# Patient Record
Sex: Male | Born: 1975 | Race: White | Hispanic: Yes | Marital: Single | Smoking: Current some day smoker
Health system: Southern US, Community
[De-identification: ages and names within clinical notes are randomized; demographics above are authoritative.]

## PROBLEM LIST (undated history)

## (undated) DIAGNOSIS — D509 Iron deficiency anemia, unspecified: Secondary | ICD-10-CM

## (undated) DIAGNOSIS — J45909 Unspecified asthma, uncomplicated: Secondary | ICD-10-CM

## (undated) DIAGNOSIS — G4730 Sleep apnea, unspecified: Secondary | ICD-10-CM

## (undated) DIAGNOSIS — K7030 Alcoholic cirrhosis of liver without ascites: Secondary | ICD-10-CM

## (undated) DIAGNOSIS — R740 Nonspecific elevation of levels of transaminase and lactic acid dehydrogenase [LDH]: Secondary | ICD-10-CM

## (undated) DIAGNOSIS — I861 Scrotal varices: Secondary | ICD-10-CM

## (undated) DIAGNOSIS — K700 Alcoholic fatty liver: Secondary | ICD-10-CM

## (undated) DIAGNOSIS — F1020 Alcohol dependence, uncomplicated: Secondary | ICD-10-CM

## (undated) HISTORY — DX: Alcoholic fatty liver: K70.0

## (undated) HISTORY — DX: Sleep apnea, unspecified: G47.30

## (undated) HISTORY — DX: Nonspecific elevation of levels of transaminase and lactic acid dehydrogenase (ldh): R74.0

## (undated) HISTORY — DX: Iron deficiency anemia, unspecified: D50.9

## (undated) HISTORY — DX: Morbid (severe) obesity due to excess calories: E66.01

## (undated) HISTORY — DX: Alcohol dependence, uncomplicated: F10.20

## (undated) HISTORY — DX: Alcoholic cirrhosis of liver without ascites: K70.30

## (undated) HISTORY — DX: Unspecified asthma, uncomplicated: J45.909

## (undated) HISTORY — DX: Scrotal varices: I86.1

---

## 2005-03-14 ENCOUNTER — Encounter: Admission: RE | Admit: 2005-03-14 | Discharge: 2005-03-14 | Payer: Self-pay | Admitting: Gastroenterology

## 2007-06-10 DIAGNOSIS — R7401 Elevation of levels of liver transaminase levels: Secondary | ICD-10-CM

## 2007-06-10 HISTORY — DX: Elevation of levels of liver transaminase levels: R74.01

## 2007-10-10 ENCOUNTER — Emergency Department (HOSPITAL_COMMUNITY): Admission: EM | Admit: 2007-10-10 | Discharge: 2007-10-10 | Payer: Self-pay | Admitting: Emergency Medicine

## 2007-10-12 ENCOUNTER — Encounter: Payer: Self-pay | Admitting: Internal Medicine

## 2007-10-12 ENCOUNTER — Ambulatory Visit: Payer: Self-pay | Admitting: Infectious Disease

## 2007-10-12 DIAGNOSIS — J309 Allergic rhinitis, unspecified: Secondary | ICD-10-CM

## 2007-10-12 LAB — CONVERTED CEMR LAB: Hgb A1c MFr Bld: 4.8 %

## 2007-10-21 LAB — CONVERTED CEMR LAB
ALT: 146 units/L — ABNORMAL HIGH (ref 0–53)
AST: 89 units/L — ABNORMAL HIGH (ref 0–37)
Albumin: 4.8 g/dL (ref 3.5–5.2)
Alkaline Phosphatase: 91 units/L (ref 39–117)
BUN: 13 mg/dL (ref 6–23)
Basophils Absolute: 0 10*3/uL (ref 0.0–0.1)
Basophils Relative: 0 % (ref 0–1)
Bilirubin Urine: NEGATIVE
CO2: 26 meq/L (ref 19–32)
Calcium: 10.1 mg/dL (ref 8.4–10.5)
Chloride: 103 meq/L (ref 96–112)
Cholesterol: 236 mg/dL — ABNORMAL HIGH (ref 0–200)
Creatinine, Ser: 1.03 mg/dL (ref 0.40–1.50)
Eosinophils Absolute: 0 10*3/uL (ref 0.0–0.7)
Eosinophils Relative: 0 % (ref 0–5)
Glucose, Bld: 125 mg/dL — ABNORMAL HIGH (ref 70–99)
HCT: 50.2 % (ref 39.0–52.0)
HDL: 58 mg/dL (ref >39)
Hemoglobin, Urine: NEGATIVE
Hemoglobin: 17.3 g/dL — ABNORMAL HIGH (ref 13.0–17.0)
Ketones, ur: NEGATIVE mg/dL
LDL Cholesterol: 141 mg/dL — ABNORMAL HIGH (ref 0–99)
Leukocytes, UA: NEGATIVE
Lymphocytes Relative: 13 % (ref 12–46)
Lymphs Abs: 1.4 10*3/uL (ref 0.7–4.0)
MCHC: 34.5 g/dL (ref 30.0–36.0)
MCV: 89.2 fL (ref 78.0–100.0)
Monocytes Absolute: 0.3 10*3/uL (ref 0.1–1.0)
Monocytes Relative: 3 % (ref 3–12)
Neutro Abs: 9.7 10*3/uL — ABNORMAL HIGH (ref 1.7–7.7)
Neutrophils Relative %: 85 % — ABNORMAL HIGH (ref 43–77)
Nitrite: NEGATIVE
Platelets: 331 10*3/uL (ref 150–400)
Potassium: 4.7 meq/L (ref 3.5–5.3)
Protein, ur: NEGATIVE mg/dL
RBC: 5.63 M/uL (ref 4.22–5.81)
RDW: 12.8 % (ref 11.5–15.5)
Sodium: 141 meq/L (ref 135–145)
Specific Gravity, Urine: 1.023 (ref 1.005–1.03)
TSH: 0.767 microintl units/mL (ref 0.350–5.50)
Total Bilirubin: 0.7 mg/dL (ref 0.3–1.2)
Total CHOL/HDL Ratio: 4.1
Total Protein: 7.9 g/dL (ref 6.0–8.3)
Triglycerides: 185 mg/dL — ABNORMAL HIGH (ref <150)
Urine Glucose: NEGATIVE mg/dL
Urobilinogen, UA: 1 (ref 0.0–1.0)
VLDL: 37 mg/dL (ref 0–40)
WBC: 11.5 10*3/uL — ABNORMAL HIGH (ref 4.0–10.5)
pH: 7.5 (ref 5.0–8.0)

## 2009-10-03 ENCOUNTER — Telehealth (INDEPENDENT_AMBULATORY_CARE_PROVIDER_SITE_OTHER): Payer: Self-pay | Admitting: *Deleted

## 2009-10-03 ENCOUNTER — Encounter (INDEPENDENT_AMBULATORY_CARE_PROVIDER_SITE_OTHER): Payer: Self-pay | Admitting: *Deleted

## 2010-01-07 DIAGNOSIS — I861 Scrotal varices: Secondary | ICD-10-CM

## 2010-01-07 HISTORY — DX: Scrotal varices: I86.1

## 2010-01-16 ENCOUNTER — Ambulatory Visit: Payer: Self-pay | Admitting: Internal Medicine

## 2010-01-16 DIAGNOSIS — K700 Alcoholic fatty liver: Secondary | ICD-10-CM

## 2010-01-16 DIAGNOSIS — I861 Scrotal varices: Secondary | ICD-10-CM | POA: Insufficient documentation

## 2010-01-16 DIAGNOSIS — K760 Fatty (change of) liver, not elsewhere classified: Secondary | ICD-10-CM | POA: Insufficient documentation

## 2010-01-16 HISTORY — DX: Alcoholic fatty liver: K70.0

## 2010-01-17 ENCOUNTER — Encounter: Payer: Self-pay | Admitting: Internal Medicine

## 2010-01-17 LAB — CONVERTED CEMR LAB
ALT: 136 units/L — ABNORMAL HIGH (ref 0–53)
AST: 80 units/L — ABNORMAL HIGH (ref 0–37)
Albumin: 4.7 g/dL (ref 3.5–5.2)
Alkaline Phosphatase: 87 units/L (ref 39–117)
BUN: 11 mg/dL (ref 6–23)
Basophils Absolute: 0 10*3/uL (ref 0.0–0.1)
Basophils Relative: 0 % (ref 0–1)
CO2: 25 meq/L (ref 19–32)
Calcium: 9.4 mg/dL (ref 8.4–10.5)
Chloride: 102 meq/L (ref 96–112)
Cholesterol: 209 mg/dL — ABNORMAL HIGH (ref 0–200)
Creatinine, Ser: 0.93 mg/dL (ref 0.40–1.50)
Eosinophils Absolute: 0.4 10*3/uL (ref 0.0–0.7)
Eosinophils Relative: 6 % — ABNORMAL HIGH (ref 0–5)
Glucose, Bld: 89 mg/dL (ref 70–99)
HCT: 46.6 % (ref 39.0–52.0)
HCV Ab: NEGATIVE
HDL: 45 mg/dL (ref >39)
Hemoglobin: 16.2 g/dL (ref 13.0–17.0)
Hep A IgM: NEGATIVE
Hep B C IgM: NEGATIVE
Hepatitis B Surface Ag: NEGATIVE
LDL Cholesterol: 135 mg/dL — ABNORMAL HIGH (ref 0–99)
Lymphocytes Relative: 36 % (ref 12–46)
Lymphs Abs: 2.8 10*3/uL (ref 0.7–4.0)
MCHC: 34.8 g/dL (ref 30.0–36.0)
MCV: 89.3 fL (ref >=78.0)
Monocytes Absolute: 0.7 10*3/uL (ref 0.1–1.0)
Monocytes Relative: 8 % (ref 3–12)
Neutro Abs: 3.9 10*3/uL (ref 1.7–7.7)
Neutrophils Relative %: 50 % (ref 43–77)
Platelets: 269 10*3/uL (ref 150–400)
Potassium: 4.4 meq/L (ref 3.5–5.3)
RBC: 5.22 M/uL (ref 4.22–5.81)
RDW: 12.4 % (ref 11.5–15.5)
Sodium: 140 meq/L (ref 135–145)
TSH: 2.136 microintl units/mL (ref 0.350–4.5)
Total Bilirubin: 0.9 mg/dL (ref 0.3–1.2)
Total CHOL/HDL Ratio: 4.6
Total Protein: 7.6 g/dL (ref 6.0–8.3)
Triglycerides: 145 mg/dL (ref <150)
VLDL: 29 mg/dL (ref 0–40)
WBC: 7.7 10*3/uL (ref 4.0–10.5)
aPTT: 31 s

## 2010-01-21 ENCOUNTER — Ambulatory Visit (HOSPITAL_COMMUNITY): Admission: RE | Admit: 2010-01-21 | Discharge: 2010-01-21 | Payer: Self-pay | Admitting: Internal Medicine

## 2010-01-29 ENCOUNTER — Encounter: Payer: Self-pay | Admitting: Internal Medicine

## 2010-07-09 NOTE — Letter (Signed)
Summary: [facility] RECALL LETTER  All     ,     Phone:   Fax:     10/03/2009   [facility] [address] [place], [place]  [address]   Dear  [name],   You are due to follow-up with a doctor at [facility].  We have been unable to contact you by phone.  If you would like to schedule a visit, please call [phone].  If you are receiving your health care somewhere else, please call us and we will take your name off our patient list.  Healthy regards,  Raynaldo Opitz, Director [facility]

## 2010-07-09 NOTE — Assessment & Plan Note (Signed)
Summary: EST-ROUTINE CHECKUP/CH   Vital Signs:  Patient profile:   35 year old male Height:      62 inches (157.48 cm) Weight:      244.8 pounds (111.27 kg) BMI:     44.94 Temp:     97.3 degrees F Pulse rate:   60 / minute BP sitting:   124 / 78  (left arm) Cuff size:   large  Vitals Entered By: Dorie Rank RN (January 16, 2010 4:26 PM) CC: check up Is Patient Diabetic? No Pain Assessment Patient in pain? no      Nutritional Status BMI of > 30 = obese  Does patient need assistance? Functional Status Self care Ambulation Normal   Primary Care :  Vassie Loll MD  CC:  check up.  History of Present Illness: 35 y/o male with pmh as described in the EMR; who comes to the clinic for followup and also to be evaluated for left testis swelling and inttermitent pain.  Patient is currently doing well and denies CP, abdominal pain, nausea, vomiting, or any other complaints.  He endorses some depressed mood and also increased alcohol consumption; he also stop following a low calorie diet and exercising and is currently with aprox 20 pounds weight gain.  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) 4)  Healthy Adult Male  (ICD-V70.0) 5)  Screening For Lipoid Disorders  (ICD-V77.91) 6)  Family History Diabetes 1st Degree Relative  (ICD-V18.0) 7)  Allergic Rhinitis  (ICD-477.9)  Current Problems (verified): 1)  Healthy Adult Male  (ICD-V70.0) 2)  Screening For Lipoid Disorders  (ICD-V77.91) 3)  Family History Diabetes 1st Degree Relative   (ICD-V18.0) 4)  Allergic Rhinitis  (ICD-477.9)  Medications Prior to Update: 1)  None  Current Medications (verified): 1)  None  Allergies (verified): No Known Drug Allergies  Past History:  Past Medical History: Last updated: 10/12/2007 Allergic rhinitis  Family History: Last updated: 10/12/2007 Family History Liver disease (his dad) Family History Diabetes 1st degree relative  Social History: 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 Single hx of ocassionally cigar smoking in the past (quit 2 years ago) Alcohol use-yes Regular exercise-yes  Risk Factors: Caffeine Use: 4-5 (01/16/2010) Exercise: yes (01/16/2010)  Risk Factors: Smoking Status: never (01/16/2010) Passive Smoke Exposure: yes (01/16/2010)  Review of Systems       The patient complains of weight gain and testicular masses.  The patient denies anorexia, fever, chest pain, syncope, dyspnea on exertion, peripheral edema, prolonged cough, hemoptysis, abdominal pain, melena, hematochezia, and severe indigestion/heartburn.    Physical Exam  General:  alert, well-hydrated, appropriate dress, cooperative to examination, good hygiene, and overweight-appearing.   Lungs:  Normal respiratory effort, chest expands symmetrically. Lungs are clear to auscultation, no crackles or wheezes. Heart:  Normal rate and regular rhythm. S1 and S2 normal without gallop, murmur, click, rub or other extra sounds. Abdomen:  Bowel sounds positive,abdomen soft, obese and non-tender,  without masses, organomegaly or hernias noted. Genitalia:  uncircumcised, no cutaneous lesions, no urethral discharge,  and L varicocele.   Extremities:  No clubbing, cyanosis, edema, or deformity noted with normal full range of motion of all joints.   Neurologic:  No cranial nerve deficits noted. Station and gait are normal. Plantar reflexes are down-going bilaterally. DTRs are symmetrical throughout. Sensory, motor and coordinative functions  appear intact.   Impression & Recommendations:  Problem # 1:  TRANSAMINASES, SERUM, ELEVATED (ICD-790.4) Patient with hx of elevated LFT's in  the past and fmh of NASH cirrhosis. Also secondary to alcohol consumption and most likely fatty liver due to obesity. Hepatitis panel was checked today and was negative; LDL was 135 (which is mildly elevated in someone w/o other risk factors other than obesity); patient HIV was also negative. Will recommend alcohol abstiennce over the next 3 months; exercise and low fat diet; at that time will repeat LFT's if still elevated will perform liver US and referred to GI.  Patient coagulation panel is WNL.  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 Due to risk of infertility and also potential risk for malignancy; will make referral to urology for further evaluation and to determine best tx.  Orders: Urology Referral (Urology)  Problem # 3:  SCREENING FOR LIPOID DISORDERS (ICD-V77.91) LDL was repeated this time to check for lipoid disorder. Patient LDL is milddly elevated, but w/o other cormobidities and with elevation on his LFT's; he is not in need neither is a candidate for statins. Will recommend low fat diet and weight loss.  Orders: T-Lipid Profile ([id number])  Problem # 4:  ALLERGIC RHINITIS (ICD-477.9) Patient's rhinitis is stable and well controlled. currently not taking any meds. He is instructed to use zyrtec or loratadine if symptoms return.  Problem # 5:  HEALTH MAINTENANCE EXAM (ICD-V70.0) Will check for HIV status, Thyroid function, Complete blood cell count and also acute hepatitis panel, looking for any viral infection, that could cause his liver abnormalities.  Orders: T-CBC w/Diff  ([id number]) T-HIV Antibody  (Reflex) ([id number]) T-TSH [phone]) T-Hepatitis Acute Panel ([id number])  Patient Instructions: 1)  Please schedule a follow-up appointment in 4 weeks; sooner if needed depending on labs results. 2)  You need to lose weight. Consider a lower calorie diet and regular exercise.  3)  It is not healthy for men to drink more than 2-3 drinks per week. 4)  Start taking a multivitamin daily.  5)  Will call you with appointment details. 6)  You will be called with any abnormalities in the tests scheduled or performed today.  If you don't hear from [place] within a week from when the test was performed, you can assume that your test was normal. Process Orders Check Orders Results:     [facility]: ABN not required for this insurance Tests Sent for requisitioning (January 20, 2010 9:37 PM):     01/16/2010: [facility] -- T-CMP with Estimated GFR [[id number]] (signed)     01/16/2010: [facility] -- T-Lipid Profile [phone] (signed)     01/16/2010: [facility] -- T-CBC w/Diff [[id number]] (signed)     01/16/2010: [facility] -- T-HIV Antibody  (Reflex) [[id number]] (signed)     01/16/2010: [facility] -- T-TSH [[id number]] (signed)     01/16/2010: [facility] -- T-Hepatitis Acute Panel [[id number]] (signed)     01/16/2010: [facility] --  T-PTT [[id number]] (signed)     Prevention & Chronic Care Immunizations   Influenza vaccine: Not documented    Tetanus booster: Not documented    Pneumococcal vaccine: Not documented  Other Screening   Smoking status: never  (01/16/2010)

## 2010-07-09 NOTE — Consult Note (Signed)
[facility]: [facility]   Imported By: Louretta Parma 02/19/2010 16:41:08  _____________________________________________________________________  External Attachment:    Type:   Image     Comment:   External Document

## 2010-07-09 NOTE — Progress Notes (Signed)
  Phone Note Outgoing Call   Call placed by: Gentry Fitz,  October 03, 2009 2:02 PM Call placed to: Patient Summary of Call: We attempted to call patient in order to schedule a return appointment, since his last [facility] office visit was more than one year ago.  Since we were unable to reach patient by phone, a letter was sent asking him to contact us.  Initial call taken by: Gentry Fitz,  October 03, 2009 2:02 PM

## 2010-08-27 ENCOUNTER — Encounter: Payer: Self-pay | Admitting: Ophthalmology

## 2010-12-30 ENCOUNTER — Other Ambulatory Visit: Payer: Self-pay | Admitting: Sports Medicine

## 2010-12-30 DIAGNOSIS — M545 Low back pain: Secondary | ICD-10-CM

## 2010-12-31 ENCOUNTER — Other Ambulatory Visit: Payer: Self-pay

## 2011-12-19 ENCOUNTER — Encounter: Payer: Self-pay | Admitting: Internal Medicine

## 2011-12-22 ENCOUNTER — Encounter: Payer: Self-pay | Admitting: Internal Medicine

## 2011-12-25 ENCOUNTER — Ambulatory Visit (INDEPENDENT_AMBULATORY_CARE_PROVIDER_SITE_OTHER): Payer: 59 | Admitting: Internal Medicine

## 2011-12-25 ENCOUNTER — Encounter: Payer: Self-pay | Admitting: Internal Medicine

## 2011-12-25 VITALS — BP 135/83 | HR 96 | Temp 97.7°F | Ht 65.0 in | Wt 270.7 lb

## 2011-12-25 DIAGNOSIS — F1090 Alcohol use, unspecified, uncomplicated: Secondary | ICD-10-CM | POA: Insufficient documentation

## 2011-12-25 DIAGNOSIS — F17200 Nicotine dependence, unspecified, uncomplicated: Secondary | ICD-10-CM

## 2011-12-25 DIAGNOSIS — F1010 Alcohol abuse, uncomplicated: Secondary | ICD-10-CM

## 2011-12-25 DIAGNOSIS — Z789 Other specified health status: Secondary | ICD-10-CM

## 2011-12-25 DIAGNOSIS — Z0000 Encounter for general adult medical examination without abnormal findings: Secondary | ICD-10-CM | POA: Insufficient documentation

## 2011-12-25 DIAGNOSIS — M79609 Pain in unspecified limb: Secondary | ICD-10-CM

## 2011-12-25 DIAGNOSIS — F1020 Alcohol dependence, uncomplicated: Secondary | ICD-10-CM | POA: Insufficient documentation

## 2011-12-25 HISTORY — DX: Alcohol dependence, uncomplicated: F10.20

## 2011-12-25 LAB — CBC
MCHC: 35.2 g/dL (ref 30.0–36.0)
MCV: 87 fL (ref 78.0–100.0)
Platelets: 277 10*3/uL (ref 150–400)
RBC: 5.29 MIL/uL (ref 4.22–5.81)
RDW: 13.6 % (ref 11.5–15.5)
WBC: 8.3 10*3/uL (ref 4.0–10.5)

## 2011-12-25 NOTE — Assessment & Plan Note (Signed)
-  His influenza vaccination is not due yet. -Will check his CBC today.

## 2011-12-25 NOTE — Progress Notes (Signed)
Patient ID: Thomas [name], male   DOB: June 01, 1976, 36 y.o.   MRN: [id number]  Subjective:   Patient ID: Thomas [name] male   DOB: [birth year] 36 y.o.   MRN: [id number]  HPI: Thomas [name] is a 36 y.o. without a significant past medical history, who presents for a regular checkup.  Patient reports that he has been taking have been in the past. He used to drink 3 times a week, 6 to 12 packs of beer each time. Recently he made some efforts in cutting down his drinking. He is currently drinking 2 times a week, but he still drinks 6 to 12 packs each time. He reports that sometimes he develops joint pain at finger joints and the knee joints after having drinking, his joint pain usually resolves by itself when he stops drinking. He does not have any joint pain today. He had an abnormal liver function tests in the past with AST of 80 and ALT of 136 at 01/17/10. He understands that drinking alcohol can cause damage to his liver. He would like to try to cut it down. Currently he doesn't have symptoms for alcohol intoxication or alcohol withdrawal.   Denies fever, chills, fatigue, headaches,  cough, chest pain, SOB,  abdominal pain,diarrhea, constipation, dysuria, urgency, frequency or hematuria.  Past Medical History  Diagnosis Date  . Allergic rhinitis   . 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   . Elevated transaminase level 2009    AST: 80 ALT: 136 in 8/11: Hepatitis A., B and C negative.    No current outpatient prescriptions on file.   Family History  Problem Relation Age of Onset  . Liver disease Father   . Diabetes Other    History   Social History  . Marital Status: Single    Spouse Name: N/A    Number of Children: N/A  . Years of Education: N/A   Social History Main Topics  . Smoking status: Current Some Day Smoker    Types: Cigars  . Smokeless tobacco: None   Comment: Cigars occasional  . Alcohol Use: Yes     Beer/wine - 3 times a week  . Drug Use: None  .  Sexually Active: None   Other Topics Concern  . None   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    Review of Systems:  General: no fevers, chills, no changes in body weight, no changes in appetite Skin: no rash HEENT: no blurry vision, hearing changes or sore throat Pulm: no dyspnea, coughing, wheezing CV: no chest pain, palpitations, shortness of breath Abd: no nausea/vomiting, abdominal pain, diarrhea/constipation GU: no dysuria, hematuria, polyuria Ext: no arthralgias, myalgias Neuro: no weakness, numbness, or tingling   Objective:  Physical Exam: Filed Vitals:   12/25/11 1529  BP: 135/83  Pulse: 96  Temp: 97.7 F (36.5 C)  TempSrc: Oral  Height: 5\' 5"  (1.651 m)  Weight: 270 lb 11.2 oz (122.789 kg)  SpO2: 96%   General: resting in bed, not in acute distress HEENT: PERRL, EOMI, no scleral icterus Cardiac: S1/S2, RRR, No murmurs, gallops or rubs Pulm: Good air movement bilaterally, Clear to auscultation bilaterally, No rales, wheezing, rhonchi or rubs. Abd: Soft,  nondistended, nontender, no rebound pain, no organomegaly, BS present Ext: No rashes or edema, 2+DP/PT pulse bilaterally Musculoskeletal: No joint deformities, erythema, or stiffness, ROM full and nontender Skin: no rashes. No skin bruise. Neuro: alert and oriented X3, cranial  nerves II-XII grossly intact, muscle strength 5/5 in all extremeties,  sensation to light touch intact.  Psych.: patient is not psychotic, no suicidal or hemocidal ideation.    Assessment & Plan:

## 2011-12-25 NOTE — Patient Instructions (Signed)
It is very important to quit drinking alcohol. If you need any help,  please let us know.   Alcohol Problems Most adults who drink alcohol drink in moderation (not a lot) are at low risk for developing problems related to their drinking. However, all drinkers, including low-risk drinkers, should know about the health risks connected with drinking alcohol. RECOMMENDATIONS FOR LOW-RISK DRINKING  Drink in moderation. Moderate drinking is defined as follows:   Men - no more than 2 drinks per day.   Nonpregnant women - no more than 1 drink per day.   Over age 42 - no more than 1 drink per day.  A standard drink is 12 grams of pure alcohol, which is equal to a 12 ounce bottle of beer or wine cooler, a 5 ounce glass of wine, or 1.5 ounces of distilled spirits (such as whiskey, brandy, vodka, or rum).  ABSTAIN FROM (DO NOT DRINK) ALCOHOL:  When pregnant or considering pregnancy.   When taking a medication that interacts with alcohol.   If you are alcohol dependent.   A medical condition that prohibits drinking alcohol (such as ulcer, liver disease, or heart disease).  DISCUSS WITH YOUR CAREGIVER:  If you are at risk for coronary heart disease, discuss the potential benefits and risks of alcohol use: Light to moderate drinking is associated with lower rates of coronary heart disease in certain populations (for example, men over age 75 and postmenopausal women). Infrequent or nondrinkers are advised not to begin light to moderate drinking to reduce the risk of coronary heart disease so as to avoid creating an alcohol-related problem. Similar protective effects can likely be gained through proper diet and exercise.   Women and the elderly have smaller amounts of body water than men. As a result women and the elderly achieve a higher blood alcohol concentration after drinking the same amount of alcohol.   Exposing a fetus to alcohol can cause a broad range of birth defects referred to as Fetal  Alcohol Syndrome (FAS) or Alcohol-Related Birth Defects (ARBD). Although FAS/ARBD is connected with excessive alcohol consumption during pregnancy, studies also have reported neurobehavioral problems in infants born to mothers reporting drinking an average of 1 drink per day during pregnancy.   Heavier drinking (the consumption of more than 4 drinks per occasion by men and more than 3 drinks per occasion by women) impairs learning (cognitive) and psychomotor functions and increases the risk of alcohol-related problems, including accidents and injuries.  CAGE QUESTIONS:   Have you ever felt that you should Cut down on your drinking?   Have people Annoyed you by criticizing your drinking?   Have you ever felt bad or Guilty about your drinking?   Have you ever had a drink first thing in the morning to steady your nerves or get rid of a hangover (Eye opener)?  If you answered positively to any of these questions: You may be at risk for alcohol-related problems if alcohol consumption is:   Men: Greater than 14 drinks per week or more than 4 drinks per occasion.   Women: Greater than 7 drinks per week or more than 3 drinks per occasion.  Do you or your family have a medical history of alcohol-related problems, such as:  Blackouts.   Sexual dysfunction.   Depression.   Trauma.   Liver dysfunction.   Sleep disorders.   Hypertension.   Chronic abdominal pain.   Has your drinking ever caused you problems, such as problems with your family, problems  with your work (or school) performance, or accidents/injuries?   Do you have a compulsion to drink or a preoccupation with drinking?   Do you have poor control or are you unable to stop drinking once you have started?   Do you have to drink to avoid withdrawal symptoms?   Do you have problems with withdrawal such as tremors, nausea, sweats, or mood disturbances?   Does it take more alcohol than in the past to get you high?   Do you  feel a strong urge to drink?   Do you change your plans so that you can have a drink?   Do you ever drink in the morning to relieve the shakes or a hangover?  If you have answered a number of the previous questions positively, it may be time for you to talk to your caregivers, family, and friends and see if they think you have a problem. Alcoholism is a chemical dependency that keeps getting worse and will eventually destroy your health and relationships. Many alcoholics end up dead, impoverished, or in prison. This is often the end result of all chemical dependency.  Do not be discouraged if you are not ready to take action immediately.   Decisions to change behavior often involve up and down desires to change and feeling like you cannot decide.   Try to think more seriously about your drinking behavior.   Think of the reasons to quit.  WHERE TO GO FOR ADDITIONAL INFORMATION   The National Institute on Alcohol Abuse and Alcoholism (NIAAA)www.niaaa.nih.gov   [employer] on Alcoholism and Drug Dependence (NCADD)www.ncadd.org   American Society of Addiction Medicine (ASAM)www.https://anderson-johnson.com/  Document Released: 05/26/2005 Document Revised: 05/15/2011 Document Reviewed: 01/12/2008 [facility] Patient Information 2012 [place], [place].

## 2011-12-25 NOTE — Assessment & Plan Note (Addendum)
Patient is a current heavy drinker of alcohol. Although he has cut it down on his drinking recently, he still drinks 3 times a week, 6-12 packs of beer each time. Currently he doesn't have symptoms for alcohol intoxication or alcohol withdrawal. I spend nearly 20 min in counseling him for quitting alcohol. He appreciate that. He agreed to try to cut down on his drinking and gradually stop drinking alcohol. He had an abnormal transaminases in the past. Will check his CMP today.

## 2011-12-26 LAB — COMPLETE METABOLIC PANEL WITH GFR
ALT: 122 U/L — ABNORMAL HIGH (ref 0–53)
AST: 73 U/L — ABNORMAL HIGH (ref 0–37)
Albumin: 4.2 g/dL (ref 3.5–5.2)
BUN: 12 mg/dL (ref 6–23)
CO2: 26 mEq/L (ref 19–32)
Calcium: 9.6 mg/dL (ref 8.4–10.5)
Chloride: 105 mEq/L (ref 96–112)
Creat: 0.84 mg/dL (ref 0.50–1.35)
GFR, Est African American: >89 mL/min
Glucose, Bld: 90 mg/dL (ref 70–99)
Total Bilirubin: 0.6 mg/dL (ref 0.3–1.2)
Total Protein: 7.1 g/dL (ref 6.0–8.3)

## 2012-03-29 ENCOUNTER — Ambulatory Visit: Payer: 59 | Admitting: Internal Medicine

## 2012-04-26 ENCOUNTER — Ambulatory Visit: Payer: 59 | Admitting: Internal Medicine

## 2012-04-27 ENCOUNTER — Encounter: Payer: Self-pay | Admitting: Internal Medicine

## 2012-04-27 ENCOUNTER — Ambulatory Visit (INDEPENDENT_AMBULATORY_CARE_PROVIDER_SITE_OTHER): Payer: 59 | Admitting: Dietician

## 2012-04-27 ENCOUNTER — Ambulatory Visit (INDEPENDENT_AMBULATORY_CARE_PROVIDER_SITE_OTHER): Payer: 59 | Admitting: Internal Medicine

## 2012-04-27 VITALS — BP 142/87 | HR 96 | Temp 98.2°F | Ht 65.0 in | Wt 282.8 lb

## 2012-04-27 DIAGNOSIS — Z0000 Encounter for general adult medical examination without abnormal findings: Secondary | ICD-10-CM

## 2012-04-27 DIAGNOSIS — K700 Alcoholic fatty liver: Secondary | ICD-10-CM

## 2012-04-27 DIAGNOSIS — F1020 Alcohol dependence, uncomplicated: Secondary | ICD-10-CM

## 2012-04-27 DIAGNOSIS — Z23 Encounter for immunization: Secondary | ICD-10-CM

## 2012-04-27 NOTE — Progress Notes (Signed)
  Subjective:    Patient ID: Thomas [name], male    DOB: Jul 06, 1975, 36 y.o.   MRN: [id number]  CC: follow up, alcoholism  HPI:  This is a 36 year old man who has alcoholism but is otherwise healthy.  He was counseled extensively by Dr. Clyde Lundborg in July about the dangers of chronic alcoholism and was provided community resources for cessation.  He drinks only when he goes out with friends, which was about 3 night a week, but he has been cutting this down to 1 night per week.  He does not particularly crave alcohol, but when he is with his friends and he starts drinking, he cannot stop and he over does it.  He is without symptoms today.    Review of Systems     Objective:   Physical Exam GENERAL: obese; no acute distress EXTREMITIES: 1+ pitting edema PSYCH: patient is alert and oriented, mood and affect are normal and congruent, thought content is normal without delusions, thought process is linear, speech is normal and non-pressured, behavior is normal, judgement and insight are normal   Filed Vitals:   04/27/12 1546  BP: 142/87  Pulse: 96  Temp: 98.2 F (36.8 C)     CBC Component Value Date/Time   WBC 8.3 12/25/2011 1630   RBC 5.29 12/25/2011 1630   HGB 16.2 12/25/2011 1630   HCT 46.0 12/25/2011 1630   PLT 277 12/25/2011 1630   MCV 87.0 12/25/2011 1630   MCH 30.6 12/25/2011 1630   MCHC 35.2 12/25/2011 1630   RDW 13.6 12/25/2011 1630    CMP  Component Value Date/Time   NA 141 12/25/2011 1630   K 4.1 12/25/2011 1630   CL 105 12/25/2011 1630   CO2 26 12/25/2011 1630   GLUCOSE 90 12/25/2011 1630   BUN 12 12/25/2011 1630   CREATININE 0.84 12/25/2011 1630   CALCIUM 9.6 12/25/2011 1630   PROT 7.1 12/25/2011 1630   ALBUMIN 4.2 12/25/2011 1630   AST 73* 12/25/2011 1630   ALT 122* 12/25/2011 1630   ALKPHOS 90 12/25/2011 1630   BILITOT 0.6 12/25/2011 1630       Assessment & Plan:

## 2012-04-27 NOTE — Assessment & Plan Note (Addendum)
Refused flu shot.  Gave Tdap today. I had him meet with Norm Parcel today for health diet and low salt diet education.

## 2012-04-27 NOTE — Assessment & Plan Note (Signed)
I again counseled this patient on the dangers of continued heavy drinking.  I suggested that for him, complete abstinence may be required.  I again provided him with community resources for cessation.  We will vaccinate for HAV and HBV beginning with this visit.

## 2012-04-27 NOTE — Assessment & Plan Note (Addendum)
Counseled on quitting.  Because he has chronic liver disease and he has demonstrated an absence of viral hepatitis immunogenicity, we will begin the hepatitis A and B vaccination cycles today (the orders for all vaccines were placed as future orders in December and May). - HAV vaccine today and in 6 months - HBV vaccine today, next month, and in 6 months

## 2012-04-27 NOTE — Patient Instructions (Addendum)
Hepatitis A and B vaccines were given today.  Your next hepatitis B vaccine will be due next month.  Then you will receive your final vaccine of hepatitis A and B in 6 months.  Substance Abuse Resources: - [facility] Drug [facility]  [phone] - [facility] [phone] - [facility] [phone] Floydene Flock [phone] - Residential & Outpatient Substance Abuse Program  [phone]

## 2012-04-28 NOTE — Progress Notes (Signed)
Spoke to patient about weight loss after assessing his goals, motivation for weight loss, weight history and previous weight loss attempts.  Suggested he contact insurance to find out coverage for Medical Nutrition Therapy-(MNT)  suggested at a minimum 3-4 visits in a 3-6 month period of time.  Encouraged low fat diet with noted liver disease.   Provided written information on MNT codes and starting weight loss( keeping a food record). He is considered class 3 obesity with BMI of 47. May wish to  consider weight loss medications in addition to MNT.

## 2012-05-27 ENCOUNTER — Ambulatory Visit: Payer: 59 | Admitting: *Deleted

## 2012-05-27 DIAGNOSIS — Z0000 Encounter for general adult medical examination without abnormal findings: Secondary | ICD-10-CM

## 2012-05-31 ENCOUNTER — Encounter: Payer: Self-pay | Admitting: *Deleted

## 2012-05-31 ENCOUNTER — Telehealth: Payer: Self-pay | Admitting: *Deleted

## 2012-05-31 NOTE — Telephone Encounter (Signed)
Merrie Roof, RN, tried to contact pt on both his home and cell phones on 05/28/12 and left messages on both again today 05/31/2012 re: his 05/27/2012 Hep B vaccine . Letter also sent today to pt's home instructing him to contact [facility] clinic for follow up to that vaccine. Dorie Rank, RN, 05/31/2012, 10:44A

## 2012-05-31 NOTE — Progress Notes (Signed)
Patient ID: Thomas [name], male   DOB: [birth year], 36 y.o.   MRN: [id number] Attempted to contact pt both 05/28/2012 and 05/31/2012 - see 05/31/2012 telephone note. Dorie Rank, RN

## 2012-06-04 ENCOUNTER — Telehealth: Payer: Self-pay | Admitting: *Deleted

## 2012-06-04 NOTE — Telephone Encounter (Signed)
Spoke w/ pt at 1731, ask him to come to clinic this pm, he states it would be after 1830 if he came today, i explained he would need to get a complete new dose, he is agreeable and will come in mon at lunch

## 2012-06-04 NOTE — Telephone Encounter (Signed)
Left message for return call.

## 2012-06-07 ENCOUNTER — Ambulatory Visit (INDEPENDENT_AMBULATORY_CARE_PROVIDER_SITE_OTHER): Payer: 59 | Admitting: *Deleted

## 2012-06-07 DIAGNOSIS — Z23 Encounter for immunization: Secondary | ICD-10-CM

## 2012-06-07 DIAGNOSIS — Z0000 Encounter for general adult medical examination without abnormal findings: Secondary | ICD-10-CM

## 2012-08-18 ENCOUNTER — Encounter: Payer: Self-pay | Admitting: Internal Medicine

## 2012-08-18 ENCOUNTER — Other Ambulatory Visit: Payer: Self-pay | Admitting: Internal Medicine

## 2012-08-18 DIAGNOSIS — K700 Alcoholic fatty liver: Secondary | ICD-10-CM

## 2012-10-25 ENCOUNTER — Ambulatory Visit: Payer: 59

## 2013-02-11 ENCOUNTER — Ambulatory Visit: Payer: 59 | Admitting: Family Medicine

## 2013-02-21 ENCOUNTER — Ambulatory Visit: Payer: 59 | Admitting: Family Medicine

## 2013-06-06 DIAGNOSIS — F1020 Alcohol dependence, uncomplicated: Secondary | ICD-10-CM | POA: Insufficient documentation

## 2013-06-06 DIAGNOSIS — Z8719 Personal history of other diseases of the digestive system: Secondary | ICD-10-CM | POA: Insufficient documentation

## 2013-06-06 DIAGNOSIS — F17200 Nicotine dependence, unspecified, uncomplicated: Secondary | ICD-10-CM | POA: Insufficient documentation

## 2013-06-06 DIAGNOSIS — IMO0001 Reserved for inherently not codable concepts without codable children: Secondary | ICD-10-CM | POA: Insufficient documentation

## 2013-06-06 DIAGNOSIS — R51 Headache: Secondary | ICD-10-CM | POA: Insufficient documentation

## 2013-06-06 DIAGNOSIS — J3489 Other specified disorders of nose and nasal sinuses: Secondary | ICD-10-CM | POA: Insufficient documentation

## 2013-06-06 DIAGNOSIS — R0602 Shortness of breath: Secondary | ICD-10-CM | POA: Insufficient documentation

## 2013-06-06 DIAGNOSIS — Z79899 Other long term (current) drug therapy: Secondary | ICD-10-CM | POA: Insufficient documentation

## 2013-06-06 DIAGNOSIS — J309 Allergic rhinitis, unspecified: Secondary | ICD-10-CM | POA: Insufficient documentation

## 2013-06-07 ENCOUNTER — Emergency Department (HOSPITAL_COMMUNITY)
Admission: EM | Admit: 2013-06-07 | Discharge: 2013-06-07 | Disposition: A | Discharge status: Home / Self Care | Payer: 59 | Attending: Emergency Medicine | Admitting: Emergency Medicine

## 2013-06-07 ENCOUNTER — Encounter (HOSPITAL_COMMUNITY): Payer: Self-pay | Admitting: Emergency Medicine

## 2013-06-07 ENCOUNTER — Emergency Department (HOSPITAL_COMMUNITY): Payer: 59

## 2013-06-07 DIAGNOSIS — R0981 Nasal congestion: Secondary | ICD-10-CM

## 2013-06-07 MED ORDER — GUAIFENESIN ER 600 MG PO TB12: 1200.0000 mg | ORAL_TABLET | Freq: Two times a day (BID) | ORAL | Status: DC

## 2013-06-07 MED ORDER — CETIRIZINE-PSEUDOEPHEDRINE ER 5-120 MG PO TB12: 1.0000 | ORAL_TABLET | Freq: Two times a day (BID) | ORAL | Status: DC

## 2013-06-07 MED ORDER — OXYMETAZOLINE HCL 0.05 % NA SOLN
1.0000 | Freq: Once | NASAL | Status: AC
  Administered 2013-06-07: 1 via NASAL
  Filled 2013-06-07: qty 15

## 2013-06-07 NOTE — ED Notes (Signed)
Pt. reports persistent dry cough , nasal congestion , chest congestion onset this morning unrelieved by OTC cold medications.

## 2013-06-08 NOTE — ED Provider Notes (Signed)
CSN: [id number]     Arrival date & time 06/06/13  2356 History   First MD Initiated Contact with Patient 06/07/13 0204     Chief Complaint  Patient presents with  . Cough   HPI  History provided by the patient. Patient is a 37 year old male with history of seasonal allergies who presents with symptoms of nasal congestion and cough. He patient states that symptoms began in the morning and have been worsening throughout the day. They're associated with some mild headache and general body aches. He denies having any productive cough. No hemoptysis. Denies fever, chills or sweats. He has been taking over-the-counter cough and cold medicines has not noticed any significant improvement. No recent travel. Denies any specific known sick contacts. No other aggravating or alleviating factors. No other associated symptoms.     Past Medical History  Diagnosis Date  . Allergic rhinitis   . 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   . Elevated transaminase level 2009    AST: 80 ALT: 136 in 8/11: Hepatitis A., B and C negative.   . Alcoholism 12/25/2011  . Alcoholic fatty liver 01/16/2010    Needs final HBV and HAV vaccines on or after 10/25/2012    History reviewed. No pertinent past surgical history. Family History  Problem Relation Age of Onset  . Liver disease Father   . Diabetes Other    History  Substance Use Topics  . Smoking status: Current Some Day Smoker    Types: Cigars  . Smokeless tobacco: Not on file     Comment: Cigars occasional  . Alcohol Use: Yes     Comment: Beer/wine - 3 times a week    Review of Systems  Constitutional: Negative for fever, chills and diaphoresis.  HENT: Positive for congestion.   Respiratory: Positive for cough.   Gastrointestinal: Negative for vomiting and diarrhea.  Musculoskeletal: Positive for myalgias.  Neurological: Positive for headaches.  All other systems reviewed and are negative.    Allergies  Review of patient's  allergies indicates no known allergies.  Home Medications   Current Outpatient Rx  Name  Route  Sig  Dispense  Refill  . cetirizine-pseudoephedrine (ZYRTEC-D) 5-120 MG per tablet   Oral   Take 1 tablet by mouth 2 (two) times daily.   30 tablet   0   . guaiFENesin (MUCINEX) 600 MG 12 hr tablet   Oral   Take 2 tablets (1,200 mg total) by mouth 2 (two) times daily.   40 tablet   0    BP 196/97  Pulse 80  Temp(Src) 99 F (37.2 C) (Oral)  Resp 20  SpO2 98% Physical Exam  Nursing note and vitals reviewed. Constitutional: He is oriented to person, place, and time. He appears well-developed and well-nourished.  HENT:  Head: Normocephalic and atraumatic.  Mouth/Throat: Oropharynx is clear and moist.  Edema of nasal mucosa bilaterally. There is poor air movement. Nasal congestion.  Neck: Normal range of motion. Neck supple.  No meningeal signs  Cardiovascular: Normal rate and regular rhythm.   Pulmonary/Chest: Effort normal and breath sounds normal. No respiratory distress. He has no wheezes. He has no rales.  Abdominal: Soft. There is no tenderness.  Neurological: He is alert and oriented to person, place, and time.  Skin: Skin is warm. No rash noted.  Psychiatric: He has a normal mood and affect. His behavior is normal.    ED Course  Procedures   Patient seen and evaluated. He appears  well normal respirations and O2 sats. Afebrile. X-rays reviewed without any concerning lung process. No signs of pneumonia. At this time suspect cough related to congestion and postnasal drip. Plan to treat symptomatically. Patient agrees.   Imaging Review Dg Chest 2 View  06/07/2013   CLINICAL DATA:  Cough, shortness of breath, and body aches for 1 day.  EXAM: CHEST  2 VIEW  COMPARISON:  None.  FINDINGS: The heart size and mediastinal contours are within normal limits. Both lungs are clear. The visualized skeletal structures are unremarkable.  IMPRESSION: No active cardiopulmonary disease.    Electronically Signed   By: Burman Nieves M.D.   On: 06/07/2013 01:17     MDM   1. Nasal congestion        Angus Seller, PA-C 06/08/13 2103

## 2013-06-09 NOTE — ED Provider Notes (Signed)
Medical screening examination/treatment/procedure(s) were performed by non-physician practitioner and as supervising physician I was immediately available for consultation/collaboration.    Teressa Lower, MD 06/09/13 [phone]

## 2013-06-15 ENCOUNTER — Telehealth: Payer: Self-pay | Admitting: *Deleted

## 2013-06-15 NOTE — Telephone Encounter (Signed)
Pt called stating he was seen in ED on 12/30 and was told to call us if not better. He was given nasal spray, zyrtec -d, and mucinex. He is c/o SOB, not sleeping, anxiety, mucous, and sinus pressure. He has stopped both meds - Zyrtec and mucinex. Sinus pressure is better, and SOB improved.  Pt still not sleeping well, anxiety present and mucous.  I gave him a f/u appointment for tomorrow at 8:45. He wants to know what else he needs to do tonight. Any suggestions?  Pt # [id number]

## 2013-06-15 NOTE — Telephone Encounter (Signed)
Would request that pt keep appt. For tonight, can use steam - hot shower or boil water and put head over steam and towel over head. Can try decongestant for mucous.

## 2013-06-15 NOTE — Telephone Encounter (Signed)
Pt informed

## 2013-06-16 ENCOUNTER — Ambulatory Visit (INDEPENDENT_AMBULATORY_CARE_PROVIDER_SITE_OTHER): Payer: 59 | Admitting: Internal Medicine

## 2013-06-16 ENCOUNTER — Encounter: Payer: Self-pay | Admitting: Internal Medicine

## 2013-06-16 VITALS — BP 139/83 | HR 96 | Temp 99.0°F | Ht 65.0 in | Wt 297.1 lb

## 2013-06-16 DIAGNOSIS — J019 Acute sinusitis, unspecified: Secondary | ICD-10-CM

## 2013-06-16 DIAGNOSIS — G4700 Insomnia, unspecified: Secondary | ICD-10-CM

## 2013-06-16 HISTORY — DX: Acute sinusitis, unspecified: J01.90

## 2013-06-16 MED ORDER — AMOXICILLIN-POT CLAVULANATE 875-125 MG PO TABS: 1.0000 | ORAL_TABLET | Freq: Two times a day (BID) | ORAL | Status: DC

## 2013-06-16 NOTE — Assessment & Plan Note (Addendum)
Likely exacerbated by acute illness.  EtOH also likely contributes.  Finally, he brings up the possibility of sleep apnea, which is likely given his weight. He denies usual feelings of depression/anxiety, with the exception of recent panic attack (first time). To be followed at routine visit with PCP once acute illness resolved.

## 2013-06-16 NOTE — Assessment & Plan Note (Signed)
Symptoms started on 06/07/13, and seem to be waxing and waning.  Start be continued symptomatic treatment with OTC decongestants, but add nasal saline in b/l nares TID and steam therapy. If no better by this weekend, he will start Augmentin 875 BID x 7d (sent to [facility], asked him to fill only if needed).

## 2013-06-16 NOTE — Patient Instructions (Signed)
-You sinusitis - many times, this is viral and will clear on its own without antibiotics.  Please try nasal saline (any brand, over the counter) three times daily in each nostril.   -I am sending antibiotics (Augmentin twice a day x 7 days) to your pharmacy, but only fill this medication if you get worse or feel no better by early next week -You many continue symptomatic therapy with decongestant medications and steam -Please schedule follow up with your primary care doctor to discuss your possible sleep apnea   Please be sure to bring all of your medications with you to every visit.  Should you have any new or worsening symptoms, please be sure to call the clinic at [phone]. Sinusitis Sinusitis is redness, soreness, and swelling (inflammation) of the paranasal sinuses. Paranasal sinuses are air pockets within the bones of your face (beneath the eyes, the middle of the forehead, or above the eyes). In healthy paranasal sinuses, mucus is able to drain out, and air is able to circulate through them by way of your nose. However, when your paranasal sinuses are inflamed, mucus and air can become trapped. This can allow bacteria and other germs to grow and cause infection. Sinusitis can develop quickly and last only a short time (acute) or continue over a long period (chronic). Sinusitis that lasts for more than 12 weeks is considered chronic.  CAUSES  Causes of sinusitis include:  Allergies.  Structural abnormalities, such as displacement of the cartilage that separates your nostrils (deviated septum), which can decrease the air flow through your nose and sinuses and affect sinus drainage.  Functional abnormalities, such as when the small hairs (cilia) that line your sinuses and help remove mucus do not work properly or are not present. SYMPTOMS  Symptoms of acute and chronic sinusitis are the same. The primary symptoms are pain and pressure around the affected sinuses. Other symptoms  include:  Upper toothache.  Earache.  Headache.  Bad breath.  Decreased sense of smell and taste.  A cough, which worsens when you are lying flat.  Fatigue.  Fever.  Thick drainage from your nose, which often is green and may contain pus (purulent).  Swelling and warmth over the affected sinuses. DIAGNOSIS  Your caregiver will perform a physical exam. During the exam, your caregiver may:  Look in your nose for signs of abnormal growths in your nostrils (nasal polyps).  Tap over the affected sinus to check for signs of infection.  View the inside of your sinuses (endoscopy) with a special imaging device with a light attached (endoscope), which is inserted into your sinuses. If your caregiver suspects that you have chronic sinusitis, one or more of the following tests may be recommended:  Allergy tests.  Nasal culture A sample of mucus is taken from your nose and sent to a lab and screened for bacteria.  Nasal cytology A sample of mucus is taken from your nose and examined by your caregiver to determine if your sinusitis is related to an allergy. TREATMENT  Most cases of acute sinusitis are related to a viral infection and will resolve on their own within 10 days. Sometimes medicines are prescribed to help relieve symptoms (pain medicine, decongestants, nasal steroid sprays, or saline sprays).  However, for sinusitis related to a bacterial infection, your caregiver will prescribe antibiotic medicines. These are medicines that will help kill the bacteria causing the infection.  Rarely, sinusitis is caused by a fungal infection. In theses cases, your caregiver will prescribe antifungal  medicine. For some cases of chronic sinusitis, surgery is needed. Generally, these are cases in which sinusitis recurs more than 3 times per year, despite other treatments. HOME CARE INSTRUCTIONS   Drink plenty of water. Water helps thin the mucus so your sinuses can drain more easily.  Use a  humidifier.  Inhale steam 3 to 4 times a day (for example, sit in the bathroom with the shower running).  Apply a warm, moist washcloth to your face 3 to 4 times a day, or as directed by your caregiver.  Use saline nasal sprays to help moisten and clean your sinuses.  Take over-the-counter or prescription medicines for pain, discomfort, or fever only as directed by your caregiver. SEEK IMMEDIATE MEDICAL CARE IF:  You have increasing pain or severe headaches.  You have nausea, vomiting, or drowsiness.  You have swelling around your face.  You have vision problems.  You have a stiff neck.  You have difficulty breathing. MAKE SURE YOU:   Understand these instructions.  Will watch your condition.  Will get help right away if you are not doing well or get worse. Document Released: 05/26/2005 Document Revised: 08/18/2011 Document Reviewed: 06/10/2011 [facility] Patient Information 2014 [place], [place].

## 2013-06-16 NOTE — Progress Notes (Signed)
Case discussed with Dr. Sharda soon after the resident saw the patient.  We reviewed the resident's history and exam and pertinent patient test results.  I agree with the assessment, diagnosis, and plan of care documented in the resident's note. 

## 2013-06-16 NOTE — Progress Notes (Signed)
Subjective:   Patient ID: Thomas [name] male   DOB: 1976-02-24 38 y.o.   MRN: [id number]  Chief Complaint  Patient presents with  . Follow-up    [facility] ER -  still having problems with nasal congestion and pressure. Problems sleeping.    HPI: Mr.Thomas [name] is a 38 y.o. man with history of alcoholic fatty liver and alcoholism who presents today for an acute visit for the above reason.   He was seen in the ED on 06/07/13 with a nonproductive cough & nasal congestion that began on that day and worsened through the day, associated with mild HA and body aches (though he denies this today).  At that time, he was without fever, chills & sweats, and had tried OTC cough/cold medications without improvement. No recent travel or sick contacts.  CXR done at that visit was not concerning, and cough was thought to be d/t congestion and post nasal drip.  In the ED, he was treated with Afrin and discharged with guaifenesin & zyrtec-D.  Did use Afrin for 3 days.   Since ED visit, he continues to c/o SOB, insomnia, anxiety, mucus production (rhinorrhea, now clear with hint of blood, but had been greenish/brown) and sinus pressure with continued dry cough.  He has stopped both zyrtec & mucinex because it dried everything up after the first week, but he feels that it was almost too much, and woke up Sunday with nasal cavities puffed up, so Monday started advil cold/congestion.  By Tuesday, ears felt congested.  Yesterday the swelling started to improve, and nasal passages do not hurt anymore, though thick mucus is back.  He called clinic yesterday, and was advised to try steam and a decongestant.  No fever. Able to stay hydrated.  No sore throat.  Has tried nasal saline with relief (not regularly).   Prior to ED visit, hadn't felt congested in ages.  Tuesday night, felt he had an anxiety attack after taking a prescribed cough syrup (to his mom) that had a narcotic.  He has smoked cigars in the past, but not  recently.  He admits to drinking quite a bit on the weekends.  Has not been flu vaccinated.  To note, he was last seen in clinic on 04/27/12 for a routine visit.  Reports asthma as a child. History of cocaine (last used 2 months ago)  Review of Systems: Constitutional: Denies fever, diaphoresis, appetite change and fatigue. +chills HEENT: Denies photophobia, eye pain, redness, sneezing, mouth sores, neck pain, neck stiffness and tinnitus.  Respiratory:per HPI  Cardiovascular: Denies chest pain and leg swelling. Feels that he has an elevated HR occasionally  Gastrointestinal: Denies nausea, vomiting, abdominal pain, diarrhea, constipation,blood in stool and abdominal distention.  Genitourinary: Denies dysuria, urgency, frequency, hematuria, flank pain and difficulty urinating.  Musculoskeletal: Denies myalgias, back pain, joint swelling, arthralgias and gait problem.  Skin: Denies pallor, rash and wound.  Neurological: Denies dizziness, seizures, syncope, weakness, lightheadedness. +fingertip numbness & HA (mild, throbbing) Psychiatric/Behavioral: drinks excessively due to social reasons, doesn't think of drinking daily, not anxious usually  Past Medical History  Diagnosis Date  . Allergic rhinitis   . 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   . Elevated transaminase level 2009    AST: 80 ALT: 136 in 8/11: Hepatitis A., B and C negative.   . Alcoholism 12/25/2011  . Alcoholic fatty liver 6/71/2458    Needs final HBV and HAV vaccines on or after 10/25/2012  Current Outpatient Prescriptions  Medication Sig Dispense Refill  . cetirizine-pseudoephedrine (ZYRTEC-D) 5-120 MG per tablet Take 1 tablet by mouth 2 (two) times daily.  30 tablet  0  . guaiFENesin (MUCINEX) 600 MG 12 hr tablet Take 2 tablets (1,200 mg total) by mouth 2 (two) times daily.  40 tablet  0   No current facility-administered medications for this visit.   Family History  Problem Relation Age of Onset    . Liver disease Father   . Diabetes Other    History   Social History  . Marital Status: Single    Spouse Name: N/A    Number of Children: N/A  . Years of Education: N/A   Social History Main Topics  . Smoking status: Current Some Day Smoker    Types: Cigars  . Smokeless tobacco: Not on file     Comment: Cigars occasional  . Alcohol Use: Yes     Comment: Beer/wine - 3 times a week  . Drug Use: Not on file  . Sexual Activity: Not on file   Other Topics Concern  . Not on file   Social History Narrative   Scientist, research (medical).  Single. Gets regular exercise.              Objective:  Physical Exam: Filed Vitals:   06/16/13 0901  BP: 139/83  Pulse: 96  Temp: 99 F (37.2 C)  TempSrc: Oral  Height: 5\' 5"  (1.651 m)  Weight: 297 lb 1.6 oz (134.764 kg)  SpO2: 95%   General: pleasant, appears as stated age HEENT: PERRL, EOMI, no scleral icterus, no sinus tenderness, no pharyngeal erythema or post nasal drip visible, +inflamed nasal turbinates Cardiac: RRR, no rubs, murmurs or gallops Pulm: clear to auscultation bilaterally, moving normal volumes of air Abd: soft, obese, nontender, nondistended, BS normoactive Ext: warm and well perfused, no pedal edema Neuro: alert and oriented X3, cranial nerves II-XII grossly intact  Assessment & Plan:  Case and care discussed with Dr. Lynnae January.  Please see problem oriented charting for further details. Patient to return in as soon as possible for a routine visit with PCP once acute issue resolved.

## 2013-06-22 ENCOUNTER — Ambulatory Visit (INDEPENDENT_AMBULATORY_CARE_PROVIDER_SITE_OTHER): Payer: 59 | Admitting: Internal Medicine

## 2013-06-22 ENCOUNTER — Encounter: Payer: Self-pay | Admitting: Internal Medicine

## 2013-06-22 VITALS — BP 127/78 | HR 78 | Temp 98.4°F | Ht 65.0 in | Wt 294.5 lb

## 2013-06-22 DIAGNOSIS — G4700 Insomnia, unspecified: Secondary | ICD-10-CM

## 2013-06-22 DIAGNOSIS — G4733 Obstructive sleep apnea (adult) (pediatric): Secondary | ICD-10-CM

## 2013-06-22 LAB — CBC WITH DIFFERENTIAL/PLATELET
Basophils Absolute: 0 10*3/uL (ref 0.0–0.1)
Basophils Relative: 0 % (ref 0–1)
Eosinophils Absolute: 0.3 10*3/uL (ref 0.0–0.7)
Eosinophils Relative: 4 % (ref 0–5)
HCT: 46.4 % (ref 39.0–52.0)
Hemoglobin: 16.5 g/dL (ref 13.0–17.0)
Lymphocytes Relative: 26 % (ref 12–46)
Lymphs Abs: 1.8 10*3/uL (ref 0.7–4.0)
MCH: 30.7 pg (ref 26.0–34.0)
MCHC: 35.6 g/dL (ref 30.0–36.0)
MCV: 86.4 fL (ref 78.0–100.0)
Monocytes Absolute: 0.6 10*3/uL (ref 0.1–1.0)
Monocytes Relative: 8 % (ref 3–12)
Neutro Abs: 4.2 10*3/uL (ref 1.7–7.7)
Neutrophils Relative %: 62 % (ref 43–77)
Platelets: 258 10*3/uL (ref 150–400)
RBC: 5.37 MIL/uL (ref 4.22–5.81)
RDW: 14 % (ref 11.5–15.5)
WBC: 6.9 10*3/uL (ref 4.0–10.5)

## 2013-06-22 LAB — COMPLETE METABOLIC PANEL WITH GFR
ALT: 93 U/L — ABNORMAL HIGH (ref 0–53)
AST: 54 U/L — ABNORMAL HIGH (ref 0–37)
Albumin: 4.3 g/dL (ref 3.5–5.2)
Alkaline Phosphatase: 87 U/L (ref 39–117)
BUN: 11 mg/dL (ref 6–23)
CO2: 31 mEq/L (ref 19–32)
Calcium: 9.4 mg/dL (ref 8.4–10.5)
Chloride: 102 mEq/L (ref 96–112)
Creat: 0.75 mg/dL (ref 0.50–1.35)
GFR, Est African American: >89 mL/min
GFR, Est Non African American: >89 mL/min
Glucose, Bld: 85 mg/dL (ref 70–99)
Potassium: 4.3 mEq/L (ref 3.5–5.3)
Sodium: 140 mEq/L (ref 135–145)
Total Bilirubin: 0.8 mg/dL (ref 0.3–1.2)
Total Protein: 7.1 g/dL (ref 6.0–8.3)

## 2013-06-22 LAB — TSH: TSH: 2.568 u[IU]/mL (ref 0.350–4.500)

## 2013-06-22 LAB — T4, FREE: Free T4: 1.11 ng/dL (ref 0.80–1.80)

## 2013-06-22 MED ORDER — MELATONIN 5 MG PO TABS: 5.0000 mg | ORAL_TABLET | Freq: Every evening | ORAL | Status: DC | PRN

## 2013-06-22 NOTE — Progress Notes (Signed)
Subjective:   Patient ID: Thomas [name] male   DOB: 15-Dec-1975 38 y.o.   MRN: [id number]  HPI: Thomas [name] is a 38 y.o. man who comes for follow regarding insomnia. Over the last few weeks the patient has noticed increasing difficulty sleeping. He reports that he has trouble initiating and maintaning sleep. He feels like he can't make his brain turn off at night. He has tried decreasing alcohol (less than 1 drink in the last week) and stopped taking caffeine after lunchtime. He reports that he sometimes naps between 8 and 10 pm. Also he sleeps with the TV on. He reports that his family says that he wakes up chocking and coughing. His family reports that he stops breathing while sleeping sometimes. He is concerned that sleep apnea may be playing a role.   Denies weight changes, admits to intermittent racing heart rate that coincide with times high high levels of anxiety. Denis changes in bowel or bladder habits. Denies changes in nails or hair.    Past Medical History  Diagnosis Date  . Allergic rhinitis   . 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   . Elevated transaminase level 2009    AST: 80 ALT: 136 in 8/11: Hepatitis A., B and C negative.   . Alcoholism 12/25/2011  . Alcoholic fatty liver 08/22/1759    Needs final HBV and HAV vaccines on or after 10/25/2012   . Childhood asthma    Current Outpatient Prescriptions  Medication Sig Dispense Refill  . amoxicillin-clavulanate (AUGMENTIN) 875-125 MG per tablet Take 1 tablet by mouth 2 (two) times daily. One po bid x 7 days  14 tablet  0  . cetirizine-pseudoephedrine (ZYRTEC-D) 5-120 MG per tablet Take 1 tablet by mouth 2 (two) times daily.  30 tablet  0  . guaiFENesin (MUCINEX) 600 MG 12 hr tablet Take 2 tablets (1,200 mg total) by mouth 2 (two) times daily.  40 tablet  0   No current facility-administered medications for this visit.   Family History  Problem Relation Age of Onset  . Liver disease Father   .  Diabetes Other    History   Social History  . Marital Status: Single    Spouse Name: N/A    Number of Children: N/A  . Years of Education: N/A   Social History Main Topics  . Smoking status: Current Some Day Smoker    Types: Cigars  . Smokeless tobacco: Not on file     Comment: Cigars occasional  . Alcohol Use: Yes     Comment: Beer/wine - 3 times a week (drinks heavily on weekends)  . Drug Use: Yes     Comment: history of cocaine abuse  . Sexual Activity: Not on file   Other Topics Concern  . Not on file   Social History Narrative   Scientist, research (medical).  Single. Gets regular exercise.             Review of Systems:  Review of Systems  Constitutional: Positive for malaise/fatigue. Negative for fever and chills.  HENT: Positive for congestion. Negative for sore throat.   Respiratory: Negative for cough, sputum production and shortness of breath.   Cardiovascular: Positive for palpitations. Negative for chest pain and leg swelling.  Gastrointestinal: Negative for heartburn, nausea, vomiting, abdominal pain, diarrhea and constipation.  Genitourinary: Negative for dysuria, urgency and frequency.  Neurological: Negative for weakness and headaches.     Objective:  Physical Exam: Filed Vitals:  06/22/13 0852  BP: 127/78  Pulse: 78  Temp: 98.4 F (36.9 C)  TempSrc: Oral  Height: 5\' 5"  (1.651 m)  Weight: 294 lb 8 oz (133.584 kg)  SpO2: 95%   Physical Exam  Constitutional: He is oriented to person, place, and time. He appears well-developed and well-nourished. No distress.  HENT:  Head: Normocephalic and atraumatic.  Cardiovascular: Normal rate, regular rhythm, normal heart sounds and intact distal pulses.  Exam reveals no gallop and no friction rub.   No murmur heard. Pulmonary/Chest: Effort normal and breath sounds normal. No respiratory distress. He has no wheezes. He has no rales. He exhibits no tenderness.  Neurological: He is alert and oriented to  person, place, and time.  Skin: He is not diaphoretic.  Psychiatric: He has a normal mood and affect. His behavior is normal.     Assessment & Plan:

## 2013-06-22 NOTE — Patient Instructions (Signed)
Please make the behavioral changes that we discussed.  Please take 5 mg melatonin before bedtime (10 minutes)  Please complete the sleep study.  Insomnia Insomnia is frequent trouble falling and/or staying asleep. Insomnia can be a long term problem or a short term problem. Both are common. Insomnia can be a short term problem when the wakefulness is related to a certain stress or worry. Long term insomnia is often related to ongoing stress during waking hours and/or poor sleeping habits. Overtime, sleep deprivation itself can make the problem worse. Every little thing feels more severe because you are overtired and your ability to cope is decreased. CAUSES   Stress, anxiety, and depression.  Poor sleeping habits.  Distractions such as TV in the bedroom.  Naps close to bedtime.  Engaging in emotionally charged conversations before bed.  Technical reading before sleep.  Alcohol and other sedatives. They may make the problem worse. They can hurt normal sleep patterns and normal dream activity.  Stimulants such as caffeine for several hours prior to bedtime.  Pain syndromes and shortness of breath can cause insomnia.  Exercise late at night.  Changing time zones may cause sleeping problems (jet lag). It is sometimes helpful to have someone observe your sleeping patterns. They should look for periods of not breathing during the night (sleep apnea). They should also look to see how long those periods last. If you live alone or observers are uncertain, you can also be observed at a sleep clinic where your sleep patterns will be professionally monitored. Sleep apnea requires a checkup and treatment. Give your caregivers your medical history. Give your caregivers observations your family has made about your sleep.  SYMPTOMS   Not feeling rested in the morning.  Anxiety and restlessness at bedtime.  Difficulty falling and staying asleep. TREATMENT   Your caregiver may prescribe  treatment for an underlying medical disorders. Your caregiver can give advice or help if you are using alcohol or other drugs for self-medication. Treatment of underlying problems will usually eliminate insomnia problems.  Medications can be prescribed for short time use. They are generally not recommended for lengthy use.  Over-the-counter sleep medicines are not recommended for lengthy use. They can be habit forming.  You can promote easier sleeping by making lifestyle changes such as:  Using relaxation techniques that help with breathing and reduce muscle tension.  Exercising earlier in the day.  Changing your diet and the time of your last meal. No night time snacks.  Establish a regular time to go to bed.  Counseling can help with stressful problems and worry.  Soothing music and white noise may be helpful if there are background noises you cannot remove.  Stop tedious detailed work at least one hour before bedtime. HOME CARE INSTRUCTIONS   Keep a diary. Inform your caregiver about your progress. This includes any medication side effects. See your caregiver regularly. Take note of:  Times when you are asleep.  Times when you are awake during the night.  The quality of your sleep.  How you feel the next day. This information will help your caregiver care for you.  Get out of bed if you are still awake after 15 minutes. Read or do some quiet activity. Keep the lights down. Wait until you feel sleepy and go back to bed.  Keep regular sleeping and waking hours. Avoid naps.  Exercise regularly.  Avoid distractions at bedtime. Distractions include watching television or engaging in any intense or detailed activity like attempting  to balance the household checkbook.  Develop a bedtime ritual. Keep a familiar routine of bathing, brushing your teeth, climbing into bed at the same time each night, listening to soothing music. Routines increase the success of falling to sleep  faster.  Use relaxation techniques. This can be using breathing and muscle tension release routines. It can also include visualizing peaceful scenes. You can also help control troubling or intruding thoughts by keeping your mind occupied with boring or repetitive thoughts like the old concept of counting sheep. You can make it more creative like imagining planting one beautiful flower after another in your backyard garden.  During your day, work to eliminate stress. When this is not possible use some of the previous suggestions to help reduce the anxiety that accompanies stressful situations. MAKE SURE YOU:   Understand these instructions.  Will watch your condition.  Will get help right away if you are not doing well or get worse. Document Released: 05/23/2000 Document Revised: 08/18/2011 Document Reviewed: 06/23/2007 [facility] Patient Information 2014 [place].

## 2013-06-23 DIAGNOSIS — G4733 Obstructive sleep apnea (adult) (pediatric): Secondary | ICD-10-CM | POA: Insufficient documentation

## 2013-06-23 NOTE — Assessment & Plan Note (Signed)
A: The patient history of snoring, nighttime awakenings and apneic events are concerning for sleep apnea.  P: I rec referral for sleep study.

## 2013-06-23 NOTE — Assessment & Plan Note (Signed)
A: The patients insomnia is of unknown etiology at this time. He has poor sleep hygiene (takes early night naps, drinks large amounts of caffeine, and watches TV in bed. He also has signs concering for sleep apnea. There also may be an anxiety component to the patients sleep disturbance.   P: I recommended that the patient stop caffeinated beverages. I rec no TV in bed. I rec sleep study for sleep apnea. I also recommended OTC melatonin as needed for sleep. The patient is in agreement with this plan. I plan to follow up with the patient regarding this issue.   I checked TSH and Free T4 which wer normal, thus hyperthyroid is unlikely. CBC was unremarkable and CMP demonstrated stable mild elevations of LFTs.

## 2013-07-05 NOTE — Progress Notes (Signed)
Case discussed with Dr. Komanski at the time of the visit.  We reviewed the resident's history and exam and pertinent patient test results.  I agree with the assessment, diagnosis, and plan of care documented in the resident's note.      

## 2013-07-13 ENCOUNTER — Encounter: Payer: 59 | Admitting: Internal Medicine

## 2013-07-18 ENCOUNTER — Ambulatory Visit (HOSPITAL_BASED_OUTPATIENT_CLINIC_OR_DEPARTMENT_OTHER): Payer: 59 | Attending: Internal Medicine | Admitting: Radiology

## 2013-07-18 VITALS — Ht 65.0 in | Wt 275.0 lb

## 2013-07-18 DIAGNOSIS — G4700 Insomnia, unspecified: Secondary | ICD-10-CM

## 2013-07-18 DIAGNOSIS — G4733 Obstructive sleep apnea (adult) (pediatric): Secondary | ICD-10-CM | POA: Insufficient documentation

## 2013-07-23 DIAGNOSIS — G4700 Insomnia, unspecified: Secondary | ICD-10-CM

## 2013-07-23 DIAGNOSIS — G4733 Obstructive sleep apnea (adult) (pediatric): Secondary | ICD-10-CM

## 2013-07-23 NOTE — Sleep Study (Signed)
   NAME: Thomas [name] DATE OF BIRTH:  Feb 25, 1976 MEDICAL RECORD NUMBER [id number]  LOCATION: [facility]  PHYSICIAN: , D  DATE OF STUDY: 07/18/2013  SLEEP STUDY TYPE: Nocturnal Polysomnogram               REFERRING PHYSICIAN: Marrion Coy, *  INDICATION FOR STUDY: Insomnia with sleep apnea  EPWORTH SLEEPINESS SCORE:   12/24 HEIGHT: 5\' 5"  (165.1 cm)  WEIGHT: 275 lb (124.739 kg)    Body mass index is 45.76 kg/(m^2).  NECK SIZE: 19.5 in.  MEDICATIONS: Charted for review  SLEEP ARCHITECTURE: Split study protocol. During the diagnostic phase sleep time 123 minutes with sleep efficiency 51.4%. Stage I was 8.5%, stage II 91.5%, stage III and REM were absent. Sleep latency 49.5 minutes, REM latency and a, awake after sleep onset 67 minutes, arousal index 114.1. Bedtime medication: Magnesium  RESPIRATORY DATA: Split study protocol. Apnea hypopnea index (AHI) 123.4 per hour. A total of 253 events scored including 192 obstructive apneas, 2 central apneas, 3 mixed apneas, 56 hypopneas. Events were not positional. CPAP was titrated to 6 CWP. Because of the appearance of numerous central apneas at this pressure, the patient was changed to bilevel and titrated to a final pressure of 14 CWP and expiratory pressure of 10 CWP, AHI 0. He wore a medium ResMed AirFit F10 fullface mask with heated humidifier.  OXYGEN DATA: Moderate snoring before CPAP with oxygen desaturation to a nadir of 75% on room air. With bilevel control, snoring was prevented and mean oxygen saturation held 88.4% on room air. Supplemental oxygen was not provided.  CARDIAC DATA: Normal sinus rhythm  MOVEMENT/PARASOMNIA: No significant movement disturbance. Bathroom x2  IMPRESSION/ RECOMMENDATION:   1) Very severe obstructive sleep apnea/hypopnea syndrome, AHI 123.4 per hour with non-positional effects. Moderate snoring with oxygen desaturation to a nadir of 75% on room air.  2) Titration to  CPAP of 6 demonstrated appearance of significant numbers of central apneas. Pressure mode was changed to bilevel (BiPAP) with good control at a final inspiratory pressure of 14 and expiratory pressure of 10, AHI 0 per hour.     He wore a medium ResMed AirFit F10 fullface mask with heated humidifier. Snoring was prevented and mean oxygen saturation held 80.4% on room air. 3) Room air oxygen saturation while awake, on arrival, was 96%. Because of persistent low saturation with control of apneas, recommend reevaluation of home oxygenation while asleep, once established with BiPAP.   Signed Baird Lyons M.D. Deneise Lever Diplomate, American Board of Sleep Medicine  ELECTRONICALLY SIGNED ON:  07/23/2013, 8:49 AM [name] PH: [phone]   FX: [phone] [place]

## 2013-07-29 ENCOUNTER — Other Ambulatory Visit: Payer: Self-pay | Admitting: Internal Medicine

## 2013-07-29 DIAGNOSIS — G4733 Obstructive sleep apnea (adult) (pediatric): Secondary | ICD-10-CM

## 2013-07-29 NOTE — Progress Notes (Signed)
Opened in error

## 2013-10-19 ENCOUNTER — Encounter: Payer: 59 | Admitting: Internal Medicine

## 2015-01-23 ENCOUNTER — Other Ambulatory Visit (HOSPITAL_COMMUNITY): Payer: Self-pay | Admitting: Respiratory Therapy

## 2015-01-23 DIAGNOSIS — Z9989 Dependence on other enabling machines and devices: Secondary | ICD-10-CM

## 2015-01-23 DIAGNOSIS — R0689 Other abnormalities of breathing: Secondary | ICD-10-CM

## 2015-01-26 ENCOUNTER — Ambulatory Visit (HOSPITAL_COMMUNITY)
Admission: RE | Admit: 2015-01-26 | Discharge: 2015-01-26 | Disposition: A | Discharge status: Home / Self Care | Payer: 59 | Source: Ambulatory Visit | Attending: Internal Medicine | Admitting: Internal Medicine

## 2015-01-26 DIAGNOSIS — R0689 Other abnormalities of breathing: Secondary | ICD-10-CM | POA: Diagnosis present

## 2015-01-26 DIAGNOSIS — Z9989 Dependence on other enabling machines and devices: Secondary | ICD-10-CM | POA: Diagnosis not present

## 2015-01-30 LAB — PULMONARY FUNCTION TEST
DL/VA % pred: 157 %
DL/VA: 6.81 ml/min/mmHg/L
DLCO unc % pred: 110 %
DLCO unc: 28.24 ml/min/mmHg
FEF 25-75 Pre: 4.14 L/sec
FEF2575-%Pred-Pre: 113 %
FEV1-%Pred-Pre: 73 %
FEV1-Pre: 2.69 L
FEV1FVC-%Pred-Pre: 111 %
FEV6-%Pred-Pre: 67 %
FEV6-PRE: 3.01 L
FEV6FVC-%Pred-Pre: 102 %
FVC-%Pred-Pre: 66 %
FVC-Pre: 3.01 L
Pre FEV1/FVC ratio: 90 %
Pre FEV6/FVC Ratio: 100 %
RV % pred: 82 %
RV: 1.24 L
TLC % pred: 73 %
TLC: 4.32 L

## 2015-02-14 ENCOUNTER — Institutional Professional Consult (permissible substitution): Payer: 59 | Admitting: Neurology

## 2015-02-14 ENCOUNTER — Telehealth: Payer: Self-pay

## 2015-02-14 NOTE — Telephone Encounter (Signed)
PAtient did not show to appt.

## 2015-02-20 ENCOUNTER — Encounter: Payer: Self-pay | Admitting: Neurology

## 2016-05-06 DIAGNOSIS — Z6841 Body Mass Index (BMI) 40.0 and over, adult: Secondary | ICD-10-CM | POA: Insufficient documentation

## 2016-05-06 DIAGNOSIS — Z0000 Encounter for general adult medical examination without abnormal findings: Secondary | ICD-10-CM | POA: Insufficient documentation

## 2016-05-07 DIAGNOSIS — E559 Vitamin D deficiency, unspecified: Secondary | ICD-10-CM | POA: Insufficient documentation

## 2018-06-24 DIAGNOSIS — Z79899 Other long term (current) drug therapy: Secondary | ICD-10-CM | POA: Diagnosis not present

## 2018-06-24 DIAGNOSIS — Z9109 Other allergy status, other than to drugs and biological substances: Secondary | ICD-10-CM | POA: Diagnosis not present

## 2018-06-24 DIAGNOSIS — F909 Attention-deficit hyperactivity disorder, unspecified type: Secondary | ICD-10-CM | POA: Diagnosis not present

## 2018-06-24 DIAGNOSIS — L239 Allergic contact dermatitis, unspecified cause: Secondary | ICD-10-CM | POA: Diagnosis not present

## 2018-08-05 DIAGNOSIS — Z23 Encounter for immunization: Secondary | ICD-10-CM | POA: Diagnosis not present

## 2018-08-13 DIAGNOSIS — T783XXD Angioneurotic edema, subsequent encounter: Secondary | ICD-10-CM | POA: Diagnosis not present

## 2018-08-13 DIAGNOSIS — R220 Localized swelling, mass and lump, head: Secondary | ICD-10-CM | POA: Diagnosis not present

## 2019-02-02 ENCOUNTER — Telehealth: Payer: Self-pay | Admitting: General Practice

## 2019-02-02 NOTE — Telephone Encounter (Signed)
I called and left message on patient voicemail to call office and schedule new patient appointment with Dr. Zigmund Daniel per online request office received.

## 2019-02-07 ENCOUNTER — Other Ambulatory Visit: Payer: Self-pay

## 2019-02-07 ENCOUNTER — Encounter: Payer: Self-pay | Admitting: Family Medicine

## 2019-02-07 ENCOUNTER — Ambulatory Visit (INDEPENDENT_AMBULATORY_CARE_PROVIDER_SITE_OTHER): Payer: BC Managed Care – PPO | Admitting: Family Medicine

## 2019-02-07 DIAGNOSIS — L409 Psoriasis, unspecified: Secondary | ICD-10-CM | POA: Insufficient documentation

## 2019-02-07 DIAGNOSIS — Z23 Encounter for immunization: Secondary | ICD-10-CM

## 2019-02-07 DIAGNOSIS — F1020 Alcohol dependence, uncomplicated: Secondary | ICD-10-CM | POA: Diagnosis not present

## 2019-02-07 DIAGNOSIS — Z13220 Encounter for screening for lipoid disorders: Secondary | ICD-10-CM | POA: Diagnosis not present

## 2019-02-07 DIAGNOSIS — Z0000 Encounter for general adult medical examination without abnormal findings: Secondary | ICD-10-CM | POA: Insufficient documentation

## 2019-02-07 LAB — CBC WITH DIFFERENTIAL/PLATELET
Basophils Absolute: 0 10*3/uL (ref 0.0–0.1)
Basophils Relative: 0.9 % (ref 0.0–3.0)
Eosinophils Absolute: 0.3 10*3/uL (ref 0.0–0.7)
Eosinophils Relative: 6 % — ABNORMAL HIGH (ref 0.0–5.0)
HCT: 43.3 % (ref 39.0–52.0)
Hemoglobin: 15 g/dL (ref 13.0–17.0)
Lymphocytes Relative: 29.7 % (ref 12.0–46.0)
Lymphs Abs: 1.4 10*3/uL (ref 0.7–4.0)
MCHC: 34.6 g/dL (ref 30.0–36.0)
MCV: 90 fl (ref 78.0–100.0)
Monocytes Absolute: 0.7 10*3/uL (ref 0.1–1.0)
Monocytes Relative: 14.1 % — ABNORMAL HIGH (ref 3.0–12.0)
Neutro Abs: 2.4 10*3/uL (ref 1.4–7.7)
Neutrophils Relative %: 49.3 % (ref 43.0–77.0)
Platelets: 123 10*3/uL — ABNORMAL LOW (ref 150.0–400.0)
RBC: 4.81 Mil/uL (ref 4.22–5.81)
RDW: 14.1 % (ref 11.5–15.5)
WBC: 4.8 10*3/uL (ref 4.0–10.5)

## 2019-02-07 LAB — COMPREHENSIVE METABOLIC PANEL
ALT: 53 U/L (ref 0–53)
AST: 52 U/L — ABNORMAL HIGH (ref 0–37)
Albumin: 3.7 g/dL (ref 3.5–5.2)
Alkaline Phosphatase: 109 U/L (ref 39–117)
BUN: 10 mg/dL (ref 6–23)
CO2: 30 mEq/L (ref 19–32)
Calcium: 9.3 mg/dL (ref 8.4–10.5)
Chloride: 106 mEq/L (ref 96–112)
Creatinine, Ser: 0.68 mg/dL (ref 0.40–1.50)
GFR: 128.2 mL/min (ref >60.00)
Glucose, Bld: 79 mg/dL (ref 70–99)
Potassium: 4.3 mEq/L (ref 3.5–5.1)
Sodium: 141 mEq/L (ref 135–145)
Total Bilirubin: 1.5 mg/dL — ABNORMAL HIGH (ref 0.2–1.2)
Total Protein: 7.3 g/dL (ref 6.0–8.3)

## 2019-02-07 LAB — LIPID PANEL
Cholesterol: 169 mg/dL (ref 0–200)
HDL: 51.8 mg/dL (ref >39.00)
LDL Cholesterol: 92 mg/dL (ref 0–99)
NonHDL: 117.64
Total CHOL/HDL Ratio: 3
Triglycerides: 126 mg/dL (ref 0.0–149.0)
VLDL: 25.2 mg/dL (ref 0.0–40.0)

## 2019-02-07 LAB — TSH: TSH: 2.85 u[IU]/mL (ref 0.35–4.50)

## 2019-02-07 LAB — VITAMIN D 25 HYDROXY (VIT D DEFICIENCY, FRACTURES): VITD: 44.75 ng/mL (ref 30.00–100.00)

## 2019-02-07 MED ORDER — BETAMETHASONE DIPROPIONATE AUG 0.05 % EX CREA: TOPICAL_CREAM | Freq: Two times a day (BID) | CUTANEOUS | 1 refills | Status: DC

## 2019-02-07 NOTE — Assessment & Plan Note (Signed)
Well adult Orders Placed This Encounter  Procedures  . Flu Vaccine QUAD 6+ mos PF IM (Fluarix Quad PF)  . Comp Met (CMET)  . CBC w/Diff  . Lipid panel  . TSH  . Vitamin D (25 hydroxy)  -Screenings: Lipid -Immunizations: flu -Anticipatory guidance/Risk factor reduction:  Discussed EtoH reduction and weight loss.  He expresses understanding.  Additional recommendations per AVS.

## 2019-02-07 NOTE — Assessment & Plan Note (Signed)
-  We discussed that he should reduce his alcohol intake.  Reviewed normal EtOH intake amounts.   -Update LFT's

## 2019-02-07 NOTE — Progress Notes (Signed)
Thomas MCCREADIE - 43 y.o. male MRN [id number]  Date of birth: Feb 28, 1976  Subjective Chief Complaint  Patient presents with  . Establish Care    est care/ CPE/ had flu shot 08/2018/    HPI Thomas [name] is a 43 y.o. male wit history of alcoholic fatty liver disease and obesity here today for initial visit and CPE.  He has concerns about rash on elbows and legs.  Has been present for several months and comes and goes somewhat.  It is itchy at times.  He denies associated joint pain.  He does also have a history of fatty liver related to EtOH use.  He tells me that he has cut back on EtOH consumption but will still typically binge drink a 12 pack of beer on the weekend.  He is aware that he should cut back.  He is a non-smoker.  He does not exercise regularly and he admits that his diet could be much better.  He does have a dentist but doesn't go regularly.     Review of Systems  Constitutional: Negative for chills, fever, malaise/fatigue and weight loss.  HENT: Negative for congestion, ear pain and sore throat.   Eyes: Negative for blurred vision, double vision and pain.  Respiratory: Negative for cough and shortness of breath.   Cardiovascular: Negative for chest pain and palpitations.  Gastrointestinal: Negative for abdominal pain, blood in stool, constipation, heartburn and nausea.  Genitourinary: Negative for dysuria and urgency.  Musculoskeletal: Negative for joint pain and myalgias.  Skin: Positive for rash.  Neurological: Negative for dizziness and headaches.  Endo/Heme/Allergies: Does not bruise/bleed easily.  Psychiatric/Behavioral: Negative for depression. The patient is not nervous/anxious and does not have insomnia.      Allergies  Allergen Reactions  . Codeine     Past Medical History:  Diagnosis Date  . Alcoholic fatty liver 9/74/1638   Needs final HBV and HAV vaccines on or after 10/25/2012   . Alcoholism ([place]) 12/25/2011  . Allergic rhinitis   . Childhood asthma    . Elevated transaminase level 2009   AST: 80 ALT: 136 in 8/11: Hepatitis A., B and C negative.   . Morbid obesity ([place])   . 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   . Sleep apnea     No past surgical history on file.  Social History   Socioeconomic History  . Marital status: Single    Spouse name: Not on file  . Number of children: Not on file  . Years of education: Not on file  . Highest education level: Not on file  Occupational History  . Not on file  Social Needs  . Financial resource strain: Not on file  . Food insecurity    Worry: Not on file    Inability: Not on file  . Transportation needs    Medical: Not on file    Non-medical: Not on file  Tobacco Use  . Smoking status: Never Smoker  . Smokeless tobacco: Never Used  . Tobacco comment: Cigars occasional  Substance and Sexual Activity  . Alcohol use: Yes    Comment: Beer/wine - 3 times a week (drinks heavily on weekends)  . Drug use: Yes    Comment: history of cocaine abuse  . Sexual activity: Not on file  Lifestyle  . Physical activity    Days per week: Not on file    Minutes per session: Not on file  . Stress: Not on file  Relationships  . Social Herbalist on phone: Not on file    Gets together: Not on file    Attends religious service: Not on file    Active member of club or organization: Not on file    Attends meetings of clubs or organizations: Not on file    Relationship status: Not on file  Other Topics Concern  . Not on file  Social History Narrative   Scientist, research (medical).  Single. Gets regular exercise.              Family History  Problem Relation Age of Onset  . Liver disease Father   . Diabetes Father   . Hypertension Father   . Diabetes Other   . Liver disease Sister     Health Maintenance  Topic Date Due  . LIPID PANEL  01/18/2015  . INFLUENZA VACCINE  01/08/2019  . TETANUS/TDAP  04/27/2022  . HIV Screening  Completed     ----------------------------------------------------------------------------------------------------------------------------------------------------------------------------------------------------------------- Physical Exam BP 138/90   Pulse 88   Temp 98 F (36.7 C) (Oral)   Ht 5' 5" (1.651 m)   Wt (!) 312 lb 12.8 oz (141.9 kg)   SpO2 95%   BMI 52.05 kg/m   Physical Exam Constitutional:      General: He is not in acute distress.    Appearance: Normal appearance.  HENT:     Head: Normocephalic and atraumatic.     Right Ear: Tympanic membrane and external ear normal.     Left Ear: Tympanic membrane and external ear normal.     Mouth/Throat:     Mouth: Mucous membranes are moist.  Eyes:     General: No scleral icterus.    Pupils: Pupils are equal, round, and reactive to light.  Neck:     Musculoskeletal: Normal range of motion.     Thyroid: No thyromegaly.  Cardiovascular:     Rate and Rhythm: Normal rate and regular rhythm.     Heart sounds: Normal heart sounds.  Pulmonary:     Effort: Pulmonary effort is normal.     Breath sounds: Normal breath sounds.  Abdominal:     General: Bowel sounds are normal. There is no distension.     Palpations: Abdomen is soft.     Tenderness: There is no abdominal tenderness. There is no guarding.  Musculoskeletal:     Right lower leg: No edema.     Left lower leg: No edema.  Lymphadenopathy:     Cervical: No cervical adenopathy.  Skin:    General: Skin is warm and dry.     Findings: Rash (Scaly plaques on extensor surfaces of L elbow and bilateral legs. ) present.  Neurological:     Mental Status: He is alert and oriented to person, place, and time.     Cranial Nerves: No cranial nerve deficit.     Motor: No abnormal muscle tone.  Psychiatric:        Mood and Affect: Mood normal.        Behavior: Behavior normal.      ------------------------------------------------------------------------------------------------------------------------------------------------------------------------------------------------------------------- Assessment and Plan  Alcoholism -We discussed that he should reduce his alcohol intake.  Reviewed normal EtOH intake amounts.   -Update LFT's  Well adult exam Well adult Orders Placed This Encounter  Procedures  . Flu Vaccine QUAD 6+ mos PF IM (Fluarix Quad PF)  . Comp Met (CMET)  . CBC w/Diff  . Lipid panel  . TSH  . Vitamin D (25  hydroxy)  -Screenings: Lipid -Immunizations: flu -Anticipatory guidance/Risk factor reduction:  Discussed EtoH reduction and weight loss.  He expresses understanding.  Additional recommendations per AVS.    Psoriasis -rash with appearance of psoriasis.  Trial of topical steroid initially.  Check vitamin d levels as well.

## 2019-02-07 NOTE — Patient Instructions (Signed)
Preventive Care 40-41 Years Old, Male Preventive care refers to lifestyle choices and visits with your health care provider that can promote health and wellness. This includes:  A yearly physical exam. This is also called an annual well check.  Regular dental and eye exams.  Immunizations.  Screening for certain conditions.  Healthy lifestyle choices, such as eating a healthy diet, getting regular exercise, not using drugs or products that contain nicotine and tobacco, and limiting alcohol use. What can I expect for my preventive care visit? Physical exam Your health care provider will check:  Height and weight. These may be used to calculate body mass index (BMI), which is a measurement that tells if you are at a healthy weight.  Heart rate and blood pressure.  Your skin for abnormal spots. Counseling Your health care provider may ask you questions about:  Alcohol, tobacco, and drug use.  Emotional well-being.  Home and relationship well-being.  Sexual activity.  Eating habits.  Work and work environment. What immunizations do I need?  Influenza (flu) vaccine  This is recommended every year. Tetanus, diphtheria, and pertussis (Tdap) vaccine  You may need a Td booster every 10 years. Varicella (chickenpox) vaccine  You may need this vaccine if you have not already been vaccinated. Zoster (shingles) vaccine  You may need this after age 60. Measles, mumps, and rubella (MMR) vaccine  You may need at least one dose of MMR if you were born in 1957 or later. You may also need a second dose. Pneumococcal conjugate (PCV13) vaccine  You may need this if you have certain conditions and were not previously vaccinated. Pneumococcal polysaccharide (PPSV23) vaccine  You may need one or two doses if you smoke cigarettes or if you have certain conditions. Meningococcal conjugate (MenACWY) vaccine  You may need this if you have certain conditions. Hepatitis A vaccine   You may need this if you have certain conditions or if you travel or work in places where you may be exposed to hepatitis A. Hepatitis B vaccine  You may need this if you have certain conditions or if you travel or work in places where you may be exposed to hepatitis B. Haemophilus influenzae type b (Hib) vaccine  You may need this if you have certain risk factors. Human papillomavirus (HPV) vaccine  If recommended by your health care provider, you may need three doses over 6 months. You may receive vaccines as individual doses or as more than one vaccine together in one shot (combination vaccines). Talk with your health care provider about the risks and benefits of combination vaccines. What tests do I need? Blood tests  Lipid and cholesterol levels. These may be checked every 5 years, or more frequently if you are over 50 years old.  Hepatitis C test.  Hepatitis B test. Screening  Lung cancer screening. You may have this screening every year starting at age 55 if you have a 30-pack-year history of smoking and currently smoke or have quit within the past 15 years.  Prostate cancer screening. Recommendations will vary depending on your family history and other risks.  Colorectal cancer screening. All adults should have this screening starting at age 50 and continuing until age 75. Your health care provider may recommend screening at age 45 if you are at increased risk. You will have tests every 1-10 years, depending on your results and the type of screening test.  Diabetes screening. This is done by checking your blood sugar (glucose) after you have not eaten   for a while (fasting). You may have this done every 1-3 years.  Sexually transmitted disease (STD) testing. Follow these instructions at home: Eating and drinking  Eat a diet that includes fresh fruits and vegetables, whole grains, lean protein, and low-fat dairy products.  Take vitamin and mineral supplements as recommended  by your health care provider.  Do not drink alcohol if your health care provider tells you not to drink.  If you drink alcohol: ? Limit how much you have to 0-2 drinks a day. ? Be aware of how much alcohol is in your drink. In the [place] one drink equals one 12 oz bottle of beer (355 mL), one 5 oz glass of wine (148 mL), or one 1 oz glass of hard liquor (44 mL). Lifestyle  Take daily care of your teeth and gums.  Stay active. Exercise for at least 30 minutes on 5 or more days each week.  Do not use any products that contain nicotine or tobacco, such as cigarettes, e-cigarettes, and chewing tobacco. If you need help quitting, ask your health care provider.  If you are sexually active, practice safe sex. Use a condom or other form of protection to prevent STIs (sexually transmitted infections).  Talk with your health care provider about taking a low-dose aspirin every day starting at age 18. What's next?  Go to your health care provider once a year for a well check visit.  Ask your health care provider how often you should have your eyes and teeth checked.  Stay up to date on all vaccines. This information is not intended to replace advice given to you by your health care provider. Make sure you discuss any questions you have with your health care provider. Document Released: 06/22/2015 Document Revised: 05/20/2018 Document Reviewed: 05/20/2018 Elsevier Patient Education  [employer].    Psoriasis Psoriasis is a long-term (chronic) skin condition. It occurs because your body's defense system (immune system) causes skin cells to form too quickly. This causes raised, red patches (plaques) on your skin that look silvery. The patches may be on all areas of your body. They can be any size or shape. Psoriasis can come and go. It can range from mild to very bad. It cannot be passed from one person to another (is not contagious). There is no cure for this condition, but it can be helped  with treatment. What are the causes? The cause of psoriasis is not known. Some things can make it worse. These are:  Skin damage, such as cuts, scrapes, sunburn, and dryness.  Not getting enough sunlight.  Some medicines.  Alcohol.  Tobacco.  Stress.  Infections. What increases the risk?  Having a family member with psoriasis.  Being very overweight (obese).  Being 18-11 years old.  Taking certain medicines. What are the signs or symptoms? There are different types of psoriasis. The types are:  Plaque. This is the most common. Symptoms include red, raised patches with a silvery coating. These may be itchy. Your nails may be crumbly or fall off.  Guttate. Symptoms include small red spots on your stomach area, arms, and legs. These may happen after you have been sick, such as with strep throat.  Inverse. Symptoms include patches in your armpits, under your breasts, private areas, or on your butt.  Pustular. Symptoms include pus-filled bumps on the palms of your hands or the soles of your feet. You also may feel very tired, weak, have a fever, and not be hungry.  Erythrodermic.  Symptoms include bright red skin that looks burned. You may have a fast heartbeat and a body temperature that is too high or too low. You may be itchy or in pain.  Sebopsoriasis. Symptoms include red patches on your scalp, forehead, and face that are greasy.  Psoriatic arthritis. Symptoms include swollen, painful joints along with scaly skin patches. How is this treated? There is no cure for this condition, but treatment can:  Help your skin heal.  Lessen itching and irritation and swelling (inflammation).  Slow the growth of new skin cells.  Help your body's defense system respond better to your skin. Treatment may include:  Creams or ointments.  Light therapy. This may include natural sunlight or light therapy in a doctor's office.  Medicines. These can help your body better manage skin  cells. They may be used with light therapy or ointments. Medicines may include pills or injections. You may also get antibiotic medicines if you have an infection. Follow these instructions at home: Skin Care  Apply lotion to your skin as needed. Only use those that your doctor has said are okay.  Apply cool, wet cloths (cold compresses) to the affected areas.  Do not use a hot tub or take hot showers. Use slightly warm, not hot, water when taking showers and baths.  Do not scratch your skin. Lifestyle   Do not use any products that contain nicotine or tobacco, such as cigarettes, e-cigarettes, and chewing tobacco. If you need help quitting, ask your doctor.  Lower your stress.  Keep a healthy weight.  Go out in the sun as told by your doctor. Do not get sunburned.  Join a support group. Medicines  Take or use over-the-counter and prescription medicines only as told by your doctor.  If you were prescribed an antibiotic medicine, take it as told by your doctor. Do not stop using the antibiotic even if you start to feel better. Alcohol use If you drink alcohol:  Limit how much you use: ? 0-1 drink a day for women. ? 0-2 drinks a day for men.  Be aware of how much alcohol is in your drink. In the [place] one drink equals one 12 oz bottle of beer (355 mL), one 5 oz glass of wine (148 mL), or one 1 oz glass of hard liquor (44 mL). General instructions  Keep a journal to track the things that cause symptoms (triggers). Try to avoid these things.  See a counselor if you feel the support would help.  Keep all follow-up visits as told by your doctor. This is important. Contact a doctor if:  You have a fever.  Your pain gets worse.  You have more redness or warmth in the affected areas.  You have new or worse pain or stiffness in your joints.  Your nails start to break easily or pull away from the nail bed.  You feel very sad (depressed). Summary  Psoriasis is a  long-term (chronic) skin condition.  There is no cure for this condition, but treatment can help manage it.  Keep a journal to track the things that cause symptoms.  Take or use over-the-counter and prescription medicines only as told by your doctor.  Keep all follow-up visits as told by your doctor. This is important. This information is not intended to replace advice given to you by your health care provider. Make sure you discuss any questions you have with your health care provider. Document Released: 07/03/2004 Document Revised: 03/30/2018 Document Reviewed: 03/30/2018 Elsevier  Patient Education  [employer].

## 2019-02-07 NOTE — Assessment & Plan Note (Signed)
-  rash with appearance of psoriasis.  Trial of topical steroid initially.  Check vitamin d levels as well.

## 2019-02-08 ENCOUNTER — Encounter: Payer: Self-pay | Admitting: Family Medicine

## 2019-02-09 ENCOUNTER — Other Ambulatory Visit: Payer: Self-pay | Admitting: Family Medicine

## 2019-02-09 MED ORDER — AMOXICILLIN 500 MG PO CAPS: 500.0000 mg | ORAL_CAPSULE | Freq: Three times a day (TID) | ORAL | 0 refills | Status: DC

## 2019-02-10 NOTE — Progress Notes (Signed)
Bilirubin and liver enzymes are elevated.  This is likely related to his alcohol use. I would recommend that we get an ultrasound of the liver to see if there are signs of cirrhosis.

## 2019-02-11 ENCOUNTER — Other Ambulatory Visit: Payer: Self-pay | Admitting: Family Medicine

## 2019-02-11 DIAGNOSIS — K700 Alcoholic fatty liver: Secondary | ICD-10-CM

## 2019-02-21 ENCOUNTER — Ambulatory Visit
Admission: RE | Admit: 2019-02-21 | Discharge: 2019-02-21 | Disposition: A | Discharge status: Home / Self Care | Payer: BC Managed Care – PPO | Source: Ambulatory Visit | Attending: Family Medicine | Admitting: Family Medicine

## 2019-02-21 DIAGNOSIS — R7989 Other specified abnormal findings of blood chemistry: Secondary | ICD-10-CM | POA: Diagnosis not present

## 2019-02-21 DIAGNOSIS — K700 Alcoholic fatty liver: Secondary | ICD-10-CM

## 2019-02-25 ENCOUNTER — Other Ambulatory Visit: Payer: Self-pay | Admitting: Family Medicine

## 2019-02-25 DIAGNOSIS — K7030 Alcoholic cirrhosis of liver without ascites: Secondary | ICD-10-CM

## 2019-02-25 NOTE — Progress Notes (Signed)
Ultrasound shows changes of liver consistent with cirrhosis.  I would recommend that he stop drinking as this is only going to worsen with continue alcohol use.  I would like to check some additional labs including his immunity status to hepatitis B virus.  I would also recommend that he see GI.  Future labs entered, please schedule. Thanks!

## 2019-02-28 ENCOUNTER — Other Ambulatory Visit: Payer: Self-pay

## 2019-02-28 ENCOUNTER — Encounter: Payer: Self-pay | Admitting: Family Medicine

## 2019-02-28 DIAGNOSIS — K7030 Alcoholic cirrhosis of liver without ascites: Secondary | ICD-10-CM

## 2019-03-01 ENCOUNTER — Telehealth: Payer: Self-pay

## 2019-03-01 NOTE — Telephone Encounter (Signed)
During this illness, did/does the patient experience any of the following symptoms? Fever >100.4F []  Yes [x]  No []  Unknown Subjective fever (felt feverish) []  Yes [x]  No []  Unknown Chills []  Yes [x]  No []  Unknown Muscle aches (myalgia) []  Yes [x]  No []  Unknown Runny nose (rhinorrhea) []  Yes [x]  No []  Unknown Sore throat []  Yes [x]  No []  Unknown Cough (new onset or worsening of chronic cough) []  Yes [x]  No []  Unknown Shortness of breath (dyspnea) []  Yes [x]  No []  Unknown Nausea or vomiting []  Yes [x]  No []  Unknown Headache []  Yes [x]  No []  Unknown Abdominal pain  []  Yes [x]  No []  Unknown Diarrhea (?3 loose/looser than normal stools/24hr period) []  Yes [x]  No []  Unknown  

## 2019-03-02 ENCOUNTER — Other Ambulatory Visit (INDEPENDENT_AMBULATORY_CARE_PROVIDER_SITE_OTHER): Payer: BC Managed Care – PPO

## 2019-03-02 ENCOUNTER — Other Ambulatory Visit: Payer: Self-pay

## 2019-03-02 DIAGNOSIS — K7030 Alcoholic cirrhosis of liver without ascites: Secondary | ICD-10-CM

## 2019-03-03 DIAGNOSIS — Z135 Encounter for screening for eye and ear disorders: Secondary | ICD-10-CM | POA: Diagnosis not present

## 2019-03-03 DIAGNOSIS — H04123 Dry eye syndrome of bilateral lacrimal glands: Secondary | ICD-10-CM | POA: Diagnosis not present

## 2019-03-03 LAB — HEPATITIS B CORE ANTIBODY, TOTAL: Hep B Core Total Ab: NONREACTIVE

## 2019-03-03 LAB — PROTIME-INR
INR: 1.3 ratio — ABNORMAL HIGH (ref 0.8–1.0)
Prothrombin Time: 14.7 s — ABNORMAL HIGH (ref 9.6–13.1)

## 2019-03-03 LAB — HEPATITIS B SURFACE ANTIBODY, QUANTITATIVE: Hep B S AB Quant (Post): <5 m[IU]/mL — ABNORMAL LOW (ref >=10)

## 2019-03-03 LAB — HEPATITIS C ANTIBODY
Hepatitis C Ab: NONREACTIVE
SIGNAL TO CUT-OFF: 0.1 (ref <1.00)

## 2019-03-03 LAB — HEPATITIS B SURFACE ANTIGEN: Hepatitis B Surface Ag: NONREACTIVE

## 2019-03-03 NOTE — Progress Notes (Signed)
Labs show that he does not have immunity to Hepatitis B.  I would recommend that he have immunization for this.  He should receive 2nd shot 1 month after 1st shot and third shot 6 months after first shot.   I would also recommend hepatitis A vaccine.  He should receive booster dose 6 months after 1st shot.

## 2019-03-15 ENCOUNTER — Other Ambulatory Visit: Payer: Self-pay

## 2019-03-15 ENCOUNTER — Ambulatory Visit (INDEPENDENT_AMBULATORY_CARE_PROVIDER_SITE_OTHER): Payer: BC Managed Care – PPO

## 2019-03-15 DIAGNOSIS — Z23 Encounter for immunization: Secondary | ICD-10-CM

## 2019-03-15 NOTE — Progress Notes (Signed)
Pt came into the office to receive his first Hep A and Hep B vaccines. Hep A given in the right deltoid and Hep B given in the left deltoid. Pt tolerated injections well. No signs/symptoms of a reaction prior to leaving the nurse visit. Pt scheduled [facility] appt for 2nd Hep B in a month.

## 2019-04-01 ENCOUNTER — Encounter: Payer: Self-pay | Admitting: Family Medicine

## 2019-04-19 ENCOUNTER — Ambulatory Visit: Payer: BC Managed Care – PPO

## 2019-07-19 ENCOUNTER — Encounter: Payer: Self-pay | Admitting: Family Medicine

## 2019-08-16 ENCOUNTER — Other Ambulatory Visit: Payer: Self-pay

## 2019-08-17 ENCOUNTER — Ambulatory Visit (INDEPENDENT_AMBULATORY_CARE_PROVIDER_SITE_OTHER): Payer: BC Managed Care – PPO | Admitting: Nurse Practitioner

## 2019-08-17 ENCOUNTER — Encounter: Payer: Self-pay | Admitting: Nurse Practitioner

## 2019-08-17 VITALS — BP 122/78 | HR 79 | Temp 97.7°F | Ht 65.0 in | Wt 313.8 lb

## 2019-08-17 DIAGNOSIS — Z23 Encounter for immunization: Secondary | ICD-10-CM | POA: Diagnosis not present

## 2019-08-17 DIAGNOSIS — L03115 Cellulitis of right lower limb: Secondary | ICD-10-CM | POA: Diagnosis not present

## 2019-08-17 DIAGNOSIS — L409 Psoriasis, unspecified: Secondary | ICD-10-CM | POA: Diagnosis not present

## 2019-08-17 MED ORDER — CEPHALEXIN 500 MG PO CAPS: 500.0000 mg | ORAL_CAPSULE | Freq: Two times a day (BID) | ORAL | 0 refills | Status: DC

## 2019-08-17 MED ORDER — BETAMETHASONE DIPROPIONATE AUG 0.05 % EX CREA: TOPICAL_CREAM | Freq: Two times a day (BID) | CUTANEOUS | 1 refills | Status: DC

## 2019-08-17 MED ORDER — PREDNISONE 20 MG PO TABS: 40.0000 mg | ORAL_TABLET | Freq: Every day | ORAL | 0 refills | Status: DC

## 2019-08-17 NOTE — Progress Notes (Signed)
Subjective:  Patient ID: Thomas [name], male    DOB: Apr 25, 1976  Age: 44 y.o. MRN: [id number]  CC: Rash (pt stated he had a plaque skin rash that got worse Sunday an was hot to touch and spread up right leg//tyelonel helped so he could walk//was given Diprolene cream that helped but lost insurance and ran out)  Rash This is a chronic problem. The current episode started more than 1 year ago. The problem has been waxing and waning since onset. The affected locations include the right lower leg and left elbow. The rash is characterized by dryness, pain, redness and scaling. He was exposed to nothing. Associated symptoms include joint pain. Pertinent negatives include no anorexia, congestion, cough, diarrhea, eye pain, facial edema, fatigue, fever, nail changes, rhinorrhea, shortness of breath, sore throat or vomiting. Past treatments include topical steroids. There is no history of allergies, asthma or eczema.  reports red streak from R. LE plaque to groin yesterday. Streak has resolved but persistent tenderness. Also reports joint pain and stiffness in last 1week.  He was unable to return for hep B & A vaccine due to lapse in his insurance. 1st vaccines administered 03/15/2019.  Reviewed past Medical, Social and Family history today.  Outpatient Medications Prior to Visit  Medication Sig Dispense Refill  . augmented betamethasone dipropionate (DIPROLENE-AF) 0.05 % cream Apply topically 2 (two) times daily. Use for up to two weeks, stop for at least 1 week and then may restart again. 50 g 1  . amoxicillin (AMOXIL) 500 MG capsule Take 1 capsule (500 mg total) by mouth 3 (three) times daily. (Patient not taking: Reported on 08/17/2019) 30 capsule 0   No facility-administered medications prior to visit.    ROS See HPI  Objective:  BP 122/78   Pulse 79   Temp 97.7 F (36.5 C) (Tympanic)   Ht 5\' 5"  (1.651 m)   Wt (!) 313 lb 12.8 oz (142.3 kg)   SpO2 98%   BMI 52.22 kg/m   BP Readings  from Last 3 Encounters:  08/17/19 122/78  02/07/19 138/90  06/22/13 127/78    Wt Readings from Last 3 Encounters:  08/17/19 (!) 313 lb 12.8 oz (142.3 kg)  02/07/19 (!) 312 lb 12.8 oz (141.9 kg)  07/18/13 275 lb (124.7 kg)    Physical Exam Skin:    Findings: Erythema and rash present. Rash is scaling.          Comments: Erythematous plaques on right LE, tender and increased warmth, no drainage, no induration     Assessment & Plan:  This visit occurred during the SARS-CoV-2 public health emergency.  Safety protocols were in place, including screening questions prior to the visit, additional usage of staff PPE, and extensive cleaning of exam room while observing appropriate contact time as indicated for disinfecting solutions.   Allon was seen today for rash.  Diagnoses and all orders for this visit:  Psoriasis -     predniSONE (DELTASONE) 20 MG tablet; Take 2 tablets (40 mg total) by mouth daily with breakfast. Hold diprolene while taking this -     augmented betamethasone dipropionate (DIPROLENE-AF) 0.05 % cream; Apply topically 2 (two) times daily. Use for up to two weeks, stop for at least 1 week and then may restart again.  Cellulitis of right lower extremity -     cephALEXin (KEFLEX) 500 MG capsule; Take 1 capsule (500 mg total) by mouth 2 (two) times daily.  Need for hepatitis B vaccination -  Hepatitis B vaccine adult IM   I have discontinued Bryson Ha. Obi "Steven"'s amoxicillin. I am also having him start on cephALEXin and predniSONE. Additionally, I am having him maintain his augmented betamethasone dipropionate.  Meds ordered this encounter  Medications  . cephALEXin (KEFLEX) 500 MG capsule    Sig: Take 1 capsule (500 mg total) by mouth 2 (two) times daily.    Dispense:  20 capsule    Refill:  0    Order Specific Question:   Supervising Provider    Answer:   Ronnald Nian [id number]  . predniSONE (DELTASONE) 20 MG tablet    Sig: Take 2 tablets (40 mg  total) by mouth daily with breakfast. Hold diprolene while taking this    Dispense:  6 tablet    Refill:  0    Order Specific Question:   Supervising Provider    Answer:   Ronnald Nian [id number]  . augmented betamethasone dipropionate (DIPROLENE-AF) 0.05 % cream    Sig: Apply topically 2 (two) times daily. Use for up to two weeks, stop for at least 1 week and then may restart again.    Dispense:  50 g    Refill:  1    Order Specific Question:   Supervising Provider    Answer:   Ronnald Nian [id number]    Problem List Items Addressed This Visit      Musculoskeletal and Integument   Psoriasis - Primary   Relevant Medications   predniSONE (DELTASONE) 20 MG tablet   augmented betamethasone dipropionate (DIPROLENE-AF) 0.05 % cream    Other Visit Diagnoses    Cellulitis of right lower extremity       Relevant Medications   cephALEXin (KEFLEX) 500 MG capsule   Need for hepatitis B vaccination       Relevant Orders   Hepatitis B vaccine adult IM      Follow-up: Return if symptoms worsen or fail to improve.  Wilfred Lacy, NP

## 2019-08-17 NOTE — Patient Instructions (Addendum)
Ok to administer 2nd of Hep B today, then 3rd in 61month. Also administer 2nd dose of hep A in 32month.  Hold topical steroid while using oral prednisone.  Psoriasis Psoriasis is a long-term (chronic) skin condition. It occurs because your body's defense system (immune system) causes skin cells to form too quickly. This causes raised, red patches (plaques) on your skin that look silvery. The patches may be on all areas of your body. They can be any size or shape. Psoriasis can come and go. It can range from mild to very bad. It cannot be passed from one person to another (is not contagious). There is no cure for this condition, but it can be helped with treatment. What are the causes? The cause of psoriasis is not known. Some things can make it worse. These are:  Skin damage, such as cuts, scrapes, sunburn, and dryness.  Not getting enough sunlight.  Some medicines.  Alcohol.  Tobacco.  Stress.  Infections. What increases the risk?  Having a family member with psoriasis.  Being very overweight (obese).  Being 25-88 years old.  Taking certain medicines. What are the signs or symptoms? There are different types of psoriasis. The types are:  Plaque. This is the most common. Symptoms include red, raised patches with a silvery coating. These may be itchy. Your nails may be crumbly or fall off.  Guttate. Symptoms include small red spots on your stomach area, arms, and legs. These may happen after you have been sick, such as with strep throat.  Inverse. Symptoms include patches in your armpits, under your breasts, private areas, or on your butt.  Pustular. Symptoms include pus-filled bumps on the palms of your hands or the soles of your feet. You also may feel very tired, weak, have a fever, and not be hungry.  Erythrodermic. Symptoms include bright red skin that looks burned. You may have a fast heartbeat and a body temperature that is too high or too low. You may be itchy or in  pain.  Sebopsoriasis. Symptoms include red patches on your scalp, forehead, and face that are greasy.  Psoriatic arthritis. Symptoms include swollen, painful joints along with scaly skin patches. How is this treated? There is no cure for this condition, but treatment can:  Help your skin heal.  Lessen itching and irritation and swelling (inflammation).  Slow the growth of new skin cells.  Help your body's defense system respond better to your skin. Treatment may include:  Creams or ointments.  Light therapy. This may include natural sunlight or light therapy in a doctor's office.  Medicines. These can help your body better manage skin cells. They may be used with light therapy or ointments. Medicines may include pills or injections. You may also get antibiotic medicines if you have an infection. Follow these instructions at home: Skin Care  Apply lotion to your skin as needed. Only use those that your doctor has said are okay.  Apply cool, wet cloths (cold compresses) to the affected areas.  Do not use a hot tub or take hot showers. Use slightly warm, not hot, water when taking showers and baths.  Do not scratch your skin. Lifestyle   Do not use any products that contain nicotine or tobacco, such as cigarettes, e-cigarettes, and chewing tobacco. If you need help quitting, ask your doctor.  Lower your stress.  Keep a healthy weight.  Go out in the sun as told by your doctor. Do not get sunburned.  Join a support group.  Medicines  Take or use over-the-counter and prescription medicines only as told by your doctor.  If you were prescribed an antibiotic medicine, take it as told by your doctor. Do not stop using the antibiotic even if you start to feel better. Alcohol use If you drink alcohol:  Limit how much you use: ? 0-1 drink a day for women. ? 0-2 drinks a day for men.  Be aware of how much alcohol is in your drink. In the [place] one drink equals one 12 oz  bottle of beer (355 mL), one 5 oz glass of wine (148 mL), or one 1 oz glass of hard liquor (44 mL). General instructions  Keep a journal to track the things that cause symptoms (triggers). Try to avoid these things.  See a counselor if you feel the support would help.  Keep all follow-up visits as told by your doctor. This is important. Contact a doctor if:  You have a fever.  Your pain gets worse.  You have more redness or warmth in the affected areas.  You have new or worse pain or stiffness in your joints.  Your nails start to break easily or pull away from the nail bed.  You feel very sad (depressed). Summary  Psoriasis is a long-term (chronic) skin condition.  There is no cure for this condition, but treatment can help manage it.  Keep a journal to track the things that cause symptoms.  Take or use over-the-counter and prescription medicines only as told by your doctor.  Keep all follow-up visits as told by your doctor. This is important. This information is not intended to replace advice given to you by your health care provider. Make sure you discuss any questions you have with your health care provider. Document Revised: 03/30/2018 Document Reviewed: 03/30/2018 Elsevier Patient Education  2020 [employer].

## 2019-08-22 ENCOUNTER — Encounter: Payer: Self-pay | Admitting: Nurse Practitioner

## 2019-09-13 ENCOUNTER — Telehealth: Payer: Self-pay

## 2019-09-13 ENCOUNTER — Ambulatory Visit: Payer: BC Managed Care – PPO

## 2019-09-13 ENCOUNTER — Other Ambulatory Visit: Payer: Self-pay | Admitting: Nurse Practitioner

## 2019-09-13 DIAGNOSIS — L409 Psoriasis, unspecified: Secondary | ICD-10-CM

## 2019-09-13 DIAGNOSIS — L03115 Cellulitis of right lower limb: Secondary | ICD-10-CM

## 2019-09-13 NOTE — Telephone Encounter (Signed)
Last ov 08/17/19 Last fill for both 08/17/19 #20/0  #6/0

## 2019-09-13 NOTE — Telephone Encounter (Signed)
Plz call patient to schedule a TOC appt with [place]/pt is aware you will be calling/thx dmf

## 2019-09-13 NOTE — Telephone Encounter (Signed)
CN-I talked with the patient/the front is going to call him to schedule a TOC visit with you/he is requesting a refill of the cream for retreatment of psoriasis/plz advise/thx dmf

## 2019-09-13 NOTE — Telephone Encounter (Signed)
He already has a refill on cream sent on 08/17/2019

## 2019-09-20 ENCOUNTER — Ambulatory Visit: Payer: BC Managed Care – PPO

## 2019-09-26 ENCOUNTER — Encounter: Payer: Self-pay | Admitting: Family Medicine

## 2019-09-26 ENCOUNTER — Other Ambulatory Visit: Payer: Self-pay

## 2019-09-26 ENCOUNTER — Ambulatory Visit (INDEPENDENT_AMBULATORY_CARE_PROVIDER_SITE_OTHER): Payer: BC Managed Care – PPO | Admitting: Family Medicine

## 2019-09-26 VITALS — BP 118/78 | HR 87 | Temp 98.3°F | Ht 65.0 in | Wt 312.6 lb

## 2019-09-26 DIAGNOSIS — K760 Fatty (change of) liver, not elsewhere classified: Secondary | ICD-10-CM

## 2019-09-26 DIAGNOSIS — E66813 Obesity, class 3: Secondary | ICD-10-CM | POA: Insufficient documentation

## 2019-09-26 DIAGNOSIS — R0681 Apnea, not elsewhere classified: Secondary | ICD-10-CM | POA: Diagnosis not present

## 2019-09-26 DIAGNOSIS — F1020 Alcohol dependence, uncomplicated: Secondary | ICD-10-CM | POA: Diagnosis not present

## 2019-09-26 DIAGNOSIS — I8510 Secondary esophageal varices without bleeding: Secondary | ICD-10-CM | POA: Insufficient documentation

## 2019-09-26 DIAGNOSIS — K7030 Alcoholic cirrhosis of liver without ascites: Secondary | ICD-10-CM | POA: Insufficient documentation

## 2019-09-26 DIAGNOSIS — Z6841 Body Mass Index (BMI) 40.0 and over, adult: Secondary | ICD-10-CM

## 2019-09-26 NOTE — Progress Notes (Addendum)
Established Patient Office Visit  Subjective:  Patient ID: Thomas [name], male    DOB: March 18, 1976  Age: 44 y.o. MRN: [id number]  CC:  Chief Complaint  Patient presents with  . Establish Care    Pt has not complaints today.  Recieved 2nd dose of Covid vaccine last Friday.    HPI [name] presents for follow-up of his apnea and therapy liver disease associated with obesity and alcohol use.  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  He does work from home.  He lives with his parents and helps care for them.  They are older and doing relatively well on their own.  History of apnea but has not used his machine in quite some time.  It sounds as though it is not available to him.  Continues to drink heavily on the weekends.  He has lost weight in the past by changing his eating habits and is planning on doing that again.  Past Medical History:  Diagnosis Date  . Alcoholic fatty liver [id number]   Needs final HBV and HAV vaccines on or after 10/25/2012   . Alcoholism ([place]) 12/25/2011  . Allergic rhinitis   . Childhood asthma   . Elevated transaminase level 2009   AST: 80 ALT: 136 in 8/11: Hepatitis A., B and C negative.   . Morbid obesity ([place])   . 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   . Sleep apnea     History reviewed. No pertinent surgical history.  Family History  Problem Relation Age of Onset  . Liver disease Father   . Diabetes Father   . Hypertension Father   . Diabetes Other   . Liver disease Sister     Social History   Socioeconomic History  . Marital status: Single    Spouse name: Not on file  . Number of children: Not on file  . Years of education: Not on file  . Highest education level: Not on file  Occupational History  . Not on file  Tobacco Use  . Smoking status: Never Smoker  . Smokeless tobacco: Never Used  . Tobacco comment: Cigars occasional  Substance and Sexual Activity  . Alcohol use: Yes    Comment: Beer/wine  - 3 times a week (drinks heavily on weekends)  . Drug use: Yes    Comment: history of cocaine abuse  . Sexual activity: Not on file  Other Topics Concern  . Not on file  Social History Narrative   Scientist, research (medical).  Single. Gets regular exercise.             Social Determinants of Health   Financial Resource Strain:   . Difficulty of Paying Living Expenses:   Food Insecurity:   . Worried About Charity fundraiser in the Last Year:   . Arboriculturist in the Last Year:   Transportation Needs:   . Film/video editor (Medical):   Marland Kitchen Lack of Transportation (Non-Medical):   Physical Activity:   . Days of Exercise per Week:   . Minutes of Exercise per Session:   Stress:   . Feeling of Stress :   Social Connections:   . Frequency of Communication with Friends and Family:   . Frequency of Social Gatherings with Friends and Family:   . Attends Religious Services:   . Active Member of Clubs or Organizations:   . Attends Archivist Meetings:   Marland Kitchen Marital Status:  Intimate Partner Violence:   . Fear of Current or Ex-Partner:   . Emotionally Abused:   Marland Kitchen Physically Abused:   . Sexually Abused:     Outpatient Medications Prior to Visit  Medication Sig Dispense Refill  . augmented betamethasone dipropionate (DIPROLENE-AF) 0.05 % cream Apply topically 2 (two) times daily. Use for up to two weeks, stop for at least 1 week and then may restart again. 50 g 1  . cephALEXin (KEFLEX) 500 MG capsule Take 1 capsule (500 mg total) by mouth 2 (two) times daily. (Patient not taking: Reported on 09/26/2019) 20 capsule 0  . predniSONE (DELTASONE) 20 MG tablet Take 2 tablets (40 mg total) by mouth daily with breakfast. Hold diprolene while taking this (Patient not taking: Reported on 09/26/2019) 6 tablet 0   No facility-administered medications prior to visit.    Allergies  Allergen Reactions  . Codeine     ROS Review of Systems  Constitutional: Negative.   HENT: Negative.    Respiratory: Negative.   Cardiovascular: Negative.   Gastrointestinal: Negative.   Musculoskeletal: Negative for gait problem.  Skin: Negative for pallor and rash.  Neurological: Negative.   Psychiatric/Behavioral: Negative.       Objective:    Physical Exam  Constitutional: He is oriented to person, place, and time. He appears well-developed and well-nourished. No distress.  HENT:  Head: Normocephalic and atraumatic.  Right Ear: External ear normal.  Left Ear: External ear normal.  Mouth/Throat: Oropharynx is clear and moist. No oropharyngeal exudate.  Eyes: Conjunctivae are normal. Right eye exhibits no discharge. Left eye exhibits no discharge. No scleral icterus.  Neck: No JVD present. No tracheal deviation present. No thyromegaly present.  Cardiovascular: Normal rate, regular rhythm and normal heart sounds.  Pulmonary/Chest: Effort normal and breath sounds normal. No stridor.  Abdominal: Bowel sounds are normal.  Musculoskeletal:        General: No edema.  Lymphadenopathy:    He has no cervical adenopathy.  Neurological: He is alert and oriented to person, place, and time.  Skin: Skin is warm and dry. He is not diaphoretic.  Psychiatric: He has a normal mood and affect. His behavior is normal.    BP 118/78 (BP Location: Left Arm, Patient Position: Sitting, Cuff Size: Large)   Pulse 87   Temp 98.3 F (36.8 C) (Temporal)   Ht 5\' 5"  (1.651 m)   Wt (!) 312 lb 9.6 oz (141.8 kg)   SpO2 97%   BMI 52.02 kg/m  Wt Readings from Last 3 Encounters:  09/26/19 (!) 312 lb 9.6 oz (141.8 kg)  08/17/19 (!) 313 lb 12.8 oz (142.3 kg)  02/07/19 (!) 312 lb 12.8 oz (141.9 kg)     Health Maintenance Due  Topic Date Due  . COVID-19 Vaccine (1) Never done    There are no preventive care reminders to display for this patient.  Lab Results  Component Value Date   TSH 2.85 02/07/2019   Lab Results  Component Value Date   WBC 5.2 09/26/2019   HGB 14.5 09/26/2019   HCT 42.0  09/26/2019   MCV 91.3 09/26/2019   PLT 125.0 (L) 09/26/2019   Lab Results  Component Value Date   NA 141 02/07/2019   K 4.3 02/07/2019   CO2 30 02/07/2019   GLUCOSE 79 02/07/2019   BUN 10 02/07/2019   CREATININE 0.68 02/07/2019   BILITOT 1.7 (H) 09/26/2019   ALKPHOS 122 (H) 09/26/2019   AST 61 (H) 09/26/2019   ALT 51  09/26/2019   PROT 6.7 09/26/2019   ALBUMIN 3.6 09/26/2019   CALCIUM 9.3 02/07/2019   GFR 128.20 02/07/2019   Lab Results  Component Value Date   CHOL 169 02/07/2019   Lab Results  Component Value Date   HDL 51.80 02/07/2019   Lab Results  Component Value Date   LDLCALC 92 02/07/2019   Lab Results  Component Value Date   TRIG 126.0 02/07/2019   Lab Results  Component Value Date   CHOLHDL 3 02/07/2019   Lab Results  Component Value Date   HGBA1C 4.8 10/12/2007      Assessment & Plan:   Problem List Items Addressed This Visit      Digestive   Alcoholic cirrhosis of liver without ascites ([place]) - Primary   Relevant Orders   CBC (Completed)   Gamma GT (Completed)   Hepatic function panel (Completed)   MR Abdomen W Wo Contrast     Other   Alcoholism ([place])   Apnea   Relevant Orders   Ambulatory referral to Pulmonology   Class 3 severe obesity due to excess calories with body mass index (BMI) of 50.0 to 59.9 in adult [facility])    Other Visit Diagnoses    Fatty (change of) liver, not elsewhere classified       Relevant Orders   MR Abdomen W Wo Contrast      No orders of the defined types were placed in this encounter.   Follow-up: Return in about 3 months (around 12/26/2019), or if symptoms worsen or fail to improve.  Patient was given information on alcohol abuse and obesity.  Suggested that he stop drinking and he said that he had done that in the past.  Suggested a 12-step program.  He said that he would consider it.  Agrees to go back to sleep medicine for a new apnea machine.  Follow-up in 6 months or sooner if needed.  Libby Maw, MD

## 2019-09-26 NOTE — Patient Instructions (Addendum)
Alcohol Abuse and Dependence Information, Adult Alcohol is a widely available drug. People drink alcohol in different amounts. People who drink alcohol very often and in large amounts often have problems during and after drinking. They may develop what is called an alcohol use disorder. There are two main types of alcohol use disorders:  Alcohol abuse. This is when you use alcohol too much or too often. You may use alcohol to make yourself feel happy or to reduce stress. You may have a hard time setting a limit on the amount you drink.  Alcohol dependence. This is when you use alcohol consistently for a period of time, and your body changes as a result. This can make it hard to stop drinking because you may start to feel sick or feel different when you do not use alcohol. These symptoms are known as withdrawal. How can alcohol abuse and dependence affect me? Alcohol abuse and dependence can have a negative effect on your life. Drinking too much can lead to addiction. You may feel like you need alcohol to function normally. You may drink alcohol before work in the morning, during the day, or as soon as you get home from work in the evening. These actions can result in:  Poor work performance.  Job loss.  Financial problems.  Car crashes or criminal charges from driving after drinking alcohol.  Problems in your relationships with friends and family.  Losing the trust and respect of coworkers, friends, and family. Drinking heavily over a long period of time can permanently damage your body and brain, and can cause lifelong health issues, such as:  Damage to your liver or pancreas.  Heart problems, high blood pressure, or stroke.  Certain cancers.  Decreased ability to fight infections.  Brain or nerve damage.  Depression.  Early (premature) death. If you are careless or you crave alcohol, it is easy to drink more than your body can handle (overdose). Alcohol overdose is a serious  situation that requires hospitalization. It may lead to permanent injuries or death. What can increase my risk?  Having a family history of alcohol abuse.  Having depression or other mental health conditions.  Beginning to drink at an early age.  Binge drinking often.  Experiencing trauma, stress, and an unstable home life during childhood.  Spending time with people who drink often. What actions can I take to prevent or manage alcohol abuse and dependence?  Do not drink alcohol if: ? Your health care provider tells you not to drink. ? You are pregnant, may be pregnant, or are planning to become pregnant.  If you drink alcohol: ? Limit how much you use to:  0-1 drink a day for women.  0-2 drinks a day for men. ? Be aware of how much alcohol is in your drink. In the [place] one drink equals one 12 oz bottle of beer (355 mL), one 5 oz glass of wine (148 mL), or one 1 oz glass of hard liquor (44 mL).  Stop drinking if you have been drinking too much. This can be very hard to do if you are used to abusing alcohol. If you begin to have withdrawal symptoms, talk with your health care provider or a person that you trust. These symptoms may include anxiety, shaky hands, headache, nausea, sweating, or not being able to sleep.  Choose to drink nonalcoholic beverages in social gatherings and places where there may be alcohol. Activity  Spend more time on activities that you enjoy that do   not involve alcohol, like hobbies or exercise.  Find healthy ways to cope with stress, such as exercise, meditation, or spending time with people you care about. General information  Talk to your family, coworkers, and friends about supporting you in your efforts to stop drinking. If they drink, ask them not to drink around you. Spend more time with people who do not drink alcohol.  If you think that you have an alcohol dependency problem: ? Tell friends or family about your concerns. ? Talk with your  health care provider or another health professional about where to get help. ? Work with a Transport planner and a Regulatory affairs officer. ? Consider joining a support group for people who struggle with alcohol abuse and dependence. Where to find support   Your health care provider.  SMART Recovery: www.smartrecovery.org Therapy and support groups  Local treatment centers or chemical dependency counselors.  Local AA groups in your community: NicTax.com.pt Where to find more information  Centers for Disease Control and Prevention: http://www.wolf.info/  National Institute on Alcohol Abuse and Alcoholism: http://www.bradshaw.com/  Alcoholics Anonymous (AA): NicTax.com.pt Contact a health care provider if:  You drank more or for longer than you intended on more than one occasion.  You tried to stop drinking or to cut back on how much you drink, but you were not able to.  You often drink to the point of vomiting or passing out.  You want to drink so badly that you cannot think about anything else.  You have problems in your life due to drinking, but you continue to drink.  You keep drinking even though you feel anxious, depressed, or have experienced memory loss.  You have stopped doing the things you used to enjoy in order to drink.  You have to drink more than you used to in order to get the effect you want.  You experience anxiety, sweating, nausea, shakiness, and trouble sleeping when you try to stop drinking. Get help right away if:  You have thoughts about hurting yourself or others.  You have serious withdrawal symptoms, including: ? Confusion. ? Racing heart. ? High blood pressure. ? Fever. If you ever feel like you may hurt yourself or others, or have thoughts about taking your own life, get help right away. You can go to your nearest emergency department or call:  Your local emergency services (911 in the [place]  A suicide crisis helpline, such as the [place] at 507-455-6204. This is open 24 hours a day. Summary  Alcohol abuse and dependence can have a negative effect on your life. Drinking too much or too often can lead to addiction.  If you drink alcohol, limit how much you use.  If you are having trouble keeping your drinking under control, find ways to change your behavior. Hobbies, calming activities, exercise, or support groups can help.  If you feel you need help with changing your drinking habits, talk with your health care provider, a good friend, or a therapist, or go to an [place] group. This information is not intended to replace advice given to you by your health care provider. Make sure you discuss any questions you have with your health care provider. Document Revised: 09/14/2018 Document Reviewed: 08/03/2018 Elsevier Patient Education  [place].  Obesity, Adult Obesity is the condition of having too much total body fat. Being overweight or obese means that your weight is greater than what is considered healthy for your body size. Obesity is determined by  a measurement called BMI. BMI is an estimate of body fat and is calculated from height and weight. For adults, a BMI of 30 or higher is considered obese. Obesity can lead to other health concerns and major illnesses, including:  Stroke.  Coronary artery disease (CAD).  Type 2 diabetes.  Some types of cancer, including cancers of the colon, breast, uterus, and gallbladder.  Osteoarthritis.  High blood pressure (hypertension).  High cholesterol.  Sleep apnea.  Gallbladder stones.  Infertility problems. What are the causes? Common causes of this condition include:  Eating daily meals that are high in calories, sugar, and fat.  Being born with genes that may make you more likely to become obese.  Having a medical condition that causes obesity, including: ? Hypothyroidism. ? Polycystic ovarian syndrome (PCOS). ? Binge-eating disorder. ? Cushing  syndrome.  Taking certain medicines, such as steroids, antidepressants, and seizure medicines.  Not being physically active (sedentary lifestyle).  Not getting enough sleep.  Drinking high amounts of sugar-sweetened beverages, such as soft drinks. What increases the risk? The following factors may make you more likely to develop this condition:  Having a family history of obesity.  Being a woman of African American descent.  Being a man of Hispanic descent.  Living in an area with limited access to: ? Romilda [place]ret, recreation centers, or sidewalks. ? Healthy food choices, such as grocery stores and farmers' markets. What are the signs or symptoms? The main sign of this condition is having too much body fat. How is this diagnosed? This condition is diagnosed based on:  Your BMI. If you are an adult with a BMI of 30 or higher, you are considered obese.  Your waist circumference. This measures the distance around your waistline.  Your skinfold thickness. Your health care provider may gently pinch a fold of your skin and measure it. You may have other tests to check for underlying conditions. How is this treated? Treatment for this condition often includes changing your lifestyle. Treatment may include some or all of the following:  Dietary changes. This may include developing a healthy meal plan.  Regular physical activity. This may include activity that causes your heart to beat faster (aerobic exercise) and strength training. Work with your health care provider to design an exercise program that works for you.  Medicine to help you lose weight if you are unable to lose 1 pound a week after 6 weeks of healthy eating and more physical activity.  Treating conditions that cause the obesity (underlying conditions).  Surgery. Surgical options may include gastric banding and gastric bypass. Surgery may be done if: ? Other treatments have not helped to improve your condition. ? You have  a BMI of 40 or higher. ? You have life-threatening health problems related to obesity. Follow these instructions at home: Eating and drinking   Follow recommendations from your health care provider about what you eat and drink. Your health care provider may advise you to: ? Limit fast food, sweets, and processed snack foods. ? Choose low-fat options, such as low-fat milk instead of whole milk. ? Eat 5 or more servings of fruits or vegetables every day. ? Eat at home more often. This gives you more control over what you eat. ? Choose healthy foods when you eat out. ? Learn to read food labels. This will help you understand how much food is considered 1 serving. ? Learn what a healthy serving size is. ? Keep low-fat snacks available. ? Limit sugary drinks, such  as soda, fruit juice, sweetened iced tea, and flavored milk.  Drink enough water to keep your urine pale yellow.  Do not follow a fad diet. Fad diets can be unhealthy and even dangerous. Physical activity  Exercise regularly, as told by your health care provider. ? Most adults should get up to 150 minutes of moderate-intensity exercise every week. ? Ask your health care provider what types of exercise are safe for you and how often you should exercise.  Warm up and stretch before being active.  Cool down and stretch after being active.  Rest between periods of activity. Lifestyle  Work with your health care provider and a dietitian to set a weight-loss goal that is healthy and reasonable for you.  Limit your screen time.  Find ways to reward yourself that do not involve food.  Do not drink alcohol if: ? Your health care provider tells you not to drink. ? You are pregnant, may be pregnant, or are planning to become pregnant.  If you drink alcohol: ? Limit how much you use to:  0-1 drink a day for women.  0-2 drinks a day for men. ? Be aware of how much alcohol is in your drink. In the [place] one drink equals one 12  oz bottle of beer (355 mL), one 5 oz glass of wine (148 mL), or one 1 oz glass of hard liquor (44 mL). General instructions  Keep a weight-loss journal to keep track of the food you eat and how much exercise you get.  Take over-the-counter and prescription medicines only as told by your health care provider.  Take vitamins and supplements only as told by your health care provider.  Consider joining a support group. Your health care provider may be able to recommend a support group.  Keep all follow-up visits as told by your health care provider. This is important. Contact a health care provider if:  You are unable to meet your weight loss goal after 6 weeks of dietary and lifestyle changes. Get help right away if you are having:  Trouble breathing.  Suicidal thoughts or behaviors. Summary  Obesity is the condition of having too much total body fat.  Being overweight or obese means that your weight is greater than what is considered healthy for your body size.  Work with your health care provider and a dietitian to set a weight-loss goal that is healthy and reasonable for you.  Exercise regularly, as told by your health care provider. Ask your health care provider what types of exercise are safe for you and how often you should exercise. This information is not intended to replace advice given to you by your health care provider. Make sure you discuss any questions you have with your health care provider. Document Revised: 01/28/2018 Document Reviewed: 01/28/2018 Elsevier Patient Education  2020 [employer].

## 2019-09-27 LAB — HEPATIC FUNCTION PANEL
ALT: 51 U/L (ref 0–53)
AST: 61 U/L — ABNORMAL HIGH (ref 0–37)
Albumin: 3.6 g/dL (ref 3.5–5.2)
Alkaline Phosphatase: 122 U/L — ABNORMAL HIGH (ref 39–117)
Bilirubin, Direct: 0.4 mg/dL — ABNORMAL HIGH (ref 0.0–0.3)
Total Bilirubin: 1.7 mg/dL — ABNORMAL HIGH (ref 0.2–1.2)
Total Protein: 6.7 g/dL (ref 6.0–8.3)

## 2019-09-27 LAB — CBC
HCT: 42 % (ref 39.0–52.0)
Hemoglobin: 14.5 g/dL (ref 13.0–17.0)
MCHC: 34.5 g/dL (ref 30.0–36.0)
MCV: 91.3 fl (ref 78.0–100.0)
Platelets: 125 10*3/uL — ABNORMAL LOW (ref 150.0–400.0)
RBC: 4.6 Mil/uL (ref 4.22–5.81)
RDW: 15 % (ref 11.5–15.5)
WBC: 5.2 10*3/uL (ref 4.0–10.5)

## 2019-09-27 LAB — GAMMA GT: GGT: 306 U/L — ABNORMAL HIGH (ref 7–51)

## 2019-09-28 NOTE — Addendum Note (Signed)
Addended by: Jon Billings on: 09/28/2019 12:26 PM   Modules accepted: Orders

## 2019-10-11 ENCOUNTER — Ambulatory Visit (INDEPENDENT_AMBULATORY_CARE_PROVIDER_SITE_OTHER): Payer: BC Managed Care – PPO

## 2019-10-11 ENCOUNTER — Other Ambulatory Visit: Payer: Self-pay

## 2019-10-11 DIAGNOSIS — Z23 Encounter for immunization: Secondary | ICD-10-CM

## 2019-10-11 NOTE — Patient Instructions (Signed)
Health Maintenance Due  Topic Date Due  . COVID-19 Vaccine (1) Never done    Depression screen [facility] 2/9 09/26/2019 06/22/2013 06/16/2013  Decreased Interest 0 0 0  Down, Depressed, Hopeless 0 0 0  PHQ - 2 Score 0 0 0  Altered sleeping 1 - -  Tired, decreased energy 2 - -  Change in appetite 0 - -  Feeling bad or failure about yourself  0 - -  Trouble concentrating 2 - -  Moving slowly or fidgety/restless 0 - -  Suicidal thoughts 0 - -  PHQ-9 Score 5 - -  Difficult doing work/chores Somewhat difficult - -

## 2019-10-11 NOTE — Progress Notes (Signed)
Per orders of Dr. Ethelene Hal, injection of Hep A, given in the Lt Deltoid and Hep B, Rt Deltoid,  given by  L . Patient tolerated injection well.

## 2019-11-02 ENCOUNTER — Encounter: Payer: Self-pay | Admitting: Family Medicine

## 2019-11-02 DIAGNOSIS — F430 Acute stress reaction: Secondary | ICD-10-CM

## 2019-11-03 ENCOUNTER — Other Ambulatory Visit: Payer: Self-pay | Admitting: Family Medicine

## 2019-11-03 MED ORDER — ALPRAZOLAM 0.25 MG PO TABS: ORAL_TABLET | ORAL | 0 refills | Status: DC

## 2019-11-03 NOTE — Telephone Encounter (Signed)
Sent med to be taken 45 minutes prior to procedure. Will need driver.

## 2019-11-04 ENCOUNTER — Other Ambulatory Visit: Payer: Self-pay

## 2019-11-04 ENCOUNTER — Ambulatory Visit
Admission: RE | Admit: 2019-11-04 | Discharge: 2019-11-04 | Disposition: A | Discharge status: Home / Self Care | Payer: BC Managed Care – PPO | Source: Ambulatory Visit | Attending: Family Medicine | Admitting: Family Medicine

## 2019-11-04 DIAGNOSIS — K7030 Alcoholic cirrhosis of liver without ascites: Secondary | ICD-10-CM

## 2019-11-04 DIAGNOSIS — K760 Fatty (change of) liver, not elsewhere classified: Secondary | ICD-10-CM

## 2019-11-22 ENCOUNTER — Other Ambulatory Visit: Payer: Self-pay

## 2019-11-22 ENCOUNTER — Ambulatory Visit (INDEPENDENT_AMBULATORY_CARE_PROVIDER_SITE_OTHER)
Admission: RE | Admit: 2019-11-22 | Discharge: 2019-11-22 | Disposition: A | Discharge status: Home / Self Care | Payer: BC Managed Care – PPO | Source: Ambulatory Visit

## 2019-11-22 ENCOUNTER — Ambulatory Visit: Payer: BC Managed Care – PPO | Admitting: Dermatology

## 2019-11-22 DIAGNOSIS — J069 Acute upper respiratory infection, unspecified: Secondary | ICD-10-CM

## 2019-11-22 MED ORDER — MUCINEX 600 MG PO TB12
1200.0000 mg | ORAL_TABLET | Freq: Two times a day (BID) | ORAL | 0 refills | Status: DC | PRN
Start: 2019-11-22 — End: 2021-06-14

## 2019-11-22 MED ORDER — IBUPROFEN 600 MG PO TABS: 600.0000 mg | ORAL_TABLET | Freq: Four times a day (QID) | ORAL | 0 refills | Status: DC | PRN

## 2019-11-22 NOTE — Discharge Instructions (Addendum)
Take the ibuprofen and Mucinex as directed.    Follow up with your primary care provider or come here to be seen in person if your symptoms are not improving.

## 2019-11-22 NOTE — ED Provider Notes (Signed)
Virtual Visit via Video Note:  Thomas [name]  initiated request for Telemedicine visit with [facility] Urgent [facility] team. I connected with Amalia Hailey  on 11/22/2019 at 9:47 AM  for a synchronized telemedicine visit using a video enabled HIPPA compliant telemedicine application. I verified that I am speaking with Amalia Hailey  using two identifiers. Sharion Balloon, NP  was physically located in a [facility] Urgent care site and KASHIS PENLEY was located at a different location.   The limitations of evaluation and management by telemedicine as well as the availability of in-person appointments were discussed. Patient was informed that he  may incur a bill ( including co-pay) for this virtual visit encounter. TYREIK DELAHOUSSAYE  expressed understanding and gave verbal consent to proceed with virtual visit.     History of Present Illness:Thomas [name]  is a 44 y.o. male presents for evaluation of 3 day history of fever, sweating, sinus congestion, sinus pressure, ear fullness.  Tmax 101 yesterday.  He has taken Tylenol for his symptoms.  He denies sore throat, cough, shortness of breath, vomiting, diarrhea, rash, or other symptoms.  He had both Pfizer vaccines 2 months ago.  No recent COVID test.     Allergies  Allergen Reactions  . Codeine      Past Medical History:  Diagnosis Date  . Alcoholic fatty liver 8/75/6433   Needs final HBV and HAV vaccines on or after 10/25/2012   . Alcoholism ([place]) 12/25/2011  . Allergic rhinitis   . Childhood asthma   . Elevated transaminase level 2009   AST: 80 ALT: 136 in 8/11: Hepatitis A., B and C negative.   . Morbid obesity ([place])   . 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   . Sleep apnea      Social History   Tobacco Use  . Smoking status: Never Smoker  . Smokeless tobacco: Never Used  . Tobacco comment: Cigars occasional  Substance Use Topics  . Alcohol use: Yes    Comment: Beer/wine - 3 times a week (drinks heavily on  weekends)  . Drug use: Yes    Comment: history of cocaine abuse    ROS: as stated in HPI.  All other systems reviewed and negative.       Observations/Objective: Physical Exam  VITALS: Patient denies fever. GENERAL: Alert, appears well and in no acute distress. HEENT: Atraumatic. Oral mucosa appears moist. NECK: Normal movements of the head and neck. CARDIOPULMONARY: No increased WOB. Speaking in clear sentences. I:E ratio WNL.  MS: Moves all visible extremities without noticeable abnormality. PSYCH: Pleasant and cooperative, well-groomed. Speech normal rate and rhythm. Affect is appropriate. Insight and judgement are appropriate. Attention is focused, linear, and appropriate.  NEURO: CN grossly intact. Oriented as arrived to appointment on time with no prompting. Moves both UE equally.  SKIN: No obvious lesions, wounds, erythema, or cyanosis noted on face or hands.   Assessment and Plan:    ICD-10-CM   1. Viral URI  J06.9        Follow Up Instructions: Treating with ibuprofen and Mucinex.  Instructed patient to follow-up with his PCP or come here to be seen in person if his symptoms are not improving.  Patient agrees to plan of care.    I discussed the assessment and treatment plan with the patient. The patient was provided an opportunity to ask questions and all were answered. The patient agreed with the plan and demonstrated  an understanding of the instructions.   The patient was advised to call back or seek an in-person evaluation if the symptoms worsen or if the condition fails to improve as anticipated.      Sharion Balloon, NP  11/22/2019 9:47 AM         Sharion Balloon, NP 11/22/19 [phone]

## 2019-12-01 ENCOUNTER — Other Ambulatory Visit: Payer: Self-pay | Admitting: Nurse Practitioner

## 2019-12-01 DIAGNOSIS — L409 Psoriasis, unspecified: Secondary | ICD-10-CM

## 2019-12-02 ENCOUNTER — Encounter: Payer: Self-pay | Admitting: Nurse Practitioner

## 2019-12-02 DIAGNOSIS — L409 Psoriasis, unspecified: Secondary | ICD-10-CM

## 2019-12-02 MED ORDER — TRIAMCINOLONE ACETONIDE 0.5 % EX OINT: TOPICAL_OINTMENT | CUTANEOUS | 0 refills | Status: DC

## 2019-12-12 ENCOUNTER — Ambulatory Visit (INDEPENDENT_AMBULATORY_CARE_PROVIDER_SITE_OTHER): Payer: BC Managed Care – PPO | Admitting: Dermatology

## 2019-12-12 ENCOUNTER — Other Ambulatory Visit: Payer: Self-pay

## 2019-12-12 ENCOUNTER — Encounter: Payer: Self-pay | Admitting: Dermatology

## 2019-12-12 DIAGNOSIS — L409 Psoriasis, unspecified: Secondary | ICD-10-CM | POA: Diagnosis not present

## 2019-12-12 MED ORDER — HALOBETASOL PROPIONATE 0.05 % EX CREA: TOPICAL_CREAM | CUTANEOUS | 1 refills | Status: DC

## 2019-12-12 NOTE — Progress Notes (Signed)
   New Patient   Subjective  Thomas [name] is a 44 y.o. male who presents for the following: New Patient (Initial Visit) (Patient here today for Psoriasis on left elbow and both lower legs x 1year, patient states that he is itching and some pain on legs.  Dr. Kremer(PCP) gave patient betamethasone did help and triamcinolone didn't help).  Psoriasis Location: Mostly arms and legs Duration: Years Quality:  Associated Signs/Symptoms: Sore joints, obesity Modifying Factors: History of alcoholism Severity:  Timing: Context:    The following portions of the chart were reviewed this encounter and updated as appropriate:     Objective  Well appearing patient in no apparent distress; mood and affect are within normal limits.  A full examination was performed including scalp, head, eyes, ears, nose, lips, neck, chest, axillae, abdomen, back, buttocks, bilateral upper extremities, bilateral lower extremities, hands, feet, fingers, toes, fingernails, and toenails. All findings within normal limits unless otherwise noted below.   Assessment & Plan  First visit for Thomas [name] date of birth 1976/01/27.  He has longstanding psoriasis which is almost always limited to his legs and arms.  Examination showed typical medium size plaques mainly on his right shin and elbow areas.  The scalp, ears, and nails were spared.  Importantly he also reports frequent stiffness and pain in his fingers.  There is also episodic visible swelling.  No frank synovitis today.  We spent considerable time talking about the etiology of psoriasis along with essentially all treatment options.  The benefit of systemic therapy like Biologics is that it would address possible psoriatic arthritis along with the associated metabolic disorders so frequently seen with psoriasis; he could read about this on the website [employer].  His limited skin involvement will likely respond to a class I potency cortisone like  halobetasol cream or ointment.  He will apply this daily to the arms and legs; optional would be addition of a moist wrap over the cream for 20 to 30 minutes.  I also spent some time discussing some new information that I recently read about treatment of obesity.  He is potential candidate for a new semaglutide called Wegovy.  He may discuss this with Dr. Ethelene Hal.  Routine follow-up with me to check progress in 4 to 6weeks, but Thomas [name] knows he can call me with any issues before that time.

## 2019-12-31 ENCOUNTER — Encounter: Payer: Self-pay | Admitting: Dermatology

## 2020-02-06 ENCOUNTER — Ambulatory Visit: Payer: BC Managed Care – PPO | Admitting: Dermatology

## 2020-04-02 ENCOUNTER — Ambulatory Visit: Payer: BC Managed Care – PPO | Admitting: Family Medicine

## 2020-05-09 ENCOUNTER — Encounter: Payer: Self-pay | Admitting: Dermatology

## 2020-05-09 ENCOUNTER — Other Ambulatory Visit: Payer: Self-pay | Admitting: *Deleted

## 2020-05-09 DIAGNOSIS — L409 Psoriasis, unspecified: Secondary | ICD-10-CM

## 2020-05-09 MED ORDER — HALOBETASOL PROPIONATE 0.05 % EX CREA: TOPICAL_CREAM | CUTANEOUS | 1 refills | Status: DC

## 2020-08-08 DIAGNOSIS — E559 Vitamin D deficiency, unspecified: Secondary | ICD-10-CM | POA: Diagnosis not present

## 2020-08-08 DIAGNOSIS — R0602 Shortness of breath: Secondary | ICD-10-CM | POA: Diagnosis not present

## 2020-08-08 DIAGNOSIS — F909 Attention-deficit hyperactivity disorder, unspecified type: Secondary | ICD-10-CM | POA: Diagnosis not present

## 2020-08-08 DIAGNOSIS — Z79899 Other long term (current) drug therapy: Secondary | ICD-10-CM | POA: Diagnosis not present

## 2020-08-08 DIAGNOSIS — Z20822 Contact with and (suspected) exposure to covid-19: Secondary | ICD-10-CM | POA: Diagnosis not present

## 2021-04-22 ENCOUNTER — Encounter: Payer: Self-pay | Admitting: Family Medicine

## 2021-04-24 ENCOUNTER — Encounter: Payer: Self-pay | Admitting: Dermatology

## 2021-06-14 ENCOUNTER — Other Ambulatory Visit: Payer: Self-pay

## 2021-06-14 ENCOUNTER — Encounter: Payer: Self-pay | Admitting: Family Medicine

## 2021-06-14 ENCOUNTER — Ambulatory Visit (INDEPENDENT_AMBULATORY_CARE_PROVIDER_SITE_OTHER): Payer: BC Managed Care – PPO | Admitting: Family Medicine

## 2021-06-14 VITALS — BP 158/90 | HR 75 | Temp 98.2°F | Ht 63.0 in | Wt 283.4 lb

## 2021-06-14 DIAGNOSIS — M1990 Unspecified osteoarthritis, unspecified site: Secondary | ICD-10-CM | POA: Diagnosis not present

## 2021-06-14 DIAGNOSIS — R030 Elevated blood-pressure reading, without diagnosis of hypertension: Secondary | ICD-10-CM

## 2021-06-14 DIAGNOSIS — Z23 Encounter for immunization: Secondary | ICD-10-CM | POA: Insufficient documentation

## 2021-06-14 DIAGNOSIS — Z0000 Encounter for general adult medical examination without abnormal findings: Secondary | ICD-10-CM | POA: Diagnosis not present

## 2021-06-14 DIAGNOSIS — Z789 Other specified health status: Secondary | ICD-10-CM | POA: Insufficient documentation

## 2021-06-14 DIAGNOSIS — G4733 Obstructive sleep apnea (adult) (pediatric): Secondary | ICD-10-CM

## 2021-06-14 DIAGNOSIS — Z6841 Body Mass Index (BMI) 40.0 and over, adult: Secondary | ICD-10-CM

## 2021-06-14 DIAGNOSIS — K759 Inflammatory liver disease, unspecified: Secondary | ICD-10-CM

## 2021-06-14 DIAGNOSIS — L409 Psoriasis, unspecified: Secondary | ICD-10-CM

## 2021-06-14 NOTE — Progress Notes (Addendum)
He yet had a logo on it I really declined okay okay thank you  Established Patient Office Visit  Subjective:  Patient ID: Thomas [name], male    DOB: 12/15/1975  Age: 46 y.o. MRN: [id number]  CC:  Chief Complaint  Patient presents with   Annual Exam    CPE, no concerns. Had breakfast at [place] presents for a health check and follow-up of depression, alcohol use, obesity and health maintenance.  Depression is well controlled with bupropion XL 150 mg.  He would like to continue it.  Is a welcome side effect seems to have helping curb his drinking.  He is down to having 1 drink 2 or 3 times a week.  His mom died since last seen.  Of course he continues to grieve and is seeking counseling.  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  They are planning on walking to start physical exercise.  Psoriasis continues to flare.  He has been experiencing migratory arthralgias.  Denies symmetrical involvement or specific swelling with erythema in any of his joints.  He is seen dermatology next month for discussion of immunotherapy. Not currently using CPAP. Feels as though he is sleeping well without it.   Past Medical History:  Diagnosis Date   Alcoholic fatty liver 11/28/2977   Needs final HBV and HAV vaccines on or after 10/25/2012    Alcoholism ([place]) 12/25/2011   Allergic rhinitis    Childhood asthma    Elevated transaminase level 2009   AST: 80 ALT: 136 in 8/11: Hepatitis A., B and C negative.    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    Sleep apnea     History reviewed. No pertinent surgical history.  Family History  Problem Relation Age of Onset   Liver disease Father    Diabetes Father    Hypertension Father    Liver disease Sister    Diabetes Other     Social History   Socioeconomic History   Marital status: Single    Spouse name: Not on file   Number of children: Not on file    Years of education: Not on file   Highest education level: Not on file  Occupational History   Not on file  Tobacco Use   Smoking status: Some Days    Types: Cigars   Smokeless tobacco: Never   Tobacco comments:    Cigars occasional  Vaping Use   Vaping Use: Never used  Substance and Sexual Activity   Alcohol use: Yes    Comment: Beer/wine 1 - 3 times a week (drinks heavily on weekends)   Drug use: Never    Comment: history of cocaine abuse   Sexual activity: Yes  Other Topics Concern   Not on file  Social History Narrative   Scientist, research (medical).  Single. Gets regular exercise.             Social Determinants of Health   Financial Resource Strain: Not on file  Food Insecurity: Not on file  Transportation Needs: Not on file  Physical Activity: Not on file  Stress: Not on file  Social Connections: Not on file  Intimate Partner Violence: Not on file    Outpatient Medications Prior to Visit  Medication Sig Dispense Refill   buPROPion (WELLBUTRIN XL) 150 MG 24 hr tablet Take 150 mg by  mouth daily.     halobetasol (ULTRAVATE) 0.05 % cream Apply to affected area daily after bathing 50 g 1   triamcinolone ointment (KENALOG) 0.5 % Apply to rash on leg once daily. 30 g 0   ALPRAZolam (XANAX) 0.25 MG tablet Take 45 minutes prior to procedure. Will need driver. (Patient not taking: Reported on 12/12/2019) 1 tablet 0   augmented betamethasone dipropionate (DIPROLENE-AF) 0.05 % cream Apply topically 2 (two) times daily. Use for up to two weeks, stop for at least 1 week and then may restart again. 50 g 1   cephALEXin (KEFLEX) 500 MG capsule Take 1 capsule (500 mg total) by mouth 2 (two) times daily. (Patient not taking: Reported on 09/26/2019) 20 capsule 0   guaiFENesin (MUCINEX) 600 MG 12 hr tablet Take 2 tablets (1,200 mg total) by mouth 2 (two) times daily as needed. 12 tablet 0   ibuprofen (ADVIL) 600 MG tablet Take 1 tablet (600 mg total) by mouth every 6 (six) hours as needed. 30  tablet 0   predniSONE (DELTASONE) 20 MG tablet Take 2 tablets (40 mg total) by mouth daily with breakfast. Hold diprolene while taking this (Patient not taking: Reported on 09/26/2019) 6 tablet 0   No facility-administered medications prior to visit.    Allergies  Allergen Reactions   Codeine     ROS Review of Systems  Constitutional:  Negative for chills, diaphoresis, fatigue, fever and unexpected weight change.  HENT: Negative.    Eyes:  Negative for photophobia and visual disturbance.  Respiratory: Negative.    Cardiovascular: Negative.   Gastrointestinal: Negative.   Genitourinary: Negative.   Musculoskeletal:  Positive for arthralgias. Negative for gait problem.  Skin:  Positive for rash.  Neurological:  Negative for dizziness, speech difficulty and weakness.  Depression screen [facility] 2/9 06/14/2021 06/14/2021 09/26/2019  Decreased Interest 0 0 0  Down, Depressed, Hopeless 0 0 0  PHQ - 2 Score 0 0 0  Altered sleeping 0 - 1  Tired, decreased energy 1 - 2  Change in appetite 0 - 0  Feeling bad or failure about yourself  0 - 0  Trouble concentrating 1 - 2  Moving slowly or fidgety/restless 0 - 0  Suicidal thoughts 0 - 0  PHQ-9 Score 2 - 5  Difficult doing work/chores Somewhat difficult - Somewhat difficult       Objective:    Physical Exam Vitals and nursing note reviewed.  Constitutional:      General: He is not in acute distress.    Appearance: Normal appearance. He is obese. He is not ill-appearing, toxic-appearing or diaphoretic.  HENT:     Head: Normocephalic and atraumatic.     Right Ear: Tympanic membrane, ear canal and external ear normal.     Left Ear: Tympanic membrane, ear canal and external ear normal.     Mouth/Throat:     Mouth: Mucous membranes are moist.     Pharynx: Oropharynx is clear. No oropharyngeal exudate or posterior oropharyngeal erythema.  Eyes:     General: No scleral icterus.       Right eye: No discharge.        Left eye: No discharge.      Extraocular Movements: Extraocular movements intact.     Conjunctiva/sclera: Conjunctivae normal.     Pupils: Pupils are equal, round, and reactive to light.  Neck:     Vascular: No carotid bruit.  Cardiovascular:     Rate and Rhythm: Normal rate and regular rhythm.  Pulmonary:     Effort: Pulmonary effort is normal.     Breath sounds: Normal breath sounds.  Abdominal:     General: Bowel sounds are normal.  Musculoskeletal:     Cervical back: No rigidity or tenderness.  Lymphadenopathy:     Cervical: No cervical adenopathy.  Skin:    General: Skin is warm and dry.  Neurological:     Mental Status: He is alert and oriented to person, place, and time.  Psychiatric:        Mood and Affect: Mood normal.        Behavior: Behavior normal.    BP (!) 158/90 (BP Location: Right Arm, Patient Position: Sitting, Cuff Size: Large)    Pulse 75    Temp 98.2 F (36.8 C) (Temporal)    Ht 5\' 3"  (1.6 m)    Wt 283 lb 6.4 oz (128.5 kg)    SpO2 99%    BMI 50.20 kg/m  Wt Readings from Last 3 Encounters:  06/14/21 283 lb 6.4 oz (128.5 kg)  09/26/19 (!) 312 lb 9.6 oz (141.8 kg)  08/17/19 (!) 313 lb 12.8 oz (142.3 kg)     Health Maintenance Due  Topic Date Due   Pneumococcal Vaccine 70-44 Years old (2 - PCV) 05/06/2017    There are no preventive care reminders to display for this patient.  Lab Results  Component Value Date   TSH 2.85 02/07/2019   Lab Results  Component Value Date   WBC 6.2 06/14/2021   HGB 15.2 06/14/2021   HCT 43.3 06/14/2021   MCV 91.9 06/14/2021   PLT 134 (L) 06/14/2021   Lab Results  Component Value Date   NA 138 06/14/2021   K 4.0 06/14/2021   CO2 25 06/14/2021   GLUCOSE 81 06/14/2021   BUN 8 06/14/2021   CREATININE 0.66 06/14/2021   BILITOT 2.7 (H) 06/14/2021   ALKPHOS 122 (H) 09/26/2019   AST 88 (H) 06/14/2021   ALT 62 (H) 06/14/2021   PROT 8.3 (H) 06/14/2021   ALBUMIN 3.6 09/26/2019   CALCIUM 9.1 06/14/2021   GFR 128.20 02/07/2019   Lab Results   Component Value Date   CHOL 162 06/14/2021   Lab Results  Component Value Date   HDL 56 06/14/2021   Lab Results  Component Value Date   LDLCALC 89 06/14/2021   Lab Results  Component Value Date   TRIG 80 06/14/2021   Lab Results  Component Value Date   CHOLHDL 2.9 06/14/2021   Lab Results  Component Value Date   HGBA1C 4.8 10/12/2007      Assessment & Plan:   Problem List Items Addressed This Visit       Respiratory   OSA (obstructive sleep apnea)     Musculoskeletal and Integument   Psoriasis   Relevant Orders   Ambulatory referral to Rheumatology   Arthritis   Relevant Orders   Rheumatoid Factor (Completed)   Sedimentation rate (Completed)   Uric acid (Completed)     Other   Healthcare maintenance   Relevant Orders   LDL cholesterol, direct (Completed)   Lipid panel (Completed)   Class 3 severe obesity due to excess calories with body mass index (BMI) of 50.0 to 59.9 in adult (HCC)   Elevated BP without diagnosis of hypertension   Relevant Orders   CBC (Completed)   Comprehensive metabolic panel (Completed)   Urinalysis, Routine w reflex microscopic (Completed)   Alcohol use   Need for influenza vaccination - Primary  Relevant Orders   Flu Vaccine QUAD 6+ mos PF IM (Fluarix Quad PF) (Completed)   Other Visit Diagnoses     Inflammatory arthritis       Relevant Orders   Ambulatory referral to Rheumatology   Hepatitis       Relevant Orders   MR Abdomen W Wo Contrast       No orders of the defined types were placed in this encounter.   Follow-up: Return in about 3 months (around 09/12/2021), or check and record blood pressures..  Information was given on health maintenance and disease prevention.  Also information was given on preventing hypertension.  Will check and record his blood pressures over the next few months and follow-up.  Information was given on psoriatic arthritis.  Checking sed rate, rheumatoid factor and uric acid levels.  He  is planning on walking with his father for exercise.  Briefly discussed GLP-1 agonist.  Libby Maw, MD

## 2021-06-15 LAB — CBC
HCT: 43.3 % (ref 38.5–50.0)
Hemoglobin: 15.2 g/dL (ref 13.2–17.1)
MCH: 32.3 pg (ref 27.0–33.0)
MCHC: 35.1 g/dL (ref 32.0–36.0)
MCV: 91.9 fL (ref 80.0–100.0)
MPV: 9.4 fL (ref 7.5–12.5)
Platelets: 134 10*3/uL — ABNORMAL LOW (ref 140–400)
RBC: 4.71 10*6/uL (ref 4.20–5.80)
RDW: 14.7 % (ref 11.0–15.0)
WBC: 6.2 10*3/uL (ref 3.8–10.8)

## 2021-06-15 LAB — COMPREHENSIVE METABOLIC PANEL
AG Ratio: 0.7 (calc) — ABNORMAL LOW (ref 1.0–2.5)
ALT: 62 U/L — ABNORMAL HIGH (ref 9–46)
AST: 88 U/L — ABNORMAL HIGH (ref 10–40)
Albumin: 3.5 g/dL — ABNORMAL LOW (ref 3.6–5.1)
Alkaline phosphatase (APISO): 151 U/L — ABNORMAL HIGH (ref 36–130)
BUN: 8 mg/dL (ref 7–25)
CO2: 25 mmol/L (ref 20–32)
Calcium: 9.1 mg/dL (ref 8.6–10.3)
Chloride: 105 mmol/L (ref 98–110)
Creat: 0.66 mg/dL (ref 0.60–1.29)
Globulin: 4.8 g/dL (calc) — ABNORMAL HIGH (ref 1.9–3.7)
Glucose, Bld: 81 mg/dL (ref 65–99)
Potassium: 4 mmol/L (ref 3.5–5.3)
Sodium: 138 mmol/L (ref 135–146)
Total Bilirubin: 2.7 mg/dL — ABNORMAL HIGH (ref 0.2–1.2)
Total Protein: 8.3 g/dL — ABNORMAL HIGH (ref 6.1–8.1)

## 2021-06-15 LAB — URINALYSIS, ROUTINE W REFLEX MICROSCOPIC
Bilirubin Urine: NEGATIVE
Glucose, UA: NEGATIVE
Hgb urine dipstick: NEGATIVE
Ketones, ur: NEGATIVE
Leukocytes,Ua: NEGATIVE
Nitrite: NEGATIVE
Protein, ur: NEGATIVE
Specific Gravity, Urine: 1.006 (ref 1.001–1.035)
pH: 7 (ref 5.0–8.0)

## 2021-06-15 LAB — LIPID PANEL
Cholesterol: 162 mg/dL (ref <200)
HDL: 56 mg/dL (ref >=40)
LDL Cholesterol (Calc): 89 mg/dL (calc)
Non-HDL Cholesterol (Calc): 106 mg/dL (calc) (ref <130)
Total CHOL/HDL Ratio: 2.9 (calc) (ref <5.0)
Triglycerides: 80 mg/dL (ref <150)

## 2021-06-15 LAB — LDL CHOLESTEROL, DIRECT: Direct LDL: 91 mg/dL (ref <100)

## 2021-06-15 LAB — SEDIMENTATION RATE: Sed Rate: 80 mm/h — ABNORMAL HIGH (ref 0–15)

## 2021-06-15 LAB — RHEUMATOID FACTOR: Rheumatoid fact SerPl-aCnc: <14 IU/mL (ref <14)

## 2021-06-15 LAB — URIC ACID: Uric Acid, Serum: 4.7 mg/dL (ref 4.0–8.0)

## 2021-06-17 NOTE — Addendum Note (Signed)
Addended by: Jon Billings on: 06/17/2021 08:27 AM   Modules accepted: Orders

## 2021-06-17 NOTE — Addendum Note (Signed)
Addended by: Jon Billings on: 06/17/2021 08:21 AM   Modules accepted: Orders

## 2021-06-22 ENCOUNTER — Telehealth (HOSPITAL_BASED_OUTPATIENT_CLINIC_OR_DEPARTMENT_OTHER): Payer: Self-pay

## 2021-06-24 ENCOUNTER — Telehealth (HOSPITAL_BASED_OUTPATIENT_CLINIC_OR_DEPARTMENT_OTHER): Payer: Self-pay

## 2021-06-29 ENCOUNTER — Other Ambulatory Visit (HOSPITAL_BASED_OUTPATIENT_CLINIC_OR_DEPARTMENT_OTHER): Payer: BC Managed Care – PPO

## 2021-07-02 ENCOUNTER — Encounter: Payer: Self-pay | Admitting: Family Medicine

## 2021-07-02 NOTE — Telephone Encounter (Signed)
Please advise message below patient feels like he has a stye on left eye would like something sent ing for this. Pictures attached in chart.

## 2021-07-04 ENCOUNTER — Other Ambulatory Visit: Payer: Self-pay

## 2021-07-05 ENCOUNTER — Encounter: Payer: Self-pay | Admitting: Nurse Practitioner

## 2021-07-05 ENCOUNTER — Ambulatory Visit (INDEPENDENT_AMBULATORY_CARE_PROVIDER_SITE_OTHER): Payer: BC Managed Care – PPO | Admitting: Nurse Practitioner

## 2021-07-05 VITALS — BP 126/76 | HR 83 | Temp 98.5°F | Wt 283.6 lb

## 2021-07-05 DIAGNOSIS — H0015 Chalazion left lower eyelid: Secondary | ICD-10-CM | POA: Diagnosis not present

## 2021-07-05 MED ORDER — ERYTHROMYCIN 5 MG/GM OP OINT: 1.0000 "application " | TOPICAL_OINTMENT | Freq: Three times a day (TID) | OPHTHALMIC | 0 refills | Status: DC

## 2021-07-05 NOTE — Progress Notes (Signed)
Acute Office Visit  Subjective:    Patient ID: Thomas [name], male    DOB: [birth year], 46 y.o.   MRN: [id number]  Chief Complaint  Patient presents with   Stye    Pt c/o painful stye in left eye area x4 days.     HPI Patient is in today for stye in his left eye for 4 days. It started on Saturday with some itching in his left eye. Then on Monday-Tuesday, he noticed his eye lid was swollen. He states the swelling has mostly gone away, however there is a bump in his lower eyelid and it is irritating his eye. He has been using systane dry eye drops which helps some, but not fully. He has noticed some red eye. He denies eye drainage, fevers, and URI symptoms.   Past Medical History:  Diagnosis Date   Alcoholic fatty liver 8/58/8502   Needs final HBV and HAV vaccines on or after 10/25/2012    Alcoholism ([place]) 12/25/2011   Allergic rhinitis    Childhood asthma    Elevated transaminase level 2009   AST: 80 ALT: 136 in 8/11: Hepatitis A., B and C negative.    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 urology.    Sleep apnea     History reviewed. No pertinent surgical history.  Family History  Problem Relation Age of Onset   Liver disease Father    Diabetes Father    Hypertension Father    Liver disease Sister    Diabetes Other     Social History   Socioeconomic History   Marital status: Single    Spouse name: Not on file   Number of children: Not on file   Years of education: Not on file   Highest education level: Not on file  Occupational History   Not on file  Tobacco Use   Smoking status: Some Days    Types: Cigars   Smokeless tobacco: Never   Tobacco comments:    Cigars occasional  Vaping Use   Vaping Use: Never used  Substance and Sexual Activity   Alcohol use: Yes    Comment: Beer/wine 1 - 3 times a week (drinks heavily on weekends)   Drug use: Never    Comment: history of cocaine abuse   Sexual activity: Yes  Other Topics  Concern   Not on file  Social History Narrative   Scientist, research (medical).  Single. Gets regular exercise.             Social Determinants of Health   Financial Resource Strain: Not on file  Food Insecurity: Not on file  Transportation Needs: Not on file  Physical Activity: Not on file  Stress: Not on file  Social Connections: Not on file  Intimate Partner Violence: Not on file    Outpatient Medications Prior to Visit  Medication Sig Dispense Refill   buPROPion (WELLBUTRIN XL) 150 MG 24 hr tablet Take 150 mg by mouth daily.     halobetasol (ULTRAVATE) 0.05 % cream Apply to affected area daily after bathing 50 g 1   triamcinolone ointment (KENALOG) 0.5 % Apply to rash on leg once daily. 30 g 0   No facility-administered medications prior to visit.    Allergies  Allergen Reactions   Codeine     Review of Systems See pertinent positives and negatives per HPI.    Objective:    Physical Exam Vitals and nursing note reviewed.  Constitutional:      Appearance: Normal appearance.  HENT:     Head: Normocephalic.  Eyes:     General:        Left eye: Hordeolum (lower lid) present.    Extraocular Movements: Extraocular movements intact.     Conjunctiva/sclera:     Left eye: Left conjunctiva is injected. No exudate or hemorrhage.    Pupils: Pupils are equal, round, and reactive to light.  Cardiovascular:     Rate and Rhythm: Normal rate and regular rhythm.     Pulses: Normal pulses.     Heart sounds: Normal heart sounds.  Pulmonary:     Effort: Pulmonary effort is normal.     Breath sounds: Normal breath sounds.  Musculoskeletal:     Cervical back: Normal range of motion.  Skin:    General: Skin is warm.  Neurological:     General: No focal deficit present.     Mental Status: He is alert and oriented to person, place, and time.  Psychiatric:        Mood and Affect: Mood normal.        Behavior: Behavior normal.        Thought Content: Thought content normal.         Judgment: Judgment normal.    BP 126/76 (BP Location: Left Arm, Cuff Size: Large)    Pulse 83    Temp 98.5 F (36.9 C) (Temporal)    Wt 283 lb 9.6 oz (128.6 kg)    SpO2 96%    BMI 50.24 kg/m  Wt Readings from Last 3 Encounters:  07/05/21 283 lb 9.6 oz (128.6 kg)  06/14/21 283 lb 6.4 oz (128.5 kg)  09/26/19 (!) 312 lb 9.6 oz (141.8 kg)    Lab Results  Component Value Date   TSH 2.85 02/07/2019   Lab Results  Component Value Date   WBC 6.2 06/14/2021   HGB 15.2 06/14/2021   HCT 43.3 06/14/2021   MCV 91.9 06/14/2021   PLT 134 (L) 06/14/2021   Lab Results  Component Value Date   NA 138 06/14/2021   K 4.0 06/14/2021   CO2 25 06/14/2021   GLUCOSE 81 06/14/2021   BUN 8 06/14/2021   CREATININE 0.66 06/14/2021   BILITOT 2.7 (H) 06/14/2021   ALKPHOS 122 (H) 09/26/2019   AST 88 (H) 06/14/2021   ALT 62 (H) 06/14/2021   PROT 8.3 (H) 06/14/2021   ALBUMIN 3.6 09/26/2019   CALCIUM 9.1 06/14/2021   GFR 128.20 02/07/2019   Lab Results  Component Value Date   CHOL 162 06/14/2021   Lab Results  Component Value Date   HDL 56 06/14/2021   Lab Results  Component Value Date   LDLCALC 89 06/14/2021   Lab Results  Component Value Date   TRIG 80 06/14/2021   Lab Results  Component Value Date   CHOLHDL 2.9 06/14/2021   Lab Results  Component Value Date   HGBA1C 4.8 10/12/2007       Assessment & Plan:   Problem List Items Addressed This Visit   None Visit Diagnoses     Chalazion of left lower eyelid    -  Primary   Chalazion vs hordeolum in the left lower eye lid. Start erythromycin ointment TID for 5 days. Use warm compresses BID. Follow up with eye dr if not improving       Meds ordered this encounter  Medications   erythromycin ophthalmic ointment    Sig: Place 1 application  into the left eye 3 (three) times daily. For 5 days    Dispense:  14 g    Refill:  [place], NP

## 2021-07-05 NOTE — Patient Instructions (Signed)
It was great to see you!  Start antibiotic eye ointment 3 times a day for 5 days.  You can use warm compresses to your eye twice a day for 5-10 minutes  Follow up with PCP or eye doctor if not improving  Take care,  Vance Peper, NP

## 2021-08-01 ENCOUNTER — Other Ambulatory Visit: Payer: Self-pay

## 2021-08-01 ENCOUNTER — Encounter (HOSPITAL_COMMUNITY): Payer: Self-pay | Admitting: Emergency Medicine

## 2021-08-01 ENCOUNTER — Emergency Department (HOSPITAL_COMMUNITY)
Admission: EM | Admit: 2021-08-01 | Discharge: 2021-08-01 | Disposition: A | Discharge status: Home / Self Care | Payer: BC Managed Care – PPO | Attending: Emergency Medicine | Admitting: Emergency Medicine

## 2021-08-01 DIAGNOSIS — D649 Anemia, unspecified: Secondary | ICD-10-CM | POA: Diagnosis not present

## 2021-08-01 DIAGNOSIS — R197 Diarrhea, unspecified: Secondary | ICD-10-CM | POA: Diagnosis not present

## 2021-08-01 DIAGNOSIS — A09 Infectious gastroenteritis and colitis, unspecified: Secondary | ICD-10-CM

## 2021-08-01 LAB — CBC WITH DIFFERENTIAL/PLATELET
Abs Immature Granulocytes: 0.15 10*3/uL — ABNORMAL HIGH (ref 0.00–0.07)
Basophils Absolute: 0 10*3/uL (ref 0.0–0.1)
Basophils Relative: 0 %
Eosinophils Absolute: 0 10*3/uL (ref 0.0–0.5)
Eosinophils Relative: 0 %
HCT: 28.8 % — ABNORMAL LOW (ref 39.0–52.0)
Hemoglobin: 10 g/dL — ABNORMAL LOW (ref 13.0–17.0)
Immature Granulocytes: 1 %
Lymphocytes Relative: 17 %
Lymphs Abs: 2 10*3/uL (ref 0.7–4.0)
MCH: 33.2 pg (ref 26.0–34.0)
MCHC: 34.7 g/dL (ref 30.0–36.0)
MCV: 95.7 fL (ref 80.0–100.0)
Monocytes Absolute: 1.1 10*3/uL — ABNORMAL HIGH (ref 0.1–1.0)
Monocytes Relative: 9 %
Neutro Abs: 8.7 10*3/uL — ABNORMAL HIGH (ref 1.7–7.7)
Neutrophils Relative %: 73 %
Platelets: 178 10*3/uL (ref 150–400)
RBC: 3.01 MIL/uL — ABNORMAL LOW (ref 4.22–5.81)
RDW: 16.8 % — ABNORMAL HIGH (ref 11.5–15.5)
WBC: 12 10*3/uL — ABNORMAL HIGH (ref 4.0–10.5)
nRBC: 1.2 % — ABNORMAL HIGH (ref 0.0–0.2)

## 2021-08-01 LAB — URINALYSIS, ROUTINE W REFLEX MICROSCOPIC
Bacteria, UA: NONE SEEN
Bilirubin Urine: NEGATIVE
Glucose, UA: NEGATIVE mg/dL
Hgb urine dipstick: NEGATIVE
Ketones, ur: NEGATIVE mg/dL
Leukocytes,Ua: NEGATIVE
Nitrite: NEGATIVE
Protein, ur: 30 mg/dL — AB
Specific Gravity, Urine: 1.032 — ABNORMAL HIGH (ref 1.005–1.030)
pH: 5 (ref 5.0–8.0)

## 2021-08-01 LAB — COMPREHENSIVE METABOLIC PANEL
ALT: 47 U/L — ABNORMAL HIGH (ref 0–44)
AST: 57 U/L — ABNORMAL HIGH (ref 15–41)
Albumin: 2.7 g/dL — ABNORMAL LOW (ref 3.5–5.0)
Alkaline Phosphatase: 79 U/L (ref 38–126)
Anion gap: 8 (ref 5–15)
BUN: 19 mg/dL (ref 6–20)
CO2: 25 mmol/L (ref 22–32)
Calcium: 8.5 mg/dL — ABNORMAL LOW (ref 8.9–10.3)
Chloride: 106 mmol/L (ref 98–111)
Creatinine, Ser: 0.78 mg/dL (ref 0.61–1.24)
GFR, Estimated: >60 mL/min (ref >60)
Glucose, Bld: 108 mg/dL — ABNORMAL HIGH (ref 70–99)
Potassium: 3.4 mmol/L — ABNORMAL LOW (ref 3.5–5.1)
Sodium: 139 mmol/L (ref 135–145)
Total Bilirubin: 2.1 mg/dL — ABNORMAL HIGH (ref 0.3–1.2)
Total Protein: 6.3 g/dL — ABNORMAL LOW (ref 6.5–8.1)

## 2021-08-01 LAB — LIPASE, BLOOD: Lipase: 96 U/L — ABNORMAL HIGH (ref 11–51)

## 2021-08-01 MED ORDER — PANTOPRAZOLE SODIUM 40 MG PO TBEC
40.0000 mg | DELAYED_RELEASE_TABLET | Freq: Once | ORAL | Status: AC
  Administered 2021-08-01: 40 mg via ORAL
  Filled 2021-08-01: qty 1

## 2021-08-01 MED ORDER — LACTATED RINGERS IV BOLUS
1000.0000 mL | Freq: Once | INTRAVENOUS | Status: AC
  Administered 2021-08-01: 1000 mL via INTRAVENOUS

## 2021-08-01 MED ORDER — PANTOPRAZOLE SODIUM 40 MG PO TBEC: 40.0000 mg | DELAYED_RELEASE_TABLET | Freq: Every day | ORAL | 0 refills | Status: DC

## 2021-08-01 MED ORDER — POTASSIUM CHLORIDE CRYS ER 20 MEQ PO TBCR
40.0000 meq | EXTENDED_RELEASE_TABLET | Freq: Once | ORAL | Status: AC
  Administered 2021-08-01: 40 meq via ORAL
  Filled 2021-08-01: qty 2

## 2021-08-01 NOTE — Discharge Instructions (Addendum)
Your hemoglobin is moderately low today at 10.  He should get this rechecked next week by your primary care physician.  If you develop new or worsening bleeding, vomiting, blood in the stool or diarrhea, or any other new/concerning symptoms then return to the ER for evaluation.

## 2021-08-01 NOTE — ED Notes (Signed)
Pt verbalized understanding of d/c instructions, meds, and followup care. Denies questions. VSS, no distress noted. Steady gait to exit with all belongings.  ?

## 2021-08-01 NOTE — ED Triage Notes (Signed)
Pt reported to ED with c/o diarrhea 3 days ago that was shortly followed by blood tinged emesis. Pt states symptoms occurred while in [place], has recently returned today and denies any symptoms at this time, states issues have resolved. Also endorsed epigastric pain while symptoms where occurring.

## 2021-08-01 NOTE — ED Provider Triage Note (Signed)
Emergency Medicine Provider Triage Evaluation Note  Thomas [name] , a 46 y.o. male  was evaluated in triage.  Pt complains of nausea, vomiting, and diarrhea.  Patient states he was in [place] and stayed with family while he got sick.  He started experiencing diarrhea, nausea, and vomiting.  He experienced this continuously for 4 days and on the last day, he noticed a small amount of blood in his vomit.  He denies any bloody stools.  He has not had any symptoms for 2 days, however he has not had any p.o. intake because he feels generally unwell and he also has generalized weakness.  Patient did not take any prophylactic antibiotics for traveling.  He also is had the symptoms.  He denies any abdominal pain, fevers, or chills.   Review of Systems  Positive: Diarrhea, nausea, vomiting, hematemesis, weakness Negative:   Physical Exam  BP (!) 181/85 (BP Location: Right Arm)    Pulse 95    Temp 98.3 F (36.8 C) (Oral)    Resp 17    SpO2 99%  Gen:   Awake, no distress   Resp:  Normal effort  MSK:   Moves extremities without difficulty  Other:  Abdomen soft, nontender  Medical Decision Making  Medically screening exam initiated at 8:04 PM.  Appropriate orders placed.  Thomas [name] was informed that the remainder of the evaluation will be completed by another provider, this initial triage assessment does not replace that evaluation, and the importance of remaining in the ED until their evaluation is complete.  May be travelers diarrhea. Likely dehydrated. Abdominal labs.    Sheila Oats 08/01/21 2009

## 2021-08-01 NOTE — ED Provider Notes (Signed)
[facility] EMERGENCY DEPARTMENT Provider Note   CSN: [id number] Arrival date & time: 08/01/21  1924     History  Chief Complaint  Patient presents with   Hematemesis   Diarrhea    Thomas [name] is a 46 y.o. male.  HPI 46 year old male presents with vomiting and diarrhea.  He went to [place] on travel and starting 6 days ago he noticed some diarrhea that seem to resolve and he turned into vomiting.  The first time he vomited there was no blood but after that there was a lot of blood per him.  Stop vomiting a couple days ago.  Got back today and so he went to get checked out because he has been feeling weak for couple days.  No GI symptoms for the last couple days.  No abdominal pain.  Thinks he might had a fever during this course but no measured temperature.  Never had any blood in his stool.  No black or bloody stools.  Yesterday he had a normal bowel movement.  Feels generally weak.  Home Medications Prior to Admission medications   Medication Sig Start Date End Date Taking? Authorizing Provider  pantoprazole (PROTONIX) 40 MG tablet Take 1 tablet (40 mg total) by mouth daily. 08/01/21  Yes Sherwood Gambler, MD  buPROPion (WELLBUTRIN XL) 150 MG 24 hr tablet Take 150 mg by mouth daily.    [provider]  erythromycin ophthalmic ointment Place 1 application into the left eye 3 (three) times daily. For 5 days 07/05/21   Charyl Dancer, NP  halobetasol (ULTRAVATE) 0.05 % cream Apply to affected area daily after bathing 05/09/20   Lavonna Monarch, MD  triamcinolone ointment (KENALOG) 0.5 % Apply to rash on leg once daily. 12/02/19   Libby Maw, MD      Allergies    Codeine    Review of Systems   Review of Systems  Constitutional:  Negative for fever.  Gastrointestinal:  Positive for abdominal pain, diarrhea and vomiting. Negative for blood in stool.   Physical Exam Updated Vital Signs BP (!) 149/68    Pulse 86    Temp 98.3 F (36.8 C)  (Oral)    Resp 16    Ht 5\' 5"  (1.651 m)    Wt 129.3 kg    SpO2 100%    BMI 47.43 kg/m  Physical Exam Vitals and nursing note reviewed.  Constitutional:      General: He is not in acute distress.    Appearance: He is well-developed. He is obese. He is not ill-appearing or diaphoretic.  HENT:     Head: Normocephalic and atraumatic.  Cardiovascular:     Rate and Rhythm: Normal rate and regular rhythm.     Heart sounds: Normal heart sounds.  Pulmonary:     Effort: Pulmonary effort is normal.     Breath sounds: Normal breath sounds.  Abdominal:     General: There is no distension.     Palpations: Abdomen is soft.     Tenderness: There is no abdominal tenderness.  Skin:    General: Skin is warm and dry.  Neurological:     Mental Status: He is alert.    ED Results / Procedures / Treatments   Labs (all labs ordered are listed, but only abnormal results are displayed) Labs Reviewed  CBC WITH DIFFERENTIAL/PLATELET - Abnormal; Notable for the following components:      Result Value   WBC 12.0 (*)    RBC 3.01 (*)  Hemoglobin 10.0 (*)    HCT 28.8 (*)    RDW 16.8 (*)    nRBC 1.2 (*)    Neutro Abs 8.7 (*)    Monocytes Absolute 1.1 (*)    Abs Immature Granulocytes 0.15 (*)    All other components within normal limits  COMPREHENSIVE METABOLIC PANEL - Abnormal; Notable for the following components:   Potassium 3.4 (*)    Glucose, Bld 108 (*)    Calcium 8.5 (*)    Total Protein 6.3 (*)    Albumin 2.7 (*)    AST 57 (*)    ALT 47 (*)    Total Bilirubin 2.1 (*)    All other components within normal limits  LIPASE, BLOOD - Abnormal; Notable for the following components:   Lipase 96 (*)    All other components within normal limits  URINALYSIS, ROUTINE W REFLEX MICROSCOPIC - Abnormal; Notable for the following components:   Color, Urine AMBER (*)    Specific Gravity, Urine 1.032 (*)    Protein, ur 30 (*)    All other components within normal limits  HEPATITIS PANEL, ACUTE     EKG None  Radiology No results found.  Procedures Procedures    Medications Ordered in ED Medications  lactated ringers bolus 1,000 mL (1,000 mLs Intravenous New Bag/Given 08/01/21 2240)  potassium chloride SA (KLOR-CON M) CR tablet 40 mEq (40 mEq Oral Given 08/01/21 2304)  pantoprazole (PROTONIX) EC tablet 40 mg (40 mg Oral Given 08/01/21 2304)    ED Course/ Medical Decision Making/ A&P                            Patient is well-appearing here.  He was initially quite hypertensive in triage but this has improved and while still hypertensive it is much better.  Otherwise exam is unremarkable.  Benign abdominal exam.  Sound like he has traveler's diarrhea though his symptoms have been resolved for the last 2 days.  He is feeling better with IV fluids.  Potassium was noted to be a little low so this was repleted and he was given Protonix.  His hemoglobin is 10 which is low compared to 15 a month ago.  However with no GI symptoms in a couple days I highly doubt he is still having any bleeding or blood loss.  I offered to do rectal exam but he declines.  At this point, we decided to start Protonix and have him follow-up with his PCP for recheck of his hemoglobin next week.  No indication for antibiotics or other prescriptions given the GI illness seems to have stopped.  Will discharge home with return precautions.        Final Clinical Impression(s) / ED Diagnoses Final diagnoses:  Anemia, unspecified type  Traveler's diarrhea    Rx / DC Orders ED Discharge Orders          Ordered    pantoprazole (PROTONIX) 40 MG tablet  Daily        08/01/21 2341              Sherwood Gambler, MD 08/01/21 2349

## 2021-08-02 LAB — HEPATITIS PANEL, ACUTE
HCV Ab: NONREACTIVE
Hep A IgM: NONREACTIVE
Hep B C IgM: NONREACTIVE
Hepatitis B Surface Ag: NONREACTIVE

## 2021-08-05 ENCOUNTER — Encounter: Payer: Self-pay | Admitting: Family Medicine

## 2021-08-06 ENCOUNTER — Other Ambulatory Visit: Payer: Self-pay

## 2021-08-06 ENCOUNTER — Ambulatory Visit (INDEPENDENT_AMBULATORY_CARE_PROVIDER_SITE_OTHER): Payer: BC Managed Care – PPO | Admitting: Dermatology

## 2021-08-06 ENCOUNTER — Encounter: Payer: Self-pay | Admitting: Dermatology

## 2021-08-06 DIAGNOSIS — L4050 Arthropathic psoriasis, unspecified: Secondary | ICD-10-CM | POA: Diagnosis not present

## 2021-08-06 DIAGNOSIS — L400 Psoriasis vulgaris: Secondary | ICD-10-CM | POA: Diagnosis not present

## 2021-08-12 ENCOUNTER — Other Ambulatory Visit: Payer: Self-pay

## 2021-08-12 ENCOUNTER — Encounter: Payer: Self-pay | Admitting: Family Medicine

## 2021-08-12 ENCOUNTER — Ambulatory Visit (INDEPENDENT_AMBULATORY_CARE_PROVIDER_SITE_OTHER): Payer: BC Managed Care – PPO | Admitting: Family Medicine

## 2021-08-12 VITALS — BP 110/66 | HR 86 | Temp 98.8°F | Ht 65.0 in | Wt 279.4 lb

## 2021-08-12 DIAGNOSIS — Z111 Encounter for screening for respiratory tuberculosis: Secondary | ICD-10-CM

## 2021-08-12 DIAGNOSIS — Z09 Encounter for follow-up examination after completed treatment for conditions other than malignant neoplasm: Secondary | ICD-10-CM | POA: Insufficient documentation

## 2021-08-12 DIAGNOSIS — D539 Nutritional anemia, unspecified: Secondary | ICD-10-CM | POA: Insufficient documentation

## 2021-08-12 DIAGNOSIS — R7989 Other specified abnormal findings of blood chemistry: Secondary | ICD-10-CM | POA: Diagnosis not present

## 2021-08-12 NOTE — Progress Notes (Addendum)
? ?Established Patient Office Visit ? ?Subjective:  ?Patient ID: Thomas [name], male    DOB: 1975-09-02  Age: 46 y.o. MRN: [id number] ? ?CC:  ?Chief Complaint  ?Patient presents with  ? Hospitalization Follow-up  ?  Hospital follow up seen for dehydration and diarrhea.   ? ? ?HPI ?Thomas [name] presents for hospital discharge follow-up 2 weeks ago when he had been treated for dehydration and travelers diarrhea after visiting [place].  He is recovered.  There is no longer any vomiting and his stools are now formed.  Review of the chart shows dehydration with a macrocytic anemia and elevated liver enzymes.  Hemoglobin was measured at just over 10 when it had been 15 a few months before.  He denied hematochezia or melena.  Acute hepatitis panel was negative.  Fortunately he has decreased his alcohol intake significantly he tells me.  Consumes no more than 1 or 2 in a given setting.  He [birth year] his birthday this past Saturday and did consume slightly more than that. ? ?Past Medical History:  ?Diagnosis Date  ? Alcoholic fatty liver 01/31/538  ? Needs final HBV and HAV vaccines on or after 10/25/2012   ? Alcoholism ([place]) 12/25/2011  ? Allergic rhinitis   ? Childhood asthma   ? Elevated transaminase level 2009  ? AST: 80 ALT: 136 in 8/11: Hepatitis A., B and C negative.   ? Morbid obesity ([place])   ? 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 Followed at [facility]rology.   ? Sleep apnea   ? ? ?History reviewed. No pertinent surgical history. ? ?Family History  ?Problem Relation Age of Onset  ? Liver disease Father   ? Diabetes Father   ? Hypertension Father   ? Liver disease Sister   ? Diabetes Other   ? ? ?Social History  ? ?Socioeconomic History  ? Marital status: Single  ?  Spouse name: Not on file  ? Number of children: Not on file  ? Years of education: Not on file  ? Highest education level: Not on file  ?Occupational History  ? Not on file  ?Tobacco Use  ? Smoking status: Some Days  ?  Types: Cigars  ? Smokeless tobacco: Never  ?  Tobacco comments:  ?  Cigars occasional  ?Vaping Use  ? Vaping Use: Never used  ?Substance and Sexual Activity  ? Alcohol use: Yes  ?  Comment: Beer/wine 1 - 3 times a week (drinks heavily on weekends)  ? Drug use: Never  ?  Comment: history of cocaine abuse  ? Sexual activity: Yes  ?Other Topics Concern  ? Not on file  ?Social History Narrative  ? Scientist, research (medical).  Single. Gets regular exercise.   ?   ?   ?   ? ?Social Determinants of Health  ? ?Financial Resource Strain: Not on file  ?Food Insecurity: Not on file  ?Transportation Needs: Not on file  ?Physical Activity: Not on file  ?Stress: Not on file  ?Social Connections: Not on file  ?Intimate Partner Violence: Not on file  ? ? ?Outpatient Medications Prior to Visit  ?Medication Sig Dispense Refill  ? buPROPion (WELLBUTRIN XL) 150 MG 24 hr tablet Take 150 mg by mouth daily.    ? halobetasol (ULTRAVATE) 0.05 % cream Apply to affected area daily after bathing 50 g 1  ? triamcinolone ointment (KENALOG) 0.5 % Apply to rash on leg once daily. 30 g 0  ? erythromycin ophthalmic ointment Place 1 application into  the left eye 3 (three) times daily. For 5 days 14 g 0  ? pantoprazole (PROTONIX) 40 MG tablet Take 1 tablet (40 mg total) by mouth daily. (Patient not taking: Reported on 08/12/2021) 14 tablet 0  ? ?No facility-administered medications prior to visit.  ? ? ?Allergies  ?Allergen Reactions  ? Codeine   ? ? ?ROS ?Review of Systems  ?Constitutional:  Negative for diaphoresis, fatigue, fever and unexpected weight change.  ?HENT: Negative.    ?Eyes:  Negative for photophobia and visual disturbance.  ?Respiratory: Negative.    ?Cardiovascular: Negative.   ?Gastrointestinal: Negative.  Negative for abdominal pain, anal bleeding, blood in stool, diarrhea, nausea and vomiting.  ?Genitourinary: Negative.  Negative for hematuria.  ?Neurological: Negative.   ? ?  ?Objective:  ?  ?Physical Exam ?Vitals and nursing note reviewed.  ?Constitutional:   ?   General: He is  not in acute distress. ?   Appearance: Normal appearance. He is obese. He is not ill-appearing, toxic-appearing or diaphoretic.  ?HENT:  ?   Head: Normocephalic and atraumatic.  ?   Right Ear: External ear normal.  ?   Left Ear: External ear normal.  ?   Mouth/Throat:  ?   Mouth: Mucous membranes are dry.  ?   Pharynx: Oropharynx is clear. No oropharyngeal exudate or posterior oropharyngeal erythema.  ?Eyes:  ?   General: No scleral icterus.    ?   Right eye: No discharge.     ?   Left eye: No discharge.  ?   Extraocular Movements: Extraocular movements intact.  ?   Conjunctiva/sclera: Conjunctivae normal.  ?   Pupils: Pupils are equal, round, and reactive to light.  ?Neck:  ?   Vascular: No carotid bruit.  ?Cardiovascular:  ?   Rate and Rhythm: Normal rate and regular rhythm.  ?Pulmonary:  ?   Effort: Pulmonary effort is normal.  ?   Breath sounds: Normal breath sounds.  ?Abdominal:  ?   General: Bowel sounds are normal. There is no distension.  ?   Palpations: There is no mass.  ?   Tenderness: There is no abdominal tenderness. There is no guarding or rebound.  ?   Hernia: No hernia is present.  ?Musculoskeletal:  ?   Cervical back: No rigidity or tenderness.  ?Lymphadenopathy:  ?   Cervical: No cervical adenopathy.  ?Skin: ?   General: Skin is warm and dry.  ?Neurological:  ?   Mental Status: He is alert and oriented to person, place, and time.  ?Psychiatric:     ?   Mood and Affect: Mood normal.     ?   Behavior: Behavior normal.  ? ? ?BP 110/66 (BP Location: Right Arm, Patient Position: Sitting, Cuff Size: Large)   Pulse 86   Temp 98.8 ?F (37.1 ?C) (Temporal)   Ht '5\' 5"'$  (1.651 m)   Wt 279 lb 6.4 oz (126.7 kg)   SpO2 97%   BMI 46.49 kg/m?  ?Wt Readings from Last 3 Encounters:  ?08/12/21 279 lb 6.4 oz (126.7 kg)  ?08/01/21 285 lb (129.3 kg)  ?07/05/21 283 lb 9.6 oz (128.6 kg)  ? ? ? ?There are no preventive care reminders to display for this patient. ? ?There are no preventive care reminders to display for  this patient. ? ?Lab Results  ?Component Value Date  ? TSH 2.85 02/07/2019  ? ?Lab Results  ?Component Value Date  ? WBC 10.5 08/12/2021  ? HGB 10.4 (L) 08/12/2021  ?  HCT 30.6 (L) 08/12/2021  ? MCV 91.9 08/12/2021  ? PLT 201.0 08/12/2021  ? ?Lab Results  ?Component Value Date  ? NA 140 08/12/2021  ? K 4.0 08/12/2021  ? CO2 30 08/12/2021  ? GLUCOSE 91 08/12/2021  ? BUN 20 08/12/2021  ? CREATININE 0.70 08/12/2021  ? BILITOT 1.9 (H) 08/12/2021  ? ALKPHOS 155 (H) 08/12/2021  ? AST 63 (H) 08/12/2021  ? ALT 83 (H) 08/12/2021  ? PROT 6.4 08/12/2021  ? ALBUMIN 3.2 (L) 08/12/2021  ? CALCIUM 8.6 08/12/2021  ? ANIONGAP 8 08/01/2021  ? GFR 112.46 08/12/2021  ? ?Lab Results  ?Component Value Date  ? CHOL 162 06/14/2021  ? ?Lab Results  ?Component Value Date  ? HDL 56 06/14/2021  ? ?Lab Results  ?Component Value Date  ? Nambe 89 06/14/2021  ? ?Lab Results  ?Component Value Date  ? TRIG 80 06/14/2021  ? ?Lab Results  ?Component Value Date  ? CHOLHDL 2.9 06/14/2021  ? ?Lab Results  ?Component Value Date  ? HGBA1C 4.8 10/12/2007  ? ? ?  ?Assessment & Plan:  ? ?Problem List Items Addressed This Visit   ? ?  ? Other  ? Hospital discharge follow-up - Primary  ? Relevant Orders  ? CBC (Completed)  ? Basic metabolic panel (Completed)  ? Urinalysis, Routine w reflex microscopic  ? Macrocytic anemia  ? Relevant Orders  ? CBC (Completed)  ? B12 and Folate Panel (Completed)  ? Screening for tuberculosis  ? Relevant Orders  ? QuantiFERON-TB Gold Plus  ? Elevated LFTs  ? Relevant Orders  ? CBC (Completed)  ? Hepatic function panel (Completed)  ? Ambulatory referral to Gastroenterology  ? ? ?No orders of the defined types were placed in this encounter. ? ? ?Follow-up: Return in about 6 months (around 02/12/2022), or return for physical exam.  ?Continue moderation of alcohol.  Follow-up for recheck in 6 months or sooner if you do not continue to improve. ? ?Libby Maw, MD ?

## 2021-08-12 NOTE — Addendum Note (Signed)
Addended by: Beryle Lathe S on: 08/12/2021 04:18 PM ? ? Modules accepted: Orders ? ?

## 2021-08-13 LAB — BASIC METABOLIC PANEL
BUN: 20 mg/dL (ref 6–23)
CO2: 30 mEq/L (ref 19–32)
Calcium: 8.6 mg/dL (ref 8.4–10.5)
Chloride: 105 mEq/L (ref 96–112)
Creatinine, Ser: 0.7 mg/dL (ref 0.40–1.50)
GFR: 112.46 mL/min (ref >60.00)
Glucose, Bld: 91 mg/dL (ref 70–99)
Potassium: 4 mEq/L (ref 3.5–5.1)
Sodium: 140 mEq/L (ref 135–145)

## 2021-08-13 LAB — HEPATIC FUNCTION PANEL
ALT: 83 U/L — ABNORMAL HIGH (ref 0–53)
AST: 63 U/L — ABNORMAL HIGH (ref 0–37)
Albumin: 3.2 g/dL — ABNORMAL LOW (ref 3.5–5.2)
Alkaline Phosphatase: 155 U/L — ABNORMAL HIGH (ref 39–117)
Bilirubin, Direct: 0.5 mg/dL — ABNORMAL HIGH (ref 0.0–0.3)
Total Bilirubin: 1.9 mg/dL — ABNORMAL HIGH (ref 0.2–1.2)
Total Protein: 6.4 g/dL (ref 6.0–8.3)

## 2021-08-13 LAB — CBC
HCT: 30.6 % — ABNORMAL LOW (ref 39.0–52.0)
Hemoglobin: 10.4 g/dL — ABNORMAL LOW (ref 13.0–17.0)
MCHC: 33.8 g/dL (ref 30.0–36.0)
MCV: 91.9 fl (ref 78.0–100.0)
Platelets: 201 10*3/uL (ref 150.0–400.0)
RBC: 3.33 Mil/uL — ABNORMAL LOW (ref 4.22–5.81)
RDW: 16.7 % — ABNORMAL HIGH (ref 11.5–15.5)
WBC: 10.5 10*3/uL (ref 4.0–10.5)

## 2021-08-13 LAB — B12 AND FOLATE PANEL
Folate: 11.1 ng/mL (ref >5.9)
Vitamin B-12: >1504 pg/mL — ABNORMAL HIGH (ref 211–911)

## 2021-08-14 ENCOUNTER — Encounter: Payer: Self-pay | Admitting: Family Medicine

## 2021-08-15 DIAGNOSIS — Z111 Encounter for screening for respiratory tuberculosis: Secondary | ICD-10-CM | POA: Insufficient documentation

## 2021-08-15 DIAGNOSIS — R7989 Other specified abnormal findings of blood chemistry: Secondary | ICD-10-CM | POA: Insufficient documentation

## 2021-08-15 NOTE — Addendum Note (Signed)
Addended by: Abelino Derrick A on: 08/15/2021 11:05 AM ? ? Modules accepted: Orders ? ?

## 2021-08-16 ENCOUNTER — Encounter: Payer: Self-pay | Admitting: Dermatology

## 2021-08-16 LAB — QUANTIFERON-TB GOLD PLUS
Mitogen-NIL: >10 IU/mL
NIL: 0.03 IU/mL
QuantiFERON-TB Gold Plus: NEGATIVE
TB1-NIL: 0 IU/mL
TB2-NIL: 0 IU/mL

## 2021-08-16 NOTE — Progress Notes (Signed)
° °  Follow-Up Visit   Subjective  Thomas [name] is a 46 y.o. male who presents for the following: Follow-up (F/u for psoriasis per pt its now hurting his joints. Left leg gets really red and cant walk. ).  Persistent skin psoriasis with increasing joint symptoms Location:  Duration:  Quality:  Associated Signs/Symptoms: Modifying Factors:  Severity:  Timing: Context:   Objective  Well appearing patient in no apparent distress; mood and affect are within normal limits. Left Leg, Right Elbow - Posterior, Right Leg Although the plaque psoriasis is only involving 10% of his body surface area, with development of increasing joint symptoms I do feel that systemic intervention is indicated.  He was strongly encouraged to speak with his PCP about referral to a rheumatologist, but since the this may take months I will initiate therapy.  He has had recent complete lab profile except for TB test so this will be obtained.  Unbiased source for doing reading on Biologics was provided and we will try to get him approved for [facility].    A focused examination was performed including waist up plus legs.  Skin and joints.. Relevant physical exam findings are noted in the Assessment and Plan.   Assessment & Plan    Psoriasis vulgaris Right Elbow - Posterior; Left Leg; Right Leg  Per Dr. Denna Haggard we only need tb gold because patient had lab panel done at PCP.  We will try to get authorized for [facility] therapy.  QuantiFERON-TB Gold Plus - Left Leg, Right Elbow - Posterior, Right Leg      I, Lavonna Monarch, MD, have reviewed all documentation for this visit.  The documentation on 08/16/21 for the exam, diagnosis, procedures, and orders are all accurate and complete.

## 2021-08-19 ENCOUNTER — Telehealth: Payer: Self-pay

## 2021-08-19 NOTE — Telephone Encounter (Signed)
Skyrizi new prescription sent through the [employer] and [employer] paperwork faxed to [employer].  ?

## 2021-08-20 ENCOUNTER — Encounter: Payer: Self-pay | Admitting: Gastroenterology

## 2021-08-20 NOTE — Telephone Encounter (Signed)
Fax received from [facility] stating that they did a benefits investigation and the patient's insurance company needs a prior authorization done before they will approve the medication. Per Iantha Fallen they will send the insurance company the prior authorization.  ?

## 2021-08-21 ENCOUNTER — Telehealth: Payer: Self-pay | Admitting: *Deleted

## 2021-08-21 NOTE — Telephone Encounter (Signed)
Check status of skyrizi per senderra ? ?For the [employer]- i'm showing that the PA was submitted to the [employer]- it is currently pending/in review- we are just waiting for a determination to be made- we are set to follow up with the health plan tomorrow 3/16 to check status.  ? ? ? ?

## 2021-08-26 ENCOUNTER — Telehealth: Payer: Self-pay

## 2021-08-26 ENCOUNTER — Other Ambulatory Visit: Payer: Self-pay

## 2021-08-26 ENCOUNTER — Ambulatory Visit (INDEPENDENT_AMBULATORY_CARE_PROVIDER_SITE_OTHER): Payer: BC Managed Care – PPO | Admitting: Family Medicine

## 2021-08-26 ENCOUNTER — Encounter: Payer: Self-pay | Admitting: Family Medicine

## 2021-08-26 VITALS — BP 122/58 | HR 92 | Temp 98.4°F | Ht 65.0 in | Wt 279.2 lb

## 2021-08-26 DIAGNOSIS — D649 Anemia, unspecified: Secondary | ICD-10-CM | POA: Diagnosis not present

## 2021-08-26 DIAGNOSIS — K529 Noninfective gastroenteritis and colitis, unspecified: Secondary | ICD-10-CM | POA: Diagnosis not present

## 2021-08-26 DIAGNOSIS — D62 Acute posthemorrhagic anemia: Secondary | ICD-10-CM | POA: Insufficient documentation

## 2021-08-26 HISTORY — DX: Noninfective gastroenteritis and colitis, unspecified: K52.9

## 2021-08-26 NOTE — Telephone Encounter (Signed)
Fax from [facility] stating that the patient's insurance requires him to use [employer]. Per Iantha Fallen they will transfer the patient's prescription. ?

## 2021-08-26 NOTE — Progress Notes (Signed)
? ?Established Patient Office Visit ? ?Subjective:  ?Patient ID: Thomas [name], male    DOB: Apr 30, 1976  Age: 46 y.o. MRN: [id number] ? ?CC:  ?Chief Complaint  ?Patient presents with  ?? Follow-up  ?  Follow up stomach issues  pt.c/o excessive diarrhea still having pain in stomach. Also having issues swelling with of legs.   ? ? ?HPI ?Thomas [name] presents for follow-up of abdominal pain with bloating.  Was seen back on the sixth of this month.  Things have not improved since that time.  He had said that his stool was formed and things were improving but on second thought he feels as though things have not improved.  Abdominal bloating continues.  There is occasional midepigastric pain.  Stool has been liquid semiformed and hard.  It varies from 1 bowel movement to the next.  There has been no fever chills nausea or vomiting.  His appointment for elevated liver enzymes with GI is in a few weeks.   ?Past Medical History:  ?Diagnosis Date  ?? Alcoholic fatty liver 09/15/1446  ? Needs final HBV and HAV vaccines on or after 10/25/2012   ?? Alcoholism ([place]) 12/25/2011  ?? Allergic rhinitis   ?? Childhood asthma   ?? Elevated transaminase level 2009  ? AST: 80 ALT: 136 in 8/11: Hepatitis A., B and C negative.   ?? Morbid obesity ([place])   ?? 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 Follow[facility] at [facility] urology.   ?? Sleep apnea   ? ? ?No past surgical history on file. ? ?Family History  ?Problem Relation Age of Onset  ?? Liver disease Father   ?? Diabetes Father   ?? Hypertension Father   ?? Liver disease Sister   ?? Diabetes Other   ? ? ?Social History  ? ?Socioeconomic History  ?? Marital status: Single  ?  Spouse name: Not on file  ?? Number of children: Not on file  ?? Years of education: Not on file  ?? Highest education level: Not on file  ?Occupational History  ?? Not on file  ?Tobacco Use  ?? Smoking status: Some Days  ?  Types: Cigars  ?? Smokeless tobacco: Never  ?? Tobacco comments:  ?  Cigars occasional  ?Vaping Use  ?? Vaping  Use: Never used  ?Substance and Sexual Activity  ?? Alcohol use: Yes  ?  Comment: Beer/wine 1 - 3 times a week (drinks heavily on weekends)  ?? Drug use: Never  ?  Comment: history of cocaine abuse  ?? Sexual activity: Yes  ?Other Topics Concern  ?? Not on file  ?Social History Narrative  ? Scientist, research (medical).  Single. Gets regular exercise.   ?   ?   ?   ? ?Social Determinants of Health  ? ?Financial Resource Strain: Not on file  ?Food Insecurity: Not on file  ?Transportation Needs: Not on file  ?Physical Activity: Not on file  ?Stress: Not on file  ?Social Connections: Not on file  ?Intimate Partner Violence: Not on file  ? ? ?Outpatient Medications Prior to Visit  ?Medication Sig Dispense Refill  ?? b complex vitamins capsule Take 1 capsule by mouth daily.    ?? buPROPion (WELLBUTRIN XL) 150 MG 24 hr tablet Take 150 mg by mouth daily.    ?? halobetasol (ULTRAVATE) 0.05 % cream Apply to affected area daily after bathing 50 g 1  ?? Lactobacillus Reuteri (BIOGAIA PROBIOTIC PO) Take 5 drops by mouth daily at 8 pm.    ??  Multiple Vitamin (MULTIVITAMIN) capsule Take 1 capsule by mouth daily.    ?? omega-3 acid ethyl esters (LOVAZA) 1 g capsule Take by mouth 2 (two) times daily.    ?? pantoprazole (PROTONIX) 40 MG tablet Take 1 tablet (40 mg total) by mouth daily. 14 tablet 0  ?? triamcinolone ointment (KENALOG) 0.5 % Apply to rash on leg once daily. 30 g 0  ?? zinc gluconate 50 MG tablet Take 50 mg by mouth daily.    ? ?No facility-administered medications prior to visit.  ? ? ?Allergies  ?Allergen Reactions  ?? Codeine   ? ? ?ROS ?Review of Systems  ?Constitutional:  Negative for chills, diaphoresis, fatigue, fever and unexpected weight change.  ?HENT: Negative.    ?Eyes:  Negative for photophobia and visual disturbance.  ?Respiratory: Negative.    ?Cardiovascular: Negative.   ?Gastrointestinal:  Positive for abdominal pain and diarrhea. Negative for anal bleeding, blood in stool, nausea and vomiting.   ?Genitourinary: Negative.   ?Neurological: Negative.   ?Psychiatric/Behavioral: Negative.    ? ?  ?Objective:  ?  ?Physical Exam ? ?BP (!) 122/58 (BP Location: Right Arm, Patient Position: Sitting, Cuff Size: Large)   Pulse 92   Temp 98.4 ?F (36.9 ?C) (Temporal)   Ht '5\' 5"'$  (1.651 m)   Wt 279 lb 3.2 oz (126.6 kg)   SpO2 98%   BMI 46.46 kg/m?  ?Wt Readings from Last 3 Encounters:  ?08/26/21 279 lb 3.2 oz (126.6 kg)  ?08/12/21 279 lb 6.4 oz (126.7 kg)  ?08/01/21 285 lb (129.3 kg)  ? ? ? ?There are no preventive care reminders to display for this patient. ? ?There are no preventive care reminders to display for this patient. ? ?Lab Results  ?Component Value Date  ? TSH 2.85 02/07/2019  ? ?Lab Results  ?Component Value Date  ? WBC 10.5 08/12/2021  ? HGB 10.4 (L) 08/12/2021  ? HCT 30.6 (L) 08/12/2021  ? MCV 91.9 08/12/2021  ? PLT 201.0 08/12/2021  ? ?Lab Results  ?Component Value Date  ? NA 140 08/12/2021  ? K 4.0 08/12/2021  ? CO2 30 08/12/2021  ? GLUCOSE 91 08/12/2021  ? BUN 20 08/12/2021  ? CREATININE 0.70 08/12/2021  ? BILITOT 1.9 (H) 08/12/2021  ? ALKPHOS 155 (H) 08/12/2021  ? AST 63 (H) 08/12/2021  ? ALT 83 (H) 08/12/2021  ? PROT 6.4 08/12/2021  ? ALBUMIN 3.2 (L) 08/12/2021  ? CALCIUM 8.6 08/12/2021  ? ANIONGAP 8 08/01/2021  ? GFR 112.46 08/12/2021  ? ?Lab Results  ?Component Value Date  ? CHOL 162 06/14/2021  ? ?Lab Results  ?Component Value Date  ? HDL 56 06/14/2021  ? ?Lab Results  ?Component Value Date  ? [place] 89 06/14/2021  ? ?Lab Results  ?Component Value Date  ? TRIG 80 06/14/2021  ? ?Lab Results  ?Component Value Date  ? CHOLHDL 2.9 06/14/2021  ? ?Lab Results  ?Component Value Date  ? HGBA1C 4.8 10/12/2007  ? ? ?  ?Assessment & Plan:  ? ?Problem List Items Addressed This Visit   ? ?  ? Digestive  ? Gastroenteritis - Primary  ? Relevant Orders  ? GI Profile, Stool, PCR  ? Fecal lactoferrin, quant  ? Ova and parasite examination  ? Stool Culture  ? Amylase  ? Comprehensive metabolic panel  ?  ? Other   ? Normocytic anemia  ? Relevant Orders  ? B12 and Folate Panel  ? Iron, TIBC and Ferritin Panel  ? CBC  ? ? ?  No orders of the defined types were placed in this encounter. ? ? ?Follow-up: Return in about 1 month (around 09/26/2021), or if symptoms worsen or fail to improve.  ? ? ?Libby Maw, MD ?

## 2021-08-26 NOTE — Telephone Encounter (Signed)
Fax received from [place] stating that the patient's Thomas [name] is approved from 08/22/2021 through 08/23/2022. ?

## 2021-08-27 ENCOUNTER — Other Ambulatory Visit (INDEPENDENT_AMBULATORY_CARE_PROVIDER_SITE_OTHER): Payer: BC Managed Care – PPO

## 2021-08-27 DIAGNOSIS — K529 Noninfective gastroenteritis and colitis, unspecified: Secondary | ICD-10-CM | POA: Diagnosis not present

## 2021-08-27 LAB — B12 AND FOLATE PANEL
Folate: 13.4 ng/mL (ref >5.9)
Vitamin B-12: >1504 pg/mL — ABNORMAL HIGH (ref 211–911)

## 2021-08-27 LAB — COMPREHENSIVE METABOLIC PANEL
ALT: 64 U/L — ABNORMAL HIGH (ref 0–53)
AST: 63 U/L — ABNORMAL HIGH (ref 0–37)
Albumin: 3.2 g/dL — ABNORMAL LOW (ref 3.5–5.2)
Alkaline Phosphatase: 151 U/L — ABNORMAL HIGH (ref 39–117)
BUN: 7 mg/dL (ref 6–23)
CO2: 29 mEq/L (ref 19–32)
Calcium: 8.7 mg/dL (ref 8.4–10.5)
Chloride: 106 mEq/L (ref 96–112)
Creatinine, Ser: 0.64 mg/dL (ref 0.40–1.50)
GFR: 115.51 mL/min (ref >60.00)
Glucose, Bld: 95 mg/dL (ref 70–99)
Potassium: 3.9 mEq/L (ref 3.5–5.1)
Sodium: 142 mEq/L (ref 135–145)
Total Bilirubin: 1.6 mg/dL — ABNORMAL HIGH (ref 0.2–1.2)
Total Protein: 6.5 g/dL (ref 6.0–8.3)

## 2021-08-27 LAB — IRON,TIBC AND FERRITIN PANEL
%SAT: 23 % (calc) (ref 20–48)
Ferritin: 16 ng/mL — ABNORMAL LOW (ref 38–380)
Iron: 87 ug/dL (ref 50–180)
TIBC: 380 mcg/dL (calc) (ref 250–425)

## 2021-08-27 LAB — CBC
HCT: 32.5 % — ABNORMAL LOW (ref 39.0–52.0)
Hemoglobin: 10.6 g/dL — ABNORMAL LOW (ref 13.0–17.0)
MCHC: 32.7 g/dL (ref 30.0–36.0)
MCV: 88.3 fl (ref 78.0–100.0)
Platelets: 94 10*3/uL — ABNORMAL LOW (ref 150.0–400.0)
RBC: 3.68 Mil/uL — ABNORMAL LOW (ref 4.22–5.81)
RDW: 17.2 % — ABNORMAL HIGH (ref 11.5–15.5)
WBC: 3.7 10*3/uL — ABNORMAL LOW (ref 4.0–10.5)

## 2021-08-27 LAB — AMYLASE: Amylase: 60 U/L (ref 27–131)

## 2021-08-27 NOTE — Progress Notes (Signed)
Per the orders of Dr.Kremer pt is here to drop off stool samples, pt provided adequate amount of stool as requested.  ?

## 2021-08-29 ENCOUNTER — Encounter: Payer: Self-pay | Admitting: Family Medicine

## 2021-08-30 ENCOUNTER — Telehealth: Payer: Self-pay | Admitting: Family Medicine

## 2021-08-30 MED ORDER — AZITHROMYCIN 250 MG PO TABS: ORAL_TABLET | ORAL | 0 refills | Status: AC

## 2021-08-30 NOTE — Telephone Encounter (Signed)
Spoke with patient went over labs and medication. Patient agrees to keep follow up appointment  ?

## 2021-08-30 NOTE — Addendum Note (Signed)
Addended by: Jon Billings on: 08/30/2021 08:03 AM ? ? Modules accepted: Orders ? ?

## 2021-08-30 NOTE — Telephone Encounter (Signed)
Pt is wanting a call back concerning his most recent lab results. Please advise at [phone] ?

## 2021-08-30 NOTE — Telephone Encounter (Signed)
Returned patients call, no answer LMTCB 

## 2021-08-31 LAB — GI PROFILE, STOOL, PCR

## 2021-08-31 LAB — STOOL CULTURE: E coli, Shiga toxin Assay: NEGATIVE

## 2021-09-02 LAB — FECAL LACTOFERRIN, QUANT
Fecal Lactoferrin: NEGATIVE
MICRO NUMBER:: 13158871
SPECIMEN QUALITY:: ADEQUATE

## 2021-09-02 LAB — OVA AND PARASITE EXAMINATION
CONCENTRATE RESULT:: NONE SEEN
MICRO NUMBER:: 13158822
SPECIMEN QUALITY:: ADEQUATE
TRICHROME RESULT:: NONE SEEN

## 2021-09-05 ENCOUNTER — Encounter: Payer: Self-pay | Admitting: Gastroenterology

## 2021-09-05 ENCOUNTER — Ambulatory Visit (INDEPENDENT_AMBULATORY_CARE_PROVIDER_SITE_OTHER): Payer: BC Managed Care – PPO | Admitting: Gastroenterology

## 2021-09-05 ENCOUNTER — Other Ambulatory Visit (INDEPENDENT_AMBULATORY_CARE_PROVIDER_SITE_OTHER): Payer: BC Managed Care – PPO

## 2021-09-05 VITALS — BP 128/76 | HR 89 | Ht 65.0 in | Wt 271.6 lb

## 2021-09-05 DIAGNOSIS — K7030 Alcoholic cirrhosis of liver without ascites: Secondary | ICD-10-CM

## 2021-09-05 DIAGNOSIS — A049 Bacterial intestinal infection, unspecified: Secondary | ICD-10-CM

## 2021-09-05 DIAGNOSIS — K921 Melena: Secondary | ICD-10-CM | POA: Diagnosis not present

## 2021-09-05 DIAGNOSIS — K920 Hematemesis: Secondary | ICD-10-CM

## 2021-09-05 DIAGNOSIS — D62 Acute posthemorrhagic anemia: Secondary | ICD-10-CM

## 2021-09-05 LAB — PROTIME-INR
INR: 1.5 ratio — ABNORMAL HIGH (ref 0.8–1.0)
Prothrombin Time: 15.9 s — ABNORMAL HIGH (ref 9.6–13.1)

## 2021-09-05 LAB — GAMMA GT: GGT: 262 U/L — ABNORMAL HIGH (ref 7–51)

## 2021-09-05 MED ORDER — PLENVU 140 G PO SOLR: ORAL | 0 refills | Status: DC

## 2021-09-05 MED ORDER — PANTOPRAZOLE SODIUM 40 MG PO TBEC: 40.0000 mg | DELAYED_RELEASE_TABLET | Freq: Every day | ORAL | 3 refills | Status: DC

## 2021-09-05 NOTE — Progress Notes (Signed)
? ? ?HPI : Thomas [name] is a very pleasant 46 year old male with cirrhosis (presumably alcohol) who is referred to [place] by Dr. Abelino Derrick after he experienced an isolated episode of hematemesis and bloody diarrhea.  These symptoms started when he was visiting family in [place] last month.  He had been there for several days when he awoke one night with severe abdominal pain and nausea.  He vomited numerous times that night but the electricity was out and he couldn't really see his emesis.  He continued to have abdominal pain, nausea, vomiting and diarrhea the next 4 days and he noted that his emesis was black/coffee ground in appearance and his stool was dark red/maroon.  His symptoms improved significantly after about 4 days with any specific therapy (he did not seek medical attention while in [place]).   ?When he returned home, his vomiting resolved, but he continued to have abdominal pain, bloating/abdominal and loose stools (but not as profuse and frequent). ?He was seen in the ED on Feb 23rd when he got back to [facility], and was found to have a significant drop in his hgb (10 from baseline 15), but as the patient was not having recent bleeding, so he was not admitted.  He was prescribed Protonix and referred to GI. ?He saw his PCP at follow up who ordered a GI pathogen PCR test which was positive for E. Coli (enteroaggregative).  He was treated with a course of azithromycin which greatly improved his abdominal pain and distention.   ?Currently, he is having mostly formed stools, with occasional loose stools.  He denies abdominal pain, bloating, nausea/vomiting.  He has not seen any blood in his stool in over a month. ?He was diagnosed with cirrhosis in 2020 after an ultrasound showed a cirrhotic appearing liver.  He was advised to stop drinking at that point. ?He denies any history of jaundice or yellowing of the eyes/skin, swelling of the abdomen/legs, episodes of confusion/slowness of  thought/speech. ?He states that he cut back his alcohol in January of this year.  He has been having on average 2 drinks on the weekends.  He thinks his last drink was 3 weeks ago. ? ? ? ?Taking iron ? ? ?Past Medical History:  ?Diagnosis Date  ? Alcoholic fatty liver 1/85/6314  ? Needs final HBV and HAV vaccines on or after 10/25/2012   ? Alcoholism ([place]) 12/25/2011  ? Allergic rhinitis   ? Childhood asthma   ? Elevated transaminase level 2009  ? AST: 80 ALT: 136 in 8/11: Hepatitis A., B and C negative.   ? Morbid obesity ([place])   ? 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 Followed at [facility] urology.   ? Sleep apnea   ? ? ? ?No past surgical history on file. ?Family History  ?Problem Relation Age of Onset  ? Liver disease Father   ? Diabetes Father   ? Hypertension Father   ? Liver disease Sister   ? Diabetes Other   ? ?Social History  ? ?Tobacco Use  ? Smoking status: Some Days  ?  Types: Cigars  ? Smokeless tobacco: Never  ? Tobacco comments:  ?  Cigars occasional  ?Vaping Use  ? Vaping Use: Never used  ?Substance Use Topics  ? Alcohol use: Yes  ?  Comment: Beer/wine 1 - 3 times a week (drinks heavily on weekends)  ? Drug use: Never  ?  Comment: history of cocaine abuse  ? ?Current Outpatient Medications  ?Medication Sig Dispense  Refill  ? b complex vitamins capsule Take 1 capsule by mouth daily.    ? buPROPion (WELLBUTRIN XL) 150 MG 24 hr tablet Take 150 mg by mouth daily.    ? halobetasol (ULTRAVATE) 0.05 % cream Apply to affected area daily after bathing 50 g 1  ? Lactobacillus Reuteri (BIOGAIA PROBIOTIC PO) Take 5 drops by mouth daily at 8 pm.    ? Multiple Vitamin (MULTIVITAMIN) capsule Take 1 capsule by mouth daily.    ? omega-3 acid ethyl esters (LOVAZA) 1 g capsule Take by mouth 2 (two) times daily.    ? pantoprazole (PROTONIX) 40 MG tablet Take 1 tablet (40 mg total) by mouth daily. 14 tablet 0  ? triamcinolone ointment (KENALOG) 0.5 % Apply to rash on leg once daily. 30 g 0  ? zinc gluconate 50 MG tablet Take 50 mg by  mouth daily.    ? ?No current facility-administered medications for this visit.  ? ?Allergies  ?Allergen Reactions  ? Codeine   ? ? ? ?Review of Systems: ?All systems reviewed and negative except where noted in HPI.  ? ? ?No results found. ? ?Physical Exam: ?BP 128/76   Pulse 89   Ht '5\' 5"'$  (1.651 m)   Wt 271 lb 9.6 oz (123.2 kg)   SpO2 97%   BMI 45.20 kg/m?  ?Constitutional: Pleasant,well-developed, obese Hispanic male in no acute distress. ?HEENT: Normocephalic and atraumatic. Conjunctivae are normal. No scleral icterus. ?Neck supple.  ?Cardiovascular: Normal rate, regular rhythm.  ?Pulmonary/chest: Effort normal and breath sounds normal. No wheezing, rales or rhonchi. ?Abdominal: Soft, nondistended, nontender. Bowel sounds active throughout. There are no masses palpable. No hepatomegaly. ?Extremities: no edema ?Neurological: Alert and oriented to person place and time. ?Skin: Skin is warm and dry. No rashes noted. ?Psychiatric: Normal mood and affect. Behavior is normal.  No asterixis ? ?CBC ?   ?Component Value Date/Time  ? WBC 3.7 (L) 08/26/2021 1638  ? RBC 3.68 (L) 08/26/2021 1638  ? HGB 10.6 (L) 08/26/2021 1638  ? HCT 32.5 (L) 08/26/2021 1638  ? PLT 94.0 (L) 08/26/2021 1638  ? MCV 88.3 08/26/2021 1638  ? MCH 33.2 08/01/2021 2010  ? MCHC 32.7 08/26/2021 1638  ? RDW 17.2 (H) 08/26/2021 1638  ? LYMPHSABS 2.0 08/01/2021 2010  ? MONOABS 1.1 (H) 08/01/2021 2010  ? EOSABS 0.0 08/01/2021 2010  ? BASOSABS 0.0 08/01/2021 2010  ? ? ?CMP  ?   ?Component Value Date/Time  ? NA 142 08/26/2021 1638  ? K 3.9 08/26/2021 1638  ? CL 106 08/26/2021 1638  ? CO2 29 08/26/2021 1638  ? GLUCOSE 95 08/26/2021 1638  ? BUN 7 08/26/2021 1638  ? CREATININE 0.64 08/26/2021 1638  ? CREATININE 0.66 06/14/2021 1419  ? CALCIUM 8.7 08/26/2021 1638  ? PROT 6.5 08/26/2021 1638  ? ALBUMIN 3.2 (L) 08/26/2021 1638  ? AST 63 (H) 08/26/2021 1638  ? ALT 64 (H) 08/26/2021 1638  ? ALKPHOS 151 (H) 08/26/2021 1638  ? BILITOT 1.6 (H) 08/26/2021 1638  ?  GFRNONAA >60 08/01/2021 2010  ? GFRNONAA >89 06/22/2013 0958  ? GFRAA >89 06/22/2013 0958  ? ?Component Ref Range & Units 10 d ago 3 wk ago 1 mo ago 2 mo ago 1 yr ago 2 yr ago 8 yr ago  ?WBC 4.0 - 10.5 K/uL 3.7 Low   10.5  12.0 High   6.2 R  5.2  4.8  6.9   ?RBC 4.22 - 5.81 Mil/uL 3.68 Low   3.33 Low  3.01 Low  R  4.71 R  4.60  4.81  5.37 R   ?Platelets 150.0 - 400.0 K/uL 94.0 Low   201.0  178 R  134 Low  R  125.0 Low   123.0 Low   258 R   ?Hemoglobin 13.0 - 17.0 g/dL 10.6 Low   10.4 Low   10.0 Low   15.2 R  14.5  15.0  16.5   ?HCT 39.0 - 52.0 % 32.5 Low   30.6 Low   28.8 Low   43.3 R  42.0  43.3  46.4   ?MCV 78.0 - 100.0 fl 88.3  91.9  95.7 R  91.9 R  91.3  90.0  86.4 R   ?MCHC 30.0 - 36.0 g/dL 32.7  33.8  34.7  35.1 R  34.5  34.6  35.6   ?RDW 11.5 - 15.5 % 17.2 High   16.7 High   16.8 High   14.7 R  15.0  14.1  14.0   ? ?Component Ref Range & Units 9 d ago  ?Campylobacter Not Detected Not Detected   ?C difficile toxin A/B Not Detected Not Detected   ?Plesiomonas shigelloides Not Detected Not Detected   ?Salmonella Not Detected Not Detected   ?Vibrio Not Detected Not Detected   ?Vibrio cholerae Not Detected Not Detected   ?Yersinia enterocolitica Not Detected Not Detected   ?Enteroaggregative E coli Not Detected Detected Abnormal    ?Enteropathogenic E coli Not Detected Not Detected   ?Enterotoxigenic E coli Not Detected Not Detected   ?Shiga-toxin-producing E coli Not Detected Not Detected   ?E coli O157 Not Detected Not applicable   ?Shigella/Enteroinvasive E coli Not Detected Not Detected   ?Cryptosporidium Not Detected Not Detected   ?Cyclospora cayetanensis Not Detected Not Detected   ?Entamoeba histolytica Not Detected Not Detected   ?Giardia lamblia Not Detected Not Detected   ?Adenovirus F 40/41 Not Detected Not Detected   ?Astrovirus Not Detected Not Detected   ?Norovirus GI/GII Not Detected Not Detected   ?Rotavirus A Not Detected Not Detected   ?Sapovirus Not Detected Not Detected   ?Resulting Agency   [facility]  ? ?Component 9 d ago  ?MICRO NUMBER: [id number]   ?SPECIMEN QUALITY: Adequate   ?Source STOOL   ?STATUS: FINAL   ?Fecal Lactoferrin Negative   ?COMMENT: Lactoferrin in the stool is a marker for fecal leukocy

## 2021-09-05 NOTE — Patient Instructions (Signed)
If you are age 44 or older, your body mass index should be between 23-30. Your Body mass index is 45.2 kg/m?Marland Kitchen If this is out of the aforementioned range listed, please consider follow up with your Primary Care Provider. ? ?If you are age 66 or younger, your body mass index should be between 19-25. Your Body mass index is 45.2 kg/m?Marland Kitchen If this is out of the aformentioned range listed, please consider follow up with your Primary Care Provider.  ? ?You have been scheduled for an endoscopy and colonoscopy. Please follow the written instructions given to you at your visit today. ?Please pick up your prep supplies at the pharmacy within the next 1-3 days. ?If you use inhalers (even only as needed), please bring them with you on the day of your procedure.  ? ?Your provider has requested that you go to the basement level for lab work before leaving today. Press "B" on the elevator. The lab is located at the first door on the left as you exit the elevator. ? ?You have been scheduled for an abdominal ultrasound at [facility] (1st floor of hospital) on 09/12/21 at 11am. Please arrive 15 minutes prior to your appointment for registration. Make certain not to have anything to eat or drink 6 hours prior to your appointment. Should you need to reschedule your appointment, please contact radiology at [phone]. This test typically takes about 30 minutes to perform. ? ?The [facility] providers would like to encourage you to use [facility] to communicate with providers for non-urgent requests or questions.  Due to long hold times on the telephone, sending your provider a message by [facility] may be a faster and more efficient way to get a response.  Please allow 48 business hours for a response.  Please remember that this is for non-urgent requests.  ? ?Due to recent changes in healthcare laws, you may see the results of your imaging and laboratory studies on MyChart before your provider has had a chance to review them.  We  understand that in some cases there may be results that are confusing or concerning to you. Not all laboratory results come back in the same time frame and the provider may be waiting for multiple results in order to interpret others.  Please give [place] 48 hours in order for your provider to thoroughly review all the results before contacting the office for clarification of your results.  ? ?It was a pleasure to see you today! ? ?Thank you for trusting me with your gastrointestinal care!   ? ?Scott E.Candis Schatz, MD  ? ?

## 2021-09-12 ENCOUNTER — Ambulatory Visit (HOSPITAL_COMMUNITY)
Admission: RE | Admit: 2021-09-12 | Discharge: 2021-09-12 | Disposition: A | Discharge status: Home / Self Care | Payer: BC Managed Care – PPO | Source: Ambulatory Visit | Attending: Gastroenterology | Admitting: Gastroenterology

## 2021-09-12 DIAGNOSIS — R1110 Vomiting, unspecified: Secondary | ICD-10-CM | POA: Diagnosis not present

## 2021-09-12 DIAGNOSIS — K921 Melena: Secondary | ICD-10-CM | POA: Diagnosis not present

## 2021-09-12 DIAGNOSIS — K920 Hematemesis: Secondary | ICD-10-CM | POA: Insufficient documentation

## 2021-09-12 LAB — ANTI-SMOOTH MUSCLE ANTIBODY, IGG: Actin (Smooth Muscle) Antibody (IGG): 28 U — ABNORMAL HIGH (ref <20)

## 2021-09-12 LAB — IGG: IgG (Immunoglobin G), Serum: 2050 mg/dL — ABNORMAL HIGH (ref 600–1640)

## 2021-09-12 LAB — MITOCHONDRIAL ANTIBODIES: Mitochondrial M2 Ab, IgG: <=20 U (ref <=20.0)

## 2021-09-12 LAB — CERULOPLASMIN: Ceruloplasmin: 25 mg/dL (ref 18–36)

## 2021-09-12 LAB — ALPHA-1-ANTITRYPSIN: A-1 Antitrypsin, Ser: 162 mg/dL (ref 83–199)

## 2021-09-17 ENCOUNTER — Encounter: Payer: Self-pay | Admitting: Gastroenterology

## 2021-09-17 NOTE — Progress Notes (Signed)
Thomas [name],  ?The results from your lab work up support alcohol as the cause of your cirrhosis. Although your anti-smooth muscle antibody was positive, it was weakly positive and unlikely to represent autoimmune hepatitis.  A liver biopsy may help further exclude autoimmune hepatitis as a cause, but I do not believe this is necessary.  We can discuss this further though at the time of your endoscopy.

## 2021-09-17 NOTE — Progress Notes (Signed)
Thomas [name],  ?Your ultrasound showed a cirrhotic appearing liver, but no ascites (fluid), concerning masses or lesions and no blood clots.  Your gallbladder was contracted and not well visualized.  If you were having symptoms of right upper abdominal pain, there would be concern about possible gallbladder inflammation, but since you are currently asymptomatic, I think the gallbladder thickness is merely secondary to it being in a contracted state at the time of the exam.

## 2021-09-18 ENCOUNTER — Other Ambulatory Visit: Payer: Self-pay

## 2021-09-18 DIAGNOSIS — R109 Unspecified abdominal pain: Secondary | ICD-10-CM

## 2021-09-19 ENCOUNTER — Encounter: Payer: Self-pay | Admitting: Family Medicine

## 2021-09-20 ENCOUNTER — Ambulatory Visit: Payer: BC Managed Care – PPO | Admitting: Family Medicine

## 2021-09-27 ENCOUNTER — Ambulatory Visit: Payer: BC Managed Care – PPO | Admitting: Family Medicine

## 2021-09-30 ENCOUNTER — Encounter: Payer: Self-pay | Admitting: Family Medicine

## 2021-09-30 ENCOUNTER — Other Ambulatory Visit: Payer: Self-pay | Admitting: Gastroenterology

## 2021-10-02 ENCOUNTER — Encounter (HOSPITAL_COMMUNITY)
Admission: RE | Admit: 2021-10-02 | Discharge: 2021-10-02 | Disposition: A | Discharge status: Home / Self Care | Payer: BC Managed Care – PPO | Source: Ambulatory Visit | Attending: Gastroenterology | Admitting: Gastroenterology

## 2021-10-02 DIAGNOSIS — R109 Unspecified abdominal pain: Secondary | ICD-10-CM | POA: Insufficient documentation

## 2021-10-02 MED ORDER — TECHNETIUM TC 99M MEBROFENIN IV KIT
5.5000 | PACK | Freq: Once | INTRAVENOUS | Status: AC
  Administered 2021-10-02: 5.5 via INTRAVENOUS

## 2021-10-03 ENCOUNTER — Ambulatory Visit (INDEPENDENT_AMBULATORY_CARE_PROVIDER_SITE_OTHER): Payer: BC Managed Care – PPO | Admitting: Family Medicine

## 2021-10-03 ENCOUNTER — Encounter: Payer: Self-pay | Admitting: Family Medicine

## 2021-10-03 VITALS — BP 138/82 | HR 102 | Temp 97.5°F | Ht 65.0 in | Wt 274.8 lb

## 2021-10-03 DIAGNOSIS — J069 Acute upper respiratory infection, unspecified: Secondary | ICD-10-CM | POA: Diagnosis not present

## 2021-10-03 DIAGNOSIS — L4050 Arthropathic psoriasis, unspecified: Secondary | ICD-10-CM | POA: Diagnosis not present

## 2021-10-03 MED ORDER — METHYLPREDNISOLONE SODIUM SUCC 125 MG IJ SOLR
60.0000 mg | Freq: Once | INTRAMUSCULAR | Status: AC
  Administered 2021-10-03: 60 mg via INTRAMUSCULAR

## 2021-10-03 MED ORDER — PREDNISONE 10 MG (48) PO TBPK: ORAL_TABLET | ORAL | 0 refills | Status: DC

## 2021-10-03 NOTE — Progress Notes (Signed)
? ?Established Patient Office Visit ? ?Subjective   ?Patient ID: Thomas [name], male    DOB: Jan 16, 1976  Age: 46 y.o. MRN: [id number] ? ?Chief Complaint  ?Patient presents with  ? Follow-up  ?  C/o having joint pain from having RA.  Has taken Ibuprofen and Tylenol with no relief.    ? ? ?HPI evaluation of a 4-day history of nasal congestion postnasal drip and sinus congestion. Little sneeze. Feels okay. There is no cough wheezing or difficulty breathing.  Ongoing migratory arthritic pains involving his wrist fingers knees and hips.  Joint pain is asymmetrical.  It comes and goes every single day.  He gave himself his first Skyrizi injection a week ago.  Patient is taken prednisone in the past and has no issues ? ? ? ?Review of Systems  ?Constitutional: Negative.  Negative for chills and fever.  ?HENT:  Positive for congestion and sinus pain.   ?Eyes:  Negative for blurred vision, discharge and redness.  ?Respiratory: Negative.  Negative for cough.   ?Cardiovascular: Negative.   ?Gastrointestinal:  Negative for abdominal pain.  ?Genitourinary: Negative.   ?Musculoskeletal:  Positive for joint pain. Negative for myalgias.  ?Skin:  Negative for rash.  ?Neurological:  Negative for tingling, loss of consciousness and weakness.  ?Endo/Heme/Allergies:  Negative for polydipsia.  ? ?  ?Objective:  ?  ? ?BP 138/82   Pulse (!) 102   Temp (!) 97.5 ?F (36.4 ?C) (Temporal)   Ht '5\' 5"'$  (1.651 m)   Wt 274 lb 12.8 oz (124.6 kg)   SpO2 98%   BMI 45.73 kg/m?  ? ? ?Physical Exam ?Vitals and nursing note reviewed.  ?Constitutional:   ?   General: He is not in acute distress. ?   Appearance: Normal appearance. He is not ill-appearing, toxic-appearing or diaphoretic.  ?HENT:  ?   Head: Normocephalic and atraumatic.  ?   Right Ear: External ear normal.  ?   Left Ear: External ear normal.  ?   Mouth/Throat:  ?   Pharynx: Oropharynx is clear. No oropharyngeal exudate or posterior oropharyngeal erythema.  ?Eyes:  ?   General: No scleral  icterus.    ?   Right eye: No discharge.     ?   Left eye: No discharge.  ?   Extraocular Movements: Extraocular movements intact.  ?   Conjunctiva/sclera: Conjunctivae normal.  ?   Pupils: Pupils are equal, round, and reactive to light.  ?Cardiovascular:  ?   Rate and Rhythm: Normal rate and regular rhythm.  ?Pulmonary:  ?   Effort: Pulmonary effort is normal.  ?   Breath sounds: Normal breath sounds.  ?Musculoskeletal:     ?   General: No swelling or deformity.  ?   Cervical back: No rigidity or tenderness.  ?Lymphadenopathy:  ?   Cervical: No cervical adenopathy.  ?Skin: ?   General: Skin is warm and dry.  ?Neurological:  ?   Mental Status: He is alert and oriented to person, place, and time.  ?Psychiatric:     ?   Mood and Affect: Mood normal.     ?   Behavior: Behavior normal.  ? ? ? ?No results found for any visits on 10/03/21. ? ? ? ?The 10-year ASCVD risk score (Arnett DK, et al., 2019) is: 3.5% ? ?  ?Assessment & Plan:  ? ?Problem List Items Addressed This Visit   ? ?  ? Respiratory  ? Viral upper respiratory tract infection - Primary  ?  Relevant Orders  ? [place] COVID-19  ?  ? Musculoskeletal and Integument  ? Psoriatic arthritis ([place])  ? Relevant Medications  ? predniSONE (STERAPRED UNI-PAK 48 TAB) 10 MG (48) TBPK tablet  ? Other Relevant Orders  ? Ambulatory referral to Rheumatology  ? ? ?Return in about 1 week (around 10/10/2021), or if symptoms worsen or fail to improve.  ?Return in 1 week if respiratory symptoms do not improve.  12-day Dosepak. ? ?Libby Maw, MD ? ?

## 2021-10-03 NOTE — Progress Notes (Signed)
[name],  ?Your HIDA scan was negative, indicating normal gallbladder function and flow of bile. ?Will see you in 2 weeks for your endoscopies.

## 2021-10-07 ENCOUNTER — Encounter: Payer: Self-pay | Admitting: Family Medicine

## 2021-10-08 ENCOUNTER — Encounter: Payer: Self-pay | Admitting: Gastroenterology

## 2021-10-11 DIAGNOSIS — K7460 Unspecified cirrhosis of liver: Secondary | ICD-10-CM | POA: Diagnosis not present

## 2021-10-11 DIAGNOSIS — M79671 Pain in right foot: Secondary | ICD-10-CM | POA: Diagnosis not present

## 2021-10-11 DIAGNOSIS — M79641 Pain in right hand: Secondary | ICD-10-CM | POA: Diagnosis not present

## 2021-10-11 DIAGNOSIS — M25561 Pain in right knee: Secondary | ICD-10-CM | POA: Diagnosis not present

## 2021-10-11 DIAGNOSIS — M79672 Pain in left foot: Secondary | ICD-10-CM | POA: Diagnosis not present

## 2021-10-11 DIAGNOSIS — L4050 Arthropathic psoriasis, unspecified: Secondary | ICD-10-CM | POA: Diagnosis not present

## 2021-10-11 DIAGNOSIS — L409 Psoriasis, unspecified: Secondary | ICD-10-CM | POA: Diagnosis not present

## 2021-10-11 DIAGNOSIS — M1990 Unspecified osteoarthritis, unspecified site: Secondary | ICD-10-CM | POA: Diagnosis not present

## 2021-10-11 DIAGNOSIS — M79642 Pain in left hand: Secondary | ICD-10-CM | POA: Diagnosis not present

## 2021-10-11 DIAGNOSIS — M25562 Pain in left knee: Secondary | ICD-10-CM | POA: Diagnosis not present

## 2021-10-15 ENCOUNTER — Encounter: Payer: Self-pay | Admitting: Gastroenterology

## 2021-10-15 ENCOUNTER — Ambulatory Visit (AMBULATORY_SURGERY_CENTER): Payer: BC Managed Care – PPO | Admitting: Gastroenterology

## 2021-10-15 VITALS — BP 136/85 | HR 82 | Temp 99.6°F | Resp 15 | Ht 65.0 in | Wt 271.0 lb

## 2021-10-15 DIAGNOSIS — K259 Gastric ulcer, unspecified as acute or chronic, without hemorrhage or perforation: Secondary | ICD-10-CM | POA: Diagnosis not present

## 2021-10-15 DIAGNOSIS — K7030 Alcoholic cirrhosis of liver without ascites: Secondary | ICD-10-CM | POA: Diagnosis not present

## 2021-10-15 DIAGNOSIS — I8510 Secondary esophageal varices without bleeding: Secondary | ICD-10-CM | POA: Diagnosis not present

## 2021-10-15 DIAGNOSIS — K766 Portal hypertension: Secondary | ICD-10-CM

## 2021-10-15 DIAGNOSIS — K920 Hematemesis: Secondary | ICD-10-CM | POA: Diagnosis not present

## 2021-10-15 DIAGNOSIS — K317 Polyp of stomach and duodenum: Secondary | ICD-10-CM | POA: Diagnosis not present

## 2021-10-15 DIAGNOSIS — D509 Iron deficiency anemia, unspecified: Secondary | ICD-10-CM

## 2021-10-15 DIAGNOSIS — D120 Benign neoplasm of cecum: Secondary | ICD-10-CM

## 2021-10-15 DIAGNOSIS — D123 Benign neoplasm of transverse colon: Secondary | ICD-10-CM | POA: Diagnosis not present

## 2021-10-15 MED ORDER — PROPRANOLOL HCL 20 MG PO TABS: 20.0000 mg | ORAL_TABLET | Freq: Two times a day (BID) | ORAL | 3 refills | Status: DC

## 2021-10-15 MED ORDER — SODIUM CHLORIDE 0.9 % IV SOLN: 500.0000 mL | Freq: Once | INTRAVENOUS | Status: DC

## 2021-10-15 NOTE — Op Note (Signed)
[place] ?Patient Name: Thomas [name] ?Procedure Date: 10/15/2021 8:36 AM ?MRN: [id number] ?Endoscopist:  E. Candis Schatz , MD ?Age: 46 ?Referring MD:  ?Date of Birth: [birth year] ?Gender: Male ?Account #: [id number] ?Procedure:                Upper GI endoscopy ?Indications:              Iron deficiency anemia, Hematemesis, Cirrhosis rule  ?                          out esophageal varices ?Medicines:                Monitored Anesthesia Care ?Procedure:                Pre-Anesthesia Assessment: ?                          - Prior to the procedure, a History and Physical  ?                          was performed, and patient medications and  ?                          allergies were reviewed. The patient's tolerance of  ?                          previous anesthesia was also reviewed. The risks  ?                          and benefits of the procedure and the sedation  ?                          options and risks were discussed with the patient.  ?                          All questions were answered, and informed consent  ?                          was obtained. Prior Anticoagulants: The patient has  ?                          taken no previous anticoagulant or antiplatelet  ?                          agents. ASA Grade Assessment: III - A patient with  ?                          severe systemic disease. After reviewing the risks  ?                          and benefits, the patient was deemed in  ?                          satisfactory condition to undergo the procedure. ?  After obtaining informed consent, the endoscope was  ?                          passed under direct vision. Throughout the  ?                          procedure, the patient's blood pressure, pulse, and  ?                          oxygen saturations were monitored continuously. The  ?                          GIF HQ190 #[id number] was introduced through the  ?                          mouth, and advanced to the  second part of duodenum.  ?                          The upper GI endoscopy was accomplished without  ?                          difficulty. The patient tolerated the procedure  ?                          well. ?Scope In: ?Scope Out: ?Findings:                 The examined portions of the nasopharynx,  ?                          oropharynx and larynx were normal. ?                          Grade II varices were found in the lower third of  ?                          the esophagus. ?                          The exam of the esophagus was otherwise normal. ?                          Moderate portal hypertensive gastropathy was found  ?                          in the gastric body and fundus. Biopsies were taken  ?                          with a cold forceps for histology. Estimated blood  ?                          loss was minimal. ?                          Multiple 2 to 8 mm friable sessile polyps with no  ?  stigmata of recent bleeding were found in the  ?                          prepyloric region of the stomach. Biopsies were  ?                          taken with a cold forceps for histology. Estimated  ?                          blood loss was minimal. ?                          The exam of the stomach was otherwise normal. ?                          The examined duodenum was normal. ?Complications:            No immediate complications. ?Estimated Blood Loss:     Estimated blood loss was minimal. ?Impression:               - The examined portions of the nasopharynx,  ?                          oropharynx and larynx were normal. ?                          - Grade II esophageal varices. ?                          - Portal hypertensive gastropathy. Biopsied. ?                          - Multiple gastric polyps. Biopsied. Suspect these  ?                          are inflammatory ?                          - Normal examined duodenum. ?                          - Unclear if patient's  hematemesis in the setting  ?                          of his E. Coli infection was secondary to variceal  ?                          bleeding vs. portal hypertensive gastropathy ?Recommendation:           - Patient has a contact number available for  ?                          emergencies. The signs and symptoms of potential  ?                          delayed complications were discussed with the  ?  patient. Return to normal activities tomorrow.  ?                          Written discharge instructions were provided to the  ?                          patient. ?                          - Resume previous diet. ?                          - Continue present medications. ?                          - Await pathology results. ?                          - Recommend starting propranolol 20 mg PO BID,  ?                          titrating dose to resting HR 55-60. ?                          - Will discuss variceal banding ?                          - Continue complete alcohol abstinence indefinitely ? E. Candis Schatz, MD ?10/15/2021 9:28:09 AM ?This report has been signed electronically. ?

## 2021-10-15 NOTE — Progress Notes (Signed)
Pt non-responsive, VVS, Report to RN  °

## 2021-10-15 NOTE — Progress Notes (Signed)
Called to room to assist during endoscopic procedure.  Patient ID and intended procedure confirmed with present staff. Received instructions for my participation in the procedure from the performing physician.  

## 2021-10-15 NOTE — Progress Notes (Signed)
[facility] History and Physical ? ? ?Primary Care Physician:  Libby Maw, MD ? ? ?Reason for Procedure:   Anemia, hematemesis, cirrhosis (variceal screening) ? ?Plan:    EGD/Colonoscopy ? ? ? ? ?HPI: Thomas [name] is a 46 y.o. male with alcoholic cirrhosis with recent acute GI illness (E. Coli) with hematemesis and hematochezia, also with new anemia (hgb 10, ferritin 16).  Currently he is not having any bloody stools, no abdominal pain and he is feeling much better. ? ? ?Past Medical History:  ?Diagnosis Date  ? Alcoholic fatty liver 9/67/5916  ? Needs final HBV and HAV vaccines on or after 10/25/2012   ? Alcoholism ([place]) 12/25/2011  ? Allergic rhinitis   ? Childhood asthma   ? Elevated transaminase level 2009  ? AST: 80 ALT: 136 in 8/11: Hepatitis A., B and C negative.   ? Morbid obesity ([place])   ? 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 Followed at [facility].   ? Sleep apnea   ? ? ?History reviewed. No pertinent surgical history. ? ?Prior to Admission medications   ?Medication Sig Start Date End Date Taking? Authorizing Provider  ?buPROPion (WELLBUTRIN XL) 150 MG 24 hr tablet Take 150 mg by mouth daily.   Yes [provider]  ?ferrous sulfate 325 (65 FE) MG EC tablet Take 325 mg by mouth 3 (three) times daily with meals.   Yes [provider]  ?Multiple Vitamin (MULTIVITAMIN) capsule Take 1 capsule by mouth daily.   Yes [provider]  ?pantoprazole (PROTONIX) 40 MG tablet TAKE 1 TABLET BY MOUTH EVERY DAY 09/30/21  Yes Daryel November, MD  ?predniSONE (STERAPRED UNI-PAK 48 TAB) 10 MG (48) TBPK tablet Please instruct 12 day dose pack to start tomorrow. 10/03/21  Yes Libby Maw, MD  ?SKYRIZI PEN 150 MG/ML SOAJ Inject into the skin. 08/28/21   [provider]  ? ? ?Current Outpatient Medications  ?Medication Sig Dispense Refill  ? buPROPion (WELLBUTRIN XL) 150 MG 24 hr tablet Take 150 mg by mouth daily.    ? ferrous sulfate 325 (65 FE) MG EC tablet  Take 325 mg by mouth 3 (three) times daily with meals.    ? Multiple Vitamin (MULTIVITAMIN) capsule Take 1 capsule by mouth daily.    ? pantoprazole (PROTONIX) 40 MG tablet TAKE 1 TABLET BY MOUTH EVERY DAY 90 tablet 3  ? predniSONE (STERAPRED UNI-PAK 48 TAB) 10 MG (48) TBPK tablet Please instruct 12 day dose pack to start tomorrow. 48 tablet 0  ? SKYRIZI PEN 150 MG/ML SOAJ Inject into the skin.    ? ?Current Facility-Administered Medications  ?Medication Dose Route Frequency Provider Last Rate Last Admin  ? 0.9 %  sodium chloride infusion  500 mL Intravenous Once Daryel November, MD      ? ? ?Allergies as of 10/15/2021 - Review Complete 10/15/2021  ?Allergen Reaction Noted  ? Codeine  02/13/2015  ? ? ?Family History  ?Problem Relation Age of Onset  ? Liver disease Mother   ? Depression Mother   ? Liver disease Father   ? Diabetes Father   ? Hypertension Father   ? Liver disease Sister   ? Diabetes Other   ? Colon cancer Neg Hx   ? Esophageal cancer Neg Hx   ? Stomach cancer Neg Hx   ? Colon polyps Neg Hx   ? ? ?Social History  ? ?Socioeconomic History  ? Marital status: Single  ?  Spouse name: Not on file  ?  Number of children: Not on file  ? Years of education: Not on file  ? Highest education level: Not on file  ?Occupational History  ? Not on file  ?Tobacco Use  ? Smoking status: Some Days  ?  Types: Cigars  ? Smokeless tobacco: Never  ? Tobacco comments:  ?  Cigars occasional  ?Vaping Use  ? Vaping Use: Never used  ?Substance and Sexual Activity  ? Alcohol use: Yes  ?  Comment: Beer/wine 1 - 3 times a week (drinks heavily on weekends)  ? Drug use: Never  ?  Comment: history of cocaine abuse  ? Sexual activity: Yes  ?Other Topics Concern  ? Not on file  ?Social History Narrative  ? Scientist, research (medical).  Single. Gets regular exercise.   ?   ?   ?   ? ?Social Determinants of Health  ? ?Financial Resource Strain: Not on file  ?Food Insecurity: Not on file  ?Transportation Needs: Not on file  ?Physical  Activity: Not on file  ?Stress: Not on file  ?Social Connections: Not on file  ?Intimate Partner Violence: Not on file  ? ? ?Review of Systems: ? ?All other review of systems negative except as mentioned in the HPI. ? ?Physical Exam: ?Vital signs ?BP 117/65   Pulse 75   Temp 99.6 ?F (37.6 ?C) (Temporal)   Ht '5\' 5"'$  (1.651 m)   Wt 271 lb (122.9 kg)   SpO2 97%   BMI 45.10 kg/m?  ? ?General:   Alert,  Well-developed, well-nourished, pleasant and cooperative in NAD ?Airway:  Mallampati 3 ?Lungs:  Clear throughout to auscultation.   ?Heart:  Regular rate and rhythm; no murmurs, clicks, rubs,  or gallops. ?Abdomen:  Soft, nontender and nondistended. Normal bowel sounds.   ?Neuro/Psych:  Normal mood and affect. A and O x 3 ? ? ? E. Candis Schatz, MD ?[facility] ? ?

## 2021-10-15 NOTE — Patient Instructions (Addendum)
Handout provided on polyps. ? ?Continue complete alcohol abstinence indefinitely.  ? ?Start Propranolol '20mg'$  by mouth twice daily. Goal is a resting heart rate of 55-60 beats per minute. If you can monitor your resting heart rate and keep a diary of it to bring to your follow up appointment.  ? ?YOU HAD AN ENDOSCOPIC PROCEDURE TODAY AT [facility]:   Refer to the procedure report that was given to you for any specific questions about what was found during the examination.  If the procedure report does not answer your questions, please call your gastroenterologist to clarify.  If you requested that your care partner not be given the details of your procedure findings, then the procedure report has been included in a sealed envelope for you to review at your convenience later. ? ?YOU SHOULD EXPECT: Some feelings of bloating in the abdomen. Passage of more gas than usual.  Walking can help get rid of the air that was put into your GI tract during the procedure and reduce the bloating. If you had a lower endoscopy (such as a colonoscopy or flexible sigmoidoscopy) you may notice spotting of blood in your stool or on the toilet paper. If you underwent a bowel prep for your procedure, you may not have a normal bowel movement for a few days. ? ?Please Note:  You might notice some irritation and congestion in your nose or some drainage.  This is from the oxygen used during your procedure.  There is no need for concern and it should clear up in a day or so. ? ?SYMPTOMS TO REPORT IMMEDIATELY: ? ?Following lower endoscopy (colonoscopy or flexible sigmoidoscopy): ? Excessive amounts of blood in the stool ? Significant tenderness or worsening of abdominal pains ? Swelling of the abdomen that is new, acute ? Fever of 100?F or higher ? ?Following upper endoscopy (EGD) ? Vomiting of blood or coffee ground material ? New chest pain or pain under the shoulder blades ? Painful or persistently difficult swallowing ? New  shortness of breath ? Fever of 100?F or higher ? Black, tarry-looking stools ? ?For urgent or emergent issues, a gastroenterologist can be reached at any hour by calling [phone]. ?Do not use MyChart messaging for urgent concerns.  ? ? ?DIET:  We do recommend a small meal at first, but then you may proceed to your regular diet.  Drink plenty of fluids but you should avoid alcoholic beverages for 24 hours. ? ?ACTIVITY:  You should plan to take it easy for the rest of today and you should NOT DRIVE or use heavy machinery until tomorrow (because of the sedation medicines used during the test).   ? ?FOLLOW UP: ?Our staff will call the number listed on your records 48-72 hours following your procedure to check on you and address any questions or concerns that you may have regarding the information given to you following your procedure. If we do not reach you, we will leave a message.  We will attempt to reach you two times.  During this call, we will ask if you have developed any symptoms of COVID 19. If you develop any symptoms (ie: fever, flu-like symptoms, shortness of breath, cough etc.) before then, please call [phone].  If you test positive for Covid 19 in the 2 weeks post procedure, please call and report this information to [place].   ? ?If any biopsies were taken you will be contacted by phone or by letter within the next 1-3 weeks.  Please call us at [phone] if you have not heard about the biopsies in 3 weeks.  ? ? ?SIGNATURES/CONFIDENTIALITY: ?You and/or your care partner have signed paperwork which will be entered into your electronic medical record.  These signatures attest to the fact that that the information above on your After Visit Summary has been reviewed and is understood.  Full responsibility of the confidentiality of this discharge information lies with you and/or your care-partner. ? ?

## 2021-10-15 NOTE — Op Note (Signed)
[place] ?Patient Name: Thomas [name] ?Procedure Date: 10/15/2021 8:36 AM ?MRN: [id number] ?Endoscopist:  E. Candis Schatz , MD ?Age: 46 ?Referring MD:  ?Date of Birth: 12-30-1975 ?Gender: Male ?Account #: [id number] ?Procedure:                Colonoscopy ?Indications:              Iron deficiency anemia ?Medicines:                Monitored Anesthesia Care ?Procedure:                Pre-Anesthesia Assessment: ?                          - Prior to the procedure, a History and Physical  ?                          was performed, and patient medications and  ?                          allergies were reviewed. The patient's tolerance of  ?                          previous anesthesia was also reviewed. The risks  ?                          and benefits of the procedure and the sedation  ?                          options and risks were discussed with the patient.  ?                          All questions were answered, and informed consent  ?                          was obtained. Prior Anticoagulants: The patient has  ?                          taken no previous anticoagulant or antiplatelet  ?                          agents. ASA Grade Assessment: III - A patient with  ?                          severe systemic disease. After reviewing the risks  ?                          and benefits, the patient was deemed in  ?                          satisfactory condition to undergo the procedure. ?                          After obtaining informed consent, the colonoscope  ?  was passed under direct vision. Throughout the  ?                          procedure, the patient's blood pressure, pulse, and  ?                          oxygen saturations were monitored continuously. The  ?                          Olympus CF-[id number] (#[id number]) Colonoscope was  ?                          introduced through the anus and advanced to the the  ?                          terminal ileum, with identification  of the  ?                          appendiceal orifice and IC valve. The colonoscopy  ?                          was performed without difficulty. The patient  ?                          tolerated the procedure well. The quality of the  ?                          bowel preparation was good except the cecum was  ?                          unsatisfactory (unable to visualize the appendiceal  ?                          orifice). The terminal ileum, the ileocecal valve  ?                          and the rectum were photographed. The bowel  ?                          preparation used was Plenvu via split dose  ?                          instruction. ?Scope In: 8:57:56 AM ?Scope Out: 9:18:49 AM ?Scope Withdrawal Time: 0 hours 15 minutes 2 seconds  ?Total Procedure Duration: 0 hours 20 minutes 53 seconds  ?Findings:                 The perianal and digital rectal examinations were  ?                          normal. Pertinent negatives include normal  ?                          sphincter tone and no palpable rectal lesions. ?  A 3 mm polyp was found in the cecum. The polyp was  ?                          sessile. The polyp was removed with a cold snare.  ?                          Resection and retrieval were complete. Estimated  ?                          blood loss was minimal. ?                          A 6 mm polyp was found in the transverse colon. The  ?                          polyp was semi-pedunculated. The polyp was removed  ?                          with a cold snare. Resection and retrieval were  ?                          complete. Estimated blood loss was minimal. ?                          A single angiodysplastic lesion without bleeding  ?                          was found in the ascending colon. ?                          The exam was otherwise normal throughout the  ?                          examined colon. ?                          The terminal ileum appeared normal. ?                           The retroflexed view of the distal rectum and anal  ?                          verge was normal and showed no anal or rectal  ?                          abnormalities. ?Complications:            No immediate complications. ?Estimated Blood Loss:     Estimated blood loss was minimal. ?Impression:               - One 3 mm polyp in the cecum, removed with a cold  ?                          snare. Resected and retrieved. ?                          -  One 6 mm polyp in the transverse colon, removed  ?                          with a cold snare. Resected and retrieved. ?                          - A single non-bleeding colonic angiodysplastic  ?                          lesion. ?                          - The examined portion of the ileum was normal. ?                          - The distal rectum and anal verge are normal on  ?                          retroflexion view. ?Recommendation:           - Patient has a contact number available for  ?                          emergencies. The signs and symptoms of potential  ?                          delayed complications were discussed with the  ?                          patient. Return to normal activities tomorrow.  ?                          Written discharge instructions were provided to the  ?                          patient. ?                          - Resume previous diet. ?                          - Continue present medications. ?                          - Await pathology results. ?                          - Repeat colonoscopy in 3 years because the bowel  ?                          preparation was suboptimal. ? E. Candis Schatz, MD ?10/15/2021 9:32:59 AM ?This report has been signed electronically. ?

## 2021-10-17 ENCOUNTER — Telehealth: Payer: Self-pay

## 2021-10-17 ENCOUNTER — Telehealth: Payer: Self-pay | Admitting: *Deleted

## 2021-10-17 NOTE — Telephone Encounter (Signed)
Attempted f/u call. No answer, left VM. 

## 2021-10-17 NOTE — Telephone Encounter (Signed)
?  Follow up Call- ? ? ?  10/15/2021  ?  7:43 AM  ?Call back number  ?Post procedure Call Back phone  # [phone]  ?Permission to leave phone message Yes  ?  ? ?Patient questions: ? ?Do you have a fever, pain , or abdominal swelling? "Sore throat" ?Pain Score  0 ? ?Have you tolerated food without any problems? Yes.   ? ?Have you been able to return to your normal activities? Yes.   ? ?Do you have any questions about your discharge instructions: ?Diet   No. ?Medications  No. ?Follow up visit  No. ? ?Do you have questions or concerns about your Care? No. ? ?Actions: ?* If pain score is 4 or above: ?No action needed, pain <4. ? ? ?

## 2021-10-21 ENCOUNTER — Encounter: Payer: Self-pay | Admitting: Gastroenterology

## 2021-10-22 ENCOUNTER — Encounter: Payer: Self-pay | Admitting: Family Medicine

## 2021-10-22 ENCOUNTER — Ambulatory Visit (INDEPENDENT_AMBULATORY_CARE_PROVIDER_SITE_OTHER): Payer: BC Managed Care – PPO | Admitting: Family Medicine

## 2021-10-22 VITALS — BP 124/68 | HR 86 | Temp 98.7°F | Ht 65.0 in | Wt 271.2 lb

## 2021-10-22 DIAGNOSIS — J029 Acute pharyngitis, unspecified: Secondary | ICD-10-CM | POA: Diagnosis not present

## 2021-10-22 DIAGNOSIS — H1033 Unspecified acute conjunctivitis, bilateral: Secondary | ICD-10-CM | POA: Insufficient documentation

## 2021-10-22 HISTORY — DX: Unspecified acute conjunctivitis, bilateral: H10.33

## 2021-10-22 MED ORDER — ERYTHROMYCIN 5 MG/GM OP OINT: TOPICAL_OINTMENT | OPHTHALMIC | 0 refills | Status: DC

## 2021-10-22 MED ORDER — AZITHROMYCIN 250 MG PO TABS: ORAL_TABLET | ORAL | 0 refills | Status: AC

## 2021-10-22 NOTE — Progress Notes (Signed)
Thomas [name],  ?The biopsies from your stomach showed nonspecific inflammatory changes, but no precancerous changes and no evidence of H. Pylori infection.  No further treatment or testing is necessary. ? ?One polyp which I removed during your recent procedure was proven to be completely benign but is considered a "pre-cancerous" polyp that MAY have grown into cancer if it had not been removed.  Studies shows that at least 20% of women over age 44 and 30% of men over age 37 have pre-cancerous polyps.  Because of the suboptimal bowel prep quality, I recommend you repeat colonoscopy in 3 years. ? ?If you develop any new rectal bleeding, abdominal pain or significant bowel habit changes, please contact me before then. ? ?

## 2021-10-22 NOTE — Progress Notes (Signed)
? ?Established Patient Office Visit ? ?Subjective   ?Patient ID: Thomas [name], male    DOB: [birth year]  Age: 46 y.o. MRN: [id number] ? ?Chief Complaint  ?Patient presents with  ? Allergies  ?  Both eyes painful red in color, very itchy. Throat still sore 1 week after procedure.   ? ? ?HPI 1 week history of sore throat bilateral conjunctivitis.  Denies fever chills, headache, sneezing, postnasal drip, cough, myalgias arthralgias, tightness in the chest or wheezing.  Neither seems to be improving. ? ? ? ?Review of Systems  ?Constitutional: Negative.   ?HENT:  Positive for sore throat. Negative for congestion, ear discharge, ear pain, hearing loss and sinus pain.   ?Eyes:  Positive for discharge and redness. Negative for blurred vision.  ?Respiratory: Negative.  Negative for cough, sputum production, shortness of breath, wheezing and stridor.   ?Cardiovascular: Negative.   ?Gastrointestinal:  Negative for abdominal pain.  ?Genitourinary: Negative.   ?Musculoskeletal: Negative.  Negative for joint pain and myalgias.  ?Skin:  Negative for rash.  ?Neurological:  Negative for tingling, loss of consciousness, weakness and headaches.  ?Endo/Heme/Allergies:  Negative for polydipsia.  ? ?  ?Objective:  ?  ? ?BP 124/68 (BP Location: Right Arm, Patient Position: Sitting, Cuff Size: Large)   Pulse 86   Temp 98.7 ?F (37.1 ?C) (Temporal)   Ht '5\' 5"'$  (1.651 m)   Wt 271 lb 3.2 oz (123 kg)   SpO2 98%   BMI 45.13 kg/m?  ? ? ?Physical Exam ?Constitutional:   ?   General: He is not in acute distress. ?   Appearance: Normal appearance. He is not ill-appearing, toxic-appearing or diaphoretic.  ?HENT:  ?   Head: Normocephalic and atraumatic.  ?   Right Ear: Tympanic membrane, ear canal and external ear normal.  ?   Left Ear: Tympanic membrane, ear canal and external ear normal.  ?   Mouth/Throat:  ?   Mouth: Mucous membranes are moist.  ?   Pharynx: Oropharynx is clear. No oropharyngeal exudate or posterior oropharyngeal erythema.   ?Eyes:  ?   General: No scleral icterus. ?   Extraocular Movements: Extraocular movements intact.  ?   Conjunctiva/sclera:  ?   Right eye: Right conjunctiva is injected. No exudate. ?   Left eye: Left conjunctiva is injected. No exudate. ?   Pupils: Pupils are equal, round, and reactive to light.  ?Cardiovascular:  ?   Rate and Rhythm: Normal rate and regular rhythm.  ?Pulmonary:  ?   Effort: Pulmonary effort is normal. No respiratory distress.  ?   Breath sounds: Normal breath sounds. No wheezing or rales.  ?Abdominal:  ?   General: Bowel sounds are normal.  ?Musculoskeletal:  ?   Cervical back: No rigidity or tenderness.  ?Skin: ?   General: Skin is warm and dry.  ?Neurological:  ?   Mental Status: He is alert and oriented to person, place, and time.  ?Psychiatric:     ?   Mood and Affect: Mood normal.     ?   Behavior: Behavior normal.  ? ? ? ?No results found for any visits on 10/22/21. ? ? ? ?The 10-year ASCVD risk score (Arnett DK, et al., 2019) is: 2.9% ? ?  ?Assessment & Plan:  ? ?Problem List Items Addressed This Visit   ? ?  ? Respiratory  ? Pharyngitis - Primary  ? Relevant Medications  ? azithromycin (ZITHROMAX) 250 MG tablet  ?  ? Other  ?  Acute conjunctivitis of both eyes  ? Relevant Medications  ? erythromycin ophthalmic ointment  ? ? ?Return in about 1 week (around 10/29/2021), or if symptoms worsen or fail to improve.  ? ? ?Libby Maw, MD ? ?

## 2021-10-25 NOTE — Addendum Note (Signed)
Addended by: Jon Billings on: 10/25/2021 02:46 PM   Modules accepted: Orders

## 2021-12-09 ENCOUNTER — Other Ambulatory Visit: Payer: Self-pay | Admitting: Gastroenterology

## 2021-12-09 DIAGNOSIS — K7030 Alcoholic cirrhosis of liver without ascites: Secondary | ICD-10-CM

## 2021-12-16 ENCOUNTER — Ambulatory Visit: Payer: BC Managed Care – PPO | Admitting: Gastroenterology

## 2022-02-14 ENCOUNTER — Ambulatory Visit: Payer: BC Managed Care – PPO | Admitting: Family Medicine

## 2022-02-17 ENCOUNTER — Ambulatory Visit: Payer: BC Managed Care – PPO | Admitting: Family Medicine

## 2022-02-17 ENCOUNTER — Telehealth: Payer: Self-pay | Admitting: Family Medicine

## 2022-02-17 NOTE — Telephone Encounter (Signed)
Pt was a no show for an OV with Dr. Ethelene Hal on 02/17/22, I sent a no show letter.

## 2022-02-19 NOTE — Telephone Encounter (Signed)
1st no show, fee waived, letter sent KO

## 2022-05-13 ENCOUNTER — Encounter: Payer: Self-pay | Admitting: Family Medicine

## 2022-05-13 ENCOUNTER — Ambulatory Visit (INDEPENDENT_AMBULATORY_CARE_PROVIDER_SITE_OTHER): Payer: BC Managed Care – PPO | Admitting: Family Medicine

## 2022-05-13 VITALS — BP 110/68 | HR 62 | Temp 98.1°F | Ht 65.0 in | Wt 283.8 lb

## 2022-05-13 DIAGNOSIS — Z23 Encounter for immunization: Secondary | ICD-10-CM | POA: Insufficient documentation

## 2022-05-13 DIAGNOSIS — Z0000 Encounter for general adult medical examination without abnormal findings: Secondary | ICD-10-CM

## 2022-05-13 NOTE — Progress Notes (Signed)
Established Patient Office Visit   Subjective:  Patient ID: Thomas [name], male    DOB: 10-22-1975  Age: 46 y.o. MRN: [id number]  Chief Complaint  Patient presents with   Follow-up    Routine follow up, no concerns. Patient here for vaccines.     HPI Encounter Diagnoses  Name Primary?   Healthcare maintenance Yes   Need for influenza vaccination    Need for Tdap vaccination    For health check and follow-up.  Needs a Tdap and flu vaccines.  Doing well.  Remains sober for the most part.  He smokes cigars on rare occasion.  Has regular dental care and is exercising regularly.  Is planning to visit [place] towards the end of the month.   Review of Systems  Constitutional: Negative.   HENT: Negative.    Eyes:  Negative for blurred vision, discharge and redness.  Respiratory: Negative.    Cardiovascular: Negative.   Gastrointestinal:  Negative for abdominal pain.  Genitourinary: Negative.   Musculoskeletal: Negative.  Negative for myalgias.  Skin:  Negative for rash.  Neurological:  Negative for tingling, loss of consciousness and weakness.  Endo/Heme/Allergies:  Negative for polydipsia.     Current Outpatient Medications:    buPROPion (WELLBUTRIN XL) 150 MG 24 hr tablet, Take 150 mg by mouth daily., Disp: , Rfl:    Multiple Vitamin (MULTIVITAMIN) capsule, Take 1 capsule by mouth daily., Disp: , Rfl:    pantoprazole (PROTONIX) 40 MG tablet, TAKE 1 TABLET BY MOUTH EVERY DAY, Disp: 90 tablet, Rfl: 3   propranolol (INDERAL) 20 MG tablet, TAKE 1 TABLET (20 MG TOTAL) BY MOUTH 2 (TWO) TIMES DAILY. TAKE '20MG'$  TWICE DAILY. GOAL IS A RESTING HEART RATE OF 55-60., Disp: 180 tablet, Rfl: 2   SKYRIZI PEN 150 MG/ML SOAJ, Inject into the skin., Disp: , Rfl:    Objective:     BP 110/68   Pulse 62   Temp 98.1 F (36.7 C) (Temporal)   Ht '5\' 5"'$  (1.651 m)   Wt 283 lb 12.8 oz (128.7 kg)   SpO2 98%   BMI 47.23 kg/m    Physical Exam Constitutional:      General: He is not in acute  distress.    Appearance: Normal appearance. He is not ill-appearing, toxic-appearing or diaphoretic.  HENT:     Head: Normocephalic and atraumatic.     Right Ear: External ear normal.     Left Ear: External ear normal.     Mouth/Throat:     Mouth: Mucous membranes are moist.     Pharynx: Oropharynx is clear. No oropharyngeal exudate or posterior oropharyngeal erythema.  Eyes:     General: No scleral icterus.       Right eye: No discharge.        Left eye: No discharge.     Extraocular Movements: Extraocular movements intact.     Conjunctiva/sclera: Conjunctivae normal.     Pupils: Pupils are equal, round, and reactive to light.  Cardiovascular:     Rate and Rhythm: Normal rate and regular rhythm.  Pulmonary:     Effort: Pulmonary effort is normal. No respiratory distress.     Breath sounds: Normal breath sounds.  Abdominal:     General: Bowel sounds are normal.  Musculoskeletal:     Cervical back: No rigidity or tenderness.  Lymphadenopathy:     Cervical: No cervical adenopathy.  Skin:    General: Skin is warm and dry.  Neurological:     Mental  Status: He is alert and oriented to person, place, and time.  Psychiatric:        Mood and Affect: Mood normal.        Behavior: Behavior normal.      No results found for any visits on 05/13/22.    The 10-year ASCVD risk score (Arnett DK, et al., 2019) is: 2.3%    Assessment & Plan:   Healthcare maintenance -     CBC; Future -     Comprehensive metabolic panel; Future -     Lipid panel; Future -     Urinalysis, Routine w reflex microscopic; Future  Need for influenza vaccination -     Flu Vaccine QUAD 63moIM (Fluarix, Fluzone & Alfiuria Quad PF)  Need for Tdap vaccination -     Tdap vaccine greater than or equal to 7yo IM    Return in about 1 year (around 05/14/2023), or if symptoms worsen or fail to improve.  Information was given on exercising to lose weight.  Information also given on health maintenance and  disease prevention.  WLibby Maw MD

## 2022-07-04 ENCOUNTER — Telehealth: Payer: Self-pay | Admitting: Family Medicine

## 2022-07-04 ENCOUNTER — Other Ambulatory Visit: Payer: Self-pay

## 2022-07-04 ENCOUNTER — Inpatient Hospital Stay (HOSPITAL_COMMUNITY)
Admission: EM | Admit: 2022-07-04 | Discharge: 2022-07-06 | DRG: 432 | Disposition: A | Discharge status: Home / Self Care | Payer: BC Managed Care – PPO | Attending: Internal Medicine | Admitting: Internal Medicine

## 2022-07-04 DIAGNOSIS — I8511 Secondary esophageal varices with bleeding: Secondary | ICD-10-CM | POA: Diagnosis not present

## 2022-07-04 DIAGNOSIS — K766 Portal hypertension: Secondary | ICD-10-CM | POA: Diagnosis not present

## 2022-07-04 DIAGNOSIS — I864 Gastric varices: Secondary | ICD-10-CM | POA: Diagnosis present

## 2022-07-04 DIAGNOSIS — Z8249 Family history of ischemic heart disease and other diseases of the circulatory system: Secondary | ICD-10-CM | POA: Diagnosis not present

## 2022-07-04 DIAGNOSIS — E559 Vitamin D deficiency, unspecified: Secondary | ICD-10-CM | POA: Diagnosis present

## 2022-07-04 DIAGNOSIS — G4700 Insomnia, unspecified: Secondary | ICD-10-CM | POA: Diagnosis present

## 2022-07-04 DIAGNOSIS — K921 Melena: Secondary | ICD-10-CM

## 2022-07-04 DIAGNOSIS — G4733 Obstructive sleep apnea (adult) (pediatric): Secondary | ICD-10-CM | POA: Diagnosis present

## 2022-07-04 DIAGNOSIS — Z79899 Other long term (current) drug therapy: Secondary | ICD-10-CM | POA: Diagnosis not present

## 2022-07-04 DIAGNOSIS — L4050 Arthropathic psoriasis, unspecified: Secondary | ICD-10-CM | POA: Diagnosis not present

## 2022-07-04 DIAGNOSIS — K2971 Gastritis, unspecified, with bleeding: Secondary | ICD-10-CM | POA: Diagnosis present

## 2022-07-04 DIAGNOSIS — F17290 Nicotine dependence, other tobacco product, uncomplicated: Secondary | ICD-10-CM | POA: Diagnosis present

## 2022-07-04 DIAGNOSIS — K7031 Alcoholic cirrhosis of liver with ascites: Secondary | ICD-10-CM | POA: Diagnosis not present

## 2022-07-04 DIAGNOSIS — K920 Hematemesis: Secondary | ICD-10-CM | POA: Diagnosis not present

## 2022-07-04 DIAGNOSIS — Z833 Family history of diabetes mellitus: Secondary | ICD-10-CM | POA: Diagnosis not present

## 2022-07-04 DIAGNOSIS — K3189 Other diseases of stomach and duodenum: Secondary | ICD-10-CM | POA: Diagnosis not present

## 2022-07-04 DIAGNOSIS — D696 Thrombocytopenia, unspecified: Secondary | ICD-10-CM | POA: Diagnosis not present

## 2022-07-04 DIAGNOSIS — I8510 Secondary esophageal varices without bleeding: Secondary | ICD-10-CM | POA: Diagnosis not present

## 2022-07-04 DIAGNOSIS — K922 Gastrointestinal hemorrhage, unspecified: Secondary | ICD-10-CM | POA: Diagnosis not present

## 2022-07-04 DIAGNOSIS — K7030 Alcoholic cirrhosis of liver without ascites: Principal | ICD-10-CM | POA: Diagnosis present

## 2022-07-04 DIAGNOSIS — D689 Coagulation defect, unspecified: Secondary | ICD-10-CM | POA: Diagnosis present

## 2022-07-04 DIAGNOSIS — K7460 Unspecified cirrhosis of liver: Secondary | ICD-10-CM | POA: Diagnosis not present

## 2022-07-04 DIAGNOSIS — Z885 Allergy status to narcotic agent status: Secondary | ICD-10-CM

## 2022-07-04 LAB — COMPREHENSIVE METABOLIC PANEL
ALT: 53 U/L — ABNORMAL HIGH (ref 0–44)
AST: 73 U/L — ABNORMAL HIGH (ref 15–41)
Albumin: 2.6 g/dL — ABNORMAL LOW (ref 3.5–5.0)
Alkaline Phosphatase: 120 U/L (ref 38–126)
Anion gap: 8 (ref 5–15)
BUN: 16 mg/dL (ref 6–20)
CO2: 27 mmol/L (ref 22–32)
Calcium: 9.1 mg/dL (ref 8.9–10.3)
Chloride: 106 mmol/L (ref 98–111)
Creatinine, Ser: 0.8 mg/dL (ref 0.61–1.24)
GFR, Estimated: >60 mL/min (ref >60)
Glucose, Bld: 93 mg/dL (ref 70–99)
Potassium: 4.6 mmol/L (ref 3.5–5.1)
Sodium: 141 mmol/L (ref 135–145)
Total Bilirubin: 2.7 mg/dL — ABNORMAL HIGH (ref 0.3–1.2)
Total Protein: 6.2 g/dL — ABNORMAL LOW (ref 6.5–8.1)

## 2022-07-04 LAB — ABO/RH: ABO/RH(D): O POS

## 2022-07-04 LAB — CBC
HCT: 36.2 % — ABNORMAL LOW (ref 39.0–52.0)
Hemoglobin: 11.7 g/dL — ABNORMAL LOW (ref 13.0–17.0)
MCH: 30 pg (ref 26.0–34.0)
MCHC: 32.3 g/dL (ref 30.0–36.0)
MCV: 92.8 fL (ref 80.0–100.0)
Platelets: 133 10*3/uL — ABNORMAL LOW (ref 150–400)
RBC: 3.9 MIL/uL — ABNORMAL LOW (ref 4.22–5.81)
RDW: 16.7 % — ABNORMAL HIGH (ref 11.5–15.5)
WBC: 5.9 10*3/uL (ref 4.0–10.5)
nRBC: 0 % (ref 0.0–0.2)

## 2022-07-04 LAB — TYPE AND SCREEN
ABO/RH(D): O POS
Antibody Screen: NEGATIVE

## 2022-07-04 LAB — PROTIME-INR
INR: 1.5 — ABNORMAL HIGH (ref 0.8–1.2)
Prothrombin Time: 17.7 seconds — ABNORMAL HIGH (ref 11.4–15.2)

## 2022-07-04 NOTE — Telephone Encounter (Signed)
FYI: Returned patients call per patient he just recently got over a cold with some vomiting a 1 week ago. Patient have noticed blood in his stool sometimes not sure what he should do. Advised patient to go to ED if this continues and he;'s not able to come to office. Patient agrees to be seen at ER if this persist. Appointment scheduled for 07/07/22

## 2022-07-04 NOTE — ED Provider Triage Note (Signed)
Emergency Medicine Provider Triage Evaluation Note  Thomas [name] , a 47 y.o. male  was evaluated in triage.  Pt complains of hematemesis, melena.  Patient reports history of esophageal varices.  Reports symptoms beginning this morning with bright red blood in his vomit.  Upon retrospective analysis, patient reports noticing darker colored stools over the past 2 to 3 days.  Denies any anticoagulation.  Denies abdominal pain, fever..  Review of Systems  Positive: See abov Negative:   Physical Exam  BP 138/83 (BP Location: Right Arm)   Pulse 78   Temp 99.6 F (37.6 C)   Resp 18   SpO2 97%  Gen:   Awake, no distress   Resp:  Normal effort  MSK:   Moves extremities without difficulty  Other:    Medical Decision Making  Medically screening exam initiated at 8:43 PM.  Appropriate orders placed.  RIGGINS CISEK was informed that the remainder of the evaluation will be completed by another provider, this initial triage assessment does not replace that evaluation, and the importance of remaining in the ED until their evaluation is complete.     [name], [place] 07/04/22 2100

## 2022-07-04 NOTE — Telephone Encounter (Signed)
Caller Name: Alyaan Call back phone #: [phone]  Reason for Call: pt is experiencing some more gasto issues. He is seeing blood in his stool again. He said it't not as bad as the other time. He wants to get ahead of it this time.  He wants to know what happens next?

## 2022-07-04 NOTE — ED Triage Notes (Signed)
Pt presents with hx of varices in the past. States he has had vomiting with BRB noticed.  Pt also today noted dark stools.  Pt is worried his bleeding varices have returned.  Reports illness last week.

## 2022-07-05 ENCOUNTER — Inpatient Hospital Stay (HOSPITAL_COMMUNITY): Payer: BC Managed Care – PPO | Admitting: Anesthesiology

## 2022-07-05 ENCOUNTER — Emergency Department (HOSPITAL_COMMUNITY): Payer: BC Managed Care – PPO

## 2022-07-05 ENCOUNTER — Encounter (HOSPITAL_COMMUNITY): Payer: Self-pay | Admitting: Family Medicine

## 2022-07-05 ENCOUNTER — Encounter (HOSPITAL_COMMUNITY)
Admission: EM | Disposition: A | Discharge status: Home / Self Care | Payer: Self-pay | Source: Home / Self Care | Attending: Internal Medicine

## 2022-07-05 DIAGNOSIS — E559 Vitamin D deficiency, unspecified: Secondary | ICD-10-CM | POA: Diagnosis present

## 2022-07-05 DIAGNOSIS — Z79899 Other long term (current) drug therapy: Secondary | ICD-10-CM | POA: Diagnosis not present

## 2022-07-05 DIAGNOSIS — K766 Portal hypertension: Secondary | ICD-10-CM | POA: Diagnosis present

## 2022-07-05 DIAGNOSIS — K2971 Gastritis, unspecified, with bleeding: Secondary | ICD-10-CM

## 2022-07-05 DIAGNOSIS — I8510 Secondary esophageal varices without bleeding: Secondary | ICD-10-CM | POA: Diagnosis not present

## 2022-07-05 DIAGNOSIS — I864 Gastric varices: Secondary | ICD-10-CM

## 2022-07-05 DIAGNOSIS — K922 Gastrointestinal hemorrhage, unspecified: Secondary | ICD-10-CM | POA: Diagnosis not present

## 2022-07-05 DIAGNOSIS — K7030 Alcoholic cirrhosis of liver without ascites: Secondary | ICD-10-CM | POA: Diagnosis present

## 2022-07-05 DIAGNOSIS — K7031 Alcoholic cirrhosis of liver with ascites: Secondary | ICD-10-CM

## 2022-07-05 DIAGNOSIS — K3189 Other diseases of stomach and duodenum: Secondary | ICD-10-CM | POA: Diagnosis not present

## 2022-07-05 DIAGNOSIS — G4700 Insomnia, unspecified: Secondary | ICD-10-CM | POA: Diagnosis present

## 2022-07-05 DIAGNOSIS — D689 Coagulation defect, unspecified: Secondary | ICD-10-CM | POA: Diagnosis present

## 2022-07-05 DIAGNOSIS — K920 Hematemesis: Secondary | ICD-10-CM | POA: Diagnosis not present

## 2022-07-05 DIAGNOSIS — Z8249 Family history of ischemic heart disease and other diseases of the circulatory system: Secondary | ICD-10-CM | POA: Diagnosis not present

## 2022-07-05 DIAGNOSIS — D696 Thrombocytopenia, unspecified: Secondary | ICD-10-CM | POA: Diagnosis present

## 2022-07-05 DIAGNOSIS — F17290 Nicotine dependence, other tobacco product, uncomplicated: Secondary | ICD-10-CM | POA: Diagnosis present

## 2022-07-05 DIAGNOSIS — K921 Melena: Secondary | ICD-10-CM

## 2022-07-05 DIAGNOSIS — Z833 Family history of diabetes mellitus: Secondary | ICD-10-CM | POA: Diagnosis not present

## 2022-07-05 DIAGNOSIS — I8511 Secondary esophageal varices with bleeding: Secondary | ICD-10-CM | POA: Diagnosis present

## 2022-07-05 DIAGNOSIS — G4733 Obstructive sleep apnea (adult) (pediatric): Secondary | ICD-10-CM | POA: Diagnosis present

## 2022-07-05 DIAGNOSIS — L4050 Arthropathic psoriasis, unspecified: Secondary | ICD-10-CM | POA: Diagnosis present

## 2022-07-05 DIAGNOSIS — Z885 Allergy status to narcotic agent status: Secondary | ICD-10-CM | POA: Diagnosis not present

## 2022-07-05 HISTORY — PX: ESOPHAGOGASTRODUODENOSCOPY (EGD) WITH PROPOFOL: SHX5813

## 2022-07-05 LAB — COMPREHENSIVE METABOLIC PANEL
ALT: 48 U/L — ABNORMAL HIGH (ref 0–44)
AST: 71 U/L — ABNORMAL HIGH (ref 15–41)
Albumin: 2.5 g/dL — ABNORMAL LOW (ref 3.5–5.0)
Alkaline Phosphatase: 111 U/L (ref 38–126)
Anion gap: 6 (ref 5–15)
BUN: 18 mg/dL (ref 6–20)
CO2: 25 mmol/L (ref 22–32)
Calcium: 8.5 mg/dL — ABNORMAL LOW (ref 8.9–10.3)
Chloride: 108 mmol/L (ref 98–111)
Creatinine, Ser: 0.76 mg/dL (ref 0.61–1.24)
GFR, Estimated: >60 mL/min (ref >60)
Glucose, Bld: 84 mg/dL (ref 70–99)
Potassium: 3.9 mmol/L (ref 3.5–5.1)
Sodium: 139 mmol/L (ref 135–145)
Total Bilirubin: 2.7 mg/dL — ABNORMAL HIGH (ref 0.3–1.2)
Total Protein: 5.6 g/dL — ABNORMAL LOW (ref 6.5–8.1)

## 2022-07-05 LAB — HEMATOCRIT
HCT: 32.9 % — ABNORMAL LOW (ref 39.0–52.0)
HCT: 30 % — ABNORMAL LOW (ref 39.0–52.0)
HCT: 28.9 % — ABNORMAL LOW (ref 39.0–52.0)

## 2022-07-05 LAB — HEMOGLOBIN
Hemoglobin: 11.2 g/dL — ABNORMAL LOW (ref 13.0–17.0)
Hemoglobin: 9.6 g/dL — ABNORMAL LOW (ref 13.0–17.0)
Hemoglobin: 9.9 g/dL — ABNORMAL LOW (ref 13.0–17.0)

## 2022-07-05 LAB — PHOSPHORUS: Phosphorus: 3.1 mg/dL (ref 2.5–4.6)

## 2022-07-05 LAB — LIPASE, BLOOD: Lipase: 50 U/L (ref 11–51)

## 2022-07-05 LAB — POC OCCULT BLOOD, ED: Fecal Occult Bld: POSITIVE — AB

## 2022-07-05 LAB — HIV ANTIBODY (ROUTINE TESTING W REFLEX): HIV Screen 4th Generation wRfx: NONREACTIVE

## 2022-07-05 LAB — MAGNESIUM: Magnesium: 1.7 mg/dL (ref 1.7–2.4)

## 2022-07-05 SURGERY — ESOPHAGOGASTRODUODENOSCOPY (EGD) WITH PROPOFOL
Anesthesia: Monitor Anesthesia Care

## 2022-07-05 MED ORDER — ONDANSETRON HCL 4 MG PO TABS: 4.0000 mg | ORAL_TABLET | Freq: Four times a day (QID) | ORAL | Status: DC | PRN

## 2022-07-05 MED ORDER — PANTOPRAZOLE SODIUM 40 MG IV SOLR
40.0000 mg | Freq: Two times a day (BID) | INTRAVENOUS | Status: DC
  Administered 2022-07-05 – 2022-07-06 (×3): 40 mg via INTRAVENOUS
  Filled 2022-07-05 (×4): qty 10

## 2022-07-05 MED ORDER — SODIUM CHLORIDE 0.9 % IV SOLN
50.0000 ug/h | INTRAVENOUS | Status: DC
  Administered 2022-07-05 (×2): 50 ug/h via INTRAVENOUS
  Filled 2022-07-05 (×2): qty 1

## 2022-07-05 MED ORDER — LACTATED RINGERS IV SOLN: INTRAVENOUS | Status: DC | PRN

## 2022-07-05 MED ORDER — LACTATED RINGERS IV SOLN
INTRAVENOUS | Status: AC | PRN
  Administered 2022-07-05: 20 mL/h via INTRAVENOUS

## 2022-07-05 MED ORDER — ACETAMINOPHEN 650 MG RE SUPP: 650.0000 mg | Freq: Four times a day (QID) | RECTAL | Status: DC | PRN

## 2022-07-05 MED ORDER — PROPRANOLOL HCL 10 MG PO TABS
20.0000 mg | ORAL_TABLET | Freq: Two times a day (BID) | ORAL | Status: DC
  Administered 2022-07-05 – 2022-07-06 (×2): 20 mg via ORAL
  Filled 2022-07-05 (×3): qty 2
  Filled 2022-07-05 (×2): qty 1

## 2022-07-05 MED ORDER — PANTOPRAZOLE SODIUM 40 MG IV SOLR
40.0000 mg | Freq: Once | INTRAVENOUS | Status: AC
  Administered 2022-07-05: 40 mg via INTRAVENOUS
  Filled 2022-07-05: qty 10

## 2022-07-05 MED ORDER — OCTREOTIDE LOAD VIA INFUSION
50.0000 ug | Freq: Once | INTRAVENOUS | Status: AC
  Administered 2022-07-05: 50 ug via INTRAVENOUS
  Filled 2022-07-05: qty 25

## 2022-07-05 MED ORDER — PROPOFOL 500 MG/50ML IV EMUL
INTRAVENOUS | Status: DC | PRN
  Administered 2022-07-05: 150 ug/kg/min via INTRAVENOUS

## 2022-07-05 MED ORDER — PROPOFOL 10 MG/ML IV BOLUS
INTRAVENOUS | Status: DC | PRN
  Administered 2022-07-05: 30 mg via INTRAVENOUS

## 2022-07-05 MED ORDER — ONDANSETRON HCL 4 MG/2ML IJ SOLN: 4.0000 mg | Freq: Four times a day (QID) | INTRAMUSCULAR | Status: DC | PRN

## 2022-07-05 MED ORDER — SODIUM CHLORIDE 0.9% FLUSH
3.0000 mL | Freq: Two times a day (BID) | INTRAVENOUS | Status: DC
  Administered 2022-07-05 – 2022-07-06 (×4): 3 mL via INTRAVENOUS

## 2022-07-05 MED ORDER — ACETAMINOPHEN 325 MG PO TABS: 650.0000 mg | ORAL_TABLET | Freq: Four times a day (QID) | ORAL | Status: DC | PRN

## 2022-07-05 MED ORDER — SODIUM CHLORIDE 0.9 % IV SOLN
1.0000 g | Freq: Every day | INTRAVENOUS | Status: DC
  Administered 2022-07-05 (×2): 1 g via INTRAVENOUS
  Filled 2022-07-05 (×2): qty 10

## 2022-07-05 MED ORDER — SODIUM CHLORIDE 0.9 % IV SOLN: INTRAVENOUS | Status: AC

## 2022-07-05 MED ORDER — SODIUM CHLORIDE 0.9 % IV SOLN: INTRAVENOUS | Status: DC

## 2022-07-05 SURGICAL SUPPLY — 15 items

## 2022-07-05 NOTE — H&P (View-Only) (Signed)
Referring Provider: EDP Primary Care Physician:  Libby Maw, MD Primary Gastroenterologist:  Dr. Candis Schatz  Reason for Consultation: Vomiting blood/dark stool  HPI: Thomas [name] is a 47 y.o. 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   He says that he had an episode of black stool on Thursday but thought it may be due to some of the cold medicines that he was taking.  The on Friday he had 3 episodes of vomiting blood.  Described as red blood, maybe some clots, all blood with very like bile like material.   He ran out of Protonix and his BB within the past week and did not get to the pharmacy to refill it because if had a cold that he was battling.  He continues to drink alcohol on occasion with last drink on New Year's Eve; drinking on special occasions.   No further sign of bleeding since Friday afternoon, prior to arrival at the hospital.  INR of 1.5, platelets of 133 K, Hgb 9.6 grams, total bili 2.7, alk phos 120, ALT 53, AST 75.  He has been started on PPI drip, octreotide drip, and IV Rocephin.  Colonoscopy 10/2021 with a couple of polyps removed.  Also had a nonbleeding colonic angiodysplastic lesion.  EGD 10/2021 had grade 2 esophageal varices, portal hypertensive gastropathy, and some gastric polyps.  1. Surgical [P], pre-pyloric stomach (polypoid lesions) - EROSION, ACUTE INFLAMMATION AND GRANULATION TISSUE CONSISTENT WITH POLYPOID GASTRITIS. - NO HELICOBACTER PYLORI IDENTIFIED. 2. Surgical [P], gastric body - OXYNTIC MUCOSA WITH FOCAL EROSION AND HYPEREMIA. - NO HELICOBACTER PYLORI IDENTIFIED. 3. 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 - BENIGN COLONIC MUCOSA (1).   Past Medical History:  Diagnosis Date   Alcoholic fatty liver 4/54/0981   Needs final HBV and  HAV vaccines on or after 10/25/2012    Alcoholism ([place]) 12/25/2011   Allergic rhinitis    Childhood asthma    Elevated transaminase level 2009   AST: 80 ALT: 136 in 8/11: Hepatitis A., B and C negative.    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    Sleep apnea     History reviewed. No pertinent surgical history.  Prior to Admission medications   Medication Sig Start Date End Date Taking? Authorizing Provider  buPROPion (WELLBUTRIN XL) 150 MG 24 hr tablet Take 150 mg by mouth daily.    [provider]  Multiple Vitamin (MULTIVITAMIN) capsule Take 1 capsule by mouth daily.    [provider]  pantoprazole (PROTONIX) 40 MG tablet TAKE 1 TABLET BY MOUTH EVERY DAY 09/30/21   Daryel November, MD  propranolol (INDERAL) 20 MG tablet TAKE 1 TABLET (20 MG TOTAL) BY MOUTH 2 (TWO) TIMES DAILY. TAKE '20MG'$  TWICE DAILY. GOAL IS A RESTING HEART RATE OF 55-60. 12/09/21   Daryel November, MD  SKYRIZI PEN 150 MG/ML SOAJ Inject into the skin. 08/28/21   [provider]    Current Facility-Administered Medications  Medication Dose Route Frequency Provider Last Rate Last Admin   0.9 %  sodium chloride infusion   Intravenous Continuous Opyd, Ilene Qua, MD 100 mL/hr at 07/05/22 0323 New Bag at 07/05/22 0323   acetaminophen (TYLENOL) tablet 650 mg  650 mg Oral Q6H PRN Opyd, Ilene Qua, MD  Or   acetaminophen (TYLENOL) suppository 650 mg  650 mg Rectal Q6H PRN Opyd, Ilene Qua, MD       cefTRIAXone (ROCEPHIN) 1 g in sodium chloride 0.9 % 100 mL IVPB  1 g Intravenous QHS Opyd, Ilene Qua, MD   Stopped at 07/05/22 0353   octreotide (SANDOSTATIN) 500 mcg in sodium chloride 0.9 % 250 mL (2 mcg/mL) infusion  50 mcg/hr Intravenous Continuous Opyd, Ilene Qua, MD 25 mL/hr at 07/05/22 0403 50 mcg/hr at 07/05/22 0403   ondansetron (ZOFRAN) tablet 4 mg  4 mg Oral Q6H PRN Opyd, Ilene Qua, MD       Or   ondansetron (ZOFRAN) injection 4 mg  4 mg  Intravenous Q6H PRN Opyd, Ilene Qua, MD       pantoprazole (PROTONIX) injection 40 mg  40 mg Intravenous Q12H Opyd, Ilene Qua, MD       sodium chloride flush (NS) 0.9 % injection 3 mL  3 mL Intravenous Q12H Opyd, Ilene Qua, MD   3 mL at 07/05/22 0755   Current Outpatient Medications  Medication Sig Dispense Refill   buPROPion (WELLBUTRIN XL) 150 MG 24 hr tablet Take 150 mg by mouth daily.     Multiple Vitamin (MULTIVITAMIN) capsule Take 1 capsule by mouth daily.     pantoprazole (PROTONIX) 40 MG tablet TAKE 1 TABLET BY MOUTH EVERY DAY 90 tablet 3   propranolol (INDERAL) 20 MG tablet TAKE 1 TABLET (20 MG TOTAL) BY MOUTH 2 (TWO) TIMES DAILY. TAKE '20MG'$  TWICE DAILY. GOAL IS A RESTING HEART RATE OF 55-60. 180 tablet 2   SKYRIZI PEN 150 MG/ML SOAJ Inject into the skin.      Allergies as of 07/04/2022 - Review Complete 07/04/2022  Allergen Reaction Noted   Codeine  02/13/2015    Family History  Problem Relation Age of Onset   Liver disease Mother    Depression Mother    Liver disease Father    Diabetes Father    Hypertension Father    Liver disease Sister    Diabetes Other    Colon cancer Neg Hx    Esophageal cancer Neg Hx    Stomach cancer Neg Hx    Colon polyps Neg Hx     Social History   Socioeconomic History   Marital status: Single    Spouse name: Not on file   Number of children: Not on file   Years of education: Not on file   Highest education level: Not on file  Occupational History   Not on file  Tobacco Use   Smoking status: Some Days    Types: Cigars   Smokeless tobacco: Never   Tobacco comments:    Cigars occasional  Vaping Use   Vaping Use: Never used  Substance and Sexual Activity   Alcohol use: Yes    Comment: Beer/wine 1 - 3 times a week (drinks heavily on weekends)   Drug use: Never    Comment: history of cocaine abuse   Sexual activity: Yes  Other Topics Concern   Not on file  Social History Narrative   Scientist, research (medical).  Single. Gets  regular exercise.             Social Determinants of Health   Financial Resource Strain: Not on file  Food Insecurity: Not on file  Transportation Needs: Not on file  Physical Activity: Not on file  Stress: Not on file  Social Connections: Not on file  Intimate Partner Violence: Not on file  Review of Systems: ROS is O/W negative except as mentioned in HPI.  Physical Exam: Vital signs in last 24 hours: Temp:  [98.3 F (36.8 C)-99.6 F (37.6 C)] 98.3 F (36.8 C) (01/27 0742) Pulse Rate:  [76-90] 85 (01/27 0744) Resp:  [17-19] 19 (01/27 0744) BP: (101-139)/(55-83) 119/72 (01/27 0744) SpO2:  [95 %-100 %] 100 % (01/27 0744) Weight:  [128.4 kg] 128.4 kg (01/27 0742)   General:  Alert, Well-developed, well-nourished, pleasant and cooperative in NAD Head:  Normocephalic and atraumatic. Eyes:  Sclera clear, no icterus.  Conjunctiva pink. Ears:  Normal auditory acuity. Mouth:  No deformity or lesions.   Lungs:  Clear throughout to auscultation.  No wheezes, crackles, or rhonchi.  Heart:  Regular rate and rhythm; no murmurs, clicks, rubs, or gallops. Abdomen:  Soft, non-distended.  BS present.  Non-tender.    Msk:  Symmetrical without gross deformities. Pulses:  Normal pulses noted. Extremities:  Without clubbing or edema. Neurologic:  Alert and oriented x 4;  grossly normal neurologically. Skin:  Intact without significant lesions or rashes. Psych:  Alert and cooperative. Normal mood and affect.  Intake/Output from previous day: 01/26 0701 - 01/27 0700 In: 97.2 [IV Piggyback:97.2] Out: -   Lab Results: Recent Labs    07/04/22 2037 07/05/22 0400 07/05/22 0826  WBC 5.9  --   --   HGB 11.7* 11.2* 9.6*  HCT 36.2* 32.9* 30.0*  PLT 133*  --   --    BMET Recent Labs    07/04/22 2037 07/05/22 0400  NA 141 139  K 4.6 3.9  CL 106 108  CO2 27 25  GLUCOSE 93 84  BUN 16 18  CREATININE 0.80 0.76  CALCIUM 9.1 8.5*   LFT Recent Labs    07/05/22 0400  PROT 5.6*   ALBUMIN 2.5*  AST 71*  ALT 48*  ALKPHOS 111  BILITOT 2.7*   PT/INR Recent Labs    07/04/22 2038  LABPROT 17.7*  INR 1.5*   Studies/Results: DG Chest 1 View  Result Date: 07/05/2022 CLINICAL DATA:  Hematemesis EXAM: CHEST  1 VIEW COMPARISON:  06/07/2013 FINDINGS: Lungs are clear.  No pleural effusion or pneumothorax. The heart is normal in size. IMPRESSION: No evidence of acute cardiopulmonary disease. Electronically Signed   By: Julian Hy M.D.   On: 07/05/2022 02:18    IMPRESSION:  *Upper GI bleed/hematemesis: Has history of esophageal varices grade 2 in May 2023.  Also has portal hypertensive gastropathy.  Hemoglobin 9.6 g this AM, which is down 2 g from initial evaluation, also although likely somewhat dilutional.  He appears down about a gram or two from his baseline.  He is hemodynamically stable. *Alcohol cirrhosis:  Still drinking on special occasions like holidays. *Mild coagulopathy with an INR of 1.5 *Mild thrombocytopenia with platelets of 133 K  PLAN: -Continue empiric PPI drip, octreotide drip, and IV Rocephin for possible variceal/upper GI bleed in known cirrhotic. -Monitor Hgb and transfuse prn. -EGD later this morning.  **MELD 3.0: 16 at 07/05/2022  4:00 AM MELD-Na: 15 at 07/05/2022  4:00 AM Calculated from: Serum Creatinine: 0.76 mg/dL (Using min of 1 mg/dL) at 07/05/2022  4:00 AM Serum Sodium: 139 mmol/L (Using max of 137 mmol/L) at 07/05/2022  4:00 AM Total Bilirubin: 2.7 mg/dL at 07/05/2022  4:00 AM Serum Albumin: 2.5 g/dL at 07/05/2022  4:00 AM INR(ratio): 1.5 at 07/04/2022  8:38 PM Age at listing (hypothetical): 20 years Sex: Male at 07/05/2022  4:00 AM    Janett Billow  D. Zehr  07/05/2022, 8:48 AM   Attending physician's note  I have taken a history, reviewed the chart and examined the patient. I performed a substantive portion of this encounter, including complete performance of at least one of the key components, in conjunction with the APP. I agree with  the APP's note, impression and recommendations.    47 year old male with history of decompensated cirrhosis, esophageal varices and portal hypertensive gastropathy admitted with melena and hematemesis  MELD 3.0: 16 at 07/05/2022  4:00 AM MELD-Na: 15 at 07/05/2022  4:00 AM   EGD to exclude variceal hemorrhage The risks and benefits as well as alternatives of endoscopic procedure(s) have been discussed and reviewed. All questions answered. The patient agrees to proceed.  Monitor hemoglobin and transfuse if below 7 Continue IV PPI gtt. and octreotide gtt. IV ceftriaxone for SBP prophylaxis  Discussed alcohol cessation    The patient was provided an opportunity to ask questions and all were answered. The patient agreed with the plan and demonstrated an understanding of the instructions.  Damaris Hippo , MD [phone]

## 2022-07-05 NOTE — Transfer of Care (Signed)
Immediate Anesthesia Transfer of Care Note  Patient: Thomas [name]  Procedure(s) Performed: ESOPHAGOGASTRODUODENOSCOPY (EGD) WITH PROPOFOL  Patient Location: PACU  Anesthesia Type:MAC  Level of Consciousness: drowsy and patient cooperative  Airway & Oxygen Therapy: Patient Spontanous Breathing and Patient connected to nasal cannula oxygen  Post-op Assessment: Report given to RN and Post -op Vital signs reviewed and stable  Post vital signs: Reviewed and stable  Last Vitals:  Vitals Value Taken Time  BP 112/58 07/05/22 1130  Temp    Pulse 84 07/05/22 1131  Resp 15 07/05/22 1131  SpO2 92 % 07/05/22 1131  Vitals shown include unvalidated device data.  Last Pain:  Vitals:   07/05/22 1033  TempSrc: Temporal  PainSc: 0-No pain         Complications: No notable events documented.

## 2022-07-05 NOTE — Hospital Course (Signed)
47 y.o. male with medical history significant for alcoholic cirrhosis and psoriatic arthritis who presents emergency department with hematemesis and melena. GI was consulted

## 2022-07-05 NOTE — Anesthesia Postprocedure Evaluation (Signed)
Anesthesia Post Note  Patient: Thomas [name]  Procedure(s) Performed: ESOPHAGOGASTRODUODENOSCOPY (EGD) WITH PROPOFOL     Patient location during evaluation: PACU Anesthesia Type: MAC Level of consciousness: awake Pain management: pain level controlled Vital Signs Assessment: post-procedure vital signs reviewed and stable Respiratory status: spontaneous breathing, nonlabored ventilation and respiratory function stable Cardiovascular status: blood pressure returned to baseline and stable Postop Assessment: no apparent nausea or vomiting Anesthetic complications: no   No notable events documented.  Last Vitals:  Vitals:   07/05/22 1135 07/05/22 1145  BP:  119/64  Pulse: 79 82  Resp: 18 17  Temp:    SpO2: 94% 93%    Last Pain:  Vitals:   07/05/22 1033  TempSrc: Temporal  PainSc: 0-No pain                  P 

## 2022-07-05 NOTE — Op Note (Signed)
[facility] Patient Name: Thomas [name] Procedure Date : 07/05/2022 MRN: [id number] Attending MD: Mauri Pole , MD, [id number] Date of Birth: [birth year] CSN: [id number] Age: 47 Admit Type: Emergency Department Procedure:                Upper GI endoscopy Indications:              Active gastrointestinal bleeding, Suspected upper                            gastrointestinal bleeding, Hematemesis, Melena Providers:                Mauri Pole, MD, Grace Isaac, RN, Cherylynn Ridges, Technician, Vickii Penna, CRNA Referring MD:              Medicines:                Monitored Anesthesia Care Complications:            No immediate complications. Estimated Blood Loss:     Estimated blood loss was minimal. Procedure:                Pre-Anesthesia Assessment:                           - Prior to the procedure, a History and Physical                            was performed, and patient medications and                            allergies were reviewed. The patient's tolerance of                            previous anesthesia was also reviewed. The risks                            and benefits of the procedure and the sedation                            options and risks were discussed with the patient.                            All questions were answered, and informed consent                            was obtained. Prior Anticoagulants: The patient has                            taken no anticoagulant or antiplatelet agents. ASA                            Grade Assessment: III - A patient with severe  systemic disease. After reviewing the risks and                            benefits, the patient was deemed in satisfactory                            condition to undergo the procedure.                           After obtaining informed consent, the endoscope was                            passed under direct  vision. Throughout the                            procedure, the patient's blood pressure, pulse, and                            oxygen saturations were monitored continuously. The                            [id number]-[id number] ([id number]) Olympus endoscope was introduced                            through the mouth, and advanced to the third part                            of duodenum. The upper GI endoscopy was                            accomplished without difficulty. The patient                            tolerated the procedure well. Scope In: Scope Out: Findings:      Three columns of grade II varices with no bleeding and no stigmata of       recent bleeding were found in the middle third of the esophagus and in       the lower third of the esophagus, 35 cm from the incisors. They were 5       mm in largest diameter. No red wale signs were present.      Type 2 isolated gastric varices (IGV2, varices located in the body,       antrum or around the pylorus) with no bleeding were found in the       prepyloric region of the stomach. There were no stigmata of recent       bleeding. They were 10 mm in largest diameter.      Patchy moderate inflammation with hemorrhage characterized by congestion       (edema), erosions, erythema and friability was found in the entire       examined stomach.      The cardia and gastric fundus were normal on retroflexion.      The examined duodenum was normal. Impression:               - Grade II esophageal varices with no bleeding  and                            no stigmata of recent bleeding.                           - Type 2 isolated gastric varices (IGV2, varices                            located in the body, antrum or around the pylorus),                            without bleeding.                           - Gastritis with hemorrhage.                           - Normal examined duodenum.                           - No specimens collected. Recommendation:            - Clear liquid diet today and then slowly advance.                           - DC octerotide                           - Start non selective beta blocker, Propranolol                            '20mg'$  BID and titrate up to goal Hr 50-60                           - Pantoprazole '40mg'$  IV during hospitalization,                            transition on oral on discharge                           - Continue present medications.                           - Continue Ceftriaxone X 5 days                           - Alcohol cessation and monitor for withdrawal Procedure Code(s):        --- Professional ---                           [phone], Esophagogastroduodenoscopy, flexible,                            transoral; diagnostic, including collection of  specimen(s) by brushing or washing, when performed                            (separate procedure) Diagnosis Code(s):        --- Professional ---                           I85.00, Esophageal varices without bleeding                           I86.4, Gastric varices                           K29.71, Gastritis, unspecified, with bleeding                           K92.2, Gastrointestinal hemorrhage, unspecified                           K92.0, Hematemesis                           K92.1, Melena (includes Hematochezia) CPT copyright 2022 [employer]. All rights reserved. The codes documented in this report are preliminary and upon coder review may  be revised to meet current compliance requirements. Mauri Pole, MD 07/05/2022 12:01:49 PM This report has been signed electronically. Number of Addenda: 0

## 2022-07-05 NOTE — Interval H&P Note (Signed)
History and Physical Interval Note:  07/05/2022 10:45 AM  Thomas [name]  has presented today for surgery, with the diagnosis of Hematemesis, cirrhosis, known varices.  The various methods of treatment have been discussed with the patient and family. After consideration of risks, benefits and other options for treatment, the patient has consented to  Procedure(s): ESOPHAGOGASTRODUODENOSCOPY (EGD) WITH PROPOFOL (N/A) as a surgical intervention.  The patient's history has been reviewed, patient examined, no change in status, stable for surgery.  I have reviewed the patient's chart and labs.  Questions were answered to the patient's satisfaction.      

## 2022-07-05 NOTE — Consult Note (Addendum)
Referring Provider: EDP Primary Care Physician:  Libby Maw, MD Primary Gastroenterologist:  Dr. Candis Schatz  Reason for Consultation: Vomiting blood/dark stool  HPI: Thomas [name] is a 47 y.o. 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   He says that he had an episode of black stool on Thursday but thought it may be due to some of the cold medicines that he was taking.  The on Friday he had 3 episodes of vomiting blood.  Described as red blood, maybe some clots, all blood with very like bile like material.   He ran out of Protonix and his BB within the past week and did not get to the pharmacy to refill it because if had a cold that he was battling.  He continues to drink alcohol on occasion with last drink on New Year's Eve; drinking on special occasions.   No further sign of bleeding since Friday afternoon, prior to arrival at the hospital.  INR of 1.5, platelets of 133 K, Hgb 9.6 grams, total bili 2.7, alk phos 120, ALT 53, AST 75.  He has been started on PPI drip, octreotide drip, and IV Rocephin.  Colonoscopy 10/2021 with a couple of polyps removed.  Also had a nonbleeding colonic angiodysplastic lesion.  EGD 10/2021 had grade 2 esophageal varices, portal hypertensive gastropathy, and some gastric polyps.  1. Surgical [P], pre-pyloric stomach (polypoid lesions) - EROSION, ACUTE INFLAMMATION AND GRANULATION TISSUE CONSISTENT WITH POLYPOID GASTRITIS. - NO HELICOBACTER PYLORI IDENTIFIED. 2. Surgical [P], gastric body - OXYNTIC MUCOSA WITH FOCAL EROSION AND HYPEREMIA. - NO HELICOBACTER PYLORI IDENTIFIED. 3. 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 - BENIGN COLONIC MUCOSA (1).   Past Medical History:  Diagnosis Date   Alcoholic fatty liver 1/47/8295   Needs final HBV and  HAV vaccines on or after 10/25/2012    Alcoholism ([place]) 12/25/2011   Allergic rhinitis    Childhood asthma    Elevated transaminase level 2009   AST: 80 ALT: 136 in 8/11: Hepatitis A., B and C negative.    Morbid obesity ([place])    Scrotal varices 8/11   Followed at [facility] urology.    Sleep apnea     History reviewed. No pertinent surgical history.  Prior to Admission medications   Medication Sig Start Date End Date Taking? Authorizing Provider  buPROPion (WELLBUTRIN XL) 150 MG 24 hr tablet Take 150 mg by mouth daily.    [provider]  Multiple Vitamin (MULTIVITAMIN) capsule Take 1 capsule by mouth daily.    [provider]  pantoprazole (PROTONIX) 40 MG tablet TAKE 1 TABLET BY MOUTH EVERY DAY 09/30/21   Daryel November, MD  propranolol (INDERAL) 20 MG tablet TAKE 1 TABLET (20 MG TOTAL) BY MOUTH 2 (TWO) TIMES DAILY. TAKE '20MG'$  TWICE DAILY. GOAL IS A RESTING HEART RATE OF 55-60. 12/09/21   Daryel November, MD  SKYRIZI PEN 150 MG/ML SOAJ Inject into the skin. 08/28/21   [provider]    Current Facility-Administered Medications  Medication Dose Route Frequency Provider Last Rate Last Admin   0.9 %  sodium chloride infusion   Intravenous Continuous Opyd, Ilene Qua, MD 100 mL/hr at 07/05/22 0323 New Bag at 07/05/22 0323   acetaminophen (TYLENOL) tablet 650 mg  650 mg Oral Q6H PRN Opyd, Ilene Qua, MD  Or   acetaminophen (TYLENOL) suppository 650 mg  650 mg Rectal Q6H PRN Opyd, Ilene Qua, MD       cefTRIAXone (ROCEPHIN) 1 g in sodium chloride 0.9 % 100 mL IVPB  1 g Intravenous QHS Opyd, Ilene Qua, MD   Stopped at 07/05/22 0353   octreotide (SANDOSTATIN) 500 mcg in sodium chloride 0.9 % 250 mL (2 mcg/mL) infusion  50 mcg/hr Intravenous Continuous Opyd, Ilene Qua, MD 25 mL/hr at 07/05/22 0403 50 mcg/hr at 07/05/22 0403   ondansetron (ZOFRAN) tablet 4 mg  4 mg Oral Q6H PRN Opyd, Ilene Qua, MD       Or   ondansetron (ZOFRAN) injection 4 mg  4 mg  Intravenous Q6H PRN Opyd, Ilene Qua, MD       pantoprazole (PROTONIX) injection 40 mg  40 mg Intravenous Q12H Opyd, Ilene Qua, MD       sodium chloride flush (NS) 0.9 % injection 3 mL  3 mL Intravenous Q12H Opyd, Ilene Qua, MD   3 mL at 07/05/22 0755   Current Outpatient Medications  Medication Sig Dispense Refill   buPROPion (WELLBUTRIN XL) 150 MG 24 hr tablet Take 150 mg by mouth daily.     Multiple Vitamin (MULTIVITAMIN) capsule Take 1 capsule by mouth daily.     pantoprazole (PROTONIX) 40 MG tablet TAKE 1 TABLET BY MOUTH EVERY DAY 90 tablet 3   propranolol (INDERAL) 20 MG tablet TAKE 1 TABLET (20 MG TOTAL) BY MOUTH 2 (TWO) TIMES DAILY. TAKE '20MG'$  TWICE DAILY. GOAL IS A RESTING HEART RATE OF 55-60. 180 tablet 2   SKYRIZI PEN 150 MG/ML SOAJ Inject into the skin.      Allergies as of 07/04/2022 - Review Complete 07/04/2022  Allergen Reaction Noted   Codeine  02/13/2015    Family History  Problem Relation Age of Onset   Liver disease Mother    Depression Mother    Liver disease Father    Diabetes Father    Hypertension Father    Liver disease Sister    Diabetes Other    Colon cancer Neg Hx    Esophageal cancer Neg Hx    Stomach cancer Neg Hx    Colon polyps Neg Hx     Social History   Socioeconomic History   Marital status: Single    Spouse name: Not on file   Number of children: Not on file   Years of education: Not on file   Highest education level: Not on file  Occupational History   Not on file  Tobacco Use   Smoking status: Some Days    Types: Cigars   Smokeless tobacco: Never   Tobacco comments:    Cigars occasional  Vaping Use   Vaping Use: Never used  Substance and Sexual Activity   Alcohol use: Yes    Comment: Beer/wine 1 - 3 times a week (drinks heavily on weekends)   Drug use: Never    Comment: history of cocaine abuse   Sexual activity: Yes  Other Topics Concern   Not on file  Social History Narrative   Scientist, research (medical).  Single. Gets  regular exercise.             Social Determinants of Health   Financial Resource Strain: Not on file  Food Insecurity: Not on file  Transportation Needs: Not on file  Physical Activity: Not on file  Stress: Not on file  Social Connections: Not on file  Intimate Partner Violence: Not on file  Review of Systems: ROS is O/W negative except as mentioned in HPI.  Physical Exam: Vital signs in last 24 hours: Temp:  [98.3 F (36.8 C)-99.6 F (37.6 C)] 98.3 F (36.8 C) (01/27 0742) Pulse Rate:  [76-90] 85 (01/27 0744) Resp:  [17-19] 19 (01/27 0744) BP: (101-139)/(55-83) 119/72 (01/27 0744) SpO2:  [95 %-100 %] 100 % (01/27 0744) Weight:  [128.4 kg] 128.4 kg (01/27 0742)   General:  Alert, Well-developed, well-nourished, pleasant and cooperative in NAD Head:  Normocephalic and atraumatic. Eyes:  Sclera clear, no icterus.  Conjunctiva pink. Ears:  Normal auditory acuity. Mouth:  No deformity or lesions.   Lungs:  Clear throughout to auscultation.  No wheezes, crackles, or rhonchi.  Heart:  Regular rate and rhythm; no murmurs, clicks, rubs, or gallops. Abdomen:  Soft, non-distended.  BS present.  Non-tender.    Msk:  Symmetrical without gross deformities. Pulses:  Normal pulses noted. Extremities:  Without clubbing or edema. Neurologic:  Alert and oriented x 4;  grossly normal neurologically. Skin:  Intact without significant lesions or rashes. Psych:  Alert and cooperative. Normal mood and affect.  Intake/Output from previous day: 01/26 0701 - 01/27 0700 In: 97.2 [IV Piggyback:97.2] Out: -   Lab Results: Recent Labs    07/04/22 2037 07/05/22 0400 07/05/22 0826  WBC 5.9  --   --   HGB 11.7* 11.2* 9.6*  HCT 36.2* 32.9* 30.0*  PLT 133*  --   --    BMET Recent Labs    07/04/22 2037 07/05/22 0400  NA 141 139  K 4.6 3.9  CL 106 108  CO2 27 25  GLUCOSE 93 84  BUN 16 18  CREATININE 0.80 0.76  CALCIUM 9.1 8.5*   LFT Recent Labs    07/05/22 0400  PROT 5.6*   ALBUMIN 2.5*  AST 71*  ALT 48*  ALKPHOS 111  BILITOT 2.7*   PT/INR Recent Labs    07/04/22 2038  LABPROT 17.7*  INR 1.5*   Studies/Results: DG Chest 1 View  Result Date: 07/05/2022 CLINICAL DATA:  Hematemesis EXAM: CHEST  1 VIEW COMPARISON:  06/07/2013 FINDINGS: Lungs are clear.  No pleural effusion or pneumothorax. The heart is normal in size. IMPRESSION: No evidence of acute cardiopulmonary disease. Electronically Signed   By: Julian Hy M.D.   On: 07/05/2022 02:18    IMPRESSION:  *Upper GI bleed/hematemesis: Has history of esophageal varices grade 2 in May 2023.  Also has portal hypertensive gastropathy.  Hemoglobin 9.6 g this AM, which is down 2 g from initial evaluation, also although likely somewhat dilutional.  He appears down about a gram or two from his baseline.  He is hemodynamically stable. *Alcohol cirrhosis:  Still drinking on special occasions like holidays. *Mild coagulopathy with an INR of 1.5 *Mild thrombocytopenia with platelets of 133 K  PLAN: -Continue empiric PPI drip, octreotide drip, and IV Rocephin for possible variceal/upper GI bleed in known cirrhotic. -Monitor Hgb and transfuse prn. -EGD later this morning.  **MELD 3.0: 16 at 07/05/2022  4:00 AM MELD-Na: 15 at 07/05/2022  4:00 AM Calculated from: Serum Creatinine: 0.76 mg/dL (Using min of 1 mg/dL) at 07/05/2022  4:00 AM Serum Sodium: 139 mmol/L (Using max of 137 mmol/L) at 07/05/2022  4:00 AM Total Bilirubin: 2.7 mg/dL at 07/05/2022  4:00 AM Serum Albumin: 2.5 g/dL at 07/05/2022  4:00 AM INR(ratio): 1.5 at 07/04/2022  8:38 PM Age at listing (hypothetical): 32 years Sex: Male at 07/05/2022  4:00 AM    Janett Billow  D. Zehr  07/05/2022, 8:48 AM   Attending physician's note  I have taken a history, reviewed the chart and examined the patient. I performed a substantive portion of this encounter, including complete performance of at least one of the key components, in conjunction with the APP. I agree with  the APP's note, impression and recommendations.    47 year old male with history of decompensated cirrhosis, esophageal varices and portal hypertensive gastropathy admitted with melena and hematemesis  MELD 3.0: 16 at 07/05/2022  4:00 AM MELD-Na: 15 at 07/05/2022  4:00 AM   EGD to exclude variceal hemorrhage The risks and benefits as well as alternatives of endoscopic procedure(s) have been discussed and reviewed. All questions answered. The patient agrees to proceed.  Monitor hemoglobin and transfuse if below 7 Continue IV PPI gtt. and octreotide gtt. IV ceftriaxone for SBP prophylaxis  Discussed alcohol cessation    The patient was provided an opportunity to ask questions and all were answered. The patient agreed with the plan and demonstrated an understanding of the instructions.  Damaris Hippo , MD [phone]

## 2022-07-05 NOTE — Progress Notes (Signed)
  Progress Note   Patient: Thomas [name] UEK:[id number] DOB: [birth year] DOA: 07/04/2022     0 DOS: the patient was seen and examined on 07/05/2022   Brief hospital course: 47 y.o. male with medical history significant for alcoholic cirrhosis and psoriatic arthritis who presents emergency department with hematemesis and melena. GI was consulted  Assessment and Plan: 1. Upper GI bleeding  - Pt with cirrhosis and hx of esophageal varices p/w hematemesis and melena  - Pt presented hemodynamically stable in ED and initial Hgb is 11.7  - Pt was continued on IV PPI and empiric octreotide -GI consulted and pt now s/p EGD, reviewed. Findings of grade 2 esophageal varices with no bleeding and no stigmata of recent bleed, type 2 gastric varices without bleeding, gastritis with hemorrhage -GI recs to d/c octreotide, cont IV PPI, start selective BB, propranolol and titrate up to HR 50-60, continue rocephin x 5 days   2. Cirrhosis  - MELD-Na is 15 on admission  - LFT's remain stable this AM -Follow LFT trends      Subjective: Seen prior to GI eval. Pt denied further bleed this AM  Physical Exam: Vitals:   07/05/22 1033 07/05/22 1130 07/05/22 1135 07/05/22 1145  BP: 129/73 (!) 112/58  119/64  Pulse: 81 85 79 82  Resp: '20 18 18 17  '$ Temp: 97.9 F (36.6 C)     TempSrc: Temporal     SpO2: 97% 91% 94% 93%  Weight:      Height:       General exam: Awake, laying in bed, in nad Respiratory system: Normal respiratory effort, no wheezing Cardiovascular system: regular rate, s1, s2 Gastrointestinal system: Soft, nondistended, positive BS Central nervous system: CN2-12 grossly intact, strength intact Extremities: Perfused, no clubbing Skin: Normal skin turgor, no notable skin lesions seen Psychiatry: Mood normal // no visual hallucinations   Data Reviewed:  Labs reviewed: Na 139, K 3.9, Cr 0.76, Hgb 9.9  Family Communication: Pt in room, family not at bedside  Disposition: Status is:  Inpatient Remains inpatient appropriate because: Severity of illness  Planned Discharge Destination: Home    Author: Marylu Lund, MD 07/05/2022 4:18 PM  For on call review [email or web address].

## 2022-07-05 NOTE — ED Notes (Signed)
Pt ambulating independently in hallways

## 2022-07-05 NOTE — H&P (Addendum)
History and Physical    Thomas [name] XAJ:[id number] DOB: [birth year] DOA: 07/04/2022  PCP: Libby Maw, MD   Patient coming from: Home   Chief Complaint: Vomiting blood, dark stool   HPI: Thomas [name] is a pleasant 47 y.o. male with medical history significant for alcoholic cirrhosis and psoriatic arthritis who presents emergency department with hematemesis and melena.  Patient reports 2 days of melena and 3 episodes of hematemesis since 4 AM on 07/04/2022.  He denies any abdominal pain.  He denies lightheadedness or syncope.  He ran out of Protonix approximately 2 weeks ago.  He continues to drink alcohol on occasion with last drink on New Year's Eve.   Psoriatic arthritis has been well-controlled since starting Skyrizi several months ago.   ED Course: Upon arrival to the ED, patient is found to be afebrile and saturating well on room air with normal heart rate and stable blood pressure.  Chemistry panel is notable for albumin 2.6, AST 73, ALT 53, and total bilirubin 2.7.  CBC features a hemoglobin of 11.7 and platelets 133,000.  INR is 1.5.  Fecal occult blood testing is positive and melanotic stool was noted on exam per report of ED physician.  Type and screen was performed and IV Protonix was started in the ED.  Review of Systems:  All other systems reviewed and apart from HPI, are negative.  Past Medical History:  Diagnosis Date   Alcoholic fatty liver 0/94/7096   Needs final HBV and HAV vaccines on or after 10/25/2012    Alcoholism ([place]) 12/25/2011   Allergic rhinitis    Childhood asthma    Elevated transaminase level 2009   AST: 80 ALT: 136 in 8/11: Hepatitis A., B and C negative.    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    Sleep apnea     History reviewed. No pertinent surgical history.  Social History:   reports that he has been smoking cigars. He has never used smokeless tobacco. He reports current alcohol use. He  reports that he does not use drugs.  Allergies  Allergen Reactions   Codeine     Family History  Problem Relation Age of Onset   Liver disease Mother    Depression Mother    Liver disease Father    Diabetes Father    Hypertension Father    Liver disease Sister    Diabetes Other    Colon cancer Neg Hx    Esophageal cancer Neg Hx    Stomach cancer Neg Hx    Colon polyps Neg Hx      Prior to Admission medications   Medication Sig Start Date End Date Taking? Authorizing Provider  buPROPion (WELLBUTRIN XL) 150 MG 24 hr tablet Take 150 mg by mouth daily.    [provider]  Multiple Vitamin (MULTIVITAMIN) capsule Take 1 capsule by mouth daily.    [provider]  pantoprazole (PROTONIX) 40 MG tablet TAKE 1 TABLET BY MOUTH EVERY DAY 09/30/21   Daryel November, MD  propranolol (INDERAL) 20 MG tablet TAKE 1 TABLET (20 MG TOTAL) BY MOUTH 2 (TWO) TIMES DAILY. TAKE '20MG'$  TWICE DAILY. GOAL IS A RESTING HEART RATE OF 55-60. 12/09/21   Daryel November, MD  SKYRIZI PEN 150 MG/ML SOAJ Inject into the skin. 08/28/21   [provider]    Physical Exam: Vitals:   07/04/22 1940 07/04/22 2311  BP: 138/83 126/69  Pulse: 78 76  Resp: 18 18  Temp: 99.6 F (37.6 C) 98.4 F (36.9 C)  SpO2: 97% 97%     Constitutional: NAD, calm  Eyes: PERTLA, lids and conjunctivae normal ENMT: Mucous membranes are moist. Posterior pharynx clear of any exudate or lesions.   Neck: supple, no masses  Respiratory: no wheezing, no crackles. No accessory muscle use.  Cardiovascular: S1 & S2 heard, regular rate and rhythm. No extremity edema.   Abdomen: No distension, no tenderness, soft. Bowel sounds active.  Musculoskeletal: no clubbing / cyanosis. No joint deformity upper and lower extremities.   Skin: no significant rashes, lesions, ulcers. Warm, dry, well-perfused. Neurologic: CN 2-12 grossly intact. Moving all extremities. Alert and oriented.  Psychiatric: Pleasant.  Cooperative.    Labs and Imaging on Admission: I have personally reviewed following labs and imaging studies  CBC: Recent Labs  Lab 07/04/22 2037  WBC 5.9  HGB 11.7*  HCT 36.2*  MCV 92.8  PLT 295*   Basic Metabolic Panel: Recent Labs  Lab 07/04/22 2037  NA 141  K 4.6  CL 106  CO2 27  GLUCOSE 93  BUN 16  CREATININE 0.80  CALCIUM 9.1   GFR: CrCl cannot be calculated (Unknown ideal weight.). Liver Function Tests: Recent Labs  Lab 07/04/22 2037  AST 73*  ALT 53*  ALKPHOS 120  BILITOT 2.7*  PROT 6.2*  ALBUMIN 2.6*   No results for input(s): "LIPASE", "AMYLASE" in the last 168 hours. No results for input(s): "AMMONIA" in the last 168 hours. Coagulation Profile: Recent Labs  Lab 07/04/22 2038  INR 1.5*   Cardiac Enzymes: No results for input(s): "CKTOTAL", "CKMB", "CKMBINDEX", "TROPONINI" in the last 168 hours. BNP (last 3 results) No results for input(s): "PROBNP" in the last 8760 hours. HbA1C: No results for input(s): "HGBA1C" in the last 72 hours. CBG: No results for input(s): "GLUCAP" in the last 168 hours. Lipid Profile: No results for input(s): "CHOL", "HDL", "LDLCALC", "TRIG", "CHOLHDL", "LDLDIRECT" in the last 72 hours. Thyroid Function Tests: No results for input(s): "TSH", "T4TOTAL", "FREET4", "T3FREE", "THYROIDAB" in the last 72 hours. Anemia Panel: No results for input(s): "VITAMINB12", "FOLATE", "FERRITIN", "TIBC", "IRON", "RETICCTPCT" in the last 72 hours. Urine analysis:    Component Value Date/Time   COLORURINE AMBER (A) 08/01/2021 2031   APPEARANCEUR CLEAR 08/01/2021 2031   LABSPEC 1.032 (H) 08/01/2021 2031   PHURINE 5.0 08/01/2021 2031   GLUCOSEU NEGATIVE 08/01/2021 2031   GLUCOSEU NEG mg/dL 10/12/2007 2022   HGBUR NEGATIVE 08/01/2021 2031   [place] NEGATIVE 08/01/2021 2031   [place] NEGATIVE 08/01/2021 2031   PROTEINUR 30 (A) 08/01/2021 2031   UROBILINOGEN 1 10/12/2007 2022   NITRITE NEGATIVE 08/01/2021 2031    LEUKOCYTESUR NEGATIVE 08/01/2021 2031   Sepsis Labs: '@LABRCNTIP'$ (procalcitonin:4,lacticidven:4) )No results found for this or any previous visit (from the past 240 hour(s)).   Radiological Exams on Admission: DG Chest 1 View  Result Date: 07/05/2022 CLINICAL DATA:  Hematemesis EXAM: CHEST  1 VIEW COMPARISON:  06/07/2013 FINDINGS: Lungs are clear.  No pleural effusion or pneumothorax. The heart is normal in size. IMPRESSION: No evidence of acute cardiopulmonary disease. Electronically Signed   By: Julian Hy M.D.   On: 07/05/2022 02:18     Assessment/Plan  1. Upper GI bleeding  - Pt with cirrhosis and hx of esophageal varices p/w hematemesis and melena  - He is hemodynamically stable in ED and initial Hgb is 11.7  - Continue IV PPI, start octreotide and Rocephin, follow serial H&H, consult GI  2. Cirrhosis  - MELD-Na is 15 on admission  - Hold beta-blocker initially given concern for upper GI bleed and resume if pressures remain stable, strict EtOH avoidance encouraged    DVT prophylaxis: SCDs  Code Status: Full  Level of Care: Level of care: Telemetry Family Communication: none present   Disposition Plan:  Patient is from: home  Anticipated d/c is to: home  Anticipated d/c date is: 07/07/22  Patient currently: Pending likely GI consultation, stable H&H  Consults called: Secure chat sent to Dr. Fuller Plan of [facility] with request for consultation   Admission status: Inpatient     Vianne Bulls, MD [facility]  07/05/2022, 2:43 AM

## 2022-07-05 NOTE — ED Provider Notes (Signed)
[facility] Provider Note  CSN: [id number] Arrival date & time: 07/04/22 1757  Chief Complaint(s) Hematemesis  HPI Thomas [name] is a 47 y.o. male    The history is provided by the patient.  Emesis Severity:  Moderate Duration: 8 hours. started at 4 am, last bout of emesis around 1pm. Number of daily episodes:  X3 Emesis appearance: dark maroon blood x2, then BRB x1. Progression:  Resolved Chronicity:  Recurrent Relieved by: self resolved. Worsened by:  Nothing Associated symptoms: abdominal pain (epigastric, nontender 'possibly hunger'), cough (for 1 week) and URI (for 1 week)   Risk factors comment:  H/o alcoholic cirrhosis with esoph varices  Reports melena for 2 days.   Past Medical History Past Medical History:  Diagnosis Date   Alcoholic fatty liver 7/61/6073   Needs final HBV and HAV vaccines on or after 10/25/2012    Alcoholism ([place]) 12/25/2011   Allergic rhinitis    Childhood asthma    Elevated transaminase level 2009   AST: 80 ALT: 136 in 8/11: Hepatitis A., B and C negative.    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    Sleep apnea    Patient Active Problem List   Diagnosis Date Noted   Need for Tdap vaccination 05/13/2022   Acute conjunctivitis of both eyes 10/22/2021   Pharyngitis 10/22/2021   Psoriatic arthritis ([place]) 10/03/2021   Viral upper respiratory tract infection 10/03/2021   Gastroenteritis 08/26/2021   Normocytic anemia 08/26/2021   Screening for tuberculosis 08/15/2021   Elevated LFTs 08/15/2021   Hospital discharge follow-up 08/12/2021   Macrocytic anemia 08/12/2021   Arthritis 06/14/2021   Elevated BP without diagnosis of hypertension 06/14/2021   Need for influenza vaccination 06/14/2021   Apnea 71/11/2692   Alcoholic cirrhosis of liver without ascites ([place]) 09/26/2019   Class 3 severe obesity due to excess calories with body mass index (BMI) of 50.0  to 59.9 in adult [facility]) 09/26/2019   Healthcare maintenance 02/07/2019   Psoriasis 02/07/2019   Vitamin D deficiency 05/07/2016   BMI 50.0-59.9, adult ([place]) 05/06/2016   Annual physical exam 05/06/2016   OSA (obstructive sleep apnea) 06/23/2013   Acute sinusitis 06/16/2013   Insomnia 06/16/2013   Alcoholism ([place]) 12/25/2011   Alcohol use 12/25/2011   Scrotal varices, left 85/46/2703   Alcoholic fatty liver 50/02/3817   Fatty liver 01/16/2010   Allergic rhinitis 10/12/2007   Home Medication(s) Prior to Admission medications   Medication Sig Start Date End Date Taking? Authorizing Provider  buPROPion (WELLBUTRIN XL) 150 MG 24 hr tablet Take 150 mg by mouth daily.    [provider]  Multiple Vitamin (MULTIVITAMIN) capsule Take 1 capsule by mouth daily.    [provider]  pantoprazole (PROTONIX) 40 MG tablet TAKE 1 TABLET BY MOUTH EVERY DAY 09/30/21   Daryel November, MD  propranolol (INDERAL) 20 MG tablet TAKE 1 TABLET (20 MG TOTAL) BY MOUTH 2 (TWO) TIMES DAILY. TAKE '20MG'$  TWICE DAILY. GOAL IS A RESTING HEART RATE OF 55-60. 12/09/21   Daryel November, MD  SKYRIZI PEN 150 MG/ML SOAJ Inject into the skin. 08/28/21   [provider]  Allergies Codeine  Review of Systems Review of Systems  Respiratory:  Positive for cough (for 1 week).   Gastrointestinal:  Positive for abdominal pain (epigastric, nontender 'possibly hunger') and vomiting.   As noted in HPI  Physical Exam Vital Signs  I have reviewed the triage vital signs BP 126/69   Pulse 76   Temp 98.4 F (36.9 C)   Resp 18   SpO2 97%   Physical Exam Vitals reviewed.  Constitutional:      General: He is not in acute distress.    Appearance: He is well-developed. He is obese. He is not diaphoretic.  HENT:     Head: Normocephalic and atraumatic.     Right Ear:  External ear normal.     Left Ear: External ear normal.     Nose: Nose normal.     Mouth/Throat:     Mouth: Mucous membranes are moist.  Eyes:     General: No scleral icterus.    Conjunctiva/sclera: Conjunctivae normal.  Neck:     Trachea: Phonation normal.  Cardiovascular:     Rate and Rhythm: Normal rate and regular rhythm.  Pulmonary:     Effort: Pulmonary effort is normal. No respiratory distress.     Breath sounds: No stridor.  Abdominal:     General: There is no distension.     Tenderness: There is no abdominal tenderness.  Musculoskeletal:        General: Normal range of motion.     Cervical back: Normal range of motion.  Neurological:     Mental Status: He is alert and oriented to person, place, and time.  Psychiatric:        Behavior: Behavior normal.     ED Results and Treatments Labs (all labs ordered are listed, but only abnormal results are displayed) Labs Reviewed  COMPREHENSIVE METABOLIC PANEL - Abnormal; Notable for the following components:      Result Value   Total Protein 6.2 (*)    Albumin 2.6 (*)    AST 73 (*)    ALT 53 (*)    Total Bilirubin 2.7 (*)    All other components within normal limits  CBC - Abnormal; Notable for the following components:   RBC 3.90 (*)    Hemoglobin 11.7 (*)    HCT 36.2 (*)    RDW 16.7 (*)    Platelets 133 (*)    All other components within normal limits  PROTIME-INR - Abnormal; Notable for the following components:   Prothrombin Time 17.7 (*)    INR 1.5 (*)    All other components within normal limits  LIPASE, BLOOD  POC OCCULT BLOOD, ED  TYPE AND SCREEN  ABO/RH                                                                                                                         EKG  EKG Interpretation  Date/Time:    Ventricular Rate:    PR Interval:  QRS Duration:   QT Interval:    QTC Calculation:   R Axis:     Text Interpretation:         Radiology No results found.  Medications Ordered  in ED Medications  pantoprazole (PROTONIX) injection 40 mg (has no administration in time range)                                                                                                                                     Procedures Procedures  (including critical care time)  Medical Decision Making / ED Course   Medical Decision Making Amount and/or Complexity of Data Reviewed Labs: ordered. Decision-making details documented in ED Course. Radiology: ordered and independent interpretation performed. Decision-making details documented in ED Course.  Risk Prescription drug management. Decision regarding hospitalization.    Patient presents with hematemesis and melena. History of alcoholic cirrhosis and variceal veins. Abdomen benign on exam  Will get labs to assess for severe anemia, metabolic/electrolyte derangements, fecal occult to confirm bleed.  Provided with IV Protonix  CBC without leukocytosis.  Mild anemia but stable hemoglobin. Metabolic panel without significant electrolyte derangements or renal sufficiency.  Mild transaminitis.  Mildly elevated bilirubin. Hemoccult was positive INR stable at 1.5  Chest x-ray without evidence of pneumonia, pneumothorax, pneumomediastinum concerning for esophageal perforation.  Case discussed with Dr. Myna Hidalgo from the hospitalist service who agreed to admit patient for further workup and management.  GI will be consulted in the morning for additional recommendations.        Final Clinical Impression(s) / ED Diagnoses Final diagnoses:  Hematemesis with nausea  Melena  Upper GI bleed           This chart was dictated using voice recognition software.  Despite best efforts to proofread,  errors can occur which can change the documentation meaning.    Fatima Blank, MD 07/05/22 [phone]

## 2022-07-05 NOTE — Anesthesia Preprocedure Evaluation (Addendum)
Anesthesia Evaluation  Patient identified by MRN, date of birth, ID band Patient awake    Reviewed: Allergy & Precautions, NPO status , Patient's Chart, lab work & pertinent test results  Airway Mallampati: II  TM Distance: >3 FB Neck ROM: Full    Dental no notable dental hx.    Pulmonary asthma (Childhood) , sleep apnea , Current Smoker and Patient abstained from smoking.   Pulmonary exam normal        Cardiovascular negative cardio ROS Normal cardiovascular exam     Neuro/Psych negative neurological ROS  negative psych ROS   GI/Hepatic ,,,(+) Cirrhosis   Esophageal Varices  substance abuse    Endo/Other    Morbid obesity  Renal/GU negative Renal ROS     Musculoskeletal  (+) Arthritis ,    Abdominal  (+) + obese  Peds  Hematology  (+) Blood dyscrasia (INR 1.5), anemia   Anesthesia Other Findings Hematemesis Cirrhosis Known varices  Reproductive/Obstetrics                             Anesthesia Physical Anesthesia Plan  ASA: 3  Anesthesia Plan: MAC   Post-op Pain Management:    Induction: Intravenous  PONV Risk Score and Plan: 0 and Propofol infusion and Treatment may vary due to age or medical condition  Airway Management Planned: Nasal Cannula  Additional Equipment:   Intra-op Plan:   Post-operative Plan:   Informed Consent: I have reviewed the patients History and Physical, chart, labs and discussed the procedure including the risks, benefits and alternatives for the proposed anesthesia with the patient or authorized representative who has indicated his/her understanding and acceptance.     Dental advisory given  Plan Discussed with: CRNA  Anesthesia Plan Comments:        Anesthesia Quick Evaluation

## 2022-07-06 DIAGNOSIS — K3189 Other diseases of stomach and duodenum: Secondary | ICD-10-CM

## 2022-07-06 DIAGNOSIS — K766 Portal hypertension: Secondary | ICD-10-CM

## 2022-07-06 LAB — COMPREHENSIVE METABOLIC PANEL
ALT: 53 U/L — ABNORMAL HIGH (ref 0–44)
AST: 110 U/L — ABNORMAL HIGH (ref 15–41)
Albumin: 2.3 g/dL — ABNORMAL LOW (ref 3.5–5.0)
Alkaline Phosphatase: 87 U/L (ref 38–126)
Anion gap: 5 (ref 5–15)
BUN: 13 mg/dL (ref 6–20)
CO2: 24 mmol/L (ref 22–32)
Calcium: 7.9 mg/dL — ABNORMAL LOW (ref 8.9–10.3)
Chloride: 110 mmol/L (ref 98–111)
Creatinine, Ser: 0.84 mg/dL (ref 0.61–1.24)
GFR, Estimated: >60 mL/min (ref >60)
Glucose, Bld: 81 mg/dL (ref 70–99)
Potassium: 3.9 mmol/L (ref 3.5–5.1)
Sodium: 139 mmol/L (ref 135–145)
Total Bilirubin: 2 mg/dL — ABNORMAL HIGH (ref 0.3–1.2)
Total Protein: 5.2 g/dL — ABNORMAL LOW (ref 6.5–8.1)

## 2022-07-06 LAB — CBC
HCT: 28.1 % — ABNORMAL LOW (ref 39.0–52.0)
Hemoglobin: 9.5 g/dL — ABNORMAL LOW (ref 13.0–17.0)
MCH: 30.8 pg (ref 26.0–34.0)
MCHC: 33.8 g/dL (ref 30.0–36.0)
MCV: 91.2 fL (ref 80.0–100.0)
Platelets: 113 10*3/uL — ABNORMAL LOW (ref 150–400)
RBC: 3.08 MIL/uL — ABNORMAL LOW (ref 4.22–5.81)
RDW: 16.4 % — ABNORMAL HIGH (ref 11.5–15.5)
WBC: 4.3 10*3/uL (ref 4.0–10.5)
nRBC: 0 % (ref 0.0–0.2)

## 2022-07-06 MED ORDER — PANTOPRAZOLE SODIUM 40 MG PO TBEC: 40.0000 mg | DELAYED_RELEASE_TABLET | Freq: Two times a day (BID) | ORAL | 0 refills | Status: DC

## 2022-07-06 MED ORDER — PROPRANOLOL HCL 20 MG PO TABS: 20.0000 mg | ORAL_TABLET | Freq: Two times a day (BID) | ORAL | 0 refills | Status: DC

## 2022-07-06 NOTE — Discharge Summary (Signed)
Physician Discharge Summary   Patient: Thomas [name] MRN: [id number] DOB: 1975-07-12  Admit date:     07/04/2022  Discharge date: 07/06/22  Discharge Physician: Marylu Lund   PCP: Libby Maw, MD   Recommendations at discharge:    Follow up with PCP in 2-3 weeks Follow up with [facility] as scheduled  Discharge Diagnoses: Principal Problem:   Acute upper GI bleed Active Problems:   Esophageal varices in alcoholic cirrhosis ([place])   Hematemesis with nausea   Melena  Resolved Problems:   * No resolved hospital problems. *  Hospital Course: 47 y.o. male with medical history significant for alcoholic cirrhosis and psoriatic arthritis who presents emergency department with hematemesis and melena. GI was consulted  Assessment and Plan: 1. Upper GI bleeding  - Pt with cirrhosis and hx of esophageal varices p/w hematemesis and melena  - Pt presented hemodynamically stable in ED and initial Hgb is 11.7  - Pt was initially continued on IV PPI and empiric octreotide -GI consulted and pt underwent EGD, reviewed. Findings of grade 2 esophageal varices with no bleeding and no stigmata of recent bleed, type 2 gastric varices without bleeding, gastritis with hemorrhage -GI recs to d/c octreotide, cont IV PPI, start selective BB, propranolol and titrate up to HR 50-60   2. Cirrhosis  - MELD-Na is 15 on admission  - LFT's remain stable        Consultants: [facility] Procedures performed: EGD 1/27  Disposition: Home Diet recommendation:  Regular diet DISCHARGE MEDICATION: Allergies as of 07/06/2022       Reactions   Codeine         Medication List     TAKE these medications    buPROPion 150 MG 24 hr tablet Commonly known as: WELLBUTRIN XL Take 150 mg by mouth daily.   multivitamin capsule Take 1 capsule by mouth daily.   pantoprazole 40 MG tablet Commonly known as: Protonix Take 1 tablet (40 mg total) by mouth 2 (two) times daily. What changed: when to  take this   propranolol 20 MG tablet Commonly known as: INDERAL Take 1 tablet (20 mg total) by mouth 2 (two) times daily. What changed: additional instructions   Skyrizi Pen 150 MG/ML Soaj Generic drug: Risankizumab-rzaa Inject into the skin.        Follow-up Information     Libby Maw, MD Follow up in 2 week(s).   Specialty: Family Medicine Why: Hospital follow up Contact information: [place] [address] [phone]         [facility] Follow up in 2 week(s).   Specialty: Gastroenterology Why: Hospital follow up, as scheduled Contact information: [place] [address] [phone]               Discharge Exam: Danley Danker Weights   07/05/22 0742  Weight: 128.4 kg   General exam: Awake, laying in bed, in nad Respiratory system: Normal respiratory effort, no wheezing Cardiovascular system: regular rate, s1, s2 Gastrointestinal system: Soft, nondistended, positive BS Central nervous system: CN2-12 grossly intact, strength intact Extremities: Perfused, no clubbing Skin: Normal skin turgor, no notable skin lesions seen Psychiatry: Mood normal // no visual hallucinations   Condition at discharge: fair  The results of significant diagnostics from this hospitalization (including imaging, microbiology, ancillary and laboratory) are listed below for reference.   Imaging Studies: DG Chest 1 View  Result Date: 07/05/2022 CLINICAL DATA:  Hematemesis EXAM: CHEST  1  VIEW COMPARISON:  06/07/2013 FINDINGS: Lungs are clear.  No pleural effusion or pneumothorax. The heart is normal in size. IMPRESSION: No evidence of acute cardiopulmonary disease. Electronically Signed   By: Julian Hy M.D.   On: 07/05/2022 02:18    Microbiology: Results for orders placed or performed in visit on 08/27/21  Ova and parasite examination     Status: None   Collection Time: 08/27/21  9:18  AM   Specimen: Stool  Result Value Ref Range Status   MICRO NUMBER: [id number]  Final   SPECIMEN QUALITY: Adequate  Final   Source STOOL  Final   STATUS: FINAL  Final   CONCENTRATE RESULT: No ova or parasites seen  Final   TRICHROME RESULT: No ova or parasites seen  Final   COMMENT:   Final    Routine Ova and Parasite exam may not detect some parasites that occasionally cause diarrheal illness. Cryptosporidium Antigen and/or Cyclospora and Isospora Exam may be ordered to detect these parasites. One negative sample does not necessarily rule out  the presence of a parasitic infection.  For additional information, please refer to https://education.[facility].com/faq/FAQ203 (This link is being provided for informational/ educational purposes only.)   Stool culture     Status: None   Collection Time: 08/27/21  9:18 AM   Stool  Result Value Ref Range Status   Salmonella/Shigella Screen Final report  Final   Stool Culture result 1 (RSASHR) Comment  Final    Comment: No Salmonella or Shigella recovered.   Campylobacter Culture Final report  Final   Stool Culture result 1 (CMPCXR) Comment  Final    Comment: No Campylobacter species isolated.   E coli, Shiga toxin Assay Negative Negative Final    Labs: CBC: Recent Labs  Lab 07/04/22 2037 07/05/22 0400 07/05/22 0826 07/05/22 1442 07/06/22 0409  WBC 5.9  --   --   --  4.3  HGB 11.7* 11.2* 9.6* 9.9* 9.5*  HCT 36.2* 32.9* 30.0* 28.9* 28.1*  MCV 92.8  --   --   --  91.2  PLT 133*  --   --   --  102*   Basic Metabolic Panel: Recent Labs  Lab 07/04/22 2037 07/05/22 0400 07/06/22 0409  NA 141 139 139  K 4.6 3.9 3.9  CL 106 108 110  CO2 '27 25 24  '$ GLUCOSE 93 84 81  BUN '16 18 13  '$ CREATININE 0.80 0.76 0.84  CALCIUM 9.1 8.5* 7.9*  MG  --  1.7  --   PHOS  --  3.1  --    Liver Function Tests: Recent Labs  Lab 07/04/22 2037 07/05/22 0400 07/06/22 0409  AST 73* 71* 110*  ALT 53* 48* 53*  ALKPHOS 120 111 87  BILITOT 2.7*  2.7* 2.0*  PROT 6.2* 5.6* 5.2*  ALBUMIN 2.6* 2.5* 2.3*   CBG: No results for input(s): "GLUCAP" in the last 168 hours.  Discharge time spent: less than 30 minutes.  Signed: Marylu Lund, MD [facility] 07/06/2022

## 2022-07-06 NOTE — Progress Notes (Signed)
[facility] ROUNDING NOTE   Subjective: Feels well, denies any nausea or abdominal pain He has not had a bowel movement yet   Objective: Vital signs in last 24 hours: Temp:  [97.6 F (36.4 C)] 97.6 F (36.4 C) (01/28 0853) Pulse Rate:  [59-85] 72 (01/28 1011) Resp:  [16-20] 20 (01/28 1011) BP: (95-121)/(51-76) 108/65 (01/28 1011) SpO2:  [91 %-99 %] 99 % (01/28 1011)   General: NAD Abdomen: Distended, no tenderness soft  Intake/Output from previous day: 01/27 0701 - 01/28 0700 In: 2499.6 [I.V.:2399.6; IV Piggyback:100] Out: -  Intake/Output this shift: No intake/output data recorded.   Lab Results: Recent Labs    07/04/22 2037 07/05/22 0400 07/05/22 0826 07/05/22 1442 07/06/22 0409  WBC 5.9  --   --   --  4.3  HGB 11.7*   < > 9.6* 9.9* 9.5*  PLT 133*  --   --   --  113*  MCV 92.8  --   --   --  91.2   < > = values in this interval not displayed.   BMET Recent Labs    07/04/22 2037 07/05/22 0400 07/06/22 0409  NA 141 139 139  K 4.6 3.9 3.9  CL 106 108 110  CO2 '27 25 24  '$ GLUCOSE 93 84 81  BUN '16 18 13  '$ CREATININE 0.80 0.76 0.84  CALCIUM 9.1 8.5* 7.9*   LFT Recent Labs    07/04/22 2037 07/05/22 0400 07/06/22 0409  PROT 6.2* 5.6* 5.2*  ALBUMIN 2.6* 2.5* 2.3*  AST 73* 71* 110*  ALT 53* 48* 53*  ALKPHOS 120 111 87  BILITOT 2.7* 2.7* 2.0*   PT/INR Recent Labs    07/04/22 2038  INR 1.5*      Imaging/Other results: DG Chest 1 View  Result Date: 07/05/2022 CLINICAL DATA:  Hematemesis EXAM: CHEST  1 VIEW COMPARISON:  06/07/2013 FINDINGS: Lungs are clear.  No pleural effusion or pneumothorax. The heart is normal in size. IMPRESSION: No evidence of acute cardiopulmonary disease. Electronically Signed   By: Julian Hy M.D.   On: 07/05/2022 02:18      Assessment &Plan  47 year old very pleasant gentleman with history of Psoriatec arthritis, decompensated alcoholic cirrhosis with ascites, portal hypertensive gastropathy and  varices  MELD 3.0: 15 at 07/06/2022  4:09 AM MELD-Na: 14 at 07/06/2022  4:09 AM  LFT pattern is consistent with ongoing alcohol use, discussed alcohol cessation in detail with patient  S/p EGD yesterday with moderate portal hypertensive gastropathy, isolated gastric varices type II and esophageal varices, no active sign of variceal hemorrhage Hemoglobin trended down slightly with IV fluids, is stable in the last 24 hours Continue pantoprazole 40 mg twice daily Propranolol 20 mg twice daily, titrate up to goal heart rate 50-60 Continue soft diet and advance as tolerated DC antibiotics on discharge home Can consider DC home later this afternoon if has no further signs of bleeding  Will arrange for GI outpatient follow-up in 2 to 3 weeks    K. Denzil Magnuson , MD [phone]  [facility]

## 2022-07-07 ENCOUNTER — Telehealth: Payer: Self-pay | Admitting: Family Medicine

## 2022-07-07 ENCOUNTER — Encounter (HOSPITAL_COMMUNITY): Payer: Self-pay | Admitting: Gastroenterology

## 2022-07-07 ENCOUNTER — Ambulatory Visit: Payer: BC Managed Care – PPO | Admitting: Family Medicine

## 2022-07-07 ENCOUNTER — Telehealth: Payer: Self-pay

## 2022-07-07 NOTE — Telephone Encounter (Signed)
Pt was a no show for an OV with Dr Ethelene Hal, I sent a letter.

## 2022-07-07 NOTE — Telephone Encounter (Signed)
Transition Care Management Unsuccessful Follow-up Telephone Call  Date of discharge and from where:  [place] 07-06-22 Dx: Hematemesis with nausea  Attempts:  1st Attempt  Reason for unsuccessful TCM follow-up call:  Left voice message  Pt has appt today with Dr Ethelene Hal 07-07-22 at 1040am

## 2022-07-07 NOTE — Telephone Encounter (Signed)
Transition Care Management Unsuccessful Follow-up Telephone Call  Date of discharge and from where:  07/06/22 [facility] ED. Dx: Hematemesis w/nausea  Attempts:  2nd Attempt  Reason for unsuccessful TCM follow-up call:  Left voice message

## 2022-07-22 ENCOUNTER — Encounter: Payer: Self-pay | Admitting: Family Medicine

## 2022-07-23 ENCOUNTER — Other Ambulatory Visit: Payer: Self-pay

## 2022-07-23 DIAGNOSIS — K529 Noninfective gastroenteritis and colitis, unspecified: Secondary | ICD-10-CM

## 2022-07-23 MED ORDER — PANTOPRAZOLE SODIUM 40 MG PO TBEC: 40.0000 mg | DELAYED_RELEASE_TABLET | Freq: Two times a day (BID) | ORAL | 0 refills | Status: DC

## 2022-07-28 ENCOUNTER — Encounter: Payer: Self-pay | Admitting: Family Medicine

## 2022-07-28 ENCOUNTER — Ambulatory Visit (INDEPENDENT_AMBULATORY_CARE_PROVIDER_SITE_OTHER): Payer: BC Managed Care – PPO | Admitting: Family Medicine

## 2022-07-28 ENCOUNTER — Ambulatory Visit (INDEPENDENT_AMBULATORY_CARE_PROVIDER_SITE_OTHER)
Admission: RE | Admit: 2022-07-28 | Discharge: 2022-07-28 | Disposition: A | Discharge status: Home / Self Care | Payer: BC Managed Care – PPO | Source: Ambulatory Visit | Attending: Family Medicine | Admitting: Family Medicine

## 2022-07-28 VITALS — BP 108/66 | HR 62 | Temp 98.3°F | Ht 65.0 in | Wt 275.0 lb

## 2022-07-28 DIAGNOSIS — M79642 Pain in left hand: Secondary | ICD-10-CM | POA: Diagnosis not present

## 2022-07-28 DIAGNOSIS — J4530 Mild persistent asthma, uncomplicated: Secondary | ICD-10-CM | POA: Diagnosis not present

## 2022-07-28 DIAGNOSIS — J45909 Unspecified asthma, uncomplicated: Secondary | ICD-10-CM | POA: Insufficient documentation

## 2022-07-28 HISTORY — DX: Pain in left hand: M79.642

## 2022-07-28 MED ORDER — PREDNISONE 20 MG PO TABS: 20.0000 mg | ORAL_TABLET | Freq: Two times a day (BID) | ORAL | 0 refills | Status: AC

## 2022-07-28 NOTE — Progress Notes (Signed)
Established Patient Office Visit   Subjective:  Patient ID: Thomas [name], male    DOB: 09-02-1975  Age: 47 y.o. MRN: [id number]  Chief Complaint  Patient presents with   Cough    Cough will not go away, check left knuckle pain, abdominal pains that come and go.      Cough Associated symptoms include wheezing. Pertinent negatives include no eye redness, hemoptysis, myalgias, rash or shortness of breath.   Encounter Diagnoses  Name Primary?   Mild persistent reactive airway disease without complication Yes   Hand pain, left    Ongoing cough productive of white phlegm status post URI for the last 4 to 6 weeks.  There is been no fevers or chills.  And tightness in the chest.  History of asthma as a child.  He smokes cigars on occasion.  There has been pain in the second knuckle of his left hand over the last 6 weeks.  No injury.  He is right-hand dominant.  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   Review of Systems  Constitutional: Negative.   HENT: Negative.    Eyes:  Negative for blurred vision, discharge and redness.  Respiratory:  Positive for cough, sputum production and wheezing. Negative for hemoptysis and shortness of breath.   Cardiovascular: Negative.   Gastrointestinal:  Negative for abdominal pain.  Genitourinary: Negative.   Musculoskeletal: Negative.  Negative for myalgias.  Skin:  Negative for rash.  Neurological:  Negative for tingling, loss of consciousness and weakness.  Endo/Heme/Allergies:  Negative for polydipsia.     Current Outpatient Medications:    buPROPion (WELLBUTRIN XL) 150 MG 24 hr tablet, Take 150 mg by mouth daily., Disp: , Rfl:    Multiple Vitamin (MULTIVITAMIN) capsule, Take 1 capsule by mouth daily., Disp: , Rfl:    pantoprazole (PROTONIX) 40 MG tablet, Take 1 tablet (40 mg total) by mouth 2 (two) times daily., Disp: 180 tablet, Rfl: 0   predniSONE (DELTASONE) 20 MG tablet, Take 1 tablet (20 mg total) by mouth 2 (two) times daily with a meal for 7  days., Disp: 14 tablet, Rfl: 0   propranolol (INDERAL) 20 MG tablet, Take 1 tablet (20 mg total) by mouth 2 (two) times daily., Disp: 60 tablet, Rfl: 0   SKYRIZI PEN 150 MG/ML SOAJ, Inject into the skin., Disp: , Rfl:    Objective:     BP 108/66 (BP Location: Left Arm, Patient Position: Sitting, Cuff Size: Large)   Pulse 62   Temp 98.3 F (36.8 C) (Temporal)   Ht 5' 5"$  (1.651 m)   Wt 275 lb (124.7 kg)   SpO2 98%   BMI 45.76 kg/m    Physical Exam Constitutional:      General: He is not in acute distress.    Appearance: Normal appearance. He is not ill-appearing, toxic-appearing or diaphoretic.  HENT:     Head: Normocephalic and atraumatic.     Right Ear: External ear normal.     Left Ear: External ear normal.  Eyes:     General: No scleral icterus.       Right eye: No discharge.        Left eye: No discharge.     Extraocular Movements: Extraocular movements intact.     Conjunctiva/sclera: Conjunctivae normal.     Pupils: Pupils are equal, round, and reactive to light.  Cardiovascular:     Rate and Rhythm: Normal rate and regular rhythm.  Pulmonary:     Effort: Pulmonary effort is  normal. No respiratory distress.     Breath sounds: Wheezing present. No rales.  Musculoskeletal:     Left hand: No swelling, tenderness or bony tenderness. Normal range of motion. Normal strength.     Cervical back: No rigidity or tenderness.  Skin:    General: Skin is warm and dry.  Neurological:     Mental Status: He is alert and oriented to person, place, and time.  Psychiatric:        Mood and Affect: Mood normal.        Behavior: Behavior normal.      No results found for any visits on 07/28/22.    The 10-year ASCVD risk score (Arnett DK, et al., 2019) is: 2.3%    Assessment & Plan:   Mild persistent reactive airway disease without complication -     predniSONE; Take 1 tablet (20 mg total) by mouth 2 (two) times daily with a meal for 7 days.  Dispense: 14 tablet; Refill:  0  Hand pain, left -     DG Hand Complete Left; Future    Return if symptoms worsen or fail to improve.    Libby Maw, MD

## 2022-08-14 ENCOUNTER — Ambulatory Visit (INDEPENDENT_AMBULATORY_CARE_PROVIDER_SITE_OTHER): Payer: BC Managed Care – PPO | Admitting: Family Medicine

## 2022-08-14 ENCOUNTER — Encounter: Payer: Self-pay | Admitting: Family Medicine

## 2022-08-14 VITALS — BP 130/68 | HR 65 | Temp 98.7°F | Ht 65.0 in | Wt 270.6 lb

## 2022-08-14 DIAGNOSIS — H00011 Hordeolum externum right upper eyelid: Secondary | ICD-10-CM

## 2022-08-14 HISTORY — DX: Hordeolum externum right upper eyelid: H00.011

## 2022-08-14 MED ORDER — ERYTHROMYCIN 5 MG/GM OP OINT: TOPICAL_OINTMENT | OPHTHALMIC | 0 refills | Status: DC

## 2022-08-14 NOTE — Progress Notes (Signed)
Established Patient Office Visit   Subjective:  Patient ID: Thomas [name], male    DOB: Sep 01, 1975  Age: 47 y.o. MRN: [id number]  Chief Complaint  Patient presents with   Eye Problem    C/o having red irritated RT eye x 2 days and neck/swelling x 3-4 days.   Has been taking Sudafed and other OTC allergy meds.     Eye Problem  Pertinent negatives include no blurred vision, eye discharge, eye redness, tingling or weakness.   Encounter Diagnoses  Name Primary?   Hordeolum externum of right upper eyelid Yes   Developed a tender swelling in the right upper eyelid over the last 2 days.  Status post URI that is resolving.  Vision is not affected.   Review of Systems  Constitutional: Negative.   HENT: Negative.    Eyes:  Negative for blurred vision, discharge and redness.  Respiratory: Negative.    Cardiovascular: Negative.   Gastrointestinal:  Negative for abdominal pain.  Genitourinary: Negative.   Musculoskeletal: Negative.  Negative for myalgias.  Skin:  Negative for rash.  Neurological:  Negative for tingling, loss of consciousness and weakness.  Endo/Heme/Allergies:  Negative for polydipsia.     Current Outpatient Medications:    buPROPion (WELLBUTRIN XL) 150 MG 24 hr tablet, Take 150 mg by mouth daily., Disp: , Rfl:    erythromycin ophthalmic ointment, Massage 1 quarter inch strip into right upper eyelid margin 3 times daily., Disp: 3.5 g, Rfl: 0   Multiple Vitamin (MULTIVITAMIN) capsule, Take 1 capsule by mouth daily., Disp: , Rfl:    pantoprazole (PROTONIX) 40 MG tablet, Take 1 tablet (40 mg total) by mouth 2 (two) times daily., Disp: 180 tablet, Rfl: 0   SKYRIZI PEN 150 MG/ML SOAJ, Inject into the skin., Disp: , Rfl:    propranolol (INDERAL) 20 MG tablet, Take 1 tablet (20 mg total) by mouth 2 (two) times daily., Disp: 60 tablet, Rfl: 0   Objective:     BP 130/68   Pulse 65   Temp 98.7 F (37.1 C) (Temporal)   Ht '5\' 5"'$  (1.651 m)   Wt 270 lb 9.6 oz (122.7 kg)    SpO2 95%   BMI 45.03 kg/m    Physical Exam Constitutional:      General: He is not in acute distress.    Appearance: Normal appearance. He is not ill-appearing, toxic-appearing or diaphoretic.  HENT:     Head: Normocephalic and atraumatic.     Right Ear: External ear normal.     Left Ear: External ear normal.  Eyes:     General: No scleral icterus.       Right eye: No discharge.        Left eye: No discharge.     Extraocular Movements: Extraocular movements intact.     Conjunctiva/sclera: Conjunctivae normal.     Pupils: Pupils are equal, round, and reactive to light.     Comments: Right upper lid is swollen with mild erythema.  No pustule was visualized on the lid margin or on the conjunctiva of the right upper lid.   Cardiovascular:     Rate and Rhythm: Normal rate and regular rhythm.  Pulmonary:     Effort: Pulmonary effort is normal. No respiratory distress.     Breath sounds: No stridor.  Musculoskeletal:     Cervical back: No rigidity or tenderness.  Lymphadenopathy:     Cervical: No cervical adenopathy.  Skin:    General: Skin is warm and dry.  Neurological:     Mental Status: He is alert and oriented to person, place, and time.  Psychiatric:        Mood and Affect: Mood normal.        Behavior: Behavior normal.      No results found for any visits on 08/14/22.    The 10-year ASCVD risk score (Arnett DK, et al., 2019) is: 3.4%    Assessment & Plan:   Hordeolum externum of right upper eyelid -     Erythromycin; Massage 1 quarter inch strip into right upper eyelid margin 3 times daily.  Dispense: 3.5 g; Refill: 0    Return Warm compresses to right upper eyelid 3 times daily.  Return if not improving in 1 week..  Information was given on hordeolum.   Libby Maw, MD

## 2022-08-16 ENCOUNTER — Emergency Department (HOSPITAL_BASED_OUTPATIENT_CLINIC_OR_DEPARTMENT_OTHER): Payer: BC Managed Care – PPO

## 2022-08-16 ENCOUNTER — Ambulatory Visit
Admission: EM | Admit: 2022-08-16 | Discharge: 2022-08-16 | Disposition: A | Discharge status: Home / Self Care | Payer: BC Managed Care – PPO

## 2022-08-16 ENCOUNTER — Other Ambulatory Visit: Payer: Self-pay

## 2022-08-16 ENCOUNTER — Emergency Department (HOSPITAL_BASED_OUTPATIENT_CLINIC_OR_DEPARTMENT_OTHER)
Admission: EM | Admit: 2022-08-16 | Discharge: 2022-08-16 | Disposition: A | Discharge status: Home / Self Care | Payer: BC Managed Care – PPO | Attending: Emergency Medicine | Admitting: Emergency Medicine

## 2022-08-16 ENCOUNTER — Encounter (HOSPITAL_BASED_OUTPATIENT_CLINIC_OR_DEPARTMENT_OTHER): Payer: Self-pay

## 2022-08-16 DIAGNOSIS — L03213 Periorbital cellulitis: Secondary | ICD-10-CM

## 2022-08-16 DIAGNOSIS — R220 Localized swelling, mass and lump, head: Secondary | ICD-10-CM

## 2022-08-16 DIAGNOSIS — L03211 Cellulitis of face: Secondary | ICD-10-CM | POA: Diagnosis not present

## 2022-08-16 LAB — BASIC METABOLIC PANEL
Anion gap: 6 (ref 5–15)
BUN: 7 mg/dL (ref 6–20)
CO2: 27 mmol/L (ref 22–32)
Calcium: 9 mg/dL (ref 8.9–10.3)
Chloride: 103 mmol/L (ref 98–111)
Creatinine, Ser: 0.69 mg/dL (ref 0.61–1.24)
GFR, Estimated: >60 mL/min (ref >60)
Glucose, Bld: 126 mg/dL — ABNORMAL HIGH (ref 70–99)
Potassium: 4 mmol/L (ref 3.5–5.1)
Sodium: 136 mmol/L (ref 135–145)

## 2022-08-16 LAB — CBC
HCT: 34.8 % — ABNORMAL LOW (ref 39.0–52.0)
Hemoglobin: 11.1 g/dL — ABNORMAL LOW (ref 13.0–17.0)
MCH: 26.9 pg (ref 26.0–34.0)
MCHC: 31.9 g/dL (ref 30.0–36.0)
MCV: 84.5 fL (ref 80.0–100.0)
Platelets: 148 10*3/uL — ABNORMAL LOW (ref 150–400)
RBC: 4.12 MIL/uL — ABNORMAL LOW (ref 4.22–5.81)
RDW: 17.3 % — ABNORMAL HIGH (ref 11.5–15.5)
WBC: 8.6 10*3/uL (ref 4.0–10.5)
nRBC: 0 % (ref 0.0–0.2)

## 2022-08-16 MED ORDER — SODIUM CHLORIDE 0.9 % IV SOLN
3.0000 g | Freq: Once | INTRAVENOUS | Status: AC
  Administered 2022-08-16: 3 g via INTRAVENOUS

## 2022-08-16 MED ORDER — IOHEXOL 300 MG/ML  SOLN
100.0000 mL | Freq: Once | INTRAMUSCULAR | Status: AC | PRN
  Administered 2022-08-16: 75 mL via INTRAVENOUS

## 2022-08-16 MED ORDER — AMOXICILLIN-POT CLAVULANATE 875-125 MG PO TABS: 1.0000 | ORAL_TABLET | Freq: Two times a day (BID) | ORAL | 0 refills | Status: DC

## 2022-08-16 MED ORDER — KETOROLAC TROMETHAMINE 15 MG/ML IJ SOLN
15.0000 mg | Freq: Once | INTRAMUSCULAR | Status: AC
  Administered 2022-08-16: 15 mg via INTRAVENOUS
  Filled 2022-08-16: qty 1

## 2022-08-16 MED ORDER — ACETAMINOPHEN 325 MG PO TABS
650.0000 mg | ORAL_TABLET | Freq: Once | ORAL | Status: AC
  Administered 2022-08-16: 650 mg via ORAL
  Filled 2022-08-16: qty 2

## 2022-08-16 NOTE — ED Notes (Signed)
RN reviewed discharge instructions with pt. Pt verbalized understanding and had no further questions. VSS upon discharge.  

## 2022-08-16 NOTE — ED Notes (Signed)
Patient is being discharged from the Urgent Care and sent to the Emergency Department via POV . Per Rosario Adie, patient is in need of higher level of care due to preseptal cellulitis of right lower eyelid. Patient is aware and verbalizes understanding of plan of care.  Vitals:   08/16/22 1516  BP: 120/80  Pulse: 86  Resp: 18  Temp: (!) 102 F (38.9 C)  SpO2: 95%

## 2022-08-16 NOTE — Discharge Instructions (Signed)
Go to the emergency room for further evaluation of your symptoms 

## 2022-08-16 NOTE — ED Provider Notes (Signed)
[facility]    CSN: [id number] Arrival date & time: 08/16/22  1434      History   Chief Complaint Chief Complaint  Patient presents with   Eye Problem    HPI Thomas [name] is a 47 y.o. male presents for evaluation of facial swelling and pain.  Patient reports 5 days ago he was treated by his PCP for a stye on his right upper eyelid.  He has been using antibiotic ointment to that area.  He states he also had some sinus symptoms that resolved.  He awoke today with pain and swelling under his right eye that extends to his nose.  He states area is very tender to touch.  Denies any documented fevers but endorses chills.  Denies any sinus pressure or pain.  Denies history of cellulitis or MRSA.  He does have a history of alcoholism.  No OTC medications have been used.  No other concerns at this time.   Eye Problem   Past Medical History:  Diagnosis Date   Alcoholic fatty liver [id number]   Needs final HBV and HAV vaccines on or after 10/25/2012    Alcoholism ([place]) 12/25/2011   Allergic rhinitis    Childhood asthma    Elevated transaminase level 06/10/2007   AST: 80 ALT: 136 in 8/11: Hepatitis A., B and C negative.    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    Sleep apnea     Patient Active Problem List   Diagnosis Date Noted   Hordeolum externum of right upper eyelid 08/14/2022   Reactive airway disease 07/28/2022   Hand pain, left 07/28/2022   Acute upper GI bleed 07/05/2022   Hematemesis with nausea 07/05/2022   Melena 07/05/2022   Need for Tdap vaccination 05/13/2022   Acute conjunctivitis of both eyes 10/22/2021   Pharyngitis 10/22/2021   Psoriatic arthritis ([place]) 10/03/2021   Viral upper respiratory tract infection 10/03/2021   Gastroenteritis 08/26/2021   Normocytic anemia 08/26/2021   Screening for tuberculosis 08/15/2021   Elevated LFTs 08/15/2021   Hospital discharge follow-up 08/12/2021   Macrocytic  anemia 08/12/2021   Arthritis 06/14/2021   Elevated BP without diagnosis of hypertension 06/14/2021   Need for influenza vaccination 06/14/2021   Apnea 09/26/2019   Esophageal varices in alcoholic cirrhosis ([place]) [id number]   Class 3 severe obesity due to excess calories with body mass index (BMI) of 50.0 to 59.9 in adult [facility]) 09/26/2019   Healthcare maintenance 02/07/2019   Psoriasis 02/07/2019   Vitamin D deficiency 05/07/2016   BMI 50.0-59.9, adult ([place]) 05/06/2016   Annual physical exam 05/06/2016   OSA (obstructive sleep apnea) 06/23/2013   Acute sinusitis 06/16/2013   Insomnia 06/16/2013   Alcoholism ([place]) 12/25/2011   Alcohol use 12/25/2011   Scrotal varices, left [id number]   Alcoholic fatty liver [id number]   Fatty liver 01/16/2010   Allergic rhinitis 10/12/2007    Past Surgical History:  Procedure Laterality Date   ESOPHAGOGASTRODUODENOSCOPY (EGD) WITH PROPOFOL N/A 07/05/2022   Procedure: ESOPHAGOGASTRODUODENOSCOPY (EGD) WITH PROPOFOL;  Surgeon: Mauri Pole, MD;  Location: [facility];  Service: Gastroenterology;  Laterality: N/A;       Home Medications    Prior to Admission medications   Medication Sig Start Date End Date Taking? Authorizing Provider  buPROPion (WELLBUTRIN XL) 150 MG 24 hr tablet Take 150 mg by mouth daily.    [provider]  erythromycin ophthalmic ointment Massage 1 quarter inch strip  into right upper eyelid margin 3 times daily. 08/14/22   Libby Maw, MD  Multiple Vitamin (MULTIVITAMIN) capsule Take 1 capsule by mouth daily.    [provider]  pantoprazole (PROTONIX) 40 MG tablet Take 1 tablet (40 mg total) by mouth 2 (two) times daily. 07/23/22   Libby Maw, MD  propranolol (INDERAL) 20 MG tablet Take 1 tablet (20 mg total) by mouth 2 (two) times daily. 07/06/22 08/05/22  Donne Hazel, MD  SKYRIZI PEN 150 MG/ML SOAJ Inject into the skin. 08/28/21   [provider]    Family  History Family History  Problem Relation Age of Onset   Liver disease Mother    Depression Mother    Liver disease Father    Diabetes Father    Hypertension Father    Liver disease Sister    Diabetes Other    Colon cancer Neg Hx    Esophageal cancer Neg Hx    Stomach cancer Neg Hx    Colon polyps Neg Hx     Social History Social History   Tobacco Use   Smoking status: Some Days    Types: Cigars   Smokeless tobacco: Never   Tobacco comments:    Cigars occasional  Vaping Use   Vaping Use: Never used  Substance Use Topics   Alcohol use: Yes    Comment: Beer/wine 1 - 3 times a week (drinks heavily on weekends)   Drug use: Never    Comment: history of cocaine abuse     Allergies   Codeine   Review of Systems Review of Systems  HENT:         Infection of skin around right eye     Physical Exam Triage Vital Signs ED Triage Vitals  Enc Vitals Group     BP 08/16/22 1516 120/80     Pulse Rate 08/16/22 1516 86     Resp 08/16/22 1516 18     Temp 08/16/22 1516 (!) 102 F (38.9 C)     Temp Source 08/16/22 1516 Oral     SpO2 08/16/22 1516 95 %     Weight --      Height --      Head Circumference --      Peak Flow --      Pain Score 08/16/22 1514 0     Pain Loc --      Pain Edu? --      Excl. in [place]? --    No data found.  Updated Vital Signs BP 120/80 (BP Location: Left Arm)   Pulse 86   Temp (!) 102 F (38.9 C) (Oral)   Resp 18   SpO2 95%   Visual Acuity Right Eye Distance:   Left Eye Distance:   Bilateral Distance:    Right Eye Near:   Left Eye Near:    Bilateral Near:     Physical Exam Vitals and nursing note reviewed.  Constitutional:      Appearance: Normal appearance.  HENT:     Head: Normocephalic and atraumatic.   Eyes:     Pupils: Pupils are equal, round, and reactive to light.  Cardiovascular:     Rate and Rhythm: Normal rate.  Pulmonary:     Effort: Pulmonary effort is normal.  Skin:    General: Skin is warm and dry.   Neurological:     General: No focal deficit present.     Mental Status: He is alert and oriented to person,  place, and time.  Psychiatric:        Mood and Affect: Mood normal.        Behavior: Behavior normal.      UC Treatments / Results  Labs (all labs ordered are listed, but only abnormal results are displayed) Labs Reviewed - No data to display  EKG   Radiology No results found.  Procedures Procedures (including critical care time)  Medications Ordered in UC Medications - No data to display  Initial Impression / Assessment and Plan / UC Course  I have reviewed the triage vital signs and the nursing notes.  Pertinent labs & imaging results that were available during my care of the patient were reviewed by me and considered in my medical decision making (see chart for details).     Patient with facial cellulitis/preseptal cellulitis with fever of 102 in clinic.  Discussed in length with patient.  Advise ER evaluation for further treatment and possible IV antibiotics.  He is in agreement with plan and will go POV to the emergency room.  He was instructed to pull over, and 1 1 for any worsening symptoms that occur in transit and he verbalized understanding Final Clinical Impressions(s) / UC Diagnoses   Final diagnoses:  Preseptal cellulitis of right lower eyelid     Discharge Instructions      Go to the emergency room for further evaluation of your symptoms   ED Prescriptions   None    PDMP not reviewed this encounter.   Melynda Ripple, NP 08/16/22 [phone]

## 2022-08-16 NOTE — ED Triage Notes (Signed)
Pt c/o inflammation/ tenderness to bridge of nose. The patient reports he has a stye to the right side of his eye, he has been applying prescribed cream to the eye.    Started: Monday

## 2022-08-16 NOTE — ED Triage Notes (Signed)
Patient here POV from Home.  Endorses Tenderness to Face/Nose Area that began yesterday and then today had notable Facial Swelling and Redness.   102 Temp at UC today. Sent by UC for Assessment.   NAD Noted during Triage. A&Ox4. GCS 15. Ambulatory.

## 2022-08-16 NOTE — ED Provider Notes (Signed)
[facility] Provider Note   CSN: [id number] Arrival date & time: 08/16/22  1620     History Chief Complaint  Patient presents with   Facial Swelling    HPI Thomas [name] is a 47 y.o. male presenting for severe facial swelling.  States Thomas has a history of recurrent sinusitis otherwise no medical problems.  States Thomas had severe sinusitis earlier this week for approximately 8 days now before increased greenish discharge and bilateral nasal turbinate and maxillary arch swelling started today.  Denies any injury or trauma.  Endorses fever 102 at home.  Denies any headache.  Thomas is otherwise ambulatory tolerating p.o. intake.  No known sick contacts..   Patient's recorded medical, surgical, social, medication list and allergies were reviewed in the Snapshot window as part of the initial history.   Review of Systems   Review of Systems  Constitutional:  Positive for fever. Negative for chills.  HENT:  Positive for facial swelling. Negative for ear pain and sore throat.   Eyes:  Negative for pain and visual disturbance.  Respiratory:  Negative for cough and shortness of breath.   Cardiovascular:  Negative for chest pain and palpitations.  Gastrointestinal:  Negative for abdominal pain and vomiting.  Genitourinary:  Negative for dysuria and hematuria.  Musculoskeletal:  Negative for arthralgias and back pain.  Skin:  Negative for color change and rash.  Neurological:  Negative for seizures and syncope.  All other systems reviewed and are negative.   Physical Exam Updated Vital Signs BP (!) 151/87   Pulse 94   Temp (!) 100.7 F (38.2 C) (Oral)   Resp 17   Ht '5\' 5"'$  (1.651 m)   Wt 122.7 kg   SpO2 98%   BMI 45.01 kg/m  Physical Exam Vitals and nursing note reviewed.  Constitutional:      General: Thomas is not in acute distress.    Appearance: Thomas is well-developed.  HENT:     Head: Normocephalic and atraumatic.      Comments:  Substantial swelling in the area labeled above Eyes:     Conjunctiva/sclera: Conjunctivae normal.  Cardiovascular:     Rate and Rhythm: Normal rate and regular rhythm.     Heart sounds: No murmur heard. Pulmonary:     Effort: Pulmonary effort is normal. No respiratory distress.     Breath sounds: Normal breath sounds.  Abdominal:     Palpations: Abdomen is soft.     Tenderness: There is no abdominal tenderness.  Musculoskeletal:        General: No swelling.     Cervical back: Neck supple.  Skin:    General: Skin is warm and dry.     Capillary Refill: Capillary refill takes less than 2 seconds.  Neurological:     Mental Status: Thomas is alert.  Psychiatric:        Mood and Affect: Mood normal.      ED Course/ Medical Decision Making/ A&P    Procedures Procedures   Medications Ordered in ED Medications  iohexol (OMNIPAQUE) 300 MG/ML solution 100 mL (75 mLs Intravenous Contrast Given 08/16/22 1815)  Ampicillin-Sulbactam (UNASYN) 3 g in sodium chloride 0.9 % 100 mL IVPB (0 g Intravenous Stopped 08/16/22 2009)  ketorolac (TORADOL) 15 MG/ML injection 15 mg (15 mg Intravenous Given 08/16/22 1935)  acetaminophen (TYLENOL) tablet 650 mg (650 mg Oral Given 08/16/22 1935)    Medical Decision Making:    HENCE [name] is a 47  y.o. male who presented to the ED today with facial swelling detailed above.     Patient's presentation is complicated by their history of multiple comorbid medical problems including obesity.  Patient placed on continuous vitals and telemetry monitoring while in ED which was reviewed periodically.   Complete initial physical exam performed, notably the patient  was hemodynamically stable in no acute distress.  Substantial swelling daily detailed above.      Reviewed and confirmed nursing documentation for past medical history, family history, social history.    Initial Assessment:   With the patient's presentation of facial swelling, most likely diagnosis is  sinusitis. Other diagnoses were considered including (but not limited to) mucormycosis, invasive sinusitis with erosion. These are considered less likely due to history of present illness and physical exam findings.   This is most consistent with an acute life/limb threatening illness complicated by underlying chronic conditions.  Initial Plan:  CT face with contrast to evaluate for structural underlying infectious pathology including abscess formation Screening labs including CBC and Metabolic panel to evaluate for infectious or metabolic etiology of disease.  Objective evaluation as below reviewed with plan for close reassessment  Initial Study Results:   Laboratory  All laboratory results reviewed without evidence of clinically relevant pathology.   Radiology  All images reviewed independently. Agree with radiology report at this time.   CT MAXILLOFACIAL W CONTRAST  Result Date: 08/16/2022 CLINICAL DATA:  Severe facial swelling EXAM: CT MAXILLOFACIAL WITH CONTRAST TECHNIQUE: Multidetector CT imaging of the maxillofacial structures was performed with intravenous contrast. Multiplanar CT image reconstructions were also generated. RADIATION DOSE REDUCTION: This exam was performed according to the departmental dose-optimization program which includes automated exposure control, adjustment of the mA and/or kV according to patient size and/or use of iterative reconstruction technique. CONTRAST:  42m OMNIPAQUE IOHEXOL 300 MG/ML  SOLN COMPARISON:  None Available. FINDINGS: Osseous: Negative for fracture or erosion.  C4-5 non segmentation. Orbits: Negative. No postseptal traumatic or inflammatory finding. Sinuses: Mucosal thickening bilaterally especially at the frontal ethmoidal regions. Soft tissues: Soft tissue swelling and skin thickening asymmetric to the right face. No underlying abscess or odontogenic source is seen. Enlarged lymph node symmetrically in the upper neck, usually reactive, especially in  the setting. Limited intracranial: Negative IMPRESSION: Facial cellulitic changes with cervical adenopathy. No abscess or deep source identified. Electronically Signed   By: JJorje GuildM.D.   On: 08/16/2022 18:42       Final Assessment and Plan:   Patient appears to have developing facial cellulitis likely from his sinusitis. No evidence of abscess or other deep space infection.  Symptoms improved with symptomatic control in the emergency room.  Treated aggressively with IV Unasyn to achieve rapid control of source of his infection.  Will continue him on Augmentin for a total of 10 days and have him follow-up with a primary care provider as well as an ear nose and throat doctor in the outpatient setting for reassessment.  No acute indication for further intervention at this time patient stable for outpatient care management with strict return precautions regarding worsening or interval development of deep space infection.   Disposition:  I have considered need for hospitalization, however, considering all of the above, I believe this patient is stable for discharge at this time.  Patient/family educated about specific return precautions for given chief complaint and symptoms.  Patient/family educated about follow-up with PCP.     Patient/family expressed understanding of return precautions and need for follow-up. Patient  spoken to regarding all imaging and laboratory results and appropriate follow up for these results. All education provided in verbal form with additional information in written form. Time was allowed for answering of patient questions. Patient discharged.    Emergency Department Medication Summary:   Medications  iohexol (OMNIPAQUE) 300 MG/ML solution 100 mL (75 mLs Intravenous Contrast Given 08/16/22 1815)  Ampicillin-Sulbactam (UNASYN) 3 g in sodium chloride 0.9 % 100 mL IVPB (0 g Intravenous Stopped 08/16/22 2009)  ketorolac (TORADOL) 15 MG/ML injection 15 mg (15 mg  Intravenous Given 08/16/22 1935)  acetaminophen (TYLENOL) tablet 650 mg (650 mg Oral Given 08/16/22 1935)         Clinical Impression:  1. Facial swelling      Discharge   Final Clinical Impression(s) / ED Diagnoses Final diagnoses:  Facial swelling    Rx / DC Orders ED Discharge Orders          Ordered    amoxicillin-clavulanate (AUGMENTIN) 875-125 MG tablet  Every 12 hours        08/16/22 2012              Tretha Sciara, MD 08/16/22 2012

## 2022-09-11 ENCOUNTER — Other Ambulatory Visit: Payer: Self-pay | Admitting: Family Medicine

## 2022-09-12 IMAGING — NM NM HEPATO W/GB/PHARM/[PERSON_NAME]
2 series · 12 of 12 positions shown · non-contrast
Comparison: Right upper quadrant ultrasound September 12, 2021

CLINICAL DATA: Abdominal pain

EXAM:
NUCLEAR MEDICINE HEPATOBILIARY IMAGING WITH GALLBLADDER EF
TECHNIQUE: Sequential images of the abdomen were obtained [DATE] minutes
following intravenous administration of radiopharmaceutical. After
oral ingestion of Ensure, gallbladder ejection fraction was
determined. At 60 min, normal ejection fraction is greater than 33%.
RADIOPHARMACEUTICALS:  5.5 mCi 3c-FFm  Choletec IV

[Series 1: gbef ensure · 3.28mm/px · 6 of 60 frames shown]
[frame 6/60]
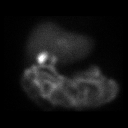
[frame 16/60]
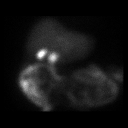
[frame 26/60]
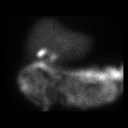
[frame 36/60]
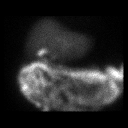
[frame 46/60]
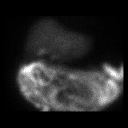
[frame 56/60]
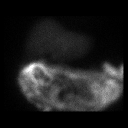

[Series 1: hida scan 1hr · 3.28mm/px · 6 of 60 frames shown]
[frame 6/60]
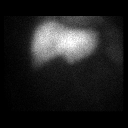
[frame 16/60]
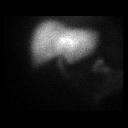
[frame 26/60]
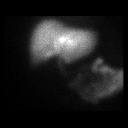
[frame 36/60]
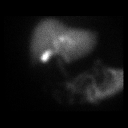
[frame 46/60]
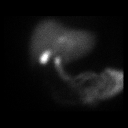
[frame 56/60]
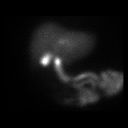

[12 of 12 positions shown; findings below may reference images not displayed]

FINDINGS: Prompt uptake and biliary excretion of activity by the liver is
seen. Gallbladder activity is visualized, consistent with patency of
cystic duct. Biliary activity passes into small bowel, consistent
with patent common bile duct.

Calculated gallbladder ejection fraction is 100%. (Normal
gallbladder ejection fraction with Ensure is greater than 33%.)
IMPRESSION: 1.  Patent cystic and common bile ducts.

2.  Normal gallbladder ejection fraction.

## 2022-09-18 ENCOUNTER — Other Ambulatory Visit: Payer: Self-pay | Admitting: Family Medicine

## 2022-10-01 ENCOUNTER — Other Ambulatory Visit: Payer: Self-pay | Admitting: Family Medicine

## 2022-10-01 DIAGNOSIS — K7030 Alcoholic cirrhosis of liver without ascites: Secondary | ICD-10-CM

## 2022-11-03 ENCOUNTER — Other Ambulatory Visit: Payer: Self-pay | Admitting: Family Medicine

## 2022-11-03 DIAGNOSIS — K529 Noninfective gastroenteritis and colitis, unspecified: Secondary | ICD-10-CM

## 2022-12-22 ENCOUNTER — Emergency Department (HOSPITAL_COMMUNITY): Payer: BC Managed Care – PPO

## 2022-12-22 ENCOUNTER — Encounter (HOSPITAL_COMMUNITY): Payer: Self-pay | Admitting: Emergency Medicine

## 2022-12-22 ENCOUNTER — Other Ambulatory Visit: Payer: Self-pay

## 2022-12-22 ENCOUNTER — Emergency Department (HOSPITAL_COMMUNITY)
Admission: EM | Admit: 2022-12-22 | Discharge: 2022-12-22 | Disposition: A | Discharge status: Home / Self Care | Payer: BC Managed Care – PPO | Source: Home / Self Care | Attending: Emergency Medicine | Admitting: Emergency Medicine

## 2022-12-22 DIAGNOSIS — F17290 Nicotine dependence, other tobacco product, uncomplicated: Secondary | ICD-10-CM | POA: Diagnosis not present

## 2022-12-22 DIAGNOSIS — R000 Tachycardia, unspecified: Secondary | ICD-10-CM | POA: Diagnosis not present

## 2022-12-22 DIAGNOSIS — K7030 Alcoholic cirrhosis of liver without ascites: Secondary | ICD-10-CM | POA: Diagnosis not present

## 2022-12-22 DIAGNOSIS — G8918 Other acute postprocedural pain: Secondary | ICD-10-CM | POA: Diagnosis not present

## 2022-12-22 DIAGNOSIS — D500 Iron deficiency anemia secondary to blood loss (chronic): Secondary | ICD-10-CM | POA: Diagnosis not present

## 2022-12-22 DIAGNOSIS — Z713 Dietary counseling and surveillance: Secondary | ICD-10-CM | POA: Diagnosis not present

## 2022-12-22 DIAGNOSIS — Z1152 Encounter for screening for COVID-19: Secondary | ICD-10-CM | POA: Diagnosis not present

## 2022-12-22 DIAGNOSIS — S8251XA Displaced fracture of medial malleolus of right tibia, initial encounter for closed fracture: Secondary | ICD-10-CM | POA: Diagnosis not present

## 2022-12-22 DIAGNOSIS — Z86010 Personal history of colonic polyps: Secondary | ICD-10-CM | POA: Diagnosis not present

## 2022-12-22 DIAGNOSIS — K922 Gastrointestinal hemorrhage, unspecified: Secondary | ICD-10-CM | POA: Diagnosis not present

## 2022-12-22 DIAGNOSIS — E46 Unspecified protein-calorie malnutrition: Secondary | ICD-10-CM | POA: Diagnosis not present

## 2022-12-22 DIAGNOSIS — I10 Essential (primary) hypertension: Secondary | ICD-10-CM | POA: Diagnosis not present

## 2022-12-22 DIAGNOSIS — S82401A Unspecified fracture of shaft of right fibula, initial encounter for closed fracture: Secondary | ICD-10-CM | POA: Diagnosis not present

## 2022-12-22 DIAGNOSIS — G8929 Other chronic pain: Secondary | ICD-10-CM | POA: Diagnosis not present

## 2022-12-22 DIAGNOSIS — K7460 Unspecified cirrhosis of liver: Secondary | ICD-10-CM | POA: Diagnosis not present

## 2022-12-22 DIAGNOSIS — K3189 Other diseases of stomach and duodenum: Secondary | ICD-10-CM | POA: Diagnosis present

## 2022-12-22 DIAGNOSIS — S82301A Unspecified fracture of lower end of right tibia, initial encounter for closed fracture: Secondary | ICD-10-CM | POA: Diagnosis not present

## 2022-12-22 DIAGNOSIS — I8501 Esophageal varices with bleeding: Secondary | ICD-10-CM | POA: Diagnosis not present

## 2022-12-22 DIAGNOSIS — D649 Anemia, unspecified: Secondary | ICD-10-CM | POA: Diagnosis not present

## 2022-12-22 DIAGNOSIS — K254 Chronic or unspecified gastric ulcer with hemorrhage: Secondary | ICD-10-CM | POA: Diagnosis not present

## 2022-12-22 DIAGNOSIS — Z8249 Family history of ischemic heart disease and other diseases of the circulatory system: Secondary | ICD-10-CM | POA: Diagnosis not present

## 2022-12-22 DIAGNOSIS — F1020 Alcohol dependence, uncomplicated: Secondary | ICD-10-CM | POA: Diagnosis not present

## 2022-12-22 DIAGNOSIS — K766 Portal hypertension: Secondary | ICD-10-CM | POA: Diagnosis not present

## 2022-12-22 DIAGNOSIS — K700 Alcoholic fatty liver: Secondary | ICD-10-CM | POA: Diagnosis not present

## 2022-12-22 DIAGNOSIS — F1011 Alcohol abuse, in remission: Secondary | ICD-10-CM | POA: Diagnosis not present

## 2022-12-22 DIAGNOSIS — I8511 Secondary esophageal varices with bleeding: Secondary | ICD-10-CM | POA: Diagnosis not present

## 2022-12-22 DIAGNOSIS — S82841A Displaced bimalleolar fracture of right lower leg, initial encounter for closed fracture: Secondary | ICD-10-CM | POA: Insufficient documentation

## 2022-12-22 DIAGNOSIS — M545 Low back pain, unspecified: Secondary | ICD-10-CM | POA: Diagnosis not present

## 2022-12-22 DIAGNOSIS — S93431A Sprain of tibiofibular ligament of right ankle, initial encounter: Secondary | ICD-10-CM | POA: Diagnosis not present

## 2022-12-22 DIAGNOSIS — W1830XA Fall on same level, unspecified, initial encounter: Secondary | ICD-10-CM | POA: Insufficient documentation

## 2022-12-22 DIAGNOSIS — Z833 Family history of diabetes mellitus: Secondary | ICD-10-CM | POA: Diagnosis not present

## 2022-12-22 DIAGNOSIS — S82201A Unspecified fracture of shaft of right tibia, initial encounter for closed fracture: Secondary | ICD-10-CM | POA: Diagnosis not present

## 2022-12-22 DIAGNOSIS — I864 Gastric varices: Secondary | ICD-10-CM | POA: Diagnosis not present

## 2022-12-22 DIAGNOSIS — F1010 Alcohol abuse, uncomplicated: Secondary | ICD-10-CM | POA: Diagnosis not present

## 2022-12-22 DIAGNOSIS — L409 Psoriasis, unspecified: Secondary | ICD-10-CM | POA: Diagnosis not present

## 2022-12-22 DIAGNOSIS — I8510 Secondary esophageal varices without bleeding: Secondary | ICD-10-CM | POA: Diagnosis not present

## 2022-12-22 DIAGNOSIS — Z885 Allergy status to narcotic agent status: Secondary | ICD-10-CM | POA: Diagnosis not present

## 2022-12-22 DIAGNOSIS — G4700 Insomnia, unspecified: Secondary | ICD-10-CM | POA: Diagnosis not present

## 2022-12-22 DIAGNOSIS — L4050 Arthropathic psoriasis, unspecified: Secondary | ICD-10-CM | POA: Diagnosis not present

## 2022-12-22 DIAGNOSIS — S82891D Other fracture of right lower leg, subsequent encounter for closed fracture with routine healing: Secondary | ICD-10-CM | POA: Diagnosis not present

## 2022-12-22 DIAGNOSIS — R600 Localized edema: Secondary | ICD-10-CM | POA: Diagnosis not present

## 2022-12-22 DIAGNOSIS — R944 Abnormal results of kidney function studies: Secondary | ICD-10-CM | POA: Diagnosis present

## 2022-12-22 DIAGNOSIS — J45909 Unspecified asthma, uncomplicated: Secondary | ICD-10-CM | POA: Diagnosis not present

## 2022-12-22 DIAGNOSIS — Z6841 Body Mass Index (BMI) 40.0 and over, adult: Secondary | ICD-10-CM | POA: Diagnosis not present

## 2022-12-22 DIAGNOSIS — R609 Edema, unspecified: Secondary | ICD-10-CM | POA: Diagnosis not present

## 2022-12-22 DIAGNOSIS — K920 Hematemesis: Secondary | ICD-10-CM | POA: Diagnosis not present

## 2022-12-22 DIAGNOSIS — D696 Thrombocytopenia, unspecified: Secondary | ICD-10-CM | POA: Diagnosis not present

## 2022-12-22 DIAGNOSIS — D62 Acute posthemorrhagic anemia: Secondary | ICD-10-CM | POA: Diagnosis not present

## 2022-12-22 DIAGNOSIS — S82891A Other fracture of right lower leg, initial encounter for closed fracture: Secondary | ICD-10-CM | POA: Diagnosis not present

## 2022-12-22 DIAGNOSIS — S92101A Unspecified fracture of right talus, initial encounter for closed fracture: Secondary | ICD-10-CM | POA: Diagnosis not present

## 2022-12-22 DIAGNOSIS — D6959 Other secondary thrombocytopenia: Secondary | ICD-10-CM | POA: Diagnosis not present

## 2022-12-22 DIAGNOSIS — I959 Hypotension, unspecified: Secondary | ICD-10-CM | POA: Diagnosis not present

## 2022-12-22 DIAGNOSIS — D689 Coagulation defect, unspecified: Secondary | ICD-10-CM | POA: Diagnosis not present

## 2022-12-22 DIAGNOSIS — G4733 Obstructive sleep apnea (adult) (pediatric): Secondary | ICD-10-CM | POA: Diagnosis not present

## 2022-12-22 DIAGNOSIS — D684 Acquired coagulation factor deficiency: Secondary | ICD-10-CM | POA: Diagnosis not present

## 2022-12-22 DIAGNOSIS — M25571 Pain in right ankle and joints of right foot: Secondary | ICD-10-CM | POA: Diagnosis not present

## 2022-12-22 DIAGNOSIS — Z79899 Other long term (current) drug therapy: Secondary | ICD-10-CM | POA: Diagnosis not present

## 2022-12-22 DIAGNOSIS — Z818 Family history of other mental and behavioral disorders: Secondary | ICD-10-CM | POA: Diagnosis not present

## 2022-12-22 DIAGNOSIS — S82841D Displaced bimalleolar fracture of right lower leg, subsequent encounter for closed fracture with routine healing: Secondary | ICD-10-CM | POA: Diagnosis not present

## 2022-12-22 DIAGNOSIS — D638 Anemia in other chronic diseases classified elsewhere: Secondary | ICD-10-CM | POA: Diagnosis not present

## 2022-12-22 MED ORDER — FENTANYL CITRATE PF 50 MCG/ML IJ SOSY
50.0000 ug | PREFILLED_SYRINGE | Freq: Once | INTRAMUSCULAR | Status: AC
  Administered 2022-12-22: 50 ug via INTRAVENOUS
  Filled 2022-12-22: qty 1

## 2022-12-22 MED ORDER — MIDAZOLAM HCL 2 MG/2ML IJ SOLN: 2.0000 mg | Freq: Once | INTRAMUSCULAR | Status: DC

## 2022-12-22 MED ORDER — OXYCODONE-ACETAMINOPHEN 5-325 MG PO TABS: 1.0000 | ORAL_TABLET | Freq: Four times a day (QID) | ORAL | 0 refills | Status: DC | PRN

## 2022-12-22 MED ORDER — OXYCODONE-ACETAMINOPHEN 5-325 MG PO TABS
1.0000 | ORAL_TABLET | Freq: Once | ORAL | Status: AC
  Administered 2022-12-22: 1 via ORAL
  Filled 2022-12-22: qty 1

## 2022-12-22 MED ORDER — PROPOFOL 10 MG/ML IV BOLUS
2.0000 mg/kg | Freq: Once | INTRAVENOUS | Status: AC
  Administered 2022-12-22 (×3): 65 mg via INTRAVENOUS
  Filled 2022-12-22: qty 40

## 2022-12-22 MED ORDER — CYCLOBENZAPRINE HCL 5 MG PO TABS: 5.0000 mg | ORAL_TABLET | Freq: Three times a day (TID) | ORAL | 0 refills | Status: DC | PRN

## 2022-12-22 MED ORDER — KETAMINE HCL 50 MG/5ML IJ SOSY
0.3000 mg/kg | PREFILLED_SYRINGE | Freq: Once | INTRAMUSCULAR | Status: AC
  Administered 2022-12-22: 39 mg via INTRAVENOUS
  Filled 2022-12-22: qty 5

## 2022-12-22 NOTE — ED Provider Notes (Signed)
[place] EMERGENCY DEPARTMENT AT [facility] Provider Note  MDM   HPI/ROS:  Thomas [name] is a 47 y.o. male with a medical history as below who presents with complaint of right ankle pain.  He reports that on Saturday he suffered a fall from standing.  Ever since then he has been having increasing pain in his right ankle as well as swelling and some bruising.  He is unable to bear weight at this time.  Came in for evaluation after he was unable to stand today.  Denies any numbness or tingling distal.  Did not hit his head, or lose consciousness.  No other complaints at this time.  Physical exam is notable for: - Swelling and ecchymosis about the entire right ankle, neurovascularly intact distal  On my initial evaluation, patient is:  -Vital signs stable. Patient afebrile, hemodynamically stable, and non-toxic appearing.  This patient's current presentation, including their history and physical exam, is most consistent with ankle fracture. Differentials include sprain, strain. I have very low concern for compartment syndrome at this time. Review of films shows he has a distal fibular fracture as well as medial malleolus.  Will add a tib-fib to his imaging to eval for any further fracture higher.  Will pursue pain control and discuss with Ortho.  Suspect possibly splint and discharge with close follow-up as an outpatient.   Interpretations, interventions, and the patient's course of care are documented below.    Clinical Course as of 12/23/22 0023  Mon Dec 22, 2022  1850 DG Ankle Complete Right Bimal fracture [BB]  1955 Spoke with orthopedics who agreed with the plan for splinting and discharge with close outpatient follow-up.  Will perform a reduction under procedural sedation and splint. [BB]  Tue Dec 23, 2022  0022 DG Ankle 2 Views Right Interval reduction with application of splint.  [BB]    Clinical Course User Index [BB] Fayrene Helper, MD    After closed reduction and  splinting of the foot, the patient was neurovascularly intact.  He was reevaluated and plan for discharge was discussed.  I sent him home with prescription for pain medicine and some muscle relaxant.  Disposition:  I discussed the plan for discharge with the patient and/or their surrogate at bedside prior to discharge and they were in agreement with the plan and verbalized understanding of the return precautions provided. All questions answered to the best of my ability. Ultimately, the patient was discharged in stable condition with stable vital signs. I am reassured that they are capable of close follow up and good social support at home.   Clinical Impression: No diagnosis found.  Rx / DC Orders ED Discharge Orders     None       The plan for this patient was discussed with Dr. Eloise Harman, who voiced agreement and who oversaw evaluation and treatment of this patient.   Clinical Complexity A medically appropriate history, review of systems, and physical exam was performed.  My independent interpretations of EKG, labs, and radiology are documented in the ED course above.   If decision rules were used in this patient's evaluation, they are listed below.   Click here for ABCD2, HEART and other calculatorsREFRESH Note before signing   Patient's presentation is most consistent with acute presentation with potential threat to life or bodily function.  Medical Decision Making Amount and/or Complexity of Data Reviewed Independent Historian: parent    Details: Father at bedside Radiology: ordered and independent interpretation performed. Decision-making details documented  in ED Course.  Risk Prescription drug management.    HPI/ROS      See MDM section for pertinent HPI and ROS. A complete ROS was performed with pertinent positives/negatives noted above.   Past Medical History:  Diagnosis Date   Alcoholic fatty liver 01/16/2010   Needs final HBV and HAV vaccines on or after  10/25/2012    Alcoholism (HCC) 12/25/2011   Allergic rhinitis    Childhood asthma    Elevated transaminase level 06/10/2007   AST: 80 ALT: 136 in 8/11: Hepatitis A., B and C negative.    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    Sleep apnea     Past Surgical History:  Procedure Laterality Date   ESOPHAGOGASTRODUODENOSCOPY (EGD) WITH PROPOFOL N/A 07/05/2022   Procedure: ESOPHAGOGASTRODUODENOSCOPY (EGD) WITH PROPOFOL;  Surgeon: Napoleon Form, MD;  Location: [facility] ENDOSCOPY;  Service: Gastroenterology;  Laterality: N/A;      Physical Exam   Vitals:   12/22/22 1142 12/22/22 1204 12/22/22 1537  BP: (!) 140/73  (!) 155/74  Pulse: 92  87  Resp: 16  18  Temp: 98.5 F (36.9 C)  98.2 F (36.8 C)  TempSrc: Oral  Temporal  SpO2: 98%  100%  Weight:  129.3 kg   Height:  5\' 5"  (1.651 m)     Physical Exam Vitals and nursing note reviewed.  Constitutional:      General: He is not in acute distress.    Appearance: He is well-developed.  HENT:     Head: Normocephalic and atraumatic.  Eyes:     Conjunctiva/sclera: Conjunctivae normal.  Cardiovascular:     Rate and Rhythm: Normal rate and regular rhythm.     Heart sounds: No murmur heard. Pulmonary:     Effort: Pulmonary effort is normal. No respiratory distress.     Breath sounds: Normal breath sounds.  Abdominal:     Palpations: Abdomen is soft.     Tenderness: There is no abdominal tenderness.  Musculoskeletal:     Cervical back: Neck supple.     Right ankle: Deformity and ecchymosis present. Tenderness present over the lateral malleolus and medial malleolus.  Skin:    General: Skin is warm and dry.     Capillary Refill: Capillary refill takes less than 2 seconds.  Neurological:     Mental Status: He is alert.  Psychiatric:        Mood and Affect: Mood normal.      Procedures   If procedures were preformed on this patient, they are listed below:  .Ortho Injury  Treatment  Date/Time: 12/23/2022 12:25 AM  Performed by: Fayrene Helper, MD Authorized by: Rondel Baton, MD   Consent:    Consent obtained:  Written   Consent given by:  Patient   Risks discussed:  FractureInjury location: ankle Location details: right ankle Injury type: fracture-dislocation Fracture type: bimalleolar Pre-procedure neurovascular assessment: neurovascularly intact Pre-procedure distal perfusion: normal Pre-procedure neurological function: normal Pre-procedure range of motion: reduced  Patient sedated: Yes. Refer to sedation procedure documentation for details of sedation. Manipulation performed: yes Immobilization: splint Splint type: short leg Splint Applied by: Ortho Tech Supplies used: cotton padding, elastic bandage and Ortho-Glass Post-procedure neurovascular assessment: post-procedure neurovascularly intact Post-procedure distal perfusion: normal Post-procedure neurological function: normal Post-procedure range of motion: unchanged   .Sedation  Date/Time: 12/23/2022 12:29 AM  Performed by: Fayrene Helper, MD Authorized by: Rondel Baton, MD   Consent:  Consent obtained:  Written and verbal   Consent given by:  Patient Universal protocol:    Procedure explained and questions answered to patient or proxy's satisfaction: yes     Imaging studies available: yes     Site/side marked: yes     Immediately prior to procedure, a time out was called: yes   Indications:    Procedure performed:  Fracture reduction   Procedure necessitating sedation performed by:  Physician performing sedation Pre-sedation assessment:    Time since last food or drink:  11 hours   ASA classification: class 2 - patient with mild systemic disease     Mouth opening:  3 or more finger widths   Thyromental distance:  3 finger widths   Mallampati score:  II - soft palate, uvula, fauces visible   Neck mobility: normal     Pre-sedation assessments completed and  reviewed: airway patency, mental status, nausea/vomiting, pain level and respiratory function     Pre-sedation assessment completed:  12/22/2022 10:00 PM Immediate pre-procedure details:    Reassessment: Patient reassessed immediately prior to procedure     Reviewed: vital signs, relevant labs/tests and NPO status     Verified: bag valve mask available, emergency equipment available, intubation equipment available, IV patency confirmed, oxygen available and suction available   Procedure details (see MAR for exact dosages):    Preoxygenation:  Nasal cannula   Sedation:  Propofol and ketamine   Intended level of sedation: deep   Analgesia: Ketamine, as above.   Intra-procedure monitoring:  Blood pressure monitoring, continuous capnometry, continuous pulse oximetry, frequent vital sign checks, frequent LOC assessments and cardiac monitor   Intra-procedure events: none     Total Provider sedation time (minutes):  20 Post-procedure details:    Post-sedation assessment completed:  12/22/2022 10:50 PM   Attendance: Constant attendance by certified staff until patient recovered     Recovery: Patient returned to pre-procedure baseline     Post-sedation assessments completed and reviewed: airway patency, mental status, nausea/vomiting, pain level and respiratory function     Patient is stable for discharge or admission: yes     Procedure completion:  Tolerated well, no immediate complications    Fayrene Helper, MD Emergency Medicine PGY-2   Please note that this documentation was produced with the assistance of voice-to-text technology and may contain errors.    Fayrene Helper, MD 12/23/22 [facility]    Rondel Baton, MD 12/24/22 [phone]

## 2022-12-22 NOTE — Discharge Instructions (Addendum)
You are seen today for right ankle pain.  While you were here we got x-rays, and performed a reduction and splinting of your right ankle.  You have a fracture of your ankle.   I spoke to Dr. Carola Frost, the orthopedic surgeon, who is expecting you to call his office tomorrow to schedule follow-up.  I am sending a prescription for pain medication and muscle relaxants to your pharmacy.  Please use these if Tylenol and ibuprofen alone are not taking care of your pain.  Do not walk on your fractured foot, until you are cleared to do so by physician.  Come back to the emergency department if you have any numbness or tingling, or any cold feeling in your toes.  Additionally, if you have any other concerns.  Do not get your splint wet.  I have included instructions above.  Additionally, there is information about 2 of the medical supply stores in the area.  Feel free to google and find one that is closer to you if these are too far away.

## 2022-12-22 NOTE — ED Notes (Signed)
Charge nurse aware of conscious sedation needs and currently looking for an open bed to do it in.

## 2022-12-22 NOTE — Progress Notes (Signed)
RT at bedside for conscious sedation  

## 2022-12-22 NOTE — ED Triage Notes (Signed)
Pt tripped and fell onto to right foot 2 days ago. Swelling and pain have gotten worse. Unable to walk on right foot. Also states landed on both knees with slight bruising noted. Able to freely move knees.

## 2022-12-25 ENCOUNTER — Encounter (HOSPITAL_COMMUNITY): Payer: Self-pay

## 2022-12-25 ENCOUNTER — Other Ambulatory Visit: Payer: Self-pay

## 2022-12-25 ENCOUNTER — Inpatient Hospital Stay (HOSPITAL_COMMUNITY)
Admission: EM | Admit: 2022-12-25 | Discharge: 2023-01-07 | DRG: 981 | Disposition: A | Discharge status: Rehabilitation Facility | Payer: BC Managed Care – PPO | Attending: Family Medicine | Admitting: Family Medicine

## 2022-12-25 DIAGNOSIS — Z8249 Family history of ischemic heart disease and other diseases of the circulatory system: Secondary | ICD-10-CM

## 2022-12-25 DIAGNOSIS — Z6841 Body Mass Index (BMI) 40.0 and over, adult: Secondary | ICD-10-CM | POA: Diagnosis not present

## 2022-12-25 DIAGNOSIS — D696 Thrombocytopenia, unspecified: Secondary | ICD-10-CM | POA: Diagnosis not present

## 2022-12-25 DIAGNOSIS — K766 Portal hypertension: Secondary | ICD-10-CM | POA: Diagnosis present

## 2022-12-25 DIAGNOSIS — D649 Anemia, unspecified: Secondary | ICD-10-CM | POA: Diagnosis not present

## 2022-12-25 DIAGNOSIS — S82841A Displaced bimalleolar fracture of right lower leg, initial encounter for closed fracture: Secondary | ICD-10-CM | POA: Diagnosis present

## 2022-12-25 DIAGNOSIS — K7460 Unspecified cirrhosis of liver: Secondary | ICD-10-CM | POA: Diagnosis not present

## 2022-12-25 DIAGNOSIS — M25571 Pain in right ankle and joints of right foot: Secondary | ICD-10-CM | POA: Diagnosis not present

## 2022-12-25 DIAGNOSIS — D689 Coagulation defect, unspecified: Secondary | ICD-10-CM | POA: Diagnosis not present

## 2022-12-25 DIAGNOSIS — I8510 Secondary esophageal varices without bleeding: Secondary | ICD-10-CM | POA: Diagnosis present

## 2022-12-25 DIAGNOSIS — Z79899 Other long term (current) drug therapy: Secondary | ICD-10-CM

## 2022-12-25 DIAGNOSIS — S82891A Other fracture of right lower leg, initial encounter for closed fracture: Secondary | ICD-10-CM | POA: Insufficient documentation

## 2022-12-25 DIAGNOSIS — Z833 Family history of diabetes mellitus: Secondary | ICD-10-CM | POA: Diagnosis not present

## 2022-12-25 DIAGNOSIS — K922 Gastrointestinal hemorrhage, unspecified: Secondary | ICD-10-CM | POA: Diagnosis present

## 2022-12-25 DIAGNOSIS — D62 Acute posthemorrhagic anemia: Secondary | ICD-10-CM | POA: Diagnosis present

## 2022-12-25 DIAGNOSIS — Z1152 Encounter for screening for COVID-19: Secondary | ICD-10-CM

## 2022-12-25 DIAGNOSIS — L4050 Arthropathic psoriasis, unspecified: Secondary | ICD-10-CM | POA: Diagnosis present

## 2022-12-25 DIAGNOSIS — R609 Edema, unspecified: Secondary | ICD-10-CM | POA: Diagnosis not present

## 2022-12-25 DIAGNOSIS — K920 Hematemesis: Principal | ICD-10-CM

## 2022-12-25 DIAGNOSIS — K3189 Other diseases of stomach and duodenum: Secondary | ICD-10-CM | POA: Diagnosis present

## 2022-12-25 DIAGNOSIS — Z86010 Personal history of colonic polyps: Secondary | ICD-10-CM

## 2022-12-25 DIAGNOSIS — Z713 Dietary counseling and surveillance: Secondary | ICD-10-CM

## 2022-12-25 DIAGNOSIS — Z885 Allergy status to narcotic agent status: Secondary | ICD-10-CM

## 2022-12-25 DIAGNOSIS — R600 Localized edema: Secondary | ICD-10-CM | POA: Diagnosis present

## 2022-12-25 DIAGNOSIS — D684 Acquired coagulation factor deficiency: Secondary | ICD-10-CM | POA: Diagnosis present

## 2022-12-25 DIAGNOSIS — K254 Chronic or unspecified gastric ulcer with hemorrhage: Secondary | ICD-10-CM | POA: Diagnosis not present

## 2022-12-25 DIAGNOSIS — W1830XA Fall on same level, unspecified, initial encounter: Secondary | ICD-10-CM | POA: Diagnosis present

## 2022-12-25 DIAGNOSIS — I8511 Secondary esophageal varices with bleeding: Secondary | ICD-10-CM | POA: Diagnosis present

## 2022-12-25 DIAGNOSIS — D638 Anemia in other chronic diseases classified elsewhere: Secondary | ICD-10-CM | POA: Diagnosis present

## 2022-12-25 DIAGNOSIS — M545 Low back pain, unspecified: Secondary | ICD-10-CM | POA: Diagnosis not present

## 2022-12-25 DIAGNOSIS — L409 Psoriasis, unspecified: Secondary | ICD-10-CM | POA: Diagnosis not present

## 2022-12-25 DIAGNOSIS — G4733 Obstructive sleep apnea (adult) (pediatric): Secondary | ICD-10-CM | POA: Diagnosis present

## 2022-12-25 DIAGNOSIS — R944 Abnormal results of kidney function studies: Secondary | ICD-10-CM | POA: Diagnosis present

## 2022-12-25 DIAGNOSIS — F1020 Alcohol dependence, uncomplicated: Secondary | ICD-10-CM | POA: Diagnosis not present

## 2022-12-25 DIAGNOSIS — K7030 Alcoholic cirrhosis of liver without ascites: Secondary | ICD-10-CM | POA: Diagnosis not present

## 2022-12-25 DIAGNOSIS — K700 Alcoholic fatty liver: Secondary | ICD-10-CM | POA: Diagnosis present

## 2022-12-25 DIAGNOSIS — S82891D Other fracture of right lower leg, subsequent encounter for closed fracture with routine healing: Secondary | ICD-10-CM | POA: Diagnosis not present

## 2022-12-25 DIAGNOSIS — J45909 Unspecified asthma, uncomplicated: Secondary | ICD-10-CM | POA: Diagnosis present

## 2022-12-25 DIAGNOSIS — F17290 Nicotine dependence, other tobacco product, uncomplicated: Secondary | ICD-10-CM | POA: Diagnosis present

## 2022-12-25 DIAGNOSIS — F1090 Alcohol use, unspecified, uncomplicated: Secondary | ICD-10-CM | POA: Diagnosis present

## 2022-12-25 DIAGNOSIS — I8501 Esophageal varices with bleeding: Secondary | ICD-10-CM | POA: Diagnosis not present

## 2022-12-25 DIAGNOSIS — S93431A Sprain of tibiofibular ligament of right ankle, initial encounter: Secondary | ICD-10-CM | POA: Diagnosis present

## 2022-12-25 DIAGNOSIS — Z818 Family history of other mental and behavioral disorders: Secondary | ICD-10-CM | POA: Diagnosis not present

## 2022-12-25 DIAGNOSIS — I10 Essential (primary) hypertension: Secondary | ICD-10-CM | POA: Diagnosis present

## 2022-12-25 DIAGNOSIS — D6959 Other secondary thrombocytopenia: Secondary | ICD-10-CM | POA: Diagnosis not present

## 2022-12-25 DIAGNOSIS — I959 Hypotension, unspecified: Secondary | ICD-10-CM | POA: Diagnosis not present

## 2022-12-25 DIAGNOSIS — D500 Iron deficiency anemia secondary to blood loss (chronic): Secondary | ICD-10-CM | POA: Diagnosis not present

## 2022-12-25 DIAGNOSIS — I864 Gastric varices: Secondary | ICD-10-CM | POA: Diagnosis not present

## 2022-12-25 LAB — PROTIME-INR
INR: 1.5 — ABNORMAL HIGH (ref 0.8–1.2)
Prothrombin Time: 18.3 seconds — ABNORMAL HIGH (ref 11.4–15.2)

## 2022-12-25 LAB — COMPREHENSIVE METABOLIC PANEL
ALT: 36 U/L (ref 0–44)
AST: 66 U/L — ABNORMAL HIGH (ref 15–41)
Albumin: 2.4 g/dL — ABNORMAL LOW (ref 3.5–5.0)
Alkaline Phosphatase: 108 U/L (ref 38–126)
Anion gap: 10 (ref 5–15)
BUN: 28 mg/dL — ABNORMAL HIGH (ref 6–20)
CO2: 22 mmol/L (ref 22–32)
Calcium: 8.4 mg/dL — ABNORMAL LOW (ref 8.9–10.3)
Chloride: 103 mmol/L (ref 98–111)
Creatinine, Ser: 0.82 mg/dL (ref 0.61–1.24)
GFR, Estimated: >60 mL/min (ref >60)
Glucose, Bld: 103 mg/dL — ABNORMAL HIGH (ref 70–99)
Potassium: 4.5 mmol/L (ref 3.5–5.1)
Sodium: 135 mmol/L (ref 135–145)
Total Bilirubin: 2.7 mg/dL — ABNORMAL HIGH (ref 0.3–1.2)
Total Protein: 5.4 g/dL — ABNORMAL LOW (ref 6.5–8.1)

## 2022-12-25 LAB — CBC
HCT: 23.9 % — ABNORMAL LOW (ref 39.0–52.0)
HCT: 22.5 % — ABNORMAL LOW (ref 39.0–52.0)
Hemoglobin: 7 g/dL — ABNORMAL LOW (ref 13.0–17.0)
Hemoglobin: 7 g/dL — ABNORMAL LOW (ref 13.0–17.0)
MCH: 25.5 pg — ABNORMAL LOW (ref 26.0–34.0)
MCH: 26 pg (ref 26.0–34.0)
MCHC: 29.3 g/dL — ABNORMAL LOW (ref 30.0–36.0)
MCHC: 31.1 g/dL (ref 30.0–36.0)
MCV: 86.9 fL (ref 80.0–100.0)
MCV: 83.6 fL (ref 80.0–100.0)
Platelets: 176 10*3/uL (ref 150–400)
Platelets: 152 10*3/uL (ref 150–400)
RBC: 2.75 MIL/uL — ABNORMAL LOW (ref 4.22–5.81)
RBC: 2.69 MIL/uL — ABNORMAL LOW (ref 4.22–5.81)
RDW: 20.6 % — ABNORMAL HIGH (ref 11.5–15.5)
RDW: 21.4 % — ABNORMAL HIGH (ref 11.5–15.5)
WBC: 6.6 10*3/uL (ref 4.0–10.5)
WBC: 6.6 10*3/uL (ref 4.0–10.5)
nRBC: 0.5 % — ABNORMAL HIGH (ref 0.0–0.2)
nRBC: 0 % (ref 0.0–0.2)

## 2022-12-25 LAB — PREPARE RBC (CROSSMATCH)

## 2022-12-25 LAB — RESP PANEL BY RT-PCR (RSV, FLU A&B, COVID)  RVPGX2
Influenza A by PCR: NEGATIVE
Influenza B by PCR: NEGATIVE
Resp Syncytial Virus by PCR: NEGATIVE
SARS Coronavirus 2 by RT PCR: NEGATIVE

## 2022-12-25 MED ORDER — SODIUM CHLORIDE 0.9% IV SOLUTION: Freq: Once | INTRAVENOUS | Status: DC

## 2022-12-25 MED ORDER — SODIUM CHLORIDE 0.9 % IV SOLN
50.0000 ug/h | INTRAVENOUS | Status: DC
  Administered 2022-12-25 – 2022-12-30 (×8): 50 ug/h via INTRAVENOUS
  Filled 2022-12-25 (×12): qty 1

## 2022-12-25 MED ORDER — OCTREOTIDE LOAD VIA INFUSION
50.0000 ug | Freq: Once | INTRAVENOUS | Status: AC
  Administered 2022-12-25: 50 ug via INTRAVENOUS
  Filled 2022-12-25: qty 25

## 2022-12-25 MED ORDER — ONDANSETRON HCL 4 MG/2ML IJ SOLN
4.0000 mg | Freq: Once | INTRAMUSCULAR | Status: AC
  Administered 2022-12-25: 4 mg via INTRAVENOUS
  Filled 2022-12-25: qty 2

## 2022-12-25 MED ORDER — ONDANSETRON HCL 4 MG/2ML IJ SOLN: 4.0000 mg | Freq: Four times a day (QID) | INTRAMUSCULAR | Status: DC | PRN

## 2022-12-25 MED ORDER — MORPHINE SULFATE (PF) 4 MG/ML IV SOLN
4.0000 mg | Freq: Once | INTRAVENOUS | Status: AC
  Administered 2022-12-25: 4 mg via INTRAVENOUS
  Filled 2022-12-25: qty 1

## 2022-12-25 MED ORDER — PROPRANOLOL HCL 20 MG PO TABS
20.0000 mg | ORAL_TABLET | Freq: Two times a day (BID) | ORAL | Status: DC
  Administered 2022-12-25 – 2022-12-27 (×3): 20 mg via ORAL
  Filled 2022-12-25 (×5): qty 1

## 2022-12-25 MED ORDER — ACETAMINOPHEN 650 MG RE SUPP: 650.0000 mg | Freq: Four times a day (QID) | RECTAL | Status: DC | PRN

## 2022-12-25 MED ORDER — LACTATED RINGERS IV BOLUS
1000.0000 mL | Freq: Once | INTRAVENOUS | Status: AC
  Administered 2022-12-25: 1000 mL via INTRAVENOUS

## 2022-12-25 MED ORDER — SODIUM CHLORIDE 0.9 % IV SOLN
1.0000 g | INTRAVENOUS | Status: DC
  Administered 2022-12-26 – 2022-12-27 (×2): 1 g via INTRAVENOUS
  Filled 2022-12-25 (×4): qty 10

## 2022-12-25 MED ORDER — OXYCODONE HCL 5 MG PO TABS
5.0000 mg | ORAL_TABLET | Freq: Four times a day (QID) | ORAL | Status: DC | PRN
  Administered 2022-12-25 – 2022-12-29 (×10): 5 mg via ORAL
  Filled 2022-12-25 (×11): qty 1

## 2022-12-25 MED ORDER — SODIUM CHLORIDE 0.9 % IV SOLN
1.0000 g | Freq: Once | INTRAVENOUS | Status: AC
  Administered 2022-12-25: 1 g via INTRAVENOUS
  Filled 2022-12-25: qty 10

## 2022-12-25 MED ORDER — ACETAMINOPHEN 325 MG PO TABS
650.0000 mg | ORAL_TABLET | Freq: Four times a day (QID) | ORAL | Status: DC | PRN
  Administered 2022-12-25 – 2022-12-29 (×9): 650 mg via ORAL
  Filled 2022-12-25 (×9): qty 2

## 2022-12-25 MED ORDER — PANTOPRAZOLE SODIUM 40 MG IV SOLR
40.0000 mg | Freq: Once | INTRAVENOUS | Status: AC
  Administered 2022-12-25: 40 mg via INTRAVENOUS
  Filled 2022-12-25: qty 10

## 2022-12-25 MED ORDER — SODIUM CHLORIDE 0.9% FLUSH
3.0000 mL | Freq: Two times a day (BID) | INTRAVENOUS | Status: DC
  Administered 2022-12-27 – 2023-01-07 (×10): 3 mL via INTRAVENOUS

## 2022-12-25 NOTE — ED Notes (Signed)
ED TO INPATIENT HANDOFF REPORT  ED Nurse Name and Phone #:   S Name/Age/Gender Thomas [name] 47 y.o. male Room/Bed: 015C/015C  Code Status   Code Status: Full Code  Home/SNF/Other Home Patient oriented to: self, place, time, and situation Is this baseline? Yes   Triage Complete: Triage complete  Chief Complaint Acute GI bleeding [K92.2]  Triage Note Pt c/o hematemesis started at 0400 today. Pt states it dark red blood. Pt is pale/jaundice. Pt c/o dizziness and weakness.   Allergies Allergies  Allergen Reactions   Codeine Other (See Comments)    Jittery     Level of Care/Admitting Diagnosis ED Disposition     ED Disposition  Admit   Condition  --   Comment  Hospital Area: [facility] [[id number]]  Level of Care: Progressive [102]  Admit to Progressive based on following criteria: GI, ENDOCRINE disease patients with GI bleeding, acute liver failure or pancreatitis, stable with diabetic ketoacidosis or thyrotoxicosis (hypothyroid) state.  May admit patient to [facility] or [facility] if equivalent level of care is available:: No  Covid Evaluation: Asymptomatic - no recent exposure (last 10 days) testing not required  Diagnosis: Acute GI bleeding [253168]  Admitting Physician: Synetta Fail [[id number]]  Attending Physician: Synetta Fail [phone]  Certification:: I certify this patient will need inpatient services for at least 2 midnights  Estimated Length of Stay: 2          B Medical/Surgery History Past Medical History:  Diagnosis Date   Acute conjunctivitis of both eyes 10/22/2021   Acute sinusitis 06/16/2013   Alcoholic fatty liver 01/16/2010   Needs final HBV and HAV vaccines on or after 10/25/2012    Alcoholism (HCC) 12/25/2011   Allergic rhinitis    Childhood asthma    Elevated transaminase level 06/10/2007   AST: 80 ALT: 136 in 8/11: Hepatitis A., B and C negative.    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    Sleep apnea    Past Surgical History:  Procedure Laterality Date   ESOPHAGOGASTRODUODENOSCOPY (EGD) WITH PROPOFOL N/A 07/05/2022   Procedure: ESOPHAGOGASTRODUODENOSCOPY (EGD) WITH PROPOFOL;  Surgeon: Napoleon Form, MD;  Location: [facility] ENDOSCOPY;  Service: Gastroenterology;  Laterality: N/A;     A IV Location/Drains/Wounds Patient Lines/Drains/Airways Status     Active Line/Drains/Airways     Name Placement date Placement time Site Days   Peripheral IV 12/25/22 18 G Left Antecubital 12/25/22  1621  Antecubital  less than 1   Peripheral IV 12/25/22 18 G Anterior;Right Forearm 12/25/22  1723  Forearm  less than 1            Intake/Output Last 24 hours No intake or output data in the 24 hours ending 12/25/22 1919  Labs/Imaging Results for orders placed or performed during the hospital encounter of 12/25/22 (from the past 48 hour(s))  Type and screen [facility]     Status: None (Preliminary result)   Collection Time: 12/25/22  4:05 PM  Result Value Ref Range   ABO/RH(D) O POS    Antibody Screen NEG    Sample Expiration 12/28/2022,2359    Unit Number [id number]    Blood Component Type RED CELLS,LR    Unit division 00    Status of Unit ISSUED    Transfusion Status OK  TO TRANSFUSE    Crossmatch Result      Compatible Performed at [facility], [address], [place], [place] [address]   Comprehensive metabolic panel     Status: Abnormal   Collection Time: 12/25/22  4:08 PM  Result Value Ref Range   Sodium 135 135 - 145 mmol/L   Potassium 4.5 3.5 - 5.1 mmol/L   Chloride 103 98 - 111 mmol/L   CO2 22 22 - 32 mmol/L   Glucose, Bld 103 (H) 70 - 99 mg/dL    Comment: Glucose reference range applies only to samples taken after fasting for at least 8 hours.   BUN 28 (H) 6 - 20 mg/dL    Creatinine, Ser 4.09 0.61 - 1.24 mg/dL   Calcium 8.4 (L) 8.9 - 10.3 mg/dL   Total Protein 5.4 (L) 6.5 - 8.1 g/dL   Albumin 2.4 (L) 3.5 - 5.0 g/dL   AST 66 (H) 15 - 41 U/L   ALT 36 0 - 44 U/L   Alkaline Phosphatase 108 38 - 126 U/L   Total Bilirubin 2.7 (H) 0.3 - 1.2 mg/dL   GFR, Estimated >81 >19 mL/min    Comment: (NOTE) Calculated using the CKD-EPI Creatinine Equation (2021)    Anion gap 10 5 - 15    Comment: Performed at [facility], [address], [place], [place] [address]  CBC     Status: Abnormal   Collection Time: 12/25/22  4:08 PM  Result Value Ref Range   WBC 6.6 4.0 - 10.5 K/uL   RBC 2.75 (L) 4.22 - 5.81 MIL/uL   Hemoglobin 7.0 (L) 13.0 - 17.0 g/dL    Comment: REPEATED TO VERIFY   HCT 23.9 (L) 39.0 - 52.0 %   MCV 86.9 80.0 - 100.0 fL   MCH 25.5 (L) 26.0 - 34.0 pg   MCHC 29.3 (L) 30.0 - 36.0 g/dL   RDW 95.6 (H) 21.3 - 08.6 %   Platelets 176 150 - 400 K/uL    Comment: REPEATED TO VERIFY   nRBC 0.5 (H) 0.0 - 0.2 %    Comment: Performed at [facility], [address], [place], [place] [address]  Protime-INR     Status: Abnormal   Collection Time: 12/25/22  4:19 PM  Result Value Ref Range   Prothrombin Time 18.3 (H) 11.4 - 15.2 seconds   INR 1.5 (H) 0.8 - 1.2    Comment: (NOTE) INR goal varies based on device and disease states. Performed at [facility], [address], [place], [place] [address]   Prepare RBC (crossmatch)     Status: None   Collection Time: 12/25/22  5:54 PM  Result Value Ref Range   Order Confirmation      ORDER PROCESSED BY BLOOD BANK Performed at [facility], [address] [place], [place] [address]    No results found.  Pending Labs Unresulted Labs (From admission, onward)     Start     Ordered   12/26/22 0500  Comprehensive metabolic panel  Tomorrow morning,   R        12/25/22 1753   12/26/22 0500  Protime-INR  Tomorrow morning,   R        12/25/22 1753   12/25/22 1800  CBC  Now then every 6 hours,    R (with TIMED occurrences)      12/25/22 1753   12/25/22 1630  Resp panel by RT-PCR (RSV, Flu  A&B, Covid) Anterior Nasal Swab  Once,   URGENT        12/25/22 1631            Vitals/Pain Today's Vitals   12/25/22 1617 12/25/22 1721 12/25/22 1825 12/25/22 1845  BP:   (!) 114/55 (!) 109/48  Pulse:   (!) 112 (!) 112  Resp:   18 18  Temp:   98.3 F (36.8 C) 98.5 F (36.9 C)  TempSrc:   Oral Oral  SpO2:   100% 99%  Weight:      Height:      PainSc: 10-Worst pain ever 9       Isolation Precautions No active isolations  Medications Medications  octreotide (SANDOSTATIN) 2 mcg/mL load via infusion 50 mcg (50 mcg Intravenous Bolus from Bag 12/25/22 1759)    And  octreotide (SANDOSTATIN) 500 mcg in sodium chloride 0.9 % 250 mL (2 mcg/mL) infusion (50 mcg/hr Intravenous New Bag/Given 12/25/22 1759)  propranolol (INDERAL) tablet 20 mg (has no administration in time range)  sodium chloride flush (NS) 0.9 % injection 3 mL (has no administration in time range)  acetaminophen (TYLENOL) tablet 650 mg (has no administration in time range)    Or  acetaminophen (TYLENOL) suppository 650 mg (has no administration in time range)  0.9 %  sodium chloride infusion (Manually program via Guardrails IV Fluids) (has no administration in time range)  ondansetron (ZOFRAN) injection 4 mg (has no administration in time range)  lactated ringers bolus 1,000 mL (1,000 mLs Intravenous New Bag/Given 12/25/22 1657)  ondansetron (ZOFRAN) injection 4 mg (4 mg Intravenous Given 12/25/22 1643)  morphine (PF) 4 MG/ML injection 4 mg (4 mg Intravenous Given 12/25/22 1643)  cefTRIAXone (ROCEPHIN) 1 g in sodium chloride 0.9 % 100 mL IVPB (0 g Intravenous Stopped 12/25/22 1745)  pantoprazole (PROTONIX) injection 40 mg (40 mg Intravenous Given 12/25/22 1713)    Mobility walks with person assist     Focused Assessments    R Recommendations: See Admitting Provider Note  Report given to:   Additional Notes:

## 2022-12-25 NOTE — ED Provider Notes (Signed)
[place] EMERGENCY DEPARTMENT AT [facility] Provider Note  MDM   HPI/ROS:  Thomas [name] is a 47 y.o. male with a medical history as below who presents for evaluation of nausea and vomiting.  He was seen at this hospital approximately 3 days ago, and underwent a procedural sedation for reduction of his bimalleolar fracture on his right foot.  Since then he has been experiencing some nausea and vomiting.  The first episode happened yesterday when he was taking a Percocet that had been prescribed for him.  Today he has had increasing nausea and vomiting.  He reports that he has seen some dark red streaks in it that he is concerned may be blood.  Also reports that he does have a history of an ulcer.  Denies any fevers, shortness of breath, dizziness, weakness, or any other complaints at this time.  No nausea or vomiting.  Physical exam is notable for: - Pale but otherwise normal physical exam  On my initial evaluation, patient is:  -Vital signs stable, though marginally tachycardic.  Patient afebrile, hemodynamically stable, and non-toxic appearing. -Additional history obtained from father at bedside  This patient's current presentation, including their history and physical exam, is most consistent with hematemesis and blood loss anemia. Differentials include lower GI bleed though this is less likely, peptic ulcer disease which is also less likely.  Patient appears pale, though vital signs are reassuring he remains tachycardic.  Precipitous drop in hemoglobin from last visit, from 11 down to 7.  This correlates with his reports of hematemesis and his known esophageal varices.  Will plan to establish good IV access, give him Protonix and Rocephin and octreotide.  GI consult and likely admission.  Considered the need for CT though in the setting of known esophageal varices and hematemesis, suspect most likely upper GI without any lower GI symptoms and the patient does not have any abdominal  pain at this time.   Interpretations, interventions, and the patient's course of care are documented below.    Clinical Course as of 12/25/22 2152  Thu Dec 25, 2022  1638 Hemoglobin(!): 7.0 [BB]    Clinical Course User Index [BB] Fayrene Helper, MD      Disposition:  I discussed the case with hospitalist who graciously agreed to admit the patient to their service for continued care.   Clinical Impression: No diagnosis found.  Rx / DC Orders ED Discharge Orders     None       The plan for this patient was discussed with Dr. Jacqulyn Bath, who voiced agreement and who oversaw evaluation and treatment of this patient.   Clinical Complexity A medically appropriate history, review of systems, and physical exam was performed.  My independent interpretations of EKG, labs, and radiology are documented in the ED course above.   If decision rules were used in this patient's evaluation, they are listed below.   Click here for ABCD2, HEART and other calculatorsREFRESH Note before signing   Patient's presentation is most consistent with acute presentation with potential threat to life or bodily function.  Medical Decision Making Amount and/or Complexity of Data Reviewed Independent Historian: parent External Data Reviewed: labs and notes.    Details: Labs from prior hospitalization in January 2024, notes from similar as well as ED visit 7/15. Labs: ordered. Decision-making details documented in ED Course. ECG/medicine tests: ordered. Discussion of management or test interpretation with external provider(s): Discussed his case with [facility]  Risk OTC drugs. Prescription drug management. Parenteral  controlled substances. Drug therapy requiring intensive monitoring for toxicity. Decision regarding hospitalization.    HPI/ROS      See MDM section for pertinent HPI and ROS. A complete ROS was performed with pertinent positives/negatives noted above.   Past Medical  History:  Diagnosis Date   Alcoholic fatty liver 01/16/2010   Needs final HBV and HAV vaccines on or after 10/25/2012    Alcoholism (HCC) 12/25/2011   Allergic rhinitis    Childhood asthma    Elevated transaminase level 06/10/2007   AST: 80 ALT: 136 in 8/11: Hepatitis A., B and C negative.    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    Sleep apnea     Past Surgical History:  Procedure Laterality Date   ESOPHAGOGASTRODUODENOSCOPY (EGD) WITH PROPOFOL N/A 07/05/2022   Procedure: ESOPHAGOGASTRODUODENOSCOPY (EGD) WITH PROPOFOL;  Surgeon: Napoleon Form, MD;  Location: [facility] END[facility];  Service: Gastroenterology;  Laterality: N/A;      Physical Exam   Vitals:   12/25/22 1551 12/25/22 1552  BP: (!) 141/73   Pulse: (!) 118   Resp: 18   Temp: 98.6 F (37 C)   TempSrc: Oral   SpO2: 99%   Weight:  129.3 kg  Height:  5\' 5"  (1.651 m)    Physical Exam Vitals and nursing note reviewed.  Constitutional:      General: He is not in acute distress.    Appearance: He is well-developed. He is obese. He is ill-appearing. He is not toxic-appearing or diaphoretic.  HENT:     Head: Normocephalic and atraumatic.  Eyes:     Conjunctiva/sclera: Conjunctivae normal.  Cardiovascular:     Rate and Rhythm: Normal rate and regular rhythm.     Heart sounds: No murmur heard. Pulmonary:     Effort: Pulmonary effort is normal. No respiratory distress.     Breath sounds: Normal breath sounds.  Abdominal:     Palpations: Abdomen is soft.     Tenderness: There is no abdominal tenderness.  Musculoskeletal:        General: No swelling.     Cervical back: Neck supple.     Comments: Splint in place in the right foot  Skin:    General: Skin is warm and dry.     Capillary Refill: Capillary refill takes less than 2 seconds.     Coloration: Skin is pale.  Neurological:     Mental Status: He is alert and oriented to person, place, and time.  Psychiatric:         Mood and Affect: Mood normal.      Procedures   If procedures were preformed on this patient, they are listed below:  Procedures   Fayrene Helper, MD Emergency Medicine PGY-2   Please note that this documentation was produced with the assistance of voice-to-text technology and may contain errors.    Fayrene Helper, MD 12/25/22 2155    Maia Plan, MD 12/29/22 1010

## 2022-12-25 NOTE — ED Notes (Signed)
Only able to obtain CBC and BMP, CMP. Stuck patient twice.

## 2022-12-25 NOTE — ED Notes (Signed)
No adverse reaction 15 minutes post blood admin start.

## 2022-12-25 NOTE — ED Triage Notes (Signed)
Pt c/o hematemesis started at 0400 today. Pt states it dark red blood. Pt is pale/jaundice. Pt c/o dizziness and weakness.

## 2022-12-25 NOTE — Transitions of Care (Post Inpatient/ED Visit) (Signed)
@  [id number]@  12/25/2022  Name: DUSTY RACZKOWSKI MRN: [id number] DOB: Jun 14, 1975  Today's TOC FU Call Status: Today's TOC FU Call Status:: Unsuccessul Call (1st Attempt) Unsuccessful Call (1st Attempt) Date: 12/25/22  Attempted to reach the patient regarding the most recent Inpatient/ED visit.  Follow Up Plan: Additional outreach attempts will be made to reach the patient to complete the Transitions of Care (Post Inpatient/ED visit) call.   Signature ,cma

## 2022-12-25 NOTE — H&P (Signed)
History and Physical   Thomas [name] YQM:[id number] DOB: 31-Jul-1975 DOA: 12/25/2022  PCP: [name], MD   Patient coming from: Home  Chief Complaint: Nausea, Vomiting, Hematemesis  HPI: Thomas [name] is a 47 y.o. male with medical history significant of cirrhosis, varices, alcohol use, obesity, OSA, anemia, psoriatic arthritis presenting with nausea vomiting and hematemesis.  Patient was recently seen in the ED for a right foot fracture which was reduced under sedation.  States he has had some nausea vomiting since going home.  Has first significant episode of vomiting yesterday with taking his Percocet.  Today he has had increasing episodes of nausea and vomiting and noticed some dark red streaks.  Does have history of varices as above.  Denies fevers, chills, chest pain, shortness of breath, abdominal pain, constipation, diarrhea.   ED Course: Vital signs in the ED notable for blood pressure in the 120s to 140 systolic.  Heart rate in the 110s.  Lab workup included CMP with BUN 28, glucose 103, calcium 8.4, protein 5.4, albumin 2.4, AST stable at 66, T. bili stable at 2.7.  CBC showed hemoglobin of 7 down from 11 4 months ago, platelets 176, PT 18.3, INR 1.5.  FOBT pending.  Respiratory panel for flu COVID RSV pending.  Type and screen in the ED.  CTA bleed study ordered.  Patient received morphine, Zofran, ceftriaxone, IV PPI, 1 L IV fluids, octreotide infusion in the ED.  Gastroenterology consulted and will review chart and see patient per EDP.  Review of Systems: As per HPI otherwise all other systems reviewed and are negative.  Past Medical History:  Diagnosis Date   Acute conjunctivitis of both eyes 10/22/2021   Acute sinusitis 06/16/2013   Alcoholic fatty liver 01/16/2010   Needs final HBV and HAV vaccines on or after 10/25/2012    Alcoholism (HCC) 12/25/2011   Allergic rhinitis    Childhood asthma    Elevated transaminase level 06/10/2007   AST: 80 ALT: 136 in  8/11: Hepatitis A., B and C negative.    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    Sleep apnea     Past Surgical History:  Procedure Laterality Date   ESOPHAGOGASTRODUODENOSCOPY (EGD) WITH PROPOFOL N/A 07/05/2022   Procedure: ESOPHAGOGASTRODUODENOSCOPY (EGD) WITH PROPOFOL;  Surgeon: Napoleon Form, MD;  Location: [facility] ENDOSCOPY;  Service: Gastroenterology;  Laterality: N/A;    Social History  reports that he has been smoking cigars. He has never used smokeless tobacco. He reports that he does not currently use alcohol. He reports that he does not use drugs.  Allergies  Allergen Reactions   Codeine Other (See Comments)    Jittery     Family History  Problem Relation Age of Onset   Liver disease Mother    Depression Mother    Liver disease Father    Diabetes Father    Hypertension Father    Liver disease Sister    Diabetes Other    Colon cancer Neg Hx    Esophageal cancer Neg Hx    Stomach cancer Neg Hx    Colon polyps Neg Hx   Reviewed on admission  Prior to Admission medications   Medication Sig Start Date End Date Taking? Authorizing Provider  buPROPion (WELLBUTRIN XL) 150 MG 24 hr tablet Take 150 mg by mouth daily.  Yes [provider]  ibuprofen (ADVIL) 200 MG tablet Take 600 mg by mouth every 4 (four) hours as needed for moderate pain.   Yes [provider]  oxyCODONE-acetaminophen (PERCOCET/ROXICET) 5-325 MG tablet Take 1 tablet by mouth every 6 (six) hours as needed for up to 5 days for severe pain. 12/22/22 12/27/22 Yes Fayrene Helper, MD  propranolol (INDERAL) 20 MG tablet TAKE 1 TABLET (20 MG TOTAL) BY MOUTH 2 (TWO) TIMES DAILY. TAKE 20MG  TWICE DAILY. GOAL IS A RESTING HEART RATE OF 55-60. 10/01/22  Yes [name], MD  cyclobenzaprine (FLEXERIL) 5 MG tablet Take 1 tablet (5 mg  total) by mouth 3 (three) times daily as needed for muscle spasms. Patient not taking: Reported on 12/25/2022 12/22/22   Fayrene Helper, MD    Physical Exam: Vitals:   12/25/22 1551 12/25/22 1552  BP: (!) 141/73   Pulse: (!) 118   Resp: 18   Temp: 98.6 F (37 C)   TempSrc: Oral   SpO2: 99%   Weight:  129.3 kg  Height:  5\' 5"  (1.651 m)    Physical Exam Constitutional:      General: He is not in acute distress.    Appearance: Normal appearance. He is obese.  HENT:     Head: Normocephalic and atraumatic.     Mouth/Throat:     Mouth: Mucous membranes are moist.     Pharynx: Oropharynx is clear.  Eyes:     Extraocular Movements: Extraocular movements intact.     Pupils: Pupils are equal, round, and reactive to light.  Cardiovascular:     Rate and Rhythm: Normal rate and regular rhythm.     Pulses: Normal pulses.     Heart sounds: Normal heart sounds.  Pulmonary:     Effort: Pulmonary effort is normal. No respiratory distress.     Breath sounds: Normal breath sounds.  Abdominal:     General: Bowel sounds are normal. There is no distension.     Palpations: Abdomen is soft.     Tenderness: There is no abdominal tenderness.  Musculoskeletal:        General: No swelling or deformity.  Skin:    General: Skin is warm and dry.  Neurological:     General: No focal deficit present.     Mental Status: Mental status is at baseline.    Labs on Admission: I have personally reviewed following labs and imaging studies  CBC: Recent Labs  Lab 12/25/22 1608  WBC 6.6  HGB 7.0*  HCT 23.9*  MCV 86.9  PLT 176    Basic Metabolic Panel: Recent Labs  Lab 12/25/22 1608  NA 135  K 4.5  CL 103  CO2 22  GLUCOSE 103*  BUN 28*  CREATININE 0.82  CALCIUM 8.4*    GFR: Estimated Creatinine Clearance: 142.6 mL/min (by C-G formula based on SCr of 0.82 mg/dL).  Liver Function Tests: Recent Labs  Lab 12/25/22 1608  AST 66*  ALT 36  ALKPHOS 108  BILITOT 2.7*  PROT 5.4*   ALBUMIN 2.4*    Urine analysis:    Component Value Date/Time   COLORURINE AMBER (A) 08/01/2021 2031   APPEARANCEUR CLEAR 08/01/2021 2031   LABSPEC 1.032 (H) 08/01/2021 2031   PHURINE 5.0 08/01/2021 2031   GLUCOSEU NEGATIVE 08/01/2021 2031   GLUCOSEU NEG mg/dL 30/86/5784 6962   HGBUR NEGATIVE 08/01/2021 2031   BILIRUBINUR NEGATIVE 08/01/2021 2031   KETONESUR NEGATIVE 08/01/2021 2031   PROTEINUR 30 (A) 08/01/2021 2031  UROBILINOGEN 1 10/12/2007 2022   NITRITE NEGATIVE 08/01/2021 2031   LEUKOCYTESUR NEGATIVE 08/01/2021 2031    Radiological Exams on Admission: No results found.  EKG: Independently reviewed.  Sinus tachycardia at 110 bpm.  Assessment/Plan Active Problems:   Alcoholism (HCC)   OSA (obstructive sleep apnea)   Esophageal varices in alcoholic cirrhosis (HCC)   Class 3 severe obesity due to excess calories with body mass index (BMI) of 50.0 to 59.9 in adult [facility])   Normocytic anemia   Psoriatic arthritis (HCC)   Cirrhosis of liver (HCC)   Acute GI bleed Anemia Concern for variceal bleed History of cirrhosis secondary to alcohol use > Known history of cirrhosis in the setting of alcohol use with history of varices per chart. > Has had nausea vomiting for 2 days increasing the last day.  Has noticed some red streaks. > Hemoglobin noted to be 7 down from 11 4 months ago.  Elevated BUN to 28 with normal creatinine.  FOBTpending.  Concerning for upper GI bleed and possible variceal bleed. > Given IV PPI and started on octreotide in the ED. > GI consulted in the ED and  - Appreciate GI recommendations - Monitor on progressive unit - Transfuse 1 unit - [place] Hgb - Continue octreotide gtt - Continue home propranolol  Obesity OSA - Hold off on CPAP with ongoing nausea, vomiting, hematemesis  History of psoriasis and psoriatic arthritis - Noted  DVT prophylaxis: SCDs Code Status:   Full Family Communication:  None on admission  Disposition Plan:    Patient is from:  Home  Anticipated DC to:  Home  Anticipated DC date:  2 to 4 days  Anticipated DC barriers: None  Consults called:  Gastroenterology Admission status:  Inpatient, progressive  Severity of Illness: The appropriate patient status for this patient is INPATIENT. Inpatient status is judged to be reasonable and necessary in order to provide the required intensity of service to ensure the patient's safety. The patient's presenting symptoms, physical exam findings, and initial radiographic and laboratory data in the context of their chronic comorbidities is felt to place them at high risk for further clinical deterioration. Furthermore, it is not anticipated that the patient will be medically stable for discharge from the hospital within 2 midnights of admission.   * I certify that at the point of admission it is my clinical judgment that the patient will require inpatient hospital care spanning beyond 2 midnights from the point of admission due to high intensity of service, high risk for further deterioration and high frequency of surveillance required.Synetta Fail MD [facility]  How to contact the [facility] Attending or Consulting provider 7A - 7P or covering provider during after hours 7P -7A, for this patient?   Check the care team in [facility] and look for a) attending/consulting [facility] provider listed and b) the [facility] team listed Log into [email or web address] and use [place]'s universal password to access. If you do not have the password, please contact the hospital operator. Locate the [facility] provider you are looking for under [facility] and page to a number that you can be directly reached. If you still have difficulty reaching the provider, please page the [facility] (Director on Call) for the Hospitalists listed on amion for assistance.  12/25/2022, 5:44 PM

## 2022-12-25 NOTE — ED Notes (Signed)
Patient c/oRLE pain 10/10, MD made aware via secure chat

## 2022-12-26 ENCOUNTER — Encounter (HOSPITAL_COMMUNITY)
Admission: EM | Disposition: A | Discharge status: Home / Self Care | Payer: Self-pay | Source: Home / Self Care | Attending: Internal Medicine

## 2022-12-26 ENCOUNTER — Inpatient Hospital Stay (HOSPITAL_COMMUNITY): Payer: BC Managed Care – PPO | Admitting: Anesthesiology

## 2022-12-26 ENCOUNTER — Encounter (HOSPITAL_COMMUNITY): Payer: Self-pay | Admitting: Internal Medicine

## 2022-12-26 DIAGNOSIS — K922 Gastrointestinal hemorrhage, unspecified: Secondary | ICD-10-CM | POA: Diagnosis not present

## 2022-12-26 DIAGNOSIS — I8501 Esophageal varices with bleeding: Secondary | ICD-10-CM | POA: Diagnosis not present

## 2022-12-26 DIAGNOSIS — K920 Hematemesis: Secondary | ICD-10-CM | POA: Diagnosis not present

## 2022-12-26 DIAGNOSIS — K7460 Unspecified cirrhosis of liver: Secondary | ICD-10-CM | POA: Diagnosis not present

## 2022-12-26 DIAGNOSIS — I8511 Secondary esophageal varices with bleeding: Secondary | ICD-10-CM

## 2022-12-26 HISTORY — PX: ESOPHAGOGASTRODUODENOSCOPY (EGD) WITH PROPOFOL: SHX5813

## 2022-12-26 HISTORY — PX: ESOPHAGEAL BANDING: SHX5518

## 2022-12-26 LAB — COMPREHENSIVE METABOLIC PANEL
ALT: 31 U/L (ref 0–44)
AST: 51 U/L — ABNORMAL HIGH (ref 15–41)
Albumin: 2.2 g/dL — ABNORMAL LOW (ref 3.5–5.0)
Alkaline Phosphatase: 81 U/L (ref 38–126)
Anion gap: 3 — ABNORMAL LOW (ref 5–15)
BUN: 27 mg/dL — ABNORMAL HIGH (ref 6–20)
CO2: 28 mmol/L (ref 22–32)
Calcium: 8.3 mg/dL — ABNORMAL LOW (ref 8.9–10.3)
Chloride: 105 mmol/L (ref 98–111)
Creatinine, Ser: 0.92 mg/dL (ref 0.61–1.24)
GFR, Estimated: >60 mL/min (ref >60)
Glucose, Bld: 102 mg/dL — ABNORMAL HIGH (ref 70–99)
Potassium: 4.9 mmol/L (ref 3.5–5.1)
Sodium: 136 mmol/L (ref 135–145)
Total Bilirubin: 2.6 mg/dL — ABNORMAL HIGH (ref 0.3–1.2)
Total Protein: 4.8 g/dL — ABNORMAL LOW (ref 6.5–8.1)

## 2022-12-26 LAB — PREPARE RBC (CROSSMATCH)

## 2022-12-26 LAB — TYPE AND SCREEN
Unit division: 0
Unit division: 0
Unit division: 0

## 2022-12-26 LAB — PROTIME-INR
INR: 1.5 — ABNORMAL HIGH (ref 0.8–1.2)
Prothrombin Time: 18 seconds — ABNORMAL HIGH (ref 11.4–15.2)

## 2022-12-26 LAB — CBC
HCT: 22.2 % — ABNORMAL LOW (ref 39.0–52.0)
HCT: 23.2 % — ABNORMAL LOW (ref 39.0–52.0)
Hemoglobin: 6.6 g/dL — CL (ref 13.0–17.0)
Hemoglobin: 7.4 g/dL — ABNORMAL LOW (ref 13.0–17.0)
MCH: 24.8 pg — ABNORMAL LOW (ref 26.0–34.0)
MCH: 28.1 pg (ref 26.0–34.0)
MCHC: 29.7 g/dL — ABNORMAL LOW (ref 30.0–36.0)
MCHC: 31.9 g/dL (ref 30.0–36.0)
MCV: 83.5 fL (ref 80.0–100.0)
MCV: 88.2 fL (ref 80.0–100.0)
Platelets: 152 10*3/uL (ref 150–400)
Platelets: 128 10*3/uL — ABNORMAL LOW (ref 150–400)
RBC: 2.66 MIL/uL — ABNORMAL LOW (ref 4.22–5.81)
RBC: 2.63 MIL/uL — ABNORMAL LOW (ref 4.22–5.81)
RDW: 19.8 % — ABNORMAL HIGH (ref 11.5–15.5)
RDW: 21.5 % — ABNORMAL HIGH (ref 11.5–15.5)
WBC: 7.5 10*3/uL (ref 4.0–10.5)
WBC: 4.9 10*3/uL (ref 4.0–10.5)
nRBC: 0.3 % — ABNORMAL HIGH (ref 0.0–0.2)
nRBC: 0 % (ref 0.0–0.2)

## 2022-12-26 LAB — BPAM RBC
ISSUE DATE / TIME: 202407191114
Unit Type and Rh: 5100
Unit Type and Rh: 5100

## 2022-12-26 SURGERY — ESOPHAGOGASTRODUODENOSCOPY (EGD) WITH PROPOFOL
Anesthesia: Monitor Anesthesia Care

## 2022-12-26 MED ORDER — PANTOPRAZOLE SODIUM 40 MG IV SOLR
40.0000 mg | Freq: Two times a day (BID) | INTRAVENOUS | Status: DC
  Administered 2022-12-26 – 2022-12-29 (×5): 40 mg via INTRAVENOUS
  Filled 2022-12-26 (×5): qty 10

## 2022-12-26 MED ORDER — OXYCODONE HCL 5 MG PO TABS
ORAL_TABLET | ORAL | Status: AC
  Filled 2022-12-26: qty 1

## 2022-12-26 MED ORDER — PHENYLEPHRINE HCL-NACL 20-0.9 MG/250ML-% IV SOLN: INTRAVENOUS | Status: DC | PRN

## 2022-12-26 MED ORDER — EPHEDRINE SULFATE-NACL 50-0.9 MG/10ML-% IV SOSY
PREFILLED_SYRINGE | INTRAVENOUS | Status: DC | PRN
  Administered 2022-12-26 (×2): 10 mg via INTRAVENOUS

## 2022-12-26 MED ORDER — LACTATED RINGERS IV SOLN
INTRAVENOUS | Status: AC | PRN
  Administered 2022-12-26: 20 mL/h via INTRAVENOUS

## 2022-12-26 MED ORDER — SODIUM CHLORIDE 0.9% IV SOLUTION: Freq: Once | INTRAVENOUS | Status: DC

## 2022-12-26 MED ORDER — SODIUM CHLORIDE 0.9 % IV SOLN: INTRAVENOUS | Status: DC

## 2022-12-26 MED ORDER — LIDOCAINE 2% (20 MG/ML) 5 ML SYRINGE
INTRAMUSCULAR | Status: DC | PRN
  Administered 2022-12-26: 40 mg via INTRAVENOUS

## 2022-12-26 MED ORDER — PROPOFOL 10 MG/ML IV BOLUS
INTRAVENOUS | Status: DC | PRN
  Administered 2022-12-26: 40 mg via INTRAVENOUS

## 2022-12-26 MED ORDER — ALBUMIN HUMAN 5 % IV SOLN: INTRAVENOUS | Status: DC | PRN

## 2022-12-26 MED ORDER — PROPOFOL 500 MG/50ML IV EMUL
INTRAVENOUS | Status: DC | PRN
  Administered 2022-12-26: 100 ug/kg/min via INTRAVENOUS

## 2022-12-26 MED ORDER — PHENYLEPHRINE HCL-NACL 20-0.9 MG/250ML-% IV SOLN
INTRAVENOUS | Status: DC | PRN
  Administered 2022-12-26: 60 ug/min via INTRAVENOUS

## 2022-12-26 MED ORDER — PHENYLEPHRINE 80 MCG/ML (10ML) SYRINGE FOR IV PUSH (FOR BLOOD PRESSURE SUPPORT)
PREFILLED_SYRINGE | INTRAVENOUS | Status: DC | PRN
  Administered 2022-12-26: 240 ug via INTRAVENOUS
  Administered 2022-12-26: 180 ug via INTRAVENOUS
  Administered 2022-12-26: 240 ug via INTRAVENOUS

## 2022-12-26 SURGICAL SUPPLY — 15 items

## 2022-12-26 NOTE — Transfer of Care (Signed)
Immediate Anesthesia Transfer of Care Note  Patient: Thomas [name]  Procedure(s) Performed: ESOPHAGOGASTRODUODENOSCOPY (EGD) WITH PROPOFOL  Patient Location: PACU  Anesthesia Type:MAC  Level of Consciousness: awake and alert   Airway & Oxygen Therapy: Patient Spontanous Breathing and Patient connected to nasal cannula oxygen  Post-op Assessment: Report given to RN and patient remains on 88mcg/min of Phenylephrine gtt. Awaiting labs, pt will probably require another unit of blood. Pt resting comfortably in bed.   Post vital signs: Reviewed  Last Vitals:  Vitals Value Taken Time  BP 98/40 12/26/22 1620  Temp    Pulse 80 12/26/22 1620  Resp 14 12/26/22 1620  SpO2 100 % 12/26/22 1620  Vitals shown include unfiled device data.  Last Pain:  Vitals:   12/26/22 1645  TempSrc:   PainSc: 10-Worst pain ever      Patients Stated Pain Goal: 0 (12/26/22 1645)  Complications: No notable events documented.

## 2022-12-26 NOTE — Interval H&P Note (Signed)
History and Physical Interval Note:  12/26/2022 2:11 PM  Thomas [name]  has presented today for surgery, with the diagnosis of GI  bleed, esophageal varices.  The various methods of treatment have been discussed with the patient and family. After consideration of risks, benefits and other options for treatment, the patient has consented to  Procedure(s): ESOPHAGOGASTRODUODENOSCOPY (EGD) WITH PROPOFOL (N/A) as a surgical intervention.  The patient's history has been reviewed, patient examined, no change in status, stable for surgery.  I have reviewed the patient's chart and labs.  Questions were answered to the patient's satisfaction.      

## 2022-12-26 NOTE — Progress Notes (Signed)
PROGRESS NOTE    Thomas [name]  ZOX:[id number] DOB: [birth year] DOA: 12/25/2022 PCP: [name], MD   Brief Narrative: Thomas [name] is a 47 y.o. male with a history of cirrhosis, varices, portal hypertension, obesity, OSA, psoriatic arthritis.  Patient presented secondary to Nausea, vomiting and hematemesis concerning for upper GI bleeding related to known varices. GI consulted. Patient transfused blood as needed.   Assessment and Plan:  Hematemesis Acute GI bleeding Patient has a history of varices with concern for possible variceal bleeding. Patient is also prescribed ibuprofen but cannot tell me if he has been using it. Hematemesis has now appeared to have resolved. GI consulted and plan for upper endoscopy today. -Protonix -Octreotide -GI recommendations  Acute blood loss anemia Secondary to acute blood loss from GI bleeding. Baseline hemoglobin of 11. Hemoglobin of 7.0 on admission requiring 1 unit of PRBC. Hemoglobin down to 6.6 today after initial unit of PRBC requiring another unit of PRBC. -Recheck H&H post-transfusion -CBC in AM  Right ankle fracture Diagnosed prior to admission. Required closed reduction in the ED with referral to orthopedic surgery for evaluation. Patient referred to Dr. Myrene Galas. Patient currently non weight bearing to right lower extremity. Patient requesting inpatient orthopedic surgery evaluation; ortho contacted for consult  Alcoholic cirrhosis Portal hypertension Noted. -Continue Propranolol and Protonix  OSA Noted. CPAP held secondary to nausea/vomiting  Psoriasis Psoriatic arthritis Not on treatment.  Obesity Estimated body mass index is 47.43 kg/m as calculated from the following:   Height as of this encounter: 5\' 5"  (1.651 m).   Weight as of this encounter: 129.3 kg.  DVT prophylaxis: SCDs Code Status:   Code Status: Full Code Family Communication: None Disposition Plan: Discharge home likely in 1-2 days  pending GI recommendations   Consultants:  [facility]  Procedures:  None  Antimicrobials: None    Subjective: No emesis overnight. No abdominal pain  Objective: BP (!) 124/53 (BP Location: Right Leg)   Pulse 68   Temp 98 F (36.7 C) (Oral)   Resp 15   Ht 5\' 5"  (1.651 m)   Wt 128.9 kg   SpO2 93%   BMI 47.29 kg/m   Examination:  General exam: Appears calm and comfortable Respiratory system: Clear to auscultation. Respiratory effort normal. Cardiovascular system: S1 & S2 heard, RRR. No murmurs, rubs, gallops or clicks. Gastrointestinal system: Abdomen is nondistended, soft and nontender. Normal bowel sounds heard. Central nervous system: Alert and oriented. No focal neurological deficits. Musculoskeletal: Right ankle in splint Skin: No cyanosis. No rashes Psychiatry: Judgement and insight appear normal. Mood & affect appropriate.    Data Reviewed: I have personally reviewed following labs and imaging studies  CBC Lab Results  Component Value Date   WBC 7.5 12/26/2022   RBC 2.66 (L) 12/26/2022   HGB 6.6 (LL) 12/26/2022   HCT 22.2 (L) 12/26/2022   MCV 83.5 12/26/2022   MCH 24.8 (L) 12/26/2022   PLT 152 12/26/2022   MCHC 29.7 (L) 12/26/2022   RDW 19.8 (H) 12/26/2022   LYMPHSABS 2.0 08/01/2021   MONOABS 1.1 (H) 08/01/2021   EOSABS 0.0 08/01/2021   BASOSABS 0.0 08/01/2021     Last metabolic panel Lab Results  Component Value Date   NA 136 12/26/2022   K 4.9 12/26/2022   CL 105 12/26/2022   CO2 28 12/26/2022   BUN 27 (H) 12/26/2022   CREATININE 0.92 12/26/2022   GLUCOSE 102 (H) 12/26/2022   GFRNONAA >60 12/26/2022   GFRAA >89  06/22/2013   CALCIUM 8.3 (L) 12/26/2022   PHOS 3.1 07/05/2022   PROT 4.8 (L) 12/26/2022   ALBUMIN 2.2 (L) 12/26/2022   BILITOT 2.6 (H) 12/26/2022   ALKPHOS 81 12/26/2022   AST 51 (H) 12/26/2022   ALT 31 12/26/2022   ANIONGAP 3 (L) 12/26/2022    GFR: Estimated Creatinine Clearance: 126.9 mL/min (by C-G formula based on  SCr of 0.92 mg/dL).  Recent Results (from the past 240 hour(s))  Resp panel by RT-PCR (RSV, Flu A&B, Covid) Anterior Nasal Swab     Status: None   Collection Time: 12/25/22  4:30 PM   Specimen: Anterior Nasal Swab  Result Value Ref Range Status   SARS Coronavirus 2 by RT PCR NEGATIVE NEGATIVE Final   Influenza A by PCR NEGATIVE NEGATIVE Final   Influenza B by PCR NEGATIVE NEGATIVE Final    Comment: (NOTE) The Xpert Xpress SARS-CoV-2/FLU/RSV plus assay is intended as an aid in the diagnosis of influenza from Nasopharyngeal swab specimens and should not be used as a sole basis for treatment. Nasal washings and aspirates are unacceptable for Xpert Xpress SARS-CoV-2/FLU/RSV testing.  Fact Sheet for Patients: BloggerCourse.com  Fact Sheet for Healthcare Providers: SeriousBroker.it  This test is not yet approved or cleared by the [place] FDA and has been authorized for detection and/or diagnosis of SARS-CoV-2 by FDA under an Emergency Use Authorization (EUA). This EUA will remain in effect (meaning this test can be used) for the duration of the COVID-19 declaration under Section 564(b)(1) of the Act, 21 U.S.C. section 360bbb-3(b)(1), unless the authorization is terminated or revoked.     Resp Syncytial Virus by PCR NEGATIVE NEGATIVE Final    Comment: (NOTE) Fact Sheet for Patients: BloggerCourse.com  Fact Sheet for Healthcare Providers: SeriousBroker.it  This test is not yet approved or cleared by the [place] FDA and has been authorized for detection and/or diagnosis of SARS-CoV-2 by FDA under an Emergency Use Authorization (EUA). This EUA will remain in effect (meaning this test can be used) for the duration of the COVID-19 declaration under Section 564(b)(1) of the Act, 21 U.S.C. section 360bbb-3(b)(1), unless the authorization is terminated or revoked.  Performed  at [facility], [address], [place], [place] [address]       Radiology Studies: No results found.    LOS: 1 day    Jacquelin Hawking, MD [facility] 12/26/2022, 12:37 PM   If 7PM-7AM, please contact night-coverage [email or web address]

## 2022-12-26 NOTE — H&P (View-Only) (Signed)
Referring Provider: Dr. Beola Cord Primary Care Physician:  [name], MD Primary Gastroenterologist:  Dr. Tiajuana Amass   Reason for Consultation:  Hematemesis, cirrhosis   HPI: Thomas [name] is a 47 y.o. male a past medical history of obesity, sleep apnea (does not use CPAP), psoriatic arthritis, alcohol associated cirrhosis, portal hypertensive gastropathy, esophageal and gastric varices.  He presented to the ED 12/25/2022 due to vomiting after taking Percocet for a right ankle fracture, emesis described as blood streaked. Labs in the ED showed a WBC count of 2.75. Hg 7.0 (baseline Hg 11.1 on 08/16/2022). HCT 23.9. MCV 86.9. PLT 176. INR 1.5. Na 135. K 4.5. BUN 28 (BUN 7 on 08/16/2022). Albumin 2.4. T. Bili 2.7. Alk Phos 108. AST 66. ALT 108.   Transfused one unit of PRBCs ->  post transfusion Hg 7.0. Started on Octreotide, PPI and Rocephin 1gm IV . Today Hg 6.6.  He fell and fractured his right ankle, seen in the ED on 12/22/2022. Xray showed a bimalleolar fracture and fracture reduction was performed under sedation. He was discharged home on Oxycodone and Ibuprofen with plans to follow up with ortho today.  He took Oxycodone yesterday and shortly after vomited up partially digested food mixed with "dark red  blood" x 1 without recurrence. He passed one "charcoal" colored stools yesterday. No bright red rectal bleeding. He endorsed taking Ibuprofen 200mg  3 tabs po Q 4hrs x 3 days and Oxycodone one tab Q 4 to 5 hours x 3 days due to having ankle pain. He stopped all alcohol use following his hospital admission 06/2022 with UGI bleed. However, he and his girlfriend ended their relationship and he drank " a lot of whiskey" July 4 through December 13, 2022. No alcohol since then. He stopped taking Propanolol 20mg  bid and Protonix 40mg  every day one month ago after he ran out of these prescriptions. No further vomiting or hematemesis since admission. No confusion.   He was admitted to  the hospital 07/05/2022 due to having hematemesis and melena. Admission Hg 11.2 -> 9.6. MELD 3.0: 16. Received Octreotide, PPI infusion and Rocephin prophylaxis. EGD 07/05/2022 identified grade 2 esophageal varices with no bleeding and no stigmata of recent bleed, type 2 gastric varices without bleeding, gastritis with hemorrhage. His clinical status stabilized and he was discharged home 07/06/2022 on Propanolol 20mg  po bid and Pantoprazole 40mg  every day.   He was previously seen in our outpatient office 09/05/2021 due to having hematemesis and bloody diarrhea while visiting family in [place] 07/2021. GI pathogen panel at that time was positive for E. Coli treated with Azithromycin. EGD and colonoscopy were done 10/2021. The EGD identified grade 2 esophageal varices, portal hypertensive gastropathy, and some gastric polyps. The colonoscopy identified a few tubular adenomatous polyps removed from the colon and nonbleeding colonic AVM.   Father with history of hepatitis with cirrhosis, further details unclear. Mother had liver disease, further details unclear.  GI PROCEDURES:  Colonoscopy 10/15/2021 with a couple of polyps removed.  Also had a nonbleeding colonic angiodysplastic lesion. Recall colonoscopy 3 years.   EGD 10/15/2021 had grade 2 esophageal varices, portal hypertensive gastropathy and some gastric polyps.   1. Surgical [P], pre-pyloric stomach (polypoid lesions) - EROSION, ACUTE INFLAMMATION AND GRANULATION TISSUE CONSISTENT WITH POLYPOID GASTRITIS. - NO HELICOBACTER PYLORI IDENTIFIED. 2. Surgical [P], gastric body - OXYNTIC MUCOSA WITH FOCAL EROSION AND HYPEREMIA. - NO HELICOBACTER PYLORI IDENTIFIED. 3. Surgical [P], colon, transverse and cecum, polyp (2) - TUBULAR ADENOMA (1) WITHOUT  HIGH GRADE DYSPLASIA. - BENIGN COLONIC MUCOSA (1).  PAST IMAGE STUDIES:  RUQ sonogram 09/12/2021: Gallbladder: The gallbladder is contracted, limiting evaluation. The gallbladder wall is thickened,  however this may be secondary to the underdistention. Gallbladder wall thickness is measured up to 8.7 mm (normal 3 mm or smaller when the gallbladder is adequately distended). The sonographer reports the patient describes minimal pain when the sonographer probe is pressed over the gallbladder. No definite sonographic Murphy's sign. No pericholecystic fluid or gallstones.   Common bile duct: Diameter: 4.5 mm, within normal limits.   Liver: There is again a nodular liver contour. Heterogeneous hepatic echotexture. No definite focal liver lesion. Portal vein is patent on color Doppler imaging with normal direction of blood flow towards the liver.  IMPRESSION: 1. The gallbladder is contracted, limiting evaluation. No gallstones are seen. 2. There is again a nodular liver contour compatible with cirrhosis. 3. The portal vein is patent and demonstrates normal flow direction.  Past Medical History:  Diagnosis Date   Acute conjunctivitis of both eyes 10/22/2021   Acute sinusitis 06/16/2013   Alcoholic fatty liver 01/16/2010   Needs final HBV and HAV vaccines on or after 10/25/2012    Alcoholism (HCC) 12/25/2011   Allergic rhinitis    Childhood asthma    Elevated transaminase level 06/10/2007   AST: 80 ALT: 136 in 8/11: Hepatitis A., B and C negative.    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    Sleep apnea     Past Surgical History:  Procedure Laterality Date   ESOPHAGOGASTRODUODENOSCOPY (EGD) WITH PROPOFOL N/A 07/05/2022   Procedure: ESOPHAGOGASTRODUODENOSCOPY (EGD) WITH PROPOFOL;  Surgeon: Napoleon Form, MD;  Location: [facility] ENDOSCOPY;  Service: Gastroenterology;  Laterality: N/A;    Prior to Admission medications   Medication Sig Start Date End Date Taking? Authorizing Provider  buPROPion (WELLBUTRIN XL) 150 MG 24 hr  tablet Take 150 mg by mouth daily.   Yes [provider]  ibuprofen (ADVIL) 200 MG tablet Take 600 mg by mouth every 4 (four) hours as needed for moderate pain.   Yes [provider]  oxyCODONE-acetaminophen (PERCOCET/ROXICET) 5-325 MG tablet Take 1 tablet by mouth every 6 (six) hours as needed for up to 5 days for severe pain. 12/22/22 12/27/22 Yes Fayrene Helper, MD  propranolol (INDERAL) 20 MG tablet TAKE 1 TABLET (20 MG TOTAL) BY MOUTH 2 (TWO) TIMES DAILY. TAKE 20MG  TWICE DAILY. GOAL IS A RESTING HEART RATE OF 55-60. 10/01/22  Yes [name], MD  cyclobenzaprine (FLEXERIL) 5 MG tablet Take 1 tablet (5 mg total) by mouth 3 (three) times daily as needed for muscle spasms. Patient not taking: Reported on 12/25/2022 12/22/22   Fayrene Helper, MD    Current Facility-Administered Medications  Medication Dose Route Frequency Provider Last Rate Last Admin   0.9 %  sodium chloride infusion (Manually program via Guardrails IV Fluids)   Intravenous Once Synetta Fail, MD       acetaminophen (TYLENOL) tablet 650 mg  650 mg Oral Q6H PRN Synetta Fail, MD   650 mg at 12/26/22 0409   Or   acetaminophen (TYLENOL) suppository 650 mg  650 mg Rectal Q6H PRN Synetta Fail, MD       cefTRIAXone (ROCEPHIN) 1 g in sodium chloride 0.9 % 100 mL IVPB  1 g Intravenous  Q24H Carollee Herter, DO       octreotide (SANDOSTATIN) 500 mcg in sodium chloride 0.9 % 250 mL (2 mcg/mL) infusion  50 mcg/hr Intravenous Continuous Synetta Fail, MD 25 mL/hr at 12/25/22 1759 50 mcg/hr at 12/25/22 1759   ondansetron (ZOFRAN) injection 4 mg  4 mg Intravenous Q6H PRN Synetta Fail, MD       oxyCODONE (Oxy IR/ROXICODONE) immediate release tablet 5 mg  5 mg Oral Q6H PRN Carollee Herter, DO   5 mg at 12/26/22 0407   propranolol (INDERAL) tablet 20 mg  20 mg Oral BID Synetta Fail, MD   20 mg at 12/25/22 2243   sodium chloride flush (NS) 0.9 % injection 3 mL  3 mL Intravenous Q12H Synetta Fail, MD        Allergies as of 12/25/2022 - Review Complete 12/25/2022  Allergen Reaction Noted   Codeine Other (See Comments) 02/13/2015    Family History  Problem Relation Age of Onset   Liver disease Mother    Depression Mother    Liver disease Father    Diabetes Father    Hypertension Father    Liver disease Sister    Diabetes Other    Colon cancer Neg Hx    Esophageal cancer Neg Hx    Stomach cancer Neg Hx    Colon polyps Neg Hx     Social History   Socioeconomic History   Marital status: Single    Spouse name: Not on file   Number of children: Not on file   Years of education: Not on file   Highest education level: Not on file  Occupational History   Not on file  Tobacco Use   Smoking status: Some Days    Types: Cigars   Smokeless tobacco: Never   Tobacco comments:    Cigars occasional  Vaping Use   Vaping status: Never Used  Substance and Sexual Activity   Alcohol use: Not Currently    Comment: Beer/wine 1 - 3 times a week (drinks heavily on weekends)   Drug use: Never    Comment: history of cocaine abuse   Sexual activity: Yes  Other Topics Concern   Not on file  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             Social Determinants of Health   Financial Resource Strain: Not on file  Food Insecurity: Not on file  Transportation Needs: Not on file  Physical Activity: Not on file  Stress: Not on file  Social Connections: Not on file  Intimate Partner Violence: Not on file   Review of Systems: Gen: Denies fever, sweats or chills. No weight loss.  CV: Denies chest pain, palpitations or edema. Resp: Denies cough, shortness of breath of hemoptysis.  GI: See HPI.  GU : Denies urinary burning, blood in urine, increased urinary frequency or incontinence. MS: + Right ankle pain. Derm: Denies rash, itchiness, skin lesions or unhealing ulcers. Psych: + Depression. Heme: Denies easy bruising,  bleeding. Neuro:  Denies headaches, dizziness or paresthesias. Endo:  Denies any problems with DM, thyroid or adrenal function.  Physical Exam: Vital signs in last 24 hours: Temp:  [79.9 F (26.6 C)-98.8 F (37.1 C)] 79.9 F (26.6 C) (07/19 0331) Pulse Rate:  [75-118] 75 (07/19 0331) Resp:  [14-18] 17 (07/19 0331) BP: (109-145)/(47-73) 126/61 (07/19 0331) SpO2:  [99 %-100 %] 100 % (07/19 0331) Weight:  [128.9 kg-129.3 kg]  128.9 kg (07/18 2050)   General: Alert 47 year old male in NAD.  Head:  Normocephalic and atraumatic. Eyes:  No scleral icterus. Conjunctiva pink. Ears:  Normal auditory acuity. Nose:  No deformity, discharge or lesions. Mouth:  Dentition intact. No ulcers or lesions.  Neck:  Supple. No lymphadenopathy or thyromegaly.  Lungs: Breath sounds clear throughout. No wheezes, rhonchi or crackles.  Heart:  RRR, no murmur.  Abdomen:  Obese abdomen, soft, nontender. No ascites. No palpable hepatosplenomegaly. Positive bowel sounds x 4 quads.  Rectal: Deferred. Musculoskeletal:  Right ankle with ace wrap in ortho boot. Pulses:  Normal pulses noted. Extremities:  Without clubbing or edema. Neurologic:  Alert and  oriented x 4. No focal deficits. No asterixis.  Skin:  Intact without significant lesions or rashes. Psych:  Alert and cooperative. Normal mood and affect.  Intake/Output from previous day: No intake/output data recorded. Intake/Output this shift: No intake/output data recorded.  Lab Results: Recent Labs    12/25/22 1608 12/25/22 2107  WBC 6.6 6.6  HGB 7.0* 7.0*  HCT 23.9* 22.5*  PLT 176 152   BMET Recent Labs    12/25/22 1608  NA 135  K 4.5  CL 103  CO2 22  GLUCOSE 103*  BUN 28*  CREATININE 0.82  CALCIUM 8.4*   LFT Recent Labs    12/25/22 1608  PROT 5.4*  ALBUMIN 2.4*  AST 66*  ALT 36  ALKPHOS 108  BILITOT 2.7*   PT/INR Recent Labs    12/25/22 1619  LABPROT 18.3*  INR 1.5*   Hepatitis Panel No results for input(s):  "HEPBSAG", "HCVAB", "HEPAIGM", "HEPBIGM" in the last 72 hours.  MELD 3.0: 17 at 12/25/2022  4:19 PM MELD-Na: 17 at 12/25/2022  4:19 PM Calculated from: Serum Creatinine: 0.82 mg/dL (Using min of 1 mg/dL) at 1/61/0960  4:54 PM Serum Sodium: 135 mmol/L at 12/25/2022  4:08 PM Total Bilirubin: 2.7 mg/dL at 0/98/1191  4:78 PM Serum Albumin: 2.4 g/dL at 2/95/6213  0:86 PM INR(ratio): 1.5 at 12/25/2022  4:19 PM Age at listing (hypothetical): 47 years Sex: Male at 12/25/2022  4:19 PM   Studies/Results: No results found.  IMPRESSION/PLAN:  47 year old male with alcohol associated cirrhosis, portal hypertensive gastropathy, esophageal and gastric varices admitted to the hospital with hematemesis and anemia. MELD 3.0: 17. Admission Hg 7.0 (baseline Hg 11.1) -> transfused one unit of PRBCs -> Hg 7.0 -> this morning Hg 6.6. EGD during a prior hospitalization for UGI bleed 07/05/2022 identified grade 2 esophageal varices with no bleeding and no stigmata of recent bleed, type 2 gastric varices without bleeding, gastritis with hemorrhage. No overt hepatic encephalopathy. Recent alcohol bing, last alcohol consumption was 12/13/2022.  -NPO -IV fluids per the hospitalist  -EGD benefits and risks discussed including risk with sedation, risk of bleeding, perforation and infection, timing to be determined -Transfuse one unit of PRBCs -Check H/H post transfusion and Q 6 hrs x 24 hours -Ondansetron 4mg  po or IV Q 6 hrs PRN -Continue Octreotide infusion  -PPI IV bid -Propanolol 20mg  po bid  -No NSAIDs -Await today's  CMP results -Surveillance RUQ sonogram  -Patient counseled complete alcohol abstinence  -Await further recommendations per Dr. Lavon Paganini  Acute on chronic anemia secondary to cirrhosis and GI blood loss. -See plan above  Coagulopathy secondary to cirrhosis. INR 1.5.  History of tubular adenomatous colon polyps  -Next colonoscopy due 11/02/2022  Psoriatic arthritis on Skyrizi   Right ankle  fracture -No NSAIDs  -Patient missed  outpatient ortho appointment today   Arnaldo Natal  12/26/2022, 8:21 AM    Attending physician's note  I have taken a history, reviewed the chart and examined the patient. I performed a substantive portion of this encounter, including complete performance of at least one of the key components, in conjunction with the APP. I agree with the APP's note, impression and recommendations.   53 yr M with h/o obesity, alcohol induced cirrhosis, complicated by esophageal and gastric varices admitted with hematemesis and drop in hgb  MELD 3.0: 17 at 12/26/2022  6:20 AM  He remains hemodynamically stable Presentation concerning for possible variceal hemorrhage, PUD in the differential  Octreotide gtt PPI twice daily IV ceftriaxone  Plan for urgent EGD for evaluation and endoscopic intervention as needed The risks and benefits as well as alternatives of endoscopic procedure(s) have been discussed and reviewed. All questions answered. The patient agrees to proceed.  Monitor hgb and transfuse >7  Discussed alcohol cessation No evidence of hepatic encephalopathy   The patient was provided an opportunity to ask questions and all were answered. The patient agreed with the plan and demonstrated an understanding of the instructions.  Iona Beard , MD [phone]

## 2022-12-26 NOTE — Anesthesia Preprocedure Evaluation (Signed)
Anesthesia Evaluation  Patient identified by MRN, date of birth, ID band Patient awake    Reviewed: Allergy & Precautions, NPO status , Patient's Chart, lab work & pertinent test results  History of Anesthesia Complications Negative for: history of anesthetic complications  Airway Mallampati: II  TM Distance: >3 FB Neck ROM: Full    Dental  (+) Dental Advisory Given   Pulmonary asthma , sleep apnea (noncompliant) , Current Smoker and Patient abstained from smoking.   Pulmonary exam normal        Cardiovascular negative cardio ROS Normal cardiovascular exam     Neuro/Psych negative neurological ROS  negative psych ROS   GI/Hepatic negative GI ROS,,,(+)     substance abuse  alcohol use  Endo/Other    Morbid obesity  Renal/GU negative Renal ROS     Musculoskeletal negative musculoskeletal ROS (+) Arthritis ,    Abdominal  (+) + obese  Peds  Hematology  (+) Blood dyscrasia, anemia  Plt 127k INR 1.5    Anesthesia Other Findings   Reproductive/Obstetrics                             Anesthesia Physical Anesthesia Plan  ASA: 3  Anesthesia Plan: General   Post-op Pain Management: Ketamine IV* and Regional block*   Induction: Intravenous  PONV Risk Score and Plan: 1 and Treatment may vary due to age or medical condition, Ondansetron, Dexamethasone and Midazolam  Airway Management Planned: Oral ETT  Additional Equipment: None  Intra-op Plan:   Post-operative Plan: Extubation in OR  Informed Consent: I have reviewed the patients History and Physical, chart, labs and discussed the procedure including the risks, benefits and alternatives for the proposed anesthesia with the patient or authorized representative who has indicated his/her understanding and acceptance.     Dental advisory given  Plan Discussed with: CRNA, Anesthesiologist and Surgeon  Anesthesia Plan Comments:          Anesthesia Quick Evaluation

## 2022-12-26 NOTE — Consult Note (Addendum)
Referring Provider: Dr. Beola Cord Primary Care Physician:  [name], MD Primary Gastroenterologist:  Dr. Tiajuana Amass   Reason for Consultation:  Hematemesis, cirrhosis   HPI: Thomas [name] is a 47 y.o. male a past medical history of obesity, sleep apnea (does not use CPAP), psoriatic arthritis, alcohol associated cirrhosis, portal hypertensive gastropathy, esophageal and gastric varices.  He presented to the ED 12/25/2022 due to vomiting after taking Percocet for a right ankle fracture, emesis described as blood streaked. Labs in the ED showed a WBC count of 2.75. Hg 7.0 (baseline Hg 11.1 on 08/16/2022). HCT 23.9. MCV 86.9. PLT 176. INR 1.5. Na 135. K 4.5. BUN 28 (BUN 7 on 08/16/2022). Albumin 2.4. T. Bili 2.7. Alk Phos 108. AST 66. ALT 108.   Transfused one unit of PRBCs ->  post transfusion Hg 7.0. Started on Octreotide, PPI and Rocephin 1gm IV . Today Hg 6.6.  He fell and fractured his right ankle, seen in the ED on 12/22/2022. Xray showed a bimalleolar fracture and fracture reduction was performed under sedation. He was discharged home on Oxycodone and Ibuprofen with plans to follow up with ortho today.  He took Oxycodone yesterday and shortly after vomited up partially digested food mixed with "dark red  blood" x 1 without recurrence. He passed one "charcoal" colored stools yesterday. No bright red rectal bleeding. He endorsed taking Ibuprofen 200mg  3 tabs po Q 4hrs x 3 days and Oxycodone one tab Q 4 to 5 hours x 3 days due to having ankle pain. He stopped all alcohol use following his hospital admission 06/2022 with UGI bleed. However, he and his girlfriend ended their relationship and he drank " a lot of whiskey" July 4 through December 13, 2022. No alcohol since then. He stopped taking Propanolol 20mg  bid and Protonix 40mg  every day one month ago after he ran out of these prescriptions. No further vomiting or hematemesis since admission. No confusion.   He was admitted to  the hospital 07/05/2022 due to having hematemesis and melena. Admission Hg 11.2 -> 9.6. MELD 3.0: 16. Received Octreotide, PPI infusion and Rocephin prophylaxis. EGD 07/05/2022 identified grade 2 esophageal varices with no bleeding and no stigmata of recent bleed, type 2 gastric varices without bleeding, gastritis with hemorrhage. His clinical status stabilized and he was discharged home 07/06/2022 on Propanolol 20mg  po bid and Pantoprazole 40mg  every day.   He was previously seen in our outpatient office 09/05/2021 due to having hematemesis and bloody diarrhea while visiting family in [place] 07/2021. GI pathogen panel at that time was positive for E. Coli treated with Azithromycin. EGD and colonoscopy were done 10/2021. The EGD identified grade 2 esophageal varices, portal hypertensive gastropathy, and some gastric polyps. The colonoscopy identified a few tubular adenomatous polyps removed from the colon and nonbleeding colonic AVM.   Father with history of hepatitis with cirrhosis, further details unclear. Mother had liver disease, further details unclear.  GI PROCEDURES:  Colonoscopy 10/15/2021 with a couple of polyps removed.  Also had a nonbleeding colonic angiodysplastic lesion. Recall colonoscopy 3 years.   EGD 10/15/2021 had grade 2 esophageal varices, portal hypertensive gastropathy and some gastric polyps.   1. Surgical [P], pre-pyloric stomach (polypoid lesions) - EROSION, ACUTE INFLAMMATION AND GRANULATION TISSUE CONSISTENT WITH POLYPOID GASTRITIS. - NO HELICOBACTER PYLORI IDENTIFIED. 2. Surgical [P], gastric body - OXYNTIC MUCOSA WITH FOCAL EROSION AND HYPEREMIA. - NO HELICOBACTER PYLORI IDENTIFIED. 3. Surgical [P], colon, transverse and cecum, polyp (2) - TUBULAR ADENOMA (1) WITHOUT  HIGH GRADE DYSPLASIA. - BENIGN COLONIC MUCOSA (1).  PAST IMAGE STUDIES:  RUQ sonogram 09/12/2021: Gallbladder: The gallbladder is contracted, limiting evaluation. The gallbladder wall is thickened,  however this may be secondary to the underdistention. Gallbladder wall thickness is measured up to 8.7 mm (normal 3 mm or smaller when the gallbladder is adequately distended). The sonographer reports the patient describes minimal pain when the sonographer probe is pressed over the gallbladder. No definite sonographic Murphy's sign. No pericholecystic fluid or gallstones.   Common bile duct: Diameter: 4.5 mm, within normal limits.   Liver: There is again a nodular liver contour. Heterogeneous hepatic echotexture. No definite focal liver lesion. Portal vein is patent on color Doppler imaging with normal direction of blood flow towards the liver.  IMPRESSION: 1. The gallbladder is contracted, limiting evaluation. No gallstones are seen. 2. There is again a nodular liver contour compatible with cirrhosis. 3. The portal vein is patent and demonstrates normal flow direction.  Past Medical History:  Diagnosis Date   Acute conjunctivitis of both eyes 10/22/2021   Acute sinusitis 06/16/2013   Alcoholic fatty liver 01/16/2010   Needs final HBV and HAV vaccines on or after 10/25/2012    Alcoholism (HCC) 12/25/2011   Allergic rhinitis    Childhood asthma    Elevated transaminase level 06/10/2007   AST: 80 ALT: 136 in 8/11: Hepatitis A., B and C negative.    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    Sleep apnea     Past Surgical History:  Procedure Laterality Date   ESOPHAGOGASTRODUODENOSCOPY (EGD) WITH PROPOFOL N/A 07/05/2022   Procedure: ESOPHAGOGASTRODUODENOSCOPY (EGD) WITH PROPOFOL;  Surgeon: Napoleon Form, MD;  Location: [facility] ENDOSCOPY;  Service: Gastroenterology;  Laterality: N/A;    Prior to Admission medications   Medication Sig Start Date End Date Taking? Authorizing Provider  buPROPion (WELLBUTRIN XL) 150 MG 24 hr  tablet Take 150 mg by mouth daily.   Yes [provider]  ibuprofen (ADVIL) 200 MG tablet Take 600 mg by mouth every 4 (four) hours as needed for moderate pain.   Yes [provider]  oxyCODONE-acetaminophen (PERCOCET/ROXICET) 5-325 MG tablet Take 1 tablet by mouth every 6 (six) hours as needed for up to 5 days for severe pain. 12/22/22 12/27/22 Yes Fayrene Helper, MD  propranolol (INDERAL) 20 MG tablet TAKE 1 TABLET (20 MG TOTAL) BY MOUTH 2 (TWO) TIMES DAILY. TAKE 20MG  TWICE DAILY. GOAL IS A RESTING HEART RATE OF 55-60. 10/01/22  Yes [name], MD  cyclobenzaprine (FLEXERIL) 5 MG tablet Take 1 tablet (5 mg total) by mouth 3 (three) times daily as needed for muscle spasms. Patient not taking: Reported on 12/25/2022 12/22/22   Fayrene Helper, MD    Current Facility-Administered Medications  Medication Dose Route Frequency Provider Last Rate Last Admin   0.9 %  sodium chloride infusion (Manually program via Guardrails IV Fluids)   Intravenous Once Synetta Fail, MD       acetaminophen (TYLENOL) tablet 650 mg  650 mg Oral Q6H PRN Synetta Fail, MD   650 mg at 12/26/22 0409   Or   acetaminophen (TYLENOL) suppository 650 mg  650 mg Rectal Q6H PRN Synetta Fail, MD       cefTRIAXone (ROCEPHIN) 1 g in sodium chloride 0.9 % 100 mL IVPB  1 g Intravenous  Q24H Carollee Herter, DO       octreotide (SANDOSTATIN) 500 mcg in sodium chloride 0.9 % 250 mL (2 mcg/mL) infusion  50 mcg/hr Intravenous Continuous Synetta Fail, MD 25 mL/hr at 12/25/22 1759 50 mcg/hr at 12/25/22 1759   ondansetron (ZOFRAN) injection 4 mg  4 mg Intravenous Q6H PRN Synetta Fail, MD       oxyCODONE (Oxy IR/ROXICODONE) immediate release tablet 5 mg  5 mg Oral Q6H PRN Carollee Herter, DO   5 mg at 12/26/22 0407   propranolol (INDERAL) tablet 20 mg  20 mg Oral BID Synetta Fail, MD   20 mg at 12/25/22 2243   sodium chloride flush (NS) 0.9 % injection 3 mL  3 mL Intravenous Q12H Synetta Fail, MD        Allergies as of 12/25/2022 - Review Complete 12/25/2022  Allergen Reaction Noted   Codeine Other (See Comments) 02/13/2015    Family History  Problem Relation Age of Onset   Liver disease Mother    Depression Mother    Liver disease Father    Diabetes Father    Hypertension Father    Liver disease Sister    Diabetes Other    Colon cancer Neg Hx    Esophageal cancer Neg Hx    Stomach cancer Neg Hx    Colon polyps Neg Hx     Social History   Socioeconomic History   Marital status: Single    Spouse name: Not on file   Number of children: Not on file   Years of education: Not on file   Highest education level: Not on file  Occupational History   Not on file  Tobacco Use   Smoking status: Some Days    Types: Cigars   Smokeless tobacco: Never   Tobacco comments:    Cigars occasional  Vaping Use   Vaping status: Never Used  Substance and Sexual Activity   Alcohol use: Not Currently    Comment: Beer/wine 1 - 3 times a week (drinks heavily on weekends)   Drug use: Never    Comment: history of cocaine abuse   Sexual activity: Yes  Other Topics Concern   Not on file  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             Social Determinants of Health   Financial Resource Strain: Not on file  Food Insecurity: Not on file  Transportation Needs: Not on file  Physical Activity: Not on file  Stress: Not on file  Social Connections: Not on file  Intimate Partner Violence: Not on file   Review of Systems: Gen: Denies fever, sweats or chills. No weight loss.  CV: Denies chest pain, palpitations or edema. Resp: Denies cough, shortness of breath of hemoptysis.  GI: See HPI.  GU : Denies urinary burning, blood in urine, increased urinary frequency or incontinence. MS: + Right ankle pain. Derm: Denies rash, itchiness, skin lesions or unhealing ulcers. Psych: + Depression. Heme: Denies easy bruising,  bleeding. Neuro:  Denies headaches, dizziness or paresthesias. Endo:  Denies any problems with DM, thyroid or adrenal function.  Physical Exam: Vital signs in last 24 hours: Temp:  [79.9 F (26.6 C)-98.8 F (37.1 C)] 79.9 F (26.6 C) (07/19 0331) Pulse Rate:  [75-118] 75 (07/19 0331) Resp:  [14-18] 17 (07/19 0331) BP: (109-145)/(47-73) 126/61 (07/19 0331) SpO2:  [99 %-100 %] 100 % (07/19 0331) Weight:  [128.9 kg-129.3 kg]  128.9 kg (07/18 2050)   General: Alert 47 year old male in NAD.  Head:  Normocephalic and atraumatic. Eyes:  No scleral icterus. Conjunctiva pink. Ears:  Normal auditory acuity. Nose:  No deformity, discharge or lesions. Mouth:  Dentition intact. No ulcers or lesions.  Neck:  Supple. No lymphadenopathy or thyromegaly.  Lungs: Breath sounds clear throughout. No wheezes, rhonchi or crackles.  Heart:  RRR, no murmur.  Abdomen:  Obese abdomen, soft, nontender. No ascites. No palpable hepatosplenomegaly. Positive bowel sounds x 4 quads.  Rectal: Deferred. Musculoskeletal:  Right ankle with ace wrap in ortho boot. Pulses:  Normal pulses noted. Extremities:  Without clubbing or edema. Neurologic:  Alert and  oriented x 4. No focal deficits. No asterixis.  Skin:  Intact without significant lesions or rashes. Psych:  Alert and cooperative. Normal mood and affect.  Intake/Output from previous day: No intake/output data recorded. Intake/Output this shift: No intake/output data recorded.  Lab Results: Recent Labs    12/25/22 1608 12/25/22 2107  WBC 6.6 6.6  HGB 7.0* 7.0*  HCT 23.9* 22.5*  PLT 176 152   BMET Recent Labs    12/25/22 1608  NA 135  K 4.5  CL 103  CO2 22  GLUCOSE 103*  BUN 28*  CREATININE 0.82  CALCIUM 8.4*   LFT Recent Labs    12/25/22 1608  PROT 5.4*  ALBUMIN 2.4*  AST 66*  ALT 36  ALKPHOS 108  BILITOT 2.7*   PT/INR Recent Labs    12/25/22 1619  LABPROT 18.3*  INR 1.5*   Hepatitis Panel No results for input(s):  "HEPBSAG", "HCVAB", "HEPAIGM", "HEPBIGM" in the last 72 hours.  MELD 3.0: 17 at 12/25/2022  4:19 PM MELD-Na: 17 at 12/25/2022  4:19 PM Calculated from: Serum Creatinine: 0.82 mg/dL (Using min of 1 mg/dL) at 1/61/0960  4:54 PM Serum Sodium: 135 mmol/L at 12/25/2022  4:08 PM Total Bilirubin: 2.7 mg/dL at 0/98/1191  4:78 PM Serum Albumin: 2.4 g/dL at 2/95/6213  0:86 PM INR(ratio): 1.5 at 12/25/2022  4:19 PM Age at listing (hypothetical): 47 years Sex: Male at 12/25/2022  4:19 PM   Studies/Results: No results found.  IMPRESSION/PLAN:  47 year old male with alcohol associated cirrhosis, portal hypertensive gastropathy, esophageal and gastric varices admitted to the hospital with hematemesis and anemia. MELD 3.0: 17. Admission Hg 7.0 (baseline Hg 11.1) -> transfused one unit of PRBCs -> Hg 7.0 -> this morning Hg 6.6. EGD during a prior hospitalization for UGI bleed 07/05/2022 identified grade 2 esophageal varices with no bleeding and no stigmata of recent bleed, type 2 gastric varices without bleeding, gastritis with hemorrhage. No overt hepatic encephalopathy. Recent alcohol bing, last alcohol consumption was 12/13/2022.  -NPO -IV fluids per the hospitalist  -EGD benefits and risks discussed including risk with sedation, risk of bleeding, perforation and infection, timing to be determined -Transfuse one unit of PRBCs -Check H/H post transfusion and Q 6 hrs x 24 hours -Ondansetron 4mg  po or IV Q 6 hrs PRN -Continue Octreotide infusion  -PPI IV bid -Propanolol 20mg  po bid  -No NSAIDs -Await today's  CMP results -Surveillance RUQ sonogram  -Patient counseled complete alcohol abstinence  -Await further recommendations per Dr. Lavon Paganini  Acute on chronic anemia secondary to cirrhosis and GI blood loss. -See plan above  Coagulopathy secondary to cirrhosis. INR 1.5.  History of tubular adenomatous colon polyps  -Next colonoscopy due 11/02/2022  Psoriatic arthritis on Skyrizi   Right ankle  fracture -No NSAIDs  -Patient missed  outpatient ortho appointment today   Arnaldo Natal  12/26/2022, 8:21 AM    Attending physician's note  I have taken a history, reviewed the chart and examined the patient. I performed a substantive portion of this encounter, including complete performance of at least one of the key components, in conjunction with the APP. I agree with the APP's note, impression and recommendations.   53 yr M with h/o obesity, alcohol induced cirrhosis, complicated by esophageal and gastric varices admitted with hematemesis and drop in hgb  MELD 3.0: 17 at 12/26/2022  6:20 AM  He remains hemodynamically stable Presentation concerning for possible variceal hemorrhage, PUD in the differential  Octreotide gtt PPI twice daily IV ceftriaxone  Plan for urgent EGD for evaluation and endoscopic intervention as needed The risks and benefits as well as alternatives of endoscopic procedure(s) have been discussed and reviewed. All questions answered. The patient agrees to proceed.  Monitor hgb and transfuse >7  Discussed alcohol cessation No evidence of hepatic encephalopathy   The patient was provided an opportunity to ask questions and all were answered. The patient agreed with the plan and demonstrated an understanding of the instructions.  Iona Beard , MD [phone]

## 2022-12-26 NOTE — Hospital Course (Addendum)
Thomas [name] is a 47 y.o. M with hx cirrhosis, obesity, psoriatic arthritis who presented with UGIB and right ankle fracture.      Significant events: 7/15: Seen in ER for ankle fracture, reduced and discharged home  7/18: Returned with hematemesis, admitted on PPI drip 7/19: GI and Ortho consulted; underwent EGD that showed 3 columns of large varices, with stigmata of recent bleeding, four bands placed; portal gastropathy noted 7/21: ORIF of right ankle by Ortho    Significant studies: 7/29 bilateral doppler US -- negative for DVT   Significant microbiology data: 7/18 COVID negative 7/20 MRSA nares negative   Procedures: 7/19: EGD by Dr. Lavon Paganini 7/21: ORIF bimalleolar ankle fracture with fixation of medial and lateral; Open reduction internal fixation right syndesmosis by Dr. Dion Saucier    Consults: GI Orthopedics

## 2022-12-26 NOTE — Consult Note (Signed)
ORTHOPAEDIC CONSULTATION  REQUESTING PHYSICIAN: Narda Bonds, MD  Chief Complaint: Right ankle pain   HPI: Thomas [name] is a 47 y.o. male with history of obesity, sleep apnea, psoriasis and alcohol associated cirrhosis, esophageal and gastric varices who complains of right ankle pain.  He originally presented to the [facility] emergency department on 12/22/2022 with right ankle pain after a fall on 12/20/2022.  He had significant swelling and bruising with difficulty with weightbearing.  X-rays were taken showing displaced medial and lateral malleolar fractures.  Closed reduction was performed in the emergency department and he was recommended to follow-up in our office.  He was originally scheduled for a visit with Dr. Eulah Pont today to discuss operative plan for his ankle fracture, however we were notified that he was admitted to the hospital with a GI bleed.  Today he states right ankle send moderate pain.  He has been able to stay nonweightbearing on it.  Denies paresthesias.  No other complaints.  Past Medical History:  Diagnosis Date   Acute conjunctivitis of both eyes 10/22/2021   Acute sinusitis 06/16/2013   Alcoholic fatty liver 01/16/2010   Needs final HBV and HAV vaccines on or after 10/25/2012    Alcoholism (HCC) 12/25/2011   Allergic rhinitis    Childhood asthma    Elevated transaminase level 06/10/2007   AST: 80 ALT: 136 in 8/11: Hepatitis A., B and C negative.    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    Sleep apnea    Past Surgical History:  Procedure Laterality Date   ESOPHAGOGASTRODUODENOSCOPY (EGD) WITH PROPOFOL N/A 07/05/2022   Procedure: ESOPHAGOGASTRODUODENOSCOPY (EGD) WITH PROPOFOL;  Surgeon: Napoleon Form, MD;  Location: [facility] ENDOSCOPY;  Service: Gastroenterology;  Laterality: N/A;   Social History    Socioeconomic History   Marital status: Single    Spouse name: Not on file   Number of children: Not on file   Years of education: Not on file   Highest education level: Not on file  Occupational History   Not on file  Tobacco Use   Smoking status: Some Days    Types: Cigars   Smokeless tobacco: Never   Tobacco comments:    Cigars occasional  Vaping Use   Vaping status: Never Used  Substance and Sexual Activity   Alcohol use: Not Currently    Comment: Beer/wine 1 - 3 times a week (drinks heavily on weekends)   Drug use: Never    Comment: history of cocaine abuse   Sexual activity: Yes  Other Topics Concern   Not on file  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             Social Determinants of Health   Financial Resource Strain: Not on file  Food Insecurity: Not on file  Transportation Needs: Not on file  Physical Activity: Not on file  Stress: Not on file  Social Connections: Not on file   Family History  Problem Relation Age of Onset   Liver disease Mother    Depression Mother    Liver disease Father    Diabetes Father    Hypertension Father    Liver disease Sister    Diabetes Other    Colon cancer Neg Hx    Esophageal cancer Neg Hx  Stomach cancer Neg Hx    Colon polyps Neg Hx    Allergies  Allergen Reactions   Codeine Other (See Comments)    Jittery      Positive ROS: All other systems have been reviewed and were otherwise negative with the exception of those mentioned in the HPI and as above.  Physical Exam: General: Alert, no acute distress Cardiovascular: No pretibial edema Respiratory: No cyanosis, no use of accessory musculature GI: No organomegaly, abdomen is soft and non-tender Skin: Psoriatic lesions on medial and lateral lower leg, not extending to ankle. Neurologic: Sensation intact distally Psychiatric: Patient is competent for consent with normal mood and affect Lymphatic: No  axillary or cervical lymphadenopathy  MUSCULOSKELETAL: Please see photos below regarding skin around ankle fracture.  He has significant ecchymosis and edema.  In splint today.  Able to flex and extend all toes.  Distal sensation intact tact.  Capillary refill intact to all toes.       Imaging: Right ankle x-rays taken pre and post postreduction on 12/22/2022 show displaced bimalleolar ankle fracture.  Assessment: Displaced right bimalleolar ankle fracture  Plan: At this time patient is splinted and will remain nonweightbearing on right leg.  Recommend elevation with multiple pillows as much as possible.    Discussed case with both Dr. Eulah Pont and Dr. Dion Saucier, who is on-call for our group this weekend.  He is tentatively posted for right ankle ORIF versus Ex-Fix Sunday morning with Dr. Dion Saucier.  However, he has had significant blood loss and with his most recent hemoglobin at 6.6, he may not be ready for surgery on Sunday. Ideally we would perform at least an external fixation as soon as we can, as this displaced fracture is already a week old. I will discuss plan with medicine and GI team tomorrow.  Armida Sans, PA-C    12/26/2022 5:58 PM

## 2022-12-26 NOTE — Anesthesia Preprocedure Evaluation (Addendum)
Anesthesia Evaluation  Patient identified by MRN, date of birth, ID band Patient awake    Reviewed: Allergy & Precautions, H&P , NPO status , Patient's Chart, lab work & pertinent test results  Airway Mallampati: III  TM Distance: >3 FB Neck ROM: Full    Dental no notable dental hx. (+) Teeth Intact, Dental Advisory Given   Pulmonary asthma , sleep apnea (does not wear CPAP) , Current SmokerPatient did not abstain from smoking.   Pulmonary exam normal breath sounds clear to auscultation       Cardiovascular negative cardio ROS Normal cardiovascular exam Rhythm:Regular Rate:Normal     Neuro/Psych negative neurological ROS  negative psych ROS   GI/Hepatic ,GERD  ,,(+) Cirrhosis   Esophageal Varices  substance abuse  alcohol use  Endo/Other    Morbid obesity (BMI 47.4)  Renal/GU   negative genitourinary   Musculoskeletal  (+) Arthritis ,    Abdominal   Peds negative pediatric ROS (+)  Hematology  (+) Blood dyscrasia, anemia Lab Results      Component                Value               Date                      WBC                      7.5                 12/26/2022                HGB                      6.6 (LL)            12/26/2022                HCT                      22.2 (L)            12/26/2022                MCV                      83.5                12/26/2022                PLT                      152                 12/26/2022              Anesthesia Other Findings All: Codeine  Reproductive/Obstetrics negative OB ROS                             Anesthesia Physical Anesthesia Plan  ASA: 4  Anesthesia Plan: MAC   Post-op Pain Management:    Induction: Intravenous  PONV Risk Score and Plan: Propofol infusion and Treatment may vary due to age or medical condition  Airway Management Planned: Nasal Cannula and Natural Airway  Additional Equipment: None  Intra-op  Plan:   Post-operative Plan:   Informed Consent: I  have reviewed the patients History and Physical, chart, labs and discussed the procedure including the risks, benefits and alternatives for the proposed anesthesia with the patient or authorized representative who has indicated his/her understanding and acceptance.     Dental advisory given  Plan Discussed with: CRNA  Anesthesia Plan Comments:         Anesthesia Quick Evaluation

## 2022-12-26 NOTE — Procedures (Signed)
Indication: acute GI hemorrhage, esophageal varices and gastric varices, portal hypertension with alcoholic cirrhosis  Monitored anesthesia care  ASA 3  No prior anticoagulation  EGD scope was inserted through oropharynx and advanced to second portion of duodenum  Esophagus: 3 large columns of esophageal varices greater than 5 mm in size, red wale sign.  No active bleeding.  4 band ligator's were deployed for variceal band ligation  Stomach: Large clot and altered blood in the fundus, cleared with suction and irrigation, portal hypertensive gastropathy.  No gastric fundic varices. Antral erosion with no active bleeding  Duodenum: Visualized portion appeared normal  Recommendations: Clear liquid tonight Continue octreotide gtt. PPI twice daily Carafate suspension every 6 hours IV ceftriaxone Monitor for alcohol withdrawal  Kirtland Bouchard Scherry Ran , MD [phone]

## 2022-12-26 NOTE — Progress Notes (Signed)
Pt presented to endoscopy today for urgent EGD with band ligation of esophageal varices with MD Nandigam. Pt was brought to recovery on 80 mcg/min of phenylephrine per anesthesia. Phenylephrine weaned for MAP goal of 65 per orders of MDA Moser. Able to discontinue phenylephrine gtt with SBP of 110-120 and MAP in the 70s. VO for 1 unit of PRBC received from MD Moser. ISTAT performed by anesthesia had Hgb of approximately 8. Order to transfuse blood cancelled by MD Maple Hudson. At that time, pt  c/o 10/10 pain in right leg. MDA Moser notified and instructed this RN to give 5 mg oxycodone IR per pt's inpt PRN orders. Medication given as ordered. With medication and repositioning, pt rates pain 5/10. Pt transferred to 4E inpt unit.  Eulas Post, RN 12/26/22 5:38 PM

## 2022-12-27 DIAGNOSIS — K922 Gastrointestinal hemorrhage, unspecified: Secondary | ICD-10-CM | POA: Diagnosis not present

## 2022-12-27 DIAGNOSIS — K920 Hematemesis: Secondary | ICD-10-CM | POA: Diagnosis not present

## 2022-12-27 DIAGNOSIS — I8511 Secondary esophageal varices with bleeding: Secondary | ICD-10-CM

## 2022-12-27 DIAGNOSIS — D500 Iron deficiency anemia secondary to blood loss (chronic): Secondary | ICD-10-CM

## 2022-12-27 DIAGNOSIS — Z86010 Personal history of colonic polyps: Secondary | ICD-10-CM

## 2022-12-27 DIAGNOSIS — I864 Gastric varices: Secondary | ICD-10-CM

## 2022-12-27 DIAGNOSIS — K254 Chronic or unspecified gastric ulcer with hemorrhage: Secondary | ICD-10-CM | POA: Diagnosis not present

## 2022-12-27 DIAGNOSIS — K3189 Other diseases of stomach and duodenum: Secondary | ICD-10-CM

## 2022-12-27 DIAGNOSIS — D689 Coagulation defect, unspecified: Secondary | ICD-10-CM

## 2022-12-27 DIAGNOSIS — K7030 Alcoholic cirrhosis of liver without ascites: Secondary | ICD-10-CM | POA: Diagnosis not present

## 2022-12-27 LAB — CBC
HCT: 22.7 % — ABNORMAL LOW (ref 39.0–52.0)
HCT: 22.7 % — ABNORMAL LOW (ref 39.0–52.0)
Hemoglobin: 7.1 g/dL — ABNORMAL LOW (ref 13.0–17.0)
Hemoglobin: 7.1 g/dL — ABNORMAL LOW (ref 13.0–17.0)
MCH: 27.5 pg (ref 26.0–34.0)
MCH: 27.4 pg (ref 26.0–34.0)
MCHC: 31.3 g/dL (ref 30.0–36.0)
MCHC: 31.3 g/dL (ref 30.0–36.0)
MCV: 88 fL (ref 80.0–100.0)
MCV: 87.6 fL (ref 80.0–100.0)
Platelets: 117 10*3/uL — ABNORMAL LOW (ref 150–400)
Platelets: 121 10*3/uL — ABNORMAL LOW (ref 150–400)
RBC: 2.58 MIL/uL — ABNORMAL LOW (ref 4.22–5.81)
RBC: 2.59 MIL/uL — ABNORMAL LOW (ref 4.22–5.81)
RDW: 20.8 % — ABNORMAL HIGH (ref 11.5–15.5)
RDW: 20.1 % — ABNORMAL HIGH (ref 11.5–15.5)
WBC: 4.6 10*3/uL (ref 4.0–10.5)
WBC: 4.5 10*3/uL (ref 4.0–10.5)
nRBC: 0 % (ref 0.0–0.2)
nRBC: 0 % (ref 0.0–0.2)

## 2022-12-27 LAB — TYPE AND SCREEN

## 2022-12-27 LAB — BPAM RBC
Blood Product Expiration Date: 202408202359
Blood Product Expiration Date: 202408202359
Blood Product Expiration Date: 202408232359
ISSUE DATE / TIME: 202407181825
ISSUE DATE / TIME: 202407201542

## 2022-12-27 LAB — HEMOGLOBIN AND HEMATOCRIT, BLOOD
HCT: 22.6 % — ABNORMAL LOW (ref 39.0–52.0)
HCT: 27.4 % — ABNORMAL LOW (ref 39.0–52.0)
Hemoglobin: 7.3 g/dL — ABNORMAL LOW (ref 13.0–17.0)
Hemoglobin: 8.2 g/dL — ABNORMAL LOW (ref 13.0–17.0)

## 2022-12-27 LAB — PREPARE RBC (CROSSMATCH)

## 2022-12-27 MED ORDER — CHLORHEXIDINE GLUCONATE 4 % EX SOLN
60.0000 mL | Freq: Once | CUTANEOUS | Status: AC
  Administered 2022-12-28: 4 via TOPICAL
  Filled 2022-12-27: qty 60

## 2022-12-27 MED ORDER — ACETAMINOPHEN 500 MG PO TABS: 1000.0000 mg | ORAL_TABLET | Freq: Once | ORAL | Status: AC

## 2022-12-27 MED ORDER — SODIUM CHLORIDE 0.9% IV SOLUTION: Freq: Once | INTRAVENOUS | Status: DC

## 2022-12-27 MED ORDER — POVIDONE-IODINE 10 % EX SWAB
2.0000 | Freq: Once | CUTANEOUS | Status: AC
  Administered 2022-12-28: 2 via TOPICAL

## 2022-12-27 MED ORDER — CEFAZOLIN IN SODIUM CHLORIDE 3-0.9 GM/100ML-% IV SOLN
3.0000 g | INTRAVENOUS | Status: AC
  Administered 2022-12-28: 3 g via INTRAVENOUS

## 2022-12-27 NOTE — Progress Notes (Signed)
Discussed case with anesthesia, medicine, and GI teams. Will plan to move forward with right ankle ex-fix vs. ORIF tomorrow with Dr. Dion Saucier. Will plan to transfuse today ahead of surgery tomorrow.   NPO after midnight.  Janine Ores, PA-C

## 2022-12-27 NOTE — Progress Notes (Signed)
PROGRESS NOTE    Thomas [name]  ZOX:[id number] DOB: 10-08-1975 DOA: 12/25/2022 PCP: [name], MD   Brief Narrative: Thomas [name] is a 47 y.o. male with a history of cirrhosis, varices, portal hypertension, obesity, OSA, psoriatic arthritis.  Patient presented secondary to Nausea, vomiting and hematemesis concerning for upper GI bleeding related to known varices. GI consulted. Patient transfused blood as needed.   Assessment and Plan:  Hematemesis Acute GI bleeding Patient has a history of varices with concern for possible variceal bleeding. Patient is also prescribed ibuprofen but cannot tell me if he has been using it. Hematemesis has now appeared to have resolved. GI consulted and performed an upper endoscopy on 7/19 which was significant for 3 large columns of non-bleeding esophageal varices s/p 4 band ligations in addition to a large clot /altered body in the fundus of the stomach (s/p suction and irrigation) and portal hypertensive gastropathy. -GI recommendations: Protonix, Octreotide, clear liquid diet, Ceftriaxone, Carafate suspension  Acute blood loss anemia Secondary to acute blood loss from GI bleeding. Baseline hemoglobin of 11. Hemoglobin of 7.0 on admission requiring 1 unit of PRBC. Hemoglobin down to 6.6 today after initial unit of PRBC requiring another unit of PRBC. Hemoglobin up to 7.1 post-transfusion. -H&H this afternoon -CBC in AM  Right ankle fracture Diagnosed prior to admission. Required closed reduction in the ED with referral to orthopedic surgery for evaluation. Patient referred to Dr. Myrene Galas. Patient currently non weight bearing to right lower extremity. Orthopedic surgery consulted and will consider inpatient surgical management -Follow-up orthopedic surgery recommendations  Alcoholic cirrhosis Portal hypertension Noted. -Continue Propranolol and Protonix -Continue CIWA  OSA Noted. CPAP held secondary to  nausea/vomiting  Psoriasis Psoriatic arthritis Not on treatment.  Obesity Estimated body mass index is 47.43 kg/m as calculated from the following:   Height as of this encounter: 5\' 5"  (1.651 m).   Weight as of this encounter: 129.3 kg.  DVT prophylaxis: SCDs Code Status:   Code Status: Full Code Family Communication: None Disposition Plan: Discharge home likely in 2+ days pending GI and orthopedic surgery recommendations   Consultants:  [facility]  Procedures:  7/19: Upper GI endoscopy  Antimicrobials: None    Subjective: No nausea/vomiting. No bowel movements but has been passing gas. No abdominal pain.  Objective: BP (!) 106/58 (BP Location: Right Arm)   Pulse 72   Temp 98.5 F (36.9 C) (Oral)   Resp (!) 22   Ht 5\' 5"  (1.651 m)   Wt 129.3 kg   SpO2 99%   BMI 47.43 kg/m   Examination:  General exam: Appears calm and comfortable Respiratory system: Clear to auscultation. Respiratory effort normal. Cardiovascular system: S1 & S2 heard, RRR. Gastrointestinal system: Abdomen is nondistended, soft and nontender. Decreased bowel sounds heard. Central nervous system: Alert and oriented. No focal neurological deficits. Musculoskeletal: No edema. No calf tenderness. Right leg in splint Psychiatry: Judgement and insight appear normal. Mood & affect appropriate.    Data Reviewed: I have personally reviewed following labs and imaging studies  CBC Lab Results  Component Value Date   WBC 4.5 12/27/2022   RBC 2.59 (L) 12/27/2022   HGB 7.1 (L) 12/27/2022   HCT 22.7 (L) 12/27/2022   MCV 87.6 12/27/2022   MCH 27.4 12/27/2022   PLT 121 (L) 12/27/2022   MCHC 31.3 12/27/2022   RDW 20.1 (H) 12/27/2022   LYMPHSABS 2.0 08/01/2021   MONOABS 1.1 (H) 08/01/2021   EOSABS 0.0 08/01/2021   BASOSABS  0.0 08/01/2021     Last metabolic panel Lab Results  Component Value Date   NA 136 12/26/2022   K 4.9 12/26/2022   CL 105 12/26/2022   CO2 28 12/26/2022   BUN 27 (H)  12/26/2022   CREATININE 0.92 12/26/2022   GLUCOSE 102 (H) 12/26/2022   GFRNONAA >60 12/26/2022   GFRAA >89 06/22/2013   CALCIUM 8.3 (L) 12/26/2022   PHOS 3.1 07/05/2022   PROT 4.8 (L) 12/26/2022   ALBUMIN 2.2 (L) 12/26/2022   BILITOT 2.6 (H) 12/26/2022   ALKPHOS 81 12/26/2022   AST 51 (H) 12/26/2022   ALT 31 12/26/2022   ANIONGAP 3 (L) 12/26/2022    GFR: Estimated Creatinine Clearance: 127.1 mL/min (by C-G formula based on SCr of 0.92 mg/dL).  Recent Results (from the past 240 hour(s))  Resp panel by RT-PCR (RSV, Flu A&B, Covid) Anterior Nasal Swab     Status: None   Collection Time: 12/25/22  4:30 PM   Specimen: Anterior Nasal Swab  Result Value Ref Range Status   SARS Coronavirus 2 by RT PCR NEGATIVE NEGATIVE Final   Influenza A by PCR NEGATIVE NEGATIVE Final   Influenza B by PCR NEGATIVE NEGATIVE Final    Comment: (NOTE) The Xpert Xpress SARS-CoV-2/FLU/RSV plus assay is intended as an aid in the diagnosis of influenza from Nasopharyngeal swab specimens and should not be used as a sole basis for treatment. Nasal washings and aspirates are unacceptable for Xpert Xpress SARS-CoV-2/FLU/RSV testing.  Fact Sheet for Patients: BloggerCourse.com  Fact Sheet for Healthcare Providers: SeriousBroker.it  This test is not yet approved or cleared by the [place] FDA and has been authorized for detection and/or diagnosis of SARS-CoV-2 by FDA under an Emergency Use Authorization (EUA). This EUA will remain in effect (meaning this test can be used) for the duration of the COVID-19 declaration under Section 564(b)(1) of the Act, 21 U.S.C. section 360bbb-3(b)(1), unless the authorization is terminated or revoked.     Resp Syncytial Virus by PCR NEGATIVE NEGATIVE Final    Comment: (NOTE) Fact Sheet for Patients: BloggerCourse.com  Fact Sheet for Healthcare  Providers: SeriousBroker.it  This test is not yet approved or cleared by the [place] FDA and has been authorized for detection and/or diagnosis of SARS-CoV-2 by FDA under an Emergency Use Authorization (EUA). This EUA will remain in effect (meaning this test can be used) for the duration of the COVID-19 declaration under Section 564(b)(1) of the Act, 21 U.S.C. section 360bbb-3(b)(1), unless the authorization is terminated or revoked.  Performed at [facility], [address], [place], [place] [address]       Radiology Studies: No results found.    LOS: 2 days    Jacquelin Hawking, MD [facility] 12/27/2022, 9:55 AM   If 7PM-7AM, please contact night-coverage [email or web address]

## 2022-12-27 NOTE — Anesthesia Postprocedure Evaluation (Signed)
Anesthesia Post Note  Patient: Thomas [name]  Procedure(s) Performed: ESOPHAGOGASTRODUODENOSCOPY (EGD) WITH PROPOFOL ESOPHAGEAL BANDING     Patient location during evaluation: Endoscopy Anesthesia Type: MAC Level of consciousness: awake and alert Pain management: pain level controlled Vital Signs Assessment: post-procedure vital signs reviewed and stable Respiratory status: spontaneous breathing, nonlabored ventilation, respiratory function stable and patient connected to nasal cannula oxygen Cardiovascular status: blood pressure returned to baseline and stable Postop Assessment: no apparent nausea or vomiting Anesthetic complications: no   No notable events documented.  Last Vitals:  Vitals:   12/26/22 2307 12/27/22 0318  BP: (!) 99/47 (!) 109/57  Pulse: 78 66  Resp: 19 14  Temp: 37 C 36.6 C  SpO2: 98% 99%    Last Pain:  Vitals:   12/27/22 0318  TempSrc: Oral  PainSc:                  Trevor Iha

## 2022-12-27 NOTE — Progress Notes (Addendum)
[facility] Progress Note  CC:  Hematemesis, cirrhosis   Subjective: He denies having any nausea or vomiting.  No abdominal pain.  No bowel movement within the past 24 hours.  He remains on a clear liquid diet.  No chest pain or shortness of breath.   Objective:   EGD 12/26/2022: Esophagus: 3 large columns of esophageal varices greater than 5 mm in size, red wale sign.  No active bleeding.  4 band ligator's were deployed for variceal band ligation   Stomach: Large clot and altered blood in the fundus, cleared with suction and irrigation, portal hypertensive gastropathy.  No gastric fundic varices. Antral erosion with no active bleeding   Duodenum: Visualized portion appeared normal   Recommendations: Clear liquid tonight Continue octreotide gtt. PPI twice daily Carafate suspension every 6 hours IV ceftriaxone Monitor for alcohol withdrawal   Vital signs in last 24 hours: Temp:  [97.7 F (36.5 C)-98.8 F (37.1 C)] 98.5 F (36.9 C) (07/20 0943) Pulse Rate:  [63-80] 72 (07/20 0943) Resp:  [10-22] 22 (07/20 0943) BP: (93-130)/(40-83) 106/58 (07/20 0943) SpO2:  [91 %-100 %] 99 % (07/20 0943) Weight:  [129.3 kg] 129.3 kg (07/19 1316) Last BM Date : 12/25/22 General: 47 year old male in no acute distress. Heart: Regular rate and rhythm, no murmurs. Pulm: Breath sounds clear throughout. Abdomen: Obese abdomen, soft, nontender.  No obvious ascites.  No hepatosplenomegaly.  Positive bowel sounds to all 4 quadrants. Extremities:  RLE with ace wrap/splint Neurologic:  Alert and  oriented x 4. Grossly normal neurologically.  No asterixis. Psych:  Alert and cooperative. Normal mood and affect.  Intake/Output from previous day: 07/19 0701 - 07/20 0700 In: 1165 [I.V.:350; Blood:315; IV Piggyback:500] Out: 0  Intake/Output this shift: No intake/output data recorded.  Lab Results: Recent Labs    12/26/22 1801 12/27/22 0023 12/27/22 0606  WBC 4.9 4.6 4.5   HGB 7.4* 7.1* 7.1*  HCT 23.2* 22.7* 22.7*  PLT 128* 117* 121*   BMET Recent Labs    12/25/22 1608 12/26/22 0620  NA 135 136  K 4.5 4.9  CL 103 105  CO2 22 28  GLUCOSE 103* 102*  BUN 28* 27*  CREATININE 0.82 0.92  CALCIUM 8.4* 8.3*   LFT Recent Labs    12/26/22 0620  PROT 4.8*  ALBUMIN 2.2*  AST 51*  ALT 31  ALKPHOS 81  BILITOT 2.6*   PT/INR Recent Labs    12/25/22 1619 12/26/22 0620  LABPROT 18.3* 18.0*  INR 1.5* 1.5*   Hepatitis Panel No results for input(s): "HEPBSAG", "HCVAB", "HEPAIGM", "HEPBIGM" in the last 72 hours.  No results found.  Assessment / Plan:  47 year old male with alcohol associated cirrhosis, portal hypertensive gastropathy, esophageal and gastric varices admitted to the hospital with hematemesis and anemia. MELD 3.0: 17. Admission Hg 7.0 (baseline Hg 11.1) -> transfused one unit of PRBCs -> Hg 7.0 -> Hg 6.6 on 7/19 transfused 1 unit of PRBCs -> Hg 7.4 -> today Hg 7.1. No overt hepatic encephalopathy. Recent alcohol bing, last alcohol consumption was 12/13/2022. S/P EGD 12/26/2022 three large columns of esophageal varices greater than 5 mm in size, red wale sign without active bleeding and were banded. A large clot  and blood in the fundus, portal hypertensive gastropathy and an antral erosion without active bleeding also noted. -Advance to full liquid diet  -Continue Octreotide infusion -PPI IV twice daily -Carafate Suspension 1 g every 6 hours -Continue Ceftriaxone IV -Ondansetron 4mg  po or  IV Q 6 hrs PRN -Continue Propanolol 20mg  po bid  -No NSAIDs -Eventual surveillance RUQ sonogram  -Patient counseled complete alcohol abstinence  -Await further recommendations per Dr. Lavon Paganini   Acute on chronic anemia secondary to cirrhosis and GI blood loss. Admission Hg 7.0 (baseline Hg 11.1) -> transfused one unit of PRBCs -> Hg 7.0 -> Hg 6.6 on 7/19 transfused 1 unit of PRBCs -> Hg 7.4 -> today Hg 7.1. -Transfuse PRBC for Hg < 7 typically  recommended in cirrhotic patients, however, in the setting of needing right ankle surgery ok to transfuse to achieve preoperative Hg level of 8   Coagulopathy secondary to cirrhosis. INR 1.5.   History of tubular adenomatous colon polyps  -Next colonoscopy due 11/02/2022   Psoriatic arthritis on Skyrizi    Right ankle fracture. Ortho planning on possible surgery tomorrow for at least an external fixation of his ankle fracture vs. possible definitive surgical fixation of the fracture tomorrow morning.   Principal Problem:   Acute GI bleeding Active Problems:   Alcoholism (HCC)   OSA (obstructive sleep apnea)   Esophageal varices in alcoholic cirrhosis (HCC)   Class 3 severe obesity due to excess calories with body mass index (BMI) of 50.0 to 59.9 in adult [facility])   Normocytic anemia   Psoriatic arthritis (HCC)   Cirrhosis of liver (HCC)   Secondary esophageal varices with bleeding (HCC)     LOS: 2 days   Thomas [name]  12/27/2022, 12:51PM   Attending physician's note   I have taken a history, reviewed the chart and examined the patient. I performed a substantive portion of this encounter, including complete performance of at least one of the key components, in conjunction with the APP. I agree with the APP's note, impression and recommendations.   S/p EGD esophageal variceal band ligation  Continue octreotide gtt. for 72 hours, transition to propranolol once he completes the octreotide PPI twice daily IV ceftriaxone for 5 days Full liquid diet, slowly advance diet to soft tomorrow if hemoglobin remained stable  Monitor and transfuse hemoglobin  MELD 3.0: 17 at 12/26/2022  6:20 AM  Evidence of hepatic encephalopathy  Right ankle fracture, planning for external fixation tomorrow Discussed alcohol cessation   The patient was provided an opportunity to ask questions and all were answered. The patient agreed with the plan and demonstrated an understanding of the  instructions.   Thomas [name] , MD [phone]

## 2022-12-28 ENCOUNTER — Encounter (HOSPITAL_COMMUNITY)
Admission: EM | Disposition: A | Discharge status: Home / Self Care | Payer: Self-pay | Source: Home / Self Care | Attending: Internal Medicine

## 2022-12-28 ENCOUNTER — Inpatient Hospital Stay (HOSPITAL_COMMUNITY): Payer: BC Managed Care – PPO | Admitting: Anesthesiology

## 2022-12-28 ENCOUNTER — Inpatient Hospital Stay (HOSPITAL_COMMUNITY): Payer: BC Managed Care – PPO

## 2022-12-28 ENCOUNTER — Encounter (HOSPITAL_COMMUNITY): Payer: Self-pay | Admitting: Internal Medicine

## 2022-12-28 ENCOUNTER — Other Ambulatory Visit: Payer: Self-pay

## 2022-12-28 DIAGNOSIS — I8510 Secondary esophageal varices without bleeding: Secondary | ICD-10-CM | POA: Diagnosis not present

## 2022-12-28 DIAGNOSIS — I8501 Esophageal varices with bleeding: Secondary | ICD-10-CM | POA: Diagnosis not present

## 2022-12-28 DIAGNOSIS — K920 Hematemesis: Secondary | ICD-10-CM

## 2022-12-28 DIAGNOSIS — K7460 Unspecified cirrhosis of liver: Secondary | ICD-10-CM | POA: Diagnosis not present

## 2022-12-28 DIAGNOSIS — K922 Gastrointestinal hemorrhage, unspecified: Secondary | ICD-10-CM | POA: Diagnosis not present

## 2022-12-28 DIAGNOSIS — K7030 Alcoholic cirrhosis of liver without ascites: Secondary | ICD-10-CM | POA: Diagnosis not present

## 2022-12-28 HISTORY — PX: ORIF ANKLE FRACTURE: SHX5408

## 2022-12-28 LAB — CBC
HCT: 26 % — ABNORMAL LOW (ref 39.0–52.0)
Hemoglobin: 8.1 g/dL — ABNORMAL LOW (ref 13.0–17.0)
MCH: 25.9 pg — ABNORMAL LOW (ref 26.0–34.0)
MCHC: 31.2 g/dL (ref 30.0–36.0)
MCV: 83.1 fL (ref 80.0–100.0)
Platelets: 127 10*3/uL — ABNORMAL LOW (ref 150–400)
RBC: 3.13 MIL/uL — ABNORMAL LOW (ref 4.22–5.81)
RDW: 21.9 % — ABNORMAL HIGH (ref 11.5–15.5)
WBC: 4.9 10*3/uL (ref 4.0–10.5)
nRBC: 0 % (ref 0.0–0.2)

## 2022-12-28 LAB — POCT I-STAT EG7
Acid-Base Excess: 2 mmol/L (ref 0.0–2.0)
Bicarbonate: 27.4 mmol/L (ref 20.0–28.0)
Calcium, Ion: 1.17 mmol/L (ref 1.15–1.40)
HCT: 24 % — ABNORMAL LOW (ref 39.0–52.0)
Hemoglobin: 8.2 g/dL — ABNORMAL LOW (ref 13.0–17.0)
O2 Saturation: 49 %
Potassium: 4.4 mmol/L (ref 3.5–5.1)
Sodium: 140 mmol/L (ref 135–145)
TCO2: 29 mmol/L (ref 22–32)
pCO2, Ven: 47.7 mmHg (ref 44–60)
pH, Ven: 7.368 (ref 7.25–7.43)
pO2, Ven: 28 mmHg — CL (ref 32–45)

## 2022-12-28 LAB — TYPE AND SCREEN
ABO/RH(D): O POS
Antibody Screen: NEGATIVE

## 2022-12-28 LAB — SURGICAL PCR SCREEN
MRSA, PCR: NEGATIVE
Staphylococcus aureus: NEGATIVE

## 2022-12-28 LAB — BPAM RBC: Unit Type and Rh: 5100

## 2022-12-28 SURGERY — OPEN REDUCTION INTERNAL FIXATION (ORIF) ANKLE FRACTURE
Anesthesia: General | Site: Ankle | Laterality: Right

## 2022-12-28 MED ORDER — ALUM & MAG HYDROXIDE-SIMETH 200-200-20 MG/5ML PO SUSP
30.0000 mL | ORAL | Status: DC | PRN
  Administered 2022-12-28 – 2023-01-02 (×3): 30 mL via ORAL
  Filled 2022-12-28 (×3): qty 30

## 2022-12-28 MED ORDER — SUCCINYLCHOLINE CHLORIDE 200 MG/10ML IV SOSY
PREFILLED_SYRINGE | INTRAVENOUS | Status: AC
  Filled 2022-12-28: qty 10

## 2022-12-28 MED ORDER — PROPOFOL 10 MG/ML IV BOLUS
INTRAVENOUS | Status: AC
  Filled 2022-12-28: qty 20

## 2022-12-28 MED ORDER — OXYCODONE HCL 5 MG/5ML PO SOLN: 5.0000 mg | Freq: Once | ORAL | Status: DC | PRN

## 2022-12-28 MED ORDER — SODIUM CHLORIDE 0.9 % IV SOLN: 2.0000 g | INTRAVENOUS | Status: DC

## 2022-12-28 MED ORDER — CHLORHEXIDINE GLUCONATE 0.12 % MT SOLN: 15.0000 mL | Freq: Once | OROMUCOSAL | Status: AC

## 2022-12-28 MED ORDER — MIDAZOLAM HCL 2 MG/2ML IJ SOLN
INTRAMUSCULAR | Status: DC | PRN
  Administered 2022-12-28: 2 mg via INTRAVENOUS

## 2022-12-28 MED ORDER — 0.9 % SODIUM CHLORIDE (POUR BTL) OPTIME
TOPICAL | Status: DC | PRN
  Administered 2022-12-28: 1000 mL

## 2022-12-28 MED ORDER — LACTATED RINGERS IV SOLN: INTRAVENOUS | Status: DC

## 2022-12-28 MED ORDER — EPHEDRINE 5 MG/ML INJ
INTRAVENOUS | Status: AC
  Filled 2022-12-28: qty 5

## 2022-12-28 MED ORDER — PHENYLEPHRINE 80 MCG/ML (10ML) SYRINGE FOR IV PUSH (FOR BLOOD PRESSURE SUPPORT)
PREFILLED_SYRINGE | INTRAVENOUS | Status: AC
  Filled 2022-12-28: qty 10

## 2022-12-28 MED ORDER — PROPOFOL 10 MG/ML IV BOLUS
INTRAVENOUS | Status: DC | PRN
Start: 2022-12-28 — End: 2022-12-28
  Administered 2022-12-28 (×2): 100 mg via INTRAVENOUS
  Administered 2022-12-28: 40 mg via INTRAVENOUS

## 2022-12-28 MED ORDER — BUPIVACAINE-EPINEPHRINE (PF) 0.5% -1:200000 IJ SOLN
INTRAMUSCULAR | Status: DC | PRN
  Administered 2022-12-28: 15 mL via PERINEURAL
  Administered 2022-12-28: 25 mL via PERINEURAL

## 2022-12-28 MED ORDER — SUGAMMADEX SODIUM 200 MG/2ML IV SOLN
INTRAVENOUS | Status: DC | PRN
  Administered 2022-12-28 (×4): 100 mg via INTRAVENOUS

## 2022-12-28 MED ORDER — TRANEXAMIC ACID-NACL 1000-0.7 MG/100ML-% IV SOLN
INTRAVENOUS | Status: AC
  Filled 2022-12-28: qty 100

## 2022-12-28 MED ORDER — NOREPINEPHRINE 4 MG/250ML-% IV SOLN
INTRAVENOUS | Status: AC
  Filled 2022-12-28: qty 250

## 2022-12-28 MED ORDER — FENTANYL CITRATE (PF) 100 MCG/2ML IJ SOLN: 25.0000 ug | INTRAMUSCULAR | Status: DC | PRN

## 2022-12-28 MED ORDER — DEXAMETHASONE SODIUM PHOSPHATE 10 MG/ML IJ SOLN
INTRAMUSCULAR | Status: DC | PRN
  Administered 2022-12-28: 4 mg via INTRAVENOUS

## 2022-12-28 MED ORDER — ACETAMINOPHEN 500 MG PO TABS
ORAL_TABLET | ORAL | Status: AC
  Administered 2022-12-28: 1000 mg via ORAL
  Filled 2022-12-28: qty 2

## 2022-12-28 MED ORDER — LIDOCAINE 2% (20 MG/ML) 5 ML SYRINGE
INTRAMUSCULAR | Status: AC
  Filled 2022-12-28: qty 5

## 2022-12-28 MED ORDER — CEFAZOLIN IN SODIUM CHLORIDE 3-0.9 GM/100ML-% IV SOLN
INTRAVENOUS | Status: AC
  Filled 2022-12-28: qty 100

## 2022-12-28 MED ORDER — GLYCOPYRROLATE PF 0.2 MG/ML IJ SOSY
PREFILLED_SYRINGE | INTRAMUSCULAR | Status: AC
  Filled 2022-12-28: qty 1

## 2022-12-28 MED ORDER — SODIUM CHLORIDE 0.9 % IV SOLN
INTRAVENOUS | Status: AC
  Filled 2022-12-28: qty 20

## 2022-12-28 MED ORDER — ORAL CARE MOUTH RINSE: 15.0000 mL | Freq: Once | OROMUCOSAL | Status: AC

## 2022-12-28 MED ORDER — DEXMEDETOMIDINE HCL IN NACL 80 MCG/20ML IV SOLN
INTRAVENOUS | Status: AC
  Filled 2022-12-28: qty 20

## 2022-12-28 MED ORDER — DEXMEDETOMIDINE HCL IN NACL 80 MCG/20ML IV SOLN
INTRAVENOUS | Status: DC | PRN
  Administered 2022-12-28 (×3): 4 ug via INTRAVENOUS

## 2022-12-28 MED ORDER — OXYCODONE HCL 5 MG PO TABS: 5.0000 mg | ORAL_TABLET | Freq: Once | ORAL | Status: DC | PRN

## 2022-12-28 MED ORDER — FENTANYL CITRATE (PF) 250 MCG/5ML IJ SOLN
INTRAMUSCULAR | Status: AC
  Filled 2022-12-28: qty 5

## 2022-12-28 MED ORDER — MIDAZOLAM HCL 2 MG/2ML IJ SOLN
INTRAMUSCULAR | Status: AC
  Filled 2022-12-28: qty 2

## 2022-12-28 MED ORDER — ROCURONIUM BROMIDE 10 MG/ML (PF) SYRINGE
PREFILLED_SYRINGE | INTRAVENOUS | Status: DC | PRN
  Administered 2022-12-28: 20 mg via INTRAVENOUS
  Administered 2022-12-28: 10 mg via INTRAVENOUS
  Administered 2022-12-28: 20 mg via INTRAVENOUS
  Administered 2022-12-28: 60 mg via INTRAVENOUS

## 2022-12-28 MED ORDER — ESMOLOL HCL 100 MG/10ML IV SOLN
INTRAVENOUS | Status: AC
  Filled 2022-12-28: qty 10

## 2022-12-28 MED ORDER — SODIUM CHLORIDE 0.9 % IV SOLN
2.0000 g | INTRAVENOUS | Status: AC
  Administered 2022-12-28 – 2022-12-29 (×2): 2 g via INTRAVENOUS
  Filled 2022-12-28 (×2): qty 20

## 2022-12-28 MED ORDER — LIDOCAINE 2% (20 MG/ML) 5 ML SYRINGE
INTRAMUSCULAR | Status: DC | PRN
  Administered 2022-12-28: 60 mg via INTRAVENOUS
  Administered 2022-12-28: 40 mg via INTRAVENOUS

## 2022-12-28 MED ORDER — EPHEDRINE SULFATE-NACL 50-0.9 MG/10ML-% IV SOSY
PREFILLED_SYRINGE | INTRAVENOUS | Status: DC | PRN
  Administered 2022-12-28: 5 mg via INTRAVENOUS

## 2022-12-28 MED ORDER — ROCURONIUM BROMIDE 10 MG/ML (PF) SYRINGE
PREFILLED_SYRINGE | INTRAVENOUS | Status: AC
  Filled 2022-12-28: qty 10

## 2022-12-28 MED ORDER — PROMETHAZINE HCL 25 MG/ML IJ SOLN: 6.2500 mg | INTRAMUSCULAR | Status: DC | PRN

## 2022-12-28 MED ORDER — FENTANYL CITRATE (PF) 250 MCG/5ML IJ SOLN
INTRAMUSCULAR | Status: DC | PRN
  Administered 2022-12-28 (×2): 50 ug via INTRAVENOUS

## 2022-12-28 MED ORDER — TRANEXAMIC ACID-NACL 1000-0.7 MG/100ML-% IV SOLN
1000.0000 mg | Freq: Once | INTRAVENOUS | Status: AC
  Administered 2022-12-28: 1000 mg via INTRAVENOUS
  Filled 2022-12-28 (×2): qty 100

## 2022-12-28 MED ORDER — CHLORHEXIDINE GLUCONATE 0.12 % MT SOLN
OROMUCOSAL | Status: AC
  Administered 2022-12-28: 15 mL via OROMUCOSAL
  Filled 2022-12-28: qty 15

## 2022-12-28 MED ORDER — PHENYLEPHRINE 80 MCG/ML (10ML) SYRINGE FOR IV PUSH (FOR BLOOD PRESSURE SUPPORT)
PREFILLED_SYRINGE | INTRAVENOUS | Status: DC | PRN
  Administered 2022-12-28 (×2): 80 ug via INTRAVENOUS

## 2022-12-28 MED ORDER — PHENYLEPHRINE HCL-NACL 20-0.9 MG/250ML-% IV SOLN
INTRAVENOUS | Status: DC | PRN
  Administered 2022-12-28: 10 ug/min via INTRAVENOUS

## 2022-12-28 MED ORDER — TRANEXAMIC ACID-NACL 1000-0.7 MG/100ML-% IV SOLN
INTRAVENOUS | Status: DC | PRN
  Administered 2022-12-28: 1000 mg via INTRAVENOUS

## 2022-12-28 MED ORDER — PHENYLEPHRINE HCL-NACL 20-0.9 MG/250ML-% IV SOLN
INTRAVENOUS | Status: AC
  Filled 2022-12-28: qty 250

## 2022-12-28 SURGICAL SUPPLY — 104 items
APL SKNCLS STERI-STRIP NONHPOA (GAUZE/BANDAGES/DRESSINGS)
BAG COUNTER SPONGE SURGICOUNT (BAG) IMPLANT
BAG SPNG CNTER NS LX DISP (BAG)
BANDAGE ESMARK 6X9 LF (GAUZE/BANDAGES/DRESSINGS) ×2 IMPLANT
BENZOIN TINCTURE PRP APPL 2/3 (GAUZE/BANDAGES/DRESSINGS) IMPLANT
BIT DRILL 2.5X2.75 QC CALB (BIT) ×1 IMPLANT
BIT DRILL 2.9 CANN QC NONSTRL (BIT) ×1 IMPLANT
BIT DRILL CALIBRATED 2.7 (BIT) ×1 IMPLANT
BNDG CMPR 5X4 CHSV STRCH STRL (GAUZE/BANDAGES/DRESSINGS) ×2
BNDG CMPR 5X4 KNIT ELC UNQ LF (GAUZE/BANDAGES/DRESSINGS) ×2
BNDG CMPR 6 X 5 YARDS HK CLSR (GAUZE/BANDAGES/DRESSINGS) ×2
BNDG CMPR 6"X 5 YARDS HK CLSR (GAUZE/BANDAGES/DRESSINGS) ×2
BNDG CMPR 9X6 STRL LF SNTH (GAUZE/BANDAGES/DRESSINGS)
BNDG COHESIVE 4X5 TAN STRL LF (GAUZE/BANDAGES/DRESSINGS) ×1 IMPLANT
BNDG ELASTIC 4INX 5YD STR LF (GAUZE/BANDAGES/DRESSINGS) ×1 IMPLANT
BNDG ELASTIC 4X5.8 VLCR STR LF (GAUZE/BANDAGES/DRESSINGS) ×2 IMPLANT
BNDG ELASTIC 6INX 5YD STR LF (GAUZE/BANDAGES/DRESSINGS) ×1 IMPLANT
BNDG ELASTIC 6X5.8 VLCR STR LF (GAUZE/BANDAGES/DRESSINGS) ×3 IMPLANT
BNDG ESMARK 6X9 LF (GAUZE/BANDAGES/DRESSINGS)
BNDG GAUZE DERMACEA FLUFF 4 (GAUZE/BANDAGES/DRESSINGS) ×6 IMPLANT
BNDG GZE DERMACEA 4 6PLY (GAUZE/BANDAGES/DRESSINGS) ×4
BOOTCOVER CLEANROOM LRG (PROTECTIVE WEAR) IMPLANT
CANISTER SUCT 3000ML PPV (MISCELLANEOUS) ×3 IMPLANT
CLEANER TIP ELECTROSURG 2X2 (MISCELLANEOUS) ×2 IMPLANT
CLSR STERI-STRIP ANTIMIC 1/2X4 (GAUZE/BANDAGES/DRESSINGS) IMPLANT
COVER SURGICAL LIGHT HANDLE (MISCELLANEOUS) ×3 IMPLANT
CUFF TOURN SGL QUICK 34 (TOURNIQUET CUFF) ×2
CUFF TOURN SGL QUICK 42 (TOURNIQUET CUFF) IMPLANT
CUFF TRNQT CYL 34X4.125X (TOURNIQUET CUFF) ×1 IMPLANT
DRAPE C-ARM 42X72 X-RAY (DRAPES) IMPLANT
DRAPE C-ARMOR (DRAPES) IMPLANT
DRAPE INCISE IOBAN 66X45 STRL (DRAPES) ×1 IMPLANT
DRAPE OEC MINIVIEW 54X84 (DRAPES) ×3 IMPLANT
DRAPE U-SHAPE 47X51 STRL (DRAPES) ×3 IMPLANT
DRSG ADAPTIC 3X8 NADH LF (GAUZE/BANDAGES/DRESSINGS) ×2 IMPLANT
DURAPREP 26ML APPLICATOR (WOUND CARE) ×4 IMPLANT
ELECT REM PT RETURN 9FT ADLT (ELECTROSURGICAL) ×2
ELECTRODE REM PT RTRN 9FT ADLT (ELECTROSURGICAL) ×3 IMPLANT
EVACUATOR 1/8 PVC DRAIN (DRAIN) IMPLANT
FIXATION ZIPTIGHT ANKLE SNDSMS (Ankle) ×1 IMPLANT
GAUZE PAD ABD 8X10 STRL (GAUZE/BANDAGES/DRESSINGS) ×6 IMPLANT
GAUZE SPONGE 4X4 12PLY STRL (GAUZE/BANDAGES/DRESSINGS) ×3 IMPLANT
GAUZE XEROFORM 1X8 LF (GAUZE/BANDAGES/DRESSINGS) ×4 IMPLANT
GAUZE XEROFORM 5X9 LF (GAUZE/BANDAGES/DRESSINGS) ×1 IMPLANT
GLOVE BIO SURGEON STRL SZ7 (GLOVE) ×8 IMPLANT
GLOVE BIOGEL PI IND STRL 7.0 (GLOVE) ×6 IMPLANT
GLOVE ORTHO TXT STRL SZ7.5 (GLOVE) ×3 IMPLANT
GOWN STRL REUS W/ TWL LRG LVL3 (GOWN DISPOSABLE) ×6 IMPLANT
GOWN STRL REUS W/ TWL XL LVL3 (GOWN DISPOSABLE) ×3 IMPLANT
GOWN STRL REUS W/TWL 2XL LVL3 (GOWN DISPOSABLE) ×3 IMPLANT
GOWN STRL REUS W/TWL LRG LVL3 (GOWN DISPOSABLE) ×4
GOWN STRL REUS W/TWL XL LVL3 (GOWN DISPOSABLE) ×2
HANDPIECE INTERPULSE COAX TIP (DISPOSABLE)
K-WIRE ACE 1.6X6 (WIRE) ×2
KIT BASIN OR (CUSTOM PROCEDURE TRAY) ×3 IMPLANT
KIT TURNOVER KIT B (KITS) ×3 IMPLANT
KWIRE ACE 1.6X6 (WIRE) ×1 IMPLANT
MANIFOLD NEPTUNE II (INSTRUMENTS) ×3 IMPLANT
MAT PREVALON FULL STRYKER (MISCELLANEOUS) ×1 IMPLANT
NDL 22X1.5 STRL (OR ONLY) (MISCELLANEOUS) IMPLANT
NDL HYPO 22X1.5 SAFETY MO (MISCELLANEOUS) IMPLANT
NEEDLE 22X1.5 STRL (OR ONLY) (MISCELLANEOUS) IMPLANT
NEEDLE HYPO 22X1.5 SAFETY MO (MISCELLANEOUS) IMPLANT
NS IRRIG 1000ML POUR BTL (IV SOLUTION) ×3 IMPLANT
PACK ORTHO EXTREMITY (CUSTOM PROCEDURE TRAY) ×3 IMPLANT
PAD ARMBOARD 7.5X6 YLW CONV (MISCELLANEOUS) ×6 IMPLANT
PAD CAST 4YDX4 CTTN HI CHSV (CAST SUPPLIES) ×6 IMPLANT
PADDING CAST COTTON 4X4 STRL (CAST SUPPLIES) ×4
PADDING CAST COTTON 6X4 STRL (CAST SUPPLIES) ×8 IMPLANT
PLATE LOCK 3H 95 RT DIST FIB (Plate) ×1 IMPLANT
SCREW ACE CAN 4.0 40M (Screw) ×1 IMPLANT
SCREW CORTICAL 3.5MM 18MM (Screw) ×1 IMPLANT
SCREW CORTICAL 3.5MM 28MM (Screw) ×1 IMPLANT
SCREW CORTICAL LOW PROF 3.5X20 (Screw) ×1 IMPLANT
SCREW LOCK CORT STAR 3.5X10 (Screw) ×1 IMPLANT
SCREW LOCK CORT STAR 3.5X14 (Screw) ×2 IMPLANT
SCREW LOCK CORT STAR 3.5X16 (Screw) ×2 IMPLANT
SCREW LOCK CORT STAR 3.5X20 (Screw) ×2 IMPLANT
SCREW LOW PROFILE 18MMX3.5MM (Screw) ×2 IMPLANT
SET HNDPC FAN SPRY TIP SCT (DISPOSABLE) IMPLANT
SPIKE FLUID TRANSFER (MISCELLANEOUS) IMPLANT
SPLINT PLASTER CAST FAST 5X30 (CAST SUPPLIES) ×1 IMPLANT
SPONGE T-LAP 18X18 ~~LOC~~+RFID (SPONGE) IMPLANT
SPONGE T-LAP 4X18 ~~LOC~~+RFID (SPONGE) ×2 IMPLANT
STAPLER VISISTAT 35W (STAPLE) IMPLANT
STOCKINETTE IMPERVIOUS LG (DRAPES) ×3 IMPLANT
SUCTION TUBE FRAZIER 10FR DISP (SUCTIONS) ×3 IMPLANT
SUT ETHILON 3 0 PS 1 (SUTURE) ×4 IMPLANT
SUT MNCRL AB 3-0 PS2 18 (SUTURE) ×2 IMPLANT
SUT VIC AB 0 CT1 27 (SUTURE)
SUT VIC AB 0 CT1 27XBRD ANBCTR (SUTURE) ×2 IMPLANT
SUT VIC AB 2-0 CT1 (SUTURE) ×1 IMPLANT
SUT VIC AB 3-0 SH 27 (SUTURE) ×2
SUT VIC AB 3-0 SH 27X BRD (SUTURE) ×1 IMPLANT
SUT VIC AB 3-0 SH 8-18 (SUTURE) ×2 IMPLANT
SYR CONTROL 10ML LL (SYRINGE) IMPLANT
TOWEL GREEN STERILE (TOWEL DISPOSABLE) ×6 IMPLANT
TOWEL GREEN STERILE FF (TOWEL DISPOSABLE) ×6 IMPLANT
TUBE CONNECTING 12X1/4 (SUCTIONS) ×3 IMPLANT
TUBING TUR DISP (UROLOGICAL SUPPLIES) ×2 IMPLANT
UNDERPAD 30X36 HEAVY ABSORB (UNDERPADS AND DIAPERS) ×2 IMPLANT
WATER STERILE IRR 1000ML POUR (IV SOLUTION) ×4 IMPLANT
YANKAUER SUCT BULB TIP NO VENT (SUCTIONS) ×2 IMPLANT
ZIPTIGHT ANKLE SYNODESMOSS FIX (Ankle) ×2 IMPLANT

## 2022-12-28 NOTE — Discharge Instructions (Signed)
Diet: As you were doing prior to hospitalization   Shower:  May shower but keep the wounds dry, use an occlusive plastic wrap, NO SOAKING IN TUB.  If the bandage gets wet, change with a clean dry gauze.  If you have a splint on, leave the splint in place and keep the splint dry with a plastic bag.  Dressing:  You may change your dressing 3-5 days after surgery, unless you have a splint.  If you have a splint, then just leave the splint in place and we will change your bandages during your first follow-up appointment.    If you had hand or foot surgery, we will plan to remove your stitches in about 2 weeks in the office.  For all other surgeries, there are sticky tapes (steri-strips) on your wounds and all the stitches are absorbable.  Leave the steri-strips in place when changing your dressings, they will peel off with time, usually 2-3 weeks.  Activity:  Increase activity slowly as tolerated, but follow the weight bearing instructions below.  The rules on driving is that you can not be taking narcotics while you drive, and you must feel in control of the vehicle.    Weight Bearing:   No bearing weight on right leg  To prevent constipation: you may use a stool softener such as -  Colace (over the counter) 100 mg by mouth twice a day  Drink plenty of fluids (prune juice may be helpful) and high fiber foods Miralax (over the counter) for constipation as needed.    Itching:  If you experience itching with your medications, try taking only a single pain pill, or even half a pain pill at a time.  You may take up to 10 pain pills per day, and you can also use benadryl over the counter for itching or also to help with sleep.   Precautions:  If you experience chest pain or shortness of breath - call 911 immediately for transfer to the hospital emergency department!!  If you develop a fever greater that 101 F, purulent drainage from wound, increased redness or drainage from wound, or calf pain -- Call  the office at [phone]                                                Follow- Up Appointment:  Please call for an appointment to be seen in 2 weeks [place] - [phone]

## 2022-12-28 NOTE — Anesthesia Procedure Notes (Signed)
Anesthesia Regional Block: Popliteal block   Pre-Anesthetic Checklist: , timeout performed,  Correct Patient, Correct Site, Correct Laterality,  Correct Procedure, Correct Position, site marked,  Risks and benefits discussed,  Surgical consent,  Pre-op evaluation,  At surgeon's request and post-op pain management  Laterality: Right  Prep: chloraprep       Needles:  Injection technique: Single-shot  Needle Type: Echogenic Needle     Needle Length: 10cm  Needle Gauge: 21     Additional Needles:   Narrative:  Start time: 12/28/2022 7:35 AM End time: 12/28/2022 7:38 AM Injection made incrementally with aspirations every 5 mL.  Performed by: Personally  Anesthesiologist: Beryle Lathe, MD  Additional Notes: No pain on injection. No increased resistance to injection. Injection made in 5cc increments. Good needle visualization. Patient tolerated the procedure well.

## 2022-12-28 NOTE — Transfer of Care (Signed)
Immediate Anesthesia Transfer of Care Note  Patient: Thomas [name]  Procedure(s) Performed: OPEN REDUCTION INTERNAL FIXATION (ORIF) ANKLE FRACTURE (Right: Ankle)  Patient Location: PACU  Anesthesia Type:General  Level of Consciousness: awake, alert , oriented, and patient cooperative  Airway & Oxygen Therapy: Patient Spontanous Breathing and Patient connected to face mask oxygen  Post-op Assessment: Report given to RN and Post -op Vital signs reviewed and stable  Post vital signs: Reviewed and stable  Last Vitals:  Vitals Value Taken Time  BP 105/52 12/28/22 1046  Temp    Pulse 66 12/28/22 1051  Resp 19 12/28/22 1051  SpO2 93 % 12/28/22 1051  Vitals shown include unfiled device data.  Last Pain:  Vitals:   12/28/22 0655  TempSrc:   PainSc: 0-No pain      Patients Stated Pain Goal: 0 (12/26/22 1720)  Complications: No notable events documented.

## 2022-12-28 NOTE — Progress Notes (Signed)
The risks benefits and alternatives were discussed with the patient including but not limited to the risks of nonoperative treatment, versus surgical intervention including infection, bleeding, nerve injury, malunion, nonunion, the need for revision surgery, hardware prominence, hardware failure, the need for hardware removal, blood clots, cardiopulmonary complications, morbidity, mortality, among others, and they were willing to proceed.    Plan for right ankle open reduction internal fixation versus external fixation depending on the skin condition.  We have discussed the risks for the potential for staged internal fixation depending on the soft tissue swelling.  Teryl Lucy, MD

## 2022-12-28 NOTE — Progress Notes (Signed)
PROGRESS NOTE    Thomas [name]  Thomas [name]:[id number] DOB: 11-25-1975 DOA: 12/25/2022 PCP: [name], MD   Brief Narrative: Thomas [name] is a 47 y.o. male with a history of cirrhosis, varices, portal hypertension, obesity, OSA, psoriatic arthritis.  Patient presented secondary to Nausea, vomiting and hematemesis concerning for upper GI bleeding related to known varices. GI consulted. Patient transfused blood as needed. Right ankle fracture managed as an outpatient evaluated by orthopedic surgery this admission and performed ORIF on 7/21.   Assessment and Plan:  Hematemesis Acute GI bleeding Patient has a history of varices with concern for possible variceal bleeding. Patient is also prescribed ibuprofen but cannot tell me if he has been using it. Hematemesis has now appeared to have resolved. GI consulted and performed an upper endoscopy on 7/19 which was significant for 3 large columns of non-bleeding esophageal varices s/p 4 band ligations in addition to a large clot /altered body in the fundus of the stomach (s/p suction and irrigation) and portal hypertensive gastropathy. -GI recommendations: Protonix, Octreotide, clear liquid diet, Ceftriaxone, Carafate suspension  Acute blood loss anemia Secondary to acute blood loss from GI bleeding. Baseline hemoglobin of 11. Hemoglobin of 7.0 on admission requiring 1 unit of PRBC. Hemoglobin down to 6.6 today after initial unit of PRBC requiring another unit of PRBC. A third unit of PRBC transfused pre-op with post-transfusion hemoglobin up to 8.2.  Right ankle fracture Diagnosed prior to admission. Required closed reduction in the ED with referral to orthopedic surgery for evaluation. Patient referred to Dr. Myrene Galas. Patient currently non weight bearing to right lower extremity. Orthopedic surgery consulted and performed ORIF on 7/21.  Presumed continued nonweightbearing, however will need to await orthopedic surgery  recommendations. -Follow-up orthopedic surgery recommendations -CBC in AM  Alcoholic cirrhosis Portal hypertension Noted. -Continue Propranolol and Protonix -Continue CIWA  OSA Noted. CPAP held secondary to nausea/vomiting  Psoriasis Psoriatic arthritis Not on treatment.  Obesity Estimated body mass index is 47.43 kg/m as calculated from the following:   Height as of this encounter: 5\' 5"  (1.651 m).   Weight as of this encounter: 129.3 kg.  DVT prophylaxis: SCDs Code Status:   Code Status: Full Code Family Communication: None Disposition Plan: Discharge home likely in 2+ days pending GI and orthopedic surgery recommendations   Consultants:  [facility]  Procedures:  7/19: Upper GI endoscopy  Antimicrobials: None    Subjective: Patient is back from surgery. No concerns at this moment.  Objective: BP (!) 110/57 (BP Location: Left Arm)   Pulse 61   Temp 98.1 F (36.7 C) (Oral)   Resp 14   Ht 5\' 5"  (1.651 m)   Wt 129.3 kg   SpO2 100%   BMI 47.43 kg/m   Examination:  General exam: Appears calm and comfortable Respiratory system: Clear to auscultation. Respiratory effort normal. Cardiovascular system: S1 & S2 heard, RRR. Gastrointestinal system: Abdomen is nondistended, soft and nontender. Normal bowel sounds heard. Central nervous system: Alert and oriented. No focal neurological deficits. Musculoskeletal: Right leg in surgical dressing. Psychiatry: Judgement and insight appear normal. Mood & affect appropriate.    Data Reviewed: I have personally reviewed following labs and imaging studies  CBC Lab Results  Component Value Date   WBC 4.9 12/28/2022   RBC 3.13 (L) 12/28/2022   HGB 8.1 (L) 12/28/2022   HCT 26.0 (L) 12/28/2022   MCV 83.1 12/28/2022   MCH 25.9 (L) 12/28/2022   PLT 127 (L) 12/28/2022   MCHC  31.2 12/28/2022   RDW 21.9 (H) 12/28/2022   LYMPHSABS 2.0 08/01/2021   MONOABS 1.1 (H) 08/01/2021   EOSABS 0.0 08/01/2021   BASOSABS 0.0  08/01/2021     Last metabolic panel Lab Results  Component Value Date   NA 140 12/26/2022   K 4.4 12/26/2022   CL 105 12/26/2022   CO2 28 12/26/2022   BUN 27 (H) 12/26/2022   CREATININE 0.92 12/26/2022   GLUCOSE 102 (H) 12/26/2022   GFRNONAA >60 12/26/2022   GFRAA >89 06/22/2013   CALCIUM 8.3 (L) 12/26/2022   PHOS 3.1 07/05/2022   PROT 4.8 (L) 12/26/2022   ALBUMIN 2.2 (L) 12/26/2022   BILITOT 2.6 (H) 12/26/2022   ALKPHOS 81 12/26/2022   AST 51 (H) 12/26/2022   ALT 31 12/26/2022   ANIONGAP 3 (L) 12/26/2022    GFR: Estimated Creatinine Clearance: 127.1 mL/min (by C-G formula based on SCr of 0.92 mg/dL).  Recent Results (from the past 240 hour(s))  Resp panel by RT-PCR (RSV, Flu A&B, Covid) Anterior Nasal Swab     Status: None   Collection Time: 12/25/22  4:30 PM   Specimen: Anterior Nasal Swab  Result Value Ref Range Status   SARS Coronavirus 2 by RT PCR NEGATIVE NEGATIVE Final   Influenza A by PCR NEGATIVE NEGATIVE Final   Influenza B by PCR NEGATIVE NEGATIVE Final    Comment: (NOTE) The Xpert Xpress SARS-CoV-2/FLU/RSV plus assay is intended as an aid in the diagnosis of influenza from Nasopharyngeal swab specimens and should not be used as a sole basis for treatment. Nasal washings and aspirates are unacceptable for Xpert Xpress SARS-CoV-2/FLU/RSV testing.  Fact Sheet for Patients: BloggerCourse.com  Fact Sheet for Healthcare Providers: SeriousBroker.it  This test is not yet approved or cleared by the [place] FDA and has been authorized for detection and/or diagnosis of SARS-CoV-2 by FDA under an Emergency Use Authorization (EUA). This EUA will remain in effect (meaning this test can be used) for the duration of the COVID-19 declaration under Section 564(b)(1) of the Act, 21 U.S.C. section 360bbb-3(b)(1), unless the authorization is terminated or revoked.     Resp Syncytial Virus by PCR NEGATIVE  NEGATIVE Final    Comment: (NOTE) Fact Sheet for Patients: BloggerCourse.com  Fact Sheet for Healthcare Providers: SeriousBroker.it  This test is not yet approved or cleared by the [place] FDA and has been authorized for detection and/or diagnosis of SARS-CoV-2 by FDA under an Emergency Use Authorization (EUA). This EUA will remain in effect (meaning this test can be used) for the duration of the COVID-19 declaration under Section 564(b)(1) of the Act, 21 U.S.C. section 360bbb-3(b)(1), unless the authorization is terminated or revoked.  Performed at [facility], [address] [place], [place] [address]   Surgical pcr screen     Status: None   Collection Time: 12/27/22 11:03 PM   Specimen: Nasal Mucosa; Nasal Swab  Result Value Ref Range Status   MRSA, PCR NEGATIVE NEGATIVE Final   Staphylococcus aureus NEGATIVE NEGATIVE Final    Comment: (NOTE) The Xpert SA Assay (FDA approved for NASAL specimens in patients 37 years of age and older), is one component of a comprehensive surveillance program. It is not intended to diagnose infection nor to guide or monitor treatment. Performed at [facility], [address] [place], [place] [address]       Radiology Studies: DG Ankle Right Port  Result Date: 12/28/2022 CLINICAL DATA:  Closed right ankle fracture. EXAM: PORTABLE  RIGHT ANKLE - 2 VIEW COMPARISON:  Right ankle radiographs 12/22/2022 FINDINGS: There has been interval ORIF of the previously described fractures of the distal fibula and medial malleolus as well as syndesmosis repair. A lateral fixation plate and screws have been placed in the distal fibula, and a single screw traverses the medial malleolus fracture. Alignment is now anatomic. A splint is in place. IMPRESSION: Interval ORIF of distal tibia and fibula fractures with anatomic alignment. Electronically Signed   By: Sebastian Ache M.D.   On: 12/28/2022  12:54   DG MINI C-ARM IMAGE ONLY  Result Date: 12/28/2022 There is no interpretation for this exam.  This order is for images obtained during a surgical procedure.  Please See "Surgeries" Tab for more information regarding the procedure.      LOS: 3 days    Jacquelin Hawking, MD [facility] 12/28/2022, 2:43 PM   If 7PM-7AM, please contact night-coverage [email or web address]

## 2022-12-28 NOTE — Anesthesia Postprocedure Evaluation (Signed)
Anesthesia Post Note  Patient: Thomas [name]  Procedure(s) Performed: OPEN REDUCTION INTERNAL FIXATION (ORIF) ANKLE FRACTURE (Right: Ankle)     Patient location during evaluation: PACU Anesthesia Type: General Level of consciousness: awake and alert Pain management: pain level controlled Vital Signs Assessment: post-procedure vital signs reviewed and stable Respiratory status: spontaneous breathing, nonlabored ventilation and respiratory function stable Cardiovascular status: stable and blood pressure returned to baseline Anesthetic complications: no   No notable events documented.  Last Vitals:  Vitals:   12/28/22 1100 12/28/22 1115  BP: (!) 102/57 113/60  Pulse: 68 65  Resp: 15 13  Temp:  36.4 C  SpO2: 97% 97%    Last Pain:  Vitals:   12/28/22 1115  TempSrc:   PainSc: Asleep                 Beryle Lathe

## 2022-12-28 NOTE — Progress Notes (Signed)
[facility] ROUNDING NOTE   Subjective: Denies any melena or hematemesis.  No nausea no vomiting or abdominal pain   Objective: Vital signs in last 24 hours: Temp:  [97.6 F (36.4 C)-98.8 F (37.1 C)] 98.8 F (37.1 C) (07/21 1734) Pulse Rate:  [59-95] 66 (07/21 1734) Resp:  [13-19] 18 (07/21 1734) BP: (94-128)/(41-65) 118/65 (07/21 1734) SpO2:  [92 %-100 %] 93 % (07/21 1734) Weight:  [129.3 kg] 129.3 kg (07/21 0640) Last BM Date : 12/25/22 General: NAD Abdomen: Soft, distended and nontender     Intake/Output from previous day: 07/20 0701 - 07/21 0700 In: 1112.7 [P.O.:240; I.V.:557.7; Blood:315] Out: -  Intake/Output this shift: Total I/O In: 1240 [P.O.:240; I.V.:800; IV Piggyback:200] Out: 625 [Urine:600; Blood:25]   Lab Results: Recent Labs    12/27/22 0023 12/27/22 0606 12/27/22 1408 12/27/22 2209 12/28/22 0046  WBC 4.6 4.5  --   --  4.9  HGB 7.1* 7.1* 7.3* 8.2* 8.1*  PLT 117* 121*  --   --  127*  MCV 88.0 87.6  --   --  83.1   BMET Recent Labs    12/26/22 0620 12/26/22 1657  NA 136 140  K 4.9 4.4  CL 105  --   CO2 28  --   GLUCOSE 102*  --   BUN 27*  --   CREATININE 0.92  --   CALCIUM 8.3*  --    LFT Recent Labs    12/26/22 0620  PROT 4.8*  ALBUMIN 2.2*  AST 51*  ALT 31  ALKPHOS 81  BILITOT 2.6*   PT/INR Recent Labs    12/26/22 0620  INR 1.5*      Imaging/Other results: DG Ankle Right Port  Result Date: 12/28/2022 CLINICAL DATA:  Closed right ankle fracture. EXAM: PORTABLE RIGHT ANKLE - 2 VIEW COMPARISON:  Right ankle radiographs 12/22/2022 FINDINGS: There has been interval ORIF of the previously described fractures of the distal fibula and medial malleolus as well as syndesmosis repair. A lateral fixation plate and screws have been placed in the distal fibula, and a single screw traverses the medial malleolus fracture. Alignment is now anatomic. A splint is in place. IMPRESSION: Interval ORIF of distal tibia and  fibula fractures with anatomic alignment. Electronically Signed   By: Sebastian Ache M.D.   On: 12/28/2022 12:54   DG MINI C-ARM IMAGE ONLY  Result Date: 12/28/2022 There is no interpretation for this exam.  This order is for images obtained during a surgical procedure.  Please See "Surgeries" Tab for more information regarding the procedure.      Assessment &Plan  47 year old male with alcohol induced cirrhosis, portal hypertensive gastropathy, esophageal and gastric varices admitted with hematemesis s/p esophageal variceal band ligation MELD 3.0: 16 at 12/26/2022  4:57 PM  Hemoglobin is stable, no evidence of ongoing GI bleed  Continue octreotide gtt. for 72 hours, transition to propranolol once he completes the octreotide PPI twice daily IV ceftriaxone for 5 days Soft diet for 2 weeks  Repeat EGD in 3 to 4 weeks for surveillance of esophageal varices and additional esophageal variceal band ligation if needed  No evidence of hepatic encephalopathy  Right ankle fracture s/p ORIF with internal fixation  Discussed alcohol cessation  GI will sign off, available if have any questions     K. Scherry Ran , MD [phone]  [facility]

## 2022-12-28 NOTE — Op Note (Signed)
12/25/2022 - 12/28/2022  PATIENT:  Thomas [name]    PRE-OPERATIVE DIAGNOSIS:  right bimalleolar ankle fracture with syndesmotic injury  POST-OPERATIVE DIAGNOSIS:  Same  PROCEDURE:  1.  ORIF bimalleolar ankle fracture with fixation of medial and lateral 2.  Open reduction internal fixation right syndesmosis 3.  3 views of the right ankle taken postoperatively demonstrating anatomic reconstruction  SURGEON:  Eulas Post, MD  PHYSICIAN ASSISTANT: Janine Ores, PA-C, present and scrubbed throughout the case, critical for completion in a timely fashion, and for retraction, instrumentation, and closure.  ANESTHESIA:   General  ESTIMATED BLOOD LOSS: Minimal  PREOPERATIVE INDICATIONS:  Thomas [name] is a  47 y.o. male with a diagnosis of displaced right ankle fracture who elected for surgical management to minimize the risk for malunion and nonunion and post-traumatic arthritis.  He also had psoriasis with some plaques over the distal tibia, but not in the actual incision line.  He had significant displacement, and presented on a subacute basis with some subluxation, and also a GI bleed, he was optimized and then brought to surgery.  The risks benefits and alternatives were discussed with the patient preoperatively including but not limited to the risks of infection, bleeding, nerve injury, cardiopulmonary complications, the need for revision surgery, the need for hardware removal, among others, and the patient was willing to proceed.  OPERATIVE IMPLANTS: Zimmer titanium distal anatomic plate, with a single interfragmentary screw, and 1 4.0 mm cannulated screw for the medial malleolus.  OPERATIVE PROCEDURE: The patient was brought to the operating room and placed in the supine position. All bony prominences were padded. General anesthesia was administered. The lower extremity was prepped and draped in the usual sterile fashion.  Tourniquet was not utilized. Time out was performed.   Incision  was made over the distal fibula and the fracture was exposed and reduced anatomically with a clamp.  There was a large segment of bone anteriorly on the distal fibula which I actually removed, cleaned on the back table, and after keying in the distal fibula I placed that bone fragment back in its anatomic position.  This was actually able to be captured with some of the anterior locking screws.  First however, I used an interfragmentary position screw.  I reduced the loose fragment, and then I then applied a anatomic locking plate and secured it proximally with non-locking screws and then distally with locking screws. Bone quality was reasonably good. I used c-arm to confirm satisfactory reduction and fixation.  I confirmed length restoration on the fibula.  I then turned my attention to the medial malleolus. Incision was made over the medial malleolus and the fracture exposed and held provisionally with a clamp.  A guidepin was placed for the 4.0 mm cannulated screw and then confirmation of reduction was made with fluoroscopy.  The piece was fairly large, and I could fit 2 screws, but felt that 1 screw would give the optimal position.  I overreamed the K wire, although during the overreaming process it advanced this K wire, I had to unreduced the fracture, removed the K wire from the fracture site, and then replace the K wire after reducing the fracture again.  I then placed the screw, it had satisfactory fixation, although the titanium screws thread was not as wide as I would like, but ultimately had satisfactory fixation.    I elected to repair the syndesmosis particularly because the anterior portion of the distal fibula where the syndesmotic ligaments was disrupted, and  given his BMI, as well as his potential proclivity for weightbearing given his alcoholism, I wanted to augment fixation.  A guidewire was introduced across the syndesmosis, and visualized medially, was exiting just proximal to the medial  fracture line.  I drilled over the guidewire, passed the button, flipped, tensioned it appropriately.  Final C-arm pictures were taken, 3 views that demonstrate anatomic reconstruction of the right ankle.  The wounds were irrigated, and closed with vicryl with routine closure for the skin. Sterile gauze was applied followed by a posterior splint. He was awakened and returned to the PACU in stable and satisfactory condition. There were no complications.

## 2022-12-28 NOTE — Anesthesia Procedure Notes (Signed)
Procedure Name: Intubation Date/Time: 12/28/2022 8:00 AM  Performed by: Wilder Glade, CRNAPre-anesthesia Checklist: Patient identified, Emergency Drugs available, Suction available, Patient being monitored and Timeout performed Patient Re-evaluated:Patient Re-evaluated prior to induction Oxygen Delivery Method: Circle system utilized Preoxygenation: Pre-oxygenation with 100% oxygen Induction Type: IV induction Ventilation: Mask ventilation without difficulty Laryngoscope Size: Miller and 2 Grade View: Grade I Tube type: Oral Tube size: 7.5 mm Number of attempts: 1 Airway Equipment and Method: Stylet Placement Confirmation: ETT inserted through vocal cords under direct vision, positive ETCO2 and breath sounds checked- equal and bilateral Secured at: 23 cm Tube secured with: Tape Dental Injury: Teeth and Oropharynx as per pre-operative assessment  Comments: Brief atraumatic dentition unchanged

## 2022-12-28 NOTE — Anesthesia Procedure Notes (Addendum)
Anesthesia Regional Block: Adductor canal block   Pre-Anesthetic Checklist: , timeout performed,  Correct Patient, Correct Site, Correct Laterality,  Correct Procedure, Correct Position, site marked,  Risks and benefits discussed,  Surgical consent,  Pre-op evaluation,  At surgeon's request and post-op pain management  Laterality: Right  Prep: chloraprep       Needles:  Injection technique: Single-shot  Needle Type: Echogenic Needle     Needle Length: 10cm  Needle Gauge: 21     Additional Needles:   Narrative:  Start time: 12/28/2022 7:31 AM End time: 12/28/2022 7:34 AM Injection made incrementally with aspirations every 5 mL.  Performed by: Personally  Anesthesiologist: Beryle Lathe, MD  Additional Notes: No pain on injection. No increased resistance to injection. Injection made in 5cc increments. Good needle visualization. Patient tolerated the procedure well.

## 2022-12-29 ENCOUNTER — Encounter (HOSPITAL_COMMUNITY): Payer: Self-pay | Admitting: Orthopedic Surgery

## 2022-12-29 DIAGNOSIS — K920 Hematemesis: Secondary | ICD-10-CM | POA: Diagnosis not present

## 2022-12-29 DIAGNOSIS — K7030 Alcoholic cirrhosis of liver without ascites: Secondary | ICD-10-CM | POA: Diagnosis not present

## 2022-12-29 DIAGNOSIS — I8510 Secondary esophageal varices without bleeding: Secondary | ICD-10-CM | POA: Diagnosis not present

## 2022-12-29 DIAGNOSIS — K922 Gastrointestinal hemorrhage, unspecified: Secondary | ICD-10-CM | POA: Diagnosis not present

## 2022-12-29 LAB — CBC
HCT: 24.7 % — ABNORMAL LOW (ref 39.0–52.0)
Hemoglobin: 7.5 g/dL — ABNORMAL LOW (ref 13.0–17.0)
MCH: 24.4 pg — ABNORMAL LOW (ref 26.0–34.0)
MCHC: 30.4 g/dL (ref 30.0–36.0)
MCV: 80.2 fL (ref 80.0–100.0)
Platelets: 117 10*3/uL — ABNORMAL LOW (ref 150–400)
RBC: 3.08 MIL/uL — ABNORMAL LOW (ref 4.22–5.81)
RDW: 20.6 % — ABNORMAL HIGH (ref 11.5–15.5)
WBC: 5.8 10*3/uL (ref 4.0–10.5)
nRBC: 0 % (ref 0.0–0.2)

## 2022-12-29 LAB — BASIC METABOLIC PANEL
Anion gap: 6 (ref 5–15)
BUN: 12 mg/dL (ref 6–20)
CO2: 25 mmol/L (ref 22–32)
Calcium: 7.4 mg/dL — ABNORMAL LOW (ref 8.9–10.3)
Chloride: 104 mmol/L (ref 98–111)
Creatinine, Ser: 0.99 mg/dL (ref 0.61–1.24)
GFR, Estimated: >60 mL/min (ref >60)
Glucose, Bld: 109 mg/dL — ABNORMAL HIGH (ref 70–99)
Potassium: 4.3 mmol/L (ref 3.5–5.1)
Sodium: 135 mmol/L (ref 135–145)

## 2022-12-29 LAB — HEMOGLOBIN AND HEMATOCRIT, BLOOD
HCT: 25.5 % — ABNORMAL LOW (ref 39.0–52.0)
Hemoglobin: 7.8 g/dL — ABNORMAL LOW (ref 13.0–17.0)

## 2022-12-29 MED ORDER — OXYCODONE HCL 5 MG PO TABS: 5.0000 mg | ORAL_TABLET | ORAL | Status: DC | PRN

## 2022-12-29 MED ORDER — ACETAMINOPHEN 650 MG RE SUPP: 650.0000 mg | Freq: Four times a day (QID) | RECTAL | Status: DC | PRN

## 2022-12-29 MED ORDER — OXYCODONE HCL 5 MG PO TABS
5.0000 mg | ORAL_TABLET | ORAL | Status: DC | PRN
  Administered 2022-12-29 – 2022-12-30 (×4): 10 mg via ORAL
  Administered 2022-12-30: 5 mg via ORAL
  Administered 2022-12-30 (×2): 10 mg via ORAL
  Administered 2022-12-31: 5 mg via ORAL
  Administered 2022-12-31 – 2023-01-02 (×5): 10 mg via ORAL
  Administered 2023-01-03 – 2023-01-05 (×2): 5 mg via ORAL
  Administered 2023-01-06: 10 mg via ORAL
  Filled 2022-12-29 (×9): qty 2
  Filled 2022-12-29: qty 1
  Filled 2022-12-29 (×2): qty 2
  Filled 2022-12-29: qty 1
  Filled 2022-12-29 (×2): qty 2
  Filled 2022-12-29: qty 1
  Filled 2022-12-29: qty 2
  Filled 2022-12-29: qty 1

## 2022-12-29 MED ORDER — ACETAMINOPHEN 325 MG PO TABS
650.0000 mg | ORAL_TABLET | ORAL | Status: DC | PRN
  Administered 2023-01-03 – 2023-01-06 (×4): 650 mg via ORAL
  Filled 2022-12-29 (×5): qty 2

## 2022-12-29 MED ORDER — METHOCARBAMOL 500 MG PO TABS: 500.0000 mg | ORAL_TABLET | Freq: Four times a day (QID) | ORAL | Status: DC | PRN

## 2022-12-29 MED ORDER — OXYCODONE HCL 5 MG PO TABS: 5.0000 mg | ORAL_TABLET | Freq: Four times a day (QID) | ORAL | Status: DC | PRN

## 2022-12-29 MED ORDER — HYDROMORPHONE HCL 1 MG/ML IJ SOLN: 0.5000 mg | INTRAMUSCULAR | Status: DC | PRN

## 2022-12-29 MED ORDER — PANTOPRAZOLE SODIUM 40 MG PO TBEC
40.0000 mg | DELAYED_RELEASE_TABLET | Freq: Two times a day (BID) | ORAL | Status: DC
  Administered 2022-12-29 – 2023-01-07 (×18): 40 mg via ORAL
  Filled 2022-12-29 (×19): qty 1

## 2022-12-29 NOTE — Progress Notes (Signed)
PROGRESS NOTE    JAMA KRICHBAUM  WUJ:[id number] DOB: [birth year] DOA: 12/25/2022 PCP: [name], MD   Brief Narrative: Thomas [name] is a 47 y.o. male with a history of cirrhosis, varices, portal hypertension, obesity, OSA, psoriatic arthritis.  Patient presented secondary to Nausea, vomiting and hematemesis concerning for upper GI bleeding related to known varices. GI consulted. Patient transfused blood as needed. Right ankle fracture managed as an outpatient evaluated by orthopedic surgery this admission and performed ORIF on 7/21.   Assessment and Plan:  Hematemesis Acute GI bleeding Patient has a history of varices with concern for possible variceal bleeding. Patient is also prescribed ibuprofen but cannot tell me if he has been using it. Hematemesis has now appeared to have resolved. GI consulted and performed an upper endoscopy on 7/19 which was significant for 3 large columns of non-bleeding esophageal varices s/p 4 band ligations in addition to a large clot /altered body in the fundus of the stomach (s/p suction and irrigation) and portal hypertensive gastropathy. -GI recommendations: Protonix, Octreotide, clear liquid diet, Ceftriaxone, Carafate suspension. Recommendations pending today  Acute blood loss anemia Secondary to acute blood loss from GI bleeding. Baseline hemoglobin of 11. Hemoglobin of 7.0 on admission requiring 1 unit of PRBC. Hemoglobin down to 6.6 today after initial unit of PRBC requiring another unit of PRBC. A third unit of PRBC transfused pre-op with post-transfusion hemoglobin up to 8.2. Hemoglobin down to 7.5 in setting of recent surgery. Possibly a component of post-operative anemia. -CBC in AM; H&H this afternoon  Right ankle fracture Diagnosed prior to admission. Required closed reduction in the ED with referral to orthopedic surgery for evaluation. Patient referred to Dr. Myrene Galas. Patient currently non weight bearing to right lower  extremity. Orthopedic surgery consulted and performed ORIF on 7/21.  -Orthopedic surgery recommendations: Non-weight bearing RLE/keep elevated when in bed, follow-up with Dr. Dion Saucier in 2 weeks -CBC in AM  Alcoholic cirrhosis Portal hypertension Noted. -Continue Propranolol and Protonix -Continue CIWA  Thrombocytopenia Worsened in setting of acute bleeding. Stable.  OSA Noted. CPAP held secondary to nausea/vomiting  Psoriasis Psoriatic arthritis Not on treatment.  Obesity Estimated body mass index is 47.43 kg/m as calculated from the following:   Height as of this encounter: 5\' 5"  (1.651 m).   Weight as of this encounter: 129.3 kg.  DVT prophylaxis: SCDs; Will need outpatient VTE prophylaxis recommendations for outpatient. Code Status:   Code Status: Full Code Family Communication: None Disposition Plan: Discharge home likely in 2 days pending GI and orthopedic surgery recommendations in addition to stable hemoglobin.   Consultants:  [facility]  Procedures:  7/19: Upper GI endoscopy  Antimicrobials: None    Subjective: Having some feeling come back to his right leg. No bowel movement. No emesis. No other concerns.  Objective: BP (!) 107/57 (BP Location: Left Arm)   Pulse 76   Temp 98.1 F (36.7 C) (Oral)   Resp 20   Ht 5\' 5"  (1.651 m)   Wt 129.3 kg   SpO2 97%   BMI 47.43 kg/m   Examination:  General exam: Appears calm and comfortable Respiratory system: Clear to auscultation. Respiratory effort normal. Cardiovascular system: S1 & S2 heard, RRR. Gastrointestinal system: Abdomen is non-distended, soft and nontender. Normal bowel sounds heard. Central nervous system: Alert and oriented. No focal neurological deficits. Musculoskeletal: Right leg in plint/surgical dressing Psychiatry: Judgement and insight appear normal. Mood & affect appropriate.    Data Reviewed: I have personally reviewed following  labs and imaging studies  CBC Lab Results   Component Value Date   WBC 5.8 12/29/2022   RBC 3.08 (L) 12/29/2022   HGB 7.5 (L) 12/29/2022   HCT 24.7 (L) 12/29/2022   MCV 80.2 12/29/2022   MCH 24.4 (L) 12/29/2022   PLT 117 (L) 12/29/2022   MCHC 30.4 12/29/2022   RDW 20.6 (H) 12/29/2022   LYMPHSABS 2.0 08/01/2021   MONOABS 1.1 (H) 08/01/2021   EOSABS 0.0 08/01/2021   BASOSABS 0.0 08/01/2021     Last metabolic panel Lab Results  Component Value Date   NA 135 12/29/2022   K 4.3 12/29/2022   CL 104 12/29/2022   CO2 25 12/29/2022   BUN 12 12/29/2022   CREATININE 0.99 12/29/2022   GLUCOSE 109 (H) 12/29/2022   GFRNONAA >60 12/29/2022   GFRAA >89 06/22/2013   CALCIUM 7.4 (L) 12/29/2022   PHOS 3.1 07/05/2022   PROT 4.8 (L) 12/26/2022   ALBUMIN 2.2 (L) 12/26/2022   BILITOT 2.6 (H) 12/26/2022   ALKPHOS 81 12/26/2022   AST 51 (H) 12/26/2022   ALT 31 12/26/2022   ANIONGAP 6 12/29/2022    GFR: Estimated Creatinine Clearance: 118.1 mL/min (by C-G formula based on SCr of 0.99 mg/dL).  Recent Results (from the past 240 hour(s))  Resp panel by RT-PCR (RSV, Flu A&B, Covid) Anterior Nasal Swab     Status: None   Collection Time: 12/25/22  4:30 PM   Specimen: Anterior Nasal Swab  Result Value Ref Range Status   SARS Coronavirus 2 by RT PCR NEGATIVE NEGATIVE Final   Influenza A by PCR NEGATIVE NEGATIVE Final   Influenza B by PCR NEGATIVE NEGATIVE Final    Comment: (NOTE) The Xpert Xpress SARS-CoV-2/FLU/RSV plus assay is intended as an aid in the diagnosis of influenza from Nasopharyngeal swab specimens and should not be used as a sole basis for treatment. Nasal washings and aspirates are unacceptable for Xpert Xpress SARS-CoV-2/FLU/RSV testing.  Fact Sheet for Patients: BloggerCourse.com  Fact Sheet for Healthcare Providers: SeriousBroker.it  This test is not yet approved or cleared by the [place] FDA and has been authorized for detection and/or diagnosis of  SARS-CoV-2 by FDA under an Emergency Use Authorization (EUA). This EUA will remain in effect (meaning this test can be used) for the duration of the COVID-19 declaration under Section 564(b)(1) of the Act, 21 U.S.C. section 360bbb-3(b)(1), unless the authorization is terminated or revoked.     Resp Syncytial Virus by PCR NEGATIVE NEGATIVE Final    Comment: (NOTE) Fact Sheet for Patients: BloggerCourse.com  Fact Sheet for Healthcare Providers: SeriousBroker.it  This test is not yet approved or cleared by the [place] FDA and has been authorized for detection and/or diagnosis of SARS-CoV-2 by FDA under an Emergency Use Authorization (EUA). This EUA will remain in effect (meaning this test can be used) for the duration of the COVID-19 declaration under Section 564(b)(1) of the Act, 21 U.S.C. section 360bbb-3(b)(1), unless the authorization is terminated or revoked.  Performed at [facility], [address] [place], [place] [address]   Surgical pcr screen     Status: None   Collection Time: 12/27/22 11:03 PM   Specimen: Nasal Mucosa; Nasal Swab  Result Value Ref Range Status   MRSA, PCR NEGATIVE NEGATIVE Final   Staphylococcus aureus NEGATIVE NEGATIVE Final    Comment: (NOTE) The Xpert SA Assay (FDA approved for NASAL specimens in patients 75 years of age and older), is one component of a comprehensive  surveillance program. It is not intended to diagnose infection nor to guide or monitor treatment. Performed at [facility], [address], [place], [place] [address]       Radiology Studies: DG Ankle Right Port  Result Date: 12/28/2022 CLINICAL DATA:  Closed right ankle fracture. EXAM: PORTABLE RIGHT ANKLE - 2 VIEW COMPARISON:  Right ankle radiographs 12/22/2022 FINDINGS: There has been interval ORIF of the previously described fractures of the distal fibula and medial malleolus as well as syndesmosis  repair. A lateral fixation plate and screws have been placed in the distal fibula, and a single screw traverses the medial malleolus fracture. Alignment is now anatomic. A splint is in place. IMPRESSION: Interval ORIF of distal tibia and fibula fractures with anatomic alignment. Electronically Signed   By: Sebastian Ache M.D.   On: 12/28/2022 12:54   DG MINI C-ARM IMAGE ONLY  Result Date: 12/28/2022 There is no interpretation for this exam.  This order is for images obtained during a surgical procedure.  Please See "Surgeries" Tab for more information regarding the procedure.      LOS: 4 days    Jacquelin Hawking, MD [facility] 12/29/2022, 10:43 AM   If 7PM-7AM, please contact night-coverage [email or web address]

## 2022-12-29 NOTE — Evaluation (Signed)
Physical Therapy Evaluation Patient Details Name: Thomas [name] MRN: [id number] DOB: [birth year] Today's Date: 12/29/2022  History of Present Illness  Patient is a 47 y/o male admitted 12/25/22 due to upper GI bleed now s/p EGD on 7/19 with banding of esophageal varices.  Patient also with recent R ankle bimalleolar fx and underwent ORIF on 12/28/22.  PMH positive for obesity, sleep apnea, psoriatic arthritis, ETOH related cirrhosis, portal hypertensive gastropathy, esophageal and gastric varices.  Clinical Impression  Patient presents with decreased mobility due to pain in R foot, decreased balance with NWB on R LE, decreased activity tolerance and decreased safety awareness/knowledge of DME.  Currently needing min A for in room ambulation with rollator (which he reports was only walker he could use safely.)  He needed help to keep walker from rolling when hopping forward.  Patient reports at home was managing mainly with wheelchair since his ankle was broken and bumping down the entry steps on his bottom with his father's help as well as utlizing a desk chair.  Prior to his ankle injury he was independent.  Patient works from home at the top of his three story home.  Feel he will benefit from skilled PT in the acute setting prior to d/c home with HHPT and HHaide.          Assistance Recommended at Discharge Intermittent Supervision/Assistance  If plan is discharge home, recommend the following:  Can travel by private vehicle  A little help with walking and/or transfers;A little help with bathing/dressing/bathroom;Assistance with cooking/housework;Assist for transportation;Help with stairs or ramp for entrance        Equipment Recommendations Rolling walker (2 wheels);Hospital bed;BSC/3in1 (wide RW and 3:1 please)  Recommendations for Other Services       Functional Status Assessment Patient has had a recent decline in their functional status and demonstrates the ability to make significant  improvements in function in a reasonable and predictable amount of time.     Precautions / Restrictions Precautions Precautions: Fall Required Braces or Orthoses: Splint/Cast Splint/Cast: R foot/ankle Restrictions Weight Bearing Restrictions: Yes RLE Weight Bearing: Non weight bearing      Mobility  Bed Mobility Overal bed mobility: Modified Independent                  Transfers Overall transfer level: Needs assistance Equipment used: Rollator (4 wheels) Transfers: Sit to/from Stand Sit to Stand: Min guard           General transfer comment: pulls up on rollator, cues for safety, assist to keep walker still    Ambulation/Gait Ambulation/Gait assistance: Min assist Gait Distance (Feet): 25 Feet Assistive device: Rollator (4 wheels) Gait Pattern/deviations: Step-to pattern, Trunk flexed       General Gait Details: PT holding walker during pt hopping due to brakes not locking well.  VSS throughout on RA  Stairs            Wheelchair Mobility     Tilt Bed    Modified Rankin (Stroke Patients Only)       Balance Overall balance assessment: Needs assistance   Sitting balance-Leahy Scale: Good     Standing balance support: Bilateral upper extremity supported Standing balance-Leahy Scale: Poor Standing balance comment: UE support due to NWB R LE                             Pertinent Vitals/Pain Pain Assessment Pain Assessment: Faces Faces Pain Scale: Hurts little  more Pain Location: L foot/ankle Pain Descriptors / Indicators: Discomfort, Tingling (itching) Pain Intervention(s): Monitored during session, Repositioned    Home Living Family/patient expects to be discharged to:: Private residence Living Arrangements: Parent (dad lives with him) Available Help at Discharge: Family Type of Home: House Home Access: Stairs to enter   [employer] of Steps: can enter from garage 3 steps no rail, main entrance 3 brick  steps with porch, has metal rail on outside of platform with wooden post holding up awning Alternate Level Stairs-Number of Steps: half bath on first floor no shower, was sleeping in recliner; shower is up 9-10 steps then landing then 6-7 steps to bathrooms Home Layout: Multi-level Home Equipment: Crutches;Wheelchair - manual      Prior Function Prior Level of Function : Independent/Modified Independent             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       Hand Dominance   Dominant Hand: Right    Extremity/Trunk Assessment   Upper Extremity Assessment Upper Extremity Assessment: Overall WFL for tasks assessed    Lower Extremity Assessment Lower Extremity Assessment: RLE deficits/detail RLE Deficits / Details: lifts leg antigravity, wiggles toes and can feel touch to toes, noted cast on ankle/foot    Cervical / Trunk Assessment Cervical / Trunk Assessment: Other exceptions Cervical / Trunk Exceptions: increased body habitus  Communication   Communication: No difficulties  Cognition Arousal/Alertness: Awake/alert Behavior During Therapy: WFL for tasks assessed/performed Overall Cognitive Status: Within Functional Limits for tasks assessed                                          General Comments General comments (skin integrity, edema, etc.): Pt noted soreness and obvious ecchymosis around distal thigh likely where tourniquet was during surgery.    Exercises     Assessment/Plan    PT Assessment Patient needs continued PT services  PT Problem List Decreased balance;Decreased mobility;Decreased knowledge of use of DME;Decreased activity tolerance       PT Treatment Interventions DME instruction;Functional mobility training;Balance training;Patient/family education;Therapeutic activities;Gait training;Stair training;Therapeutic exercise;Wheelchair mobility training    PT Goals (Current goals  can be found in the Care Plan section)  Acute Rehab PT Goals Patient Stated Goal: to return home and mobilize safely PT Goal Formulation: With patient Time For Goal Achievement: 01/12/23 Potential to Achieve Goals: Good    Frequency Min 1X/week     Co-evaluation               AM-PAC PT "6 Clicks" Mobility  Outcome Measure Help needed turning from your back to your side while in a flat bed without using bedrails?: A Little Help needed moving from lying on your back to sitting on the side of a flat bed without using bedrails?: A Little Help needed moving to and from a bed to a chair (including a wheelchair)?: A Little Help needed standing up from a chair using your arms (e.g., wheelchair or bedside chair)?: A Little Help needed to walk in hospital room?: A Little Help needed climbing 3-5 steps with a railing? : Total 6 Click Score: 16    End of Session Equipment Utilized During Treatment: Gait belt Activity Tolerance: Patient tolerated treatment well Patient left: in chair;with chair alarm set   PT Visit Diagnosis: Other abnormalities of gait and mobility (R26.89);Difficulty  in walking, not elsewhere classified (R26.2);Pain Pain - Right/Left: Right Pain - part of body: Ankle and joints of foot    Time: 1030-1107 PT Time Calculation (min) (ACUTE ONLY): 37 min   Charges:   PT Evaluation $PT Eval Moderate Complexity: 1 Mod PT Treatments $Gait Training: 8-22 mins PT General Charges $$ ACUTE PT VISIT: 1 Visit         Sheran Lawless, PT Acute Rehabilitation Services Office:[phone] 12/29/2022   Elray Mcgregor 12/29/2022, 1:14 PM

## 2022-12-29 NOTE — Progress Notes (Signed)
Subjective: 1 Day Post-Op s/p Procedure(s): OPEN REDUCTION INTERNAL FIXATION (ORIF) ANKLE FRACTURE   Patient is alert, oriented. Pain well controlled.  Denies chest pain, SOB, Calf pain. No nausea/vomiting. No other complaints.    Objective:  PE: VITALS:   Vitals:   12/28/22 2005 12/28/22 2318 12/29/22 0416 12/29/22 0757  BP: (!) 109/54 (!) 100/48 (!) 104/49 (!) 107/57  Pulse: 71 75 70 76  Resp: 20 19 15 20   Temp: 98.2 F (36.8 C) 98.5 F (36.9 C) 98.1 F (36.7 C) 98.1 F (36.7 C)  TempSrc: Oral Oral Oral Oral  SpO2: 100% 99% 97% 97%  Weight:      Height:       General: laying in bed, in no acute distress MSK: RLE in splint. Able to flex and extend all toes. Cap refill intact. Sensation intact to all toes.   LABS  Results for orders placed or performed during the hospital encounter of 12/25/22 (from the past 24 hour(s))  Basic metabolic panel     Status: Abnormal   Collection Time: 12/29/22  2:10 AM  Result Value Ref Range   Sodium 135 135 - 145 mmol/L   Potassium 4.3 3.5 - 5.1 mmol/L   Chloride 104 98 - 111 mmol/L   CO2 25 22 - 32 mmol/L   Glucose, Bld 109 (H) 70 - 99 mg/dL   BUN 12 6 - 20 mg/dL   Creatinine, Ser 3.66 0.61 - 1.24 mg/dL   Calcium 7.4 (L) 8.9 - 10.3 mg/dL   GFR, Estimated >44 >03 mL/min   Anion gap 6 5 - 15  CBC     Status: Abnormal   Collection Time: 12/29/22  2:10 AM  Result Value Ref Range   WBC 5.8 4.0 - 10.5 K/uL   RBC 3.08 (L) 4.22 - 5.81 MIL/uL   Hemoglobin 7.5 (L) 13.0 - 17.0 g/dL   HCT 47.4 (L) 25.9 - 56.3 %   MCV 80.2 80.0 - 100.0 fL   MCH 24.4 (L) 26.0 - 34.0 pg   MCHC 30.4 30.0 - 36.0 g/dL   RDW 87.5 (H) 64.3 - 32.9 %   Platelets 117 (L) 150 - 400 K/uL   nRBC 0.0 0.0 - 0.2 %    DG Ankle Right Port  Result Date: 12/28/2022 CLINICAL DATA:  Closed right ankle fracture. EXAM: PORTABLE RIGHT ANKLE - 2 VIEW COMPARISON:  Right ankle radiographs 12/22/2022 FINDINGS: There has been interval ORIF of the previously described  fractures of the distal fibula and medial malleolus as well as syndesmosis repair. A lateral fixation plate and screws have been placed in the distal fibula, and a single screw traverses the medial malleolus fracture. Alignment is now anatomic. A splint is in place. IMPRESSION: Interval ORIF of distal tibia and fibula fractures with anatomic alignment. Electronically Signed   By: Sebastian Ache M.D.   On: 12/28/2022 12:54   DG MINI C-ARM IMAGE ONLY  Result Date: 12/28/2022 There is no interpretation for this exam.  This order is for images obtained during a surgical procedure.  Please See "Surgeries" Tab for more information regarding the procedure.    Assessment/Plan: Right bimalleolar ankle fracture   1 Day Post-Op s/p Procedure(s): OPEN REDUCTION INTERNAL FIXATION (ORIF) ANKLE FRACTURE - patient was transfused prior to surgery yesterday, Hbg dropped to 7.5 today  Weightbearing: NWB RLE, up with PT. Elevate RLE while in bed.  Insicional and dressing care: Reinforce dressings as needed VTE prophylaxis: Will defer DVT prophylaxis while inpatient  to medicine/GI teams in setting of GI bleed.  Pain control: continue current regimen Follow - up plan: 2 weeks with Dr. Dion Saucier Dispo: pending PT eval, ok to discharge home from ortho standpoint when able to perform transfers and compete a few stairs with PT. Patient found prior to admission that using a Rolator worked best for him.   Contact information:   Janine Ores, Cordelia Poche [id number]  After hours and holidays please check [email or web address] for group call information for [facility]  Armida Sans 12/29/2022, 8:15 AM

## 2022-12-29 NOTE — Progress Notes (Signed)
PHARMACIST - PHYSICIAN COMMUNICATION  DR:   Lonny Prude  CONCERNING: IV to Oral Route Change Policy  RECOMMENDATION: This patient is receiving Protonix by the intravenous route.  Based on criteria approved by the Pharmacy and Therapeutics Committee, the intravenous medication(s) is/are being converted to the equivalent oral dose form(s).   DESCRIPTION: These criteria include: The patient is eating (either orally or via tube) and/or has been taking other orally administered medications for a least 24 hours The patient has no evidence of active gastrointestinal bleeding or impaired GI absorption (gastrectomy, short bowel, patient on TNA or NPO).  If you have questions about this conversion, please contact the Pharmacy Department  []   [phone] )  [facility] []   [phone] )  [facility] [x]   [phone] )  [facility] []   [phone] )  [facility] []   [phone] )  [facility]

## 2022-12-30 ENCOUNTER — Telehealth: Payer: Self-pay | Admitting: *Deleted

## 2022-12-30 ENCOUNTER — Encounter: Payer: Self-pay | Admitting: *Deleted

## 2022-12-30 ENCOUNTER — Other Ambulatory Visit: Payer: Self-pay | Admitting: *Deleted

## 2022-12-30 DIAGNOSIS — K920 Hematemesis: Secondary | ICD-10-CM

## 2022-12-30 DIAGNOSIS — D509 Iron deficiency anemia, unspecified: Secondary | ICD-10-CM

## 2022-12-30 DIAGNOSIS — K7030 Alcoholic cirrhosis of liver without ascites: Secondary | ICD-10-CM

## 2022-12-30 DIAGNOSIS — K922 Gastrointestinal hemorrhage, unspecified: Secondary | ICD-10-CM | POA: Diagnosis not present

## 2022-12-30 DIAGNOSIS — R109 Unspecified abdominal pain: Secondary | ICD-10-CM

## 2022-12-30 DIAGNOSIS — I8510 Secondary esophageal varices without bleeding: Secondary | ICD-10-CM | POA: Diagnosis not present

## 2022-12-30 DIAGNOSIS — D62 Acute posthemorrhagic anemia: Secondary | ICD-10-CM

## 2022-12-30 LAB — CBC
HCT: 25.2 % — ABNORMAL LOW (ref 39.0–52.0)
Hemoglobin: 7.7 g/dL — ABNORMAL LOW (ref 13.0–17.0)
MCH: 24.2 pg — ABNORMAL LOW (ref 26.0–34.0)
MCHC: 30.6 g/dL (ref 30.0–36.0)
MCV: 79.2 fL — ABNORMAL LOW (ref 80.0–100.0)
Platelets: 122 10*3/uL — ABNORMAL LOW (ref 150–400)
RBC: 3.18 MIL/uL — ABNORMAL LOW (ref 4.22–5.81)
RDW: 20.3 % — ABNORMAL HIGH (ref 11.5–15.5)
WBC: 5.1 10*3/uL (ref 4.0–10.5)
nRBC: 0 % (ref 0.0–0.2)

## 2022-12-30 MED ORDER — PROPRANOLOL HCL 20 MG PO TABS
20.0000 mg | ORAL_TABLET | Freq: Two times a day (BID) | ORAL | Status: DC
  Administered 2022-12-30 – 2023-01-07 (×14): 20 mg via ORAL
  Filled 2022-12-30 (×16): qty 1

## 2022-12-30 NOTE — Progress Notes (Signed)
PROGRESS NOTE    Thomas [name]  ZOX:[id number] DOB: 31-Jul-1975 DOA: 12/25/2022 PCP: Thomas Sax, MD   Brief Narrative: Thomas [name] is a 47 y.o. male with a history of cirrhosis, varices, portal hypertension, obesity, OSA, psoriatic arthritis.  Patient presented secondary to Nausea, vomiting and hematemesis concerning for upper GI bleeding related to known varices. GI consulted. Patient transfused blood as needed. Right ankle fracture managed as an outpatient evaluated by orthopedic surgery this admission and performed ORIF on 7/21.   Assessment and Plan:  Hematemesis Acute GI bleeding Patient has a history of varices with concern for possible variceal bleeding. Patient is also prescribed ibuprofen but cannot tell me if he has been using it. Hematemesis has now appeared to have resolved. GI consulted and performed an upper endoscopy on 7/19 which was significant for 3 large columns of non-bleeding esophageal varices s/p 4 band ligations in addition to a large clot /altered body in the fundus of the stomach (s/p suction and irrigation) and portal hypertensive gastropathy. -GI recommendations: Protonix, Soft diet, Ceftriaxone x5 days  Acute blood loss anemia Secondary to acute blood loss from GI bleeding. Baseline hemoglobin of 11. Hemoglobin of 7.0 on admission requiring 1 unit of PRBC. Hemoglobin down to 6.6 today after initial unit of PRBC requiring another unit of PRBC. A third unit of PRBC transfused pre-op with post-transfusion hemoglobin up to 8.2. Hemoglobin down to 7.5 in setting of recent surgery. Possibly a component of post-operative anemia.  Hemoglobin is currently stable at 7.7. -CBC in AM  Right ankle fracture Diagnosed prior to admission. Required closed reduction in the ED with referral to orthopedic surgery for evaluation. Patient referred to Dr. Myrene [name]. Patient currently non weight bearing to right lower extremity. Orthopedic surgery consulted and  performed ORIF on 7/21. Discussed with orthopedic surgery and no indication for VTE prophylaxis from a post-op perspective. -Orthopedic surgery recommendations: Non-weight bearing RLE/keep elevated when in bed, follow-up with Dr. Dion [name] in 2 weeks -Continued physical therapy and Occupational Therapy -TOC consulted for consideration of SNF placement  Alcoholic cirrhosis Portal hypertension Noted. -Continue Propranolol and Protonix  Thrombocytopenia Worsened in setting of acute bleeding. Stable.  OSA Noted. CPAP held secondary to nausea/vomiting  Psoriasis Psoriatic arthritis Not on treatment.  Obesity Estimated body mass index is 47.43 kg/m as calculated from the following:   Height as of this encounter: 5\' 5"  (1.651 m).   Weight as of this encounter: 129.3 kg.  DVT prophylaxis: SCDs Code Status:   Code Status: Full Code Family Communication: None Disposition Plan: Discharge to SNF pending bed availability   Consultants:  [facility]  Procedures:  7/19: Upper GI endoscopy  Antimicrobials: None    Subjective: Patient reports doing very well.  Patient is concerned about discharge home because at home he has to navigate several thousands of steps to get throughout different spaces in his home.  His situation is complicated by him living with his father who is unable to care for him and he is concerned if he was to help they would both injure themselves.  Otherwise, patient has been improving regarding his pain.  He reports a bowel movement this morning which was mostly normal with a little bit of some black stool noted.  No other issues from overnight.  Objective: BP 127/80 (BP Location: Left Arm)   Pulse 81   Temp 98.1 F (36.7 C) (Oral)   Resp 12   Ht 5\' 5"  (1.651 m)   Wt 129.3 kg  SpO2 98%   BMI 47.43 kg/m   Examination:  General exam: Appears calm and comfortable Respiratory system: Clear to auscultation. Respiratory effort normal. Cardiovascular system:  S1 & S2 heard, RRR. Gastrointestinal system: Abdomen is nondistended, soft and nontender. Normal bowel sounds heard. Central nervous system: Alert and oriented. No focal neurological deficits. Musculoskeletal: Right leg in splint. Skin: No cyanosis. No rashes Psychiatry: Judgement and insight appear normal. Mood & affect appropriate.    Data Reviewed: I have personally reviewed following labs and imaging studies  CBC Lab Results  Component Value Date   WBC 5.1 12/30/2022   RBC 3.18 (L) 12/30/2022   HGB 7.7 (L) 12/30/2022   HCT 25.2 (L) 12/30/2022   MCV 79.2 (L) 12/30/2022   MCH 24.2 (L) 12/30/2022   PLT 122 (L) 12/30/2022   MCHC 30.6 12/30/2022   RDW 20.3 (H) 12/30/2022   LYMPHSABS 2.0 08/01/2021   MONOABS 1.1 (H) 08/01/2021   EOSABS 0.0 08/01/2021   BASOSABS 0.0 08/01/2021     Last metabolic panel Lab Results  Component Value Date   NA 135 12/29/2022   K 4.3 12/29/2022   CL 104 12/29/2022   CO2 25 12/29/2022   BUN 12 12/29/2022   CREATININE 0.99 12/29/2022   GLUCOSE 109 (H) 12/29/2022   GFRNONAA >60 12/29/2022   GFRAA >89 06/22/2013   CALCIUM 7.4 (L) 12/29/2022   PHOS 3.1 07/05/2022   PROT 4.8 (L) 12/26/2022   ALBUMIN 2.2 (L) 12/26/2022   BILITOT 2.6 (H) 12/26/2022   ALKPHOS 81 12/26/2022   AST 51 (H) 12/26/2022   ALT 31 12/26/2022   ANIONGAP 6 12/29/2022    GFR: Estimated Creatinine Clearance: 118.1 mL/min (by C-G formula based on SCr of 0.99 mg/dL).  Recent Results (from the past 240 hour(s))  Resp panel by RT-PCR (RSV, Flu A&B, Covid) Anterior Nasal Swab     Status: None   Collection Time: 12/25/22  4:30 PM   Specimen: Anterior Nasal Swab  Result Value Ref Range Status   SARS Coronavirus 2 by RT PCR NEGATIVE NEGATIVE Final   Influenza A by PCR NEGATIVE NEGATIVE Final   Influenza B by PCR NEGATIVE NEGATIVE Final    Comment: (NOTE) The Xpert Xpress SARS-CoV-2/FLU/RSV plus assay is intended as an aid in the diagnosis of influenza from Nasopharyngeal  swab specimens and should not be used as a sole basis for treatment. Nasal washings and aspirates are unacceptable for Xpert Xpress SARS-CoV-2/FLU/RSV testing.  Fact Sheet for Patients: BloggerCourse.com  Fact Sheet for Healthcare Providers: SeriousBroker.it  This test is not yet approved or cleared by the [place] FDA and has been authorized for detection and/or diagnosis of SARS-CoV-2 by FDA under an Emergency Use Authorization (EUA). This EUA will remain in effect (meaning this test can be used) for the duration of the COVID-19 declaration under Section 564(b)(1) of the Act, 21 U.S.C. section 360bbb-3(b)(1), unless the authorization is terminated or revoked.     Resp Syncytial Virus by PCR NEGATIVE NEGATIVE Final    Comment: (NOTE) Fact Sheet for Patients: BloggerCourse.com  Fact Sheet for Healthcare Providers: SeriousBroker.it  This test is not yet approved or cleared by the [place] FDA and has been authorized for detection and/or diagnosis of SARS-CoV-2 by FDA under an Emergency Use Authorization (EUA). This EUA will remain in effect (meaning this test can be used) for the duration of the COVID-19 declaration under Section 564(b)(1) of the Act, 21 U.S.C. section 360bbb-3(b)(1), unless the authorization is terminated or revoked.  Performed  at [facility], [address] [place], [place] [address]   Surgical pcr screen     Status: None   Collection Time: 12/27/22 11:03 PM   Specimen: Nasal Mucosa; Nasal Swab  Result Value Ref Range Status   MRSA, PCR NEGATIVE NEGATIVE Final   Staphylococcus aureus NEGATIVE NEGATIVE Final    Comment: (NOTE) The Xpert SA Assay (FDA approved for NASAL specimens in patients 20 years of age and older), is one component of a comprehensive surveillance program. It is not intended to diagnose infection nor to guide or  monitor treatment. Performed at [facility], [address], [place], [place] [address]       Radiology Studies: DG Ankle Right Port  Result Date: 12/28/2022 CLINICAL DATA:  Closed right ankle fracture. EXAM: PORTABLE RIGHT ANKLE - 2 VIEW COMPARISON:  Right ankle radiographs 12/22/2022 FINDINGS: There has been interval ORIF of the previously described fractures of the distal fibula and medial malleolus as well as syndesmosis repair. A lateral fixation plate and screws have been placed in the distal fibula, and a single screw traverses the medial malleolus fracture. Alignment is now anatomic. A splint is in place. IMPRESSION: Interval ORIF of distal tibia and fibula fractures with anatomic alignment. Electronically Signed   By: Sebastian Ache M.D.   On: 12/28/2022 12:54      LOS: 5 days    Jacquelin Hawking, MD [facility] 12/30/2022, 10:41 AM   If 7PM-7AM, please contact night-coverage [email or web address]

## 2022-12-30 NOTE — Progress Notes (Signed)
Mobility Specialist Progress Note:   12/30/22 1600  Mobility  Activity Ambulated with assistance to bathroom;Ambulated with assistance in room  Level of Assistance Minimal assist, patient does 75% or more  Assistive Device Front wheel walker (Bari)  Distance Ambulated (ft) 30 ft (15+15)  RLE Weight Bearing NWB  Activity Response Tolerated well  Mobility Referral Yes  $Mobility charge 1 Mobility  Mobility Specialist Start Time (ACUTE ONLY) 1600  Mobility Specialist Stop Time (ACUTE ONLY) 1617  Mobility Specialist Time Calculation (min) (ACUTE ONLY) 17 min    Pre Mobility: 79 HR During Mobility: 106 HR Post Mobility:  97 HR  Pt received in chair, requesting to be assisted to BR. Void successful. Asymptomatic throughout. Pt left in chair with call bell and chair alarm on.  D'Vante Earlene Plater Mobility Specialist Please contact via Special educational needs teacher or Rehab office at [phone]

## 2022-12-30 NOTE — Progress Notes (Addendum)
Subjective: 2 Days Post-Op s/p Procedure(s): OPEN REDUCTION INTERNAL FIXATION (ORIF) ANKLE FRACTURE   Patient is alert, oriented. Pain well controlled after increasing yesterday once block wore off. Worked well with PT yesterday, but feels better on Rolator than walker.  Denies chest pain, SOB, Calf pain. No nausea/vomiting. No other complaints.  He is worried about returning to 3 story home where he lives with his father. He would really like to go to a SNF for a few weeks if possible, states willing to self pay if insurance doesn't cover.     Objective:  PE: VITALS:   Vitals:   12/29/22 1616 12/29/22 2056 12/29/22 2305 12/30/22 0258  BP: (!) 110/54 132/60 128/61 (!) 114/57  Pulse: 75   71  Resp: 19 19 20 17   Temp: 98.1 F (36.7 C) 98.5 F (36.9 C) 97.8 F (36.6 C) 97.9 F (36.6 C)  TempSrc: Oral Oral Oral Oral  SpO2: 99% 95% 90% 90%  Weight:      Height:       General: laying in bed, in no acute distress MSK: RLE in splint. Splint CDI, rewrapped top ace wrap as patient complained it was uncomfortable. Able to flex and extend all toes. Cap refill intact. Sensation intact to all toes.   LABS  Results for orders placed or performed during the hospital encounter of 12/25/22 (from the past 24 hour(s))  Hemoglobin and hematocrit, blood     Status: Abnormal   Collection Time: 12/29/22  5:57 PM  Result Value Ref Range   Hemoglobin 7.8 (L) 13.0 - 17.0 g/dL   HCT 56.2 (L) 13.0 - 86.5 %  CBC     Status: Abnormal   Collection Time: 12/30/22  1:22 AM  Result Value Ref Range   WBC 5.1 4.0 - 10.5 K/uL   RBC 3.18 (L) 4.22 - 5.81 MIL/uL   Hemoglobin 7.7 (L) 13.0 - 17.0 g/dL   HCT 78.4 (L) 69.6 - 29.5 %   MCV 79.2 (L) 80.0 - 100.0 fL   MCH 24.2 (L) 26.0 - 34.0 pg   MCHC 30.6 30.0 - 36.0 g/dL   RDW 28.4 (H) 13.2 - 44.0 %   Platelets 122 (L) 150 - 400 K/uL   nRBC 0.0 0.0 - 0.2 %    DG Ankle Right Port  Result Date: 12/28/2022 CLINICAL DATA:  Closed right ankle fracture.  EXAM: PORTABLE RIGHT ANKLE - 2 VIEW COMPARISON:  Right ankle radiographs 12/22/2022 FINDINGS: There has been interval ORIF of the previously described fractures of the distal fibula and medial malleolus as well as syndesmosis repair. A lateral fixation plate and screws have been placed in the distal fibula, and a single screw traverses the medial malleolus fracture. Alignment is now anatomic. A splint is in place. IMPRESSION: Interval ORIF of distal tibia and fibula fractures with anatomic alignment. Electronically Signed   By: Sebastian Ache M.D.   On: 12/28/2022 12:54    Assessment/Plan: Right bimalleolar ankle fracture  2 Days Post-Op s/p Procedure(s): OPEN REDUCTION INTERNAL FIXATION (ORIF) ANKLE FRACTURE - Hbg 7.7 today  Weightbearing: NWB RLE, up with PT. Elevate RLE while in bed.  Insicional and dressing care: Reinforce dressings as needed VTE prophylaxis: Will defer DVT prophylaxis while inpatient to medicine/GI teams in setting of GI bleed.  Pain control: continue current regimen Follow - up plan: 2 weeks with Dr. Dion Saucier Dispo: progressing well with PT. Patient is worried about returning home to 3 story house even with hospital bed, HHPT,  and HH aide. He would like to pursue SNF placement. TOC consult placed.   Ok to discharge from ortho standpoint home with HHPT or SNF depending on what he qualifies for.   Contact information:   Janine Ores, Cordelia Poche [id number] [id number]  After hours and holidays please check [email or web address] for group call information for [facility]  Armida Sans 12/30/2022, 7:32 AM

## 2022-12-30 NOTE — Progress Notes (Signed)
Physical Therapy Treatment Patient Details Name: Thomas [name] MRN: [id number] DOB: [birth year] Today's Date: 12/30/2022   History of Present Illness Patient is a 47 y/o male admitted 12/25/22 due to upper GI bleed now s/p EGD on 7/19 with banding of esophageal varices.  Patient also with recent R ankle bimalleolar fx and underwent ORIF on 12/28/22.  PMH positive for obesity, sleep apnea, psoriatic arthritis, ETOH related cirrhosis, portal hypertensive gastropathy, esophageal and gastric varices.    PT Comments  Pt received sitting in the recliner and agreeable to session. Pt able to perform all mobility with up to min guard for safety. Pt using bari RW this session for increased stability. Pt able to perform short gait trial in the room, but was limited by BUE fatigue. Instructed pt in seated therex for RLE with pt able to demonstrate. Pt continues to benefit from PT services to progress toward functional mobility goals.      Assistance Recommended at Discharge Intermittent Supervision/Assistance  If plan is discharge home, recommend the following:  Can travel by private vehicle    A little help with walking and/or transfers;A little help with bathing/dressing/bathroom;Assistance with cooking/housework;Assist for transportation;Help with stairs or ramp for entrance      Equipment Recommendations  Rolling walker (2 wheels);Hospital bed;BSC/3in1    Recommendations for Other Services       Precautions / Restrictions Precautions Precautions: Fall Required Braces or Orthoses: Splint/Cast Splint/Cast: R foot/ankle Restrictions Weight Bearing Restrictions: Yes RLE Weight Bearing: Non weight bearing     Mobility  Bed Mobility               General bed mobility comments: Pt beginning and ending session in recliner    Transfers Overall transfer level: Needs assistance Equipment used: Rolling walker (2 wheels) Transfers: Sit to/from Stand Sit to Stand: Min guard            General transfer comment: Cues for hand placement, but no LOB or unsteadiness noted    Ambulation/Gait Ambulation/Gait assistance: Min guard Gait Distance (Feet): 30 Feet Assistive device: Rolling walker (2 wheels) Gait Pattern/deviations: Trunk flexed (hop-to)       General Gait Details: Pt able to perform a hop-to pattern with cues for RW proximity and min guard for safety. no unsteadiness noted      Balance Overall balance assessment: Needs assistance   Sitting balance-Leahy Scale: Good     Standing balance support: Bilateral upper extremity supported, During functional activity, Reliant on assistive device for balance Standing balance-Leahy Scale: Poor Standing balance comment: with RW for support due to NWB                            Cognition Arousal/Alertness: Awake/alert Behavior During Therapy: WFL for tasks assessed/performed Overall Cognitive Status: Within Functional Limits for tasks assessed                                          Exercises General Exercises - Lower Extremity Long Arc Quad: AROM, Seated, Right, 5 reps    General Comments        Pertinent Vitals/Pain Pain Assessment Pain Assessment: 0-10 Pain Score: 5  Pain Location: R foot/ankle Pain Descriptors / Indicators: Discomfort, Tingling Pain Intervention(s): Monitored during session, Repositioned     PT Goals (current goals can now be found in the care plan section)  Acute Rehab PT Goals Patient Stated Goal: to return home and mobilize safely PT Goal Formulation: With patient Time For Goal Achievement: 01/12/23 Potential to Achieve Goals: Good Progress towards PT goals: Progressing toward goals    Frequency    Min 1X/week      PT Plan Current plan remains appropriate       AM-PAC PT "6 Clicks" Mobility   Outcome Measure  Help needed turning from your back to your side while in a flat bed without using bedrails?: A Little Help needed moving  from lying on your back to sitting on the side of a flat bed without using bedrails?: A Little Help needed moving to and from a bed to a chair (including a wheelchair)?: A Little Help needed standing up from a chair using your arms (e.g., wheelchair or bedside chair)?: A Little Help needed to walk in hospital room?: A Little Help needed climbing 3-5 steps with a railing? : Total 6 Click Score: 16    End of Session Equipment Utilized During Treatment: Gait belt Activity Tolerance: Patient tolerated treatment well Patient left: in chair;with chair alarm set;with call bell/phone within reach Nurse Communication: Mobility status PT Visit Diagnosis: Other abnormalities of gait and mobility (R26.89);Difficulty in walking, not elsewhere classified (R26.2);Pain     Time: 2841-3244 PT Time Calculation (min) (ACUTE ONLY): 16 min  Charges:    $Gait Training: 8-22 mins PT General Charges $$ ACUTE PT VISIT: 1 Visit                     Johny Shock, PTA Acute Rehabilitation Services Secure Chat Preferred  [phone]    Johny Shock 12/30/2022, 10:28 AM

## 2022-12-30 NOTE — Op Note (Signed)
[facility] Patient Name: Thomas [name] Procedure Date : 12/26/2022 MRN: [id number] Attending MD: Napoleon Form , MD, [id number] Date of Birth: July 20, 1975 CSN: [id number] Age: 47 Admit Type: Inpatient Procedure:                Upper GI endoscopy Indications:              Active gastrointestinal bleeding, Suspected upper                            gastrointestinal bleeding, To evaluate esophageal                            varices in patient with suspected portal                            hypertension Providers:                Napoleon Form, MD, Melany Guernsey,                            Technician, Fransisca Connors RN Referring MD:              Medicines:                Monitored Anesthesia Care Complications:            No immediate complications. Estimated Blood Loss:     Estimated blood loss was minimal. Procedure:                Pre-Anesthesia Assessment:                           - Prior to the procedure, a History and Physical                            was performed, and patient medications and                            allergies were reviewed. The patient's tolerance of                            previous anesthesia was also reviewed. The risks                            and benefits of the procedure and the sedation                            options and risks were discussed with the patient.                            All questions were answered, and informed consent                            was obtained. Prior Anticoagulants: The patient has                            taken no anticoagulant  or antiplatelet agents. ASA                            Grade Assessment: III - A patient with severe                            systemic disease. After reviewing the risks and                            benefits, the patient was deemed in satisfactory                            condition to undergo the procedure.                           After obtaining  informed consent, the endoscope was                            passed under direct vision. Throughout the                            procedure, the patient's blood pressure, pulse, and                            oxygen saturations were monitored continuously. The                            [id number]-[id number] ([id number]) Olympus slim endoscope was                            introduced through the mouth, and advanced to the                            second part of duodenum. The upper GI endoscopy was                            accomplished without difficulty. The patient                            tolerated the procedure well. Scope In: Scope Out: Findings:      Three columns of large (> 5 mm) varices with stigmata of recent bleeding       were found in the middle third of the esophagus and in the lower third       of the esophagus, 33 to 40 cm from the incisors. They were 7 mm in       largest diameter. Red wale signs were present. Four bands were       successfully placed with complete eradication, resulting in deflation of       varices. There was no bleeding at the end of the procedure.      Large clot and altered blood in the fundus, cleared with suction and       irrigation, portal hypertensive gastropathy. No gastric fundic varices.      Antral erosion with no active bleeding, no obvious gastric lesions  concerning for gastric varices as noted on previous endoscopy      The examined duodenum was normal. Impression:               - Large (> 5 mm) esophageal varices with stigmata                            of recent bleeding. Completely eradicated. Banded.                           - Normal examined duodenum.                           - No specimens collected. Recommendation:           Clear liquid tonight                           Continue octreotide gtt.                           PPI twice daily                           Carafate suspension every 6 hours                           IV  ceftriaxone                           Monitor for alcohol withdrawal Procedure Code(s):        --- Professional ---                           [phone], Esophagogastroduodenoscopy, flexible,                            transoral; with band ligation of esophageal/gastric                            varices Diagnosis Code(s):        --- Professional ---                           I85.01, Esophageal varices with bleeding                           K92.2, Gastrointestinal hemorrhage, unspecified CPT copyright 2022 American Medical Association. All rights reserved. The codes documented in this report are preliminary and upon coder review may  be revised to meet current compliance requirements. Napoleon Form, MD 12/29/2022 3:06:49 PM This report has been signed electronically. Number of Addenda: 0

## 2022-12-30 NOTE — TOC Progression Note (Addendum)
Transition of Care [facility]) - Progression Note    Patient Details  Name: Thomas [name] MRN: [id number] Date of Birth: 1975-08-04  Transition of Care [facility]) CM/SW Contact  Huston Foley Jacklynn Ganong, RN Phone Number: 12/30/2022, 2:02 PM  Clinical Narrative:    Case Manager spoke with patient concerning discharge plans and recommendation for Home Health therapy. Patient has voiced his desire to go to SNF for shortterm rehab, CM explained that he does not qualify for insurance to cover, per therapy evaluation he can go home with therapy and equipment. Patient agreeable for CM to arrange for Home Health but wants to try for SNF even if he has to pay out of pocket. Case Manager explained that the Facilities may still deny him based on need. Suggested that he talk with his dad and family to arrange home to accommodate hospital bed.Patient voiced understanding. RW and commode will be delivered to patients room, Hospital bed will be delivered to his home.   [name] can not accept patient. CM will contact other agencies. Agencies are not accepting patient's insurance coverage. May need outpatient. Will update MD.   [facility] checking to see if they can accept patient as private pay.   Expected Discharge Plan: Home w Home Health Services Barriers to Discharge: Continued Medical Work up  Expected Discharge Plan and Services   Discharge Planning Services: CM Consult Post Acute Care Choice: Durable Medical Equipment, Home Health Living arrangements for the past 2 months: Single Family Home                 DME Arranged: 3-N-1, Walker wide, Hospital bed DME Agency: [employer] Date DME Agency Contacted: 12/30/22 Time DME Agency Contacted: 1300 Representative spoke with at DME Agency: Shaune Leeks HH Arranged: PT, OT, Nurse's Aide HH Agency: [facility] Date [facility] Agency Contacted: 12/30/22 Time HH Agency Contacted: 1243 Representative spoke with at [facility] Agency: Lorenza Chick   Social Determinants of Health (SDOH) Interventions SDOH Screenings   Food Insecurity: No Food Insecurity (12/29/2022)  Housing: Low Risk  (12/29/2022)  Transportation Needs: No Transportation Needs (12/29/2022)  Utilities: Not At Risk (12/29/2022)  Depression (PHQ2-9): Low Risk  (07/28/2022)  Tobacco Use: High Risk (12/28/2022)    Readmission Risk Interventions     No data to display

## 2022-12-30 NOTE — Telephone Encounter (Signed)
===  View-only below this line=== ----- Message ----- From: Napoleon Form, MD Sent: 12/28/2022   6:15 PM EDT To: Marlowe Kays, CMA; Evalee Jefferson, LPN  He needs repeat EGD at the hospital in 4 weeks. Thanks   Scheduled patient for EGD at [facility] on 9/5 at 11:15 am  Will put in Amb referral and call patient and mail instructions to him today   Spoke with patient and he is agreeable to 9/5 for his Endoscopy will call back with any questions

## 2022-12-31 DIAGNOSIS — K922 Gastrointestinal hemorrhage, unspecified: Secondary | ICD-10-CM | POA: Diagnosis not present

## 2022-12-31 MED ORDER — OXYCODONE HCL 5 MG PO TABS: 5.0000 mg | ORAL_TABLET | ORAL | 0 refills | Status: DC | PRN

## 2022-12-31 NOTE — Progress Notes (Signed)
PROGRESS NOTE    Thomas [name]  HQI:[id number] DOB: 1976-04-15 DOA: 12/25/2022 PCP: [name], MD   Brief Narrative: No notes on file   Assessment and Plan:  47 year old male with history of cirrhosis, esophageal varices, alcohol abuse, obesity, obstructive sleep apnea, psoriatic arthritis, anemia presents to the emergency department with complaints of nausea, vomiting, hematemesis.  Patient had a recent ER visit for right foot fracture reduced under sedation, since then has been experiencing nausea, vomiting.  GI is consulted, underwent EGD on 12/26/2022, found to have 3 large columns of nonbleeding esophageal varices s/p 4 band ligations in addition to a large clot/altered body in the fundus of the stomach, underwent suction, irrigation also found to have portal hypertensive gastropathy.  GI is recommended to continue with the Protonix, Rocephin for 5 days.  Upper GI bleed due to esophageal varices -S/p EGD s/p banding x 4 -Continue with IV Protonix -Continue with Rocephin for SBP prophylaxis -Started the patient on soft diet.  Acute blood loss anemia -Secondary to GI bleed -Patient has baseline hemoglobin of 11, admitted with hemoglobin 7, transfused 1 unit of PRBC -Hemoglobin has been trending down -Keep the patient on IV iron.  Recent right ankle fracture -S/p closed reduction -S/p ORIF on 12/28/2022 -Continue with nonweightbearing status to the right lower extremity -Continue with physical therapy, Occupational Therapy -Plan to discharge patient to SNF.  Alcoholic cirrhosis -Continue with the propranolol, Protonix.  Thrombocytopenia -Secondary to cirrhosis of the liver -Closely follow-up.  Obstructive sleep apnea -Continue with the CPAP if patient does not have any nausea, vomiting.  Psoriasis, psoriatic arthritis -Currently not on any treatment.  Morbid obesity -Patient has a BMI of 47.4 -Counseling done regarding diet and exercise - DVT  prophylaxis: SCDs Code Status:   Code Status: Full Code Family Communication: None Disposition Plan: Discharge to SNF pending bed availability   Consultants:  [facility]  Procedures:  7/19: Upper GI endoscopy  Antimicrobials: None    Subjective: Complaining of generalized weakness.  Preferred to go to SNF. Objective: BP 138/63 (BP Location: Left Arm)   Pulse 70   Temp 98.7 F (37.1 C) (Oral)   Resp 20   Ht 5\' 5"  (1.651 m)   Wt 129.3 kg   SpO2 99%   BMI 47.43 kg/m   Examination:  General exam: Appears calm and comfortable Respiratory system: Clear to auscultation. Respiratory effort normal. Cardiovascular system: S1 & S2 heard, RRR. Gastrointestinal system: Abdomen is nondistended, soft and nontender. Normal bowel sounds heard. Central nervous system: Alert and oriented. No focal neurological deficits. Musculoskeletal: Right leg in splint. Skin: No cyanosis. No rashes Psychiatry: Judgement and insight appear normal. Mood & affect appropriate.    Data Reviewed: I have personally reviewed following labs and imaging studies  CBC Lab Results  Component Value Date   WBC 5.1 12/30/2022   RBC 3.18 (L) 12/30/2022   HGB 7.7 (L) 12/30/2022   HCT 25.2 (L) 12/30/2022   MCV 79.2 (L) 12/30/2022   MCH 24.2 (L) 12/30/2022   PLT 122 (L) 12/30/2022   MCHC 30.6 12/30/2022   RDW 20.3 (H) 12/30/2022   LYMPHSABS 2.0 08/01/2021   MONOABS 1.1 (H) 08/01/2021   EOSABS 0.0 08/01/2021   BASOSABS 0.0 08/01/2021     Last metabolic panel Lab Results  Component Value Date   NA 135 12/29/2022   K 4.3 12/29/2022   CL 104 12/29/2022   CO2 25 12/29/2022   BUN 12 12/29/2022   CREATININE 0.99 12/29/2022  GLUCOSE 109 (H) 12/29/2022   GFRNONAA >60 12/29/2022   GFRAA >89 06/22/2013   CALCIUM 7.4 (L) 12/29/2022   PHOS 3.1 07/05/2022   PROT 4.8 (L) 12/26/2022   ALBUMIN 2.2 (L) 12/26/2022   BILITOT 2.6 (H) 12/26/2022   ALKPHOS 81 12/26/2022   AST 51 (H) 12/26/2022   ALT 31  12/26/2022   ANIONGAP 6 12/29/2022    GFR: Estimated Creatinine Clearance: 118.1 mL/min (by C-G formula based on SCr of 0.99 mg/dL).  Recent Results (from the past 240 hour(s))  Resp panel by RT-PCR (RSV, Flu A&B, Covid) Anterior Nasal Swab     Status: None   Collection Time: 12/25/22  4:30 PM   Specimen: Anterior Nasal Swab  Result Value Ref Range Status   SARS Coronavirus 2 by RT PCR NEGATIVE NEGATIVE Final   Influenza A by PCR NEGATIVE NEGATIVE Final   Influenza B by PCR NEGATIVE NEGATIVE Final    Comment: (NOTE) The Xpert Xpress SARS-CoV-2/FLU/RSV plus assay is intended as an aid in the diagnosis of influenza from Nasopharyngeal swab specimens and should not be used as a sole basis for treatment. Nasal washings and aspirates are unacceptable for Xpert Xpress SARS-CoV-2/FLU/RSV testing.  Fact Sheet for Patients: BloggerCourse.com  Fact Sheet for Healthcare Providers: SeriousBroker.it  This test is not yet approved or cleared by the [place] FDA and has been authorized for detection and/or diagnosis of SARS-CoV-2 by FDA under an Emergency Use Authorization (EUA). This EUA will remain in effect (meaning this test can be used) for the duration of the COVID-19 declaration under Section 564(b)(1) of the Act, 21 U.S.C. section 360bbb-3(b)(1), unless the authorization is terminated or revoked.     Resp Syncytial Virus by PCR NEGATIVE NEGATIVE Final    Comment: (NOTE) Fact Sheet for Patients: BloggerCourse.com  Fact Sheet for Healthcare Providers: SeriousBroker.it  This test is not yet approved or cleared by the [place] FDA and has been authorized for detection and/or diagnosis of SARS-CoV-2 by FDA under an Emergency Use Authorization (EUA). This EUA will remain in effect (meaning this test can be used) for the duration of the COVID-19 declaration under Section  564(b)(1) of the Act, 21 U.S.C. section 360bbb-3(b)(1), unless the authorization is terminated or revoked.  Performed at [facility], [address] [place], [place] [address]   Surgical pcr screen     Status: None   Collection Time: 12/27/22 11:03 PM   Specimen: Nasal Mucosa; Nasal Swab  Result Value Ref Range Status   MRSA, PCR NEGATIVE NEGATIVE Final   Staphylococcus aureus NEGATIVE NEGATIVE Final    Comment: (NOTE) The Xpert SA Assay (FDA approved for NASAL specimens in patients 88 years of age and older), is one component of a comprehensive surveillance program. It is not intended to diagnose infection nor to guide or monitor treatment. Performed at [facility], [address] [place], [place] [address]       Radiology Studies: No results found.    LOS: 6 days    Susa Griffins, MD [facility] 12/31/2022, 11:25 AM   If 7PM-7AM, please contact night-coverage [email or web address]

## 2022-12-31 NOTE — Progress Notes (Signed)
Mobility Specialist Progress Note:   12/31/22 1200  Mobility  Activity Ambulated with assistance in hallway  Level of Assistance Contact guard assist, steadying assist  Assistive Device Front wheel walker (Bari)  Distance Ambulated (ft) 50 ft  RLE Weight Bearing NWB  Activity Response Tolerated well  Mobility Referral Yes  $Mobility charge 1 Mobility  Mobility Specialist Start Time (ACUTE ONLY) 1148  Mobility Specialist Stop Time (ACUTE ONLY) 1204  Mobility Specialist Time Calculation (min) (ACUTE ONLY) 16 min    Pt received in chair, agreeable to mobility. Progressed to hallway ambulation w bariatric RW and CG. C/o fatigue and RLE pain, requiring x1 standing and x1 seated rest break. Asymptoamtic throughout. Pt left in chair w call bell and chair alarm on.  D'Vante Earlene Plater Mobility Specialist Please contact via Special educational needs teacher or Rehab office at [phone]

## 2022-12-31 NOTE — Progress Notes (Signed)
PT Cancellation Note  Patient Details Name: Thomas [name] MRN: [id number] DOB: December 23, 1975   Cancelled Treatment:    Reason Eval/Treat Not Completed: Fatigue/lethargy limiting ability to participate. PT attempted x 2. Pt on virtual work meeting during 1st attempt. On 2nd attempt, pt had just returned to room after amb with mobility tech and reporting fatigue.    Ilda Foil 12/31/2022, 12:27 PM

## 2022-12-31 NOTE — TOC Progression Note (Signed)
Transition of Care [facility]) - Progression Note    Patient Details  Name: Thomas [name] MRN: [id number] Date of Birth: [birth year]  Transition of Care [facility]) CM/SW Contact  Eduard Roux, [place] Phone Number: 12/31/2022, 10:04 AM  Clinical Narrative:     CSW met with patient at bedside. CSW explained cost and reasons why SNF is not  appropriate discharge plan. Patient states understanding and is agreeable w/ HH plan. CSW encourage patient to continue to work with PT/OT.   CSW updated CM.   Antony Blackbird, MSW, LCSW Clinical Social Worker    Expected Discharge Plan: Home w Home Health Services Barriers to Discharge: Continued Medical Work up  Expected Discharge Plan and Services   Discharge Planning Services: CM Consult Post Acute Care Choice: Durable Medical Equipment, Home Health Living arrangements for the past 2 months: Single Family Home                 DME Arranged: 3-N-1, Walker wide, Hospital bed DME Agency: [employer] Date DME Agency Contacted: 12/30/22 Time DME Agency Contacted: 1300 Representative spoke with at DME Agency: [facility]ks HH Arranged: PT, OT, Nurse's Aide HH Agency: [facility] Date [facility] Agency Contacted: 12/30/22 Time HH Agency Contacted: 1243 Representative spoke with at [facility] Agency: Lorenza Chick   Social Determinants of Health (SDOH) Interventions SDOH Screenings   Food Insecurity: No Food Insecurity (12/29/2022)  Housing: Low Risk  (12/29/2022)  Transportation Needs: No Transportation Needs (12/29/2022)  Utilities: Not At Risk (12/29/2022)  Depression (PHQ2-9): Low Risk  (07/28/2022)  Tobacco Use: High Risk (12/28/2022)    Readmission Risk Interventions     No data to display

## 2023-01-01 ENCOUNTER — Encounter (HOSPITAL_COMMUNITY): Payer: Self-pay | Admitting: Orthopedic Surgery

## 2023-01-01 DIAGNOSIS — K922 Gastrointestinal hemorrhage, unspecified: Secondary | ICD-10-CM | POA: Diagnosis not present

## 2023-01-01 NOTE — NC FL2 (Signed)
[place] MEDICAID FL2 LEVEL OF CARE FORM     IDENTIFICATION  Patient Name: Thomas [name] Birthdate: [birth year] Sex: male Admission Date (Current Location): 12/25/2022  [facility] and [place] Number:  Producer, television/film/video and Address:  [facility], [address] [place], [place] [address]      Provider Number: [id number]  Attending Physician Name and Address:  Susa Griffins, MD  Relative Name and Phone Number:  Whitman, Meinhardt (Father)  [phone]    Current Level of Care: Hospital Recommended Level of Care: Skilled Nursing Facility Prior Approval Number:    Date Approved/Denied:   PASRR Number:    Discharge Plan: SNF    Current Diagnoses: Patient Active Problem List   Diagnosis Date Noted   Secondary esophageal varices with bleeding (HCC) 12/26/2022   Cirrhosis of liver (HCC) 12/25/2022   Acute GI bleeding 12/25/2022   Reactive airway disease 07/28/2022   Acute upper GI bleed 07/05/2022   Hematemesis with nausea 07/05/2022   Melena 07/05/2022   Pharyngitis 10/22/2021   Psoriatic arthritis (HCC) 10/03/2021   Viral upper respiratory tract infection 10/03/2021   Normocytic anemia 08/26/2021   Screening for tuberculosis 08/15/2021   Elevated BP without diagnosis of hypertension 06/14/2021   Esophageal varices in alcoholic cirrhosis (HCC) 09/26/2019   Class 3 severe obesity due to excess calories with body mass index (BMI) of 50.0 to 59.9 in adult [facility]) 09/26/2019   Healthcare maintenance 02/07/2019   Psoriasis 02/07/2019   Vitamin D deficiency 05/07/2016   OSA (obstructive sleep apnea) 06/23/2013   Insomnia 06/16/2013   Alcoholism (HCC) 12/25/2011   Scrotal varices, 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   Alcoholic fatty liver 01/16/2010   Allergic rhinitis 10/12/2007    Orientation RESPIRATION BLADDER Height & Weight     Self, Time, Situation, Place  Normal Continent Weight: 285 lb (129.3 kg) Height:  5\' 5"  (165.1 cm)  BEHAVIORAL SYMPTOMS/MOOD  NEUROLOGICAL BOWEL NUTRITION STATUS      Continent Diet (see discharge summary)  AMBULATORY STATUS COMMUNICATION OF NEEDS Skin   Extensive Assist Verbally Surgical wounds (incision right leg;)                       Personal Care Assistance Level of Assistance  Bathing, Feeding, Dressing Bathing Assistance: Limited assistance Feeding assistance: Independent Dressing Assistance: Limited assistance     Functional Limitations Info  Sight, Hearing, Speech Sight Info: Adequate Hearing Info: Adequate Speech Info: Adequate    SPECIAL CARE FACTORS FREQUENCY  PT (By licensed PT), OT (By licensed OT)     PT Frequency: 5x/ week OT Frequency: 5x/ week            Contractures Contractures Info: Not present    Additional Factors Info  Code Status, Allergies Code Status Info: FULL Allergies Info: Codeine           Current Medications (01/01/2023):  This is the current hospital active medication list Current Facility-Administered Medications  Medication Dose Route Frequency Provider Last Rate Last Admin   0.9 %  sodium chloride infusion (Manually program via Guardrails IV Fluids)   Intravenous Once Manson Passey, Blaine K, PA-C       0.9 %  sodium chloride infusion (Manually program via Guardrails IV Fluids)   Intravenous Once Manson Passey, Blaine K, PA-C       0.9 %  sodium chloride infusion (Manually program via Guardrails IV Fluids)   Intravenous Once [employer] K, PA-C       0.9 %  sodium chloride infusion (Manually program via Guardrails IV Fluids)   Intravenous Once Narda Bonds, MD       acetaminophen (TYLENOL) tablet 650 mg  650 mg Oral Q4H PRN Janine Ores K, PA-C       Or   acetaminophen (TYLENOL) suppository 650 mg  650 mg Rectal Q6H PRN Janine Ores K, PA-C       alum & mag hydroxide-simeth (MAALOX/MYLANTA) 200-200-20 MG/5ML suspension 30 mL  30 mL Oral Q4H PRN Narda Bonds, MD   30 mL at 12/31/22 1840   HYDROmorphone (DILAUDID) injection 0.5 mg  0.5 mg Intravenous Q4H  PRN Janine Ores K, PA-C       ondansetron [facility]) injection 4 mg  4 mg Intravenous Q6H PRN Janine Ores K, PA-C       oxyCODONE (Oxy IR/ROXICODONE) immediate release tablet 5-10 mg  5-10 mg Oral Q4H PRN Janine Ores K, PA-C   10 mg at 01/01/23 1405   pantoprazole (PROTONIX) EC tablet 40 mg  40 mg Oral BID Pham, Minh Q, RPH-CPP   40 mg at 01/01/23 0735   propranolol (INDERAL) tablet 20 mg  20 mg Oral BID Narda Bonds, MD   20 mg at 01/01/23 0735   sodium chloride flush (NS) 0.9 % injection 3 mL  3 mL Intravenous Q12H Brown, Blaine K, PA-C   3 mL at 01/01/23 6578     Discharge Medications: Please see discharge summary for a list of discharge medications.  Relevant Imaging Results:  Relevant Lab Results:   Additional Information SSN: [id number]  Catalina Pizza , LCSW

## 2023-01-01 NOTE — Progress Notes (Signed)
Pt arrived to unit. He is AAOX4. Vital signs are stable. Bed in lowest position and call bell is within reach. No s/s of any distress.

## 2023-01-01 NOTE — Progress Notes (Addendum)
Physical Therapy Treatment Patient Details Name: Thomas [name] MRN: [id number] DOB: 07-06-1975 Today's Date: 01/01/2023   History of Present Illness Patient is a 47 y/o male admitted 12/25/22 due to upper GI bleed now s/p EGD on 7/19 with banding of esophageal varices.  Patient also with recent R ankle bimalleolar fx and underwent ORIF on 12/28/22.  PMH positive for obesity, sleep apnea, psoriatic arthritis, ETOH related cirrhosis, portal hypertensive gastropathy, esophageal and gastric varices.    PT Comments  Pt presents well below baseline level of functioning. Pt is young 47 yo male who was independent with all mobility prior to hospitalization. Currently pt is total assist to navigate stairs and requires Min A for short distance gait that is not the distance of one room in a home. Pt required 1x assist to prevent fall with gait. Due to pt current functional status, home set up and available assistance at home as well as pt age and rehab ability pt would benefit from higher level frequency of skilled physical therapy services 5-6x/weekly to work on Mod I Transfers, gait and CGA/Set up with stairs in order to return home with a decreased risk of falls, injury, rehospitalization and to protect the integrity of operative site.  Pt demonstrates the ability to progress quickly with mobility and will continue to work while in acute care setting on safe transfers, gait and stairs.    Assistance Recommended at Discharge Intermittent Supervision/Assistance  If plan is discharge home, recommend the following:  Can travel by private vehicle    A little help with walking and/or transfers;Assistance with cooking/housework;Assist for transportation;Help with stairs or ramp for entrance      Equipment Recommendations  Rolling walker (2 wheels);Hospital bed;BSC/3in1    Recommendations for Other Services Rehab consult     Precautions / Restrictions Precautions Precautions: Fall Required Braces or  Orthoses: Splint/Cast Splint/Cast: R foot/ankle Restrictions Weight Bearing Restrictions: Yes RLE Weight Bearing: Non weight bearing     Mobility  Bed Mobility               General bed mobility comments: Pt beginning and ending session in recliner    Transfers Overall transfer level: Needs assistance Equipment used: Rolling walker (2 wheels) Transfers: Sit to/from Stand Sit to Stand: Min guard           General transfer comment: no LOB or unsteadiness noted, requires rocking fwd/back to increase momentum    Ambulation/Gait Ambulation/Gait assistance: Min assist Gait Distance (Feet): 15 Feet Assistive device: Rolling walker (2 wheels) Gait Pattern/deviations: Step-to pattern Gait velocity: Decreased velocity Gait velocity interpretation: <1.31 ft/sec, indicative of household ambulator   General Gait Details: Pt performs step to/Hop to pattern with RW. Pt requires Min A to maintain balance with turns during short distance gait. Multi directional sway   Stairs Stairs: Yes Stairs assistance: Total assist Stair Management: Backwards, With walker Number of Stairs: 0 General stair comments: Pt was unable to clear one step and was unable to sit on step to perform stairs per home set up with Max A from this physical therapist.        Balance Overall balance assessment: Needs assistance   Sitting balance-Leahy Scale: Good     Standing balance support: Bilateral upper extremity supported, During functional activity, Reliant on assistive device for balance Standing balance-Leahy Scale: Poor Standing balance comment: with RW for support due to NWB with multi directional sway and 1x LOB requiring intervention        Cognition Arousal/Alertness: Awake/alert  Behavior During Therapy: WFL for tasks assessed/performed Overall Cognitive Status: Within Functional Limits for tasks assessed          General Comments General comments (skin integrity, edema, etc.):  Pt has elderly father to assist at home.      Pertinent Vitals/Pain Pain Assessment Pain Assessment: No/denies pain Pain Intervention(s): Monitored during session     PT Goals (current goals can now be found in the care plan section) Acute Rehab PT Goals Patient Stated Goal: to return home and mobilize safely PT Goal Formulation: With patient Time For Goal Achievement: 01/12/23 Potential to Achieve Goals: Good Progress towards PT goals: Progressing toward goals    Frequency    Min 3X/week      PT Plan Discharge plan needs to be updated;Frequency needs to be updated       AM-PAC PT "6 Clicks" Mobility   Outcome Measure  Help needed turning from your back to your side while in a flat bed without using bedrails?: A Little Help needed moving from lying on your back to sitting on the side of a flat bed without using bedrails?: A Little Help needed moving to and from a bed to a chair (including a wheelchair)?: A Little Help needed standing up from a chair using your arms (e.g., wheelchair or bedside chair)?: A Little Help needed to walk in hospital room?: A Lot Help needed climbing 3-5 steps with a railing? : Total 6 Click Score: 15    End of Session Equipment Utilized During Treatment: Gait belt Activity Tolerance: Patient tolerated treatment well Patient left: in chair;with call bell/phone within reach;with family/visitor present Nurse Communication: Mobility status PT Visit Diagnosis: Other abnormalities of gait and mobility (R26.89);Difficulty in walking, not elsewhere classified (R26.2);Pain Pain - Right/Left: Right Pain - part of body: Ankle and joints of foot     Time: 7425-9563 PT Time Calculation (min) (ACUTE ONLY): 33 min  Charges:    $Gait Training: 8-22 mins $Therapeutic Activity: 8-22 mins PT General Charges $$ ACUTE PT VISIT: 1 Visit                     Harrel Carina, DPT, CLT  Acute Rehabilitation Services Office: [phone] (Secure chat  preferred)    Claudia Desanctis 01/01/2023, 2:04 PM

## 2023-01-01 NOTE — Progress Notes (Signed)
? ?  Inpatient Rehab Admissions Coordinator : ? ?Per therapy recommendations, patient was screened for CIR candidacy by Barbara Boyette RN MSN.  At this time patient appears to be a potential candidate for CIR. I will place a rehab consult per protocol for full assessment. Please call me with any questions. ? ?Barbara Boyette RN MSN ?Admissions Coordinator ?[phone] ?  ?

## 2023-01-01 NOTE — TOC Progression Note (Addendum)
Transition of Care (TOC) - Progression Note   Spoke to patient at bedside. [name] cannot accept referral due to insurance.   Kelly with [facility] cannot accept for HHPT,OT and aide.   Patient's walker and 3 in 1 for home are at bedside. [facility] will call for bed delivery . MD aware not likely today   Patient states he received information from his insurance company [name] [phone] ext [phone] that he can go to SNF at discharge.   NCM explained PT recommending HHPT and TOC has not submitted for insurance approval for SNF. Patient insists insurance company told him they will cover SNF  NCM called [name] [phone] ext [phone] and left a message and secure chatted SW.  SW spoke with patient.   [name] called NCM back. Per her she told patient he has the benfit for SNF but insurance would need to review clinicals, PT notes etc   Spoke with patient again. Patient agreeable to discharge to home with home health. He is requesting [facility] transportation home. Explained [facility] will not transport walker and 3 in1. NCM will call [employer] and see if they can deliver with hospital bed. Patient stated his father is coming to the hospital to bring him clothes. His father will take walker and 3 in1 home in car with him. NCM explained once discharge complete will call [facility] , arrival time depends on how busy they are. Patient voiced understanding and stated his father will wait here at hospital until [facility] comes. Someone needs to be at the home to receive the bed   NCM called [employer] regarding  hospital bed . Order was entered yesterday but not signed by attending. Patient's attending changed today. NCM entered order and secure chatted MD to sign. [employer] cannot start processing order until signed.   Confirmed address  [id number] Order for hospital bed signed called [employer]    Patient Details  Name: Thomas [name] MRN: [id number] Date of Birth: [birth year]  Transition of Care [facility])  CM/SW Contact  , Adria Devon, RN Phone Number: 01/01/2023, 10:15 AM  Clinical Narrative:       Expected Discharge Plan: Home w Home Health Services Barriers to Discharge: Continued Medical Work up  Expected Discharge Plan and Services   Discharge Planning Services: CM Consult Post Acute Care Choice: Durable Medical Equipment, Home Health Living arrangements for the past 2 months: Single Family Home                 DME Arranged: 3-N-1, Walker wide, Hospital bed DME Agency: [employer] Date DME Agency Contacted: 12/30/22 Time DME Agency Contacted: 1300 Representative spoke with at DME Agency: Shaune Leeks HH Arranged: PT, OT, Nurse's Aide HH Agency: [facility] Date [facility] Agency Contacted: 12/30/22 Time HH Agency Contacted: 1243 Representative spoke with at [facility] Agency: Lorenza Chick   Social Determinants of Health (SDOH) Interventions SDOH Screenings   Food Insecurity: No Food Insecurity (12/29/2022)  Housing: Low Risk  (12/29/2022)  Transportation Needs: No Transportation Needs (12/29/2022)  Utilities: Not At Risk (12/29/2022)  Depression (PHQ2-9): Low Risk  (07/28/2022)  Tobacco Use: High Risk (12/28/2022)    Readmission Risk Interventions     No data to display

## 2023-01-01 NOTE — TOC Progression Note (Addendum)
Transition of Care [facility]) - Initial/Assessment Note    Patient Details  Name: Thomas [name] MRN: [id number] Date of Birth: 05-02-1976  Transition of Care [facility]) CM/SW Contact:    Ralene Bathe, LCSW Phone Number: 01/01/2023, 11:59 AM  Clinical Narrative:                 LCSW met with patient at bedside and had an in depth discussion in reference to requirements for SNF placement, the cost of the stay, and next steps.  The patient is now agreeable to returning home with home health services.  The patient reports living in a 3 level home with his elderly father who cannot provide much assistance with mobility, but can assist with meals and other small task.  The patient described the layout of the home and reports that he can stay on the main level, which has access to a bathroom ect., until his mobility improves.   Addendum 13:45- CSW notified by PT that the rehab recommendation would be changed to [facility].  CSW spoke with patient and informed patient of recommendation.  The patient is agreeable to SNF should it be decided that the patient is not be a candidate for [facility]. Patient reports preference for [facility] . CSW discussed insurance authorization process and provided patient with the website to access the Medicare SNF ratings list. CSW will send out referrals for review and provide bed offers as available should [facility] be unable to accept.    TOC following.  Expected Discharge Plan: Home w Home Health Services Barriers to Discharge: Continued Medical Work up   Patient Goals and CMS Choice     Choice offered to / list presented to : Patient      Expected Discharge Plan and Services   Discharge Planning Services: CM Consult Post Acute Care Choice: Durable Medical Equipment, Home Health Living arrangements for the past 2 months: Single Family Home                 DME Arranged: 3-N-1, Walker wide, Hospital bed DME Agency: [employer] Date DME Agency Contacted: 12/30/22 Time  DME Agency Contacted: 1300 Representative spoke with at DME Agency: Shaune Leeks HH Arranged: PT, OT, Nurse's Aide HH Agency: [facility] Date [facility] Agency Contacted: 12/30/22 Time HH Agency Contacted: 1243 Representative spoke with at [facility] Agency: Lorenza Chick  Prior Living Arrangements/Services Living arrangements for the past 2 months: Single Family Home Lives with:: Relatives (patient's dad lives with him)   Do you feel safe going back to the place where you live?: Yes      Need for Family Participation in Patient Care: Yes (Comment)     Criminal Activity/Legal Involvement Pertinent to Current Situation/Hospitalization: No - Comment as needed  Activities of Daily Living Home Assistive Devices/Equipment: None ADL Screening (condition at time of admission) Patient's cognitive ability adequate to safely complete daily activities?: Yes Is the patient deaf or have difficulty hearing?: No Does the patient have difficulty seeing, even when wearing glasses/contacts?: No Does the patient have difficulty concentrating, remembering, or making decisions?: No Patient able to express need for assistance with ADLs?: Yes Does the patient have difficulty dressing or bathing?: No Independently performs ADLs?: Yes (appropriate for developmental age) Does the patient have difficulty walking or climbing stairs?: Yes (right ankle fracture) Weakness of Legs: Right (right ankle fracture) Weakness of Arms/Hands: None  Permission Sought/Granted  Emotional Assessment   Attitude/Demeanor/Rapport: Gracious   Orientation: : Oriented to Self, Oriented to Place, Oriented to  Time, Oriented to Situation Alcohol / Substance Use: Not Applicable Psych Involvement: No (comment)  Admission diagnosis:  Acute GI bleeding [K92.2] Hematemesis with nausea [K92.0] Patient Active Problem List   Diagnosis Date Noted   Secondary esophageal varices with bleeding (HCC) 12/26/2022    Cirrhosis of liver (HCC) 12/25/2022   Acute GI bleeding 12/25/2022   Reactive airway disease 07/28/2022   Acute upper GI bleed 07/05/2022   Hematemesis with nausea 07/05/2022   Melena 07/05/2022   Pharyngitis 10/22/2021   Psoriatic arthritis (HCC) 10/03/2021   Viral upper respiratory tract infection 10/03/2021   Normocytic anemia 08/26/2021   Screening for tuberculosis 08/15/2021   Elevated BP without diagnosis of hypertension 06/14/2021   Esophageal varices in alcoholic cirrhosis (HCC) 09/26/2019   Class 3 severe obesity due to excess calories with body mass index (BMI) of 50.0 to 59.9 in adult [facility]) 09/26/2019   Healthcare maintenance 02/07/2019   Psoriasis 02/07/2019   Vitamin D deficiency 05/07/2016   OSA (obstructive sleep apnea) 06/23/2013   Insomnia 06/16/2013   Alcoholism (HCC) 12/25/2011   Scrotal varices, 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   Alcoholic fatty liver 01/16/2010   Allergic rhinitis 10/12/2007   PCP:  [name], MD Pharmacy:   [facility] [phone] - [place], [place] - [address] [place] [address] Phone: [phone] Fax: [phone]     Social Determinants of Health (SDOH) Social History: SDOH Screenings   Food Insecurity: No Food Insecurity (12/29/2022)  Housing: Low Risk  (12/29/2022)  Transportation Needs: No Transportation Needs (12/29/2022)  Utilities: Not At Risk (12/29/2022)  Depression (PHQ2-9): Low Risk  (07/28/2022)  Tobacco Use: High Risk (12/28/2022)   SDOH Interventions:     Readmission Risk Interventions     No data to display

## 2023-01-02 DIAGNOSIS — K922 Gastrointestinal hemorrhage, unspecified: Secondary | ICD-10-CM | POA: Diagnosis not present

## 2023-01-02 NOTE — Evaluation (Signed)
Occupational Therapy Evaluation Patient Details Name: Thomas [name] MRN: [id number] DOB: 22-Nov-1975 Today's Date: 01/02/2023   History of Present Illness Patient is a 47 y/o male admitted 12/25/22 due to upper GI bleed now s/p EGD on 7/19 with banding of esophageal varices.  Patient also with recent R ankle bimalleolar fx and underwent ORIF on 12/28/22.  PMH positive for obesity, sleep apnea, psoriatic arthritis, ETOH related cirrhosis, portal hypertensive gastropathy, esophageal and gastric varices.   Clinical Impression   Pt completes simulated selfcare tasks and toilet transfers at min assist to min guard overall.  He is able to adhere to NWBing status in the RLE with tasks but does exhibit limited endurance for simulated meal prep and home management tasks using the RW.  He lives with his father but he cannot provide safe physical assist.  Feel pt will benefit from acute care OT in order to increase ADL independence for return home.  Feel Patient will benefit from intensive inpatient follow up therapy, >3 hours/day post acute rehab.        Recommendations for follow up therapy are one component of a multi-disciplinary discharge planning process, led by the attending physician.  Recommendations may be updated based on patient status, additional functional criteria and insurance authorization.   Assistance Recommended at Discharge PRN  Patient can return home with the following A little help with bathing/dressing/bathroom;Assist for transportation;Help with stairs or ramp for entrance    Functional Status Assessment  Patient has had a recent decline in their functional status and demonstrates the ability to make significant improvements in function in a reasonable and predictable amount of time.  Equipment Recommendations  Tub/shower bench;Wheelchair (measurements OT) (will continue to asses in treatment to determine if he would benefit from shower bench initially)    Recommendations for  Other Services Rehab consult     Precautions / Restrictions Precautions Precautions: Fall Required Braces or Orthoses: Splint/Cast Splint/Cast: R foot/ankle Restrictions Weight Bearing Restrictions: Yes RLE Weight Bearing: Non weight bearing      Mobility Bed Mobility                    Transfers Overall transfer level: Needs assistance Equipment used: Rolling walker (2 wheels) Transfers: Sit to/from Stand, Bed to chair/wheelchair/BSC Sit to Stand: Min guard     Step pivot transfers: Min guard     General transfer comment: Pt able to adhere to weightbearing restrictions throughout sit to stand and stand step transfers.      Balance Overall balance assessment: Needs assistance   Sitting balance-Leahy Scale: Good     Standing balance support: Bilateral upper extremity supported, During functional activity, Reliant on assistive device for balance Standing balance-Leahy Scale: Poor Standing balance comment: Pt needs UE support to follow weightbearing restrictions.                           ADL either performed or assessed with clinical judgement   ADL Overall ADL's : Needs assistance/impaired Eating/Feeding: Independent;Sitting Eating/Feeding Details (indicate cue type and reason): simulated Grooming: Min guard;Standing Grooming Details (indicate cue type and reason): simulated Upper Body Bathing: Set up;Sitting   Lower Body Bathing: Minimal assistance;Sit to/from stand Lower Body Bathing Details (indicate cue type and reason): slight difficulty reaching his left foot sitting in the recliner Upper Body Dressing : Set up;Sitting   Lower Body Dressing: Minimal assistance;Sit to/from stand   Toilet Transfer: Min Geographical information systems officer (2 wheels) Statistician Details (  indicate cue type and reason): simulated, pt declined need to toilet Toileting- Clothing Manipulation and Hygiene: Min guard;Sit to/from stand       Functional mobility  during ADLs: Min guard;Rolling walker (2 wheels) General ADL Comments: Pt has multiple steps at home to access basement where he stays mostly as well as upper floor.  Pt plans to stay on the first level where living room and kitchen are available.  Feel he will benefit from wheelchair for simple meal prep activities as he fatigues quickly with mobility with dyspnea 3/4 after hopping approximately 25'.  Will need BSC which has already been delivered to his room.  discussed need for tub bench/seat but will depend on where he wants to stay.  Shower is not available on the first floor, only basement and 2nd floor.     Vision Baseline Vision/History: 0 No visual deficits Ability to See in Adequate Light: 0 Adequate Patient Visual Report: No change from baseline Vision Assessment?: No apparent visual deficits     Perception  WFL   Praxis  [facility]    Pertinent Vitals/Pain Pain Assessment Pain Score: 4  Pain Location: right ankle Pain Descriptors / Indicators: Discomfort Pain Intervention(s): Monitored during session, Repositioned     Hand Dominance Right   Extremity/Trunk Assessment Upper Extremity Assessment Upper Extremity Assessment: Overall WFL for tasks assessed   Lower Extremity Assessment Lower Extremity Assessment: Defer to PT evaluation   Cervical / Trunk Assessment Cervical / Trunk Assessment: Normal   Communication Communication Communication: No difficulties   Cognition Arousal/Alertness: Awake/alert Behavior During Therapy: WFL for tasks assessed/performed Overall Cognitive Status: Within Functional Limits for tasks assessed                                                  Home Living Family/patient expects to be discharged to:: Private residence Living Arrangements: Parent (dad lives with him) Available Help at Discharge: Family Type of Home: House Home Access: Stairs to enter [employer] of Steps: can enter from garage 3 steps no  rail, main entrance 3 brick steps with porch, has metal rail on outside of platform with wooden post holding up awning   Home Layout: Multi-level Alternate Level Stairs-Number of Steps: half bath on first floor no shower, was sleeping in recliner; shower is up 9-10 steps then landing then 6-7 steps to bathrooms   Bathroom Shower/Tub: Tub/shower unit;Walk-in shower Conservation officer, historic buildings is walk in shower, tub shower is in hallway)   Firefighter: Standard     Home Equipment: Crutches;Wheelchair - manual   Additional Comments: tub shower in the basement      Prior Functioning/Environment Prior Level of Function : Independent/Modified Independent             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 OT Goal  Formulation: With patient Time For Goal Achievement: 01/16/23 Potential to Achieve Goals: Good  OT Frequency: Min 1X/week       AM-PAC OT "6 Clicks" Daily Activity     Outcome Measure Help from another person eating meals?: None Help from another person taking care of personal grooming?: A Little Help from another person toileting, which includes using toliet, bedpan, or urinal?: A Little Help from another person bathing (including washing, rinsing, drying)?: A Little Help from another person to put on and taking off regular upper body clothing?: A Little Help from another person to  put on and taking off regular lower body clothing?: A Little 6 Click Score: 19   End of Session Equipment Utilized During Treatment: Gait belt;Rolling walker (2 wheels)  Activity Tolerance: Patient limited by fatigue Patient left: with call bell/phone within reach;in chair  OT Visit Diagnosis: Unsteadiness on feet (R26.81);Muscle weakness (generalized) (M62.81);Pain Pain - Right/Left: Right Pain - part of body: Ankle and joints of foot                Time: 4098-1191 OT Time Calculation (min): 39 min Charges:  OT General Charges $OT Visit: 1 Visit OT Evaluation $OT Eval Moderate Complexity: 1 Mod OT Treatments $Self Care/Home Management : 23-37 mins  Perrin Maltese, OTR/L Acute Rehabilitation Services  Office [phone] 01/02/2023

## 2023-01-02 NOTE — Progress Notes (Signed)
PROGRESS NOTE    Thomas [name]  FIE:[id number] DOB: [birth year] DOA: 12/25/2022 PCP: [name], MD   Brief Narrative: No notes on file   Assessment and Plan:  47 year old male with history of cirrhosis, esophageal varices, alcohol abuse, obesity, obstructive sleep apnea, psoriatic arthritis, anemia presents to the emergency department with complaints of nausea, vomiting, hematemesis.  Patient had a recent ER visit for right foot fracture reduced under sedation, since then has been experiencing nausea, vomiting.  GI is consulted, underwent EGD on 12/26/2022, found to have 3 large columns of nonbleeding esophageal varices s/p 4 band ligations in addition to a large clot/altered body in the fundus of the stomach, underwent suction, irrigation also found to have portal hypertensive gastropathy.  GI is recommended to continue with the Protonix, Rocephin for 5 days.  Upper GI bleed due to esophageal varices -S/p EGD s/p banding x 4 -Continue with IV Protonix -Continue with Rocephin for SBP prophylaxis -Started the patient on soft diet.  Acute blood loss anemia -Secondary to GI bleed -Patient has baseline hemoglobin of 11, admitted with hemoglobin 7, transfused 1 unit of PRBC -Hemoglobin has been trending down -Keep the patient on IV iron.  Recent right ankle fracture -S/p closed reduction -S/p ORIF on 12/28/2022 -Continue with nonweightbearing status to the right lower extremity -Continue with physical therapy, Occupational Therapy -Plan to discharge patient to SNF.  Alcoholic cirrhosis -Continue with the propranolol, Protonix.  Thrombocytopenia -Secondary to cirrhosis of the liver -Closely follow-up.  Obstructive sleep apnea -Continue with the CPAP if patient does not have any nausea, vomiting.  Psoriasis, psoriatic arthritis -Currently not on any treatment.  Morbid obesity -Patient has a BMI of 47.4 -Counseling done regarding diet and exercise - DVT  prophylaxis: SCDs Code Status:   Code Status: Full Code Family Communication: None Disposition Plan: Discharge to acute rehab, pending bed availability   Consultants:  [facility]  Procedures:  7/19: Upper GI endoscopy  Antimicrobials: None    Subjective: Denies having any complaints.  Sitting in the chair.  Patient's father is at bedside.     Objective: BP 125/62 (BP Location: Left Arm)   Pulse 73   Temp 100.1 F (37.8 C) (Oral)   Resp 16   Ht 5\' 5"  (1.651 m)   Wt 129.3 kg   SpO2 100%   BMI 47.43 kg/m   Examination:  General exam: Appears calm and comfortable Respiratory system: Clear to auscultation. Respiratory effort normal. Cardiovascular system: S1 & S2 heard, RRR. Gastrointestinal system: Abdomen is nondistended, soft and nontender. Normal bowel sounds heard. Central nervous system: Alert and oriented. No focal neurological deficits. Musculoskeletal: Right leg in splint. Skin: No cyanosis. No rashes Psychiatry: Judgement and insight appear normal. Mood & affect appropriate.    Data Reviewed: I have personally reviewed following labs and imaging studies  CBC Lab Results  Component Value Date   WBC 5.1 12/30/2022   RBC 3.18 (L) 12/30/2022   HGB 7.7 (L) 12/30/2022   HCT 25.2 (L) 12/30/2022   MCV 79.2 (L) 12/30/2022   MCH 24.2 (L) 12/30/2022   PLT 122 (L) 12/30/2022   MCHC 30.6 12/30/2022   RDW 20.3 (H) 12/30/2022   LYMPHSABS 2.0 08/01/2021   MONOABS 1.1 (H) 08/01/2021   EOSABS 0.0 08/01/2021   BASOSABS 0.0 08/01/2021     Last metabolic panel Lab Results  Component Value Date   NA 135 12/29/2022   K 4.3 12/29/2022   CL 104 12/29/2022   CO2 25 12/29/2022  BUN 12 12/29/2022   CREATININE 0.99 12/29/2022   GLUCOSE 109 (H) 12/29/2022   GFRNONAA >60 12/29/2022   GFRAA >89 06/22/2013   CALCIUM 7.4 (L) 12/29/2022   PHOS 3.1 07/05/2022   PROT 4.8 (L) 12/26/2022   ALBUMIN 2.2 (L) 12/26/2022   BILITOT 2.6 (H) 12/26/2022   ALKPHOS 81  12/26/2022   AST 51 (H) 12/26/2022   ALT 31 12/26/2022   ANIONGAP 6 12/29/2022    GFR: Estimated Creatinine Clearance: 118.1 mL/min (by C-G formula based on SCr of 0.99 mg/dL).  Recent Results (from the past 240 hour(s))  Resp panel by RT-PCR (RSV, Flu A&B, Covid) Anterior Nasal Swab     Status: None   Collection Time: 12/25/22  4:30 PM   Specimen: Anterior Nasal Swab  Result Value Ref Range Status   SARS Coronavirus 2 by RT PCR NEGATIVE NEGATIVE Final   Influenza A by PCR NEGATIVE NEGATIVE Final   Influenza B by PCR NEGATIVE NEGATIVE Final    Comment: (NOTE) The Xpert Xpress SARS-CoV-2/FLU/RSV plus assay is intended as an aid in the diagnosis of influenza from Nasopharyngeal swab specimens and should not be used as a sole basis for treatment. Nasal washings and aspirates are unacceptable for Xpert Xpress SARS-CoV-2/FLU/RSV testing.  Fact Sheet for Patients: BloggerCourse.com  Fact Sheet for Healthcare Providers: SeriousBroker.it  This test is not yet approved or cleared by the [place] FDA and has been authorized for detection and/or diagnosis of SARS-CoV-2 by FDA under an Emergency Use Authorization (EUA). This EUA will remain in effect (meaning this test can be used) for the duration of the COVID-19 declaration under Section 564(b)(1) of the Act, 21 U.S.C. section 360bbb-3(b)(1), unless the authorization is terminated or revoked.     Resp Syncytial Virus by PCR NEGATIVE NEGATIVE Final    Comment: (NOTE) Fact Sheet for Patients: BloggerCourse.com  Fact Sheet for Healthcare Providers: SeriousBroker.it  This test is not yet approved or cleared by the [place] FDA and has been authorized for detection and/or diagnosis of SARS-CoV-2 by FDA under an Emergency Use Authorization (EUA). This EUA will remain in effect (meaning this test can be used) for the duration  of the COVID-19 declaration under Section 564(b)(1) of the Act, 21 U.S.C. section 360bbb-3(b)(1), unless the authorization is terminated or revoked.  Performed at [facility], [address], [place], [place] [address]   Surgical pcr screen     Status: None   Collection Time: 12/27/22 11:03 PM   Specimen: Nasal Mucosa; Nasal Swab  Result Value Ref Range Status   MRSA, PCR NEGATIVE NEGATIVE Final   Staphylococcus aureus NEGATIVE NEGATIVE Final    Comment: (NOTE) The Xpert SA Assay (FDA approved for NASAL specimens in patients 39 years of age and older), is one component of a comprehensive surveillance program. It is not intended to diagnose infection nor to guide or monitor treatment. Performed at [facility], [address] [place], [place] [address]       Radiology Studies: No results found.    LOS: 8 days    Susa Griffins, MD [facility] 01/02/2023, 4:08 PM   If 7PM-7AM, please contact night-coverage [email or web address] {Select_TRH_[email or web address]:[id number]}

## 2023-01-02 NOTE — Progress Notes (Signed)
IP rehab admissions - I met with patient at the bedside.  I gave him rehab booklets and explained CIR.  We have opened the case with [employer] and are awaiting OT evaluation.  I will update all once I hear back from insurance carrier.  Call for questions.  [phone]

## 2023-01-02 NOTE — Progress Notes (Signed)
PROGRESS NOTE    Thomas [name]  OVF:[id number] DOB: Mar 22, 1976 DOA: 12/25/2022 PCP: [name], MD   Brief Narrative: No notes on file   Assessment and Plan:  47 year old male with history of cirrhosis, esophageal varices, alcohol abuse, obesity, obstructive sleep apnea, psoriatic arthritis, anemia presents to the emergency department with complaints of nausea, vomiting, hematemesis.  Patient had a recent ER visit for right foot fracture reduced under sedation, since then has been experiencing nausea, vomiting.  GI is consulted, underwent EGD on 12/26/2022, found to have 3 large columns of nonbleeding esophageal varices s/p 4 band ligations in addition to a large clot/altered body in the fundus of the stomach, underwent suction, irrigation also found to have portal hypertensive gastropathy.  GI is recommended to continue with the Protonix, Rocephin for 5 days.  Upper GI bleed due to esophageal varices -S/p EGD s/p banding x 4 -Continue with IV Protonix -Continue with Rocephin for SBP prophylaxis -Started the patient on soft diet.  Acute blood loss anemia -Secondary to GI bleed -Patient has baseline hemoglobin of 11, admitted with hemoglobin 7, transfused 1 unit of PRBC -Hemoglobin has been trending down -Keep the patient on IV iron.  Recent right ankle fracture -S/p closed reduction -S/p ORIF on 12/28/2022 -Continue with nonweightbearing status to the right lower extremity -Continue with physical therapy, Occupational Therapy -Plan to discharge patient to SNF.  Alcoholic cirrhosis -Continue with the propranolol, Protonix.  Thrombocytopenia -Secondary to cirrhosis of the liver -Closely follow-up.  Obstructive sleep apnea -Continue with the CPAP if patient does not have any nausea, vomiting.  Psoriasis, psoriatic arthritis -Currently not on any treatment.  Morbid obesity -Patient has a BMI of 47.4 -Counseling done regarding diet and exercise - DVT  prophylaxis: SCDs Code Status:   Code Status: Full Code Family Communication: None Disposition Plan: Discharge to acute rehab, pending bed availability   Consultants:   GI  Procedures:  7/19: Upper GI endoscopy  Antimicrobials: None    Subjective: Denies having any complaints.  Sitting in the chair.  Patient's father is at bedside.     Objective: BP 125/62 (BP Location: Left Arm)   Pulse 73   Temp 100.1 F (37.8 C) (Oral)   Resp 16   Ht 5\' 5"  (1.651 m)   Wt 129.3 kg   SpO2 100%   BMI 47.43 kg/m   Examination:  General exam: Appears calm and comfortable Respiratory system: Clear to auscultation. Respiratory effort normal. Cardiovascular system: S1 & S2 heard, RRR. Gastrointestinal system: Abdomen is nondistended, soft and nontender. Normal bowel sounds heard. Central nervous system: Alert and oriented. No focal neurological deficits. Musculoskeletal: Right leg in splint. Skin: No cyanosis. No rashes Psychiatry: Judgement and insight appear normal. Mood & affect appropriate.    Data Reviewed: I have personally reviewed following labs and imaging studies  CBC Lab Results  Component Value Date   WBC 5.1 12/30/2022   RBC 3.18 (L) 12/30/2022   HGB 7.7 (L) 12/30/2022   HCT 25.2 (L) 12/30/2022   MCV 79.2 (L) 12/30/2022   MCH 24.2 (L) 12/30/2022   PLT 122 (L) 12/30/2022   MCHC 30.6 12/30/2022   RDW 20.3 (H) 12/30/2022   LYMPHSABS 2.0 08/01/2021   MONOABS 1.1 (H) 08/01/2021   EOSABS 0.0 08/01/2021   BASOSABS 0.0 08/01/2021     Last metabolic panel Lab Results  Component Value Date   NA 135 12/29/2022   K 4.3 12/29/2022   CL 104 12/29/2022   CO2 25 12/29/2022  BUN 12 12/29/2022   CREATININE 0.99 12/29/2022   GLUCOSE 109 (H) 12/29/2022   GFRNONAA >60 12/29/2022   GFRAA >89 06/22/2013   CALCIUM 7.4 (L) 12/29/2022   PHOS 3.1 07/05/2022   PROT 4.8 (L) 12/26/2022   ALBUMIN 2.2 (L) 12/26/2022   BILITOT 2.6 (H) 12/26/2022   ALKPHOS 81  12/26/2022   AST 51 (H) 12/26/2022   ALT 31 12/26/2022   ANIONGAP 6 12/29/2022    GFR: Estimated Creatinine Clearance: 118.1 mL/min (by C-G formula based on SCr of 0.99 mg/dL).  Recent Results (from the past 240 hour(s))  Resp panel by RT-PCR (RSV, Flu A&B, Covid) Anterior Nasal Swab     Status: None   Collection Time: 12/25/22  4:30 PM   Specimen: Anterior Nasal Swab  Result Value Ref Range Status   SARS Coronavirus 2 by RT PCR NEGATIVE NEGATIVE Final   Influenza A by PCR NEGATIVE NEGATIVE Final   Influenza B by PCR NEGATIVE NEGATIVE Final    Comment: (NOTE) The Xpert Xpress SARS-CoV-2/FLU/RSV plus assay is intended as an aid in the diagnosis of influenza from Nasopharyngeal swab specimens and should not be used as a sole basis for treatment. Nasal washings and aspirates are unacceptable for Xpert Xpress SARS-CoV-2/FLU/RSV testing.  Fact Sheet for Patients: BloggerCourse.com  Fact Sheet for Healthcare Providers: SeriousBroker.it  This test is not yet approved or cleared by the [place] FDA and has been authorized for detection and/or diagnosis of SARS-CoV-2 by FDA under an Emergency Use Authorization (EUA). This EUA will remain in effect (meaning this test can be used) for the duration of the COVID-19 declaration under Section 564(b)(1) of the Act, 21 U.S.C. section 360bbb-3(b)(1), unless the authorization is terminated or revoked.     Resp Syncytial Virus by PCR NEGATIVE NEGATIVE Final    Comment: (NOTE) Fact Sheet for Patients: BloggerCourse.com  Fact Sheet for Healthcare Providers: SeriousBroker.it  This test is not yet approved or cleared by the [place] FDA and has been authorized for detection and/or diagnosis of SARS-CoV-2 by FDA under an Emergency Use Authorization (EUA). This EUA will remain in effect (meaning this test can be used) for the duration  of the COVID-19 declaration under Section 564(b)(1) of the Act, 21 U.S.C. section 360bbb-3(b)(1), unless the authorization is terminated or revoked.  Performed at [facility], [address], [place], [place] [address]   Surgical pcr screen     Status: None   Collection Time: 12/27/22 11:03 PM   Specimen: Nasal Mucosa; Nasal Swab  Result Value Ref Range Status   MRSA, PCR NEGATIVE NEGATIVE Final   Staphylococcus aureus NEGATIVE NEGATIVE Final    Comment: (NOTE) The Xpert SA Assay (FDA approved for NASAL specimens in patients 80 years of age and older), is one component of a comprehensive surveillance program. It is not intended to diagnose infection nor to guide or monitor treatment. Performed at [facility], [address] [place], [place] [address]       Radiology Studies: No results found.    LOS: 8 days    Susa Griffins, MD [facility] 01/02/2023, 4:06 PM   If 7PM-7AM, please contact night-coverage [email or web address]

## 2023-01-02 NOTE — Progress Notes (Signed)
PT Cancellation Note  Patient Details Name: Thomas [name] MRN: [id number] DOB: 05/26/1976   Cancelled Treatment:    Reason Eval/Treat Not Completed: Other (comment) (OT with pt. Will continue to follow up as able and appropriate)   Harrel Carina, DPT, CLT  Acute Rehabilitation Services Office: [phone] (Secure chat preferred)   Claudia Desanctis 01/02/2023, 3:03 PM

## 2023-01-03 DIAGNOSIS — K922 Gastrointestinal hemorrhage, unspecified: Secondary | ICD-10-CM | POA: Diagnosis not present

## 2023-01-03 LAB — CBC
HCT: 24.5 % — ABNORMAL LOW (ref 39.0–52.0)
Hemoglobin: 7.2 g/dL — ABNORMAL LOW (ref 13.0–17.0)
MCH: 23.4 pg — ABNORMAL LOW (ref 26.0–34.0)
MCHC: 29.4 g/dL — ABNORMAL LOW (ref 30.0–36.0)
MCV: 79.5 fL — ABNORMAL LOW (ref 80.0–100.0)
Platelets: 133 10*3/uL — ABNORMAL LOW (ref 150–400)
RBC: 3.08 MIL/uL — ABNORMAL LOW (ref 4.22–5.81)
RDW: 19.8 % — ABNORMAL HIGH (ref 11.5–15.5)
WBC: 3.6 10*3/uL — ABNORMAL LOW (ref 4.0–10.5)
nRBC: 0 % (ref 0.0–0.2)

## 2023-01-03 LAB — BASIC METABOLIC PANEL
Anion gap: 4 — ABNORMAL LOW (ref 5–15)
BUN: 10 mg/dL (ref 6–20)
CO2: 26 mmol/L (ref 22–32)
Calcium: 7.8 mg/dL — ABNORMAL LOW (ref 8.9–10.3)
Chloride: 108 mmol/L (ref 98–111)
Creatinine, Ser: 0.79 mg/dL (ref 0.61–1.24)
GFR, Estimated: >60 mL/min (ref >60)
Glucose, Bld: 106 mg/dL — ABNORMAL HIGH (ref 70–99)
Potassium: 4 mmol/L (ref 3.5–5.1)
Sodium: 138 mmol/L (ref 135–145)

## 2023-01-03 MED ORDER — FUROSEMIDE 40 MG PO TABS
40.0000 mg | ORAL_TABLET | Freq: Two times a day (BID) | ORAL | Status: DC
  Administered 2023-01-03 – 2023-01-07 (×9): 40 mg via ORAL
  Filled 2023-01-03 (×10): qty 1

## 2023-01-03 NOTE — Progress Notes (Signed)
PROGRESS NOTE    DEAUNTAE BERNHEISEL  ZOX:[id number] DOB: [birth year] DOA: 12/25/2022 PCP: [name], MD      No notes on file   Assessment and Plan:  47 year old male with history of cirrhosis, esophageal varices, alcohol abuse, obesity, obstructive sleep apnea, psoriatic arthritis, anemia presents to the emergency department with complaints of nausea, vomiting, hematemesis.  Patient had a recent ER visit for right foot fracture reduced under sedation, since then has been experiencing nausea, vomiting.  GI is consulted, underwent EGD on 12/26/2022, found to have 3 large columns of nonbleeding esophageal varices s/p 4 band ligations in addition to a large clot/altered body in the fundus of the stomach, underwent suction, irrigation also found to have portal hypertensive gastropathy.  GI is recommended to continue with the Protonix, Rocephin for 5 days.  Upper GI bleed due to esophageal varices -S/p EGD s/p banding x 4 -Continue with IV Protonix -Continue with Rocephin for SBP prophylaxis -Started the patient on soft diet.  Acute blood loss anemia -Secondary to GI bleed -Patient has baseline hemoglobin of 11, admitted with hemoglobin 7, transfused 1 unit of PRBC -Hemoglobin has been trending down -Keep the patient on IV iron.  Recent right ankle fracture -S/p closed reduction -S/p ORIF on 12/28/2022 -Continue with nonweightbearing status to the right lower extremity -Continue with physical therapy, Occupational Therapy -Plan to discharge patient to SNF.  Bilateral lower extremity edema -Keep the patient on oral Lasix  Alcoholic cirrhosis -Continue with the propranolol, Protonix.  Thrombocytopenia -Secondary to cirrhosis of the liver -Closely follow-up.  Obstructive sleep apnea -Continue with the CPAP if patient does not have any nausea, vomiting.  Psoriasis, psoriatic arthritis -Currently not on any treatment.  Morbid obesity -Patient has a BMI of  47.4 -Counseling done regarding diet and exercise - DVT prophylaxis: SCDs Code Status:   Code Status: Full Code Family Communication: None Disposition Plan: Discharge to acute rehab, pending bed availability   Consultants:   GI  Procedures:  7/19: Upper GI endoscopy  Antimicrobials: None    Subjective: Complaining of pain in the right lower extremity.  Encourage patient to elevate the legs as possible   Objective: BP (!) 104/48 (BP Location: Left Arm)   Pulse 68   Temp 97.6 F (36.4 C) (Oral)   Resp 16   Ht 5\' 5"  (1.651 m)   Wt 129.3 kg   SpO2 99%   BMI 47.43 kg/m   Examination:  General exam: Appears calm and comfortable Respiratory system: Clear to auscultation. Respiratory effort normal. Cardiovascular system: S1 & S2 heard, RRR. Gastrointestinal system: Abdomen is nondistended, soft and nontender. Normal bowel sounds heard. Central nervous system: Alert and oriented. No focal neurological deficits. Musculoskeletal: Right leg in splint.,  3-4+ pitting edema     Skin: No cyanosis. No rashes Psychiatry: Judgement and insight appear normal. Mood & affect appropriate.    Data Reviewed: I have personally reviewed following labs and imaging studies  CBC Lab Results  Component Value Date   WBC 5.1 12/30/2022   RBC 3.18 (L) 12/30/2022   HGB 7.7 (L) 12/30/2022   HCT 25.2 (L) 12/30/2022   MCV 79.2 (L) 12/30/2022   MCH 24.2 (L) 12/30/2022   PLT 122 (L) 12/30/2022   MCHC 30.6 12/30/2022   RDW 20.3 (H) 12/30/2022   LYMPHSABS 2.0 08/01/2021   MONOABS 1.1 (H) 08/01/2021   EOSABS 0.0 08/01/2021   BASOSABS 0.0 08/01/2021     Last metabolic panel Lab Results  Component Value  Date   NA 135 12/29/2022   K 4.3 12/29/2022   CL 104 12/29/2022   CO2 25 12/29/2022   BUN 12 12/29/2022   CREATININE 0.99 12/29/2022   GLUCOSE 109 (H) 12/29/2022   GFRNONAA >60 12/29/2022   GFRAA >89 06/22/2013   CALCIUM 7.4 (L) 12/29/2022   PHOS 3.1 07/05/2022   PROT  4.8 (L) 12/26/2022   ALBUMIN 2.2 (L) 12/26/2022   BILITOT 2.6 (H) 12/26/2022   ALKPHOS 81 12/26/2022   AST 51 (H) 12/26/2022   ALT 31 12/26/2022   ANIONGAP 6 12/29/2022    GFR: Estimated Creatinine Clearance: 118.1 mL/min (by C-G formula based on SCr of 0.99 mg/dL).  Recent Results (from the past 240 hour(s))  Resp panel by RT-PCR (RSV, Flu A&B, Covid) Anterior Nasal Swab     Status: None   Collection Time: 12/25/22  4:30 PM   Specimen: Anterior Nasal Swab  Result Value Ref Range Status   SARS Coronavirus 2 by RT PCR NEGATIVE NEGATIVE Final   Influenza A by PCR NEGATIVE NEGATIVE Final   Influenza B by PCR NEGATIVE NEGATIVE Final    Comment: (NOTE) The Xpert Xpress SARS-CoV-2/FLU/RSV plus assay is intended as an aid in the diagnosis of influenza from Nasopharyngeal swab specimens and should not be used as a sole basis for treatment. Nasal washings and aspirates are unacceptable for Xpert Xpress SARS-CoV-2/FLU/RSV testing.  Fact Sheet for Patients: BloggerCourse.com  Fact Sheet for Healthcare Providers: SeriousBroker.it  This test is not yet approved or cleared by the [place] FDA and has been authorized for detection and/or diagnosis of SARS-CoV-2 by FDA under an Emergency Use Authorization (EUA). This EUA will remain in effect (meaning this test can be used) for the duration of the COVID-19 declaration under Section 564(b)(1) of the Act, 21 U.S.C. section 360bbb-3(b)(1), unless the authorization is terminated or revoked.     Resp Syncytial Virus by PCR NEGATIVE NEGATIVE Final    Comment: (NOTE) Fact Sheet for Patients: BloggerCourse.com  Fact Sheet for Healthcare Providers: SeriousBroker.it  This test is not yet approved or cleared by the [place] FDA and has been authorized for detection and/or diagnosis of SARS-CoV-2 by FDA under an Emergency Use  Authorization (EUA). This EUA will remain in effect (meaning this test can be used) for the duration of the COVID-19 declaration under Section 564(b)(1) of the Act, 21 U.S.C. section 360bbb-3(b)(1), unless the authorization is terminated or revoked.  Performed at [facility], [address], [place], [place] [address]   Surgical pcr screen     Status: None   Collection Time: 12/27/22 11:03 PM   Specimen: Nasal Mucosa; Nasal Swab  Result Value Ref Range Status   MRSA, PCR NEGATIVE NEGATIVE Final   Staphylococcus aureus NEGATIVE NEGATIVE Final    Comment: (NOTE) The Xpert SA Assay (FDA approved for NASAL specimens in patients 62 years of age and older), is one component of a comprehensive surveillance program. It is not intended to diagnose infection nor to guide or monitor treatment. Performed at [facility], [address] [place], [place] [address]       Radiology Studies: No results found.    LOS: 9 days    Susa Griffins, MD [facility] 01/03/2023, 1:58 PM   If 7PM-7AM, please contact night-coverage [email or web address]

## 2023-01-03 NOTE — Progress Notes (Signed)
Occupational Therapy Treatment Patient Details Name: Thomas [name] MRN: [id number] DOB: [birth year] Today's Date: 01/03/2023   History of present illness Patient is a 47 y/o male admitted 12/25/22 due to upper GI bleed now s/p EGD on 7/19 with banding of esophageal varices.  Patient also with recent R ankle bimalleolar fx and underwent ORIF on 12/28/22.  PMH positive for obesity, sleep apnea, psoriatic arthritis, ETOH related cirrhosis, portal hypertensive gastropathy, esophageal and gastric varices.   OT comments  Pt continuing to progress in OT sessions, demonstrates good adherence to WB precautions but does need cues for RW postioning and safety to reduce outward movements beyond BOS. Educated pt on the use of UE support to perform and reaching from floor. Pt did need motivation from his sister to partake in therapy since he just finished working with PT but was receptive following encouragement. He did have decreased activity tolerance, needing 1 seated and 1 standing rest break with a cue for propping trunk against sturdy surfaces (counter) for ambulating ~70ft in hall. However pt may have been fatigued from PT session. OT to continue to progress pt as able, DC plans remain appropriate for AIR.    Recommendations for follow up therapy are one component of a multi-disciplinary discharge planning process, led by the attending physician.  Recommendations may be updated based on patient status, additional functional criteria and insurance authorization.    Assistance Recommended at Discharge PRN  Patient can return home with the following  A little help with bathing/dressing/bathroom;Assist for transportation;Help with stairs or ramp for entrance   Equipment Recommendations  Tub/shower bench;Wheelchair (measurements OT) (will continue to asses in treatment to determine if he would benefit from shower bench initially)    Recommendations for Other Services      Precautions / Restrictions  Precautions Precautions: Fall Required Braces or Orthoses: Splint/Cast Splint/Cast: R foot/ankle Restrictions Weight Bearing Restrictions: Yes RLE Weight Bearing: Non weight bearing       Mobility Bed Mobility Overal bed mobility: Modified Independent                  Transfers Overall transfer level: Needs assistance Equipment used: Rolling walker (2 wheels) Transfers: Sit to/from Stand Sit to Stand: Min guard           General transfer comment: Pt able to adhere to weightbearing restrictions throughout sit to stand and stand step transfers.     Balance Overall balance assessment: Needs assistance Sitting-balance support: Feet supported Sitting balance-Leahy Scale: Good     Standing balance support: Bilateral upper extremity supported, During functional activity, Reliant on assistive device for balance Standing balance-Leahy Scale: Poor Standing balance comment: Pt needs UE support                           ADL either performed or assessed with clinical judgement   ADL Overall ADL's : Needs assistance/impaired     Grooming: Standing;Min guard Grooming Details (indicate cue type and reason): Educated pt on the use of hip postioning and trunk propping to support weight in standing against surfaces and reducing physical exertion of LLE.             Lower Body Dressing: Sitting/lateral leans;Supervision/safety Lower Body Dressing Details (indicate cue type and reason): don socks with supervision using bed to assist with propping LLE. Toilet Transfer: Rolling walker (2 wheels);Min guard   Toileting- Architect and Hygiene: Min guard;Sit to/from stand  Functional mobility during ADLs: Min guard;Rolling walker (2 wheels) General ADL Comments: Educated pt on the use of UE support to complete dynamic reaching for objects on the floor, pt successfully completed activity but provided with min cues for safety and optimal RW position  to maintain BOS. He did elicit pain in hip flexors after movement, reinforced hip flexor stretches. Also discussed with pt getting a reacher if desired.      Cognition Arousal/Alertness: Awake/alert Behavior During Therapy: WFL for tasks assessed/performed Overall Cognitive Status: Within Functional Limits for tasks assessed                             Pertinent Vitals/ Pain       Pain Assessment Pain Assessment: Faces Faces Pain Scale: Hurts even more Pain Location: right ankle and low back Pain Descriptors / Indicators: Discomfort, Grimacing, Guarding, Throbbing Pain Intervention(s): Monitored during session, Repositioned, Limited activity within patient's tolerance   Frequency  Min 1X/week        Progress Toward Goals  OT Goals(current goals can now be found in the care plan section)  Progress towards OT goals: Progressing toward goals  Acute Rehab OT Goals Patient Stated Goal: Pt wants to be able to get up and down his steps at home and be as independent as he can since his father cannot provide physical assist. OT Goal Formulation: With patient Time For Goal Achievement: 01/16/23 Potential to Achieve Goals: Good  Plan Discharge plan remains appropriate;Frequency remains appropriate    AM-PAC OT "6 Clicks" Daily Activity     Outcome Measure   Help from another person eating meals?: None Help from another person taking care of personal grooming?: A Little Help from another person toileting, which includes using toliet, bedpan, or urinal?: A Little Help from another person bathing (including washing, rinsing, drying)?: A Little Help from another person to put on and taking off regular upper body clothing?: A Little Help from another person to put on and taking off regular lower body clothing?: A Little 6 Click Score: 19    End of Session Equipment Utilized During Treatment: Gait belt;Rolling walker (2 wheels)  OT Visit Diagnosis: Unsteadiness on feet  (R26.81);Muscle weakness (generalized) (M62.81);Pain Pain - Right/Left: Right Pain - part of body: Ankle and joints of foot   Activity Tolerance Patient tolerated treatment well   Patient Left in bed;with call bell/phone within reach;with family/visitor present   Nurse Communication Mobility status        Time: 1710-1736 OT Time Calculation (min): 26 min  Charges: OT General Charges $OT Visit: 1 Visit OT Treatments $Therapeutic Activity: 23-37 mins  01/03/2023  AB, OTR/L  Acute Rehabilitation Services  Office: [phone]   Tristan Schroeder 01/03/2023, 6:09 PM

## 2023-01-03 NOTE — Progress Notes (Signed)
PT Cancellation Note  Patient Details Name: Thomas [name] MRN: [id number] DOB: 03-23-1976   Cancelled Treatment:    Reason Eval/Treat Not Completed: (P) Other (comment) (Pt reports fatigue from recent trip to the bathroom and requesting time to recover and asking PT to return later this pm.  Will f/u per POC as time permits.)    Artis Delay 01/03/2023, 1:56 PM  Bonney Leitz , PTA Acute Rehabilitation Services Office [phone]

## 2023-01-03 NOTE — Progress Notes (Signed)
Physical Therapy Treatment Patient Details Name: Thomas [name] MRN: [id number] DOB: 1975-08-08 Today's Date: 01/03/2023   History of Present Illness Patient is a 47 y/o male admitted 12/25/22 due to upper GI bleed now s/p EGD on 7/19 with banding of esophageal varices.  Patient also with recent R ankle bimalleolar fx and underwent ORIF on 12/28/22.  PMH positive for obesity, sleep apnea, psoriatic arthritis, ETOH related cirrhosis, portal hypertensive gastropathy, esophageal and gastric varices.    PT Comments  Pt supine in bed on arrival but he is pleasant and agreeable to PT session this pm.  Focused on B LE strengthening as he lacks strength to negotiate stairs to enter his home.  He is deconditioned and required several rest periods but remains motivated to work hard to meet his goals.  Continue to recommend aggressive rehab in a post acute setting to improve functional mobility and promote independence.      Assistance Recommended at Discharge    If plan is discharge home, recommend the following:  Can travel by private vehicle    A little help with walking and/or transfers;Assistance with cooking/housework;Assist for transportation;Help with stairs or ramp for entrance      Equipment Recommendations  Rolling walker (2 wheels);Hospital bed;BSC/3in1    Recommendations for Other Services Rehab consult     Precautions / Restrictions Precautions Precautions: Fall Required Braces or Orthoses: Splint/Cast Splint/Cast: R foot/ankle Restrictions Weight Bearing Restrictions: Yes RLE Weight Bearing: Non weight bearing     Mobility  Bed Mobility Overal bed mobility: Modified Independent             General bed mobility comments: Increased time and effort to move to edge of bed and back to bed but no assistance needed,    Transfers Overall transfer level: Needs assistance Equipment used: Rolling walker (2 wheels) Transfers: Sit to/from Stand Sit to Stand: Min guard            General transfer comment: Pt able to adhere to weightbearing restrictions throughout sit to stand and stand step transfers.  Cues for hand placement and safety.    Ambulation/Gait Ambulation/Gait assistance: Min assist Gait Distance (Feet): 30 Feet (+ 10 ft) Assistive device: Rolling walker (2 wheels) (bariatric) Gait Pattern/deviations: Step-to pattern, Trunk flexed Gait velocity: Decreased velocity     General Gait Details: Cues for sequencing and safety this session.  He fatigues quickly with mobility.   Stairs             Wheelchair Mobility     Tilt Bed    Modified Rankin (Stroke Patients Only)       Balance                                            Cognition Arousal/Alertness: Awake/alert Behavior During Therapy: WFL for tasks assessed/performed Overall Cognitive Status: Within Functional Limits for tasks assessed                                          Exercises General Exercises - Lower Extremity Ankle Circles/Pumps: AROM, Left, 10 reps, Supine Quad Sets: AROM, Both, 10 reps, Supine Long Arc Quad: AROM, Both, 10 reps, Seated Heel Slides: AROM, Both, 10 reps, Supine Hip ABduction/ADduction: AROM, Both, 10 reps, Supine Straight Leg Raises: AROM, Both,  10 reps, Supine    General Comments        Pertinent Vitals/Pain Pain Assessment Pain Assessment: Faces Faces Pain Scale: Hurts even more Pain Location: right ankle Pain Descriptors / Indicators: Discomfort, Grimacing, Guarding Pain Intervention(s): Monitored during session, Repositioned    Home Living                          Prior Function            PT Goals (current goals can now be found in the care plan section) Acute Rehab PT Goals Patient Stated Goal: to return home and mobilize safely Potential to Achieve Goals: Good Additional Goals Additional Goal #1: Patient to propel w/c x 100' with S. Progress towards PT goals:  Progressing toward goals    Frequency    Min 3X/week      PT Plan Current plan remains appropriate    Co-evaluation              AM-PAC PT "6 Clicks" Mobility   Outcome Measure  Help needed turning from your back to your side while in a flat bed without using bedrails?: None Help needed moving from lying on your back to sitting on the side of a flat bed without using bedrails?: None Help needed moving to and from a bed to a chair (including a wheelchair)?: A Little Help needed standing up from a chair using your arms (e.g., wheelchair or bedside chair)?: A Little Help needed to walk in hospital room?: A Little Help needed climbing 3-5 steps with a railing? : Total 6 Click Score: 18    End of Session Equipment Utilized During Treatment: Gait belt Activity Tolerance: Patient tolerated treatment well Patient left: with call bell/phone within reach;with family/visitor present;in bed Nurse Communication: Mobility status PT Visit Diagnosis: Other abnormalities of gait and mobility (R26.89);Difficulty in walking, not elsewhere classified (R26.2);Pain Pain - Right/Left: Right Pain - part of body: Ankle and joints of foot     Time: [id number] PT Time Calculation (min) (ACUTE ONLY): 17 min  Charges:    $Therapeutic Activity: 8-22 mins PT General Charges $$ ACUTE PT VISIT: 1 Visit                     Bonney Leitz , PTA Acute Rehabilitation Services Office [phone]    Florestine Avers 01/03/2023, 4:40 PM

## 2023-01-04 DIAGNOSIS — K922 Gastrointestinal hemorrhage, unspecified: Secondary | ICD-10-CM | POA: Diagnosis not present

## 2023-01-04 NOTE — Progress Notes (Signed)
PROGRESS NOTE    Thomas [name]  WGN:[id number] DOB: May 08, 1976 DOA: 12/25/2022 PCP: [name], MD      No notes on file   Assessment and Plan:  47 year old male with history of cirrhosis, esophageal varices, alcohol abuse, obesity, obstructive sleep apnea, psoriatic arthritis, anemia presents to the emergency department with complaints of nausea, vomiting, hematemesis.  Patient had a recent ER visit for right foot fracture reduced under sedation, since then has been experiencing nausea, vomiting.  GI is consulted, underwent EGD on 12/26/2022, found to have 3 large columns of nonbleeding esophageal varices s/p 4 band ligations in addition to a large clot/altered body in the fundus of the stomach, underwent suction, irrigation also found to have portal hypertensive gastropathy.  GI is recommended to continue with the Protonix, Rocephin for 5 days.  Upper GI bleed due to esophageal varices -S/p EGD s/p banding x 4 -Continue with IV Protonix -Continue with Rocephin for SBP prophylaxis -Started the patient on soft diet.  Acute blood loss anemia -Secondary to GI bleed -Patient has baseline hemoglobin of 11, admitted with hemoglobin 7, transfused 1 unit of PRBC -Hemoglobin has been trending down -Keep the patient on IV iron. -iron profile showed iron 87, TIBC 380, % saturations 23, ferritin 16 folate 13, B12 greater than 1500  Recent right ankle fracture -S/p closed reduction -S/p ORIF on 12/28/2022 -Continue with nonweightbearing status to the right lower extremity -Continue with physical therapy, Occupational Therapy -Plan to discharge patient to SNF.  Bilateral lower extremity edema -Keep the patient on oral Lasix - get lower extremity venous Dopplers  Alcoholic cirrhosis -Continue with the propranolol, Protonix.  Thrombocytopenia -Secondary to cirrhosis of the liver -Closely follow-up.  Obstructive sleep apnea -Continue with the CPAP if patient does not have  any nausea, vomiting.  Psoriasis, psoriatic arthritis -Currently not on any treatment.  Morbid obesity -Patient has a BMI of 47.4 -Counseling done regarding diet and exercise - DVT prophylaxis: SCDs Code Status:   Code Status: Full Code Family Communication: None Disposition Plan: Discharge to acute rehab, pending bed availability   Consultants:  [facility]  Procedures:  7/19: Upper GI endoscopy  Antimicrobials: None    Subjective:  Mild improvement with the lower extremity edema, pressure on the leg.  Objective: BP 114/68 (BP Location: Right Wrist)   Pulse 67   Temp 99.4 F (37.4 C) (Axillary)   Resp 16   Ht 5\' 5"  (1.651 m)   Wt 129.3 kg   SpO2 100%   BMI 47.43 kg/m   Examination:  General exam: Appears calm and comfortable Respiratory system: Clear to auscultation. Respiratory effort normal. Cardiovascular system: S1 & S2 heard, RRR. Gastrointestinal system: Abdomen is nondistended, soft and nontender. Normal bowel sounds heard. Central nervous system: Alert and oriented. No focal neurological deficits. Musculoskeletal: Right leg in splint.,  3-4+ pitting edema     Skin: No cyanosis. No rashes Psychiatry: Judgement and insight appear normal. Mood & affect appropriate.    Data Reviewed: I have personally reviewed following labs and imaging studies  CBC Lab Results  Component Value Date   WBC 3.6 (L) 01/03/2023   RBC 3.08 (L) 01/03/2023   HGB 7.2 (L) 01/03/2023   HCT 24.5 (L) 01/03/2023   MCV 79.5 (L) 01/03/2023   MCH 23.4 (L) 01/03/2023   PLT 133 (L) 01/03/2023   MCHC 29.4 (L) 01/03/2023   RDW 19.8 (H) 01/03/2023   LYMPHSABS 2.0 08/01/2021   MONOABS 1.1 (H) 08/01/2021   EOSABS  0.0 08/01/2021   BASOSABS 0.0 08/01/2021     Last metabolic panel Lab Results  Component Value Date   NA 138 01/03/2023   K 4.0 01/03/2023   CL 108 01/03/2023   CO2 26 01/03/2023   BUN 10 01/03/2023   CREATININE 0.79 01/03/2023   GLUCOSE 106 (H) 01/03/2023    GFRNONAA >60 01/03/2023   GFRAA >89 06/22/2013   CALCIUM 7.8 (L) 01/03/2023   PHOS 3.1 07/05/2022   PROT 4.8 (L) 12/26/2022   ALBUMIN 2.2 (L) 12/26/2022   BILITOT 2.6 (H) 12/26/2022   ALKPHOS 81 12/26/2022   AST 51 (H) 12/26/2022   ALT 31 12/26/2022   ANIONGAP 4 (L) 01/03/2023    GFR: Estimated Creatinine Clearance: 146.1 mL/min (by C-G formula based on SCr of 0.79 mg/dL).  Recent Results (from the past 240 hour(s))  Surgical pcr screen     Status: None   Collection Time: 12/27/22 11:03 PM   Specimen: Nasal Mucosa; Nasal Swab  Result Value Ref Range Status   MRSA, PCR NEGATIVE NEGATIVE Final   Staphylococcus aureus NEGATIVE NEGATIVE Final    Comment: (NOTE) The Xpert SA Assay (FDA approved for NASAL specimens in patients 75 years of age and older), is one component of a comprehensive surveillance program. It is not intended to diagnose infection nor to guide or monitor treatment. Performed at [facility], [address] [place], [place] [address]       Radiology Studies: No results found.    LOS: 10 days    Susa Griffins, MD [facility] 01/04/2023, 6:39 PM   If 7PM-7AM, please contact night-coverage [email or web address]

## 2023-01-04 NOTE — Plan of Care (Signed)
  Problem: Nutrition: Goal: Adequate nutrition will be maintained Outcome: Progressing   Problem: Elimination: Goal: Will not experience complications related to bowel motility Outcome: Progressing   Problem: Pain Managment: Goal: General experience of comfort will improve Outcome: Progressing   Problem: Safety: Goal: Ability to remain free from injury will improve Outcome: Progressing   

## 2023-01-05 ENCOUNTER — Inpatient Hospital Stay (HOSPITAL_COMMUNITY): Payer: BC Managed Care – PPO

## 2023-01-05 DIAGNOSIS — R609 Edema, unspecified: Secondary | ICD-10-CM

## 2023-01-05 DIAGNOSIS — K922 Gastrointestinal hemorrhage, unspecified: Secondary | ICD-10-CM | POA: Diagnosis not present

## 2023-01-05 NOTE — Consult Note (Signed)
   [facility] Inpatient Consult   01/05/2023  Thomas [name] Sep 05, 1975 [id number]  [facility] [[facility]]  Accountable Care Organization [ACO] Patient: [name]  Primary Care Provider:  [name], MD with [place] [facility] which is listed for the [facility] follow up   Patient screened for hospitalization with noted 10 day length of stay and disposition to assess for potential [facility]  [[facility]] Care Management service needs for post hospital transition for care coordination.  Review of patient's electronic medical record reveals patient is being recommended for a CIR transition from acute hospital. Patient for CIR at [facility] noted.   Plan:  Currently, awaiting insurance authorization for CIR. Pending disposition.   Of note, [facility] Care Management/Population Health does not replace or interfere with any arrangements made by the Inpatient Transition of Care team.  For questions contact:   Charlesetta Shanks, RN BSN CCM [facility]  [phone] business mobile phone Toll free office [phone]  *Concierge Line  [phone] Fax number: [phone] [email or web address]

## 2023-01-05 NOTE — Progress Notes (Signed)
IP rehab admissions - updated insurance carrier with additional information this morning.  Awaiting insurance decision regarding potential CIR admission.  [phone]

## 2023-01-05 NOTE — Progress Notes (Signed)
PROGRESS NOTE    Thomas [name]  YQI:[id number] DOB: 08-10-1975 DOA: 12/25/2022 PCP: [name], MD      No notes on file   Assessment and Plan:  47 year old male with history of cirrhosis, esophageal varices, alcohol abuse, obesity, obstructive sleep apnea, psoriatic arthritis, anemia presents to the emergency department with complaints of nausea, vomiting, hematemesis.  Patient had a recent ER visit for right foot fracture reduced under sedation, since then has been experiencing nausea, vomiting.  GI is consulted, underwent EGD on 12/26/2022, found to have 3 large columns of nonbleeding esophageal varices s/p 4 band ligations in addition to a large clot/altered body in the fundus of the stomach, underwent suction, irrigation also found to have portal hypertensive gastropathy.  GI is recommended to continue with the Protonix, Rocephin for 5 days.  Upper GI bleed due to esophageal varices -S/p EGD s/p banding x 4 -Continue with IV Protonix -Continue with Rocephin for SBP prophylaxis -Started the patient on soft diet.  Acute blood loss anemia -Secondary to GI bleed -Patient has baseline hemoglobin of 11, admitted with hemoglobin 7, transfused 1 unit of PRBC -Hemoglobin has been trending down -Keep the patient on IV iron. -iron profile showed iron 87, TIBC 380, % saturations 23, ferritin 16 folate 13, B12 greater than 1500  Recent right ankle fracture -S/p closed reduction -S/p ORIF on 12/28/2022 -Continue with nonweightbearing status to the right lower extremity -Continue with physical therapy, Occupational Therapy -Plan to discharge patient to SNF.  Bilateral lower extremity edema -Keep the patient on oral Lasix - get lower extremity venous Dopplers- negative  Alcoholic cirrhosis -Continue with the propranolol, Protonix.  Thrombocytopenia -Secondary to cirrhosis of the liver -Closely follow-up.  Obstructive sleep apnea -Continue with the CPAP if patient  does not have any nausea, vomiting.  Psoriasis, psoriatic arthritis -Currently not on any treatment.  Morbid obesity -Patient has a BMI of 47.4 -Counseling done regarding diet and exercise - DVT prophylaxis: SCDs Code Status:   Code Status: Full Code Family Communication: None Disposition Plan: Discharge to acute rehab, pending bed availability   Consultants:  [facility]  Procedures:  7/19: Upper GI endoscopy  Antimicrobials: None    Subjective:  Mild improvement with the lower extremity edema, pressure on the leg.  Objective: BP (!) 131/59 (BP Location: Left Wrist)   Pulse 73   Temp 98.3 F (36.8 C) (Oral)   Resp 16   Ht 5\' 5"  (1.651 m)   Wt 129.3 kg   SpO2 100%   BMI 47.43 kg/m   Examination:  General exam: Appears calm and comfortable Respiratory system: Clear to auscultation. Respiratory effort normal. Cardiovascular system: S1 & S2 heard, RRR. Gastrointestinal system: Abdomen is nondistended, soft and nontender. Normal bowel sounds heard. Central nervous system: Alert and oriented. No focal neurological deficits. Musculoskeletal: Right leg in splint.,  3-4+ pitting edema     Skin: No cyanosis. No rashes Psychiatry: Judgement and insight appear normal. Mood & affect appropriate.    Data Reviewed: I have personally reviewed following labs and imaging studies  CBC Lab Results  Component Value Date   WBC 3.6 (L) 01/03/2023   RBC 3.08 (L) 01/03/2023   HGB 7.2 (L) 01/03/2023   HCT 24.5 (L) 01/03/2023   MCV 79.5 (L) 01/03/2023   MCH 23.4 (L) 01/03/2023   PLT 133 (L) 01/03/2023   MCHC 29.4 (L) 01/03/2023   RDW 19.8 (H) 01/03/2023   LYMPHSABS 2.0 08/01/2021   MONOABS 1.1 (H) 08/01/2021  EOSABS 0.0 08/01/2021   BASOSABS 0.0 08/01/2021     Last metabolic panel Lab Results  Component Value Date   NA 138 01/03/2023   K 4.0 01/03/2023   CL 108 01/03/2023   CO2 26 01/03/2023   BUN 10 01/03/2023   CREATININE 0.79 01/03/2023   GLUCOSE 106 (H)  01/03/2023   GFRNONAA >60 01/03/2023   GFRAA >89 06/22/2013   CALCIUM 7.8 (L) 01/03/2023   PHOS 3.1 07/05/2022   PROT 4.8 (L) 12/26/2022   ALBUMIN 2.2 (L) 12/26/2022   BILITOT 2.6 (H) 12/26/2022   ALKPHOS 81 12/26/2022   AST 51 (H) 12/26/2022   ALT 31 12/26/2022   ANIONGAP 4 (L) 01/03/2023    GFR: Estimated Creatinine Clearance: 146.1 mL/min (by C-G formula based on SCr of 0.79 mg/dL).  Recent Results (from the past 240 hour(s))  Surgical pcr screen     Status: None   Collection Time: 12/27/22 11:03 PM   Specimen: Nasal Mucosa; Nasal Swab  Result Value Ref Range Status   MRSA, PCR NEGATIVE NEGATIVE Final   Staphylococcus aureus NEGATIVE NEGATIVE Final    Comment: (NOTE) The Xpert SA Assay (FDA approved for NASAL specimens in patients 75 years of age and older), is one component of a comprehensive surveillance program. It is not intended to diagnose infection nor to guide or monitor treatment. Performed at [facility], [address], [place], [place] [address]       Radiology Studies: VAS [place] LOWER EXTREMITY VENOUS (DVT)  Result Date: 01/05/2023  Lower Venous DVT Study [name]  Date of Exam:   01/05/2023 Medical Rec #: [id number]       Accession #:    [id number] Date of Birth: 1976-03-25        Patient Gender: M Patient Age:   89 years Exam Location:  [facility] Procedure:      VAS [place] LOWER EXTREMITY VENOUS (DVT) Referring Phys: Clerance Lav  --------------------------------------------------------------------------------  Indications: Edema.  Risk Factors: Trauma Right foot fracture obesity. Limitations: Bandages. Comparison Study: No prior study Performing Technologist: Shona Simpson  Examination Guidelines: A complete evaluation includes B-mode imaging, spectral Doppler, color Doppler, and power Doppler as needed of all accessible portions of each vessel. Bilateral testing is considered an integral part of a complete examination. Limited  examinations for reoccurring indications may be performed as noted. The reflux portion of the exam is performed with the patient in reverse Trendelenburg.  +---------+---------------+---------+-----------+----------+--------------+ RIGHT    CompressibilityPhasicitySpontaneityPropertiesThrombus Aging +---------+---------------+---------+-----------+----------+--------------+ CFV      Full           Yes      Yes                                 +---------+---------------+---------+-----------+----------+--------------+ SFJ      Full                                                        +---------+---------------+---------+-----------+----------+--------------+ FV Prox  Full                                                        +---------+---------------+---------+-----------+----------+--------------+  FV Mid   Full                                                        +---------+---------------+---------+-----------+----------+--------------+ FV DistalFull                                                        +---------+---------------+---------+-----------+----------+--------------+ PFV      Full                                                        +---------+---------------+---------+-----------+----------+--------------+ POP      Full           Yes      Yes                                 +---------+---------------+---------+-----------+----------+--------------+ PTV      Full                                                        +---------+---------------+---------+-----------+----------+--------------+ PERO     Full                                                        +---------+---------------+---------+-----------+----------+--------------+ Examination of Right PTVs and Peroeal Veins were limited to the top of the calf due to bandaging.  +---------+---------------+---------+-----------+----------+--------------+ LEFT      CompressibilityPhasicitySpontaneityPropertiesThrombus Aging +---------+---------------+---------+-----------+----------+--------------+ CFV      Full           Yes      Yes                                 +---------+---------------+---------+-----------+----------+--------------+ SFJ      Full                                                        +---------+---------------+---------+-----------+----------+--------------+ FV Prox  Full                                                        +---------+---------------+---------+-----------+----------+--------------+ FV Mid   Full                                                        +---------+---------------+---------+-----------+----------+--------------+  FV DistalFull                                                        +---------+---------------+---------+-----------+----------+--------------+ PFV      Full                                                        +---------+---------------+---------+-----------+----------+--------------+ POP      Full           Yes      Yes                                 +---------+---------------+---------+-----------+----------+--------------+ PTV      Full                                                        +---------+---------------+---------+-----------+----------+--------------+ PERO     Full                                                        +---------+---------------+---------+-----------+----------+--------------+    Summary: BILATERAL: - No evidence of deep vein thrombosis seen in the lower extremities, bilaterally. -No evidence of popliteal cyst, bilaterally.   *See table(s) above for measurements and observations.    Preliminary       LOS: 11 days    Susa Griffins, MD [facility] 01/05/2023, 5:12 PM   If 7PM-7AM, please contact night-coverage [email or web address]

## 2023-01-05 NOTE — Progress Notes (Signed)
Occupational Therapy Treatment Patient Details Name: DEKHARI LANGOWSKI MRN: [id number] DOB: 01/10/1976 Today's Date: 01/05/2023   History of present illness Patient is a 47 y/o male admitted 12/25/22 due to upper GI bleed now s/p EGD on 7/19 with banding of esophageal varices.  Patient also with recent R ankle bimalleolar fx and underwent ORIF on 12/28/22.  PMH positive for obesity, sleep apnea, psoriatic arthritis, ETOH related cirrhosis, portal hypertensive gastropathy, esophageal and gastric varices.   OT comments  Patient sitting in recliner upon arrival into room and expressed interest in shaving face at sink.  Patient reportsed that he would rather sit to complete task.  Patient completed sit to stand and functional mobility using RW into bathroom using RW with min guard.  Patient transfer to chair placed in front on sink for patient to complete grooming task of shaving face at seated level.  Patient educated  that patient could complete grooming tasks and bathing task at seated level that would take longer to conserve energy and prevent patient from LOB during standing grooming that would cause early fatigue.  Patient completed seated grooming task with distant supervision.  Patient completed sit to stand from chair with min guard and completed functional mobility back t bedside recliner using RW with verbal cues to take his time and not to rush to prevent risk of falling or placing weight though R LE.  Patient voicing understanding and left in bedside chair with all needs within reach.  Patient continues to makes progress toward OT goals.   Recommendations for follow up therapy are one component of a multi-disciplinary discharge planning process, led by the attending physician.  Recommendations may be updated based on patient status, additional functional criteria and insurance authorization.    Assistance Recommended at Discharge PRN  Patient can return home with the following  A little help with  bathing/dressing/bathroom;Assist for transportation;Help with stairs or ramp for entrance   Equipment Recommendations  Tub/shower bench;Wheelchair (measurements OT)    Recommendations for Other Services Rehab consult    Precautions / Restrictions Precautions Precautions: Fall Required Braces or Orthoses: Splint/Cast Splint/Cast: R foot/ankle Restrictions Weight Bearing Restrictions: Yes RLE Weight Bearing: Non weight bearing       Mobility Bed Mobility               General bed mobility comments: Pt seated in recliner on arrival this    Transfers Overall transfer level: Needs assistance Equipment used: Rolling walker (2 wheels) Transfers: Sit to/from Stand Sit to Stand: Min guard     Step pivot transfers: Min guard           Balance Overall balance assessment: Needs assistance Sitting-balance support: Feet supported Sitting balance-Leahy Scale: Good                                     ADL either performed or assessed with clinical judgement   ADL Overall ADL's : Needs assistance/impaired Eating/Feeding: Independent;Sitting   Grooming: Wash/dry face;Wash/dry hands;Supervision/safety;Sitting (seated in chair in front of sink to shave face)                   Toilet Transfer: Rolling walker (2 wheels);Min guard           Functional mobility during ADLs: Min guard;Rolling walker (2 wheels)      Extremity/Trunk Assessment Upper Extremity Assessment Upper Extremity Assessment: Overall WFL for tasks assessed  Vision   Vision Assessment?: No apparent visual deficits   Perception Perception Perception: Within Functional Limits   Praxis Praxis Praxis: Intact    Cognition Arousal/Alertness: Awake/alert Behavior During Therapy: WFL for tasks assessed/performed Overall Cognitive Status: Within Functional Limits for tasks assessed                                          Exercises       Shoulder Instructions       General Comments      Pertinent Vitals/ Pain       Pain Assessment Pain Assessment: 0-10 Pain Score: 5  Faces Pain Scale: Hurts even more Pain Location: right ankle and low back Pain Descriptors / Indicators: Discomfort, Grimacing, Guarding, Throbbing Pain Intervention(s): Limited activity within patient's tolerance  Home Living                                          Prior Functioning/Environment              Frequency  Min 1X/week        Progress Toward Goals  OT Goals(current goals can now be found in the care plan section)  Progress towards OT goals: Progressing toward goals  Acute Rehab OT Goals OT Goal Formulation: With patient Time For Goal Achievement: 01/16/23 Potential to Achieve Goals: Good ADL Goals Pt Will Perform Grooming: with modified independence;standing Pt Will Perform Lower Body Bathing: with supervision;sit to/from stand Pt Will Perform Lower Body Dressing: sit to/from stand;with modified independence;with adaptive equipment Pt Will Transfer to Toilet: with modified independence;ambulating;bedside commode Pt Will Perform Toileting - Clothing Manipulation and hygiene: with modified independence;sit to/from stand  Plan Discharge plan remains appropriate;Frequency remains appropriate    Co-evaluation                 AM-PAC OT "6 Clicks" Daily Activity     Outcome Measure   Help from another person eating meals?: None Help from another person taking care of personal grooming?: A Little Help from another person toileting, which includes using toliet, bedpan, or urinal?: A Little Help from another person bathing (including washing, rinsing, drying)?: A Little Help from another person to put on and taking off regular upper body clothing?: A Little Help from another person to put on and taking off regular lower body clothing?: A Little 6 Click Score: 19    End of Session Equipment  Utilized During Treatment: Gait belt;Rolling walker (2 wheels)  OT Visit Diagnosis: Unsteadiness on feet (R26.81);Muscle weakness (generalized) (M62.81);Pain Pain - Right/Left: Right Pain - part of body: Ankle and joints of foot   Activity Tolerance Patient tolerated treatment well   Patient Left in bed;with call bell/phone within reach;with family/visitor present   Nurse Communication Mobility status        Time: 4098-1191 OT Time Calculation (min): 20 min  Charges: OT General Charges $OT Visit: 1 Visit OT Treatments $Self Care/Home Management : 8-22 mins Governor Specking OT/L Denice Paradise 01/05/2023, 4:07 PM

## 2023-01-05 NOTE — Progress Notes (Signed)
Lower extremity venous duplex completed. Please see CV Procedures for preliminary results.  Shona Simpson, RVT 01/05/23 10:59 AM

## 2023-01-05 NOTE — Progress Notes (Signed)
Physical Therapy Treatment Patient Details Name: Thomas [name] MRN: [id number] DOB: 05-Oct-1975 Today's Date: 01/05/2023   History of Present Illness Patient is a 47 y/o male admitted 12/25/22 due to upper GI bleed now s/p EGD on 7/19 with banding of esophageal varices.  Patient also with recent R ankle bimalleolar fx and underwent ORIF on 12/28/22.  PMH positive for obesity, sleep apnea, psoriatic arthritis, ETOH related cirrhosis, portal hypertensive gastropathy, esophageal and gastric varices.    PT Comments  Pt seated in recliner but he is eager to work.  Focused on exercises and short bout of gt training.  Pt continues to fatigue quickly.  He remains unable to negotiate stairs to enter his home.  Will continue exercise program and simulating stair training until he is able to hop high enough to perform stair training.  Pt remains motivated and will be an excellent candidate for aggressive therapies in post acute setting to improve strength and function for entry into home up stairs .     If plan is discharge home, recommend the following: A little help with walking and/or transfers;Assistance with cooking/housework;Assist for transportation;Help with stairs or ramp for entrance   Can travel by private vehicle        Equipment Recommendations  Rolling walker (2 wheels);Hospital bed;BSC/3in1    Recommendations for Other Services       Precautions / Restrictions Precautions Precautions: Fall Required Braces or Orthoses: Splint/Cast Splint/Cast: R foot/ankle Restrictions Weight Bearing Restrictions: Yes RLE Weight Bearing: Non weight bearing     Mobility  Bed Mobility               General bed mobility comments: Pt seated in recliner on arrival this    Transfers Overall transfer level: Needs assistance Equipment used: Rolling walker (2 wheels) Transfers: Sit to/from Stand Sit to Stand: Min guard           General transfer comment: Cues for hand placement to and from  seated surface.  Presents with poor eccentric load.    Ambulation/Gait Ambulation/Gait assistance: Min guard Gait Distance (Feet): 40 Feet (with x1 standing break this session due to fatigue.) Assistive device: Rolling walker (2 wheels) Gait Pattern/deviations: Step-to pattern, Trunk flexed Gait velocity: Decreased velocity     General Gait Details: Cues for sequencing and safety this session.  He fatigues quickly with mobility.  Endurance remains to limit distance.   Stairs Stairs: Yes (remains unable but practiced increasing hop height x 5 reps to prepare for stair training.  Pt able to hop about 3 inch height and needs to be able to clear a 6 inch step.)           Wheelchair Mobility     Tilt Bed    Modified Rankin (Stroke Patients Only)       Balance Overall balance assessment: Needs assistance Sitting-balance support: Feet supported Sitting balance-Leahy Scale: Good     Standing balance support: Bilateral upper extremity supported, During functional activity, Reliant on assistive device for balance Standing balance-Leahy Scale: Poor                              Cognition Arousal/Alertness: Awake/alert Behavior During Therapy: WFL for tasks assessed/performed Overall Cognitive Status: Within Functional Limits for tasks assessed  Exercises Total Joint Exercises Knee Flexion: AROM, Right, 10 reps, Standing Standing Hip Extension: AROM, Right, 10 reps, Standing General Exercises - Lower Extremity Quad Sets: AROM, Both, 10 reps, Supine Long Arc Quad: AROM, Both, 10 reps, Seated Hip ABduction/ADduction: AROM, Both, 10 reps, Supine Hip Flexion/Marching: AROM, Right, 10 reps, Standing Heel Raises: AROM, Left, 10 reps, Standing Mini-Sqauts: AROM, Left, 10 reps, Standing    General Comments        Pertinent Vitals/Pain Pain Assessment Pain Assessment: Faces Faces Pain Scale: Hurts even  more Pain Location: right ankle and low back Pain Descriptors / Indicators: Discomfort, Grimacing, Guarding, Throbbing Pain Intervention(s): Monitored during session, Repositioned    Home Living                          Prior Function            PT Goals (current goals can now be found in the care plan section) Acute Rehab PT Goals Patient Stated Goal: to return home and mobilize safely Potential to Achieve Goals: Good Additional Goals Additional Goal #1: Patient to propel w/c x 100' with S. Progress towards PT goals: Progressing toward goals    Frequency    Min 3X/week      PT Plan Current plan remains appropriate    Co-evaluation              AM-PAC PT "6 Clicks" Mobility   Outcome Measure  Help needed turning from your back to your side while in a flat bed without using bedrails?: None Help needed moving from lying on your back to sitting on the side of a flat bed without using bedrails?: None Help needed moving to and from a bed to a chair (including a wheelchair)?: A Little Help needed standing up from a chair using your arms (e.g., wheelchair or bedside chair)?: A Little Help needed to walk in hospital room?: A Little Help needed climbing 3-5 steps with a railing? : Total 6 Click Score: 18    End of Session Equipment Utilized During Treatment: Gait belt Activity Tolerance: Patient tolerated treatment well Patient left: with call bell/phone within reach;with family/visitor present;in chair Nurse Communication: Mobility status PT Visit Diagnosis: Other abnormalities of gait and mobility (R26.89);Difficulty in walking, not elsewhere classified (R26.2);Pain Pain - Right/Left: Right Pain - part of body: Ankle and joints of foot     Time: 4098-1191 PT Time Calculation (min) (ACUTE ONLY): 14 min  Charges:    $Therapeutic Activity: 8-22 mins PT General Charges $$ ACUTE PT VISIT: 1 Visit                     Bonney Leitz , PTA Acute  Rehabilitation Services Office [phone]     Artis Delay 01/05/2023, 3:00 PM

## 2023-01-05 NOTE — PMR Pre-admission (Signed)
PMR Admission Coordinator Pre-Admission Assessment  Patient: Thomas [name] is an 47 y.o., male MRN: [id number] DOB: 30-May-1976 Height: 5\' 5"  (165.1 cm) Weight: 129.3 kg  Insurance Information HMO:     PPO: Yes     PCP:       IPA:       80/20:       OTHER: Group [id number] PRIMARY: Thomas [name] of [place]      Policy#: [id number]      Subscriber: patient CM Name:       Phone#: [phone]     Fax#: [phone] Pre-Cert#: [id number] Approved 7/27-01/09/23      Employer: [employer] Benefits:  Phone #: [phone]     Name: online at [email or web address] Eff. Date: 06/09/22     Deduct: $1600 (met $1600)      Out of Pocket Max: $2600 (met $2600)      Life Max: n/a CIR: 90%      SNF: 90% Outpatient: 90%     Co-Pay: 10% Home Health: 90%      Co-Pay: 10% DME: 90%     Co-Pay: 10% Providers: in network  SECONDARY:       Policy#:      Phone#:   Artist:       Phone#:   The Data processing manager" for patients in Inpatient Rehabilitation Facilities with attached "Privacy Act Statement-Health Care Records" was provided and verbally reviewed with: N/A  Emergency Contact Information Contact Information     Name Relation Home Work Mobile   Thomas [name] Father   [phone]      Other Contacts     Name Relation Home Work Mobile   Thomas [name],Thomas [name] Relative   [phone]   Thomas [name]   [phone]       Current Medical History  Patient Admitting Diagnosis: Right ankle fracture, UGI bleed  History of Present Illness: A 47 year old male with history of cirrhosis, esophageal varices, alcohol abuse, obesity, obstructive sleep apnea, psoriatic arthritis, anemia presents to the [facility] emergency department with complaints of nausea, vomiting, hematemesis. Patient had a recent ER visit for right foot fracture reduced under sedation, since then has been experiencing nausea, vomiting. GI is consulted, underwent EGD on 12/26/2022, found to have 3 large  columns of nonbleeding esophageal varices s/p 4 band ligations in addition to a large clot/altered body in the fundus of the stomach, underwent suction, irrigation also found to have portal hypertensive gastropathy. GI is recommended to continue with the Protonix, Rocephin for 5 days. PT/OT evaluations completed with recommendations for acute inpatient rehab admission.  Patient's medical record from [facility] has been reviewed by the rehabilitation admission coordinator and physician.  Past Medical History  Past Medical History:  Diagnosis Date   Acute conjunctivitis of both eyes 10/22/2021   Acute sinusitis 06/16/2013   Alcoholic fatty liver 01/16/2010   Needs final HBV and HAV vaccines on or after 10/25/2012    Alcoholism (HCC) 12/25/2011   Allergic rhinitis    Childhood asthma    Elevated transaminase level 06/10/2007   AST: 80 ALT: 136 in 8/11: Hepatitis A., B and C negative.    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    Sleep apnea     Has the patient had major surgery during 100 days prior to admission? Yes  Family History  family history includes Depression in his mother; Diabetes in his father and another family member; Hypertension in his father; Liver disease in his father, mother, and sister.  Current Medications  Current Facility-Administered Medications:    0.9 %  sodium chloride infusion (Manually program via Guardrails IV Fluids), , Intravenous, Once, Brown, Blaine K, PA-C   0.9 %  sodium chloride infusion (Manually program via Guardrails IV Fluids), , Intravenous, Once, Brown, Blaine K, PA-C   0.9 %  sodium chloride infusion (Manually program via Guardrails IV Fluids), , Intravenous, Once, Brown, Blaine K, PA-C   0.9 %  sodium chloride infusion (Manually program via Guardrails IV Fluids), ,  Intravenous, Once, Narda Bonds, MD   acetaminophen (TYLENOL) tablet 650 mg, 650 mg, Oral, Q4H PRN, 650 mg at 01/04/23 2141 **OR** acetaminophen (TYLENOL) suppository 650 mg, 650 mg, Rectal, Q6H PRN, Manson Passey, Blaine K, PA-C   alum & mag hydroxide-simeth (MAALOX/MYLANTA) 200-200-20 MG/5ML suspension 30 mL, 30 mL, Oral, Q4H PRN, Narda Bonds, MD, 30 mL at 01/02/23 0021   furosemide (LASIX) tablet 40 mg, 40 mg, Oral, BID, Vasireddy, Clerance Lav, MD, 40 mg at 01/05/23 0947   HYDROmorphone (DILAUDID) injection 0.5 mg, 0.5 mg, Intravenous, Q4H PRN, Manson Passey, Blaine K, PA-C   ondansetron [facility]) injection 4 mg, 4 mg, Intravenous, Q6H PRN, Manson Passey, Blaine K, PA-C   oxyCODONE (Oxy IR/ROXICODONE) immediate release tablet 5-10 mg, 5-10 mg, Oral, Q4H PRN, Manson Passey, Blaine K, PA-C, 5 mg at 01/03/23 2238   pantoprazole (PROTONIX) EC tablet 40 mg, 40 mg, Oral, BID, Pham, Minh Q, RPH-CPP, 40 mg at 01/05/23 0948   propranolol (INDERAL) tablet 20 mg, 20 mg, Oral, BID, Narda Bonds, MD, 20 mg at 01/05/23 0948   sodium chloride flush (NS) 0.9 % injection 3 mL, 3 mL, Intravenous, Q12H, Brown, Blaine K, PA-C, 3 mL at 01/05/23 0949  Patients Current Diet:  Diet Order             Diet - low sodium heart healthy           Diet - low sodium heart healthy           DIET SOFT Room service appropriate? Yes with Assist; Fluid consistency: Thin  Diet effective now                   Precautions / Restrictions Precautions Precautions: Fall Restrictions Weight Bearing Restrictions: Yes RLE Weight Bearing: Non weight bearing   Has the patient had 2 or more falls or a fall with injury in the past year? Yes  Prior Activity Level Community (5-7x/wk): Went out most days, was driving.  Prior Functional Level Self Care: Did the patient need help bathing, dressing, using the toilet or eating? Independent  Indoor Mobility: Did the patient need assistance with walking from room to room (with or without device)?  Independent  Stairs: Did the patient need assistance with internal or external stairs (with or without device)? Independent  Functional Cognition: Did the patient need help planning regular tasks such as shopping or remembering to take medications? Independent  Patient Information Are you of Hispanic, Latino/a,or Spanish origin?: E. Yes, another Hispanic, Latino, or Spanish origin What is your race?: A. White Do you need or want an interpreter to communicate with a doctor or health care staff?: 0. No  Patient's Response To:  Health Literacy and Transportation Is the patient able to respond to health literacy and transportation needs?: Yes Health Literacy - How often do you need to  have someone help you when you read instructions, pamphlets, or other written material from your doctor or pharmacy?: Never In the past 12 months, has lack of transportation kept you from medical appointments or from getting medications?: No In the past 12 months, has lack of transportation kept you from meetings, work, or from getting things needed for daily living?: No  Journalist, newspaper / Equipment Home Assistive Devices/Equipment: None Home Equipment: Crutches, Wheelchair - manual  Prior Device Use: Indicate devices/aids used by the patient prior to current illness, exacerbation or injury? None of the above  Current Functional Level Cognition  Overall Cognitive Status: Within Functional Limits for tasks assessed Orientation Level: Oriented X4    Extremity Assessment (includes Sensation/Coordination)  Upper Extremity Assessment: Overall WFL for tasks assessed  Lower Extremity Assessment: Defer to PT evaluation RLE Deficits / Details: lifts leg antigravity, wiggles toes and can feel touch to toes, noted cast on ankle/foot    ADLs  Overall ADL's : Needs assistance/impaired Eating/Feeding: Independent, Sitting Eating/Feeding Details (indicate cue type and reason): simulated Grooming: Standing,  Min guard Grooming Details (indicate cue type and reason): Educated pt on the use of hip postioning and trunk propping to support weight in standing against surfaces and reducing physical exertion of LLE. Upper Body Bathing: Set up, Sitting Lower Body Bathing: Minimal assistance, Sit to/from stand Lower Body Bathing Details (indicate cue type and reason): slight difficulty reaching his left foot sitting in the recliner Upper Body Dressing : Set up, Sitting Lower Body Dressing: Sitting/lateral leans, Supervision/safety Lower Body Dressing Details (indicate cue type and reason): don socks with supervision using bed to assist with propping LLE. Toilet Transfer: Agricultural consultant (2 wheels), Hydrographic surveyor Details (indicate cue type and reason): simulated, pt declined need to toilet Toileting- Clothing Manipulation and Hygiene: Min guard, Sit to/from stand Functional mobility during ADLs: Min guard, Rolling walker (2 wheels) General ADL Comments: Educated pt on the use of UE support to complete dynamic reaching for objects on the floor, pt successfully completed activity but provided with min cues for safety and optimal RW position to maintain BOS. He did elicit pain in hip flexors after movement, reinforced hip flexor stretches. Also discussed with pt getting a reacher if desired.    Mobility  Overal bed mobility: Modified Independent General bed mobility comments: Increased time and effort to move to edge of bed and back to bed but no assistance needed,    Transfers  Overall transfer level: Needs assistance Equipment used: Rolling walker (2 wheels) Transfers: Sit to/from Stand Sit to Stand: Min guard Bed to/from chair/wheelchair/BSC transfer type:: Step pivot Step pivot transfers: Min guard General transfer comment: Pt able to adhere to weightbearing restrictions throughout sit to stand and stand step transfers.    Ambulation / Gait / Stairs / Wheelchair Mobility   Ambulation/Gait Ambulation/Gait assistance: Editor, commissioning (Feet): 30 Feet (+ 10 ft) Assistive device: Rolling walker (2 wheels) (bariatric) Gait Pattern/deviations: Step-to pattern, Trunk flexed General Gait Details: Cues for sequencing and safety this session.  He fatigues quickly with mobility. Gait velocity: Decreased velocity Gait velocity interpretation: <1.31 ft/sec, indicative of household ambulator Stairs: Yes Stairs assistance: Total assist Stair Management: Backwards, With walker Number of Stairs: 0 General stair comments: Pt was unable to clear one step and was unable to sit on step to perform stairs per home set up with Max A from this physical therapist.    Posture / Balance Balance Overall balance assessment: Needs assistance Sitting-balance support:  Feet supported Sitting balance-Leahy Scale: Good Standing balance support: Bilateral upper extremity supported, During functional activity, Reliant on assistive device for balance Standing balance-Leahy Scale: Poor Standing balance comment: Pt needs UE support    Special needs/care consideration Skin Has a cast with ace wrap on right lower extremity ankle fracture area and Special service needs none   Previous Home Environment (from acute therapy documentation) Living Arrangements: Parent (dad lives with him) Available Help at Discharge: Family Type of Home: House Home Layout: Multi-level Alternate Level Stairs-Number of Steps: half bath on first floor no shower, was sleeping in recliner; shower is up 9-10 steps then landing then 6-7 steps to bathrooms Home Access: Stairs to enter [employer] of Steps: can enter from garage 3 steps no rail, main entrance 3 brick steps with porch, has metal rail on outside of platform with wooden post holding up awning Bathroom Shower/Tub: Tub/shower unit, Secretary/administrator is walk in shower, tub shower is in hallway) Firefighter: Standard Home Care Services:  No Additional Comments: tub shower in the basement  Discharge Living Setting Plans for Discharge Living Setting: House, Lives with (comment) (Lives with dad.) Type of Home at Discharge: House Discharge Home Layout: Multi-level, 1/2 bath on main level (three level home.) Alternate Level Stairs-Rails: Left (Rail on left going down to basement.) Alternate Level Stairs-Number of Steps: 15-17 steps Discharge Home Access: Stairs to enter Entrance Stairs-Rails: None Entrance Stairs-Number of Steps: 4 steps Discharge Bathroom Shower/Tub: Tub/shower unit, Curtain Discharge Bathroom Toilet: Standard Discharge Bathroom Accessibility: No (unsure if door is wide enough for walker.) Does the patient have any problems obtaining your medications?: No  Social/Family/Support Systems Patient Roles: Other (Comment) (Has Dad.) Contact Information: Lemmie [name] - father - [phone] Anticipated Caregiver: Thomas [name] - father - Anticipated Caregiver's Contact Information: Dad [phone] Ability/Limitations of Caregiver: Dad lives with patient on 3rd level.  Dad is retired and can provide supervision. Caregiver Availability: 24/7 Discharge Plan Discussed with Primary Caregiver: Yes Is Caregiver In Agreement with Plan?: Yes Does Caregiver/Family have Issues with Lodging/Transportation while Pt is in Rehab?: No  Goals Patient/Family Goal for Rehab: PT/OT supervision goals Expected length of stay: 7-10 days Cultural Considerations: Latino but speaks Englsih well. Pt/Family Agrees to Admission and willing to participate: Yes Program Orientation Provided & Reviewed with Pt/Caregiver Including Roles  & Responsibilities: Yes  Decrease burden of Care through IP rehab admission: N/A  Possible need for SNF placement upon discharge: Not planned  Patient Condition: I have reviewed medical records from [facility], spoken with CM, and patient. I met with patient at the bedside for inpatient  rehabilitation assessment.  Patient will benefit from ongoing PT and OT, can actively participate in 3 hours of therapy a day 5 days of the week, and can make measurable gains during the admission.  Patient will also benefit from the coordinated team approach during an Inpatient Acute Rehabilitation admission.  The patient will receive intensive therapy as well as Rehabilitation physician, nursing, social worker, and care management interventions.  Due to bladder management, bowel management, safety, skin/wound care, disease management, medication administration, pain management, and patient education the patient requires 24 hour a day rehabilitation nursing.  The patient is currently min guard with mobility and basic ADLs.  Discharge setting and therapy post discharge at home with home health is anticipated.  Patient has agreed to participate in the Acute Inpatient Rehabilitation Program and will admit today.  Preadmission Screen Completed By:  Trish Mage, 01/05/2023 11:36  AM ______________________________________________________________________   Discussed status with Dr. Natale Lay on 01/07/23 at 930 and received approval for admission today.  Admission Coordinator:  Trish Mage, RN, time 1200 Dorna Bloom 01/07/23   Assessment/Plan: Diagnosis: UGIB and right ankle fracture  Does the need for close, 24 hr/day Medical supervision in concert with the patient's rehab needs make it unreasonable for this patient to be served in a less intensive setting? Yes Co-Morbidities requiring supervision/potential complications: Cirrhosis, Psoriatic arthritis, ABLA, obesity, Esophageal varices, OSA, Alcoholism Due to bladder management, bowel management, safety, skin/wound care, disease management, medication administration, pain management, and patient education, does the patient require 24 hr/day rehab nursing? Yes Does the patient require coordinated care of a physician, rehab nurse, PT, OT, and SLP to address  physical and functional deficits in the context of the above medical diagnosis(es)? Yes Addressing deficits in the following areas: balance, endurance, locomotion, strength, transferring, bowel/bladder control, bathing, dressing, feeding, grooming, toileting, and psychosocial support Can the patient actively participate in an intensive therapy program of at least 3 hrs of therapy 5 days a week? Yes The potential for patient to make measurable gains while on inpatient rehab is excellent Anticipated functional outcomes upon discharge from inpatient rehab: supervision PT, supervision OT, n/a SLP Estimated rehab length of stay to reach the above functional goals is: 7-10 Anticipated discharge destination: Home 10. Overall Rehab/Functional Prognosis: excellent   MD Signature: Fanny Dance

## 2023-01-06 DIAGNOSIS — D696 Thrombocytopenia, unspecified: Secondary | ICD-10-CM | POA: Insufficient documentation

## 2023-01-06 DIAGNOSIS — I8511 Secondary esophageal varices with bleeding: Secondary | ICD-10-CM | POA: Diagnosis not present

## 2023-01-06 DIAGNOSIS — D6959 Other secondary thrombocytopenia: Secondary | ICD-10-CM

## 2023-01-06 DIAGNOSIS — S82891A Other fracture of right lower leg, initial encounter for closed fracture: Secondary | ICD-10-CM | POA: Insufficient documentation

## 2023-01-06 MED ORDER — FUROSEMIDE 40 MG PO TABS: 40.0000 mg | ORAL_TABLET | Freq: Two times a day (BID) | ORAL | Status: DC

## 2023-01-06 NOTE — Assessment & Plan Note (Signed)
Hgb stable at 7.4.  Microcytic - Start iron - Repeat CBC in 1 week

## 2023-01-06 NOTE — Progress Notes (Signed)
Physical Therapy Treatment Patient Details Name: Thomas [name] MRN: [id number] DOB: 12-24-1975 Today's Date: 01/06/2023   History of Present Illness Patient is a 47 y/o male admitted 12/25/22 due to upper GI bleed now s/p EGD on 7/19 with banding of esophageal varices.  Patient also with recent R ankle bimalleolar fx and underwent ORIF on 12/28/22.  PMH positive for obesity, sleep apnea, psoriatic arthritis, ETOH related cirrhosis, portal hypertensive gastropathy, esophageal and gastric varices.    PT Comments  Pt tolerated treatment well today. Pt was able to progress ambulation in hallway with RW Min G however continues to be limited by decreased endurance requiring 3 rest breaks. No change in DC/DME recs at this time. PT will continue to follow.    If plan is discharge home, recommend the following: A little help with walking and/or transfers;Assistance with cooking/housework;Assist for transportation;Help with stairs or ramp for entrance   Can travel by private vehicle        Equipment Recommendations  Rolling walker (2 wheels);Hospital bed;BSC/3in1    Recommendations for Other Services       Precautions / Restrictions Precautions Precautions: Fall Required Braces or Orthoses: Splint/Cast Splint/Cast: R foot/ankle Restrictions Weight Bearing Restrictions: Yes RLE Weight Bearing: Non weight bearing     Mobility  Bed Mobility               General bed mobility comments: Pt seated in recliner on arrival    Transfers Overall transfer level: Needs assistance Equipment used: Rolling walker (2 wheels) Transfers: Sit to/from Stand Sit to Stand: Min guard                Ambulation/Gait Ambulation/Gait assistance: Min guard Gait Distance (Feet): 75 Feet Assistive device: Rolling walker (2 wheels) Gait Pattern/deviations: Step-to pattern, Trunk flexed Gait velocity: Decreased velocity     General Gait Details: Cues for proximity to RW and safety this session.   He fatigues quickly with mobility.  Endurance remains to limit distance. 2 standing rest breaks and 1 seated rest break (Similar presentation to previous sessions)   Stairs             Wheelchair Mobility     Tilt Bed    Modified Rankin (Stroke Patients Only)       Balance Overall balance assessment: Needs assistance Sitting-balance support: Feet supported Sitting balance-Leahy Scale: Good     Standing balance support: Bilateral upper extremity supported, During functional activity, Reliant on assistive device for balance Standing balance-Leahy Scale: Poor Standing balance comment: Pt needs UE support                            Cognition Arousal/Alertness: Awake/alert Behavior During Therapy: WFL for tasks assessed/performed Overall Cognitive Status: Within Functional Limits for tasks assessed                                          Exercises      General Comments General comments (skin integrity, edema, etc.): VSS      Pertinent Vitals/Pain Pain Assessment Pain Assessment: No/denies pain    Home Living                          Prior Function            PT Goals (current goals can now  be found in the care plan section) Progress towards PT goals: Progressing toward goals    Frequency    Min 3X/week      PT Plan Current plan remains appropriate    Co-evaluation              AM-PAC PT "6 Clicks" Mobility   Outcome Measure  Help needed turning from your back to your side while in a flat bed without using bedrails?: None Help needed moving from lying on your back to sitting on the side of a flat bed without using bedrails?: None Help needed moving to and from a bed to a chair (including a wheelchair)?: A Little Help needed standing up from a chair using your arms (e.g., wheelchair or bedside chair)?: A Little Help needed to walk in hospital room?: A Little Help needed climbing 3-5 steps with a  railing? : Total 6 Click Score: 18    End of Session Equipment Utilized During Treatment: Gait belt Activity Tolerance: Patient tolerated treatment well Patient left: with call bell/phone within reach;with family/visitor present;in chair Nurse Communication: Mobility status PT Visit Diagnosis: Other abnormalities of gait and mobility (R26.89);Difficulty in walking, not elsewhere classified (R26.2);Pain     Time: 3875-6433 PT Time Calculation (min) (ACUTE ONLY): 17 min  Charges:    $Gait Training: 8-22 mins PT General Charges $$ ACUTE PT VISIT: 1 Visit                    Shela Nevin, PT, DPT Acute Rehab Services [phone]    Gladys Damme 01/06/2023, 3:56 PM

## 2023-01-06 NOTE — Assessment & Plan Note (Signed)
CPAP.  

## 2023-01-06 NOTE — Assessment & Plan Note (Signed)
BMI 47 

## 2023-01-06 NOTE — Assessment & Plan Note (Signed)
No further bleeding - Continue PPI twice daily - Soft diet for 2 weeks - Continue propranolol - Follow-up with [facility] for repeat EGD in 3 to 4 weeks for surveillance of esophageal varices

## 2023-01-06 NOTE — Assessment & Plan Note (Signed)
Not on disease specific therapy

## 2023-01-06 NOTE — Assessment & Plan Note (Signed)
-   NWB RLE - Outpatient follow up - Avoid DVT PPx given variceal bleeding

## 2023-01-06 NOTE — Assessment & Plan Note (Signed)
Recommend alcohol cessation. 

## 2023-01-06 NOTE — Assessment & Plan Note (Signed)
-   Alcohol cessation - Follow up with [facility] - New propranolol

## 2023-01-06 NOTE — Progress Notes (Signed)
  Progress Note   Patient: Thomas [name] VQQ:[id number] DOB: August 14, 1975 DOA: 12/25/2022     12 DOS: the patient was seen and examined on 01/06/2023 at 8:30 AM      Brief hospital course: Mr. Yepes is a 47 y.o. M with hx cirrhosis, obesity, psoriatic arthritis who presented with UGIB and right ankle fracture.      Significant events: 7/15: Seen in ER for ankle fracture, reduced and discharged home  7/18: Returned with hematemesis, admitted on PPI drip 7/19: GI and Ortho consulted; underwent EGD that showed 3 columns of large varices, with stigmata of recent bleeding, four bands placed; portal gastropathy noted 7/21: ORIF of right ankle by Ortho    Significant studies: 7/29 bilateral doppler US -- negative for DVT   Significant microbiology data: 7/18 COVID negative 7/20 MRSA nares negative   Procedures: 7/19: EGD by Dr. Lavon Paganini 7/21: ORIF bimalleolar ankle fracture with fixation of medial and lateral; Open reduction internal fixation right syndesmosis by Dr. Dion Saucier    Consults: GI Orthopedics      Assessment and Plan: * Secondary esophageal varices with bleeding (HCC) No further bleeding - Continue PPI twice daily - Soft diet for 2 weeks - Continue propranolol - Follow-up with [facility] for repeat EGD in 3 to 4 weeks for surveillance of esophageal varices    Thrombocytopenia (HCC) Due to cirrhosis  Closed right ankle fracture - NWB RLE - Outpatient follow up - Avoid DVT PPx given variceal bleeding  Cirrhosis of liver (HCC) No HE.  Varices as above - Continue Lasix and propranolol - GI follow up  - Alcohol cessation  Psoriatic arthritis (HCC) Not on disease specific therapy  Acute blood loss anemia Hgb had trended down to 7.2 - Repeat H/H  Class 3 severe obesity due to excess calories with body mass index (BMI) of 50.0 to 59.9 in adult (HCC) BMI 47  Esophageal varices in alcoholic cirrhosis (HCC) - Alcohol cessation - Follow up with  [facility]  OSA (obstructive sleep apnea) - CPAP  Alcoholism (HCC) - Recommend alcohol cessation          Subjective: Patient is feeling well.  He said no melena or vomiting.  No new concerns.  Awaiting SNF bed.     Physical Exam: BP 131/62 (BP Location: Left Arm)   Pulse 65   Temp 98.2 F (36.8 C) (Oral)   Resp 18   Ht 5\' 5"  (1.651 m)   Wt 129.3 kg   SpO2 100%   BMI 47.43 kg/m   Obese adult male, lying in bed, interactive and appropriate RRR, no murmurs, no peripheral edema Respiratory normal, lungs clear without rales or wheezes Abdomen soft no tenderness palpation or guarding Attention normal, affect appropriate, judgment insight appear normal    Data Reviewed: CBC from 3 days ago reviewed, hemoglobin trending down  Family Communication: None present    Disposition: Status is: Inpatient Patient is medically stable for discharge, but unable to safely return home due to an inability to bear weight on the right leg, and inadequate support at home.  We are awaiting authorization from SNF for rehabilitation        Author: Alberteen Sam, MD 01/06/2023 3:32 PM  For on call review [email or web address].

## 2023-01-06 NOTE — Assessment & Plan Note (Signed)
Due to cirrhosis °

## 2023-01-06 NOTE — Assessment & Plan Note (Signed)
No HE.  Varices as above - Continue Lasix and propranolol - GI follow up  - Alcohol cessation

## 2023-01-07 ENCOUNTER — Encounter (HOSPITAL_COMMUNITY): Payer: Self-pay | Admitting: Physical Medicine & Rehabilitation

## 2023-01-07 ENCOUNTER — Other Ambulatory Visit: Payer: Self-pay

## 2023-01-07 ENCOUNTER — Inpatient Hospital Stay (HOSPITAL_COMMUNITY)
Admission: RE | Admit: 2023-01-07 | Discharge: 2023-01-12 | DRG: 559 | Disposition: A | Discharge status: Home / Self Care | Payer: BC Managed Care – PPO | Source: Intra-hospital | Attending: Physical Medicine & Rehabilitation | Admitting: Physical Medicine & Rehabilitation

## 2023-01-07 DIAGNOSIS — Z6841 Body Mass Index (BMI) 40.0 and over, adult: Secondary | ICD-10-CM

## 2023-01-07 DIAGNOSIS — K5900 Constipation, unspecified: Secondary | ICD-10-CM | POA: Diagnosis not present

## 2023-01-07 DIAGNOSIS — D62 Acute posthemorrhagic anemia: Secondary | ICD-10-CM | POA: Diagnosis present

## 2023-01-07 DIAGNOSIS — I8511 Secondary esophageal varices with bleeding: Secondary | ICD-10-CM | POA: Diagnosis present

## 2023-01-07 DIAGNOSIS — X58XXXD Exposure to other specified factors, subsequent encounter: Secondary | ICD-10-CM | POA: Diagnosis present

## 2023-01-07 DIAGNOSIS — F17290 Nicotine dependence, other tobacco product, uncomplicated: Secondary | ICD-10-CM | POA: Diagnosis not present

## 2023-01-07 DIAGNOSIS — R600 Localized edema: Secondary | ICD-10-CM

## 2023-01-07 DIAGNOSIS — I959 Hypotension, unspecified: Secondary | ICD-10-CM | POA: Diagnosis not present

## 2023-01-07 DIAGNOSIS — F1020 Alcohol dependence, uncomplicated: Secondary | ICD-10-CM | POA: Diagnosis not present

## 2023-01-07 DIAGNOSIS — K7030 Alcoholic cirrhosis of liver without ascites: Secondary | ICD-10-CM | POA: Diagnosis not present

## 2023-01-07 DIAGNOSIS — S82891A Other fracture of right lower leg, initial encounter for closed fracture: Secondary | ICD-10-CM | POA: Diagnosis not present

## 2023-01-07 DIAGNOSIS — L4050 Arthropathic psoriasis, unspecified: Secondary | ICD-10-CM | POA: Diagnosis not present

## 2023-01-07 DIAGNOSIS — Z818 Family history of other mental and behavioral disorders: Secondary | ICD-10-CM

## 2023-01-07 DIAGNOSIS — Z8249 Family history of ischemic heart disease and other diseases of the circulatory system: Secondary | ICD-10-CM | POA: Diagnosis not present

## 2023-01-07 DIAGNOSIS — E46 Unspecified protein-calorie malnutrition: Secondary | ICD-10-CM | POA: Diagnosis not present

## 2023-01-07 DIAGNOSIS — G4733 Obstructive sleep apnea (adult) (pediatric): Secondary | ICD-10-CM | POA: Diagnosis present

## 2023-01-07 DIAGNOSIS — D649 Anemia, unspecified: Secondary | ICD-10-CM | POA: Diagnosis not present

## 2023-01-07 DIAGNOSIS — G4700 Insomnia, unspecified: Secondary | ICD-10-CM | POA: Diagnosis present

## 2023-01-07 DIAGNOSIS — S82841D Displaced bimalleolar fracture of right lower leg, subsequent encounter for closed fracture with routine healing: Principal | ICD-10-CM

## 2023-01-07 DIAGNOSIS — M62838 Other muscle spasm: Secondary | ICD-10-CM | POA: Diagnosis present

## 2023-01-07 DIAGNOSIS — Z713 Dietary counseling and surveillance: Secondary | ICD-10-CM

## 2023-01-07 DIAGNOSIS — D696 Thrombocytopenia, unspecified: Secondary | ICD-10-CM | POA: Diagnosis not present

## 2023-01-07 DIAGNOSIS — Z7401 Bed confinement status: Secondary | ICD-10-CM | POA: Diagnosis not present

## 2023-01-07 DIAGNOSIS — S82401A Unspecified fracture of shaft of right fibula, initial encounter for closed fracture: Secondary | ICD-10-CM | POA: Diagnosis not present

## 2023-01-07 DIAGNOSIS — M545 Low back pain, unspecified: Secondary | ICD-10-CM | POA: Diagnosis not present

## 2023-01-07 DIAGNOSIS — K7460 Unspecified cirrhosis of liver: Secondary | ICD-10-CM | POA: Diagnosis not present

## 2023-01-07 DIAGNOSIS — S82899A Other fracture of unspecified lower leg, initial encounter for closed fracture: Secondary | ICD-10-CM | POA: Diagnosis present

## 2023-01-07 DIAGNOSIS — S82891D Other fracture of right lower leg, subsequent encounter for closed fracture with routine healing: Secondary | ICD-10-CM | POA: Diagnosis not present

## 2023-01-07 DIAGNOSIS — Z833 Family history of diabetes mellitus: Secondary | ICD-10-CM | POA: Diagnosis not present

## 2023-01-07 DIAGNOSIS — R58 Hemorrhage, not elsewhere classified: Secondary | ICD-10-CM | POA: Diagnosis not present

## 2023-01-07 DIAGNOSIS — Z79899 Other long term (current) drug therapy: Secondary | ICD-10-CM

## 2023-01-07 DIAGNOSIS — F1011 Alcohol abuse, in remission: Secondary | ICD-10-CM | POA: Diagnosis not present

## 2023-01-07 DIAGNOSIS — G8929 Other chronic pain: Secondary | ICD-10-CM | POA: Diagnosis not present

## 2023-01-07 DIAGNOSIS — L409 Psoriasis, unspecified: Secondary | ICD-10-CM | POA: Diagnosis not present

## 2023-01-07 DIAGNOSIS — S8251XA Displaced fracture of medial malleolus of right tibia, initial encounter for closed fracture: Secondary | ICD-10-CM | POA: Diagnosis not present

## 2023-01-07 DIAGNOSIS — E559 Vitamin D deficiency, unspecified: Secondary | ICD-10-CM | POA: Diagnosis present

## 2023-01-07 MED ORDER — ALUM & MAG HYDROXIDE-SIMETH 200-200-20 MG/5ML PO SUSP: 30.0000 mL | ORAL | Status: DC | PRN

## 2023-01-07 MED ORDER — FERROUS SULFATE 325 (65 FE) MG PO TABS: 325.0000 mg | ORAL_TABLET | ORAL | Status: DC

## 2023-01-07 MED ORDER — PROPRANOLOL HCL 20 MG PO TABS
20.0000 mg | ORAL_TABLET | Freq: Two times a day (BID) | ORAL | Status: DC
  Administered 2023-01-07 – 2023-01-10 (×7): 20 mg via ORAL
  Filled 2023-01-07 (×8): qty 1

## 2023-01-07 MED ORDER — FERROUS SULFATE 325 (65 FE) MG PO TABS: 325.0000 mg | ORAL_TABLET | ORAL | 3 refills | Status: DC

## 2023-01-07 MED ORDER — TRAMADOL HCL 50 MG PO TABS
50.0000 mg | ORAL_TABLET | Freq: Four times a day (QID) | ORAL | Status: DC | PRN
  Administered 2023-01-08 – 2023-01-09 (×2): 50 mg via ORAL
  Filled 2023-01-07 (×4): qty 1

## 2023-01-07 MED ORDER — FLEET ENEMA 7-19 GM/118ML RE ENEM: 1.0000 | ENEMA | Freq: Once | RECTAL | Status: DC | PRN

## 2023-01-07 MED ORDER — OXYCODONE HCL 5 MG PO TABS
5.0000 mg | ORAL_TABLET | ORAL | Status: DC | PRN
  Administered 2023-01-07: 5 mg via ORAL
  Filled 2023-01-07: qty 1

## 2023-01-07 MED ORDER — FUROSEMIDE 40 MG PO TABS
40.0000 mg | ORAL_TABLET | Freq: Two times a day (BID) | ORAL | Status: DC
  Administered 2023-01-08 – 2023-01-09 (×4): 40 mg via ORAL
  Filled 2023-01-07 (×4): qty 1

## 2023-01-07 MED ORDER — TRAZODONE HCL 50 MG PO TABS: 25.0000 mg | ORAL_TABLET | Freq: Every evening | ORAL | Status: DC | PRN

## 2023-01-07 MED ORDER — PANTOPRAZOLE SODIUM 40 MG PO TBEC
40.0000 mg | DELAYED_RELEASE_TABLET | Freq: Two times a day (BID) | ORAL | Status: DC
  Administered 2023-01-07 – 2023-01-12 (×11): 40 mg via ORAL
  Filled 2023-01-07 (×11): qty 1

## 2023-01-07 MED ORDER — ACETAMINOPHEN 325 MG PO TABS
325.0000 mg | ORAL_TABLET | ORAL | Status: DC | PRN
  Administered 2023-01-07: 325 mg via ORAL
  Administered 2023-01-10 – 2023-01-12 (×5): 650 mg via ORAL
  Filled 2023-01-07 (×3): qty 2
  Filled 2023-01-07: qty 1
  Filled 2023-01-07 (×3): qty 2

## 2023-01-07 MED ORDER — BISACODYL 10 MG RE SUPP: 10.0000 mg | Freq: Every day | RECTAL | Status: DC | PRN

## 2023-01-07 MED ORDER — FERROUS SULFATE 325 (65 FE) MG PO TABS
325.0000 mg | ORAL_TABLET | ORAL | Status: DC
  Administered 2023-01-07: 325 mg via ORAL
  Filled 2023-01-07: qty 1

## 2023-01-07 MED ORDER — GUAIFENESIN-DM 100-10 MG/5ML PO SYRP: 5.0000 mL | ORAL_SOLUTION | Freq: Four times a day (QID) | ORAL | Status: DC | PRN

## 2023-01-07 MED ORDER — DIPHENHYDRAMINE HCL 25 MG PO CAPS: 25.0000 mg | ORAL_CAPSULE | Freq: Four times a day (QID) | ORAL | Status: DC | PRN

## 2023-01-07 MED ORDER — CYCLOBENZAPRINE HCL 5 MG PO TABS
5.0000 mg | ORAL_TABLET | Freq: Three times a day (TID) | ORAL | Status: DC | PRN
  Administered 2023-01-08 – 2023-01-09 (×2): 5 mg via ORAL
  Filled 2023-01-07 (×3): qty 1

## 2023-01-07 NOTE — H&P (Addendum)
Physical Medicine and Rehabilitation Admission H&P    Chief Complaint  Patient presents with   Functional deficits due to ankle fracture, GIB, morbid obesity.     HPI: Thomas [name] is a 47 year old male with history of psoriatic arthritis, alcohol use, OSA, morbid obesity- BMI-47, Liver cirrhosis w/esophageal varices, hematemesis w/melena due to UGIB 1/24, closed bimalleolar ankle Fx 12/22/22 which was reduced and splinted in ED with recommendations for outpatient follow up. He was admitted on 12/25/22 N/V, hematemesis and anemia with hgb 7.0.  He was made NPO,etarted on octreotide,  IV PPI and transfused with one unit PRBC. Dr. Rosalia Hammers consulted and patient reported binging on whiskey 7/4-7/6 and being out of inderal/protonix for a month.  INR 1.5 at admission and he continued to have drop in Hgb on serial H/H checks. He underwent EGD revealing three columns of large esophageal varices with stigmata of recent bleed were eradicated with four bands and large clot from fundus was cleared. Endoscopy. He was kept on octreotide, IV Ceftriaxone X 5 days for SBP prophylaxis and carafate with recommendations to transfuse prn hgb <7.  Diet slowly advanced and octreotide transitioned to propranolol after 72 hours.    Dr. Dion Saucier consulted for input on ankle Fx and patient underwent ORIF right medial and lateral ankle with ORIF right syndesmosis on 07/21. Post op to be NWB  and to follow up with ortho in 2 weeks. H/H noted to be trending down and started on oral supplement today.  Has been on Lasix to manage BLE edema and thrombocytopenia being monitored.  Patient reports chronic lower back pain worsened with recent activities.  Patient said he is trying to use Tylenol primarily for pain control.  Therapy has been working with patient who is limited by weakness, NWB LLE and requires min assist for mobility and ADLs. CIR recommended due to functional decline.     Review of Systems  Constitutional:   Negative for chills, fever and malaise/fatigue.  HENT:  Negative for hearing loss.   Eyes:  Negative for blurred vision and double vision.  Respiratory:  Negative for cough and shortness of breath.   Cardiovascular:  Positive for leg swelling. Negative for chest pain and orthopnea.  Gastrointestinal:  Positive for diarrhea. Negative for constipation, heartburn and nausea.  Genitourinary:  Negative for dysuria and urgency.  Musculoskeletal:  Positive for back pain and myalgias.  Neurological:  Positive for weakness. Negative for dizziness, sensory change and headaches.  Psychiatric/Behavioral:  The patient has insomnia.     Past Medical History:  Diagnosis Date   Acute conjunctivitis of both eyes 10/22/2021   Acute sinusitis 06/16/2013   Alcoholic fatty liver 01/16/2010   Needs final HBV and HAV vaccines on or after 10/25/2012    Alcoholism (HCC) 12/25/2011   Allergic rhinitis    Childhood asthma    Elevated transaminase level 06/10/2007   AST: 80 ALT: 136 in 8/11: Hepatitis A., B and C negative.    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    Sleep apnea     Past Surgical History:  Procedure Laterality Date   ESOPHAGEAL BANDING  12/26/2022   Procedure: ESOPHAGEAL BANDING;  Surgeon: Napoleon Form, MD;  Location: [facility] ENDOSCOPY;  Service: Gastroenterology;;   ESOPHAGOGASTRODUODENOSCOPY (EGD) WITH PROPOFOL N/A 07/05/2022   Procedure: ESOPHAGOGASTRODUODENOSCOPY (EGD) WITH  PROPOFOL;  Surgeon: Napoleon Form, MD;  Location: [facility] ENDOSCOPY;  Service: Gastroenterology;  Laterality: N/A;   ESOPHAGOGASTRODUODENOSCOPY (EGD) WITH PROPOFOL N/A 12/26/2022   Procedure: ESOPHAGOGASTRODUODENOSCOPY (EGD) WITH PROPOFOL;  Surgeon: Napoleon Form, MD;  Location: [facility] ENDOSCOPY;  Service: Gastroenterology;  Laterality: N/A;   ORIF ANKLE  FRACTURE Right 12/28/2022   Procedure: OPEN REDUCTION INTERNAL FIXATION (ORIF) ANKLE FRACTURE;  Surgeon: Teryl Lucy, MD;  Location: [facility] OR;  Service: Orthopedics;  Laterality: Right;    Family History  Problem Relation Age of Onset   Liver disease Mother    Depression Mother    Liver disease Father    Diabetes Father    Hypertension Father    Liver disease Sister    Diabetes Other    Colon cancer Neg Hx    Esophageal cancer Neg Hx    Stomach cancer Neg Hx    Colon polyps Neg Hx     Social History:  reports that he has been smoking cigars. He has never used smokeless tobacco. He reports that he does not currently use alcohol. He reports that he does not use drugs. Allergies:   Allergies  Allergen Reactions   Codeine Other (See Comments)    Jittery     Medications Prior to Admission  Medication Sig Dispense Refill   buPROPion (WELLBUTRIN XL) 150 MG 24 hr tablet Take 150 mg by mouth daily.     ferrous sulfate 325 (65 FE) MG tablet Take 1 tablet (325 mg total) by mouth every other day. 15 tablet 3   furosemide (LASIX) 40 MG tablet Take 1 tablet (40 mg total) by mouth 2 (two) times daily.     ibuprofen (ADVIL) 200 MG tablet Take 600 mg by mouth every 4 (four) hours as needed for moderate pain.     oxyCODONE (ROXICODONE) 5 MG immediate release tablet Take 1 tablet (5 mg total) by mouth every 4 (four) hours as needed for severe pain. 30 tablet 0   propranolol (INDERAL) 20 MG tablet TAKE 1 TABLET (20 MG TOTAL) BY MOUTH 2 (TWO) TIMES DAILY. TAKE 20MG  TWICE DAILY. GOAL IS A RESTING HEART RATE OF 55-60. 180 tablet 2     Home: Home Living Family/patient expects to be discharged to:: Private residence Living Arrangements: Parent (dad lives with him) Available Help at Discharge: Family Type of Home: House Home Access: Stairs to enter [employer] of Steps: can enter from garage 3 steps no rail, main entrance 3 brick steps with porch, has metal rail on outside of platform  with wooden post holding up awning Home Layout: Multi-level Alternate Level Stairs-Number of Steps: half bath on first floor no shower, was sleeping in recliner; shower is up 9-10 steps then landing then 6-7 steps to bathrooms Bathroom Shower/Tub: Tub/shower unit, Secretary/administrator is walk in shower, tub shower is in hallway) Firefighter: Standard Home Equipment: Crutches, Wheelchair - manual Additional Comments: tub shower in the basement   Functional History: Prior Function Prior Level of Function : Independent/Modified Independent 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  Presents  with poor eccentric load. Ambulation/Gait Ambulation/Gait assistance: Min guard Gait Distance (Feet): 75 Feet Assistive device: Rolling walker (2 wheels) Gait Pattern/deviations: Step-to pattern, Trunk flexed General Gait Details: Cues for proximity to RW and safety this session.  He fatigues quickly with mobility.  Endurance remains to limit distance. 2 standing rest breaks and 1 seated rest break (Similar presentation to previous sessions) Gait velocity: Decreased velocity Gait velocity interpretation: <1.31 ft/sec, indicative of household ambulator Stairs: Yes (remains unable but practiced increasing hop height x 5 reps to prepare for stair training.  Pt able to hop about 3 inch height and needs to be able to clear a 6 inch step.) Stairs assistance: Total assist Stair Management:  Backwards, With walker Number of Stairs: 0 General stair comments: Pt was unable to clear one step and was unable to sit on step to perform stairs per home set up with Max A from this physical therapist.   ADL: ADL Overall ADL's : Needs assistance/impaired Eating/Feeding: Independent, Sitting Eating/Feeding Details (indicate cue type and reason): simulated Grooming: Wash/dry face, Wash/dry hands, Supervision/safety, Sitting (seated in chair in front of sink to shave face) Grooming Details (indicate cue type and reason): Educated pt on the use of hip postioning and trunk propping to support weight in standing against surfaces and reducing physical exertion of LLE. Upper Body Bathing: Set up, Sitting Lower Body Bathing: Minimal assistance, Sit to/from stand Lower Body Bathing Details (indicate cue type and reason): slight difficulty reaching his left foot sitting in the recliner Upper Body Dressing : Set up, Sitting Lower Body Dressing: Sitting/lateral leans, Supervision/safety Lower Body Dressing Details (indicate cue type and reason): don socks with supervision using bed to assist with propping LLE. Toilet Transfer: Agricultural consultant (2 wheels), Hydrographic surveyor Details (indicate cue type and reason): simulated, pt declined need to toilet Toileting- Clothing Manipulation and Hygiene: Min guard, Sit to/from stand Functional mobility during ADLs: Min guard, Rolling walker (2 wheels) General ADL Comments: Educated pt on the use of UE support to complete dynamic reaching for objects on the floor, pt successfully completed activity but provided with min cues for safety and optimal RW position to maintain BOS. He did elicit pain in hip flexors after movement, reinforced hip flexor stretches. Also discussed with pt getting a reacher if desired.   Cognition: Cognition Overall Cognitive Status: Within Functional Limits for tasks assessed Orientation Level: Oriented  X4 Cognition Arousal/Alertness: Awake/alert Behavior During Therapy: WFL for tasks assessed/performed Overall Cognitive Status: Within Functional Limits for tasks assessed      Physical Exam: Blood pressure (!) 111/50, pulse 68, temperature 98.5 F (36.9 C), resp. rate 18, SpO2 99%. Physical Exam Vitals and nursing note reviewed.  Constitutional:      Appearance: Normal appearance.     Comments: Obese male. Working on Animator. NAD  Pulmonary:     Breath sounds: Normal breath sounds.  Abdominal:     General: There is no distension.  Musculoskeletal:        General: Swelling (1+ LLE) present.     Comments: Splint on RLE  Skin:    General: Skin is warm and dry.  Neurological:     Mental Status: He is alert and oriented to person, place, and time.     General: No acute distress, sitting in recliner, appears comfortable HEENT: Head is normocephalic, atraumatic, PERRLA, EOMI, sclera anicteric, oral mucosa pink and moist, dentition intact, ext ear canals clear,  Neck: Supple without JVD or lymphadenopathy Heart: Reg rate and  rhythm. No murmurs rubs or gallops Chest: CTA bilaterally without wheezes, rales, or rhonchi; no distress Abdomen: Soft, non-tender, non-distended, bowel sounds positive. Extremities: Edema 1+ left lower extremity, splint in place right lower extremity Psych: Pt's affect is appropriate. Pt is cooperative Skin: Clean and intact without signs of breakdown Neuro: Alert and oriented x 4, follows commands, cranial nerves II through XII intact, normal speech and language Strength 5 out of 5 in bilateral upper extremities and left lower extremity Strength antigravity in resistance and right hip flexion and knee extension, able to wiggle toes in the right foot Sensation intact to light touch in all 4 extremities and toes on the right foot No ataxia or dysmetria noted Musculoskeletal:  No hypertonia    Results for orders placed or performed during the hospital  encounter of 12/25/22 (from the past 48 hour(s))  CBC     Status: Abnormal   Collection Time: 01/06/23 11:54 PM  Result Value Ref Range   WBC 3.6 (L) 4.0 - 10.5 K/uL   RBC 3.08 (L) 4.22 - 5.81 MIL/uL   Hemoglobin 7.4 (L) 13.0 - 17.0 g/dL   HCT 19.1 (L) 47.8 - 29.5 %   MCV 79.9 (L) 80.0 - 100.0 fL   MCH 24.0 (L) 26.0 - 34.0 pg   MCHC 30.1 30.0 - 36.0 g/dL   RDW 62.1 (H) 30.8 - 65.7 %   Platelets 129 (L) 150 - 400 K/uL   nRBC 0.0 0.0 - 0.2 %    Comment: Performed at [facility], [address] [place], [place] [address]   No results found.    Blood pressure (!) 111/50, pulse 68, temperature 98.5 F (36.9 C), resp. rate 18, SpO2 99%.  Medical Problem List and Plan: 1. Functional deficits secondary to right ankle fracture s/p ORIF bimalleolar ankle fracture with fixation, right syndesmosis by Dr. Dion Saucier on 12/28/2022  and upper GI bleed patient -patient may  shower if right lower extremity is Covered  -ELOS/Goals: 7 to 10 days, supervision with PT, supervision with OT  -Admit to CIR  -Follow-up with orthopedics around 01/11/2023  -Nonweightbearing right lower extremity, elevate right lower extremity when in bed 2.  Antithrombotics: -DVT/anticoagulation:  Mechanical: Sequential compression devices, below knee Bilateral lower extremities -Avoid pharmacological DVT prophylaxis due to variceal bleeding  -antiplatelet therapy: N/A  3. Pain Management:  Tylenol or oxycodone prn. Ultram added for moderate pain prn.  4. Mood/Behavior/Sleep: LCSW to follow for evaluation and support.  --trazodone prn for insomnia.  Has been sleeping from 9p- 5a (getting used to hospital)  -antipsychotic agents:  N/A 5. Neuropsych/cognition: This patient is capable of making decisions on his own behalf. 6. Skin/Wound Care: Routine pressure relief  7. Fluids/Electrolytes/Nutrition: Monitor I/O. Check CMET in am 8. Displaced Right bimalleolar ankle Fx s/p ORIF 12/28/22: NWB with splint.  9. UGIB s/p  esophageal varices banding: Continue to monitor H/H as trending 7.2- 7.7 range.  Continue propranolol. 10. Cirrhosis of the Liver: Continue inderal and PPI.  Continue Lasix.  Monitor for encephalopathy/hematemesis/melena.   --father had hepatitis w/cirrhosis of liver/mother had liver disease.   --continue to educate on importance of ETOH abstinence (ready to quit) 11. Acute on chronic anemia: Started on iron supplement.   12. Thrombocytopenia: Monitor for signs of recurrent bleeding.  Recheck labs 13. BLE edema: Continue Lasix bid. Elevate BLE when seated. TED on RLE  --low salt diet. Monitor for need of K supplement.  14. Morbid obesity: dietary counseling  Body mass index  is 47.43 kg/m.    Jacquelynn Cree, PA-C 01/07/2023  I have personally performed a face to face diagnostic evaluation of this patient and formulated the key components of the plan.  Additionally, I have personally reviewed laboratory data, imaging studies, as well as relevant notes and concur with the physician assistant's documentation above.  The patient's status has not changed from the original H&P.  Any changes in documentation from the acute care chart have been noted above.  Fanny Dance, MD, [place] [facility]

## 2023-01-07 NOTE — Discharge Summary (Signed)
Physician Discharge Summary   Patient: Thomas [name] MRN: [id number] DOB: [birth year]  Admit date:     12/25/2022  Discharge date: 01/07/23  Discharge Physician: Alberteen Sam   PCP: [name], MD     Recommendations at discharge:  Follow up with PCP Dr. Doreene Burke after discharge for variceal bleeding Follow-up with [facility] for repeat EGD in 3 to 4 weeks for surveillance of esophageal varices Follow up with Orthopedics in 1 week for ankle fracture Repeat CBC in 1 week and iron studies in 4-6 weeks     Discharge Diagnoses: Principal Problem:   Acute Upper GI Bleed due to varices Active Problems:   Acute blood loss anemia   Cirrhosis of liver (HCC)   Esophageal varices in alcoholic cirrhosis (HCC)   Class 3 severe obesity due to excess calories with body mass index (BMI) of 50.0 to 59.9 in adult (HCC)   OSA (obstructive sleep apnea)   Psoriatic arthritis (HCC)   Closed right ankle fracture   Thrombocytopenia [facility])     Hospital Course: Mr. Fawson is a 47 y.o. M with hx cirrhosis, obesity, psoriatic arthritis who presented with UGIB and right ankle fracture.      Significant events: 7/15: Seen in ER for ankle fracture, reduced and discharged home  7/18: Returned with hematemesis, admitted on PPI drip 7/19: GI and Ortho consulted; underwent EGD that showed 3 columns of large varices, with stigmata of recent bleeding, four bands placed; portal gastropathy noted 7/21: ORIF of right ankle by Ortho    Significant studies: 7/29 bilateral doppler US -- negative for DVT   Significant microbiology data: 7/18 COVID negative 7/20 MRSA nares negative   Procedures: 7/19: EGD by Dr. Lavon Paganini 7/21: ORIF bimalleolar ankle fracture with fixation of medial and lateral; Open reduction internal fixation right syndesmosis by Dr. Dion Saucier    Consults: GI Orthopedics       * Secondary esophageal varices with bleeding (HCC) - Continue PPI twice daily -  Soft diet for 1 more week  - Continue new propranolol - Follow-up with [facility] for repeat EGD in 3 to 4 weeks for surveillance of esophageal varices     Closed right ankle fracture - NWB RLE - Outpatient follow up with Dr. Lajoyce Corners - Avoid DVT PPx given variceal bleeding  Cirrhosis of liver (HCC) On Lasix and propranolol.  GI follow up pending.  Acute blood loss anemia Hgb stable at 7.4.  Microcytic.  Discharged on oral iron.                The [facility] Controlled Substances Registry was reviewed for this patient prior to discharge.   Consultants: GI, Ortho Procedures performed: EGD with banding  Disposition:  Inpatient rehab Diet recommendation:  Discharge Diet Orders (From admission, onward)     Start     Ordered   01/01/23 0000  Diet - low sodium heart healthy        01/01/23 1334   01/01/23 0000  Diet - low sodium heart healthy        01/01/23 1423             DISCHARGE MEDICATION: Allergies as of 01/07/2023       Reactions   Codeine Other (See Comments)   Jittery         Medication List     STOP taking these medications    cyclobenzaprine 5 MG tablet Commonly known as: FLEXERIL   oxyCODONE-acetaminophen 5-325 MG tablet Commonly known  as: PERCOCET/ROXICET   pantoprazole 40 MG tablet Commonly known as: PROTONIX       TAKE these medications    Advil 200 MG tablet Generic drug: ibuprofen Take 600 mg by mouth every 4 (four) hours as needed for moderate pain.   buPROPion 150 MG 24 hr tablet Commonly known as: WELLBUTRIN XL Take 150 mg by mouth daily.   ferrous sulfate 325 (65 FE) MG tablet Take 1 tablet (325 mg total) by mouth every other day.   furosemide 40 MG tablet Commonly known as: LASIX Take 1 tablet (40 mg total) by mouth 2 (two) times daily.   oxyCODONE 5 MG immediate release tablet Commonly known as: Roxicodone Take 1 tablet (5 mg total) by mouth every 4 (four) hours as needed for severe pain.   propranolol  20 MG tablet Commonly known as: INDERAL TAKE 1 TABLET (20 MG TOTAL) BY MOUTH 2 (TWO) TIMES DAILY. TAKE 20MG  TWICE DAILY. GOAL IS A RESTING HEART RATE OF 55-60.               Durable Medical Equipment  (From admission, onward)           Start     Ordered   01/01/23 1138  For home use only DME Hospital bed  Once       Comments: Therapeutic mattress  Question Answer Comment  Length of Need 6 Months   Patient has (list medical condition): rt ankle ORIF   The above medical condition requires: Patient requires the ability to reposition frequently   Head must be elevated greater than: 45 degrees   Bed type Semi-electric      01/01/23 1140   12/30/22 1247  For home use only DME Hospital bed  Once       Question Answer Comment  Length of Need 6 Months   Patient has (list medical condition): ORIF  of right ankle   The above medical condition requires: Patient requires the ability to reposition frequently   Head must be elevated greater than: 45 degrees   Bed type Semi-electric      12/30/22 1255   12/29/22 1641  For home use only DME 3 n 1  Once        12/29/22 1640   12/29/22 1641  For home use only DME Walker rolling  Once       Question Answer Comment  Walker: With 5 Inch Wheels   Patient needs a walker to treat with the following condition Closed right ankle fracture      12/29/22 1640            Follow-up Information     Teryl Lucy, MD. Schedule an appointment as soon as possible for a visit in 2 week(s).   Specialty: Orthopedic Surgery Contact information: [address] [place] [address] [phone]         [facility] Follow up.   Specialty: Home Health Services Contact information: [address] [place] [address] [phone]                 Discharge Instructions     Diet - low sodium heart healthy   Complete by: As directed    Diet - low sodium heart healthy   Complete by: As  directed    Increase activity slowly   Complete by: As directed    Increase activity slowly   Complete by: As directed  Discharge Exam: Filed Weights   12/25/22 2050 12/26/22 1316 12/28/22 0640  Weight: 128.9 kg 129.3 kg 129.3 kg    General: Pt is alert, awake, not in acute distress Cardiovascular: RRR, nl S1-S2, no murmurs appreciated.   No LE edema.   Respiratory: Normal respiratory rate and rhythm.  CTAB without rales or wheezes. Abdominal: Abdomen soft and non-tender.  No distension or HSM.   Neuro/Psych: Strength symmetric in upper and lower extremities.  Judgment and insight appear normal.   Condition at discharge: good  The results of significant diagnostics from this hospitalization (including imaging, microbiology, ancillary and laboratory) are listed below for reference.   Imaging Studies: VAS [place] LOWER EXTREMITY VENOUS (DVT)  Result Date: 01/05/2023  Lower Venous DVT Study Patient Name:  ARPAD HALDER  Date of Exam:   01/05/2023 Medical Rec #: [id number]       Accession #:    [id number] Date of Birth: 17-Feb-1976        Patient Gender: M Patient Age:   62 years Exam Location:  [facility] Procedure:      VAS [place] LOWER EXTREMITY VENOUS (DVT) Referring Phys: Clerance Lav VASIREDDY --------------------------------------------------------------------------------  Indications: Edema.  Risk Factors: Trauma Right foot fracture obesity. Limitations: Bandages. Comparison Study: No prior study Performing Technologist: Shona Simpson  Examination Guidelines: A complete evaluation includes B-mode imaging, spectral Doppler, color Doppler, and power Doppler as needed of all accessible portions of each vessel. Bilateral testing is considered an integral part of a complete examination. Limited examinations for reoccurring indications may be performed as noted. The reflux portion of the exam is performed with the patient in reverse Trendelenburg.   +---------+---------------+---------+-----------+----------+--------------+ RIGHT    CompressibilityPhasicitySpontaneityPropertiesThrombus Aging +---------+---------------+---------+-----------+----------+--------------+ CFV      Full           Yes      Yes                                 +---------+---------------+---------+-----------+----------+--------------+ SFJ      Full                                                        +---------+---------------+---------+-----------+----------+--------------+ FV Prox  Full                                                        +---------+---------------+---------+-----------+----------+--------------+ FV Mid   Full                                                        +---------+---------------+---------+-----------+----------+--------------+ FV DistalFull                                                        +---------+---------------+---------+-----------+----------+--------------+ PFV      Full                                                        +---------+---------------+---------+-----------+----------+--------------+  POP      Full           Yes      Yes                                 +---------+---------------+---------+-----------+----------+--------------+ PTV      Full                                                        +---------+---------------+---------+-----------+----------+--------------+ PERO     Full                                                        +---------+---------------+---------+-----------+----------+--------------+ Examination of Right PTVs and Peroeal Veins were limited to the top of the calf due to bandaging.  +---------+---------------+---------+-----------+----------+--------------+ LEFT     CompressibilityPhasicitySpontaneityPropertiesThrombus Aging +---------+---------------+---------+-----------+----------+--------------+ CFV      Full            Yes      Yes                                 +---------+---------------+---------+-----------+----------+--------------+ SFJ      Full                                                        +---------+---------------+---------+-----------+----------+--------------+ FV Prox  Full                                                        +---------+---------------+---------+-----------+----------+--------------+ FV Mid   Full                                                        +---------+---------------+---------+-----------+----------+--------------+ FV DistalFull                                                        +---------+---------------+---------+-----------+----------+--------------+ PFV      Full                                                        +---------+---------------+---------+-----------+----------+--------------+ POP      Full           Yes      Yes                                 +---------+---------------+---------+-----------+----------+--------------+  PTV      Full                                                        +---------+---------------+---------+-----------+----------+--------------+ PERO     Full                                                        +---------+---------------+---------+-----------+----------+--------------+     Summary: BILATERAL: - No evidence of deep vein thrombosis seen in the lower extremities, bilaterally. -No evidence of popliteal cyst, bilaterally.   *See table(s) above for measurements and observations. Electronically signed by Gerarda Fraction on 01/05/2023 at 5:59:00 PM.    Final    DG Ankle Right Port  Result Date: 12/28/2022 CLINICAL DATA:  Closed right ankle fracture. EXAM: PORTABLE RIGHT ANKLE - 2 VIEW COMPARISON:  Right ankle radiographs 12/22/2022 FINDINGS: There has been interval ORIF of the previously described fractures of the distal fibula and medial malleolus as well as syndesmosis  repair. A lateral fixation plate and screws have been placed in the distal fibula, and a single screw traverses the medial malleolus fracture. Alignment is now anatomic. A splint is in place. IMPRESSION: Interval ORIF of distal tibia and fibula fractures with anatomic alignment. Electronically Signed   By: Sebastian Ache M.D.   On: 12/28/2022 12:54   DG MINI C-ARM IMAGE ONLY  Result Date: 12/28/2022 There is no interpretation for this exam.  This order is for images obtained during a surgical procedure.  Please See "Surgeries" Tab for more information regarding the procedure.   DG Ankle 2 Views Right  Result Date: 12/22/2022 CLINICAL DATA:  Postreduction EXAM: RIGHT ANKLE - 2 VIEW COMPARISON:  12/22/2022 FINDINGS: Interval placement of cast material, obscuring some bone detail. Fractures are again demonstrated in the distal right fibula and medial malleolus. There is about 9 mm residual lateral displacement of the fracture fragments and of the talus with respect to the tibia. IMPRESSION: Persistent lateral displacement of fracture fragments postreduction and casting. Electronically Signed   By: Burman Nieves M.D.   On: 12/22/2022 22:42   DG Tibia/Fibula Right  Result Date: 12/22/2022 CLINICAL DATA:  Ankle fracture EXAM: RIGHT TIBIA AND FIBULA - 2 VIEW COMPARISON:  12/22/2022 FINDINGS: Acute comminuted and displaced medial and lateral malleolar fractures at the ankle with lateral subluxation of the talar dome. Proximal tibia and fibula appear intact. IMPRESSION: Acute comminuted and displaced medial and lateral malleolar fractures at the ankle with lateral subluxation of the talar dome. Electronically Signed   By: Jasmine Pang M.D.   On: 12/22/2022 17:28   DG Ankle Complete Right  Result Date: 12/22/2022 CLINICAL DATA:  Right ankle pain and swelling after injury 2 days ago EXAM: RIGHT ANKLE - COMPLETE 3+ VIEW COMPARISON:  None Available. FINDINGS: Moderate lateral dislocation of the talus relative  to distal tibia is noted with associated fractures involving the medial malleolus and distal right fibula. IMPRESSION: Moderate right talotibial joint dislocation with associated medial malleolar and distal right fibular fractures. Electronically Signed   By: Lupita Raider M.D.   On: 12/22/2022 14:23   DG Foot Complete Right  Result Date: 12/22/2022  CLINICAL DATA:  Right foot pain after injury 2 days ago. EXAM: RIGHT FOOT COMPLETE - 3+ VIEW COMPARISON:  None Available. FINDINGS: Displaced fractures are seen involving the distal right tibia and fibula. No other fracture is noted. IMPRESSION: Displaced distal right tibial and fibular fractures. Electronically Signed   By: Lupita Raider M.D.   On: 12/22/2022 14:21    Microbiology: Results for orders placed or performed during the hospital encounter of 12/25/22  Resp panel by RT-PCR (RSV, Flu A&B, Covid) Anterior Nasal Swab     Status: None   Collection Time: 12/25/22  4:30 PM   Specimen: Anterior Nasal Swab  Result Value Ref Range Status   SARS Coronavirus 2 by RT PCR NEGATIVE NEGATIVE Final   Influenza A by PCR NEGATIVE NEGATIVE Final   Influenza B by PCR NEGATIVE NEGATIVE Final    Comment: (NOTE) The Xpert Xpress SARS-CoV-2/FLU/RSV plus assay is intended as an aid in the diagnosis of influenza from Nasopharyngeal swab specimens and should not be used as a sole basis for treatment. Nasal washings and aspirates are unacceptable for Xpert Xpress SARS-CoV-2/FLU/RSV testing.  Fact Sheet for Patients: BloggerCourse.com  Fact Sheet for Healthcare Providers: SeriousBroker.it  This test is not yet approved or cleared by the [place] FDA and has been authorized for detection and/or diagnosis of SARS-CoV-2 by FDA under an Emergency Use Authorization (EUA). This EUA will remain in effect (meaning this test can be used) for the duration of the COVID-19 declaration under Section 564(b)(1) of  the Act, 21 U.S.C. section 360bbb-3(b)(1), unless the authorization is terminated or revoked.     Resp Syncytial Virus by PCR NEGATIVE NEGATIVE Final    Comment: (NOTE) Fact Sheet for Patients: BloggerCourse.com  Fact Sheet for Healthcare Providers: SeriousBroker.it  This test is not yet approved or cleared by the [place] FDA and has been authorized for detection and/or diagnosis of SARS-CoV-2 by FDA under an Emergency Use Authorization (EUA). This EUA will remain in effect (meaning this test can be used) for the duration of the COVID-19 declaration under Section 564(b)(1) of the Act, 21 U.S.C. section 360bbb-3(b)(1), unless the authorization is terminated or revoked.  Performed at [facility], [address] [place], [place] [address]   Surgical pcr screen     Status: None   Collection Time: 12/27/22 11:03 PM   Specimen: Nasal Mucosa; Nasal Swab  Result Value Ref Range Status   MRSA, PCR NEGATIVE NEGATIVE Final   Staphylococcus aureus NEGATIVE NEGATIVE Final    Comment: (NOTE) The Xpert SA Assay (FDA approved for NASAL specimens in patients 67 years of age and older), is one component of a comprehensive surveillance program. It is not intended to diagnose infection nor to guide or monitor treatment. Performed at [facility], [address], [place], [place] [address]     Labs: CBC: Recent Labs  Lab 01/03/23 1427 01/06/23 2354  WBC 3.6* 3.6*  HGB 7.2* 7.4*  HCT 24.5* 24.6*  MCV 79.5* 79.9*  PLT 133* 129*   Basic Metabolic Panel: Recent Labs  Lab 01/03/23 1427  NA 138  K 4.0  CL 108  CO2 26  GLUCOSE 106*  BUN 10  CREATININE 0.79  CALCIUM 7.8*   Liver Function Tests: No results for input(s): "AST", "ALT", "ALKPHOS", "BILITOT", "PROT", "ALBUMIN" in the last 168 hours. CBG: No results for input(s): "GLUCAP" in the last 168 hours.  Discharge time spent: approximately 35 minutes spent  on discharge counseling, evaluation of  patient on day of discharge, and coordination of discharge planning with nursing, social work, pharmacy and case management  Signed: Alberteen Sam, MD [facility] 01/07/2023

## 2023-01-07 NOTE — Progress Notes (Signed)
PMR Admission Coordinator Pre-Admission Assessment   Patient: Thomas [name] is an 47 y.o., male MRN: [id number] DOB: [birth year] Height: 5\' 5"  (165.1 cm) Weight: 129.3 kg   Insurance Information HMO:     PPO: Yes     PCP:       IPA:       80/20:       OTHER: Group [id number] PRIMARY: [facility] [place]      Policy#: [id number]      Subscriber: patient CM Name:       Phone#: [phone]     Fax#: [phone] Pre-Cert#: [id number] Approved 7/27-01/09/23      Employer: [employer] Benefits:  Phone #: [phone]     Name: online at [email or web address] Eff. Date: 06/09/22     Deduct: $1600 (met $1600)      Out of Pocket Max: $2600 (met $2600)      Life Max: n/a CIR: 90%      SNF: 90% Outpatient: 90%     Co-Pay: 10% Home Health: 90%      Co-Pay: 10% DME: 90%     Co-Pay: 10% Providers: in network  SECONDARY:       Policy#:      Phone#:    Artist:       Phone#:    The Data processing manager" for patients in Inpatient Rehabilitation Facilities with attached "Privacy Act Statement-Health Care Records" was provided and verbally reviewed with: N/A   Emergency Contact Information Contact Information       Name Relation Home Work Mobile    [place] Father     [phone]         Other Contacts       Name Relation Home Work Mobile    Betancourth,Manuel Relative     [phone]    Lillie Fragmin     [phone]           Current Medical History  Patient Admitting Diagnosis: Right ankle fracture, UGI bleed   History of Present Illness: A 47 year old male with history of cirrhosis, esophageal varices, alcohol abuse, obesity, obstructive sleep apnea, psoriatic arthritis, anemia presents to the [facility] emergency department with complaints of nausea, vomiting, hematemesis. Patient had a recent ER visit for right foot fracture reduced under sedation, since then has been experiencing nausea, vomiting. GI is consulted, underwent EGD on  12/26/2022, found to have 3 large columns of nonbleeding esophageal varices s/p 4 band ligations in addition to a large clot/altered body in the fundus of the stomach, underwent suction, irrigation also found to have portal hypertensive gastropathy. GI is recommended to continue with the Protonix, Rocephin for 5 days. PT/OT evaluations completed with recommendations for acute inpatient rehab admission.   Patient's medical record from [facility] has been reviewed by the rehabilitation admission coordinator and physician.   Past Medical History      Past Medical History:  Diagnosis Date   Acute conjunctivitis of both eyes 10/22/2021   Acute sinusitis 06/16/2013   Alcoholic fatty liver 01/16/2010    Needs final HBV and HAV vaccines on or after 10/25/2012    Alcoholism (HCC) 12/25/2011   Allergic rhinitis     Childhood asthma     Elevated transaminase level 06/10/2007    AST: 80 ALT: 136 in 8/11: Hepatitis A., B and C negative.    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    Sleep apnea            Has the patient had major surgery during 100 days prior to admission? Yes   Family History   family history includes Depression in his mother; Diabetes in his father and another family member; Hypertension in his father; Liver disease in his father, mother, and sister.   Current Medications  Current Medications    Current Facility-Administered Medications:    0.9 %  sodium chloride infusion (Manually program via Guardrails IV Fluids), , Intravenous, Once, Brown, Blaine K, PA-C   0.9 %  sodium chloride infusion (Manually program via Guardrails IV Fluids), , Intravenous, Once, Brown, Blaine K, PA-C   0.9 %  sodium chloride infusion (Manually program via Guardrails IV Fluids), , Intravenous, Once, Brown, Blaine K, PA-C   0.9 %   sodium chloride infusion (Manually program via Guardrails IV Fluids), , Intravenous, Once, Narda Bonds, MD   acetaminophen (TYLENOL) tablet 650 mg, 650 mg, Oral, Q4H PRN, 650 mg at 01/04/23 2141 **OR** acetaminophen (TYLENOL) suppository 650 mg, 650 mg, Rectal, Q6H PRN, Manson Passey, Blaine K, PA-C   alum & mag hydroxide-simeth (MAALOX/MYLANTA) 200-200-20 MG/5ML suspension 30 mL, 30 mL, Oral, Q4H PRN, Narda Bonds, MD, 30 mL at 01/02/23 0021   furosemide (LASIX) tablet 40 mg, 40 mg, Oral, BID, Vasireddy, Clerance Lav, MD, 40 mg at 01/05/23 0947   HYDROmorphone (DILAUDID) injection 0.5 mg, 0.5 mg, Intravenous, Q4H PRN, Manson Passey, Blaine K, PA-C   ondansetron [facility]) injection 4 mg, 4 mg, Intravenous, Q6H PRN, Manson Passey, Blaine K, PA-C   oxyCODONE (Oxy IR/ROXICODONE) immediate release tablet 5-10 mg, 5-10 mg, Oral, Q4H PRN, Manson Passey, Blaine K, PA-C, 5 mg at 01/03/23 2238   pantoprazole (PROTONIX) EC tablet 40 mg, 40 mg, Oral, BID, Pham, Minh Q, RPH-CPP, 40 mg at 01/05/23 0948   propranolol (INDERAL) tablet 20 mg, 20 mg, Oral, BID, Narda Bonds, MD, 20 mg at 01/05/23 0948   sodium chloride flush (NS) 0.9 % injection 3 mL, 3 mL, Intravenous, Q12H, Brown, Blaine K, PA-C, 3 mL at 01/05/23 0949     Patients Current Diet:  Diet Order                  Diet - low sodium heart healthy             Diet - low sodium heart healthy             DIET SOFT Room service appropriate? Yes with Assist; Fluid consistency: Thin  Diet effective now                         Precautions / Restrictions Precautions Precautions: Fall Restrictions Weight Bearing Restrictions: Yes RLE Weight Bearing: Non weight bearing    Has the patient had 2 or more falls or a fall with injury in the past year? Yes   Prior Activity Level Community (5-7x/wk): Went out most days, was driving.   Prior Functional Level Self Care: Did the patient need help bathing, dressing, using the toilet or eating? Independent   Indoor Mobility: Did  the patient need assistance with walking from room to room (with or without device)? Independent   Stairs: Did the patient need assistance with internal or external stairs (with or without device)? Independent   Functional Cognition: Did the patient need help planning regular tasks such as shopping or remembering to take  medications? Independent   Patient Information Are you of Hispanic, Latino/a,or Spanish origin?: E. Yes, another Hispanic, Latino, or Spanish origin What is your race?: A. White Do you need or want an interpreter to communicate with a doctor or health care staff?: 0. No   Patient's Response To:  Health Literacy and Transportation Is the patient able to respond to health literacy and transportation needs?: Yes Health Literacy - How often do you need to have someone help you when you read instructions, pamphlets, or other written material from your doctor or pharmacy?: Never In the past 12 months, has lack of transportation kept you from medical appointments or from getting medications?: No In the past 12 months, has lack of transportation kept you from meetings, work, or from getting things needed for daily living?: No   Journalist, newspaper / Equipment Home Assistive Devices/Equipment: None Home Equipment: Crutches, Wheelchair - manual   Prior Device Use: Indicate devices/aids used by the patient prior to current illness, exacerbation or injury? None of the above   Current Functional Level Cognition   Overall Cognitive Status: Within Functional Limits for tasks assessed Orientation Level: Oriented X4    Extremity Assessment (includes Sensation/Coordination)   Upper Extremity Assessment: Overall WFL for tasks assessed  Lower Extremity Assessment: Defer to PT evaluation RLE Deficits / Details: lifts leg antigravity, wiggles toes and can feel touch to toes, noted cast on ankle/foot     ADLs   Overall ADL's : Needs assistance/impaired Eating/Feeding: Independent,  Sitting Eating/Feeding Details (indicate cue type and reason): simulated Grooming: Standing, Min guard Grooming Details (indicate cue type and reason): Educated pt on the use of hip postioning and trunk propping to support weight in standing against surfaces and reducing physical exertion of LLE. Upper Body Bathing: Set up, Sitting Lower Body Bathing: Minimal assistance, Sit to/from stand Lower Body Bathing Details (indicate cue type and reason): slight difficulty reaching his left foot sitting in the recliner Upper Body Dressing : Set up, Sitting Lower Body Dressing: Sitting/lateral leans, Supervision/safety Lower Body Dressing Details (indicate cue type and reason): don socks with supervision using bed to assist with propping LLE. Toilet Transfer: Agricultural consultant (2 wheels), Hydrographic surveyor Details (indicate cue type and reason): simulated, pt declined need to toilet Toileting- Clothing Manipulation and Hygiene: Min guard, Sit to/from stand Functional mobility during ADLs: Min guard, Rolling walker (2 wheels) General ADL Comments: Educated pt on the use of UE support to complete dynamic reaching for objects on the floor, pt successfully completed activity but provided with min cues for safety and optimal RW position to maintain BOS. He did elicit pain in hip flexors after movement, reinforced hip flexor stretches. Also discussed with pt getting a reacher if desired.     Mobility   Overal bed mobility: Modified Independent General bed mobility comments: Increased time and effort to move to edge of bed and back to bed but no assistance needed,     Transfers   Overall transfer level: Needs assistance Equipment used: Rolling walker (2 wheels) Transfers: Sit to/from Stand Sit to Stand: Min guard Bed to/from chair/wheelchair/BSC transfer type:: Step pivot Step pivot transfers: Min guard General transfer comment: Pt able to adhere to weightbearing restrictions throughout sit to stand  and stand step transfers.     Ambulation / Gait / Stairs / Wheelchair Mobility   Ambulation/Gait Ambulation/Gait assistance: Editor, commissioning (Feet): 30 Feet (+ 10 ft) Assistive device: Rolling walker (2 wheels) (bariatric) Gait Pattern/deviations: Step-to pattern, Trunk  flexed General Gait Details: Cues for sequencing and safety this session.  He fatigues quickly with mobility. Gait velocity: Decreased velocity Gait velocity interpretation: <1.31 ft/sec, indicative of household ambulator Stairs: Yes Stairs assistance: Total assist Stair Management: Backwards, With walker Number of Stairs: 0 General stair comments: Pt was unable to clear one step and was unable to sit on step to perform stairs per home set up with Max A from this physical therapist.     Posture / Balance Balance Overall balance assessment: Needs assistance Sitting-balance support: Feet supported Sitting balance-Leahy Scale: Good Standing balance support: Bilateral upper extremity supported, During functional activity, Reliant on assistive device for balance Standing balance-Leahy Scale: Poor Standing balance comment: Pt needs UE support     Special needs/care consideration Skin Has a cast with ace wrap on right lower extremity ankle fracture area and Special service needs none    Previous Home Environment (from acute therapy documentation) Living Arrangements: Parent (dad lives with him) Available Help at Discharge: Family Type of Home: House Home Layout: Multi-level Alternate Level Stairs-Number of Steps: half bath on first floor no shower, was sleeping in recliner; shower is up 9-10 steps then landing then 6-7 steps to bathrooms Home Access: Stairs to enter [employer] of Steps: can enter from garage 3 steps no rail, main entrance 3 brick steps with porch, has metal rail on outside of platform with wooden post holding up awning Bathroom Shower/Tub: Tub/shower unit, Secretary/administrator is  walk in shower, tub shower is in hallway) Firefighter: Standard Home Care Services: No Additional Comments: tub shower in the basement   Discharge Living Setting Plans for Discharge Living Setting: House, Lives with (comment) (Lives with dad.) Type of Home at Discharge: House Discharge Home Layout: Multi-level, 1/2 bath on main level (three level home.) Alternate Level Stairs-Rails: Left (Rail on left going down to basement.) Alternate Level Stairs-Number of Steps: 15-17 steps Discharge Home Access: Stairs to enter Entrance Stairs-Rails: None Entrance Stairs-Number of Steps: 4 steps Discharge Bathroom Shower/Tub: Tub/shower unit, Curtain Discharge Bathroom Toilet: Standard Discharge Bathroom Accessibility: No (unsure if door is wide enough for walker.) Does the patient have any problems obtaining your medications?: No   Social/Family/Support Systems Patient Roles: Other (Comment) (Has Dad.) Contact Information: [place] Suhy - father - [phone] Anticipated Caregiver: Stefanos Limbrick - father - Anticipated Caregiver's Contact Information: Dad [phone] Ability/Limitations of Caregiver: Dad lives with patient on 3rd level.  Dad is retired and can provide supervision. Caregiver Availability: 24/7 Discharge Plan Discussed with Primary Caregiver: Yes Is Caregiver In Agreement with Plan?: Yes Does Caregiver/Family have Issues with Lodging/Transportation while Pt is in Rehab?: No   Goals Patient/Family Goal for Rehab: PT/OT supervision goals Expected length of stay: 7-10 days Cultural Considerations: Latino but speaks Englsih well. Pt/Family Agrees to Admission and willing to participate: Yes Program Orientation Provided & Reviewed with Pt/Caregiver Including Roles  & Responsibilities: Yes   Decrease burden of Care through IP rehab admission: N/A   Possible need for SNF placement upon discharge: Not planned   Patient Condition: I have reviewed medical records from  [facility], spoken with CM, and patient. I met with patient at the bedside for inpatient rehabilitation assessment.  Patient will benefit from ongoing PT and OT, can actively participate in 3 hours of therapy a day 5 days of the week, and can make measurable gains during the admission.  Patient will also benefit from the coordinated team approach during an Inpatient Acute Rehabilitation admission.  The patient will receive intensive therapy as well as Rehabilitation physician, nursing, social worker, and care management interventions.  Due to bladder management, bowel management, safety, skin/wound care, disease management, medication administration, pain management, and patient education the patient requires 24 hour a day rehabilitation nursing.  The patient is currently min guard with mobility and basic ADLs.  Discharge setting and therapy post discharge at home with home health is anticipated.  Patient has agreed to participate in the Acute Inpatient Rehabilitation Program and will admit today.   Preadmission Screen Completed By:  Trish Mage, 01/05/2023 11:36 AM ______________________________________________________________________   Discussed status with Dr. Natale Lay on 01/07/23 at 930 and received approval for admission today.   Admission Coordinator:  Trish Mage, RN, time 1200 Dorna Bloom 01/07/23    Assessment/Plan: Diagnosis: UGIB and right ankle fracture  Does the need for close, 24 hr/day Medical supervision in concert with the patient's rehab needs make it unreasonable for this patient to be served in a less intensive setting? Yes Co-Morbidities requiring supervision/potential complications: Cirrhosis, Psoriatic arthritis, ABLA, obesity, Esophageal varices, OSA, Alcoholism Due to bladder management, bowel management, safety, skin/wound care, disease management, medication administration, pain management, and patient education, does the patient require 24 hr/day rehab nursing?  Yes Does the patient require coordinated care of a physician, rehab nurse, PT, OT, and SLP to address physical and functional deficits in the context of the above medical diagnosis(es)? Yes Addressing deficits in the following areas: balance, endurance, locomotion, strength, transferring, bowel/bladder control, bathing, dressing, feeding, grooming, toileting, and psychosocial support Can the patient actively participate in an intensive therapy program of at least 3 hrs of therapy 5 days a week? Yes The potential for patient to make measurable gains while on inpatient rehab is excellent Anticipated functional outcomes upon discharge from inpatient rehab: supervision PT, supervision OT, n/a SLP Estimated rehab length of stay to reach the above functional goals is: 7-10 Anticipated discharge destination: Home 10. Overall Rehab/Functional Prognosis: excellent     MD Signature: Fanny Dance

## 2023-01-07 NOTE — Progress Notes (Signed)
Inpatient Rehab Admissions Coordinator:    I have a CIR bed  for this Pt. Today. RN may call report to [phone].  Pt. To admit to CIR for an estimated 5-7 days with plan to reach mod I level and d/c home with father for support.   Megan Salon, MS, CCC-SLP Rehab Admissions Coordinator  [phone] (celll) [phone] (office)

## 2023-01-07 NOTE — Progress Notes (Signed)
Progress Note   Patient: Thomas [name]:[id number] DOB: 05-12-1976 DOA: 12/25/2022     13 DOS: the patient was seen and examined on 01/07/2023       Brief hospital course: [name] is a 47 y.o. M with hx cirrhosis, obesity, psoriatic arthritis who presented with UGIB and right ankle fracture.      Significant events: 7/15: Seen in ER for ankle fracture, reduced and discharged home  7/18: Returned with hematemesis, admitted on PPI drip 7/19: GI and Ortho consulted; underwent EGD that showed 3 columns of large varices, with stigmata of recent bleeding, four bands placed; portal gastropathy noted 7/21: ORIF of right ankle by Ortho    Significant studies: 7/29 bilateral doppler US -- negative for DVT   Significant microbiology data: 7/18 COVID negative 7/20 MRSA nares negative   Procedures: 7/19: EGD by Dr. Lavon Paganini 7/21: ORIF bimalleolar ankle fracture with fixation of medial and lateral; Open reduction internal fixation right syndesmosis by Dr. Dion Saucier    Consults: GI Orthopedics      Assessment and Plan: * Secondary esophageal varices with bleeding (HCC) Admitted 2 weeks ago with variceal bleed.  GI consulted and underwent EGD and banding.  No further bleeding.  Hgb has remained stable and low. - Continue PPI twice daily - Soft diet for 2 weeks (1 more week)  - Continue new propranolol - Follow-up with [facility] for repeat EGD in 3 to 4 weeks for surveillance of esophageal varices    Thrombocytopenia (HCC) Due to cirrhosis  Closed right ankle fracture - NWB RLE - Outpatient follow up - Avoid DVT PPx given variceal bleeding  Cirrhosis of liver (HCC) No HE.  Varices as above - Continue Lasix and propranolol - GI follow up  - Alcohol cessation  Psoriatic arthritis (HCC) Not on disease specific therapy  Acute blood loss anemia Hgb stable at 7.4.  Microcytic - Start iron - Repeat CBC in 1 week  Class 3 severe obesity due to excess calories  with body mass index (BMI) of 50.0 to 59.9 in adult (HCC) BMI 47  Esophageal varices in alcoholic cirrhosis (HCC) - Alcohol cessation - Follow up with [facility] - New propranolol  OSA (obstructive sleep apnea) - CPAP  Alcoholism (HCC) - Recommend alcohol cessation          Subjective: Feeling well, no reports of bleeding.  No nursing concerns.     Physical Exam: BP 121/63 (BP Location: Left Arm)   Pulse 63   Temp 98.3 F (36.8 C) (Oral)   Resp 16   Ht 5\' 5"  (1.651 m)   Wt 129.3 kg   SpO2 100%   BMI 47.43 kg/m   Obese adult male, lying in bed, watching television and eating breakfast, no acute distress, mild pallor noted RRR, no murmurs, no peripheral edema Respiratory normal, lungs clear without rales or wheezes Abdomen soft no tenderness palpation Attention normal, affect appropriate, oriented to person, place, and time, face symmetric, no psychomotor slowing    Data Reviewed: CBC shows hemoglobin 7.4, microcytic  Family Communication: None    Disposition: Status is: Inpatient The patient was admitted with a variceal bleed  This is resolved, unfortunately he has a right ankle fracture and is nonweightbearing and unable to return safely to his home  His ankle fracture and weakness he will need skilled inpatient rehab, insurance authorization pending        Author: Alberteen Sam, MD 01/07/2023 8:17 AM  For on call review [email or web address].

## 2023-01-08 DIAGNOSIS — L409 Psoriasis, unspecified: Secondary | ICD-10-CM

## 2023-01-08 DIAGNOSIS — D649 Anemia, unspecified: Secondary | ICD-10-CM

## 2023-01-08 DIAGNOSIS — M545 Low back pain, unspecified: Secondary | ICD-10-CM

## 2023-01-08 LAB — FOLATE: Folate: 10 ng/mL (ref >5.9)

## 2023-01-08 LAB — RETICULOCYTES
Immature Retic Fract: 28.8 % — ABNORMAL HIGH (ref 2.3–15.9)
RBC.: 3.05 MIL/uL — ABNORMAL LOW (ref 4.22–5.81)
Retic Count, Absolute: 69.8 10*3/uL (ref 19.0–186.0)
Retic Ct Pct: 2.3 % (ref 0.4–3.1)

## 2023-01-08 LAB — IRON AND TIBC
Iron: 17 ug/dL — ABNORMAL LOW (ref 45–182)
Saturation Ratios: 5 % — ABNORMAL LOW (ref 17.9–39.5)
TIBC: 354 ug/dL (ref 250–450)
UIBC: 337 ug/dL

## 2023-01-08 LAB — VITAMIN B12: Vitamin B-12: 880 pg/mL (ref 180–914)

## 2023-01-08 LAB — FERRITIN: Ferritin: 19 ng/mL — ABNORMAL LOW (ref 24–336)

## 2023-01-08 MED ORDER — FERROUS SULFATE 325 (65 FE) MG PO TABS
325.0000 mg | ORAL_TABLET | Freq: Every day | ORAL | Status: DC
  Administered 2023-01-08 – 2023-01-12 (×5): 325 mg via ORAL
  Filled 2023-01-08 (×5): qty 1

## 2023-01-08 MED ORDER — JUVEN PO PACK
1.0000 | PACK | Freq: Two times a day (BID) | ORAL | Status: DC
  Administered 2023-01-08 – 2023-01-09 (×2): 1 via ORAL
  Filled 2023-01-08 (×4): qty 1

## 2023-01-08 MED ORDER — DICLOFENAC SODIUM 1 % EX GEL
2.0000 g | Freq: Four times a day (QID) | CUTANEOUS | Status: DC
  Administered 2023-01-08 – 2023-01-12 (×5): 2 g via TOPICAL
  Filled 2023-01-08: qty 100

## 2023-01-08 MED ORDER — POTASSIUM CHLORIDE CRYS ER 20 MEQ PO TBCR
20.0000 meq | EXTENDED_RELEASE_TABLET | Freq: Two times a day (BID) | ORAL | Status: DC
  Administered 2023-01-08 – 2023-01-09 (×3): 20 meq via ORAL
  Filled 2023-01-08 (×3): qty 1

## 2023-01-08 MED ORDER — ACETAMINOPHEN 325 MG PO TABS: 325.0000 mg | ORAL_TABLET | ORAL | Status: DC | PRN

## 2023-01-08 NOTE — Progress Notes (Signed)
Inpatient Rehabilitation  Patient information reviewed and entered into eRehab system by Kelly Gentry, OTR/L, Rehab Quality Coordinator.   Information including medical coding, functional ability and quality indicators will be reviewed and updated through discharge.   

## 2023-01-08 NOTE — Evaluation (Addendum)
Physical Therapy Assessment and Plan  Patient Details  Name: JAHMIL YOKLEY MRN: [id number] Date of Birth: 15-Oct-1975  PT Diagnosis: Abnormal posture, Abnormality of gait, Difficulty walking, Edema, Impaired sensation, Low back pain, and Muscle weakness Rehab Potential: Good ELOS: 5-7 days   Today's Date: 01/08/2023 PT Individual Time: 0800-0920, 4098-1191 PT Individual Time Calculation (min): 80 min, 43 min   Hospital Problem: Principal Problem:   Ankle fracture Active Problems:   Localized edema   Past Medical History:  Past Medical History:  Diagnosis Date   Acute conjunctivitis of both eyes 10/22/2021   Acute sinusitis 06/16/2013   Alcoholic fatty liver 01/16/2010   Needs final HBV and HAV vaccines on or after 10/25/2012    Alcoholism (HCC) 12/25/2011   Allergic rhinitis    Childhood asthma    Elevated transaminase level 06/10/2007   AST: 80 ALT: 136 in 8/11: Hepatitis A., B and C negative.    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    Sleep apnea    Past Surgical History:  Past Surgical History:  Procedure Laterality Date   ESOPHAGEAL BANDING  12/26/2022   Procedure: ESOPHAGEAL BANDING;  Surgeon: Napoleon Form, MD;  Location: [facility] ENDOSCOPY;  Service: Gastroenterology;;   ESOPHAGOGASTRODUODENOSCOPY (EGD) WITH PROPOFOL N/A 07/05/2022   Procedure: ESOPHAGOGASTRODUODENOSCOPY (EGD) WITH PROPOFOL;  Surgeon: Napoleon Form, MD;  Location: [facility] ENDOS[facility];  Service: Gastroenterology;  Laterality: N/A;   ESOPHAGOGASTRODUODENOSCOPY (EGD) WITH PROPOFOL N/A 12/26/2022   Procedure: ESOPHAGOGASTRODUODENOSCOPY (EGD) WITH PROPOFOL;  Surgeon: Napoleon Form, MD;  Location: [facility] ENDOS[facility];  Service: Gastroenterology;  Laterality: N/A;   ORIF ANKLE FRACTURE Right 12/28/2022   Procedure: OPEN REDUCTION INTERNAL FIXATION (ORIF)  ANKLE FRACTURE;  Surgeon: Teryl Lucy, MD;  Location: [facility] OR;  Service: Orthopedics;  Laterality: Right;    Assessment & Plan Clinical Impression: Patient is a 47 y.o. year old male with  history of psoriatic arthritis, alcohol use, OSA, morbid obesity- BMI-47, Liver cirrhosis w/esophageal varices, hematemesis w/melena due to UGIB 1/24, closed bimalleolar ankle Fx 12/22/22 which was reduced and splinted in ED with recommendations for outpatient follow up. He was admitted on 12/25/22 N/V, hematemesis and anemia with hgb 7.0.  He was made NPO,etarted on octreotide,  IV PPI and transfused with one unit PRBC. Dr. Rosalia Hammers consulted and patient reported binging on whiskey 7/4-7/6 and being out of inderal/protonix for a month.  INR 1.5 at admission and he continued to have drop in Hgb on serial H/H checks. He underwent EGD revealing three columns of large esophageal varices with stigmata of recent bleed were eradicated with four bands and large clot from fundus was cleared. Endoscopy. He was kept on octreotide, IV Ceftriaxone X 5 days for SBP prophylaxis and carafate with recommendations to transfuse prn hgb <7.  Diet slowly advanced and octreotide transitioned to propranolol after 72 hours.     Dr. Dion Saucier consulted for input on ankle Fx and patient underwent ORIF right medial and lateral ankle with ORIF right syndesmosis on 07/21. Post op to be NWB  and to follow up with ortho in 2 weeks. H/H noted to be trending down and started on oral supplement today.  Has been on Lasix to manage BLE edema and thrombocytopenia being monitored.  Patient reports chronic lower back pain worsened with recent activities.  Patient said he is trying to use  Tylenol primarily for pain control.  Therapy has been working with patient who is limited by weakness, NWB LLE and requires min assist for mobility and ADLs. CIR recommended due to functional decline.         Patient currently requires  CGA  with mobility secondary to muscle  weakness, decreased cardiorespiratoy endurance, and decreased standing balance, decreased balance strategies, and difficulty maintaining precautions.  Prior to hospitalization, patient was independent  with mobility and lived with Family (dad lives with patient) in a House home.  Home access is can enter from garage 3 steps no rail, main entrance 4 brick steps no rail and bricks are uneven ; pt able to get a rampStairs to enter.  Patient will benefit from skilled PT intervention to maximize safe functional mobility, minimize fall risk, and decrease caregiver burden for planned discharge home with intermittent assist.  Anticipate patient will benefit from follow up OP at discharge.  PT - End of Session Activity Tolerance: Tolerates 30+ min activity without fatigue Endurance Deficit: Yes PT Assessment Rehab Potential (ACUTE/IP ONLY): Good PT Barriers to Discharge: Inaccessible home environment;Weight;Home environment access/layout;Decreased caregiver support;Wound Care;Lack of/limited family support;Weight bearing restrictions PT Patient demonstrates impairments in the following area(s): Balance;Edema;Endurance;Pain;Sensory PT Transfers Functional Problem(s): Bed Mobility;Bed to Chair;Car;Furniture PT Locomotion Functional Problem(s): Ambulation;Wheelchair Mobility PT Plan PT Intensity: Minimum of 1-2 x/day ,45 to 90 minutes PT Frequency: 5 out of 7 days PT Duration Estimated Length of Stay: 5-7 days PT Treatment/Interventions: Ambulation/gait training;DME/adaptive equipment instruction;UE/LE Strength taining/ROM;Balance/vestibular training;Skin care/wound management;UE/LE Coordination activities;Cognitive remediation/compensation;Functional mobility training;Splinting/orthotics;Visual/perceptual remediation/compensation;Community reintegration;Neuromuscular re-education;Stair training;Wheelchair propulsion/positioning;Discharge planning;Pain management;Therapeutic Activities;Disease  management/prevention;Patient/family education;Therapeutic Exercise PT Transfers Anticipated Outcome(s): mod I PT Locomotion Anticipated Outcome(s): mod I PT Recommendation Follow Up Recommendations: Outpatient PT Patient destination: Home Equipment Recommended: To be determined   PT Evaluation Precautions/Restrictions Precautions Precautions: Fall Required Braces or Orthoses: Splint/Cast Splint/Cast: R foot/ankle Restrictions RLE Weight Bearing: Non weight bearing Pain Interference   Home Living/Prior Functioning Home Living Living Arrangements: Parent Available Help at Discharge: Family Type of Home: House Home Access: Stairs to enter [employer] of Steps: can enter from garage 3 steps no rail, main entrance 4 brick steps no rail and bricks are uneven ; pt able to get a ramp Entrance Stairs-Rails: None Home Layout: Multi-level Alternate Level Stairs-Number of Steps: half bath on first floor no shower, was sleeping in recliner; shower is up 9-10 steps then landing then 6-7 steps to bathrooms; pt able to have room on 1st floor and access to half bath but would need to navigate flight of stairs to shower, Bathroom Shower/Tub: Tub/shower unit;Walk-in shower Bathroom Toilet: Standard Bathroom Accessibility: Yes Additional Comments: tub shower in the basement  Lives With: Family (dad lives with patient) Prior Function Level of Independence: Independent with basic ADLs;Independent with homemaking with ambulation;Independent with gait;Independent with transfers  Able to Take Stairs?: Yes Driving: Yes Vocation: Full time employment 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 Vision/Perception  Vision - History Ability to See in Adequate Light: 0 Adequate Perception Perception: Within Functional Limits Praxis Praxis: Intact  Cognition Overall Cognitive Status: Within Functional Limits for tasks assessed Attention: Divided Divided  Attention: Appears intact Memory: Appears intact Awareness: Appears intact Problem Solving: Appears intact Safety/Judgment: Appears intact Sensation Sensation Light Touch: Appears Intact (UE) Hot/Cold: Appears Intact Proprioception: Appears Intact Stereognosis: Appears Intact Coordination Gross Motor Movements are Fluid and Coordinated: No Fine Motor Movements are Fluid and Coordinated: Yes Coordination and Movement Description: limited by R  LE NWB Motor  Motor Motor: Within Functional Limits   Trunk/Postural Assessment  Cervical Assessment Cervical Assessment: Exceptions to [facility] (forward head) Thoracic Assessment Thoracic Assessment: Exceptions to [facility] (rounded shoulders) Lumbar Assessment Lumbar Assessment: Within Functional Limits Postural Control Postural Control: Deficits on evaluation  Balance Balance Balance Assessed: Yes Static Sitting Balance Static Sitting - Level of Assistance: 6: Modified independent (Device/Increase time) Dynamic Sitting Balance Dynamic Sitting - Level of Assistance: 5: Stand by assistance (supervision) Sitting balance - Comments: putting on L shoe Static Standing Balance Static Standing - Level of Assistance: 5: Stand by assistance (CGA) Dynamic Standing Balance Dynamic Standing - Balance Support: Bilateral upper extremity supported Dynamic Standing - Level of Assistance: 5: Stand by assistance (CGA) Extremity Assessment      RLE Assessment General Strength Comments: grossly at least 3/5, did not apply resistance 2/2 NWB precautions LLE Assessment LLE Assessment: Within Functional Limits  Care Tool Care Tool Bed Mobility Roll left and right activity   Roll left and right assist level: Supervision/Verbal cueing    Sit to lying activity   Sit to lying assist level: Supervision/Verbal cueing    Lying to sitting on side of bed activity   Lying to sitting on side of bed assist level: the ability to move from lying on the back to  sitting on the side of the bed with no back support.: Supervision/Verbal cueing     Care Tool Transfers Sit to stand transfer   Sit to stand assist level: Contact Guard/Touching assist    Chair/bed transfer   Chair/bed transfer assist level: Contact Guard/Touching assist     Toilet transfer        Car transfer    CGA       Care Tool Locomotion Ambulation   Assist level: Contact Guard/Touching assist Assistive device: Walker-rolling Max distance: 77 feet  Walk 10 feet activity   Assist level: Contact Guard/Touching assist Assistive device: Walker-rolling   Walk 50 feet with 2 turns activity   Assist level: Contact Guard/Touching assist Assistive device: Walker-rolling  Walk 150 feet activity Walk 150 feet activity did not occur: Safety/medical concerns      Walk 10 feet on uneven surfaces activity Walk 10 feet on uneven surfaces activity did not occur: Safety/medical concerns      Stairs Stair activity did not occur: Safety/medical concerns        Walk up/down 1 step activity Walk up/down 1 step or curb (drop down) activity did not occur: Safety/medical concerns      Walk up/down 4 steps activity Walk up/down 4 steps activity did not occur: Safety/medical concerns      Walk up/down 12 steps activity Walk up/down 12 steps activity did not occur: Safety/medical concerns      Pick up small objects from floor    CGA     Wheelchair Is the patient using a wheelchair?: Yes Type of Wheelchair: Manual   Wheelchair assist level: Supervision/Verbal cueing    Wheel 50 feet with 2 turns activity   Assist Level: Supervision/Verbal cueing  Wheel 150 feet activity   Assist Level: Supervision/Verbal cueing    Refer to Care Plan for Long Term Goals  SHORT TERM GOAL WEEK 1 PT Short Term Goal 1 (Week 1): STG=LTG 2/2 ELOS  Recommendations for other services: None   Skilled Therapeutic Intervention Mobility Bed Mobility Bed Mobility: Rolling Right;Rolling Left;Supine  to Sit;Sit to Supine Rolling Right: Supervision/verbal cueing Rolling Left: Supervision/Verbal cueing Supine to Sit: Supervision/Verbal cueing Sit to Supine:  Supervision/Verbal cueing Transfers Transfers: Sit to Stand;Stand to Sit;Stand Pivot Transfers Sit to Stand: Contact Guard/Touching assist Stand to Sit: Contact Guard/Touching assist Stand Pivot Transfers: Contact Guard/Touching assist Transfer (Assistive device): Rolling walker Locomotion  Gait Ambulation: Yes Gait Assistance: Contact Guard/Touching assist Gait Distance (Feet): 77 Feet Assistive device: Rolling walker Gait Gait: Yes Gait Pattern: Impaired Gait Pattern:  (hop to gait 2/2 R LE NWBing precautions) Gait velocity: Decreased velocity Stairs / Additional Locomotion Stairs: No Wheelchair Mobility Wheelchair Mobility: Yes Wheelchair Assistance: Doctor, general practice: Both upper extremities Wheelchair Parts Management: Needs assistance   Discharge Criteria: Patient will be discharged from PT if patient refuses treatment 3 consecutive times without medical reason, if treatment goals not met, if there is a change in medical status, if patient makes no progress towards goals or if patient is discharged from hospital.  The above assessment, treatment plan, treatment alternatives and goals were discussed and mutually agreed upon: by patient  Today's Interventions  Treatment Session 1  Pt supine in bed upon arrival. Pt agreeable to therapy. Pt reports 8/10 LBP, pt reports chronic. Pt denies any R LE pain. Notified MD and nursing of LBP and requested k pad.  Pt performed bed mobility with supervision, on flat surface with use of bed rails. Pt performed sit<>stand, stand pivot transfer WC to bed, and gait x77 feet with RW and CGA, verbal cues provided for UE positioning. Pt able to maintain precuations, pt demos recall of precautions.   Discussed home set up, pt has 3 steps with no rails for  home entry. Pt able and willing to stall temporary ramp for garage entry. Discussed pt bed room. Pt bedroom is currently on 2nd floor but able to move his stuff to main level where pt will have access to 1/2 bath. Pt reports showers are only accessible via stairs. Pt reports he is inquiring about a stair lift. Therapist provided education and demonstration of shower chair technique, pt reports the only method of stair negotiation he is willing to do until WBing precautions are lifted is a chair lift. Pt reports willingness to continue sponge bath until restrictions lifted. Pt reports he does not have any goals to ascend/descend stairs at this time.   Discussed need to purchase ramp.   Evaluation completed (see details above and below) with education on PT POC and goals and individual treatment initiated with focus on transfer training. Education on PT evaluation, CIR policies, and therapy schedule.   Pt seated in WC at end of session with all needs within reach and seatbelt alarm on.   Treatment Session 2   Pt seated in [facility] upon arrival with pt dad in room. Discussed ELOS 3-5 days with D/C likley 8/5. Pt verbalized understanding and agreeable.  Pt transported dependent in [facility] to ortho gym. Pt performed stand pivot transfer WC<>car simulator height of SUV with RW and supervision, verbal cues provided for safety with RW and UE positoioning, and being cautious of R LE with swinging leg in/out.   Pt descended ramp forwards with ascended ramp backwards in [facility] with CGA progressing to close supervision, verbal cues provided for technique, pt demos difficulty ascending. Discussed other option is ambulating up/down ramp with RW and hop to gait. Pt ascended ramp with RW and CGA with hop to gait, and descended with min A. Pt reports it is easier to ascend ramp with RW and descend ramp with WC. Discussed worse case scenario, pt could ascend ramp with RW and descend ramp with  WC. Discussed plan to continue to work on  both as well as strength and balance. Pt reports pt dad would be able to push empty wheelchair, and lift RW if pt were to choose that method.   Pt doffed leg rests with supervision, verbal cues provided for technique.   Pt self propelled WC ortho gym to room with mod I. Plan to practice donning and doffing legs rests.   Pt seated in [facility] with all needs within reach and seatbelt alarm on.      [facility] Tomas de Castro, [place], DPT  01/08/2023, 12:25 PM

## 2023-01-08 NOTE — Discharge Instructions (Addendum)
Inpatient Rehab Discharge Instructions  Thomas [name] Discharge date and time:  018/05/24  Activities/Precautions/ Functional Status: Activity: no lifting, driving, or strenuous exercise till cleared by MD Diet: regular diet--low to no salt Wound Care: keep wound clean and dry. Keep right leg elevated at all times--even in bed   Functional status:  ___ No restrictions     ___ Walk up steps independently ___ 24/7 supervision/assistance   ___ Walk up steps with assistance _X__ Intermittent supervision/assistance  _X__ Bathe/dress independently _X__ Walk with walker    ___ Bathe/dress with assistance ___ Walk Independently    ___ Shower independently ___ Walk with assistance    ___ Shower with assistance _X__ No alcohol     ___ Return to work/school ________    Special Instructions:     COMMUNITY REFERRALS UPON DISCHARGE:    Home Exercise Program until can weight bear then will need Outpatient Rehab                  Medical Equipment/Items Ordered: hospital bed, wheelchair, rolling walker and bedside commode                                                 Agency/Supplier:[employer]  [phone]  Patient has ordered himself a portable ramp for home to get up the three steps into his home  My questions have been answered and I understand these instructions. I will adhere to these goals and the provided educational materials after my discharge from the hospital.  Patient/Caregiver Signature _______________________________ Date __________  Clinician Signature _______________________________________ Date __________  Please bring this form and your medication list with you to all your follow-up doctor's appointments. \  Diet: As you were doing prior to hospitalization   Shower:  May shower but keep the wounds dry, use an occlusive plastic wrap, NO SOAKING IN TUB.  If the bandage gets wet, change with a clean dry gauze.  If you have a splint on, leave the splint in place and keep  the splint dry with a plastic bag.  Dressing:  You may change your dressing 3-5 days after surgery, unless you have a splint.  If you have a splint, then just leave the splint in place and we will change your bandages during your first follow-up appointment.    If you had hand or foot surgery, we will plan to remove your stitches in about 2 weeks in the office.  For all other surgeries, there are sticky tapes (steri-strips) on your wounds and all the stitches are absorbable.  Leave the steri-strips in place when changing your dressings, they will peel off with time, usually 2-3 weeks.  Activity:  Increase activity slowly as tolerated, but follow the weight bearing instructions below.  The rules on driving is that you can not be taking narcotics while you drive, and you must feel in control of the vehicle.    Weight Bearing:   No bearing weight on right leg  To prevent constipation: you may use a stool softener such as -  Colace (over the counter) 100 mg by mouth twice a day  Drink plenty of fluids (prune juice may be helpful) and high fiber foods Miralax (over the counter) for constipation as needed.    Itching:  If you experience itching with your medications, try taking only a single pain pill, or even  half a pain pill at a time.  You may take up to 10 pain pills per day, and you can also use benadryl over the counter for itching or also to help with sleep.   Precautions:  If you experience chest pain or shortness of breath - call 911 immediately for transfer to the hospital emergency department!!  If you develop a fever greater that 101 F, purulent drainage from wound, increased redness or drainage from wound, or calf pain -- Call the office at [phone]                                                Follow- Up Appointment:  Please call for an appointment to be seen in 2 weeks [place] - [phone]

## 2023-01-08 NOTE — Progress Notes (Signed)
Inpatient Rehabilitation Center Individual Statement of Services  [name]  Date:  01/08/2023  Welcome to the Inpatient Rehabilitation Center.  Our goal is to provide you with an individualized program based on your diagnosis and situation, designed to meet your specific needs.  With this comprehensive rehabilitation program, you will be expected to participate in at least 3 hours of rehabilitation therapies Monday-Friday, with modified therapy programming on the weekends.  Your rehabilitation program will include the following services:  Physical Therapy (PT), Occupational Therapy (OT), 24 hour per day rehabilitation nursing, Therapeutic Recreaction (TR), Care Coordinator, Rehabilitation Medicine, Nutrition Services, and Pharmacy Services  Weekly team conferences will be held on Wednesday to discuss your progress.  Your Inpatient Rehabilitation Care Coordinator will talk with you frequently to get your input and to update you on team discussions.  Team conferences with you and your family in attendance may also be held.  Expected length of stay: 5-6 days  Overall anticipated outcome: independent with device  Depending on your progress and recovery, your program may change. Your Inpatient Rehabilitation Care Coordinator will coordinate services and will keep you informed of any changes. Your Inpatient Rehabilitation Care Coordinator's name and contact numbers are listed  below.  The following services may also be recommended but are not provided by the Inpatient Rehabilitation Center:  Driving Evaluations Home Health Rehabiltiation Services Outpatient Rehabilitation Services Vocational Rehabilitation   Arrangements will be made to provide these services after discharge if needed.  Arrangements include referral to agencies that provide these services.  Your insurance has been verified to be:  [employer] Your primary doctor is:  Nadene Rubins  Pertinent information will be  shared with your doctor and your insurance company.  Inpatient Rehabilitation Care Coordinator:  Dossie Der, [place] [phone] or Luna Glasgow  Information discussed with and copy given to patient by: Lucy Chris, 01/08/2023, 9:46 AM

## 2023-01-08 NOTE — Plan of Care (Signed)
  Problem: RH Balance Goal: LTG Patient will maintain dynamic sitting balance (PT) Description: LTG:  Patient will maintain dynamic sitting balance with assistance during mobility activities (PT) Flowsheets (Taken 01/08/2023 1234) LTG: Pt will maintain dynamic sitting balance during mobility activities with:: Independent Goal: LTG Patient will maintain dynamic standing balance (PT) Description: LTG:  Patient will maintain dynamic standing balance with assistance during mobility activities (PT) Flowsheets (Taken 01/08/2023 1234) LTG: Pt will maintain dynamic standing balance during mobility activities with:: Independent with assistive device    Problem: Sit to Stand Goal: LTG:  Patient will perform sit to stand with assistance level (PT) Description: LTG:  Patient will perform sit to stand with assistance level (PT) Flowsheets (Taken 01/08/2023 1234) LTG: PT will perform sit to stand in preparation for functional mobility with assistance level: Independent with assistive device   Problem: RH Bed Mobility Goal: LTG Patient will perform bed mobility with assist (PT) Description: LTG: Patient will perform bed mobility with assistance, with/without cues (PT). Flowsheets (Taken 01/08/2023 1234) LTG: Pt will perform bed mobility with assistance level of: Independent with assistive device    Problem: RH Bed to Chair Transfers Goal: LTG Patient will perform bed/chair transfers w/assist (PT) Description: LTG: Patient will perform bed to chair transfers with assistance (PT). Flowsheets (Taken 01/08/2023 1234) LTG: Pt will perform Bed to Chair Transfers with assistance level: Independent with assistive device    Problem: RH Car Transfers Goal: LTG Patient will perform car transfers with assist (PT) Description: LTG: Patient will perform car transfers with assistance (PT). Flowsheets (Taken 01/08/2023 1234) LTG: Pt will perform car transfers with assist:: Set up assist    Problem: RH Ambulation Goal: LTG  Patient will ambulate in controlled environment (PT) Description: LTG: Patient will ambulate in a controlled environment, # of feet with assistance (PT). Flowsheets (Taken 01/08/2023 1234) LTG: Pt will ambulate in controlled environ  assist needed:: Independent with assistive device LTG: Ambulation distance in controlled environment: 50 feet Goal: LTG Patient will ambulate in home environment (PT) Description: LTG: Patient will ambulate in home environment, # of feet with assistance (PT). Flowsheets (Taken 01/08/2023 1234) LTG: Pt will ambulate in home environ  assist needed:: Independent with assistive device LTG: Ambulation distance in home environment: 25 feet   Problem: RH Wheelchair Mobility Goal: LTG Patient will propel w/c in controlled environment (PT) Description: LTG: Patient will propel wheelchair in controlled environment, # of feet with assist (PT) Flowsheets (Taken 01/08/2023 1234) LTG: Pt will propel w/c in controlled environ  assist needed:: Independent with assistive device LTG: Propel w/c distance in controlled environment: 150 Goal: LTG Patient will propel w/c in home environment (PT) Description: LTG: Patient will propel wheelchair in home environment, # of feet with assistance (PT). Flowsheets (Taken 01/08/2023 1234) LTG: Pt will propel w/c in home environ  assist needed:: Independent with assistive device LTG: Propel w/c distance in home environment: 100 feet

## 2023-01-08 NOTE — Patient Care Conference (Cosign Needed Addendum)
Inpatient RehabilitationTeam Conference and Plan of Care Update Date: 01/08/2023   Time: 15:00 PM    Patient Name: Thomas [name]      Medical Record Number: [id number]  Date of Birth: 1975-11-09 Sex: Male         Room/Bed: 4W07C/4W07C-01 Payor Info: Payor: [employer]UE [employer]UE [employer]IELD / Plan: BCBS COMM PPO / Product Type: *No Product type* /    Admit Date/Time:  01/07/2023  3:22 PM  Primary Diagnosis:  Ankle fracture  Hospital Problems: Principal Problem:   Ankle fracture Active Problems:   Vitamin D deficiency   Acute blood loss anemia   Cirrhosis of liver (HCC)   Secondary esophageal varices with bleeding (HCC)   Thrombocytopenia (HCC)   Localized edema   History of ETOH abuse    Expected Discharge Date: Expected Discharge Date: 01/12/23  Team Members Present: Physician leading conference: Dr. Fanny Dance Social Worker Present: Dossie Der, LCSW Nurse Present: Chana Bode, RN PT Present: Ambrose Finland, PT OT Present: Bretta Bang, OT     Current Status/Progress Goal Weekly Team Focus  Bowel/Bladder   Continent            Swallow/Nutrition/ Hydration               ADL's   Supervision/ verbal cueing  except Ted hose.   Mod I BADL   Education regardingequipment needs, adapted method for BADL, General strength and conditioning    Mobility   bed mobility = Mod I/ supervision; standing transfers = close supervision; ambulation = supervision with w/c follow for longer distances   Mod I overall  Likely d/c Mon 8/5; work on strengthening, activity tolerance, w/c mobility, ambulation, transfers    Communication                Safety/Cognition/ Behavioral Observations               Pain   N/A            Skin   Splint right ankle   Skin intact  Change splint ACE prn      Discharge Planning:  Home with dad who can be there and is retired. His sister and brother in-law will be checking on also. DME ordered and some has been delivered. Will  need ramp into home to be able to access it   Team Discussion: Doing well overall; weight bearing restrictions right LE for 6-8 weeks.  Patient on target to meet rehab goals: yes  *See Care Plan and progress notes for long and short-term goals.   Revisions to Treatment Plan:  N/a  Teaching Needs: Safety, skin care, medications, dietary modification, transfers, toileting, etc.  Current Barriers to Discharge: Decreased caregiver support and Home enviroment access/layout Once weight bearing precautions are lifted; plans to stay on level floor and do bird bathing in 1/2 bath on main level.  Possible Resolutions to Barriers: Family education DME: RW, [facility] and hospital bed has been ordered     Medical Summary Current Status: right ankle fracture s/p ORIF, lower back pain, anemia, LE edema  Barriers to Discharge: Medical stability;Morbid Obesity;Weight bearing restrictions  Barriers to Discharge Comments: right ankle fracture s/p ORIF, lower back pain, anemia, LE edema Possible Resolutions to [employer] Focus: recheck cbc,bmp, cont lasix, f/u with orthopedics for 2 week eval   Continued Need for Acute Rehabilitation Level of Care: The patient requires daily medical management by a physician with specialized training in physical medicine and rehabilitation for the following reasons:  Direction of a multidisciplinary physical rehabilitation program to maximize functional independence : Yes Medical management of patient stability for increased activity during participation in an intensive rehabilitation regime.: Yes Analysis of laboratory values and/or radiology reports with any subsequent need for medication adjustment and/or medical intervention. : Yes   I attest that I was present, lead the team conference, and concur with the assessment and plan of the team.   Chana Bode B 01/09/2023, 12:55 PM

## 2023-01-08 NOTE — Evaluation (Signed)
Occupational Therapy Assessment and Plan  Patient Details  Name: Thomas [name] MRN: [id number] Date of Birth: 02-09-1976  OT Diagnosis: acute pain, lumbago (low back pain), pain in joint, and swelling of limb Rehab Potential: Rehab Potential (ACUTE ONLY): Good ELOS: 3-5 days   Today's Date: 01/08/2023 OT Individual Time: 0950-1100 OT Individual Time Calculation (min): 70 min     Hospital Problem: Principal Problem:   Ankle fracture Active Problems:   Localized edema   Past Medical History:  Past Medical History:  Diagnosis Date   Acute conjunctivitis of both eyes 10/22/2021   Acute sinusitis 06/16/2013   Alcoholic fatty liver 01/16/2010   Needs final HBV and HAV vaccines on or after 10/25/2012    Alcoholism (HCC) 12/25/2011   Allergic rhinitis    Childhood asthma    Elevated transaminase level 06/10/2007   AST: 80 ALT: 136 in 8/11: Hepatitis A., B and C negative.    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    Sleep apnea    Past Surgical History:  Past Surgical History:  Procedure Laterality Date   ESOPHAGEAL BANDING  12/26/2022   Procedure: ESOPHAGEAL BANDING;  Surgeon: Napoleon Form, MD;  Location: [facility] ENDOSCOPY;  Service: Gastroenterology;;   ESOPHAGOGASTRODUODENOSCOPY (EGD) WITH PROPOFOL N/A 07/05/2022   Procedure: ESOPHAGOGASTRODUODENOSCOPY (EGD) WITH PROPOFOL;  Surgeon: Napoleon Form, MD;  Location: [facility] ENDOSCOPY;  Service: Gastroenterology;  Laterality: N/A;   ESOPHAGOGASTRODUODENOSCOPY (EGD) WITH PROPOFOL N/A 12/26/2022   Procedure: ESOPHAGOGASTRODUODENOSCOPY (EGD) WITH PROPOFOL;  Surgeon: Napoleon Form, MD;  Location: [facility] ENDOSCOPY;  Service: Gastroenterology;  Laterality: N/A;   ORIF ANKLE FRACTURE Right 12/28/2022   Procedure: OPEN REDUCTION INTERNAL FIXATION (ORIF) ANKLE FRACTURE;  Surgeon:  Teryl Lucy, MD;  Location: [facility] OR;  Service: Orthopedics;  Laterality: Right;    Assessment & Plan Clinical Impression: Patient is a 47 y.o. year old male with history of psoriatic arthritis, alcohol use, OSA, morbid obesity- BMI-47, Liver cirrhosis w/esophageal varices, hematemesis w/melena due to UGIB 1/24, closed bimalleolar ankle Fx 12/22/22 which was reduced and splinted in ED with recommendations for outpatient follow up. He was admitted on 12/25/22 N/V, hematemesis and anemia with hgb 7.0.  He was made NPO,s tarted on octreotide,  IV PPI and transfused with one unit PRBC. Dr. Rosalia Hammers consulted and patient reported binging on whiskey 7/4-7/6 and being out of inderal/protonix for a month.  He underwent EGD revealing three columns of large esophageal varices with stigmata of recent bleed were eradicated with four bands and large clot from fundus was cleared.   Patient underwent ORIF right medial and lateral ankle with ORIF right syndesmosis on 07/21. Post op to be NWB.  Has been on Lasix to manage BLE edema and thrombocytopenia being monitored.  Patient reports chronic lower back pain worsened with recent activities.  Patient said he is trying to use Tylenol primarily for pain control.  T  Patient transferred to CIR on 01/07/2023 .    Patient currently requires supervision with basic self-care skills secondary to muscle weakness and decreased standing balance and difficulty maintaining precautions.  Prior to hospitalization, patient could complete BADL/IADL with independent .  Patient will benefit from skilled intervention to decrease level of assist with basic self-care skills, increase independence with basic self-care skills, and increase level of independence with iADL prior to discharge home with care  partner.  Anticipate patient will require minimal physical assistance and no further OT follow recommended.  OT - End of Session Activity Tolerance: Decreased this session Endurance Deficit:  Yes OT Assessment Rehab Potential (ACUTE ONLY): Good OT Barriers to Discharge: Inaccessible home environment;Home environment access/layout OT Barriers to Discharge Comments: need to work out logistics of entry/exit house, access to bathrooms OT Patient demonstrates impairments in the following area(s): Balance;Pain;Edema;Endurance;Safety OT Basic ADL's Functional Problem(s): Bathing;Toileting;Dressing OT Transfers Functional Problem(s): Toilet;Tub/Shower OT Additional Impairment(s): None OT Plan OT Intensity: Minimum of 1-2 x/day, 45 to 90 minutes OT Frequency: 5 out of 7 days OT Duration/Estimated Length of Stay: 3-5 days OT Treatment/Interventions: Balance/vestibular training;Patient/family education;Self Care/advanced ADL retraining;Therapeutic Exercise;Wheelchair propulsion/positioning;Discharge planning;DME/adaptive equipment instruction;Functional mobility training;Pain management;Therapeutic Activities;UE/LE Strength taining/ROM OT Self Feeding Anticipated Outcome(s): Independent OT Basic Self-Care Anticipated Outcome(s): Modified Independent OT Toileting Anticipated Outcome(s): Modified Independent OT Bathroom Transfers Anticipated Outcome(s): Modified Independent OT Recommendation Patient destination: Home Follow Up Recommendations: None Equipment Recommended: To be determined   OT Evaluation Precautions/Restrictions  Precautions Precautions: Fall Required Braces or Orthoses: Splint/Cast Splint/Cast: R foot/ankle Restrictions RLE Weight Bearing: Non weight bearing   Pain Pain Assessment Pain Score: 4  Pain Type: Acute pain;Surgical pain Pain Location: Ankle Pain Orientation: Right Pain Descriptors / Indicators: Aching Pain Onset: On-going Patients Stated Pain Goal: 0 Multiple Pain Sites: No Home Living/Prior Functioning Home Living Living Arrangements: Parent Available Help at Discharge: Family Type of Home: House Home Access: Stairs to enter [employer] of Steps: can enter from garage 3 steps no rail, main entrance 4 brick steps no rail and bricks are uneven ; pt able to get a ramp Entrance Stairs-Rails: None Home Layout: Multi-level Alternate Level Stairs-Number of Steps: half bath on first floor no shower, was sleeping in recliner; shower is up 9-10 steps then landing then 6-7 steps to bathrooms; pt able to have room on 1st floor and access to half bath but would need to navigate flight of stairs to shower, Bathroom Shower/Tub: Tub/shower unit, Nurse, mental health Accessibility: Yes Additional Comments: tub shower in the basement  Lives With: Family (dad lives with patient) Prior Function Level of Independence: Independent with basic ADLs, Independent with homemaking with ambulation, Independent with gait, Independent with transfers  Able to Take Stairs?: Yes Driving: Yes Vocation: Full time employment 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 Vision Baseline Vision/History: 1 Wears glasses (blue filter for computer) Ability to See in Adequate Light: 0 Adequate Patient Visual Report: No change from baseline Vision Assessment?: No apparent visual deficits Perception  Perception: Within Functional Limits Praxis Praxis: Intact Cognition Cognition Overall Cognitive Status: Within Functional Limits for tasks assessed Orientation Level: Person;Place;Situation Person: Oriented Place: Oriented Situation: Oriented Memory: Appears intact Attention: Divided Divided Attention: Appears intact Awareness: Appears intact Problem Solving: Appears intact Safety/Judgment: Appears intact Brief Interview for Mental Status (BIMS) Repetition of Three Words (First Attempt): 3 Temporal Orientation: Year: Correct Temporal Orientation: Month: Accurate within 5 days Temporal Orientation: Day: Correct Recall: "Sock": Yes, no cue required Recall: "Blue": Yes, no cue  required Recall: "Bed": Yes, after cueing ("a piece of furniture") BIMS Summary Score: 14 Sensation Sensation Light Touch: Appears Intact (UE) Hot/Cold: Appears Intact Proprioception: Appears Intact Stereognosis: Appears Intact Coordination Gross Motor Movements are Fluid and Coordinated: No Fine Motor Movements are Fluid and Coordinated: Yes Coordination and Movement Description: limited by R LE NWB Motor  Motor Motor: Within Functional Limits  Trunk/Postural Assessment  Cervical Assessment  Cervical Assessment: Within Functional Limits Thoracic Assessment Thoracic Assessment: Within Functional Limits Lumbar Assessment Lumbar Assessment: Within Functional Limits Postural Control Postural Control: Within Functional Limits  Balance Balance Balance Assessed: Yes Static Sitting Balance Static Sitting - Level of Assistance: 6: Modified independent (Device/Increase time) Dynamic Sitting Balance Dynamic Sitting - Level of Assistance: 5: Stand by assistance (supervision) Sitting balance - Comments: putting on L shoe Static Standing Balance Static Standing - Level of Assistance: 5: Stand by assistance (CGA) Dynamic Standing Balance Dynamic Standing - Balance Support: Bilateral upper extremity supported Dynamic Standing - Level of Assistance: 5: Stand by assistance (CGA) Extremity/Trunk Assessment      Care Tool Care Tool Self Care Eating   Eating Assist Level: Independent    Oral Care    Oral Care Assist Level: Independent    Bathing   Body parts bathed by patient: Right arm;Left arm;Chest;Abdomen;Front perineal area;Right upper leg;Left upper leg;Right lower leg;Face;Buttocks   Body parts n/a: Left lower leg Assist Level: Supervision/Verbal cueing    Upper Body Dressing(including orthotics)   What is the patient wearing?: Pull over shirt   Assist Level: Independent    Lower Body Dressing (excluding footwear)   What is the patient wearing?: Pants Assist for lower  body dressing: Supervision/Verbal cueing    Putting on/Taking off footwear   What is the patient wearing?: Ted hose;Shoes Assist for footwear: Maximal Assistance - Patient 25 - 49%       Care Tool Toileting Toileting activity   Assist for toileting: Independent with assistive device     Care Tool Bed Mobility Roll left and right activity   Roll left and right assist level: Supervision/Verbal cueing    Sit to lying activity   Sit to lying assist level: Supervision/Verbal cueing    Lying to sitting on side of bed activity   Lying to sitting on side of bed assist level: the ability to move from lying on the back to sitting on the side of the bed with no back support.: Supervision/Verbal cueing     Care Tool Transfers Sit to stand transfer   Sit to stand assist level: Supervision/Verbal cueing    Chair/bed transfer   Chair/bed transfer assist level: Supervision/Verbal cueing     Toilet transfer   Assist Level: Supervision/Verbal cueing     Care Tool Cognition  Expression of Ideas and Wants Expression of Ideas and Wants: 4. Without difficulty (complex and basic) - expresses complex messages without difficulty and with speech that is clear and easy to understand  Understanding Verbal and Non-Verbal Content Understanding Verbal and Non-Verbal Content: 4. Understands (complex and basic) - clear comprehension without cues or repetitions   Memory/Recall Ability Memory/Recall Ability : Current season;That he or she is in a hospital/hospital unit   Refer to Care Plan for Long Term Goals  SHORT TERM GOAL WEEK 1 OT Short Term Goal 1 (Week 1): STG=LTG due to LOS  Recommendations for other services: None    Skilled Therapeutic Intervention:  Patient received seated in wheelchair agreeable to OT session, excited to take a shower.  Patient issued a long handled sponge this session.  Patient having difficulty reaching to feet while on shower bench. ADL status as indicated below.   Patient able to clearly articulate home set up and assist available.  Patient working on pain control by not taking anything stronger than Tylenol.  Left up in wheelchair with personal items and call bell in reach.  Patient aware to call for help and not  to get up unattended.   ADL ADL Equipment Provided: Long-handled sponge Eating: Independent Where Assessed-Eating: Chair Grooming: Independent Where Assessed-Grooming: Sitting at sink Upper Body Bathing: Independent Where Assessed-Upper Body Bathing: Shower Lower Body Bathing: Supervision/safety Where Assessed-Lower Body Bathing: Shower Upper Body Dressing: Independent Where Assessed-Upper Body Dressing: Chair Lower Body Dressing: Supervision/safety Where Assessed-Lower Body Dressing: Chair Toileting: Supervision/safety Where Assessed-Toileting: Teacher, adult education: Close supervision Toilet Transfer Method: Stand pivot;Ambulating Acupuncturist: Other (comment) Dan Humphreys) Tub/Shower Transfer: Unable to assess Tub/Shower Transfer Method: Unable to assess Film/video editor: Close supervision Film/video editor Method: Designer, industrial/product: Event organiser  Bed Mobility Bed Mobility: Rolling Right;Rolling Left;Supine to Sit;Sit to Supine Rolling Right: Supervision/verbal cueing Rolling Left: Supervision/Verbal cueing Supine to Sit: Supervision/Verbal cueing Sit to Supine: Supervision/Verbal cueing Transfers Sit to Stand: Contact Guard/Touching assist Stand to Sit: Contact Guard/Touching assist   Discharge Criteria: Patient will be discharged from OT if patient refuses treatment 3 consecutive times without medical reason, if treatment goals not met, if there is a change in medical status, if patient makes no progress towards goals or if patient is discharged from hospital.  The above assessment, treatment plan, treatment alternatives and goals were discussed and mutually agreed  upon: by patient  Collier Salina 01/08/2023, 1:02 PM

## 2023-01-08 NOTE — Progress Notes (Signed)
Inpatient Rehabilitation Admission Medication Review by a Pharmacist  A complete drug regimen review was completed for this patient to identify any potential clinically significant medication issues.  High Risk Drug Classes Is patient taking? Indication by Medication  Antipsychotic No   Anticoagulant No   Antibiotic No   Opioid Yes Oxycodone and tramadol for acute pain  Antiplatelet No   Hypoglycemics/insulin No   Vasoactive Medication Yes Propranolol for esophageal varices  Chemotherapy No   Other Yes Iron for supplement Furosemide for LE edema Pantoprazole for portal hypertensive gastropathy Acetaminophen for mild pain Maalox for indigestion Bisacodyl, fleet enema for constipation Cyclobenzaprine for muscle relaxer  Diphenhydramine for itching Robitussin for cough Trazodone for sleep     Type of Medication Issue Identified Description of Issue Recommendation(s)  Drug Interaction(s) (clinically significant)     Duplicate Therapy     Allergy     No Medication Administration End Date     Incorrect Dose     Additional Drug Therapy Needed     Significant med changes from prior encounter (inform family/care partners about these prior to discharge). Held home bupropion (was out of it for at least a month) and ibuprofen Home percocet switched to oxycodone Review at discharge   Other       Clinically significant medication issues were identified that warrant physician communication and completion of prescribed/recommended actions by midnight of the next day:  No  Name of provider notified for urgent issues identified:   Provider Method of Notification:     Pharmacist comments:   Time spent performing this drug regimen review (minutes):  15  Alphia Moh, PharmD, [place], [facility] Clinical Pharmacist  Please check AMION for all [facility] Pharmacy phone numbers After 10:00 PM, call Main Pharmacy [phone]

## 2023-01-08 NOTE — Plan of Care (Signed)
  Problem: RH Balance Goal: LTG Patient will maintain dynamic standing with ADLs (OT) Description: LTG:  Patient will maintain dynamic standing balance with assist during activities of daily living (OT)  Flowsheets (Taken 01/08/2023 1616) LTG: Pt will maintain dynamic standing balance during ADLs with: Independent with assistive device   Problem: Sit to Stand Goal: LTG:  Patient will perform sit to stand in prep for activites of daily living with assistance level (OT) Description: LTG:  Patient will perform sit to stand in prep for activites of daily living with assistance level (OT) Flowsheets (Taken 01/08/2023 1616) LTG: PT will perform sit to stand in prep for activites of daily living with assistance level: Independent with assistive device   Problem: RH Bathing Goal: LTG Patient will bathe all body parts with assist levels (OT) Description: LTG: Patient will bathe all body parts with assist levels (OT) Flowsheets (Taken 01/08/2023 1616) LTG: Pt will perform bathing with assistance level/cueing: Independent with assistive device    Problem: RH Dressing Goal: LTG Patient will perform lower body dressing w/assist (OT) Description: LTG: Patient will perform lower body dressing with assist, with/without cues in positioning using equipment (OT) Flowsheets (Taken 01/08/2023 1616) LTG: Pt will perform lower body dressing with assistance level of: Independent with assistive device   Problem: RH Toileting Goal: LTG Patient will perform toileting task (3/3 steps) with assistance level (OT) Description: LTG: Patient will perform toileting task (3/3 steps) with assistance level (OT)  Flowsheets (Taken 01/08/2023 1616) LTG: Pt will perform toileting task (3/3 steps) with assistance level: Independent with assistive device   Problem: RH Toilet Transfers Goal: LTG Patient will perform toilet transfers w/assist (OT) Description: LTG: Patient will perform toilet transfers with assist, with/without cues  using equipment (OT) Flowsheets (Taken 01/08/2023 1616) LTG: Pt will perform toilet transfers with assistance level of: Independent with assistive device   Problem: RH Tub/Shower Transfers Goal: LTG Patient will perform tub/shower transfers w/assist (OT) Description: LTG: Patient will perform tub/shower transfers with assist, with/without cues using equipment (OT) Flowsheets (Taken 01/08/2023 1616) LTG: Pt will perform tub/shower stall transfers with assistance level of: Independent with assistive device

## 2023-01-08 NOTE — Plan of Care (Signed)
  Problem: RH BOWEL ELIMINATION Goal: RH STG MANAGE BOWEL WITH ASSISTANCE Description: STG Manage Bowel with supervision Assistance. Outcome: Progressing   Problem: RH BLADDER ELIMINATION Goal: RH STG MANAGE BLADDER WITH ASSISTANCE Description: STG Manage Bladder With supervision Assistance Outcome: Progressing   Problem: RH SKIN INTEGRITY Goal: RH STG SKIN FREE OF INFECTION/BREAKDOWN Description: Incision to right foot will remain intact and free of infection/breakdown with min assist  Outcome: Progressing   Problem: RH SAFETY Goal: RH STG ADHERE TO SAFETY PRECAUTIONS W/ASSISTANCE/DEVICE Description: STG Adhere to Safety Precautions With cueing Assistance/Device. Outcome: Progressing   Problem: RH PAIN MANAGEMENT Goal: RH STG PAIN MANAGED AT OR BELOW PT'S PAIN GOAL Description: Pain managed 4 out of 10 on pain scale with PRN medications min assist  Outcome: Progressing   Problem: RH KNOWLEDGE DEFICIT GENERAL Goal: RH STG INCREASE KNOWLEDGE OF SELF CARE AFTER HOSPITALIZATION Description: Patient/caregiver will be able to manage medications and self care from nursing education and nursing handouts independently  Outcome: Progressing

## 2023-01-08 NOTE — Progress Notes (Signed)
Patient ID: AMAIR QUIST, male   DOB: Jun 04, 1976, 47 y.o.   MRN: [id number] Met with the patient to review current situation, rehab process, team conference and plan of care. Discussed secondary risk management including cirrhosis/ETOH use, Varices, GI bleed (Hgb 7.4) on Iron supplement.  Reviewed medications and dietary modification for increased protein intake and heart healthy diet. Splint right LE; aware of need to keep extremity elevated. Pain is managed. Continue to follow along to address educational needs to facilitate preparation for discharge. Pamelia Hoit

## 2023-01-08 NOTE — Progress Notes (Signed)
PROGRESS NOTE   Subjective/Complaints: Continues to have some lower back pain, this is a chronic issue but worsened with recent activities.  Heat is helping.  He has reports some pain in his fingers related to history of's psoriasis.  Previously on rheumatological treatment but he reports he discontinued this recently.  ROS: Patient denies fever, rash, sore throat, blurred vision, dizziness, nausea, vomiting, diarrhea, cough, shortness of breath or chest pain headache, or mood change.  + back and hand pain  Objective:   No results found. Recent Labs    01/06/23 2354 01/08/23 0606  WBC 3.6* 2.6*  HGB 7.4* 7.3*  HCT 24.6* 24.8*  PLT 129* 125*   Recent Labs    01/08/23 0606  NA 138  K 3.6  CL 103  CO2 28  GLUCOSE 90  BUN 9  CREATININE 0.83  CALCIUM 8.0*    Intake/Output Summary (Last 24 hours) at 01/08/2023 1003 Last data filed at 01/08/2023 0620 Gross per 24 hour  Intake --  Output 400 ml  Net -400 ml        Physical Exam: Vital Signs Blood pressure 110/61, pulse 62, temperature 97.8 F (36.6 C), temperature source Oral, resp. rate 16, height 5\' 5"  (1.651 m), weight 127.3 kg, SpO2 100%.    General: No acute distress, working with therapy at edge of bed HEENT: [place]/AT,MMM Neck: Supple without JVD or lymphadenopathy Heart: Reg rate and rhythm. No murmurs rubs or gallops Chest: CTA bilaterally without wheezes, rales, or rhonchi; no distress Abdomen: Soft, non-tender, non-distended, bowel sounds positive. Extremities: Edema 1+ left lower extremity, splint in place right lower extremity Psych: Pt's affect is appropriate. Pt is cooperative Skin: Clean and intact without signs of breakdown Neuro: Alert and oriented x 4, follows commands, cranial nerves II through XII intact, normal speech and language Strength 5 out of 5 in bilateral upper extremities and left lower extremity Strength antigravity in resistance and  right hip flexion and knee extension, able to wiggle toes in the right foot Sensation intact to light touch in all 4 extremities and toes on the right foot No ataxia or dysmetria noted Musculoskeletal:  No hypertonia Good sitting balance Mild tenderness along his hands    Assessment/Plan: 1. Functional deficits which require 3+ hours per day of interdisciplinary therapy in a comprehensive inpatient rehab setting. Physiatrist is providing close team supervision and 24 hour management of active medical problems listed below. Physiatrist and rehab team continue to assess barriers to discharge/monitor patient progress toward functional and medical goals  Care Tool:  Bathing              Bathing assist       Upper Body Dressing/Undressing Upper body dressing        Upper body assist      Lower Body Dressing/Undressing Lower body dressing            Lower body assist       Toileting Toileting    Toileting assist       Transfers Chair/bed transfer  Transfers assist     Chair/bed transfer assist level: Contact Guard/Touching assist     Locomotion Ambulation   Ambulation assist  Walk 10 feet activity   Assist           Walk 50 feet activity   Assist           Walk 150 feet activity   Assist           Walk 10 feet on uneven surface  activity   Assist           Wheelchair     Assist               Wheelchair 50 feet with 2 turns activity    Assist            Wheelchair 150 feet activity     Assist          Blood pressure 110/61, pulse 62, temperature 97.8 F (36.6 C), temperature source Oral, resp. rate 16, height 5\' 5"  (1.651 m), weight 127.3 kg, SpO2 100%.  Medical Problem List and Plan: 1. Functional deficits secondary to right ankle fracture s/p ORIF bimalleolar ankle fracture with fixation, right syndesmosis by Dr. Dion Saucier on 12/28/2022         and upper GI bleed  patient -patient may  shower if right lower extremity is Covered             -ELOS/Goals: 7 to 10 days, supervision with PT, supervision with OT             -Continue CIR/evaluations today             -Follow-up with orthopedics around 01/11/2023             -Nonweightbearing right lower extremity, elevate right lower extremity when in bed 2.  Antithrombotics: -DVT/anticoagulation:  Mechanical: Sequential compression devices, below knee Bilateral lower extremities -Avoid pharmacological DVT prophylaxis due to variceal bleeding             -antiplatelet therapy: N/A  3. Pain Management:  Tylenol or oxycodone prn. Ultram added for moderate pain prn.   -8/1 heating pad for lower back pain, discussed trying his muscle relaxer Flexeril 4. Mood/Behavior/Sleep: LCSW to follow for evaluation and support.             --trazodone prn for insomnia.  Has been sleeping from 9p- 5a (getting used to hospital)             -antipsychotic agents:  N/A 5. Neuropsych/cognition: This patient is capable of making decisions on his own behalf. 6. Skin/Wound Care: Routine pressure relief  7. Fluids/Electrolytes/Nutrition: Monitor I/O. Check CMET in am 8. Displaced Right bimalleolar ankle Fx s/p ORIF 12/28/22: NWB with splint.  9. UGIB s/p esophageal varices banding: Continue to monitor H/H as trending 7.2- 7.7 range.  Continue propranolol. 10. Cirrhosis of the Liver: Continue inderal and PPI.  Continue Lasix.  Monitor for encephalopathy/hematemesis/melena.              --father had hepatitis w/cirrhosis of liver/mother had liver disease.              --continue to educate on importance of ETOH abstinence (ready to quit) 11. Acute on chronic anemia: Started on iron supplement.    -8/1 Hgb stable at 7.3 12. Thrombocytopenia: Monitor for signs of recurrent bleeding.    -8/1 stable at 125 13. BLE edema: Continue Lasix bid. Elevate BLE when seated. TED on RLE             --low salt diet. Monitor for need of K supplement.   14. Morbid obesity: dietary  counseling  Body mass index is 47.43 kg/m. 15.  Psoriasis  -Patient reporting some discomfort in his hands, will start with Voltaren gel.  Consider additional medication if does not improve  LOS: 1 days A FACE TO FACE EVALUATION WAS PERFORMED  Fanny Dance 01/08/2023, 10:03 AM

## 2023-01-08 NOTE — Progress Notes (Signed)
Inpatient Rehabilitation Care Coordinator Assessment and Plan Patient Details  Name: Thomas [name] MRN: [id number] Date of Birth: 1976/03/20  Today's Date: 01/08/2023  Hospital Problems: Principal Problem:   Ankle fracture Active Problems:   Localized edema  Past Medical History:  Past Medical History:  Diagnosis Date   Acute conjunctivitis of both eyes 10/22/2021   Acute sinusitis 06/16/2013   Alcoholic fatty liver 01/16/2010   Needs final HBV and HAV vaccines on or after 10/25/2012    Alcoholism (HCC) 12/25/2011   Allergic rhinitis    Childhood asthma    Elevated transaminase level 06/10/2007   AST: 80 ALT: 136 in 8/11: Hepatitis A., B and C negative.    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    Sleep apnea    Past Surgical History:  Past Surgical History:  Procedure Laterality Date   ESOPHAGEAL BANDING  12/26/2022   Procedure: ESOPHAGEAL BANDING;  Surgeon: Napoleon Form, MD;  Location: [facility] ENDOSCOPY;  Service: Gastroenterology;;   ESOPHAGOGASTRODUODENOSCOPY (EGD) WITH PROPOFOL N/A 07/05/2022   Procedure: ESOPHAGOGASTRODUODENOSCOPY (EGD) WITH PROPOFOL;  Surgeon: Napoleon Form, MD;  Location: [facility] ENDOS[facility];  Service: Gastroenterology;  Laterality: N/A;   ESOPHAGOGASTRODUODENOSCOPY (EGD) WITH PROPOFOL N/A 12/26/2022   Procedure: ESOPHAGOGASTRODUODENOSCOPY (EGD) WITH PROPOFOL;  Surgeon: Napoleon Form, MD;  Location: [facility] ENDOS[facility];  Service: Gastroenterology;  Laterality: N/A;   ORIF ANKLE FRACTURE Right 12/28/2022   Procedure: OPEN REDUCTION INTERNAL FIXATION (ORIF) ANKLE FRACTURE;  Surgeon: Teryl Lucy, MD;  Location: [facility] OR;  Service: Orthopedics;  Laterality: Right;   Social History:  reports that he has been smoking cigars. He has never used smokeless tobacco. He reports that he does not currently use  alcohol. He reports that he does not use drugs.  Family / Support Systems Marital Status: Single Patient Roles: Other (Comment) (son, sibling and employee) Other Supports: Cassell Smiles  [phone]  Michelle-sister [phone] Anticipated Caregiver: Dad and sister Ability/Limitations of Caregiver: Dad is elderly but can be there.  His sister and brother in-law will come by and check on both Caregiver Availability: 24/7 Family Dynamics: Close with sibling and father, he has good supports and feels he will have his needs met until he recovers from his ankle fracture  Social History Preferred language: English Religion: None Cultural Background: hispanic speaks fluent English Education: Some college Health Literacy - How often do you need to have someone help you when you read instructions, pamphlets, or other written material from your doctor or pharmacy?: Never Writes: Yes Employment Status: Employed Name of Employer: [employer] works from home currently working while here Return to Work Plans: 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 He is glad to have come here knows he will get the therapies he needs. He does not want to burden his dad. He has always been independent and taken care of  himself. Recent Psychosocial Issues: other health issues Psychiatric History: No history seems to be coping  appropriately and verbalizes his feelings and concerns. Will be short length of stay due to high level and NWB only so much to work on Substance Abuse History: Hx-ETOH has quit but was smoking cigars aware of the health issues and will try to quit  Patient / Family Perceptions, Expectations & Goals Pt/Family understanding of illness & functional limitations: Pt has a good understanding of his ankle fracture and GI bleed. He does speak with the rounding MD and feels his questions and concerns are being addressed. Premorbid pt/family roles/activities: son, sibling, employee, friend, home owner, etc Anticipated changes in roles/activities/participation: resume Pt/family expectations/goals: Pt states: " I hope to do better wiht transfers so I feel better prepared to go home with my Dad."  [employer]: None Premorbid Home Care/DME Agencies: Other (Comment) (hospital bed, wc, rw, bsc ordered whole on acute and delivered) Transportation available at discharge: sibling and Dad Is the patient able to respond to transportation needs?: Yes In the past 12 months, has lack of transportation kept you from medical appointments or from getting medications?: No In the past 12 months, has lack of transportation kept you from meetings, work, or from getting things needed for daily living?: No  Discharge Planning Living Arrangements: Parent Support Systems: Parent, Other relatives, Friends/neighbors Type of Residence: Private residence Insurance Resources: Media planner (specify) Herbalist) Financial Resources: Employment Surveyor, quantity Screen Referred: No Living Expenses: Banker Management: Patient Does the patient have any problems obtaining your medications?: No Home Management: both pt and dad Patient/Family Preliminary Plans: Return home with father whom lives with him. He works from home and continues to work while here. His sister and brother in-law are local and will be  checking on both of them at home. Aware will be short length of stay due to NWB and already at [facility] level. Care Coordinator Barriers to Discharge: Decreased caregiver support, Insurance for SNF coverage Care Coordinator Anticipated Follow Up Needs: HH/OP  Clinical Impression Pleasant gentleman who is motivated to do well and return home with Dad. He is aware it will take time for his ankle to heal and he will need to be patient. He is glad he came to rehab to get stronger and better with his transfers. Await therapy evaluations and work on discharge needs.  Lucy Chris 01/08/2023, 9:44 AM

## 2023-01-09 DIAGNOSIS — F1011 Alcohol abuse, in remission: Secondary | ICD-10-CM | POA: Insufficient documentation

## 2023-01-09 LAB — CBC
HCT: 25.5 % — ABNORMAL LOW (ref 39.0–52.0)
Hemoglobin: 7.5 g/dL — ABNORMAL LOW (ref 13.0–17.0)
MCH: 23.4 pg — ABNORMAL LOW (ref 26.0–34.0)
MCHC: 29.4 g/dL — ABNORMAL LOW (ref 30.0–36.0)
MCV: 79.4 fL — ABNORMAL LOW (ref 80.0–100.0)
Platelets: 127 10*3/uL — ABNORMAL LOW (ref 150–400)
RBC: 3.21 MIL/uL — ABNORMAL LOW (ref 4.22–5.81)
RDW: 18.7 % — ABNORMAL HIGH (ref 11.5–15.5)
WBC: 2.9 10*3/uL — ABNORMAL LOW (ref 4.0–10.5)
nRBC: 0 % (ref 0.0–0.2)

## 2023-01-09 LAB — BASIC METABOLIC PANEL
Anion gap: 7 (ref 5–15)
BUN: 11 mg/dL (ref 6–20)
CO2: 24 mmol/L (ref 22–32)
Calcium: 8.1 mg/dL — ABNORMAL LOW (ref 8.9–10.3)
Chloride: 108 mmol/L (ref 98–111)
Creatinine, Ser: 0.9 mg/dL (ref 0.61–1.24)
GFR, Estimated: >60 mL/min (ref >60)
Glucose, Bld: 105 mg/dL — ABNORMAL HIGH (ref 70–99)
Potassium: 3.7 mmol/L (ref 3.5–5.1)
Sodium: 139 mmol/L (ref 135–145)

## 2023-01-09 MED ORDER — FUROSEMIDE 40 MG PO TABS
40.0000 mg | ORAL_TABLET | Freq: Every day | ORAL | Status: DC
  Administered 2023-01-10 – 2023-01-12 (×3): 40 mg via ORAL
  Filled 2023-01-09 (×3): qty 1

## 2023-01-09 MED ORDER — POTASSIUM CHLORIDE CRYS ER 20 MEQ PO TBCR
20.0000 meq | EXTENDED_RELEASE_TABLET | Freq: Every day | ORAL | Status: DC
  Administered 2023-01-10 – 2023-01-12 (×3): 20 meq via ORAL
  Filled 2023-01-09 (×3): qty 1

## 2023-01-09 NOTE — Progress Notes (Signed)
Occupational Therapy Session Note  Patient Details  Name: Thomas [name] MRN: [id number] Date of Birth: [birth year]  Today's Date: 01/09/2023 OT Individual Time: 1400-1500 OT Individual Time Calculation (min): 60 min    Short Term Goals: Week 1:  OT Short Term Goal 1 (Week 1): STG=LTG due to LOS  Skilled Therapeutic Interventions/Progress Updates:    1:1 Pt ambualted into the bathroom with supervision. Performed toileting mod I sit to stand with RW. Recommended when standing to void to stand with RW over the toilet for UE support to be able to maintain weight bearing precautions. Pt transitioned over the the shower (tub bench to bathe) with lower leg covered.  Pt able to bathe at shower level with setup. Pt dressed in the shower with setup. Pt ambulated back to the bed with distant supervision with RW and transferred back into bed mod I.  TED donned on left Le with total A. Recommended elevating left Le on pillows- helped execute this.  LEft resting in bed with call bell.   Therapy Documentation Precautions:  Precautions Precautions: Fall Required Braces or Orthoses: Splint/Cast Splint/Cast: R foot/ankle Restrictions Weight Bearing Restrictions: Yes RLE Weight Bearing: Non weight bearing General:   Vital Signs:   Pain: No c/o pain   Therapy/Group: Individual Therapy  Roney Mans [facility] 01/09/2023, 2:15 PM

## 2023-01-09 NOTE — Progress Notes (Addendum)
PROGRESS NOTE   Subjective/Complaints: No new concerns this AM. Flexeril helped with back pain yesterday. No new concerns.   ROS: Patient denies fever, chills, rash, sore throat, blurred vision, dizziness, nausea, vomiting, diarrhea, cough, shortness of breath or chest pain headache, or mood change.  + back pain- chronic  Objective:   No results found. Recent Labs    01/06/23 2354 01/08/23 0606  WBC 3.6* 2.6*  HGB 7.4* 7.3*  HCT 24.6* 24.8*  PLT 129* 125*   Recent Labs    01/08/23 0606  NA 138  K 3.6  CL 103  CO2 28  GLUCOSE 90  BUN 9  CREATININE 0.83  CALCIUM 8.0*    Intake/Output Summary (Last 24 hours) at 01/09/2023 1144 Last data filed at 01/09/2023 0719 Gross per 24 hour  Intake 840 ml  Output --  Net 840 ml        Physical Exam: Vital Signs Blood pressure 123/60, pulse 66, temperature 98 F (36.7 C), temperature source Oral, resp. rate 16, height 5\' 5"  (1.651 m), weight 127.3 kg, SpO2 98%.    General: No acute distress, working with therapy in gym HEENT: [place]/AT,MMM Neck: Supple without JVD or lymphadenopathy Heart: Reg rate and rhythm. No murmurs rubs or gallops Chest: CTA bilaterally without wheezes, rales, or rhonchi; no distress Abdomen: Soft, non-tender, non-distended, bowel sounds positive. Extremities: Edema 1+ left lower extremity, splint in place right lower extremity Psych: Pt's affect is appropriate. Pt is cooperative Skin: Clean and intact without signs of breakdown Neuro: Alert and oriented x 4, follows commands, cranial nerves II through XII intact, normal speech and language Moving  bilateral upper extremities and left lower extremity to gravity and resistance Strength antigravity in resistance and right hip flexion and knee extension, able to wiggle toes in the right foot Sensation intact to light touch in all 4 extremities and toes on the right foot No ataxia or dysmetria  noted Musculoskeletal:  Good sitting balance Mild tenderness along his hands    Assessment/Plan: 1. Functional deficits which require 3+ hours per day of interdisciplinary therapy in a comprehensive inpatient rehab setting. Physiatrist is providing close team supervision and 24 hour management of active medical problems listed below. Physiatrist and rehab team continue to assess barriers to discharge/monitor patient progress toward functional and medical goals  Care Tool:  Bathing    Body parts bathed by patient: Right arm, Left arm, Chest, Abdomen, Front perineal area, Right upper leg, Left upper leg, Right lower leg, Face, Buttocks     Body parts n/a: Left lower leg   Bathing assist Assist Level: Supervision/Verbal cueing     Upper Body Dressing/Undressing Upper body dressing   What is the patient wearing?: Pull over shirt    Upper body assist Assist Level: Independent    Lower Body Dressing/Undressing Lower body dressing      What is the patient wearing?: Pants     Lower body assist Assist for lower body dressing: Supervision/Verbal cueing     Toileting Toileting    Toileting assist Assist for toileting: Independent with assistive device     Transfers Chair/bed transfer  Transfers assist     Chair/bed transfer assist  level: Supervision/Verbal cueing     Locomotion Ambulation   Ambulation assist      Assist level: Contact Guard/Touching assist Assistive device: Walker-rolling Max distance: 77 feet   Walk 10 feet activity   Assist     Assist level: Contact Guard/Touching assist Assistive device: Walker-rolling   Walk 50 feet activity   Assist    Assist level: Contact Guard/Touching assist Assistive device: Walker-rolling    Walk 150 feet activity   Assist Walk 150 feet activity did not occur: Safety/medical concerns         Walk 10 feet on uneven surface  activity   Assist     Assist level: Contact Guard/Touching  assist     Wheelchair     Assist Is the patient using a wheelchair?: Yes Type of Wheelchair: Manual    Wheelchair assist level: Supervision/Verbal cueing      Wheelchair 50 feet with 2 turns activity    Assist        Assist Level: Supervision/Verbal cueing   Wheelchair 150 feet activity     Assist      Assist Level: Supervision/Verbal cueing   Blood pressure 123/60, pulse 66, temperature 98 F (36.7 C), temperature source Oral, resp. rate 16, height 5\' 5"  (1.651 m), weight 127.3 kg, SpO2 98%.  Medical Problem List and Plan: 1. Functional deficits secondary to right ankle fracture s/p ORIF bimalleolar ankle fracture with fixation, right syndesmosis by Dr. Dion Saucier on 12/28/2022         and upper GI bleed patient -patient may  shower if right lower extremity is Covered             -ELOS/Goals: 7 to 10 days, supervision with PT, supervision with OT             -Continue CIR/evaluations today             -Follow-up with orthopedics around 01/11/2023             -Nonweightbearing right lower extremity, elevate right lower extremity when in bed 2.  Antithrombotics: -DVT/anticoagulation:  Mechanical: Sequential compression devices, below knee Bilateral lower extremities -Avoid pharmacological DVT prophylaxis due to variceal bleeding             -antiplatelet therapy: N/A  3. Pain Management:  Tylenol or oxycodone prn. Ultram added for moderate pain prn.   -8/1 heating pad for lower back pain, discussed trying his muscle  relaxer Flexeril 8/2 Rare use of oxycodone, continue flexeril as needed 4. Mood/Behavior/Sleep: LCSW to follow for evaluation and support.             --trazodone prn for insomnia.  Has been sleeping from 9p- 5a (getting used to hospital)             -antipsychotic agents:  N/A 5. Neuropsych/cognition: This patient is capable of making decisions on his own behalf. 6. Skin/Wound Care: Routine pressure relief  7. Fluids/Electrolytes/Nutrition: Monitor  I/O. Check CMET in am 8. Displaced Right bimalleolar ankle Fx s/p ORIF 12/28/22: NWB with splint.  9. UGIB s/p esophageal varices banding: Continue to monitor H/H as trending 7.2- 7.7 range.  Continue propranolol. 10. Cirrhosis of the Liver: Continue inderal and PPI.  Continue Lasix.  Monitor for encephalopathy/hematemesis/melena.              --father had hepatitis w/cirrhosis of liver/mother had liver disease.              --continue to educate on importance of ETOH abstinence (  ready to quit) 11. Acute on chronic anemia: Started on iron supplement.    -8/1 Hgb stable at 7.3  Recheck today 12. Thrombocytopenia: Monitor for signs of recurrent bleeding.    -8/1 stable at 125 13. BLE edema: Continue Lasix bid. Elevate BLE when seated. TED on RLE             --low salt diet. Monitor for need of K supplement.   -Recheck BMP today  -8/2 lasix decreased to daily, K+ 20eq daily 14. Morbid obesity: dietary counseling  Body mass index is 47.43 kg/m. 15.  Psoriasis  -Patient reporting some discomfort in his hands, will start with Voltaren gel.  Consider additional medication if does not improve  LOS: 2 days A FACE TO FACE EVALUATION WAS PERFORMED  Fanny Dance 01/09/2023, 11:44 AM

## 2023-01-09 NOTE — Plan of Care (Signed)
  Problem: RH BOWEL ELIMINATION Goal: RH STG MANAGE BOWEL WITH ASSISTANCE Description: STG Manage Bowel with supervision Assistance. Outcome: Progressing   Problem: RH BLADDER ELIMINATION Goal: RH STG MANAGE BLADDER WITH ASSISTANCE Description: STG Manage Bladder With supervision Assistance Outcome: Progressing   Problem: RH SKIN INTEGRITY Goal: RH STG SKIN FREE OF INFECTION/BREAKDOWN Description: Incision to right foot will remain intact and free of infection/breakdown with min assist  Outcome: Progressing Goal: RH STG MAINTAIN SKIN INTEGRITY WITH ASSISTANCE Description: STG Maintain Skin Integrity With min Assistance. Outcome: Progressing   Problem: RH SAFETY Goal: RH STG ADHERE TO SAFETY PRECAUTIONS W/ASSISTANCE/DEVICE Description: STG Adhere to Safety Precautions With cueing Assistance/Device. Outcome: Progressing   Problem: RH PAIN MANAGEMENT Goal: RH STG PAIN MANAGED AT OR BELOW PT'S PAIN GOAL Description: Pain managed 4 out of 10 on pain scale with PRN medications min assist  Outcome: Progressing

## 2023-01-09 NOTE — Progress Notes (Addendum)
Patient ID: Thomas [name], male   DOB: 03/16/1976, 47 y.o.   MRN: [id number] Team feels pt will be ready for discharge 8/5. MD feels will be medically ready for discharge. Will check on hospital bed since he said was ordered while on acute. Have reached out to [facility] regarding the bed delivery and order if has all needed paperwork. Will check regarding follow up being NWB not much can do for him until can WB. Await team's recommendations  11:13 AM Have emailed order for hospital bed to [facility]. He will follow up with pt regarding delivery of the bed. Pt is working on getting himself a portable ramp to access his home.

## 2023-01-09 NOTE — Discharge Summary (Signed)
Physician Discharge Summary  Patient ID: Thomas [name] MRN: [id number] DOB/AGE: March 13, 1976 47 y.o.  Admit date: 01/07/2023 Discharge date: 01/12/2023  Discharge Diagnoses:  Principal Problem:   Ankle fracture Active Problems:   Acute blood loss anemia   Cirrhosis of liver (HCC)   Secondary esophageal varices with bleeding (HCC)   Thrombocytopenia (HCC)   Localized edema   History of ETOH abuse   Discharged Condition: stable  Significant Diagnostic Studies: DG Ankle Right Port  Result Date: 01/10/2023 CLINICAL DATA:  Closed right ankle fracture. EXAM: PORTABLE RIGHT ANKLE - 2 VIEW COMPARISON:  Right ankle radiographs 12/28/2022 FINDINGS: There is again overlying casting material that limits evaluation of fine bony detail. Redemonstration of ORIF of distal fibular and medial malleolar fractures including plate and screw fixation of the distal fibula and a screw traversing the medial malleolus. Additional distal tibiofibular syndesmosis tight rope fixation. The ankle mortise remains symmetric and intact. IMPRESSION: Redemonstration of ORIF of distal fibular and medial malleolar fractures with unchanged normal alignment. Electronically Signed   By: Neita Garnet M.D.   On: 01/10/2023 16:01    Labs:  Basic Metabolic Panel: Recent Labs  Lab 01/08/23 0606 01/09/23 1239 01/12/23 0613  NA 138 139 142  K 3.6 3.7 3.7  CL 103 108 107  CO2 28 24 27   GLUCOSE 90 105* 127*  BUN 9 11 12   CREATININE 0.83 0.90 0.81  CALCIUM 8.0* 8.1* 8.3*    CBC: Recent Labs  Lab 01/08/23 0606 01/09/23 1239 01/12/23 0613  WBC 2.6* 2.9* 2.8*  NEUTROABS 1.1*  --   --   HGB 7.3* 7.5* 7.5*  HCT 24.8* 25.5* 25.8*  MCV 78.5* 79.4* 78.4*  PLT 125* 127* 123*    CBG: No results for input(s): "GLUCAP" in the last 168 hours.  Brief HPI:   Thomas [name] is a 47 y.o. male with history of psoriatic arthritis, alcohol use, morbid obesity-BMI 41, liver cirrhosis with esophageal varices, upper GI bleed who  sustained a closed bimalleolar ankle fracture on 12/22/2022 which was reduced and splinted in ED with recommendations for outpatient follow-up.  He was admitted via ED on 07/18 6/24 with nausea vomiting, hematemesis and anemia with hemoglobin of 7.0.  He was treated with octreotide, IV PPI and transfused with 1 unit PRBC.  Patient reported binging on whiskey from 7/04-07/06 due to recent break-up with his girlfriend as well as being out of Inderal and Protonix for about a month.  He continued have drop in hemoglobin and underwent EGD revealing 3 columns of large esophageal varices with stigmata of recent bleed which were eradicated with 4 bands and large clot from fundus was cleared.    Dr. Dion Saucier was consulted for input on ankle fracture and patient underwent ORIF right medial and lateral ankle fracture with ORIF of right syndesmosis on 07/21.  Postop to be nonweightbearing and follow-up with Ortho in 2 weeks.  He was started on Lasix to manage BLE edema and thrombocytopenia was being monitored.  Therapy was working with patient was limited by weakness, nonweightbearing status and he was noted to require min assist with mobility and ADLs.  Patient was independent for admission.  CIR was recommended due to functional decline.   Hospital Course: Thomas [name] was admitted to rehab 01/07/2023 for inpatient therapies to consist of PT and OT at least three hours five days a week. Past admission physiatrist, therapy team and rehab RN have worked together to provide customized collaborative inpatient rehab.  His blood  pressures were monitored on TID basis and have been stable.  K Dur  was added for supplement due to diuretics on board.  As peripheral edema was improving Lasix was decreased to once a day and he has been instructed on low-salt diet as well as elevation of lower extremities to help with edema control.  Follow-up check of electrolytes renal status and potassium levels to be within normal limits.   Follow-up CBC shows H&H and platelets to be relatively stable.    Protein supplements were added due to evidence of malnutrition.  Patient pain has been relatively controlled with as needed use of Tylenol.  Bowel program has been adjusted to help manage constipation.  Flexeril was added to help with back pain/muscle spasms.  Follow-up x-rays of right ankle were ordered by Ortho remaining no change in normal alignment and splint was changed out to cast at discharge.  Sutures remain in place and needs to follow-up with Ortho in 2 weeks for suture removal.  Patient has made good gains during his rehab stay and is modified independent at discharge.  He has been educated on home exercise plan and outpatient rehab recommended once weightbearing has been advanced on RLE.     Rehab course: During patient's stay in rehab team conferences was held to monitor patient's progress, set goals and discuss barriers to discharge. At admission, patient required contact-guard assist with mobility and supervision with basic ADL tasks. He  has had improvement in activity tolerance, balance, postural control as well as ability to compensate for deficits. He has had improvement in functional use RLE and LLE as well as improvement in awareness.  He is able to complete ADL tasks and modified independent level. He is modified independent for transfers and is able to ambulate 75 feet with rolling walker.    Discharge disposition: 01-Home or Self Care  Diet: Regular  Special Instructions: 1.No weight on Right leg. Keep cast clean and dry 2. No Alcohol.   Allergies as of 01/12/2023       Reactions   Codeine Other (See Comments)   Jittery         Medication List     STOP taking these medications    Advil 200 MG tablet Generic drug: ibuprofen   buPROPion 150 MG 24 hr tablet Commonly known as: WELLBUTRIN XL   oxyCODONE 5 MG immediate release tablet Commonly known as: Roxicodone       TAKE these medications     acetaminophen 325 MG tablet Commonly known as: TYLENOL Take 1-2 tablets (325-650 mg total) by mouth every 4 (four) hours as needed for mild pain.   cyclobenzaprine 5 MG tablet Commonly known as: FLEXERIL Take 1 tablet (5 mg total) by mouth 3 (three) times daily as needed for muscle spasms.   diclofenac Sodium 1 % Gel Commonly known as: VOLTAREN Apply 2 g topically 4 (four) times daily.   ferrous sulfate 325 (65 FE) MG tablet Take 1 tablet (325 mg total) by mouth daily with breakfast. What changed: when to take this   furosemide 40 MG tablet Commonly known as: LASIX Take 1 tablet (40 mg total) by mouth daily. What changed: when to take this   pantoprazole 40 MG tablet Commonly known as: PROTONIX Take 1 tablet (40 mg total) by mouth 2 (two) times daily.   potassium chloride SA 20 MEQ tablet Commonly known as: KLOR-CON M Take 1 tablet (20 mEq total) by mouth daily.   propranolol 20 MG tablet Commonly known as:  INDERAL Take 1 tablet (20 mg total) by mouth 2 (two) times daily. What changed: See the new instructions.        Follow-up Information     [name], MD Follow up.   Specialty: Family Medicine Why: Call in 1-2 days for post hospital follow up Contact information: [address] [place] [address] [phone]         Napoleon Form, MD Follow up.   Specialty: Gastroenterology Why: Call in 1-2 days for post hospital follow up on GI bleed Contact information: [address] [place] [address] [phone]         Teryl Lucy, MD. Schedule an appointment as soon as possible for a visit in 2 week(s).   Specialty: Orthopedic Surgery Contact information: [address] [place] [address] [phone]                 Signed: Jacquelynn Cree 01/14/2023, 5:31 PM

## 2023-01-09 NOTE — Progress Notes (Signed)
Patient ID: Thomas [name], male   DOB: [birth year], 47 y.o.   MRN: [id number]   Diagnosis code:K74.60, S82.899A, G47.33 E66.01  Height: 5'5   Weight: 285 lbs   Patient has  which requires R-ankle fracture to be positioned in ways not feasible with a normal bed. His lower extremities requires frequent and immediate changes in body position which cannot be achieved with a normal bed. He is also recovering form a Upper GI bleed and needs to have his head of bed at 30 degrees. Length of need: 4-6 months

## 2023-01-09 NOTE — Plan of Care (Signed)
  Problem: RH SKIN INTEGRITY Goal: RH STG SKIN FREE OF INFECTION/BREAKDOWN Description: Incision to right foot will remain intact and free of infection/breakdown with min assist  Outcome: Progressing Goal: RH STG MAINTAIN SKIN INTEGRITY WITH ASSISTANCE Description: STG Maintain Skin Integrity With min Assistance. Outcome: Progressing   Problem: RH SAFETY Goal: RH STG ADHERE TO SAFETY PRECAUTIONS W/ASSISTANCE/DEVICE Description: STG Adhere to Safety Precautions With cueing Assistance/Device. Outcome: Progressing Goal: RH STG DECREASED RISK OF FALL WITH ASSISTANCE Description: STG Decreased Risk of Fall With min Assistance. Outcome: Progressing   Problem: RH PAIN MANAGEMENT Goal: RH STG PAIN MANAGED AT OR BELOW PT'S PAIN GOAL Description: Pain managed 4 out of 10 on pain scale with PRN medications min assist  Outcome: Progressing   Problem: RH KNOWLEDGE DEFICIT GENERAL Goal: RH STG INCREASE KNOWLEDGE OF SELF CARE AFTER HOSPITALIZATION Description: Patient/caregiver will be able to manage medications and self care from nursing education and nursing handouts independently  Outcome: Progressing

## 2023-01-09 NOTE — Group Note (Addendum)
Patient Details Name: Thomas [name] MRN: [id number] DOB: 1976/01/09 Today's Date: 01/09/2023  Time Calculation: OT Group Time Calculation OT Group Start Time: 1100 OT Group Stop Time: 1200 OT Group Time Calculation (min): 60 min      Group Description: BUE Therex Group: Pt participated in group session with a focus on BUE strength and cardio-endurance to facilitate improved activity tolerance and strength for higher level BADLs and functional mobility tasks with 4-5 lb weights. Also practiced w/c skills including: removing and replacing arm rests, donning and doffing LE rests, w/c mobility around obstacles, propelling w/c ~180 feet. Also introduced use of a reacher to use at home to pick up items from floor- practiced picking up bean bags with reacher. Also engaged in playing corn hole.       Individual level documentation: Pt first in engaged in seated warm up where pt completed shoulder and neck ROM.  Next, pt completed seated bicep curls, shoulder flexion/extension, and forward punches rowing elbow extension. Patient able to complete 20 reps reps. Using 5 lbs.  Ended session with guide deep breathing for 3 mins. Discussed benefits of deep breathing to manage stress and pain. Pt propelled back to room with mod I. Pt left sitting with all needs within reach and bed alarm/chair alarm activated.   Pain:  No c/o pain      Roney Mans [facility] 01/09/2023, 2:14 PM

## 2023-01-09 NOTE — Progress Notes (Signed)
Physical Therapy Session Note  Patient Details  Name: Thomas [name] MRN: [id number] Date of Birth: 07/26/1975  Today's Date: 01/09/2023 PT Individual Time: 0805-0920 PT Individual Time Calculation (min): 75 min   Short Term Goals: Week 1:  PT Short Term Goal 1 (Week 1): STG=LTG 2/2 ELOS  Skilled Therapeutic Interventions/Progress Updates:  Patient seated upright in w/c on entrance to room. Patient alert and agreeable to PT session. RN present and providing morning meds.   Patient with no pain complaint at start of session. Relates that he can control pain with no pressure to LE and managing medications.   Therapeutic Activity: Transfers: Pt performed sit<>stand and stand pivot transfers throughout session with supervision and RW. No vc required for technique and always producing good effort.   Gait Training:  Pt ambulated 30' x1/ 75' x1 using hop-to gait pattern with RW and close supervision. Demonstrated good balance and never steps LLE past hand position. Provided vc for energy conservation.   Discussed ramp required for home and educated at ramp that length is appropriate for proper gradient for one 6" step. Pt surprised at length required for one step.  When ordering removeable ramp, will need to be sure that length is appropriate for 3 steps to maintain ADA compliant grade for ease of mobility and increased safety. Ambulates up ramp using RW with supervision and then descends ramp with w/c. Recommended padded palm gloves for improved protection to hands.   Wheelchair Mobility:  Pt propelled wheelchair 100' x1/ 155' x1 with supervision/ Mod I. Guided in attaching/ removing leg rests and returns demos with supervision and min cues. Demos safe brake application throughout.   Guided pt through fwd/ bkwd slalom through 6 cones spaced 5 ft apart. Requires education re: pivot point of w/c and length of chair with LE elevated in order to understand personal spatial awareness. Provided vc/ tc  for improved technique and awareness of cones on either side during turns. Completes fwd with touch to 2 cones, bkwd requiring significant cues initially to complete first turn, then diminishing cues and completes with increased time and movement to 4 cones. Cones repositioned to 57ft apart and guided through fwd. Demos difficulty initially and quickly adjusts previous learning to new scenario.   Demos ability to position, turn, and back into space just bigger than w/c width x2. Minimal vc required.   Patient seated upright in w/c at end of session with brakes locked, belt alarm set, and all needs within reach.   Therapy Documentation Precautions:  Precautions Precautions: Fall Required Braces or Orthoses: Splint/Cast Splint/Cast: R foot/ankle Restrictions Weight Bearing Restrictions: Yes RLE Weight Bearing: Non weight bearing  Pain:  No pain indicated throughout session.   Therapy/Group: Individual Therapy  Loel Dubonnet PT, DPT, CSRS 01/09/2023, 1:00 PM

## 2023-01-10 ENCOUNTER — Inpatient Hospital Stay (HOSPITAL_COMMUNITY): Payer: BC Managed Care – PPO

## 2023-01-10 DIAGNOSIS — S82891D Other fracture of right lower leg, subsequent encounter for closed fracture with routine healing: Secondary | ICD-10-CM | POA: Diagnosis not present

## 2023-01-10 NOTE — IPOC Note (Signed)
Overall Plan of Care [facility]) Patient Details Name: Thomas [name] MRN: [id number] DOB: [birth year]  Admitting Diagnosis: Ankle fracture  Hospital Problems: Principal Problem:   Ankle fracture Active Problems:   Vitamin D deficiency   Acute blood loss anemia   Cirrhosis of liver (HCC)   Secondary esophageal varices with bleeding (HCC)   Thrombocytopenia (HCC)   Localized edema   History of ETOH abuse     Functional Problem List: Nursing Safety, Edema, Skin Integrity, Endurance, Medication Management, Pain  PT Balance, Edema, Endurance, Pain, Sensory  OT Balance, Pain, Edema, Endurance, Safety  SLP    TR         Basic ADL's: OT Bathing, Toileting, Dressing     Advanced  ADL's: OT       Transfers: PT Bed Mobility, Bed to Chair, Car, Occupational psychologist, Research scientist (life sciences): PT Ambulation, Psychologist, prison and probation services     Additional Impairments: OT None  SLP        TR      Anticipated Outcomes Item Anticipated Outcome  Self Feeding Independent  Swallowing      Basic self-care  Modified Independent  Toileting  Modified Independent   Bathroom Transfers Modified Independent  Bowel/Bladder  remain continent of B/B  Transfers  mod I  Locomotion  mod I  Communication     Cognition     Pain  less than 4  Safety/Judgment  no falls   Therapy Plan: PT Intensity: Minimum of 1-2 x/day ,45 to 90 minutes PT Frequency: 5 out of 7 days PT Duration Estimated Length of Stay: 5-7 days OT Intensity: Minimum of 1-2 x/day, 45 to 90 minutes OT Frequency: 5 out of 7 days OT Duration/Estimated Length of Stay: 3-5 days     Team Interventions: Nursing Interventions Patient/Family Education, Pain Management, Medication Management, Discharge Planning, Skin Care/Wound Management, Disease Management/Prevention  PT interventions Ambulation/gait training, DME/adaptive equipment instruction, UE/LE Strength taining/ROM, Balance/vestibular training, Skin care/wound management,  UE/LE Coordination activities, Cognitive remediation/compensation, Functional mobility training, Splinting/orthotics, Visual/perceptual remediation/compensation, Community reintegration, Neuromuscular re-education, Museum/gallery curator, Wheelchair propulsion/positioning, Discharge planning, Pain management, Therapeutic Activities, Disease management/prevention, Equities trader education, Therapeutic Exercise  OT Interventions Warden/ranger, Patient/family education, Self Care/advanced ADL retraining, Therapeutic Exercise, Wheelchair propulsion/positioning, Discharge planning, DME/adaptive equipment instruction, Functional mobility training, Pain management, Therapeutic Activities, UE/LE Strength taining/ROM  SLP Interventions    TR Interventions    SW/CM Interventions Discharge Planning, Psychosocial Support, Patient/Family Education   Barriers to Discharge MD  Medical stability  Nursing Decreased caregiver support, Home environment access/layout, Wound Care, Weight, Weight bearing restrictions multi level with B/B up 15-17 ste rail on left. Entry has 4 ste no rail  PT Inaccessible home environment, Weight, Home environment access/layout, Decreased caregiver support, Wound Care, Lack of/limited family support, Weight bearing restrictions    OT Inaccessible home environment, Home environment access/layout need to work out logistics of entry/exit house, access to bathrooms  SLP      SW Decreased caregiver support, Community education officer for SNF coverage     Team Discharge Planning: Destination: PT-Home ,OT- Home , SLP-  Projected Follow-up: PT-Outpatient PT, OT-  None, SLP-  Projected Equipment Needs: PT-To be determined, OT- To be determined, SLP-  Equipment Details: PT- , OT-  Patient/family involved in discharge planning: PT- Patient,  OT-Patient, SLP-   MD ELOS: 7-10 days Medical Rehab Prognosis:  Excellent Assessment: The patient has been admitted for CIR therapies with the diagnosis of  right ankle fracture s/p ORIF. The team will  be addressing functional mobility, strength, stamina, balance, safety, adaptive techniques and equipment, self-care, bowel and bladder mgt, patient and caregiver education. Goals have been set at supervision. Anticipated discharge destination is home.        See Team Conference Notes for weekly updates to the plan of care

## 2023-01-10 NOTE — Progress Notes (Signed)
PROGRESS NOTE   Subjective/Complaints: Asks that ace bandage be rewrapped since it is loosening No other complaints IPOC completed   ROS: Patient denies fever, chills, rash, sore throat, blurred vision, dizziness, nausea, vomiting, diarrhea, cough, shortness of breath or chest pain headache, or mood change.  + back pain- chronic  Objective:   DG Ankle Right Port  Result Date: 01/10/2023 CLINICAL DATA:  Closed right ankle fracture. EXAM: PORTABLE RIGHT ANKLE - 2 VIEW COMPARISON:  Right ankle radiographs 12/28/2022 FINDINGS: There is again overlying casting material that limits evaluation of fine bony detail. Redemonstration of ORIF of distal fibular and medial malleolar fractures including plate and screw fixation of the distal fibula and a screw traversing the medial malleolus. Additional distal tibiofibular syndesmosis tight rope fixation. The ankle mortise remains symmetric and intact. IMPRESSION: Redemonstration of ORIF of distal fibular and medial malleolar fractures with unchanged normal alignment. Electronically Signed   By: Neita Garnet M.D.   On: 01/10/2023 16:01   Recent Labs    01/08/23 0606 01/09/23 1239  WBC 2.6* 2.9*  HGB 7.3* 7.5*  HCT 24.8* 25.5*  PLT 125* 127*   Recent Labs    01/08/23 0606 01/09/23 1239  NA 138 139  K 3.6 3.7  CL 103 108  CO2 28 24  GLUCOSE 90 105*  BUN 9 11  CREATININE 0.83 0.90  CALCIUM 8.0* 8.1*    Intake/Output Summary (Last 24 hours) at 01/10/2023 1619 Last data filed at 01/10/2023 1415 Gross per 24 hour  Intake 900 ml  Output --  Net 900 ml        Physical Exam: Vital Signs Blood pressure (!) 114/55, pulse 60, temperature 97.9 F (36.6 C), temperature source Oral, resp. rate 18, height 5\' 5"  (1.651 m), weight 126.5 kg, SpO2 98%.   Gen: no distress, normal appearing HEENT: oral mucosa pink and moist, NCAT Cardio: Reg rate Chest: normal effort, normal rate of  breathing Abd: soft, non-distended Ext: no edema Psych: pleasant, normal affect Abdomen: Soft, non-tender, non-distended, bowel sounds positive. Extremities: Edema 1+ left lower extremity, splint in place right lower extremity Psych: Pt's affect is appropriate. Pt is cooperative Skin: Clean and intact without signs of breakdown Neuro: Alert and oriented x 4, follows commands, cranial nerves II through XII intact, normal speech and language Moving  bilateral upper extremities and left lower extremity to gravity and resistance Strength antigravity in resistance and right hip flexion and knee extension, able to wiggle toes in the right foot Sensation intact to light touch in all 4 extremities and toes on the right foot No ataxia or dysmetria noted Musculoskeletal:  Good sitting balance Mild tenderness along his hands    Assessment/Plan: 1. Functional deficits which require 3+ hours per day of interdisciplinary therapy in a comprehensive inpatient rehab setting. Physiatrist is providing close team supervision and 24 hour management of active medical problems listed below. Physiatrist and rehab team continue to assess barriers to discharge/monitor patient progress toward functional and medical goals  Care Tool:  Bathing    Body parts bathed by patient: Right arm, Left arm, Chest, Abdomen, Front perineal area, Right upper leg, Left upper leg, Right lower leg, Face, Buttocks  Body parts n/a: Left lower leg   Bathing assist Assist Level: Supervision/Verbal cueing     Upper Body Dressing/Undressing Upper body dressing   What is the patient wearing?: Pull over shirt    Upper body assist Assist Level: Independent    Lower Body Dressing/Undressing Lower body dressing      What is the patient wearing?: Pants     Lower body assist Assist for lower body dressing: Supervision/Verbal cueing     Toileting Toileting    Toileting assist Assist for toileting: Independent with  assistive device     Transfers Chair/bed transfer  Transfers assist     Chair/bed transfer assist level: Supervision/Verbal cueing     Locomotion Ambulation   Ambulation assist      Assist level: Contact Guard/Touching assist Assistive device: Walker-rolling Max distance: 77 feet   Walk 10 feet activity   Assist     Assist level: Supervision/Verbal cueing Assistive device: Walker-rolling   Walk 50 feet activity   Assist    Assist level: Supervision/Verbal cueing Assistive device: Walker-rolling    Walk 150 feet activity   Assist Walk 150 feet activity did not occur: Safety/medical concerns         Walk 10 feet on uneven surface  activity   Assist     Assist level: Contact Guard/Touching assist     Wheelchair     Assist Is the patient using a wheelchair?: Yes Type of Wheelchair: Manual    Wheelchair assist level: Supervision/Verbal cueing      Wheelchair 50 feet with 2 turns activity    Assist        Assist Level: Supervision/Verbal cueing   Wheelchair 150 feet activity     Assist      Assist Level: Supervision/Verbal cueing   Blood pressure (!) 114/55, pulse 60, temperature 97.9 F (36.6 C), temperature source Oral, resp. rate 18, height 5\' 5"  (1.651 m), weight 126.5 kg, SpO2 98%.  Medical Problem List and Plan: 1. Functional deficits secondary to right ankle fracture s/p ORIF bimalleolar ankle fracture with fixation, right syndesmosis by Dr. Dion Saucier on 12/28/2022         and upper GI bleed patient -patient may  shower if right lower extremity is Covered             -ELOS/Goals: 7 to 10 days, supervision with PT, supervision with OT             -Continue CIR/evaluations today             -Follow-up with orthopedics around 01/11/2023             -Nonweightbearing right lower extremity, elevate right lower extremity when in bed 2.  Antithrombotics: -DVT/anticoagulation:  Mechanical: Sequential compression devices,  below knee Bilateral lower extremities -Avoid pharmacological DVT prophylaxis due to variceal bleeding             -antiplatelet therapy: N/A  3. Pain Management:  Tylenol or oxycodone prn. Ultram added for moderate pain prn.   -8/1 heating pad for lower back pain, discussed trying his muscle  relaxer Flexeril 8/2 Rare use of oxycodone, continue flexeril as needed 4. Mood/Behavior/Sleep: LCSW to follow for evaluation and support.             --trazodone prn for insomnia.  Has been sleeping from 9p- 5a (getting used to hospital)             -antipsychotic agents:  N/A 5. Neuropsych/cognition: This patient is capable of  making decisions on his own behalf. 6. Skin/Wound Care: Routine pressure relief  7. Fluids/Electrolytes/Nutrition: Monitor I/O. Check CMET in am 8. Displaced Right bimalleolar ankle Fx s/p ORIF 12/28/22: NWB with splint.  9. UGIB s/p esophageal varices banding: Continue to monitor H/H as trending 7.2- 7.7 range.  Continue propranolol. 10. Cirrhosis of the Liver: Continue inderal and PPI.  Continue Lasix.  Monitor for encephalopathy/hematemesis/melena.              --father had hepatitis w/cirrhosis of liver/mother had liver disease.              --continue to educate on importance of ETOH abstinence (ready to quit) 11. Acute on chronic anemia: Started on iron supplement.    -8/1 Hgb stable at 7.3  Recheck today 12. Thrombocytopenia: Monitor for signs of recurrent bleeding.    -8/1 stable at 125 13. BLE edema: Continue Lasix bid. Elevate BLE when seated. TED on RLE             --low salt diet. Monitor for need of K supplement.   -Recheck BMP today  -8/2 lasix decreased to daily, K+ 20eq daily  8/3: will continue daily lasix  14. Morbid obesity: dietary counseling  Body mass index is 47.43 kg/m.  8/3: BMI reviewed and improved to 46.41  15.  Psoriasis  -Patient reporting some discomfort in his hands, continue Voltaren gel.  Consider additional medication if does not  improve  16. Hypotension: continue to monitor HR TID  LOS: 3 days A FACE TO FACE EVALUATION WAS PERFORMED  Drema Pry  01/10/2023, 4:19 PM

## 2023-01-11 DIAGNOSIS — S82891D Other fracture of right lower leg, subsequent encounter for closed fracture with routine healing: Secondary | ICD-10-CM | POA: Diagnosis not present

## 2023-01-11 MED ORDER — PROPRANOLOL HCL 20 MG PO TABS
10.0000 mg | ORAL_TABLET | Freq: Two times a day (BID) | ORAL | Status: DC
  Administered 2023-01-11 – 2023-01-12 (×4): 10 mg via ORAL
  Filled 2023-01-11 (×4): qty 1

## 2023-01-11 NOTE — Plan of Care (Signed)
  Problem: Consults Goal: RH GENERAL PATIENT EDUCATION Description: See Patient Education module for education specifics. Outcome: Progressing   Problem: RH SKIN INTEGRITY Goal: RH STG SKIN FREE OF INFECTION/BREAKDOWN Description: Incision to right foot will remain intact and free of infection/breakdown with min assist  Outcome: Progressing Goal: RH STG MAINTAIN SKIN INTEGRITY WITH ASSISTANCE Description: STG Maintain Skin Integrity With min Assistance. Outcome: Progressing   Problem: RH SAFETY Goal: RH STG ADHERE TO SAFETY PRECAUTIONS W/ASSISTANCE/DEVICE Description: STG Adhere to Safety Precautions With cueing Assistance/Device. Outcome: Progressing Goal: RH STG DECREASED RISK OF FALL WITH ASSISTANCE Description: STG Decreased Risk of Fall With min Assistance. Outcome: Progressing   Problem: RH PAIN MANAGEMENT Goal: RH STG PAIN MANAGED AT OR BELOW PT'S PAIN GOAL Description: Pain managed 4 out of 10 on pain scale with PRN medications min assist  Outcome: Progressing   Problem: RH KNOWLEDGE DEFICIT GENERAL Goal: RH STG INCREASE KNOWLEDGE OF SELF CARE AFTER HOSPITALIZATION Description: Patient/caregiver will be able to manage medications and self care from nursing education and nursing handouts independently  Outcome: Progressing

## 2023-01-11 NOTE — Progress Notes (Signed)
PROGRESS NOTE   Subjective/Complaints: Sleepy this morning No complaints Tolerated therapy well today No issues overnight   ROS: Patient denies fever, chills, rash, sore throat, blurred vision, dizziness, nausea, vomiting, diarrhea, cough, shortness of breath or chest pain headache, or mood change.  + back pain- chronic  Objective:   DG Ankle Right Port  Result Date: 01/10/2023 CLINICAL DATA:  Closed right ankle fracture. EXAM: PORTABLE RIGHT ANKLE - 2 VIEW COMPARISON:  Right ankle radiographs 12/28/2022 FINDINGS: There is again overlying casting material that limits evaluation of fine bony detail. Redemonstration of ORIF of distal fibular and medial malleolar fractures including plate and screw fixation of the distal fibula and a screw traversing the medial malleolus. Additional distal tibiofibular syndesmosis tight rope fixation. The ankle mortise remains symmetric and intact. IMPRESSION: Redemonstration of ORIF of distal fibular and medial malleolar fractures with unchanged normal alignment. Electronically Signed   By: Neita Garnet M.D.   On: 01/10/2023 16:01   Recent Labs    01/09/23 1239  WBC 2.9*  HGB 7.5*  HCT 25.5*  PLT 127*   Recent Labs    01/09/23 1239  NA 139  K 3.7  CL 108  CO2 24  GLUCOSE 105*  BUN 11  CREATININE 0.90  CALCIUM 8.1*    Intake/Output Summary (Last 24 hours) at 01/11/2023 1608 Last data filed at 01/11/2023 1400 Gross per 24 hour  Intake 480 ml  Output --  Net 480 ml        Physical Exam: Vital Signs Blood pressure 120/75, pulse 65, temperature 98.1 F (36.7 C), temperature source Oral, resp. rate 17, height 5\' 5"  (1.651 m), weight 128.7 kg, SpO2 100%.  Gen: no distress, normal appearing HEENT: oral mucosa pink and moist, NCAT Cardio: Reg rate Chest: normal effort, normal rate of breathing Abdomen: Soft, non-tender, non-distended, bowel sounds positive. Extremities: Edema 1+  left lower extremity, splint in place right lower extremity Psych: Pt's affect is appropriate. Pt is cooperative Skin: Clean and intact without signs of breakdown Neuro: Alert and oriented x 4, follows commands, cranial nerves II through XII intact, normal speech and language Moving  bilateral upper extremities and left lower extremity to gravity and resistance Strength antigravity in resistance and right hip flexion and knee extension, able to wiggle toes in the right foot Sensation intact to light touch in all 4 extremities and toes on the right foot No ataxia or dysmetria noted Musculoskeletal:  Good sitting balance Mild tenderness along his hands    Assessment/Plan: 1. Functional deficits which require 3+ hours per day of interdisciplinary therapy in a comprehensive inpatient rehab setting. Physiatrist is providing close team supervision and 24 hour management of active medical problems listed below. Physiatrist and rehab team continue to assess barriers to discharge/monitor patient progress toward functional and medical goals  Care Tool:  Bathing    Body parts bathed by patient: Right arm, Left arm, Chest, Abdomen, Front perineal area, Right upper leg, Left upper leg, Face, Buttocks, Left lower leg     Body parts n/a: Right lower leg   Bathing assist Assist Level: Independent with assistive device     Upper Body Dressing/Undressing Upper body dressing  What is the patient wearing?: Pull over shirt    Upper body assist Assist Level: Independent    Lower Body Dressing/Undressing Lower body dressing      What is the patient wearing?: Pants     Lower body assist Assist for lower body dressing: Independent with assitive device     Toileting Toileting    Toileting assist Assist for toileting: Independent with assistive device     Transfers Chair/bed transfer  Transfers assist     Chair/bed transfer assist level: Independent with assistive device      Locomotion Ambulation   Ambulation assist      Assist level: Independent with assistive device Assistive device: Walker-rolling Max distance: 75 feet   Walk 10 feet activity   Assist     Assist level: Independent with assistive device Assistive device: Walker-rolling   Walk 50 feet activity   Assist    Assist level: Independent with assistive device Assistive device: Walker-rolling    Walk 150 feet activity   Assist Walk 150 feet activity did not occur: Safety/medical concerns         Walk 10 feet on uneven surface  activity   Assist     Assist level: Independent with assistive device Assistive device: Walker-rolling, Walker-platform   Wheelchair     Assist Is the patient using a wheelchair?: Yes Type of Wheelchair: Manual    Wheelchair assist level: Independent Max wheelchair distance: 150+    Wheelchair 50 feet with 2 turns activity    Assist        Assist Level: Independent   Wheelchair 150 feet activity     Assist      Assist Level: Independent   Blood pressure 120/75, pulse 65, temperature 98.1 F (36.7 C), temperature source Oral, resp. rate 17, height 5\' 5"  (1.651 m), weight 128.7 kg, SpO2 100%.  Medical Problem List and Plan: 1. Functional deficits secondary to right ankle fracture s/p ORIF bimalleolar ankle fracture with fixation, right syndesmosis by Dr. Dion Saucier on 12/28/2022         and upper GI bleed patient -patient may  shower if right lower extremity is Covered             -ELOS/Goals: 7 to 10 days, supervision with PT, supervision with OT             -Continue CIR/evaluations today             -Follow-up with orthopedics around 01/11/2023             -Nonweightbearing right lower extremity, elevate right lower extremity when in bed 2.  Antithrombotics: -DVT/anticoagulation:  Mechanical: Sequential compression devices, below knee Bilateral lower extremities -Avoid pharmacological DVT prophylaxis due to  variceal bleeding             -antiplatelet therapy: N/A  3. Pain Management:  Tylenol or oxycodone prn. Ultram added for moderate pain prn.   -8/1 heating pad for lower back pain, discussed trying his muscle  relaxer Flexeril 8/2 Rare use of oxycodone, continue flexeril as needed 4. Mood/Behavior/Sleep: LCSW to follow for evaluation and support.             --trazodone prn for insomnia.  Has been sleeping from 9p- 5a (getting used to hospital)             -antipsychotic agents:  N/A 5. Neuropsych/cognition: This patient is capable of making decisions on his own behalf. 6. Skin/Wound Care: Routine pressure relief  7. Fluids/Electrolytes/Nutrition: Monitor  I/O. Check CMET in am 8. Displaced Right bimalleolar ankle Fx s/p ORIF 12/28/22: NWB with splint.  9. UGIB s/p esophageal varices banding: Continue to monitor H/H as trending 7.2- 7.7 range.  Continue propranolol. 10. Cirrhosis of the Liver: Continue inderal and PPI.  Contiue Lasix.  Monitor for encephalopathy/hematemesis/melena.              --father had hepatitis w/cirrhosis of liver/mother had liver disease.              --continue to educate on importance of ETOH abstinence (ready to quit)  11. Acute on chronic anemia: Continue iron supplement.    -8/1 Hgb stable at 7.3 12. Thrombocytopenia: Monitor for signs of recurrent bleeding.    -8/1 stable at 125 13. BLE edema: Continue Lasix bid. Elevate BLE when seated. TED on RLE             --low salt diet. Monitor for need of K supplement.   -Recheck BMP today  -8/2 lasix decreased to daily, K+ 20eq daily  8/3: will continue daily lasix  14. Morbid obesity: dietary counseling  Body mass index is 47.43 kg/m.  8/3: BMI reviewed and improved to 46.41  8/4: BMI reviewed and increased to 47.22  15.  Psoriasis  -Patient reporting some discomfort in his hands, continue Voltaren gel.  Consider additional medication if does not improve  16. Hypotension: continue to monitor HR TID,  decreased inderal to 10mg  BID, resolved.   LOS: 4 days A FACE TO FACE EVALUATION WAS PERFORMED  Drema Pry  01/11/2023, 4:08 PM

## 2023-01-11 NOTE — Progress Notes (Signed)
Physical Therapy Discharge Summary  Patient Details  Name: Thomas [name] MRN: [id number] Date of Birth: 27-Mar-1976  Date of Discharge from PT service:January 11, 2023  Today's Date: 01/11/2023 PT Individual Time: 1000-1102, [id number] PT Individual Time Calculation (min): 62 min, 72 min    Patient has met 10 of 10 long term goals due to improved activity tolerance, improved balance, increased strength, decreased pain, ability to compensate for deficits, improved attention, improved awareness, and improved coordination.  Patient to discharge at a wheelchair level with short distance ambulation Modified Independent.   Patient's care partner is independent to provide the necessary physical assistance at discharge.  Recommendation:  Patient will benefit from ongoing skilled PT services in outpatient setting to continue to advance safe functional mobility, address ongoing impairments in strength, balance, gait, and minimize fall risk.  Equipment: RW, hospital bed, reacher, BSC, ramp   Reasons for discharge: treatment goals met and discharge from hospital  Patient/family agrees with progress made and goals achieved: Yes  PT Discharge Precautions/Restrictions Precautions Precautions: Fall Required Braces or Orthoses: Splint/Cast Splint/Cast: R foot/ankle Restrictions Weight Bearing Restrictions: Yes RLE Weight Bearing: Non weight bearing Pain Interference Pain Interference Pain Effect on Sleep: 2. Occasionally Pain Interference with Therapy Activities: 1. Rarely or not at all Pain Interference with Day-to-Day Activities: 2. Occasionally Vision/Perception  Vision - History Ability to See in Adequate Light: 0 Adequate Perception Perception: Within Functional Limits Praxis Praxis: Intact  Cognition Overall Cognitive Status: Within Functional Limits for tasks assessed Arousal/Alertness: Awake/alert Orientation Level: Oriented X4 Divided Attention: Appears intact Memory: Appears  intact Awareness: Appears intact Problem Solving: Appears intact Safety/Judgment: Appears intact Sensation Sensation Light Touch: Appears Intact Proprioception: Appears Intact Additional Comments: L LE light touch sensation intact in all dermatome, R LE impaired, unable to determine if sensation impaired or unable to detect 2/2 cast Coordination Gross Motor Movements are Fluid and Coordinated: No Fine Motor Movements are Fluid and Coordinated: Yes Coordination and Movement Description: Limited by R LE NWB Motor  Motor Motor: Within Functional Limits  Mobility Bed Mobility Bed Mobility: Rolling Right;Rolling Left;Supine to Sit;Sit to Supine Rolling Right: Independent with assistive device Rolling Left: Independent with assistive device Supine to Sit: Independent with assistive device Sit to Supine: Independent with assistive device Transfers Transfers: Sit to Stand;Stand to Sit;Stand Pivot Transfers Sit to Stand: Independent with assistive device Stand to Sit: Independent with assistive device Stand Pivot Transfers: Independent with assistive device Transfer (Assistive device): Rolling walker Locomotion  Gait Ambulation: Yes Gait Assistance: Independent with assistive device Gait Distance (Feet): 75 Feet Assistive device: Rolling walker Gait Gait: Yes Gait Pattern: Impaired Gait Pattern:  (hop to gait 2/2 precautions) Gait velocity: Decreased velocity Stairs / Additional Locomotion Stairs: No Wheelchair Mobility Wheelchair Mobility: Yes Wheelchair Assistance: Independent with Scientist, research (life sciences): Both upper extremities Wheelchair Parts Management: Independent  Trunk/Postural Assessment  Cervical Assessment Cervical Assessment: Within Functional Limits Thoracic Assessment Thoracic Assessment: Within Functional Limits Lumbar Assessment Lumbar Assessment: Within Functional Limits Postural Control Postural Control: Within Functional Limits   Balance Balance Balance Assessed: Yes Static Sitting Balance Static Sitting - Level of Assistance: 7: Independent Dynamic Sitting Balance Dynamic Sitting - Balance Support: During functional activity Dynamic Sitting - Level of Assistance: 7: Independent;6: Modified independent (Device/Increase time) Static Standing Balance Static Standing - Level of Assistance: 6: Modified independent (Device/Increase time) Dynamic Standing Balance Dynamic Standing - Balance Support: Bilateral upper extremity supported Dynamic Standing - Level of Assistance: 6: Modified independent (Device/Increase time) Extremity Assessment  RLE Assessment RLE Assessment: Exceptions to [facility] General Strength Comments: grossly at least 3/5, did not apply resistance 2/2 NWB precautions LLE Assessment LLE Assessment: Within Functional Limits    Today's Interventions   Treatment Session 1  Pt seated in WC upon arrival. Therapist followed up regarding equipment for home including hospital bed and ramp installation. Pt reports he has to call them when they open tomorrow, as pt attempted to call previous days and they did not answer. Pt reports plans to do ambulance transfer to house, and sleep in recliner in meantime.   Pt performed components of discharge with mod I including furniture transfer to recliner since pt plans to sleep in recliner until hospital bed arrives.   Pt seated in WC at end of session with all needs within reach. Pt made mod I in room.   Treatment Session 2   Pt seated in WC upon arrival. Pt reviewed and performed the following HEP bilaterally. Handout provided   2x15 LAQ   2x15 Seated marching  2x15 Wheelchair pushups  2x15 Single leg bridges   2x15 Prone hip extension  2x15 Sidelying hip abduction  2x15 SLR  Pt performed the following therex for UE and core strengthening with 4# medicine ball while seated EOM.   2x15 sit ups  2x15 chest press   2x15 shoulder press   2x15  clockwise/counter clockwise circles while holding medicine ball overhead   2x15 clockwise/counter clockwise circles while holding medicine ball in front of chest  2x15 russian twists  Pt seated in WC at end of session with all needs within reach.    [facility], [place], DPT  01/11/2023, 3:22 PM

## 2023-01-11 NOTE — Plan of Care (Signed)
  Problem: Consults Goal: RH GENERAL PATIENT EDUCATION Description: See Patient Education module for education specifics. Outcome: Completed/Met   Problem: RH SKIN INTEGRITY Goal: RH STG SKIN FREE OF INFECTION/BREAKDOWN Description: Incision to right foot will remain intact and free of infection/breakdown with min assist  Outcome: Completed/Met Goal: RH STG MAINTAIN SKIN INTEGRITY WITH ASSISTANCE Description: STG Maintain Skin Integrity With min Assistance. Outcome: Completed/Met   Problem: RH SAFETY Goal: RH STG ADHERE TO SAFETY PRECAUTIONS W/ASSISTANCE/DEVICE Description: STG Adhere to Safety Precautions With cueing Assistance/Device. Outcome: Completed/Met Goal: RH STG DECREASED RISK OF FALL WITH ASSISTANCE Description: STG Decreased Risk of Fall With min Assistance. Outcome: Completed/Met   Problem: RH PAIN MANAGEMENT Goal: RH STG PAIN MANAGED AT OR BELOW PT'S PAIN GOAL Description: Pain managed 4 out of 10 on pain scale with PRN medications min assist  Outcome: Completed/Met   Problem: RH KNOWLEDGE DEFICIT GENERAL Goal: RH STG INCREASE KNOWLEDGE OF SELF CARE AFTER HOSPITALIZATION Description: Patient/caregiver will be able to manage medications and self care from nursing education and nursing handouts independently  Outcome: Completed/Met   Problem: RH Balance Goal: LTG Patient will maintain dynamic sitting balance (PT) Description: LTG:  Patient will maintain dynamic sitting balance with assistance during mobility activities (PT) Outcome: Completed/Met Goal: LTG Patient will maintain dynamic standing balance (PT) Description: LTG:  Patient will maintain dynamic standing balance with assistance during mobility activities (PT) Outcome: Completed/Met   Problem: Sit to Stand Goal: LTG:  Patient will perform sit to stand with assistance level (PT) Description: LTG:  Patient will perform sit to stand with assistance level (PT) Outcome: Completed/Met   Problem: RH Bed  Mobility Goal: LTG Patient will perform bed mobility with assist (PT) Description: LTG: Patient will perform bed mobility with assistance, with/without cues (PT). Outcome: Completed/Met   Problem: RH Bed to Chair Transfers Goal: LTG Patient will perform bed/chair transfers w/assist (PT) Description: LTG: Patient will perform bed to chair transfers with assistance (PT). Outcome: Completed/Met   Problem: RH Car Transfers Goal: LTG Patient will perform car transfers with assist (PT) Description: LTG: Patient will perform car transfers with assistance (PT). Outcome: Completed/Met   Problem: RH Ambulation Goal: LTG Patient will ambulate in controlled environment (PT) Description: LTG: Patient will ambulate in a controlled environment, # of feet with assistance (PT). Outcome: Completed/Met Goal: LTG Patient will ambulate in home environment (PT) Description: LTG: Patient will ambulate in home environment, # of feet with assistance (PT). Outcome: Completed/Met   Problem: RH Wheelchair Mobility Goal: LTG Patient will propel w/c in controlled environment (PT) Description: LTG: Patient will propel wheelchair in controlled environment, # of feet with assist (PT) Outcome: Completed/Met Goal: LTG Patient will propel w/c in home environment (PT) Description: LTG: Patient will propel wheelchair in home environment, # of feet with assistance (PT). Outcome: Completed/Met

## 2023-01-11 NOTE — Progress Notes (Signed)
Occupational Therapy Discharge Summary  Patient Details  Name: Thomas [name] MRN: [id number] Date of Birth: 1975-07-01  Date of Discharge from OT service:January 11, 2023  Today's Date: 01/11/2023 OT Individual Time: 0820-0905 OT Individual Time Calculation (min): 45 min  and Today's Date: 01/11/2023 OT Missed Time: 10 Minutes Missed Time Reason: Other (comment) (Eating)   Patient has met 7 of 7 long term goals due to improved activity tolerance, improved balance, postural control, and ability to compensate for deficits.  Patient to discharge at overall Modified Independent level.  Patient's care partner is independent to provide the necessary  intermediate physical assistance  for IADL at discharge.    Reasons goals not met: NA  Recommendation:  No OT follow-up recommended at this time.   Equipment: BSC  Reasons for discharge: treatment goals met and discharge from hospital  Patient/family agrees with progress made and goals achieved: Yes  OT Discharge Precautions/Restrictions  Precautions Precautions: Fall Required Braces or Orthoses: Splint/Cast Splint/Cast: R foot/ankle Restrictions RLE Weight Bearing: Non weight bearing Pain Pain Assessment Pain Scale: 0-10 Pain Score: 5  Pain Type: Acute pain Pain Location: Ankle Pain Orientation: Right Pain Descriptors / Indicators: Throbbing Pain Intervention(s): Rest;Shower;Medication (See eMAR) ADL ADL Equipment Provided: Long-handled sponge Eating: Independent Where Assessed-Eating: Bed level Grooming: Independent Where Assessed-Grooming: Sitting at sink Upper Body Bathing: Independent Where Assessed-Upper Body Bathing: Shower Lower Body Bathing: Modified independent Where Assessed-Lower Body Bathing: Shower Upper Body Dressing: Independent Where Assessed-Upper Body Dressing: Chair Lower Body Dressing: Modified independent Where Assessed-Lower Body Dressing: Chair Toileting: Modified independent Where  Assessed-Toileting: Neurosurgeon Method: Stand pivot, Proofreader: Other (comment) (RW) Tub/Shower Transfer: Unable to assess Tub/Shower Transfer Method: Unable to assess Film/video editor: Modified independent Film/video editor Method: Designer, industrial/product: Sales promotion account executive Baseline Vision/History: 1 Wears glasses (Blue filter for computer use.) Patient Visual Report: No change from baseline Vision Assessment?: No apparent visual deficits Perception  Perception: Within Functional Limits Praxis Praxis: Intact Cognition Cognition Overall Cognitive Status: Within Functional Limits for tasks assessed Arousal/Alertness: Awake/alert Orientation Level: Person;Place;Situation Memory: Appears intact Awareness: Appears intact Problem Solving: Appears intact Safety/Judgment: Appears intact Brief Interview for Mental Status (BIMS) Repetition of Three Words (First Attempt): 3 Temporal Orientation: Year: Correct Temporal Orientation: Month: Accurate within 5 days Temporal Orientation: Day: Correct Recall: "Sock": Yes, no cue required Recall: "Blue": Yes, no cue required Recall: "Bed": Yes, after cueing ("a piece of furniture") BIMS Summary Score: 14 Sensation Sensation Light Touch: Appears Intact Hot/Cold: Appears Intact Proprioception: Appears Intact Stereognosis: Appears Intact Additional Comments: L LE light touch sensation intact in all dermatome, R LE impaired, unable to determine if sensation impaired or unable to detect 2/2 cast Coordination Gross Motor Movements are Fluid and Coordinated: No Fine Motor Movements are Fluid and Coordinated: Yes Coordination and Movement Description: Limited by R LE NWB Motor  Motor Motor: Within Functional Limits Mobility  Transfers Sit to Stand: Independent with assistive device Stand to Sit: Independent with assistive device   Trunk/Postural Assessment  Cervical Assessment Cervical Assessment: Within Functional Limits Thoracic Assessment Thoracic Assessment: Within Functional Limits Lumbar Assessment Lumbar Assessment: Within Functional Limits Postural Control Postural Control: Within Functional Limits  Balance Balance Balance Assessed: Yes Static Sitting Balance Static Sitting - Level of Assistance: 7: Independent Dynamic Sitting Balance Dynamic Sitting - Balance Support: During functional activity Dynamic Sitting - Level of Assistance: 6: Modified independent (Device/Increase time) Static Standing Balance Static Standing - Balance  Support: Bilateral upper extremity supported;During functional activity Static Standing - Level of Assistance: 6: Modified independent (Device/Increase time) Dynamic Standing Balance Dynamic Standing - Balance Support: Bilateral upper extremity supported Dynamic Standing - Level of Assistance: 6: Modified independent (Device/Increase time) Dynamic Standing - Balance Activities: Lateral lean/weight shifting Extremity/Trunk Assessment RUE Assessment RUE Assessment: Within Functional Limits LUE Assessment LUE Assessment: Within Functional Limits   Pt received eating breakfast for skilled OT session with focus on BADL participation and discharge planning. Pt agreeable to interventions, demonstrating overall pleasant mood. Pt reported 5/10 acute/surgical pain in R-ankle, RN made aware. OT offering intermediate rest breaks and positioning suggestions throughout session to address pain/fatigue and maximize participation/safety in session.   Pt performs all functional transfers with supervision-Mod I with light cuing for safe use of RW. Pt completes 3/3 toileting activities with distant supervision + RW/grab bar, pt's BSC already delivered to room. Pt bathes/dresses full-body at shower with same level of assistance, planning to sponge-bathe at home due to inaccessibility of upstairs  shower/tub. Education and handout provided for future use of TTB once able to access second floor of home.  Pt remained resting in bed with all immediate needs met at end of session.  Lou Cal, OTR/L, MSOT  01/11/2023, 8:31 AM

## 2023-01-11 NOTE — Plan of Care (Signed)
  Problem: RH Balance Goal: LTG Patient will maintain dynamic standing with ADLs (OT) Description: LTG:  Patient will maintain dynamic standing balance with assist during activities of daily living (OT)  Outcome: Completed/Met   Problem: Sit to Stand Goal: LTG:  Patient will perform sit to stand in prep for activites of daily living with assistance level (OT) Description: LTG:  Patient will perform sit to stand in prep for activites of daily living with assistance level (OT) Outcome: Completed/Met   Problem: RH Bathing Goal: LTG Patient will bathe all body parts with assist levels (OT) Description: LTG: Patient will bathe all body parts with assist levels (OT) Outcome: Completed/Met   Problem: RH Dressing Goal: LTG Patient will perform lower body dressing w/assist (OT) Description: LTG: Patient will perform lower body dressing with assist, with/without cues in positioning using equipment (OT) Outcome: Completed/Met   Problem: RH Toileting Goal: LTG Patient will perform toileting task (3/3 steps) with assistance level (OT) Description: LTG: Patient will perform toileting task (3/3 steps) with assistance level (OT)  Outcome: Completed/Met   Problem: RH Toilet Transfers Goal: LTG Patient will perform toilet transfers w/assist (OT) Description: LTG: Patient will perform toilet transfers with assist, with/without cues using equipment (OT) Outcome: Completed/Met   Problem: RH Tub/Shower Transfers Goal: LTG Patient will perform tub/shower transfers w/assist (OT) Description: LTG: Patient will perform tub/shower transfers with assist, with/without cues using equipment (OT) Outcome: Completed/Met   

## 2023-01-12 DIAGNOSIS — I959 Hypotension, unspecified: Secondary | ICD-10-CM

## 2023-01-12 MED ORDER — PANTOPRAZOLE SODIUM 40 MG PO TBEC: 40.0000 mg | DELAYED_RELEASE_TABLET | Freq: Two times a day (BID) | ORAL | 0 refills | Status: DC

## 2023-01-12 MED ORDER — PROPRANOLOL HCL 20 MG PO TABS
20.0000 mg | ORAL_TABLET | Freq: Two times a day (BID) | ORAL | 0 refills | Status: DC
Start: 2023-01-12 — End: 2023-02-12

## 2023-01-12 MED ORDER — DICLOFENAC SODIUM 1 % EX GEL: 2.0000 g | Freq: Four times a day (QID) | CUTANEOUS | 0 refills | Status: DC

## 2023-01-12 MED ORDER — POTASSIUM CHLORIDE CRYS ER 20 MEQ PO TBCR: 20.0000 meq | EXTENDED_RELEASE_TABLET | Freq: Every day | ORAL | 0 refills | Status: DC

## 2023-01-12 MED ORDER — CYCLOBENZAPRINE HCL 5 MG PO TABS: 5.0000 mg | ORAL_TABLET | Freq: Three times a day (TID) | ORAL | 0 refills | Status: DC | PRN

## 2023-01-12 MED ORDER — FERROUS SULFATE 325 (65 FE) MG PO TABS: 325.0000 mg | ORAL_TABLET | Freq: Every day | ORAL | 3 refills | Status: DC

## 2023-01-12 MED ORDER — FUROSEMIDE 40 MG PO TABS: 40.0000 mg | ORAL_TABLET | Freq: Every day | ORAL | 0 refills | Status: DC

## 2023-01-12 MED ORDER — DIPHENOXYLATE-ATROPINE 2.5-0.025 MG PO TABS
2.0000 | ORAL_TABLET | Freq: Once | ORAL | Status: AC
  Administered 2023-01-12: 2 via ORAL
  Filled 2023-01-12: qty 2

## 2023-01-12 NOTE — Progress Notes (Signed)
PROGRESS NOTE   Subjective/Complaints: No new concerns this AM. Looking forward to going home.    ROS: Patient denies fever, chills, rash, sore throat, blurred vision, dizziness, nausea, vomiting, diarrhea, cough, shortness of breath or chest pain headache, or mood change. No new motor or sensory changes.   + back pain- chronic  Objective:   DG Ankle Right Port  Result Date: 01/10/2023 CLINICAL DATA:  Closed right ankle fracture. EXAM: PORTABLE RIGHT ANKLE - 2 VIEW COMPARISON:  Right ankle radiographs 12/28/2022 FINDINGS: There is again overlying casting material that limits evaluation of fine bony detail. Redemonstration of ORIF of distal fibular and medial malleolar fractures including plate and screw fixation of the distal fibula and a screw traversing the medial malleolus. Additional distal tibiofibular syndesmosis tight rope fixation. The ankle mortise remains symmetric and intact. IMPRESSION: Redemonstration of ORIF of distal fibular and medial malleolar fractures with unchanged normal alignment. Electronically Signed   By: Neita Garnet M.D.   On: 01/10/2023 16:01   Recent Labs    01/09/23 1239 01/12/23 0613  WBC 2.9* 2.8*  HGB 7.5* 7.5*  HCT 25.5* 25.8*  PLT 127* 123*   Recent Labs    01/09/23 1239 01/12/23 0613  NA 139 142  K 3.7 3.7  CL 108 107  CO2 24 27  GLUCOSE 105* 127*  BUN 11 12  CREATININE 0.90 0.81  CALCIUM 8.1* 8.3*    Intake/Output Summary (Last 24 hours) at 01/12/2023 1229 Last data filed at 01/11/2023 1400 Gross per 24 hour  Intake 480 ml  Output --  Net 480 ml        Physical Exam: Vital Signs Blood pressure 124/65, pulse (!) 59, temperature 98.6 F (37 C), resp. rate 16, height 5\' 5"  (1.651 m), weight 125.7 kg, SpO2 100%.  Gen: no distress, normal appearing, sitting in chair HEENT: oral mucosa pink and moist, NCAT Cardio: RRR Chest: CTAB, normal effort, normal rate of  breathing Abdomen: Soft, non-tender, non-distended, bowel sounds positive. Extremities: Edema 1+ left lower extremity, splint in place right lower extremity Psych: Pt's affect is appropriate. Pt is cooperative Skin: Clean and intact without signs of breakdown Neuro: Alert and oriented x 4, follows commands, cranial nerves II through XII intact, normal speech and language Moving  bilateral upper extremities and left lower extremity to gravity and resistance Strength antigravity in resistance and right hip flexion and knee extension, able to wiggle toes in the right foot Sensation intact to light touch in all 4 extremities and toes on the right foot No ataxia or dysmetria noted Musculoskeletal:  Good sitting balance Mild tenderness along his hands    Assessment/Plan: 1. Functional deficits which require 3+ hours per day of interdisciplinary therapy in a comprehensive inpatient rehab setting. Physiatrist is providing close team supervision and 24 hour management of active medical problems listed below. Physiatrist and rehab team continue to assess barriers to discharge/monitor patient progress toward functional and medical goals  Care Tool:  Bathing    Body parts bathed by patient: Right arm, Left arm, Chest, Abdomen, Front perineal area, Right upper leg, Left upper leg, Face, Buttocks, Left lower leg     Body parts n/a: Right  lower leg   Bathing assist Assist Level: Independent with assistive device     Upper Body Dressing/Undressing Upper body dressing   What is the patient wearing?: Pull over shirt    Upper body assist Assist Level: Independent    Lower Body Dressing/Undressing Lower body dressing      What is the patient wearing?: Pants     Lower body assist Assist for lower body dressing: Independent with assitive device     Toileting Toileting    Toileting assist Assist for toileting:  (per OT discharge note)     Transfers Chair/bed transfer  Transfers  assist     Chair/bed transfer assist level: Independent with assistive device     Locomotion Ambulation   Ambulation assist      Assist level: Independent with assistive device Assistive device: Walker-rolling Max distance: 75 feet   Walk 10 feet activity   Assist     Assist level: Independent with assistive device Assistive device: Walker-rolling   Walk 50 feet activity   Assist    Assist level: Independent with assistive device Assistive device: Walker-rolling    Walk 150 feet activity   Assist Walk 150 feet activity did not occur: Safety/medical concerns         Walk 10 feet on uneven surface  activity   Assist     Assist level: Independent with assistive device Assistive device: Walker-rolling, Walker-platform   Wheelchair     Assist Is the patient using a wheelchair?: Yes Type of Wheelchair: Manual    Wheelchair assist level: Independent Max wheelchair distance: 150+    Wheelchair 50 feet with 2 turns activity    Assist        Assist Level: Independent   Wheelchair 150 feet activity     Assist      Assist Level: Independent   Blood pressure 124/65, pulse (!) 59, temperature 98.6 F (37 C), resp. rate 16, height 5\' 5"  (1.651 m), weight 125.7 kg, SpO2 100%.  Medical Problem List and Plan: 1. Functional deficits secondary to right ankle fracture s/p ORIF bimalleolar ankle fracture with fixation, right syndesmosis by Dr. Dion Saucier on 12/28/2022         and upper GI bleed patient -patient may  shower if right lower extremity is Covered             -ELOS/Goals: 7 to 10 days, supervision with PT, supervision with OT             -Continue CIR/evaluations today             -Follow-up with orthopedics around 01/11/2023             -Nonweightbearing right lower extremity, elevate right lower extremity when in bed  -DC home today  -Xray R ankle appears stable, f/u with ortho outpatient  2.   Antithrombotics: -DVT/anticoagulation:  Mechanical: Sequential compression devices, below knee Bilateral lower extremities -Avoid pharmacological DVT prophylaxis due to variceal bleeding             -antiplatelet therapy: N/A  3. Pain Management:  Tylenol or oxycodone prn. Ultram added for moderate pain prn.   -8/1 heating pad for lower back pain, discussed trying his muscle  relaxer Flexeril 8/2 Rare use of oxycodone, continue flexeril as needed Not requiring PRN oxycodone or flexeril, cont tylenol PRN 4. Mood/Behavior/Sleep: LCSW to follow for evaluation and support.             --trazodone prn for insomnia.  Has been  sleeping from 9p- 5a (getting used to hospital)             -antipsychotic agents:  N/A 5. Neuropsych/cognition: This patient is capable of making decisions on his own behalf. 6. Skin/Wound Care: Routine pressure relief  7. Fluids/Electrolytes/Nutrition: Monitor I/O. Check CMET in am 8. Displaced Right bimalleolar ankle Fx s/p ORIF 12/28/22: NWB with splint.  9. UGIB s/p esophageal varices banding: Continue to monitor H/H as trending 7.2- 7.7 range.  Continue propranolol. 10. Cirrhosis of the Liver: Continue inderal and PPI.  Contiue Lasix.  Monitor for encephalopathy/hematemesis/melena.              --father had hepatitis w/cirrhosis of liver/mother had liver disease.              --continue to educate on importance of ETOH abstinence (ready to quit)  11. Acute on chronic anemia: Continue iron supplement.    -8/5 Stable HGB at 7.5 12. Thrombocytopenia: Monitor for signs of recurrent bleeding.    -8/1 stable at 125  -8/5 Stable PLT at 128 13. BLE edema: Continue Lasix bid. Elevate BLE when seated. TED on RLE             --low salt diet. Monitor for need of K supplement.   -Recheck BMP today  -8/2 lasix decreased to daily, K+ 20eq daily  8/3-5: will continue daily lasix  14. Morbid obesity: dietary counseling  Body mass index is 47.43 kg/m.  8/3: BMI reviewed and  improved to 46.41  8/4: BMI reviewed and increased to 47.22  15.  Psoriasis  -Patient reporting some discomfort in his hands, continue Voltaren gel.  Consider additional medication if does not improve  16. Hypotension: continue to monitor HR TID, decreased inderal to 10mg  BID, resolved.   -BP stable, encourage fluids    01/12/2023   10:38 AM 01/12/2023    5:19 AM 01/11/2023    9:11 PM  Vitals with BMI  Weight 277 lbs 2 oz    BMI 46.11    Systolic  124 139  Diastolic  65 71  Pulse  59 66     LOS: 5 days A FACE TO FACE EVALUATION WAS PERFORMED  Fanny Dance 01/12/2023, 12:29 PM

## 2023-01-12 NOTE — Progress Notes (Signed)
Patient ID: Thomas [name], male   DOB: 11/23/1975, 47 y.o.   MRN: [id number]  Ortho coming to look at re-casting leg have delayed ambulance to later this afternoon-3:00 pm. Pt aware of plan.

## 2023-01-12 NOTE — Progress Notes (Signed)
Inpatient Rehabilitation Care Coordinator Discharge Note   Patient Details  Name: Thomas [name] MRN: [id number] Date of Birth: Nov 05, 1975   Discharge location: HOME WITH DAD WHO CAN PROVIDE SUPERVISION LEVEL  Length of Stay: 5 DAYS  Discharge activity level: MOD/IWHEELCHAIR LEVEL  Home/community participation: ACTIVE  Patient response WU:JWJXBJ Literacy - How often do you need to have someone help you when you read instructions, pamphlets, or other written material from your doctor or pharmacy?: Never  Patient response YN:WGNFAO Isolation - How often do you feel lonely or isolated from those around you?: Never  Services provided included: MD, RD, PT, OT, RN, CM, Pharmacy, SW  Financial Services:  Financial Services Utilized: Barrister[employer] clerk  Choices offered to/list presented to: PT  Follow-up services arranged:  DME, Patient/Family request agency HH/DME      DME : [facility] RECIVED FROM [facility]. HOSPITAL BED ORDERED WHILE ON REHAB. PT ORDERED PORTABLE RAMP AND WHEELCHAIR HH/DME Requested Agency: [facility]  WILL NEED OUTPATIENT REHAB ONCE CAN WB ON HIS LEG  Patient response to transportation need: Is the patient able to respond to transportation needs?: Yes In the past 12 months, has lack of transportation kept you from medical appointments or from getting medications?: No In the past 12 months, has lack of transportation kept you from meetings, work, or from getting things needed for daily living?: No   Patient/Family verbalized understanding of follow-up arrangements:  Yes  Individual responsible for coordination of the follow-up plan: SELF [phone]  Confirmed correct DME delivered: Lucy Chris 01/12/2023    Comments (or additional information):PT DID WELL AND REACHED HIS GOALS, LIMITED DUE TO WB ISSUES.   Summary of Stay    Date/Time Discharge Planning CSW  01/09/23 0831 Home with dad who can be there and is retired.  His sister and brother in-law will be checking on also. DME ordered and some has been delivered. Will need ramp into home to be able to access it RGD       , Lemar Livings

## 2023-01-12 NOTE — Progress Notes (Signed)
X-rays taken Saturday are stable. Splint removed, wounds without significant drainage. Stitches not quite ready to be removed. Cast placed by ortho techs. Ok to discharge from ortho standpoint with follow-up with Dr. Dion Saucier in 2 weeks for cast removal and stitch removal   Janine Ores, PA-C

## 2023-01-12 NOTE — Progress Notes (Signed)
Inpatient Rehabilitation Discharge Medication Review by a Pharmacist  A complete drug regimen review was completed for this patient to identify any potential clinically significant medication issues.  High Risk Drug Classes Is patient taking? Indication by Medication  Antipsychotic No   Anticoagulant No   Antibiotic No   Opioid Yes PRN Oxycodone - severe pain  Antiplatelet No   Hypoglycemics/insulin No   Vasoactive Medication Yes Propranolol - esophageal varices   Chemotherapy No   Other Yes Pantoprazole - portal hypertensive gastropathy Furosemide - LE edema Potassium chloride, Ferrous sulfate - supplements  Diclofenac gel - topical pain relief  PRNs: Acetaminophen - mild pain Cyclobenzaprine - muscle spasms     Type of Medication Issue Identified Description of Issue Recommendation(s)  Drug Interaction(s) (clinically significant)     Duplicate Therapy     Allergy     No Medication Administration End Date     Incorrect Dose     Additional Drug Therapy Needed     Significant med changes from prior encounter (inform family/care partners about these prior to discharge). Staying off Bupropion XL. PRN ibuprofen discontinued.  Reported not taking. Communicate changes to patient/family at discharge.  Other       Clinically significant medication issues were identified that warrant physician communication and completion of prescribed/recommended actions by midnight of the next day:  No  Pharmacist comments:   - propranolol dose decreased on 8/4; confirmed back to home dose at discharge with P. Love, PA-C.  Time spent performing this drug regimen review (minutes):  20   Dennie Fetters, [place] 01/12/2023 10:19 AM

## 2023-01-14 ENCOUNTER — Telehealth: Payer: Self-pay

## 2023-01-14 NOTE — Transitions of Care (Post Inpatient/ED Visit) (Signed)
   01/14/2023  Name: Thomas [name] MRN: [id number] DOB: 1976-04-15  Today's TOC FU Call Status: Today's TOC FU Call Status:: Successful TOC FU Call Completed TOC FU Call Complete Date: 01/14/23  Transition Care Management Follow-up Telephone Call Date of Discharge: 01/12/23 Discharge Facility: [facility]) Type of Discharge: Inpatient Admission Primary Inpatient Discharge Diagnosis:: Esophageal varices and fractured ankle How have you been since you were released from the hospital?: Better Any questions or concerns?: Yes Patient Questions/Concerns:: Pt unable to refill protonix Patient Questions/Concerns Addressed: Other: (Called pharmacy to determine the issue. Pharmacy reports new PA needed for increased dose. Message routed to PA team.)  Items Reviewed: Did you receive and understand the discharge instructions provided?: Yes Any new allergies since your discharge?: No Dietary orders reviewed?: No Do you have support at home?: Yes People in Home: parent(s)  Medications Reviewed Today: Medications Reviewed Today   Medications were not reviewed in this encounter     Home Care and Equipment/Supplies: Were Home Health Services Ordered?: NA Any new equipment or medical supplies ordered?: Yes Name of Medical supply agency?: [facility] Were you able to get the equipment/medical supplies?: Yes Do you have any questions related to the use of the equipment/supplies?: No  Functional Questionnaire: Do you need assistance with bathing/showering or dressing?: No Do you need assistance with meal preparation?: No Do you need assistance with eating?: No Do you have difficulty maintaining continence: No Do you need assistance with getting out of bed/getting out of a chair/moving?: No Do you have difficulty managing or taking your medications?: No  Follow up appointments reviewed: PCP Follow-up appointment confirmed?: Yes Date of PCP follow-up appointment?:  01/16/23 Follow-up Provider: [facility] Follow-up appointment confirmed?: Yes Date of Specialist follow-up appointment?: 01/21/23 Follow-Up Specialty Provider:: GI Do you need transportation to your follow-up appointment?: No Do you understand care options if your condition(s) worsen?: Yes-patient verbalized understanding    SIGNATURE Arvil Persons, BSN, RN

## 2023-01-15 ENCOUNTER — Other Ambulatory Visit (HOSPITAL_COMMUNITY): Payer: Self-pay

## 2023-01-15 ENCOUNTER — Telehealth: Payer: Self-pay

## 2023-01-15 DIAGNOSIS — I8511 Secondary esophageal varices with bleeding: Secondary | ICD-10-CM | POA: Diagnosis not present

## 2023-01-15 DIAGNOSIS — K922 Gastrointestinal hemorrhage, unspecified: Secondary | ICD-10-CM | POA: Diagnosis not present

## 2023-01-15 DIAGNOSIS — J45909 Unspecified asthma, uncomplicated: Secondary | ICD-10-CM | POA: Diagnosis not present

## 2023-01-15 DIAGNOSIS — K7460 Unspecified cirrhosis of liver: Secondary | ICD-10-CM | POA: Diagnosis not present

## 2023-01-15 NOTE — Telephone Encounter (Signed)
Pharmacy Patient Advocate Encounter  Received notification from [facility] that Prior Authorization for Pantoprazole 40mg  tabs has been APPROVED. Spoke with pharmacy to process.   PA #/Case ID/Reference #: [id number]

## 2023-01-15 NOTE — Telephone Encounter (Signed)
Pharmacy Patient Advocate Encounter   Received notification from Pt Calls Messages that prior authorization for Pantoprazole 40mg  is required/requested.   Insurance verification completed.   The patient is insured through [facility] .   Per test claim: PA required; PA submitted to [facility] via CoverMyMeds Key/confirmation #/EOC [id number] Status is pending

## 2023-01-16 ENCOUNTER — Telehealth: Payer: BC Managed Care – PPO | Admitting: Family Medicine

## 2023-01-16 ENCOUNTER — Encounter: Payer: Self-pay | Admitting: Family Medicine

## 2023-01-16 VITALS — Ht 65.0 in

## 2023-01-16 DIAGNOSIS — S82841D Displaced bimalleolar fracture of right lower leg, subsequent encounter for closed fracture with routine healing: Secondary | ICD-10-CM

## 2023-01-16 DIAGNOSIS — F1090 Alcohol use, unspecified, uncomplicated: Secondary | ICD-10-CM

## 2023-01-16 NOTE — Progress Notes (Signed)
Established Patient Office Visit   Subjective:  Patient ID: Thomas [name], male    DOB: February 25, 1976  Age: 47 y.o. MRN: [id number]  Chief Complaint  Patient presents with   Hospitalization Follow-up    Pt broke his R ankle by slipping on a uneven bricks. Pt fell. X 3 weeks. Pt still has edema. Saw orthopedic surgeon. Has screws in ankle.    HPI Encounter Diagnoses  Name Primary?   Alcohol use disorder Yes   Closed bimalleolar fracture of right ankle with routine healing, subsequent encounter    For follow-up of discharge from rehabilitation center on the 31st of this past July.  He was seen on 15 July diagnosed with a closed by malleolar fracture of his right ankle where it was reduced and casted.  Follow-up was planned with orthopedics.  Leander Rams presented to the emergency room on 18 July with hematemesis.  Underwent EGD with banding of hemorrhaging varices.  Subsequently underwent ORIF of above fracture on the 21st.  He was then discharged to the rehab center as indicated above.  Standing history of alcohol use disorder with sporadic relapses.  Maintains sobriety since 4 July.  He is stable at home currently.   Review of Systems  Constitutional: Negative.   HENT: Negative.    Eyes:  Negative for blurred vision, discharge and redness.  Respiratory: Negative.    Cardiovascular: Negative.   Gastrointestinal:  Negative for abdominal pain.  Genitourinary: Negative.   Musculoskeletal:  Positive for joint pain. Negative for myalgias.  Skin:  Negative for rash.  Neurological:  Negative for tingling, loss of consciousness and weakness.  Endo/Heme/Allergies:  Negative for polydipsia.     Current Outpatient Medications:    acetaminophen (TYLENOL) 325 MG tablet, Take 1-2 tablets (325-650 mg total) by mouth every 4 (four) hours as needed for mild pain., Disp: , Rfl:    cyclobenzaprine (FLEXERIL) 5 MG tablet, Take 1 tablet (5 mg total) by mouth 3 (three) times daily as needed for muscle spasms.,  Disp: 90 tablet, Rfl: 0   diclofenac Sodium (VOLTAREN) 1 % GEL, Apply 2 g topically 4 (four) times daily., Disp: 350 g, Rfl: 0   ferrous sulfate 325 (65 FE) MG tablet, Take 1 tablet (325 mg total) by mouth daily with breakfast., Disp: 30 tablet, Rfl: 3   furosemide (LASIX) 40 MG tablet, Take 1 tablet (40 mg total) by mouth daily., Disp: 30 tablet, Rfl: 0   pantoprazole (PROTONIX) 40 MG tablet, Take 1 tablet (40 mg total) by mouth 2 (two) times daily., Disp: 60 tablet, Rfl: 0   potassium chloride SA (KLOR-CON M) 20 MEQ tablet, Take 1 tablet (20 mEq total) by mouth daily., Disp: 30 tablet, Rfl: 0   propranolol (INDERAL) 20 MG tablet, Take 1 tablet (20 mg total) by mouth 2 (two) times daily., Disp: 60 tablet, Rfl: 0   Objective:     Ht 5\' 5"  (1.651 m)   BMI 46.11 kg/m    Physical Exam Constitutional:      General: He is not in acute distress.    Appearance: Normal appearance. He is not ill-appearing, toxic-appearing or diaphoretic.  HENT:     Head: Normocephalic and atraumatic.     Right Ear: External ear normal.     Left Ear: External ear normal.  Eyes:     General: No scleral icterus.       Right eye: No discharge.        Left eye: No discharge.  Extraocular Movements: Extraocular movements intact.     Conjunctiva/sclera: Conjunctivae normal.  Pulmonary:     Effort: Pulmonary effort is normal. No respiratory distress.  Skin:    General: Skin is warm and dry.  Neurological:     Mental Status: He is alert and oriented to person, place, and time.  Psychiatric:        Mood and Affect: Mood normal.        Behavior: Behavior normal.      No results found for any visits on 01/16/23.    The 10-year ASCVD risk score (Arnett DK, et al., 2019) is: 3.5%    Assessment & Plan:   Alcohol use disorder  Closed bimalleolar fracture of right ankle with routine healing, subsequent encounter    Return Advised to follow-up with me after his ankle heals in the next 6 to 8 weeks..   Continue follow-up with GI as directed.  I asked him to consider inpatient treatment for alcoholism.  Directed him to a local treatment center, [facility].  Advised him to consider Alcoholics Anonymous and discussed ways to find meetings unavailable in our area.  Could consider referral to psychiatry.  Virtual Visit via Video Note  I connected with Thomas [name] on 01/16/23 at 11:00 AM EDT by a video enabled telemedicine application and verified that I am speaking with the correct person using two identifiers.  Location: Patient: home alone in a room Provider: work   I discussed the limitations of evaluation and management by telemedicine and the availability of in person appointments. The patient expressed understanding and agreed to proceed.  History of Present Illness:    Observations/Objective:   Assessment and Plan:   Follow Up Instructions:    I discussed the assessment and treatment plan with the patient. The patient was provided an opportunity to ask questions and all were answered. The patient agreed with the plan and demonstrated an understanding of the instructions.   The patient was advised to call back or seek an in-person evaluation if the symptoms worsen or if the condition fails to improve as anticipated.  I provided 25 minutes of non-face-to-face time during this encounter.   [name], MD   [name], MD

## 2023-01-19 ENCOUNTER — Telehealth: Payer: Self-pay | Admitting: Gastroenterology

## 2023-01-19 NOTE — Telephone Encounter (Signed)
Patient called stated he was recently needing to use a wheel chair but does not have access to a ramp at his house at this time and states he is still having stomach issues not sure if he can be advised further or schedule a virtual visit. Please advise.

## 2023-01-20 ENCOUNTER — Telehealth: Payer: Self-pay | Admitting: *Deleted

## 2023-01-20 NOTE — Telephone Encounter (Signed)
Called patient and left message in reference to his noting that he has no ramp at present as well as not having any transportation. Suggested to the patient on his VM, that he calls [facility] for transpotation. Patient's next appt is scheduled for 01/21/23. Also left message to patient suggesting that he calls his insurance company to discuss any transportation or ramp issues they could possibly help with. Sorry that [facility] can not be of further assistance.

## 2023-01-20 NOTE — Progress Notes (Deleted)
01/20/2023 Thomas [name] [id number] [birth year]  Referring provider: [name],* Primary GI doctor: Dr. Tomasa Rand  ASSESSMENT AND PLAN:   There are no diagnoses linked to this encounter.   Patient Care Team: [name], MD as PCP - General (Family Medicine) [name], MD (Inactive) as Consulting Physician (Dermatology)  HISTORY OF PRESENT ILLNESS: 47 y.o. male with medical history significant for obesity, sleep apnea (does not use CPAP), psoriatic arthritis, alcohol associated cirrhosis, portal hypertensive gastropathy, esophageal and gastric varices .  presents for follow up of cirrhosis secondary to ETOH.  Patient was in the hospital from 01/07/23 to 01/12/23, was in the hospital for vomiting after taking Percocet for a right ankle fracture, emesis described as blood streaked.  Labs in the ED showed a WBC count of 2.75. Hg 7.0 (baseline Hg 11.1 on 08/16/2022). HCT 23.9. MCV 86.9. PLT 176. INR 1.5. Na 135. K 4.5. BUN 28 (BUN 7 on 08/16/2022). Albumin 2.4. T. Bili 2.7. Alk Phos 108. AST 66. ALT 108.  Patient admitted alcohol pending 7 4 through 7 6. 12/26/2022 EGD showed 3 large columns esophageal varices with red wale sign status post 4 band ligator's. Patient was continued on ceftriaxone 5 days, Carafate suspension 1 g every 6 hours continued on propranolol 20 mg p.o. twice daily. Patient's discharge MELD was 16 will need repeat EGD end of this month beginning of next month for surveillance esophageal varices after banding.   09/05/2021 serological workup unremarkable other than very slightly elevated ASMA , IgG 2050, thought secondary to ETOH use, consider liver BX Last EGD was 12/26/2022 needs repeat 3 to 4 weeks for esophageal varices screening after banding.  Last HCC screen: 09/12/2021 patient is overdue Last AFP   Last INR: 12/26/2022 1.5   {cirrhosishepaticencephalopathy:26796} He {Denies/complains:31533} swelling.  He {Actions; are/are not:16769} on  spironolactone and lasix.  Wt Readings from Last 3 Encounters:  01/12/23 277 lb 1.9 oz (125.7 kg)  12/28/22 285 lb (129.3 kg)  12/22/22 285 lb (129.3 kg)    Hepatitis immunity status:  Negative acute hep panel 07/2021 Negative immunity 03/02/2019, received 3 doses Hep B and 2 doses of Hep A  Social history:  Patient denies history of injectable or intranasal drug use, tattoos, high risk sexual behavior, blood transfusion, incarceration, Financial pl[employer], or travel outside the [place].  He {Actions; denies-reports:120008} ETOH use. Denies ever having attended an alcohol treatment program. Denies history of DUI. Denies history of ETOH withdrawal.  12/26/2022 EGD Esophagus: 3 large columns of esophageal varices greater than 5 mm in size, red wale sign.  No active bleeding.  4 band ligator's were deployed for variceal band ligation Stomach: Large clot and altered blood in the fundus, cleared with suction and irrigation, portal hypertensive gastropathy.  No gastric fundic varices. Antral erosion with no active bleeding Duodenum: Visualized portion appeared normal 10/2021 EGD identified grade 2 esophageal varices, portal hypertensive gastropathy, and some gastric polyps.  10/2021  colonoscopy identified a few tubular adenomatous polyps removed from the colon and nonbleeding colonic AVM.   He  reports that he has been smoking cigars. He has never used smokeless tobacco. He reports that he does not currently use alcohol. He reports that he does not use drugs.  RELEVANT LABS AND IMAGING: CBC    Component Value Date/Time   WBC 2.8 (L) 01/12/2023 0613   RBC 3.29 (L) 01/12/2023 0613   HGB 7.5 (L) 01/12/2023 0613   HCT 25.8 (L) 01/12/2023 0613   PLT  123 (L) 01/12/2023 0613   MCV 78.4 (L) 01/12/2023 0613   MCH 22.8 (L) 01/12/2023 0613   MCHC 29.1 (L) 01/12/2023 0613   RDW 19.0 (H) 01/12/2023 0613   LYMPHSABS 0.8 01/08/2023 0606   MONOABS 0.4 01/08/2023 0606   EOSABS 0.3 01/08/2023 0606    BASOSABS 0.0 01/08/2023 0606   Recent Labs    12/27/22 2209 12/28/22 0046 12/29/22 0210 12/29/22 1757 12/30/22 0122 01/03/23 1427 01/06/23 2354 01/08/23 0606 01/09/23 1239 01/12/23 0613  HGB 8.2* 8.1* 7.5* 7.8* 7.7* 7.2* 7.4* 7.3* 7.5* 7.5*    CMP     Component Value Date/Time   NA 142 01/12/2023 0613   K 3.7 01/12/2023 0613   CL 107 01/12/2023 0613   CO2 27 01/12/2023 0613   GLUCOSE 127 (H) 01/12/2023 0613   BUN 12 01/12/2023 0613   CREATININE 0.81 01/12/2023 0613   CREATININE 0.66 06/14/2021 1419   CALCIUM 8.3 (L) 01/12/2023 0613   PROT 5.3 (L) 01/08/2023 0606   ALBUMIN 2.3 (L) 01/08/2023 0606   AST 66 (H) 01/08/2023 0606   ALT 37 01/08/2023 0606   ALKPHOS 125 01/08/2023 0606   BILITOT 1.5 (H) 01/08/2023 0606   GFRNONAA >60 01/12/2023 0613   GFRNONAA >89 06/22/2013 0958   GFRAA >89 06/22/2013 0958      Latest Ref Rng & Units 01/08/2023    6:06 AM 12/26/2022    6:20 AM 12/25/2022    4:08 PM  Hepatic Function  Total Protein 6.5 - 8.1 g/dL 5.3  4.8  5.4   Albumin 3.5 - 5.0 g/dL 2.3  2.2  2.4   AST 15 - 41 U/L 66  51  66   ALT 0 - 44 U/L 37  31  36   Alk Phosphatase 38 - 126 U/L 125  81  108   Total Bilirubin 0.3 - 1.2 mg/dL 1.5  2.6  2.7       Current Medications:    Current Outpatient Medications (Cardiovascular):    furosemide (LASIX) 40 MG tablet, Take 1 tablet (40 mg total) by mouth daily.   propranolol (INDERAL) 20 MG tablet, Take 1 tablet (20 mg total) by mouth 2 (two) times daily.   Current Outpatient Medications (Analgesics):    acetaminophen (TYLENOL) 325 MG tablet, Take 1-2 tablets (325-650 mg total) by mouth every 4 (four) hours as needed for mild pain.  Current Outpatient Medications (Hematological):    ferrous sulfate 325 (65 FE) MG tablet, Take 1 tablet (325 mg total) by mouth daily with breakfast.  Current Outpatient Medications (Other):    cyclobenzaprine (FLEXERIL) 5 MG tablet, Take 1 tablet (5 mg total) by mouth 3 (three) times  daily as needed for muscle spasms.   diclofenac Sodium (VOLTAREN) 1 % GEL, Apply 2 g topically 4 (four) times daily.   pantoprazole (PROTONIX) 40 MG tablet, Take 1 tablet (40 mg total) by mouth 2 (two) times daily.   potassium chloride SA (KLOR-CON M) 20 MEQ tablet, Take 1 tablet (20 mEq total) by mouth daily.  Medical History:  Past Medical History:  Diagnosis Date   Acute conjunctivitis of both eyes 10/22/2021   Acute sinusitis 06/16/2013   Alcoholic cirrhosis (HCC)    Alcoholic fatty liver 01/16/2010   Needs final HBV and HAV vaccines on or after 10/25/2012    Alcoholism (HCC) 12/25/2011   Allergic rhinitis    Childhood asthma    Elevated transaminase level 06/10/2007   AST: 80 ALT: 136 in 8/11: Hepatitis A., B and  C negative.    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    Sleep apnea    Allergies:  Allergies  Allergen Reactions   Codeine Other (See Comments)    Jittery      Surgical History:  He  has a past surgical history that includes Esophagogastroduodenoscopy (egd) with propofol (N/A, 07/05/2022); Esophagogastroduodenoscopy (egd) with propofol (N/A, 12/26/2022); esophageal banding (12/26/2022); and ORIF ankle fracture (Right, 12/28/2022). Family History:  His family history includes Depression in his mother; Diabetes in his father and another family member; Hypertension in his father; Liver disease in his father, mother, and sister.  REVIEW OF SYSTEMS  : All other systems reviewed and negative except where noted in the History of Present Illness.  PHYSICAL EXAM: There were no vitals taken for this visit. General Appearance: Well nourished, in no apparent distress. Head:   Normocephalic and atraumatic. Eyes:  sclerae anicteric,conjunctive pink  Respiratory: Respiratory effort normal,  BS equal bilaterally without rales, rhonchi, wheezing. Cardio: RRR with no MRGs. Peripheral pulses intact.  Abdomen: Soft,  {BlankSingle:19197::"Flat","Obese","Non-distended"} ,active bowel sounds. {actendernessAB:27319} tenderness {anatomy; site abdomen:5010}. {BlankMultiple:19196::"Without guarding","With guarding","Without rebound","With rebound"}. No masses. Rectal: {acrectalexam:27461} Musculoskeletal: Full ROM, {PSY - GAIT AND STATION:22860} gait. {With/Without:304960234} edema. Skin:  Dry and intact without significant lesions or rashes Neuro: Alert and  oriented x4;  No focal deficits. Psych:  Cooperative. Normal mood and affect.    Doree Albee, PA-C 12:42 PM

## 2023-01-21 ENCOUNTER — Ambulatory Visit: Payer: BC Managed Care – PPO | Admitting: Physician Assistant

## 2023-01-21 ENCOUNTER — Telehealth: Payer: Self-pay | Admitting: *Deleted

## 2023-01-21 NOTE — Telephone Encounter (Signed)
Called patient to see if he is interested in re-scheduling his f/u appt. Patient states he is only interested in cancelling his appt for today. He will call back another time to schedule for a f/u appt.

## 2023-01-30 DIAGNOSIS — S82841D Displaced bimalleolar fracture of right lower leg, subsequent encounter for closed fracture with routine healing: Secondary | ICD-10-CM | POA: Diagnosis not present

## 2023-02-02 ENCOUNTER — Other Ambulatory Visit: Payer: Self-pay

## 2023-02-02 ENCOUNTER — Telehealth: Payer: Self-pay | Admitting: Gastroenterology

## 2023-02-02 DIAGNOSIS — D509 Iron deficiency anemia, unspecified: Secondary | ICD-10-CM

## 2023-02-02 DIAGNOSIS — R197 Diarrhea, unspecified: Secondary | ICD-10-CM

## 2023-02-02 DIAGNOSIS — K7030 Alcoholic cirrhosis of liver without ascites: Secondary | ICD-10-CM

## 2023-02-02 DIAGNOSIS — A049 Bacterial intestinal infection, unspecified: Secondary | ICD-10-CM

## 2023-02-02 NOTE — Telephone Encounter (Signed)
Patient contacted. He agrees to this plan of care. He did have one diarrhea stool and 2 soft stools today. He will take home the specimen containers in case the diarrhea continues.

## 2023-02-02 NOTE — Telephone Encounter (Signed)
Patient developed diarrhea. He stopped the Lasix, K+, and his iron supplement. He takes infrequent ibuprofen if the ankle begins to hurt badly. Diarrhea improved. No openings to come in for hospital follow up. He has not updated his labs.  EGD is 02/12/23. Please advise.

## 2023-02-02 NOTE — Telephone Encounter (Signed)
PT would like to speak with nurse to discuss medication and why it is making him sick to his stomach. Please advise.

## 2023-02-02 NOTE — Telephone Encounter (Signed)
Please ask him to come in for labs CBC, CMP, PT/INR. Will do stool GI path panel/C.diff if diarrhea recurs.

## 2023-02-03 ENCOUNTER — Encounter (HOSPITAL_COMMUNITY): Payer: Self-pay | Admitting: Gastroenterology

## 2023-02-03 ENCOUNTER — Other Ambulatory Visit (INDEPENDENT_AMBULATORY_CARE_PROVIDER_SITE_OTHER): Payer: BC Managed Care – PPO

## 2023-02-03 DIAGNOSIS — R197 Diarrhea, unspecified: Secondary | ICD-10-CM

## 2023-02-03 DIAGNOSIS — K7030 Alcoholic cirrhosis of liver without ascites: Secondary | ICD-10-CM | POA: Diagnosis not present

## 2023-02-03 DIAGNOSIS — D509 Iron deficiency anemia, unspecified: Secondary | ICD-10-CM

## 2023-02-03 LAB — CBC WITH DIFFERENTIAL/PLATELET
Basophils Absolute: 0 10*3/uL (ref 0.0–0.1)
Basophils Relative: 1.1 % (ref 0.0–3.0)
Eosinophils Absolute: 0.6 10*3/uL (ref 0.0–0.7)
Eosinophils Relative: 17.5 % — ABNORMAL HIGH (ref 0.0–5.0)
HCT: 28.2 % — ABNORMAL LOW (ref 39.0–52.0)
Hemoglobin: 8.6 g/dL — ABNORMAL LOW (ref 13.0–17.0)
Lymphocytes Relative: 22.8 % (ref 12.0–46.0)
Lymphs Abs: 0.7 10*3/uL (ref 0.7–4.0)
MCHC: 30.4 g/dL (ref 30.0–36.0)
MCV: 77 fl — ABNORMAL LOW (ref 78.0–100.0)
Monocytes Absolute: 0.4 10*3/uL (ref 0.1–1.0)
Monocytes Relative: 13 % — ABNORMAL HIGH (ref 3.0–12.0)
Neutro Abs: 1.5 10*3/uL (ref 1.4–7.7)
Neutrophils Relative %: 45.6 % (ref 43.0–77.0)
Platelets: 103 10*3/uL — ABNORMAL LOW (ref 150.0–400.0)
RBC: 3.66 Mil/uL — ABNORMAL LOW (ref 4.22–5.81)
RDW: 21 % — ABNORMAL HIGH (ref 11.5–15.5)
WBC: 3.3 10*3/uL — ABNORMAL LOW (ref 4.0–10.5)

## 2023-02-03 LAB — COMPREHENSIVE METABOLIC PANEL
ALT: 37 U/L (ref 0–53)
AST: 59 U/L — ABNORMAL HIGH (ref 0–37)
Albumin: 2.7 g/dL — ABNORMAL LOW (ref 3.5–5.2)
Alkaline Phosphatase: 172 U/L — ABNORMAL HIGH (ref 39–117)
BUN: 12 mg/dL (ref 6–23)
CO2: 27 mEq/L (ref 19–32)
Calcium: 8.2 mg/dL — ABNORMAL LOW (ref 8.4–10.5)
Chloride: 109 mEq/L (ref 96–112)
Creatinine, Ser: 0.66 mg/dL (ref 0.40–1.50)
GFR: 113.3 mL/min (ref >60.00)
Glucose, Bld: 80 mg/dL (ref 70–99)
Potassium: 4 mEq/L (ref 3.5–5.1)
Sodium: 142 mEq/L (ref 135–145)
Total Bilirubin: 1.8 mg/dL — ABNORMAL HIGH (ref 0.2–1.2)
Total Protein: 5.7 g/dL — ABNORMAL LOW (ref 6.0–8.3)

## 2023-02-03 LAB — PROTIME-INR
INR: 1.7 ratio — ABNORMAL HIGH (ref 0.8–1.0)
Prothrombin Time: 17.3 s — ABNORMAL HIGH (ref 9.6–13.1)

## 2023-02-05 ENCOUNTER — Other Ambulatory Visit: Payer: BC Managed Care – PPO

## 2023-02-05 ENCOUNTER — Other Ambulatory Visit: Payer: Self-pay | Admitting: Gastroenterology

## 2023-02-05 DIAGNOSIS — D509 Iron deficiency anemia, unspecified: Secondary | ICD-10-CM

## 2023-02-05 DIAGNOSIS — R197 Diarrhea, unspecified: Secondary | ICD-10-CM | POA: Diagnosis not present

## 2023-02-05 DIAGNOSIS — K7030 Alcoholic cirrhosis of liver without ascites: Secondary | ICD-10-CM

## 2023-02-06 LAB — CLOSTRIDIUM DIFFICILE BY PCR: Toxigenic C. Difficile by PCR: NEGATIVE

## 2023-02-06 LAB — SPECIMEN STATUS REPORT

## 2023-02-10 ENCOUNTER — Other Ambulatory Visit: Payer: Self-pay

## 2023-02-10 ENCOUNTER — Telehealth: Payer: Self-pay | Admitting: Gastroenterology

## 2023-02-10 LAB — STOOL CULTURE: E coli, Shiga toxin Assay: NEGATIVE

## 2023-02-10 NOTE — Telephone Encounter (Signed)
PT is calling to go over lab results. He also has a procedure at [facility] on 9/5 but his instruction are for 1115am and it now says 12pm. He needs new instructions sent to his mychart. Please advise.

## 2023-02-10 NOTE — Telephone Encounter (Signed)
Patient has had very bad diarrhea over the past few days, I told him he could take an extra Imodium. He was asking about his labs and the results.  His time at the hospital was moved up to 12pm instead of 11:15am. We discussed his change of times and the prep.

## 2023-02-10 NOTE — Telephone Encounter (Signed)
Spoke with the patient. Advised NPO starting at 8:00 am and arrive to [facility] at 10:30 am. Also advised new instructions are in his My Chart for his review.

## 2023-02-11 NOTE — Telephone Encounter (Signed)
Called patient back and went over his labs with him and to continue Imodium until after his procedure

## 2023-02-12 ENCOUNTER — Ambulatory Visit (HOSPITAL_COMMUNITY): Payer: BC Managed Care – PPO

## 2023-02-12 ENCOUNTER — Encounter (HOSPITAL_COMMUNITY)
Admission: RE | Disposition: A | Discharge status: Home / Self Care | Payer: Self-pay | Source: Home / Self Care | Attending: Gastroenterology

## 2023-02-12 ENCOUNTER — Ambulatory Visit (HOSPITAL_COMMUNITY)
Admission: RE | Admit: 2023-02-12 | Discharge: 2023-02-12 | Disposition: A | Discharge status: Home / Self Care | Payer: BC Managed Care – PPO | Attending: Gastroenterology | Admitting: Gastroenterology

## 2023-02-12 ENCOUNTER — Other Ambulatory Visit: Payer: Self-pay

## 2023-02-12 ENCOUNTER — Encounter (HOSPITAL_COMMUNITY): Payer: Self-pay | Admitting: Gastroenterology

## 2023-02-12 DIAGNOSIS — D649 Anemia, unspecified: Secondary | ICD-10-CM | POA: Diagnosis not present

## 2023-02-12 DIAGNOSIS — M1990 Unspecified osteoarthritis, unspecified site: Secondary | ICD-10-CM | POA: Insufficient documentation

## 2023-02-12 DIAGNOSIS — K3189 Other diseases of stomach and duodenum: Secondary | ICD-10-CM | POA: Diagnosis not present

## 2023-02-12 DIAGNOSIS — K7460 Unspecified cirrhosis of liver: Secondary | ICD-10-CM | POA: Diagnosis not present

## 2023-02-12 DIAGNOSIS — I8510 Secondary esophageal varices without bleeding: Secondary | ICD-10-CM

## 2023-02-12 DIAGNOSIS — F17290 Nicotine dependence, other tobacco product, uncomplicated: Secondary | ICD-10-CM | POA: Diagnosis not present

## 2023-02-12 DIAGNOSIS — G4730 Sleep apnea, unspecified: Secondary | ICD-10-CM | POA: Insufficient documentation

## 2023-02-12 DIAGNOSIS — I8500 Esophageal varices without bleeding: Secondary | ICD-10-CM

## 2023-02-12 DIAGNOSIS — K7030 Alcoholic cirrhosis of liver without ascites: Secondary | ICD-10-CM | POA: Insufficient documentation

## 2023-02-12 DIAGNOSIS — K766 Portal hypertension: Secondary | ICD-10-CM | POA: Diagnosis not present

## 2023-02-12 DIAGNOSIS — D509 Iron deficiency anemia, unspecified: Secondary | ICD-10-CM

## 2023-02-12 DIAGNOSIS — I10 Essential (primary) hypertension: Secondary | ICD-10-CM | POA: Diagnosis not present

## 2023-02-12 DIAGNOSIS — K920 Hematemesis: Secondary | ICD-10-CM

## 2023-02-12 DIAGNOSIS — F17200 Nicotine dependence, unspecified, uncomplicated: Secondary | ICD-10-CM | POA: Diagnosis not present

## 2023-02-12 HISTORY — PX: ESOPHAGOGASTRODUODENOSCOPY (EGD) WITH PROPOFOL: SHX5813

## 2023-02-12 SURGERY — ESOPHAGOGASTRODUODENOSCOPY (EGD) WITH PROPOFOL
Anesthesia: Monitor Anesthesia Care

## 2023-02-12 MED ORDER — PANTOPRAZOLE SODIUM 40 MG PO TBEC: 40.0000 mg | DELAYED_RELEASE_TABLET | Freq: Every day | ORAL | 3 refills | Status: DC

## 2023-02-12 MED ORDER — PROPOFOL 500 MG/50ML IV EMUL
INTRAVENOUS | Status: DC | PRN
Start: 2023-02-12 — End: 2023-02-12
  Administered 2023-02-12: 140 ug/kg/min via INTRAVENOUS

## 2023-02-12 MED ORDER — PANCRELIPASE (LIP-PROT-AMYL) 36000-114000 UNITS PO CPEP: ORAL_CAPSULE | ORAL | 11 refills | Status: DC

## 2023-02-12 MED ORDER — LIDOCAINE HCL (CARDIAC) PF 100 MG/5ML IV SOSY
PREFILLED_SYRINGE | INTRAVENOUS | Status: DC | PRN
  Administered 2023-02-12: 100 mg via INTRAVENOUS

## 2023-02-12 MED ORDER — SODIUM CHLORIDE 0.9 % IV SOLN: INTRAVENOUS | Status: DC

## 2023-02-12 MED ORDER — LACTATED RINGERS IV SOLN: INTRAVENOUS | Status: DC

## 2023-02-12 MED ORDER — PROPOFOL 10 MG/ML IV BOLUS
INTRAVENOUS | Status: DC | PRN
Start: 2023-02-12 — End: 2023-02-12
  Administered 2023-02-12: 100 mg via INTRAVENOUS

## 2023-02-12 MED ORDER — NADOLOL 20 MG PO TABS: 20.0000 mg | ORAL_TABLET | Freq: Every day | ORAL | 11 refills | Status: DC

## 2023-02-12 SURGICAL SUPPLY — 15 items

## 2023-02-12 NOTE — Op Note (Signed)
[facility] Patient Name: Thomas [name] Procedure Date: 02/12/2023 MRN: [id number] Attending MD: Napoleon Form , MD, [id number] Date of Birth: 21-Nov-1975 CSN: [id number] Age: 47 Admit Type: Outpatient Procedure:                Upper GI endoscopy Indications:              Follow-up of esophageal varices in patient with                            suspected portal hypertension Providers:                Napoleon Form, MD, Doristine Mango, RN, Beryle Beams, Arvil Persons Star CRNA Referring MD:              Medicines:                Monitored Anesthesia Care Complications:            No immediate complications. Estimated Blood Loss:     Estimated blood loss was minimal. Procedure:                Pre-Anesthesia Assessment:                           - Prior to the procedure, a History and Physical                            was performed, and patient medications and                            allergies were reviewed. The patient's tolerance of                            previous anesthesia was also reviewed. The risks                            and benefits of the procedure and the sedation                            options and risks were discussed with the patient.                            All questions were answered, and informed consent                            was obtained. Prior Anticoagulants: The patient has                            taken no anticoagulant or antiplatelet agents. ASA                            Grade Assessment: III - A patient with severe  systemic disease. After reviewing the risks and                            benefits, the patient was deemed in satisfactory                            condition to undergo the procedure.                           After obtaining informed consent, the endoscope was                            passed under direct vision. Throughout the                             procedure, the patient's blood pressure, pulse, and                            oxygen saturations were monitored continuously. The                            [id number]-[id number] ([id number]) Olympus endoscope was introduced                            through the mouth, and advanced to the second part                            of duodenum. The upper GI endoscopy was                            accomplished without difficulty. The patient                            tolerated the procedure well. Scope In: Scope Out: Findings:      Three columns of small (< 5 mm) varices with no bleeding and no stigmata       of recent bleeding were found in the lower third of the esophagus. They       were less than 5 mm in largest diameter. No red wale signs were present.      Mild portal hypertensive gastropathy was found in the entire examined       stomach.      Striped moderately erythematous mucosa without bleeding was found in the       prepyloric region of the stomach. ?Isolated gastric varices      The cardia and gastric fundus were normal on retroflexion.      The examined duodenum was normal. Impression:               - Small (< 5 mm) esophageal varices with no                            bleeding and no stigmata of recent bleeding.                           - Portal hypertensive gastropathy.                           -  Erythematous mucosa in the prepyloric region of                            the stomach.                           - Normal examined duodenum.                           - No specimens collected. Moderate Sedation:      N/A Recommendation:           - Patient has a contact number available for                            emergencies. The signs and symptoms of potential                            delayed complications were discussed with the                            patient. Return to normal activities tomorrow.                            Written discharge instructions were  provided to the                            patient.                           - Resume previous diet.                           - Continue present medications.                           - Nadolol 20mg  daily and titrate to goal HR 50-60                           - Return to GI office in 3 months with Dr.Julicia Krieger                            or APP.                           - Creon 72000 units with meals three times daily                            for pancreatic insufficiency and chronic diarrhea Procedure Code(s):        --- Professional ---                           [phone], Esophagogastroduodenoscopy, flexible,                            transoral; diagnostic, including collection of  specimen(s) by brushing or washing, when performed                            (separate procedure) Diagnosis Code(s):        --- Professional ---                           I85.00, Esophageal varices without bleeding                           K76.6, Portal hypertension                           K31.89, Other diseases of stomach and duodenum CPT copyright 2022 American [employer] Association. All rights reserved. The codes documented in this report are preliminary and upon coder review may  be revised to meet current compliance requirements. Napoleon Form, MD 02/12/2023 1:08:19 PM This report has been signed electronically. Number of Addenda: 0

## 2023-02-12 NOTE — Anesthesia Preprocedure Evaluation (Signed)
Anesthesia Evaluation  Patient identified by MRN, date of birth, ID band Patient awake    Reviewed: Allergy & Precautions, H&P , NPO status , Patient's Chart, lab work & pertinent test results  Airway Mallampati: II  TM Distance: >3 FB Neck ROM: Full    Dental no notable dental hx.    Pulmonary asthma , sleep apnea , Current Smoker   Pulmonary exam normal breath sounds clear to auscultation       Cardiovascular negative cardio ROS Normal cardiovascular exam Rhythm:Regular Rate:Normal     Neuro/Psych negative neurological ROS  negative psych ROS   GI/Hepatic ,,,(+) Cirrhosis       Upper GI bleed Alcohol use disorder with associated cirrhosis and varices   Endo/Other  negative endocrine ROS    Renal/GU negative Renal ROS  negative genitourinary   Musculoskeletal  (+) Arthritis ,    Abdominal   Peds negative pediatric ROS (+)  Hematology  (+) Blood dyscrasia, anemia Thrombocytopenia   Anesthesia Other Findings   Reproductive/Obstetrics negative OB ROS                             Anesthesia Physical Anesthesia Plan  ASA: 3  Anesthesia Plan: MAC   Post-op Pain Management:    Induction: Intravenous  PONV Risk Score and Plan: Propofol infusion and Treatment may vary due to age or medical condition  Airway Management Planned: Natural Airway  Additional Equipment:   Intra-op Plan:   Post-operative Plan: Extubation in OR  Informed Consent: I have reviewed the patients History and Physical, chart, labs and discussed the procedure including the risks, benefits and alternatives for the proposed anesthesia with the patient or authorized representative who has indicated his/her understanding and acceptance.     Dental advisory given  Plan Discussed with: CRNA  Anesthesia Plan Comments:        Anesthesia Quick Evaluation

## 2023-02-12 NOTE — Transfer of Care (Signed)
Immediate Anesthesia Transfer of Care Note  Patient: Thomas [name]  Procedure(s) Performed: ESOPHAGOGASTRODUODENOSCOPY (EGD) WITH PROPOFOL  Patient Location: PACU  Anesthesia Type:MAC  Level of Consciousness: awake, alert , and oriented  Airway & Oxygen Therapy: Patient Spontanous Breathing  Post-op Assessment: Report given to RN and Post -op Vital signs reviewed and stable  Post vital signs: Reviewed and stable  Last Vitals:  Vitals Value Taken Time  BP    Temp    Pulse 80 02/12/23 1308  Resp 22 02/12/23 1308  SpO2 99 % 02/12/23 1308  Vitals shown include unfiled device data.  Last Pain:  Vitals:   02/12/23 1146  TempSrc: Temporal  PainSc: 0-No pain         Complications: No notable events documented.

## 2023-02-12 NOTE — Discharge Instructions (Signed)

## 2023-02-12 NOTE — Anesthesia Postprocedure Evaluation (Signed)
Anesthesia Post Note  Patient: Thomas [name]  Procedure(s) Performed: ESOPHAGOGASTRODUODENOSCOPY (EGD) WITH PROPOFOL     Patient location during evaluation: PACU Anesthesia Type: MAC Level of consciousness: awake and alert Pain management: pain level controlled Vital Signs Assessment: post-procedure vital signs reviewed and stable Respiratory status: spontaneous breathing, nonlabored ventilation, respiratory function stable and patient connected to nasal cannula oxygen Cardiovascular status: stable and blood pressure returned to baseline Postop Assessment: no apparent nausea or vomiting Anesthetic complications: no   No notable events documented.  Last Vitals:  Vitals:   02/12/23 1320 02/12/23 1330  BP: (!) 113/57 (!) 120/53  Pulse: 70 67  Resp: (!) 21 18  Temp:    SpO2: 97% 100%    Last Pain:  Vitals:   02/12/23 1330  TempSrc:   PainSc: 0-No pain                 [place]

## 2023-02-12 NOTE — H&P (Addendum)
[facility] History and Physical   Primary Care Physician:  [name], MD   Reason for Procedure:   Surveillance of esophageal varices  Plan:    EGD with possible intervention     HPI: Thomas [name] is a 47 y.o. male here for surveillance EGD for follow up of esophageal varices. The risks and benefits as well as alternatives of endoscopic procedure(s) have been discussed and reviewed. All questions answered. The patient agrees to proceed.    Past Medical History:  Diagnosis Date   Acute conjunctivitis of both eyes 10/22/2021   Acute sinusitis 06/16/2013   Alcoholic cirrhosis (HCC)    Alcoholic fatty liver 01/16/2010   Needs final HBV and HAV vaccines on or after 10/25/2012    Alcoholism (HCC) 12/25/2011   Allergic rhinitis    Childhood asthma    Elevated transaminase level 06/10/2007   AST: 80 ALT: 136 in 8/11: Hepatitis A., B and C negative.    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    Sleep apnea     Past Surgical History:  Procedure Laterality Date   ESOPHAGEAL BANDING  12/26/2022   Procedure: ESOPHAGEAL BANDING;  Surgeon: Napoleon Form, MD;  Location: [facility];  Service: Gastroenterology;;   ESOPHAGOGASTRODUODENOSCOPY (EGD) WITH PROPOFOL N/A 07/05/2022   Procedure: ESOPHAGOGASTRODUODENOSCOPY (EGD) WITH PROPOFOL;  Surgeon: Napoleon Form, MD;  Location: [facility];  Service: Gastroenterology;  Laterality: N/A;   ESOPHAGOGASTRODUODENOSCOPY (EGD) WITH PROPOFOL N/A 12/26/2022   Procedure: ESOPHAGOGASTRODUODENOSCOPY (EGD) WITH PROPOFOL;  Surgeon: Napoleon Form, MD;  Location: [facility];  Service: Gastroenterology;  Laterality: N/A;   ORIF ANKLE FRACTURE Right 12/28/2022   Procedure: OPEN REDUCTION INTERNAL FIXATION (ORIF) ANKLE  FRACTURE;  Surgeon: Teryl Lucy, MD;  Location: [facility] OR;  Service: Orthopedics;  Laterality: Right;    Prior to Admission medications   Medication Sig Start Date End Date Taking? Authorizing Provider  acetaminophen (TYLENOL) 325 MG tablet Take 1-2 tablets (325-650 mg total) by mouth every 4 (four) hours as needed for mild pain. 01/08/23   Love, Evlyn Kanner, PA-C  cyclobenzaprine (FLEXERIL) 5 MG tablet Take 1 tablet (5 mg total) by mouth 3 (three) times daily as needed for muscle spasms. 01/12/23   Love, Evlyn Kanner, PA-C  diclofenac Sodium (VOLTAREN) 1 % GEL Apply 2 g topically 4 (four) times daily. 01/12/23   Love, Evlyn Kanner, PA-C  ferrous sulfate 325 (65 FE) MG tablet Take 1 tablet (325 mg total) by mouth daily with breakfast. 01/12/23   Love, Evlyn Kanner, PA-C  furosemide (LASIX) 40 MG tablet Take 1 tablet (40 mg total) by mouth daily. 01/12/23   Love, Evlyn Kanner, PA-C  pantoprazole (PROTONIX) 40 MG tablet Take 1 tablet (40 mg total) by mouth 2 (two) times daily. 01/12/23   Love, Evlyn Kanner, PA-C  potassium chloride SA (KLOR-CON M) 20 MEQ tablet Take 1 tablet (20 mEq total) by mouth daily. 01/13/23   Love, Evlyn Kanner, PA-C  propranolol (INDERAL) 20 MG tablet Take 1 tablet (20 mg total) by mouth 2 (two) times daily. 01/12/23   Jacquelynn Cree, PA-C    Current Facility-Administered Medications  Medication Dose Route Frequency Provider Last Rate Last Admin   0.9 %  sodium chloride infusion   Intravenous Continuous Thai Hemrick, Eleonore Chiquito, MD  Allergies as of 12/30/2022 - Review Complete 12/28/2022  Allergen Reaction Noted   Codeine Other (See Comments) 02/13/2015    Family History  Problem Relation Age of Onset   Liver disease Mother    Depression Mother    Liver disease Father    Diabetes Father    Hypertension Father    Liver disease Sister    Diabetes Other    Colon cancer Neg Hx    Esophageal cancer Neg Hx    Stomach cancer Neg Hx    Colon polyps Neg Hx     Social History   Socioeconomic History    Marital status: Single    Spouse name: Not on file   Number of children: Not on file   Years of education: Not on file   Highest education level: Not on file  Occupational History   Not on file  Tobacco Use   Smoking status: Some Days    Types: Cigars   Smokeless tobacco: Never   Tobacco comments:    Cigars occasional  Vaping Use   Vaping status: Never Used  Substance and Sexual Activity   Alcohol use: Not Currently    Comment: Beer/wine 1 - 3 times a week (drinks heavily on weekends)   Drug use: Never    Comment: history of cocaine abuse   Sexual activity: Yes  Other Topics Concern   Not on file  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             Social Determinants of Health   Financial Resource Strain: Not on file  Food Insecurity: No Food Insecurity (12/29/2022)   Hunger Vital Sign    Worried About Running Out of Food in the Last Year: Never true    Ran Out of Food in the Last Year: Never true  Transportation Needs: No Transportation Needs (12/29/2022)   PRAPARE - Administrator, Civil Service (Medical): No    Lack of Transportation (Non-Medical): No  Physical Activity: Not on file  Stress: Not on file  Social Connections: Not on file  Intimate Partner Violence: Not At Risk (12/29/2022)   Humiliation, Afraid, Rape, and Kick questionnaire    Fear of Current or Ex-Partner: No    Emotionally Abused: No    Physically Abused: No    Sexually Abused: No    Review of Systems:  All other review of systems negative except as mentioned in the HPI.  Physical Exam: Vital signs in last 24 hours:     General:   Alert, NAD Lungs:  Clear .   Heart:  Regular rate and rhythm Abdomen:  Soft, nontender and nondistended. Neuro/Psych:  Alert and cooperative. Normal mood and affect. A and O x 3   K. Scherry Ran , MD [phone]

## 2023-02-13 DIAGNOSIS — I8500 Esophageal varices without bleeding: Secondary | ICD-10-CM

## 2023-02-15 DIAGNOSIS — K7460 Unspecified cirrhosis of liver: Secondary | ICD-10-CM | POA: Diagnosis not present

## 2023-02-15 DIAGNOSIS — J45909 Unspecified asthma, uncomplicated: Secondary | ICD-10-CM | POA: Diagnosis not present

## 2023-02-15 DIAGNOSIS — I8511 Secondary esophageal varices with bleeding: Secondary | ICD-10-CM | POA: Diagnosis not present

## 2023-02-15 DIAGNOSIS — K922 Gastrointestinal hemorrhage, unspecified: Secondary | ICD-10-CM | POA: Diagnosis not present

## 2023-02-16 ENCOUNTER — Encounter (HOSPITAL_COMMUNITY): Payer: Self-pay | Admitting: Gastroenterology

## 2023-02-19 ENCOUNTER — Telehealth: Payer: Self-pay | Admitting: Gastroenterology

## 2023-02-19 NOTE — Telephone Encounter (Signed)
Patient called states he recently had a procedure done and the medication given to stop the diarrhea has not helped, also has some swelling on the left side of his abdomin area.

## 2023-02-20 ENCOUNTER — Encounter (HOSPITAL_COMMUNITY): Payer: Self-pay | Admitting: *Deleted

## 2023-02-20 ENCOUNTER — Other Ambulatory Visit: Payer: Self-pay

## 2023-02-20 ENCOUNTER — Emergency Department (HOSPITAL_COMMUNITY): Payer: BC Managed Care – PPO

## 2023-02-20 ENCOUNTER — Emergency Department (HOSPITAL_COMMUNITY)
Admission: EM | Admit: 2023-02-20 | Discharge: 2023-02-21 | Disposition: A | Discharge status: Home / Self Care | Payer: BC Managed Care – PPO | Attending: Emergency Medicine | Admitting: Emergency Medicine

## 2023-02-20 DIAGNOSIS — R600 Localized edema: Secondary | ICD-10-CM | POA: Diagnosis not present

## 2023-02-20 DIAGNOSIS — K766 Portal hypertension: Secondary | ICD-10-CM | POA: Diagnosis not present

## 2023-02-20 DIAGNOSIS — R197 Diarrhea, unspecified: Secondary | ICD-10-CM | POA: Insufficient documentation

## 2023-02-20 DIAGNOSIS — R188 Other ascites: Secondary | ICD-10-CM | POA: Diagnosis not present

## 2023-02-20 DIAGNOSIS — R1032 Left lower quadrant pain: Secondary | ICD-10-CM | POA: Diagnosis not present

## 2023-02-20 DIAGNOSIS — R161 Splenomegaly, not elsewhere classified: Secondary | ICD-10-CM | POA: Diagnosis not present

## 2023-02-20 DIAGNOSIS — K7460 Unspecified cirrhosis of liver: Secondary | ICD-10-CM | POA: Diagnosis not present

## 2023-02-20 LAB — CBC
HCT: 30.3 % — ABNORMAL LOW (ref 39.0–52.0)
Hemoglobin: 8.9 g/dL — ABNORMAL LOW (ref 13.0–17.0)
MCH: 23.7 pg — ABNORMAL LOW (ref 26.0–34.0)
MCHC: 29.4 g/dL — ABNORMAL LOW (ref 30.0–36.0)
MCV: 80.8 fL (ref 80.0–100.0)
Platelets: 109 10*3/uL — ABNORMAL LOW (ref 150–400)
RBC: 3.75 MIL/uL — ABNORMAL LOW (ref 4.22–5.81)
RDW: 17.2 % — ABNORMAL HIGH (ref 11.5–15.5)
WBC: 3.8 10*3/uL — ABNORMAL LOW (ref 4.0–10.5)
nRBC: 0 % (ref 0.0–0.2)

## 2023-02-20 LAB — COMPREHENSIVE METABOLIC PANEL
ALT: 41 U/L (ref 0–44)
AST: 71 U/L — ABNORMAL HIGH (ref 15–41)
Albumin: 2.7 g/dL — ABNORMAL LOW (ref 3.5–5.0)
Alkaline Phosphatase: 158 U/L — ABNORMAL HIGH (ref 38–126)
Anion gap: 5 (ref 5–15)
BUN: 9 mg/dL (ref 6–20)
CO2: 27 mmol/L (ref 22–32)
Calcium: 8.4 mg/dL — ABNORMAL LOW (ref 8.9–10.3)
Chloride: 106 mmol/L (ref 98–111)
Creatinine, Ser: 0.55 mg/dL — ABNORMAL LOW (ref 0.61–1.24)
GFR, Estimated: >60 mL/min (ref >60)
Glucose, Bld: 92 mg/dL (ref 70–99)
Potassium: 4 mmol/L (ref 3.5–5.1)
Sodium: 138 mmol/L (ref 135–145)
Total Bilirubin: 2.3 mg/dL — ABNORMAL HIGH (ref 0.3–1.2)
Total Protein: 6.3 g/dL — ABNORMAL LOW (ref 6.5–8.1)

## 2023-02-20 LAB — URINALYSIS, ROUTINE W REFLEX MICROSCOPIC
Bilirubin Urine: NEGATIVE
Glucose, UA: NEGATIVE mg/dL
Hgb urine dipstick: NEGATIVE
Ketones, ur: NEGATIVE mg/dL
Leukocytes,Ua: NEGATIVE
Nitrite: NEGATIVE
Protein, ur: NEGATIVE mg/dL
Specific Gravity, Urine: 1.015 (ref 1.005–1.030)
pH: 6 (ref 5.0–8.0)

## 2023-02-20 LAB — LIPASE, BLOOD: Lipase: 50 U/L (ref 11–51)

## 2023-02-20 MED ORDER — IOHEXOL 300 MG/ML  SOLN
100.0000 mL | Freq: Once | INTRAMUSCULAR | Status: AC | PRN
  Administered 2023-02-20: 100 mL via INTRAVENOUS

## 2023-02-20 MED ORDER — FUROSEMIDE 10 MG/ML IJ SOLN
40.0000 mg | Freq: Once | INTRAMUSCULAR | Status: AC
  Administered 2023-02-20: 40 mg via INTRAVENOUS
  Filled 2023-02-20: qty 4

## 2023-02-20 NOTE — ED Triage Notes (Signed)
Routine EGD last week and has had diarrhea and left abd swelling and pain since, Called office and was told to come to the ED. Increased fatigue

## 2023-02-20 NOTE — Telephone Encounter (Signed)
PT is calling back to get an update on recommendations for relief. Please advise.

## 2023-02-20 NOTE — ED Provider Notes (Signed)
[place] [facility] AT [facility] Provider Note   CSN: [id number] Arrival date & time: 02/20/23  1449     History  Chief Complaint  Patient presents with   Abdominal Pain   Diarrhea    Thomas [name] is a 47 y.o. male.  47 year old male with a past medical history of esophageal varices presents to the ED with a chief complaint of abdominal distention and pain since the surgery.  Patient reports he had an EGD performed last Thursday, has had continuous abdominal distention.  He notices that the left side is more swollen, he feels like his this side is more distended. He also endorses ongoing episodes of diarrhea but no blood has been present in his stool. He called his gastroenterologist Dr. Lavon Paganini who recommended he be evaluated in the ED. No fever, no chills, no vomiting.   The history is provided by the patient.  Abdominal Pain Pain location:  LLQ Pain quality: aching   Pain radiates to:  Does not radiate Associated symptoms: diarrhea   Associated symptoms: no chest pain, no chills, no fever, no shortness of breath and no vomiting   Diarrhea Associated symptoms: abdominal pain   Associated symptoms: no chills, no fever, no headaches and no vomiting        Home Medications Prior to Admission medications   Medication Sig Start Date End Date Taking? Authorizing Provider  acetaminophen (TYLENOL) 325 MG tablet Take 1-2 tablets (325-650 mg total) by mouth every 4 (four) hours as needed for mild pain. 01/08/23   Love, Evlyn Kanner, PA-C  cyclobenzaprine (FLEXERIL) 5 MG tablet Take 1 tablet (5 mg total) by mouth 3 (three) times daily as needed for muscle spasms. 01/12/23   Love, Evlyn Kanner, PA-C  diclofenac Sodium (VOLTAREN) 1 % GEL Apply 2 g topically 4 (four) times daily. 01/12/23   Love, Evlyn Kanner, PA-C  ferrous sulfate 325 (65 FE) MG tablet Take 1 tablet (325 mg total) by mouth daily with breakfast. 01/12/23   Love, Evlyn Kanner, PA-C  furosemide (LASIX) 40 MG tablet  Take 1 tablet (40 mg total) by mouth daily. 01/12/23   Love, Evlyn Kanner, PA-C  lipase/protease/amylase (CREON) 36000 UNITS CPEP capsule Take 2 capsules (72,000 Units total) by mouth 3 (three) times daily with meals. May also take 1 capsule (36,000 Units total) as needed (with snacks). 02/12/23   Napoleon Form, MD  nadolol (CORGARD) 20 MG tablet Take 1 tablet (20 mg total) by mouth daily. 02/12/23 02/12/24  Napoleon Form, MD  pantoprazole (PROTONIX) 40 MG tablet Take 1 tablet (40 mg total) by mouth daily. 02/12/23 02/12/24  Napoleon Form, MD  potassium chloride SA (KLOR-CON M) 20 MEQ tablet Take 1 tablet (20 mEq total) by mouth daily. 01/13/23   LoveEvlyn Kanner, PA-C      Allergies    Codeine    Review of Systems   Review of Systems  Constitutional:  Negative for chills and fever.  Respiratory:  Negative for shortness of breath.   Cardiovascular:  Negative for chest pain.  Gastrointestinal:  Positive for abdominal pain and diarrhea. Negative for vomiting.  Genitourinary:  Negative for flank pain.  Musculoskeletal:  Negative for back pain.  Neurological:  Negative for headaches.  All other systems reviewed and are negative.   Physical Exam Updated Vital Signs BP (!) 147/77 (BP Location: Right Arm)   Pulse 71   Temp 98.4 F (36.9 C) (Oral)   Resp 18   Ht 5'  5" (1.651 m)   Wt 120.2 kg   SpO2 100%   BMI 44.10 kg/m  Physical Exam Vitals and nursing note reviewed.  Constitutional:      Appearance: He is well-developed. He is obese.  HENT:     Head: Normocephalic and atraumatic.  Cardiovascular:     Rate and Rhythm: Normal rate.     Comments: Left lower 2+ pitting edema, unable to compare to the right as he has a cast to the right lower leg. Pulmonary:     Effort: Pulmonary effort is normal.     Breath sounds: No wheezing.  Abdominal:     General: Abdomen is flat. Bowel sounds are normal. There is distension.     Palpations: Abdomen is soft.     Tenderness: There is  abdominal tenderness in the left lower quadrant.  Musculoskeletal:     Right lower leg: No edema.     Left lower leg: 2+ Pitting Edema present.  Skin:    General: Skin is warm and dry.  Neurological:     Mental Status: He is alert.     ED Results / Procedures / Treatments   Labs (all labs ordered are listed, but only abnormal results are displayed) Labs Reviewed  COMPREHENSIVE METABOLIC PANEL - Abnormal; Notable for the following components:      Result Value   Creatinine, Ser 0.55 (*)    Calcium 8.4 (*)    Total Protein 6.3 (*)    Albumin 2.7 (*)    AST 71 (*)    Alkaline Phosphatase 158 (*)    Total Bilirubin 2.3 (*)    All other components within normal limits  CBC - Abnormal; Notable for the following components:   WBC 3.8 (*)    RBC 3.75 (*)    Hemoglobin 8.9 (*)    HCT 30.3 (*)    MCH 23.7 (*)    MCHC 29.4 (*)    RDW 17.2 (*)    Platelets 109 (*)    All other components within normal limits  URINALYSIS, ROUTINE W REFLEX MICROSCOPIC - Abnormal; Notable for the following components:   Color, Urine AMBER (*)    All other components within normal limits  LIPASE, BLOOD    EKG None  Radiology CT ABDOMEN PELVIS W CONTRAST  Result Date: 02/20/2023 CLINICAL DATA:  Left-sided abdominal pain diarrhea EXAM: CT ABDOMEN AND PELVIS WITH CONTRAST TECHNIQUE: Multidetector CT imaging of the abdomen and pelvis was performed using the standard protocol following bolus administration of intravenous contrast. RADIATION DOSE REDUCTION: This exam was performed according to the departmental dose-optimization program which includes automated exposure control, adjustment of the mA and/or kV according to patient size and/or use of iterative reconstruction technique. CONTRAST:  OMNIPAQUE IOHEXOL 300 MG/ML  SOLN COMPARISON:  Ultrasound 09/12/2021 FINDINGS: Lower chest: Lung bases demonstrate no acute airspace disease. Calcified granuloma at the right base. Cardiomegaly. Hepatobiliary:  Liver cirrhosis. No calcified gallstone or biliary dilatation. Pancreas: No ductal dilatation.  No definitive inflammation Spleen: Enlarged with AP diameter of 18 cm. Adrenals/Urinary Tract: Adrenal glands are within normal limits. Kidneys show no hydronephrosis. The bladder is unremarkable Stomach/Bowel: Stomach nonenlarged. No dilated small bowel. No acute bowel wall thickening. Vascular/Lymphatic: Nonaneurysmal aorta. Recanalized paraumbilical vein. Small gastroesophageal varices. Multiple subcentimeter retroperitoneal lymph nodes. Reproductive: Prostate is unremarkable. Other: No free air. Trace volume free fluid in the left upper quadrant. Left greater than right subcutaneous flank edema. Mild fluid and thickening in the anterior pararenal spaces. Generalized hazy  appearance of the mesentery. Musculoskeletal: No acute or suspicious osseous abnormality IMPRESSION: 1. Liver cirrhosis with evidence of portal hypertension including splenomegaly, small gastroesophageal varices, and trace volume ascites. 2. Generalized hazy appearance of the mesentery which could be due to nonspecific mesenteric edema. There is some fluid and haziness in the pararenal spaces, could correlate with pancreatic enzymes to exclude pancreatitis though imaging findings are not strongly suggestive of this diagnosis. 3. Cardiomegaly. Electronically Signed   By: Jasmine Pang M.D.   On: 02/20/2023 23:47    Procedures Procedures    Medications Ordered in ED Medications  furosemide (LASIX) injection 40 mg (40 mg Intravenous Given 02/20/23 2313)  iohexol (OMNIPAQUE) 300 MG/ML solution 100 mL (100 mLs Intravenous Contrast Given 02/20/23 2258)    ED Course/ Medical Decision Making/ A&P Clinical Course as of 02/20/23 2354  Fri Feb 20, 2023  2350 Lipase: 50 [JS]    Clinical Course User Index [JS] Claude Manges, PA-C                                 Medical Decision Making Amount and/or Complexity of Data Reviewed Labs: ordered.  Decision-making details documented in ED Course. Radiology: ordered.  Risk Prescription drug management.   This patient presents to the ED for concern of abdominal pain, this involves a number of treatment options, and is a complaint that carries with it a high risk of complications and morbidity.  The differential diagnosis includes diverticulitis, obstruction versus infection.    Co morbidities: Discussed in HPI   Brief History:  See HPI.   EMR reviewed including pt PMHx, past surgical history and past visits to ER.   See HPI for more details   Lab Tests:  I ordered and independently interpreted labs.  The pertinent results include:    CBC with some leukopenia noted, hemoglobin is stable from prior.  CMP without any electrolyte derangement, bilirubin decreased.  LFTs slight elevation of AST, does have a prior history of alcohol use and esophageal varices report he has not been drinking anymore.  Lipase level is normal.  UA with no nitrites, leukocytes present.   Imaging Studies:  CT abdomen and pelvis: IMPRESSION:  1. Liver cirrhosis with evidence of portal hypertension including  splenomegaly, small gastroesophageal varices, and trace volume  ascites.  2. Generalized hazy appearance of the mesentery which could be due  to nonspecific mesenteric edema. There is some fluid and haziness in  the pararenal spaces, could correlate with pancreatic enzymes to  exclude pancreatitis though imaging findings are not strongly  suggestive of this diagnosis.    Medicines ordered:  I ordered medication including Lasix for peripheral edema Reevaluation of the patient after these medicines showed that the patient stayed the same I have reviewed the patients home medicines and have made adjustments as needed  Reevaluation:  After the interventions noted above I re-evaluated patient and found that they have :improved   Social Determinants of Health:  The patient's social  determinants of health were a factor in the care of this patient  Problem List / ED Course:  Patient here s/p EGD x 1 week here with abdominal distention and pain to the LLQ, no nausea no vomiting or fevers.  Patient does have redness to the left lower quadrant to the superficial skin concerning for some cellulitis.  Patient is not having any systemic signs.  His blood work today shows some leukopenia on his CBC,  hemoglobin is stable.  CMP with no electrolyte derangement, creatinine level is slightly decreased.  AST is slightly elevated however he reports he no longer drinks after his episode of esophageal varices. UA with no nitrites or leukocytes present. There is 2+ pitting edema to his left lower leg, therefore given some IV Lasix to help with diuresis.  He is not having any shortness of breath, the rest of his vitals are within normal limits, he is afebrile.  We discussed obtaining CT abdomen to further evaluate his pain, suspect that this is normal will likely be stable for discharge. CT abdomen and pelvis showed some mesentery edema, however he is not having any vomiting, any nausea.  He does have some diarrhea but there is no blood present to warrant antibiotic use at this time.  There is some concern for pancreatitis, however he is not focally tender to his left upper quadrant, his lipase level is normal and does not have a prior history of pancreatitis.  I do feel that patient is overall hemodynamically stable for follow-up with GI at his scheduled appointment on Monday morning.  Dispostion: Patient is hemodynamically stable for discharge, return precautions discussed at length.   Portions of this note were generated with Scientist, clinical (histocompatibility and immunogenetics). Dictation errors may occur despite best attempts at proofreading.   Final Clinical Impression(s) / ED Diagnoses Final diagnoses:  Left lower quadrant abdominal pain    Rx / DC Orders ED Discharge Orders     None         Claude Manges,  PA-C 02/20/23 2354    Charlynne Pander, MD 02/21/23 1447

## 2023-02-20 NOTE — Discharge Instructions (Signed)
We discussed the results of your CT scan on today's visit.  If you experience any fever, worsening symptoms you may return to the emergency department.  Please continue to follow-up with Dr. Gabriel Carina at your scheduled appointment on Monday.

## 2023-02-20 NOTE — Telephone Encounter (Signed)
(  I think he has become your patient) Spoke with the patient. He has been taking the Creon with his meals and snacks as instructed. He states his diarrhea has continued without improvement. He has tenesmus and frequency. He is becoming very fatigued. He does not feel he has fever, but he has chills and feels cold. He wants to go to the ER if you do not have any suggestions. I found an appointment for him on Monday 02/23/23 with Hyacinth Meeker, PA. He is waiting for your recommendations.

## 2023-02-23 ENCOUNTER — Ambulatory Visit (INDEPENDENT_AMBULATORY_CARE_PROVIDER_SITE_OTHER): Payer: BC Managed Care – PPO | Admitting: Physician Assistant

## 2023-02-23 ENCOUNTER — Encounter: Payer: Self-pay | Admitting: Physician Assistant

## 2023-02-23 VITALS — BP 98/60 | HR 85

## 2023-02-23 DIAGNOSIS — R109 Unspecified abdominal pain: Secondary | ICD-10-CM | POA: Diagnosis not present

## 2023-02-23 DIAGNOSIS — K7030 Alcoholic cirrhosis of liver without ascites: Secondary | ICD-10-CM

## 2023-02-23 DIAGNOSIS — K219 Gastro-esophageal reflux disease without esophagitis: Secondary | ICD-10-CM | POA: Diagnosis not present

## 2023-02-23 DIAGNOSIS — S82841D Displaced bimalleolar fracture of right lower leg, subsequent encounter for closed fracture with routine healing: Secondary | ICD-10-CM | POA: Diagnosis not present

## 2023-02-23 NOTE — Telephone Encounter (Signed)
Reviewed recent CT abdomen pelvis on 9/13, has findings of mild pancreatitis.  Please advise patient to maintain hydration with increase water and intake.  Alcohol cessation.  Continue Creon but if his symptoms or abdominal pain is worsening, he will need to come to ER

## 2023-02-23 NOTE — Progress Notes (Signed)
Chief Complaint: Diarrhea and abdominal pain  HPI:    Thomas [name] is a 47 year old male with a past medical history as listed below including alcoholic cirrhosis, IDA and multiple others, known to Dr. Lavon Paganini, who presents to clinic today with a complaint of diarrhea and abdominal pain.      02/12/2023 EGD with small less than 5 mm esophageal varices with no bleeding and no stigmata of recent bleeding, portal hypertensive gastropathy and erythematous mucosa in the prepyloric region of the stomach.  Patient given Nadolol 20 mg daily and told to titrate to a goal of heart rate 50-60.  Also Creon 72,000 units with meals 3 times daily for pancreatic insufficiency and chronic diarrhea.    02/19/2023 patient called and described the medication given to stop his diarrhea was not helping and needed swelling on the left side of his abdomen.  At that time discussed he was taking Creon with meals and snacks but diarrhea was no better.  Describes chills.  At that time CT reviewed from 9/13 patient had mild pancreatitis.      02/20/2023 patient seen in the ED and at that time described abdominal distention and pain.  He explains it has been going on since his EGD last Thursday as above.  Found to have 2+ pitting edema on the left.  (Cast on the right foot).  AST 71, alk phos 158, total bili 2.3.  Hemoglobin 8.9.  Platelets 109.  Lipase normal.  At that time findings of what may be cellulitis in left lower quadrant.  He was given some IV Lasix to help with diuresis.    02/20/2023 CT of the abdomen pelvis with cirrhosis and evidence of portal hypertension including splenomegaly, gastroesophageal varices and trace volume ascites as well as generalized hazy appearance of the mesentery.      Today, patient presents to clinic and tells me that he has been having issues with swelling and pain on the left side of his abdomen which feels warm to the touch, slightly worse when he gets up in the mornings and sometimes he lays on the  side.  This is maybe slightly better than when he was seen in the ER as above in regards to the swelling.  No fever or chills.  Continues on Lasix 40 mg daily.  Never was prescribed antibiotics even though there is discussion about cellulitis.  Continues on Nadolol 20 mg daily but he discontinued the Pantoprazole which he was taking 40 mg twice daily.    Also continues with diarrhea, currently had been on Creon 2 with a meal and 1 with a snack but was still having up to 8 or more loose stools a day.  This has been occurring over the past 2 months.  There is no change with Imodium when he takes it recently, though he has not tried scheduling this out.  Later tells me that he actually stopped the Creon 2 days ago and he has not had a bowel movement at all.  Previous stool studies including stool culture and C. difficile negative.    Denies fever, chills, weight loss or blood in his stool.   Past Medical History:  Diagnosis Date   Acute conjunctivitis of both eyes 10/22/2021   Acute sinusitis 06/16/2013   Alcoholic cirrhosis (HCC)    Alcoholic fatty liver 01/16/2010   Needs final HBV and HAV vaccines on or after 10/25/2012    Alcoholism (HCC) 12/25/2011   Allergic rhinitis    Childhood asthma  Elevated transaminase level 06/10/2007   AST: 80 ALT: 136 in 8/11: Hepatitis A., B and C negative.    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    Sleep apnea     Past Surgical History:  Procedure Laterality Date   ESOPHAGEAL BANDING  12/26/2022   Procedure: ESOPHAGEAL BANDING;  Surgeon: Napoleon Form, MD;  Location: [facility];  Service: Gastroenterology;;   ESOPHAGOGASTRODUODENOSCOPY (EGD) WITH PROPOFOL N/A 07/05/2022   Procedure: ESOPHAGOGASTRODUODENOSCOPY (EGD) WITH PROPOFOL;  Surgeon: Napoleon Form, MD;  Location: [facility];  Service: Gastroenterology;  Laterality: N/A;   ESOPHAGOGASTRODUODENOSCOPY (EGD) WITH PROPOFOL N/A 12/26/2022   Procedure: ESOPHAGOGASTRODUODENOSCOPY (EGD) WITH PROPOFOL;  Surgeon: Napoleon Form, MD;  Location: [facility];  Service: Gastroenterology;  Laterality: N/A;   ESOPHAGOGASTRODUODENOSCOPY (EGD) WITH PROPOFOL N/A 02/12/2023   Procedure: ESOPHAGOGASTRODUODENOSCOPY (EGD) WITH PROPOFOL;  Surgeon: Napoleon Form, MD;  Location: [facility];  Service: Gastroenterology;  Laterality: N/A;   ORIF ANKLE FRACTURE Right 12/28/2022   Procedure: OPEN REDUCTION INTERNAL FIXATION (ORIF) ANKLE FRACTURE;  Surgeon: Teryl Lucy, MD;  Location: [facility] OR;  Service: Orthopedics;  Laterality: Right;    Current Outpatient Medications  Medication Sig Dispense Refill   acetaminophen (TYLENOL) 325 MG tablet Take 1-2 tablets (325-650 mg total) by mouth every 4 (four) hours as needed for mild pain.     cyclobenzaprine (FLEXERIL) 5 MG tablet Take 1 tablet (5 mg total) by mouth 3 (three) times daily as needed for muscle spasms. 90 tablet 0   diclofenac Sodium (VOLTAREN) 1 % GEL Apply 2 g topically 4 (four) times daily. 350 g 0   ferrous sulfate 325 (65 FE) MG tablet Take 1 tablet (325 mg total) by mouth daily with breakfast. 30 tablet 3   furosemide (LASIX) 40 MG tablet Take 1 tablet (40 mg total) by mouth daily. 30 tablet 0   lipase/protease/amylase (CREON) 36000 UNITS CPEP capsule Take 2 capsules (72,000 Units total) by mouth 3 (three) times daily with meals. May also take 1 capsule (36,000 Units total) as needed (with snacks). 240 capsule 11   nadolol (CORGARD) 20 MG tablet Take 1 tablet (20 mg total) by mouth daily. 30 tablet 11   pantoprazole (PROTONIX) 40 MG tablet Take 1 tablet (40 mg total) by mouth daily. 90 tablet 3   potassium chloride SA (KLOR-CON M) 20 MEQ tablet Take 1 tablet (20 mEq total) by mouth daily. 30 tablet 0   No current facility-administered medications  for this visit.    Allergies as of 02/23/2023 - Review Complete 02/20/2023  Allergen Reaction Noted   Codeine Other (See Comments) 02/13/2015    Family History  Problem Relation Age of Onset   Liver disease Mother    Depression Mother    Liver disease Father    Diabetes Father    Hypertension Father    Liver disease Sister    Diabetes Other    Colon cancer Neg Hx    Esophageal cancer Neg Hx    Stomach cancer Neg Hx    Colon polyps Neg Hx     Social History   Socioeconomic History   Marital status: Single    Spouse name: Not on file   Number of children: Not on file   Years of education: Not on file   Highest  education level: Not on file  Occupational History   Not on file  Tobacco Use   Smoking status: Some Days    Types: Cigars   Smokeless tobacco: Never   Tobacco comments:    Cigars occasional  Vaping Use   Vaping status: Never Used  Substance and Sexual Activity   Alcohol use: Not Currently    Comment: Beer/wine 1 - 3 times a week (drinks heavily on weekends)   Drug use: Never    Comment: history of cocaine abuse   Sexual activity: Yes  Other Topics Concern   Not on file  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             Social Determinants of Health   Financial Resource Strain: Not on file  Food Insecurity: No Food Insecurity (12/29/2022)   Hunger Vital Sign    Worried About Running Out of Food in the Last Year: Never true    Ran Out of Food in the Last Year: Never true  Transportation Needs: No Transportation Needs (12/29/2022)   PRAPARE - Administrator, Civil Service (Medical): No    Lack of Transportation (Non-Medical): No  Physical Activity: Not on file  Stress: Not on file  Social Connections: Not on file  Intimate Partner Violence: Not At Risk (12/29/2022)   Humiliation, Afraid, Rape, and Kick questionnaire    Fear of Current or Ex-Partner: No    Emotionally Abused: No     Physically Abused: No    Sexually Abused: No    Review of Systems:    Constitutional: No weight loss, fever or chills Skin: +slight erythema and leather like feeling to skin on left side of abdomen Cardiovascular: No chest pain Respiratory: No SOB  Gastrointestinal: See HPI and otherwise negative   Physical Exam:  Vital signs: BP 98/60 (BP Location: Left Arm, Patient Position: Sitting, Cuff Size: Large)   Pulse 85    Constitutional:   Pleasant obese Hispanic male appears to be in NAD, Well developed, Well nourished, alert and cooperative Respiratory: Respirations even and unlabored. Lungs clear to auscultation bilaterally.   No wheezes, crackles, or rhonchi.  Cardiovascular: Normal S1, S2. No MRG. Regular rate and rhythm. No peripheral edema, cyanosis or pallor.  Gastrointestinal:  Soft, nondistended, nontender. No rebound or guarding. Normal bowel sounds. No appreciable masses or hepatomegaly. Rectal:  Not performed.  Msk:  Symmetrical without gross deformities. Without edema, no deformity or joint abnormality. +in whleechair Skin:   + Very slight erythema to the left side of patient's abdomen under pannus and radiating upwards to his left side as well as leather like texture, patient reports tenderness only light palpation Psychiatric: Oriented to person, place and time. Demonstrates good judgement and reason without abnormal affect or behaviors.  RELEVANT LABS AND IMAGING: CBC    Component Value Date/Time   WBC 3.8 (L) 02/20/2023 1519   RBC 3.75 (L) 02/20/2023 1519   HGB 8.9 (L) 02/20/2023 1519   HCT 30.3 (L) 02/20/2023 1519   PLT 109 (L) 02/20/2023 1519   MCV 80.8 02/20/2023 1519   MCH 23.7 (L) 02/20/2023 1519   MCHC 29.4 (L) 02/20/2023 1519   RDW 17.2 (H) 02/20/2023 1519   LYMPHSABS 0.7 02/03/2023 1543   MONOABS 0.4 02/03/2023 1543   EOSABS 0.6 02/03/2023 1543   BASOSABS 0.0 02/03/2023 1543    CMP     Component Value Date/Time   NA 138 02/20/2023 1519  K 4.0  02/20/2023 1519   CL 106 02/20/2023 1519   CO2 27 02/20/2023 1519   GLUCOSE 92 02/20/2023 1519   BUN 9 02/20/2023 1519   CREATININE 0.55 (L) 02/20/2023 1519   CREATININE 0.66 06/14/2021 1419   CALCIUM 8.4 (L) 02/20/2023 1519   PROT 6.3 (L) 02/20/2023 1519   ALBUMIN 2.7 (L) 02/20/2023 1519   AST 71 (H) 02/20/2023 1519   ALT 41 02/20/2023 1519   ALKPHOS 158 (H) 02/20/2023 1519   BILITOT 2.3 (H) 02/20/2023 1519   GFRNONAA >60 02/20/2023 1519   GFRNONAA >89 06/22/2013 0958   GFRAA >89 06/22/2013 0958    Assessment: 1.  Decompensated alcoholic cirrhosis: Up-to-date with variceal screening, trace ascites on recent CT scan, currently on Lasix 40 mg daily and Nadolol 20 mg daily for varices 2.  Left-sided abdominal discomfort: Maybe slight erythema, warmth and Levonest texture to the left side of patient's abdomen on his skin, recent CT with minimal increase in fluid on the side; consider cellulitis versus other 3.  Diarrhea: Previous stool testing including C. difficile and culture negative, thought related to pancreatic insufficiency, but Creon was not helping 2 with a meal and 1 with a snack, he stopped Creon completely 2 days ago and actually has not had any diarrhea since; consider relation to Creon versus other  Plan: 1.  Will discuss treatment for possible cellulitis with Dr. Lavon Paganini. 2.  Restarted the patient on his Pantoprazole 40 mg twice daily, prescribed #60 with 5 refills.  Not sure why he stopped this. 3.  Continue Nadolol 20 mg daily 4.  Patient tells me since stopping Creon 2 days ago he has not had diarrhea again.  We will trial a hold of this medication to see if it helps.  If not may need to consider repeat colonoscopy for ongoing diarrhea where scheduled Imodium 5.  Continue Lasix 40 mg daily 6.  Patient to follow in clinic with Dr. Lavon Paganini at her next available appointment.  Thomas Meeker, PA-C [facility] 02/23/2023, 11:11 AM  Cc: [name]

## 2023-02-23 NOTE — Patient Instructions (Addendum)
Continue Pantoprazole 40 mg twice daily  HOLD CREON  Continue NADOLOL 20 mg daily  _______________________________________________________  If your blood pressure at your visit was 140/90 or greater, please contact your primary care physician to follow up on this.  _______________________________________________________  If you are age 45 or older, your body mass index should be between 23-30. Your There is no height or weight on file to calculate BMI. If this is out of the aforementioned range listed, please consider follow up with your Primary Care Provider.  If you are age 30 or younger, your body mass index should be between 19-25. Your There is no height or weight on file to calculate BMI. If this is out of the aformentioned range listed, please consider follow up with your Primary Care Provider.   ________________________________________________________  The  GI providers would like to encourage you to use [facility] to communicate with providers for non-urgent requests or questions.  Due to long hold times on the telephone, sending your provider a message by [facility] may be a faster and more efficient way to get a response.  Please allow 48 business hours for a response.  Please remember that this is for non-urgent requests.  _______________________________________________________   I appreciate the  opportunity to care for you  Thank You   Jacelyn Grip

## 2023-02-25 NOTE — Telephone Encounter (Signed)
Referral, records, pt's demogrpahic, and insurance information faxed to [facility] at [phone].

## 2023-02-25 NOTE — Telephone Encounter (Signed)
Ok to refer to [facility] in [place]?

## 2023-03-03 ENCOUNTER — Telehealth: Payer: Self-pay

## 2023-03-03 DIAGNOSIS — M84371D Stress fracture, right ankle, subsequent encounter for fracture with routine healing: Secondary | ICD-10-CM | POA: Diagnosis not present

## 2023-03-03 NOTE — Telephone Encounter (Signed)
Recommendations sent to patient - see 9/23 patient message.

## 2023-03-03 NOTE — Telephone Encounter (Signed)
Patient has appointment scheduled for 06/08/23 @ 2:15 pm with [facility].

## 2023-03-03 NOTE — Telephone Encounter (Signed)
-----   Message from Unk Lightning sent at 03/03/2023  3:17 PM EDT ----- Regarding: FW: Please see my note from today If patient has not already can you make sure he is seeing PCP in regards to possible cellulitis.  Thanks, JL L ----- Message ----- From: Napoleon Form, MD Sent: 03/03/2023   3:10 PM EDT To: Unk Lightning, PA Subject: RE: Please see my note from today              Agree with sending him to PCP for evaluation to exclude cellulitis, he is complex and I feel he has ongoing EtOH use with possible chronic pancreatitis/insufficiency though lipase was normal ----- Message ----- From: Unk Lightning, PA Sent: 02/23/2023   3:43 PM EDT To: Napoleon Form, MD Subject: Please see my note from today                  This patient is somewhat complicated, you can see his recent ER visit, I know you had discussed possible pancreatitis but his lipase was normal and he had no upper abdominal pain even though imaging showed possibility, his pain is more superficial on the left side of his abdomen, I could not appreciate any true erythema but there was some warmth on this side as well as leather like feel of his skin, do I just need to refer him back to his PCP for possible cellulitis or can we offer him treatment here?  Also he told me that someone stopped his Pantoprazole, not sure why this would have been done, I continued him on 40 mg twice a day but wanted to get your opinion.  Thanks so much for your review, Hyacinth Meeker, PA-C

## 2023-03-10 ENCOUNTER — Ambulatory Visit (INDEPENDENT_AMBULATORY_CARE_PROVIDER_SITE_OTHER): Payer: BC Managed Care – PPO | Admitting: Family Medicine

## 2023-03-10 ENCOUNTER — Encounter: Payer: Self-pay | Admitting: Family Medicine

## 2023-03-10 ENCOUNTER — Other Ambulatory Visit: Payer: Self-pay | Admitting: Physical Medicine and Rehabilitation

## 2023-03-10 VITALS — BP 114/72 | HR 69 | Temp 98.5°F | Ht 65.0 in

## 2023-03-10 DIAGNOSIS — L4050 Arthropathic psoriasis, unspecified: Secondary | ICD-10-CM

## 2023-03-10 DIAGNOSIS — Z23 Encounter for immunization: Secondary | ICD-10-CM | POA: Diagnosis not present

## 2023-03-10 DIAGNOSIS — L03311 Cellulitis of abdominal wall: Secondary | ICD-10-CM | POA: Diagnosis not present

## 2023-03-10 DIAGNOSIS — F1090 Alcohol use, unspecified, uncomplicated: Secondary | ICD-10-CM

## 2023-03-10 DIAGNOSIS — M84371D Stress fracture, right ankle, subsequent encounter for fracture with routine healing: Secondary | ICD-10-CM | POA: Diagnosis not present

## 2023-03-10 MED ORDER — CEPHALEXIN 500 MG PO CAPS
500.0000 mg | ORAL_CAPSULE | Freq: Three times a day (TID) | ORAL | 0 refills | Status: AC
Start: 2023-03-10 — End: 2023-03-20

## 2023-03-10 NOTE — Progress Notes (Signed)
Established Patient Office Visit   Subjective:  Patient ID: Thomas [name], male    DOB: Jul 17, 1975  Age: 47 y.o. MRN: [id number]  Chief Complaint  Patient presents with   Bloated    Left quadrant edema. Pt had a endoscopy recently but have had swelling in his stomach ever since. Pt states he still has diarrhea but it has become more solid.     HPI Encounter Diagnoses  Name Primary?   Cellulitis of abdominal wall Yes   Need for immunization against influenza    Psoriatic arthritis (HCC)    Alcohol use disorder    For evaluation of swelling in his left abdominal wall.  He continues with Lasix for lower extremity edema and ascites.  He has cirrhosis of the liver.  Recent potassium was 4.0.  He has run no fevers.  Arthritis pains consistent with his history of psoriatic arthritis that flared since his DMARD was discontinued secondary to hepatic cirrhosis.  Sober now for 4 months.   ROS   Current Outpatient Medications:    acetaminophen (TYLENOL) 325 MG tablet, Take 1-2 tablets (325-650 mg total) by mouth every 4 (four) hours as needed for mild pain., Disp: , Rfl:    cephALEXin (KEFLEX) 500 MG capsule, Take 1 capsule (500 mg total) by mouth 3 (three) times daily for 10 days., Disp: 30 capsule, Rfl: 0   cyclobenzaprine (FLEXERIL) 5 MG tablet, Take 1 tablet (5 mg total) by mouth 3 (three) times daily as needed for muscle spasms., Disp: 90 tablet, Rfl: 0   diclofenac Sodium (VOLTAREN) 1 % GEL, Apply 2 g topically 4 (four) times daily., Disp: 350 g, Rfl: 0   ferrous sulfate 325 (65 FE) MG tablet, Take 1 tablet (325 mg total) by mouth daily with breakfast., Disp: 30 tablet, Rfl: 3   furosemide (LASIX) 40 MG tablet, Take 1 tablet (40 mg total) by mouth daily., Disp: 30 tablet, Rfl: 0   nadolol (CORGARD) 20 MG tablet, Take 1 tablet (20 mg total) by mouth daily., Disp: 30 tablet, Rfl: 11   pantoprazole (PROTONIX) 40 MG tablet, Take 1 tablet (40 mg total) by mouth daily., Disp: 90 tablet, Rfl:  3   lipase/protease/amylase (CREON) 36000 UNITS CPEP capsule, Take 2 capsules (72,000 Units total) by mouth 3 (three) times daily with meals. May also take 1 capsule (36,000 Units total) as needed (with snacks). (Patient not taking: Reported on 03/10/2023), Disp: 240 capsule, Rfl: 11   potassium chloride SA (KLOR-CON M) 20 MEQ tablet, Take 1 tablet (20 mEq total) by mouth daily. (Patient not taking: Reported on 03/10/2023), Disp: 30 tablet, Rfl: 0   Objective:     BP 114/72   Pulse 69   Temp 98.5 F (36.9 C)   Ht 5\' 5"  (1.651 m)   SpO2 98%   BMI 44.10 kg/m    Physical Exam   No results found for any visits on 03/10/23.    The 10-year ASCVD risk score (Arnett DK, et al., 2019) is: 2.7%    Assessment & Plan:   Cellulitis of abdominal wall -     Cephalexin; Take 1 capsule (500 mg total) by mouth 3 (three) times daily for 10 days.  Dispense: 30 capsule; Refill: 0  Need for immunization against influenza -     Flu vaccine trivalent PF, 6mos and older(Flulaval,Afluria,Fluarix,Fluzone)  Psoriatic arthritis (HCC) -     Ambulatory referral to Rheumatology  Alcohol use disorder    Return in about 4 weeks (around 04/07/2023),  or if symptoms worsen or fail to improve.  Consider online AA meetings.   [name], MD

## 2023-03-11 ENCOUNTER — Other Ambulatory Visit: Payer: Self-pay | Admitting: Physical Medicine and Rehabilitation

## 2023-03-11 MED ORDER — NADOLOL 20 MG PO TABS: 20.0000 mg | ORAL_TABLET | Freq: Every day | ORAL | 11 refills | Status: DC

## 2023-03-11 MED ORDER — FUROSEMIDE 40 MG PO TABS: 40.0000 mg | ORAL_TABLET | Freq: Every day | ORAL | 5 refills | Status: DC

## 2023-03-12 DIAGNOSIS — M84371D Stress fracture, right ankle, subsequent encounter for fracture with routine healing: Secondary | ICD-10-CM | POA: Diagnosis not present

## 2023-03-17 DIAGNOSIS — M84371D Stress fracture, right ankle, subsequent encounter for fracture with routine healing: Secondary | ICD-10-CM | POA: Diagnosis not present

## 2023-03-17 DIAGNOSIS — I8511 Secondary esophageal varices with bleeding: Secondary | ICD-10-CM | POA: Diagnosis not present

## 2023-03-17 DIAGNOSIS — K922 Gastrointestinal hemorrhage, unspecified: Secondary | ICD-10-CM | POA: Diagnosis not present

## 2023-03-17 DIAGNOSIS — J45909 Unspecified asthma, uncomplicated: Secondary | ICD-10-CM | POA: Diagnosis not present

## 2023-03-17 DIAGNOSIS — K7460 Unspecified cirrhosis of liver: Secondary | ICD-10-CM | POA: Diagnosis not present

## 2023-03-19 DIAGNOSIS — M84371D Stress fracture, right ankle, subsequent encounter for fracture with routine healing: Secondary | ICD-10-CM | POA: Diagnosis not present

## 2023-03-23 DIAGNOSIS — S82841D Displaced bimalleolar fracture of right lower leg, subsequent encounter for closed fracture with routine healing: Secondary | ICD-10-CM | POA: Diagnosis not present

## 2023-03-26 DIAGNOSIS — M84371D Stress fracture, right ankle, subsequent encounter for fracture with routine healing: Secondary | ICD-10-CM | POA: Diagnosis not present

## 2023-03-31 DIAGNOSIS — M84371D Stress fracture, right ankle, subsequent encounter for fracture with routine healing: Secondary | ICD-10-CM | POA: Diagnosis not present

## 2023-04-07 DIAGNOSIS — M84371D Stress fracture, right ankle, subsequent encounter for fracture with routine healing: Secondary | ICD-10-CM | POA: Diagnosis not present

## 2023-04-12 ENCOUNTER — Other Ambulatory Visit: Payer: Self-pay | Admitting: Family Medicine

## 2023-04-27 DIAGNOSIS — S82841D Displaced bimalleolar fracture of right lower leg, subsequent encounter for closed fracture with routine healing: Secondary | ICD-10-CM | POA: Diagnosis not present

## 2023-05-05 ENCOUNTER — Encounter: Payer: Self-pay | Admitting: Family Medicine

## 2023-05-06 ENCOUNTER — Encounter: Payer: Self-pay | Admitting: Family Medicine

## 2023-05-06 ENCOUNTER — Ambulatory Visit (INDEPENDENT_AMBULATORY_CARE_PROVIDER_SITE_OTHER): Payer: BC Managed Care – PPO | Admitting: Family Medicine

## 2023-05-06 VITALS — BP 130/78 | HR 61 | Temp 97.4°F | Wt 274.4 lb

## 2023-05-06 DIAGNOSIS — J4520 Mild intermittent asthma, uncomplicated: Secondary | ICD-10-CM | POA: Diagnosis not present

## 2023-05-06 MED ORDER — ALBUTEROL SULFATE HFA 108 (90 BASE) MCG/ACT IN AERS
2.0000 | INHALATION_SPRAY | Freq: Four times a day (QID) | RESPIRATORY_TRACT | 0 refills | Status: DC | PRN
Start: 2023-05-06 — End: 2024-02-21

## 2023-05-06 MED ORDER — PREDNISONE 50 MG PO TABS
ORAL_TABLET | ORAL | 0 refills | Status: DC
Start: 2023-05-06 — End: 2023-05-14

## 2023-05-06 NOTE — Progress Notes (Signed)
Assessment/Plan:   Problem List Items Addressed This Visit       Respiratory   Reactive airway disease - Primary    Viral-triggered reactive airway disease exacerbation. Plan:  Prednisone 50 mg orally once daily for 5 days. Instructed to take with food to minimize gastrointestinal discomfort. Albuterol inhaler: 1-2 puffs every 4-6 hours as needed for wheezing and cough. Advised to monitor for any worsening symptoms, including chest pain, increased shortness of breath, fever, or chills. If symptoms do not improve or worsen, instructed to return for further evaluation. Advised to go to ED if symptoms are severe. Discussed the importance of adhering to the medication regimen. Educated about potential side effects of steroids and proper use of the albuterol inhaler.      Relevant Medications   predniSONE (DELTASONE) 50 MG tablet   albuterol (VENTOLIN HFA) 108 (90 Base) MCG/ACT inhaler    There are no discontinued medications.  No follow-ups on file.    Subjective:   Encounter date: 05/06/2023  Thomas [name] is a 47 y.o. male who has Scrotal varices, left; Allergic rhinitis; Alcoholic fatty liver; Alcohol use disorder; Insomnia; OSA (obstructive sleep apnea); Healthcare maintenance; Psoriasis; Esophageal varices in alcoholic cirrhosis (HCC); Class 3 severe obesity due to excess calories with body mass index (BMI) of 50.0 to 59.9 in adult [facility]); Elevated BP without diagnosis of hypertension; Need for immunization against influenza; Vitamin D deficiency; Screening for tuberculosis; Acute blood loss anemia; Psoriatic arthritis (HCC); Viral upper respiratory tract infection; Pharyngitis; Acute upper GI bleed; Hematemesis with nausea; Melena; Reactive airway disease; Cirrhosis of liver (HCC); Secondary esophageal varices with bleeding (HCC); Closed right ankle fracture; Thrombocytopenia (HCC); Ankle fracture; Localized edema; History of ETOH abuse; Esophageal varices without bleeding  (HCC); and Cellulitis of abdominal wall on their problem list..   He  has a past medical history of Acute conjunctivitis of both eyes (10/22/2021), Acute sinusitis (06/16/2013), Alcoholic cirrhosis (HCC), Alcoholic fatty liver (01/16/2010), Alcoholism (HCC) (12/25/2011), Allergic rhinitis, Childhood asthma, Elevated transaminase level (06/10/2007), Gastroenteritis (08/26/2021), Hand pain, left (07/28/2022), Hordeolum externum of right upper eyelid (08/14/2022), IDA (iron deficiency anemia), Morbid obesity (HCC), Scrotal varices (01/07/2010), and Sleep apnea..   Chief Complaint: Wheezing cough and chest congestion for 10 days.  History of Present Illness: The patient is a 47 year old male presenting with a 10-day history of respiratory symptoms. Initially experienced sinus congestion and nasal symptoms, which he encounters seasonally and typically resolve on their own. Last week, he had mild fevers--felt feverish but did not measure his temperature. Symptoms have progressed to chest involvement with a wheezing cough productive of phlegm. Over-the-counter medications, including DayQuil, have not provided relief.  He has a history of reactive airway disease triggered by viral infections but has not used an albuterol inhaler in years. Denies current fever, chills, chest pain, shortness of breath, ear pain, or abdominal pain. No longer experiencing sinus pain or ear pain.  He reports that he does not smoke cigarettes or vape.  Review of Systems:  Constitutional: Reports fatigue; no current fever or chills. HEENT: No current sinus pain or ear pain. Respiratory: Wheezing cough, productive of phlegm. No SOB.  Cardiovascular: Denies chest pain. Gastrointestinal: Denies abdominal pain.     Past Surgical History:  Procedure Laterality Date   ESOPHAGEAL BANDING  12/26/2022   Procedure: ESOPHAGEAL BANDING;  Surgeon: Napoleon Form, MD;  Location: [facility];  Service: Gastroenterology;;    ESOPHAGOGASTRODUODENOSCOPY (EGD) WITH PROPOFOL N/A 07/05/2022   Procedure: ESOPHAGOGASTRODUODENOSCOPY (EGD) WITH PROPOFOL;  Surgeon:  Napoleon Form, MD;  Location: [facility];  Service: Gastroenterology;  Laterality: N/A;   ESOPHAGOGASTRODUODENOSCOPY (EGD) WITH PROPOFOL N/A 12/26/2022   Procedure: ESOPHAGOGASTRODUODENOSCOPY (EGD) WITH PROPOFOL;  Surgeon: Napoleon Form, MD;  Location: [facility];  Service: Gastroenterology;  Laterality: N/A;   ESOPHAGOGASTRODUODENOSCOPY (EGD) WITH PROPOFOL N/A 02/12/2023   Procedure: ESOPHAGOGASTRODUODENOSCOPY (EGD) WITH PROPOFOL;  Surgeon: Napoleon Form, MD;  Location: [facility];  Service: Gastroenterology;  Laterality: N/A;   ORIF ANKLE FRACTURE Right 12/28/2022   Procedure: OPEN REDUCTION INTERNAL FIXATION (ORIF) ANKLE FRACTURE;  Surgeon: Teryl Lucy, MD;  Location: [facility];  Service: Orthopedics;  Laterality: Right;    Outpatient Medications Prior to Visit  Medication Sig Dispense Refill   furosemide (LASIX) 40 MG tablet Take 1 tablet (40 mg total) by mouth daily. 30 tablet 5   nadolol (CORGARD) 20 MG tablet Take 1 tablet (20 mg total) by mouth daily. 30 tablet 11   pantoprazole (PROTONIX) 40 MG tablet Take 1 tablet (40 mg total) by mouth daily. 90 tablet 3   acetaminophen (TYLENOL) 325 MG tablet Take 1-2 tablets (325-650 mg total) by mouth every 4 (four) hours as needed for mild pain. (Patient not taking: Reported on 05/06/2023)     cyclobenzaprine (FLEXERIL) 5 MG tablet Take 1 tablet (5 mg total) by mouth 3 (three) times daily as needed for muscle spasms. (Patient not taking: Reported on 05/06/2023) 90 tablet 0   diclofenac Sodium (VOLTAREN) 1 % GEL Apply 2 g topically 4 (four) times daily. (Patient not taking: Reported on 05/06/2023) 350 g 0   ferrous sulfate 325 (65 FE) MG tablet Take 1 tablet (325 mg total) by mouth daily with breakfast. (Patient not taking: Reported on 05/06/2023) 30 tablet 3   lipase/protease/amylase (CREON) 36000  UNITS CPEP capsule Take 2 capsules (72,000 Units total) by mouth 3 (three) times daily with meals. May also take 1 capsule (36,000 Units total) as needed (with snacks). (Patient not taking: Reported on 03/10/2023) 240 capsule 11   potassium chloride SA (KLOR-CON M) 20 MEQ tablet Take 1 tablet (20 mEq total) by mouth daily. (Patient not taking: Reported on 03/10/2023) 30 tablet 0   No facility-administered medications prior to visit.    Family History  Problem Relation Age of Onset   Liver disease Mother    Depression Mother    Liver disease Father    Diabetes Father    Hypertension Father    Liver disease Sister    Diabetes Other    Colon cancer Neg Hx    Esophageal cancer Neg Hx    Stomach cancer Neg Hx    Colon polyps Neg Hx     Social History   Socioeconomic History   Marital status: Single    Spouse name: Not on file   Number of children: 0   Years of education: Not on file   Highest education level: 12th grade  Occupational History   Not on file  Tobacco Use   Smoking status: Some Days    Types: Cigars   Smokeless tobacco: Never   Tobacco comments:    Cigars occasional  Vaping Use   Vaping status: Never Used  Substance and Sexual Activity   Alcohol use: Not Currently    Comment: Beer/wine 1 - 3 times a week (drinks heavily on weekends)   Drug use: Never    Comment: history of cocaine abuse   Sexual activity: Yes  Other Topics Concern   Not on file  Social History Narrative  Manufacturing systems engineer.  Single. Gets regular exercise.             Social Determinants of Health   Financial Resource Strain: Low Risk  (03/09/2023)   Overall Financial Resource Strain (CARDIA)    Difficulty of Paying Living Expenses: Not hard at all  Food Insecurity: No Food Insecurity (03/09/2023)   Hunger Vital Sign    Worried About Running Out of Food in the Last Year: Never true    Ran Out of Food in the Last Year: Never true  Transportation Needs: No Transportation Needs  (03/09/2023)   PRAPARE - Administrator, Civil Service (Medical): No    Lack of Transportation (Non-Medical): No  Physical Activity: Insufficiently Active (03/09/2023)   Exercise Vital Sign    Days of Exercise per Week: 2 days    Minutes of Exercise per Session: 10 min  Stress: No Stress Concern Present (03/09/2023)   [employer] of Occupational Health - Occupational Stress Questionnaire    Feeling of Stress : Only a little  Social Connections: Socially Isolated (03/09/2023)   Social Connection and Isolation Panel [NHANES]    Frequency of Communication with Friends and Family: More than three times a week    Frequency of Social Gatherings with Friends and Family: More than three times a week    Attends Religious Services: Never    Database administrator or Organizations: No    Attends Engineer, structural: Not on file    Marital Status: Never married  Intimate Partner Violence: Not At Risk (12/29/2022)   Humiliation, Afraid, Rape, and Kick questionnaire    Fear of Current or Ex-Partner: No    Emotionally Abused: No    Physically Abused: No    Sexually Abused: No                                                                                                  Objective:  Physical Exam: BP 130/78 (BP Location: Left Arm, Patient Position: Sitting, Cuff Size: Large)   Pulse 61   Temp (!) 97.4 F (36.3 C) (Temporal)   Wt 274 lb 6.4 oz (124.5 kg)   SpO2 98%   BMI 45.66 kg/m     Physical Exam Constitutional:      Appearance: Normal appearance.  HENT:     Head: Normocephalic and atraumatic.     Right Ear: Hearing, tympanic membrane and ear canal normal.     Left Ear: Hearing, tympanic membrane and ear canal normal.     Nose: Nose normal.  Eyes:     General: No scleral icterus.       Right eye: No discharge.        Left eye: No discharge.     Extraocular Movements: Extraocular movements intact.  Cardiovascular:     Rate and Rhythm: Normal rate and  regular rhythm.     Heart sounds: Normal heart sounds.  Pulmonary:     Effort: Pulmonary effort is normal.     Breath sounds: Wheezing present.  Abdominal:     Palpations: Abdomen is soft.  Tenderness: There is no abdominal tenderness.  Skin:    General: Skin is warm.     Findings: No rash.  Neurological:     General: No focal deficit present.     Mental Status: He is alert.     Cranial Nerves: No cranial nerve deficit.  Psychiatric:        Mood and Affect: Mood normal.        Behavior: Behavior normal.        Thought Content: Thought content normal.        Judgment: Judgment normal.     CT ABDOMEN PELVIS W CONTRAST  Result Date: 02/20/2023 CLINICAL DATA:  Left-sided abdominal pain diarrhea EXAM: CT ABDOMEN AND PELVIS WITH CONTRAST TECHNIQUE: Multidetector CT imaging of the abdomen and pelvis was performed using the standard protocol following bolus administration of intravenous contrast. RADIATION DOSE REDUCTION: This exam was performed according to the departmental dose-optimization program which includes automated exposure control, adjustment of the mA and/or kV according to patient size and/or use of iterative reconstruction technique. CONTRAST:  OMNIPAQUE IOHEXOL 300 MG/ML  SOLN COMPARISON:  Ultrasound 09/12/2021 FINDINGS: Lower chest: Lung bases demonstrate no acute airspace disease. Calcified granuloma at the right base. Cardiomegaly. Hepatobiliary: Liver cirrhosis. No calcified gallstone or biliary dilatation. Pancreas: No ductal dilatation.  No definitive inflammation Spleen: Enlarged with AP diameter of 18 cm. Adrenals/Urinary Tract: Adrenal glands are within normal limits. Kidneys show no hydronephrosis. The bladder is unremarkable Stomach/Bowel: Stomach nonenlarged. No dilated small bowel. No acute bowel wall thickening. Vascular/Lymphatic: Nonaneurysmal aorta. Recanalized paraumbilical vein. Small gastroesophageal varices. Multiple subcentimeter retroperitoneal lymph  nodes. Reproductive: Prostate is unremarkable. Other: No free air. Trace volume free fluid in the left upper quadrant. Left greater than right subcutaneous flank edema. Mild fluid and thickening in the anterior pararenal spaces. Generalized hazy appearance of the mesentery. Musculoskeletal: No acute or suspicious osseous abnormality IMPRESSION: 1. Liver cirrhosis with evidence of portal hypertension including splenomegaly, small gastroesophageal varices, and trace volume ascites. 2. Generalized hazy appearance of the mesentery which could be due to nonspecific mesenteric edema. There is some fluid and haziness in the pararenal spaces, could correlate with pancreatic enzymes to exclude pancreatitis though imaging findings are not strongly suggestive of this diagnosis. 3. Cardiomegaly. Electronically Signed   By: Jasmine Pang M.D.   On: 02/20/2023 23:47    Recent Results (from the past 2160 hour(s))  Lipase, blood     Status: None   Collection Time: 02/20/23  3:19 PM  Result Value Ref Range   Lipase 50 11 - 51 U/L    Comment: Performed at [facility], [address] [place], [place] [address]  Comprehensive metabolic panel     Status: Abnormal   Collection Time: 02/20/23  3:19 PM  Result Value Ref Range   Sodium 138 135 - 145 mmol/L   Potassium 4.0 3.5 - 5.1 mmol/L   Chloride 106 98 - 111 mmol/L   CO2 27 22 - 32 mmol/L   Glucose, Bld 92 70 - 99 mg/dL    Comment: Glucose reference range applies only to samples taken after fasting for at least 8 hours.   BUN 9 6 - 20 mg/dL   Creatinine, Ser 2.13 (L) 0.61 - 1.24 mg/dL   Calcium 8.4 (L) 8.9 - 10.3 mg/dL   Total Protein 6.3 (L) 6.5 - 8.1 g/dL   Albumin 2.7 (L) 3.5 - 5.0 g/dL   AST 71 (H) 15 - 41 U/L   ALT 41  0 - 44 U/L   Alkaline Phosphatase 158 (H) 38 - 126 U/L   Total Bilirubin 2.3 (H) 0.3 - 1.2 mg/dL   GFR, Estimated >16 >10 mL/min    Comment: (NOTE) Calculated using the CKD-EPI Creatinine Equation (2021)    Anion gap  5 5 - 15    Comment: Performed at [facility], [address], [place], [place] [address]  CBC     Status: Abnormal   Collection Time: 02/20/23  3:19 PM  Result Value Ref Range   WBC 3.8 (L) 4.0 - 10.5 K/uL   RBC 3.75 (L) 4.22 - 5.81 MIL/uL   Hemoglobin 8.9 (L) 13.0 - 17.0 g/dL   HCT 40.9 (L) 81.1 - 91.4 %   MCV 80.8 80.0 - 100.0 fL   MCH 23.7 (L) 26.0 - 34.0 pg   MCHC 29.4 (L) 30.0 - 36.0 g/dL   RDW 78.2 (H) 95.6 - 21.3 %   Platelets 109 (L) 150 - 400 K/uL   nRBC 0.0 0.0 - 0.2 %    Comment: Performed at [facility], [address] [place], [place] [address]  Urinalysis, Routine w reflex microscopic -Urine, Unspecified Source     Status: Abnormal   Collection Time: 02/20/23  3:20 PM  Result Value Ref Range   Color, Urine AMBER (A) YELLOW    Comment: BIOCHEMICALS MAY BE AFFECTED BY COLOR   APPearance CLEAR CLEAR   Specific Gravity, Urine 1.015 1.005 - 1.030   pH 6.0 5.0 - 8.0   Glucose, UA NEGATIVE NEGATIVE mg/dL   Hgb urine dipstick NEGATIVE NEGATIVE   Bilirubin Urine NEGATIVE NEGATIVE   Ketones, ur NEGATIVE NEGATIVE mg/dL   Protein, ur NEGATIVE NEGATIVE mg/dL   Nitrite NEGATIVE NEGATIVE   Leukocytes,Ua NEGATIVE NEGATIVE    Comment: Performed at [facility], [address] [place], [place] [address]        Garner Nash, MD, MS

## 2023-05-06 NOTE — Assessment & Plan Note (Signed)
Viral-triggered reactive airway disease exacerbation. Plan:  Prednisone 50 mg orally once daily for 5 days. Instructed to take with food to minimize gastrointestinal discomfort. Albuterol inhaler: 1-2 puffs every 4-6 hours as needed for wheezing and cough. Advised to monitor for any worsening symptoms, including chest pain, increased shortness of breath, fever, or chills. If symptoms do not improve or worsen, instructed to return for further evaluation. Advised to go to ED if symptoms are severe. Discussed the importance of adhering to the medication regimen. Educated about potential side effects of steroids and proper use of the albuterol inhaler.

## 2023-05-12 ENCOUNTER — Other Ambulatory Visit: Payer: Self-pay | Admitting: Physical Medicine and Rehabilitation

## 2023-05-14 ENCOUNTER — Ambulatory Visit: Payer: BC Managed Care – PPO | Admitting: Family Medicine

## 2023-05-14 ENCOUNTER — Encounter: Payer: Self-pay | Admitting: Family Medicine

## 2023-05-14 VITALS — BP 124/72 | HR 74 | Temp 99.6°F | Ht 65.0 in | Wt 279.6 lb

## 2023-05-14 DIAGNOSIS — R6889 Other general symptoms and signs: Secondary | ICD-10-CM | POA: Diagnosis not present

## 2023-05-14 DIAGNOSIS — J22 Unspecified acute lower respiratory infection: Secondary | ICD-10-CM | POA: Diagnosis not present

## 2023-05-14 DIAGNOSIS — J4530 Mild persistent asthma, uncomplicated: Secondary | ICD-10-CM

## 2023-05-14 DIAGNOSIS — Z1152 Encounter for screening for COVID-19: Secondary | ICD-10-CM | POA: Diagnosis not present

## 2023-05-14 LAB — POCT INFLUENZA A/B
Influenza A, POC: NEGATIVE
Influenza B, POC: NEGATIVE

## 2023-05-14 LAB — POC COVID19 BINAXNOW: SARS Coronavirus 2 Ag: NEGATIVE

## 2023-05-14 MED ORDER — BENZONATATE 200 MG PO CAPS
200.0000 mg | ORAL_CAPSULE | Freq: Two times a day (BID) | ORAL | 0 refills | Status: DC | PRN
Start: 2023-05-14 — End: 2023-11-06

## 2023-05-14 MED ORDER — AMOXICILLIN-POT CLAVULANATE 875-125 MG PO TABS: 1.0000 | ORAL_TABLET | Freq: Two times a day (BID) | ORAL | 0 refills | Status: DC

## 2023-05-14 MED ORDER — PREDNISONE 20 MG PO TABS
20.0000 mg | ORAL_TABLET | Freq: Two times a day (BID) | ORAL | 0 refills | Status: AC
Start: 2023-05-14 — End: 2023-05-21

## 2023-05-14 MED ORDER — METHYLPREDNISOLONE SODIUM SUCC 125 MG IJ SOLR
125.0000 mg | Freq: Once | INTRAMUSCULAR | Status: AC
Start: 2023-05-14 — End: 2023-05-14
  Administered 2023-05-14: 125 mg via INTRAMUSCULAR

## 2023-05-14 NOTE — Progress Notes (Signed)
Established Patient Office Visit   Subjective:  Patient ID: Thomas [name], male    DOB: December 04, 1975  Age: 47 y.o. MRN: [id number]  Chief Complaint  Patient presents with   Cough    Cough and congestion. Pt states he now has a low grade fever, congestion,     Cough Associated symptoms include myalgias and wheezing. Pertinent negatives include no eye redness or rash.   Encounter Diagnoses  Name Primary?   Flu-like symptoms Yes   Encounter for screening for COVID-19    Lower resp. tract infection    Mild persistent reactive airway disease without complication    Over 1 week history of URI signs and symptoms.  It is now moved down to his chest.  There is chest congestion and cough productive of white/yellow phlegm with wheezing.  There are myalgias.  Flu and COVID test were negative today.   Review of Systems  Constitutional: Negative.   HENT: Negative.    Eyes:  Negative for blurred vision, discharge and redness.  Respiratory:  Positive for cough, sputum production and wheezing.   Cardiovascular: Negative.   Gastrointestinal:  Negative for abdominal pain.  Genitourinary: Negative.   Musculoskeletal:  Positive for myalgias.  Skin:  Negative for rash.  Neurological:  Negative for tingling, loss of consciousness and weakness.  Endo/Heme/Allergies:  Negative for polydipsia.     Current Outpatient Medications:    amoxicillin-clavulanate (AUGMENTIN) 875-125 MG tablet, Take 1 tablet by mouth 2 (two) times daily., Disp: 20 tablet, Rfl: 0   benzonatate (TESSALON) 200 MG capsule, Take 1 capsule (200 mg total) by mouth 2 (two) times daily as needed for cough., Disp: 20 capsule, Rfl: 0   predniSONE (DELTASONE) 20 MG tablet, Take 1 tablet (20 mg total) by mouth 2 (two) times daily with a meal for 7 days., Disp: 14 tablet, Rfl: 0   acetaminophen (TYLENOL) 325 MG tablet, Take 1-2 tablets (325-650 mg total) by mouth every 4 (four) hours as needed for mild pain. (Patient not taking: Reported  on 05/06/2023), Disp: , Rfl:    albuterol (VENTOLIN HFA) 108 (90 Base) MCG/ACT inhaler, Inhale 2 puffs into the lungs every 6 (six) hours as needed for wheezing or shortness of breath., Disp: 8 g, Rfl: 0   cyclobenzaprine (FLEXERIL) 5 MG tablet, Take 1 tablet (5 mg total) by mouth 3 (three) times daily as needed for muscle spasms. (Patient not taking: Reported on 05/06/2023), Disp: 90 tablet, Rfl: 0   diclofenac Sodium (VOLTAREN) 1 % GEL, Apply 2 g topically 4 (four) times daily. (Patient not taking: Reported on 05/06/2023), Disp: 350 g, Rfl: 0   ferrous sulfate 325 (65 FE) MG tablet, Take 1 tablet (325 mg total) by mouth daily with breakfast. (Patient not taking: Reported on 05/06/2023), Disp: 30 tablet, Rfl: 3   furosemide (LASIX) 40 MG tablet, Take 1 tablet (40 mg total) by mouth daily., Disp: 30 tablet, Rfl: 5   lipase/protease/amylase (CREON) 36000 UNITS CPEP capsule, Take 2 capsules (72,000 Units total) by mouth 3 (three) times daily with meals. May also take 1 capsule (36,000 Units total) as needed (with snacks). (Patient not taking: Reported on 03/10/2023), Disp: 240 capsule, Rfl: 11   nadolol (CORGARD) 20 MG tablet, Take 1 tablet (20 mg total) by mouth daily., Disp: 30 tablet, Rfl: 11   pantoprazole (PROTONIX) 40 MG tablet, Take 1 tablet (40 mg total) by mouth daily., Disp: 90 tablet, Rfl: 3   potassium chloride SA (KLOR-CON M) 20 MEQ tablet, Take 1  tablet (20 mEq total) by mouth daily. (Patient not taking: Reported on 03/10/2023), Disp: 30 tablet, Rfl: 0   Objective:     BP 124/72   Pulse 74   Temp 99.6 F (37.6 C)   Ht 5\' 5"  (1.651 m)   Wt 279 lb 9.6 oz (126.8 kg)   SpO2 97%   BMI 46.53 kg/m    Physical Exam Constitutional:      General: He is not in acute distress.    Appearance: Normal appearance. He is not ill-appearing, toxic-appearing or diaphoretic.  HENT:     Head: Normocephalic and atraumatic.     Right Ear: External ear normal.     Left Ear: External ear normal.   Eyes:     General: No scleral icterus.       Right eye: No discharge.        Left eye: No discharge.     Extraocular Movements: Extraocular movements intact.     Conjunctiva/sclera: Conjunctivae normal.  Pulmonary:     Effort: Pulmonary effort is normal. No respiratory distress.     Breath sounds: Wheezing present. No rhonchi or rales.  Skin:    General: Skin is warm and dry.  Neurological:     Mental Status: He is alert and oriented to person, place, and time.  Psychiatric:        Mood and Affect: Mood normal.        Behavior: Behavior normal.      Results for orders placed or performed in visit on 05/14/23  POC COVID-19 BinaxNow  Result Value Ref Range   SARS Coronavirus 2 Ag Negative Negative  POCT Influenza A/B  Result Value Ref Range   Influenza A, POC Negative Negative   Influenza B, POC Negative Negative      The 10-year ASCVD risk score (Arnett DK, et al., 2019) is: 3.1%    Assessment & Plan:   Flu-like symptoms -     POCT Influenza A/B  Encounter for screening for COVID-19 -     POC COVID-19 BinaxNow  Lower resp. tract infection -     Amoxicillin-Pot Clavulanate; Take 1 tablet by mouth 2 (two) times daily.  Dispense: 20 tablet; Refill: 0 -     Benzonatate; Take 1 capsule (200 mg total) by mouth 2 (two) times daily as needed for cough.  Dispense: 20 capsule; Refill: 0  Mild persistent reactive airway disease without complication -     methylPREDNISolone Sodium Succ -     predniSONE; Take 1 tablet (20 mg total) by mouth 2 (two) times daily with a meal for 7 days.  Dispense: 14 tablet; Refill: 0    Return in about 1 week (around 05/21/2023), or To emergency room if not proving in the next 3 to 4 days.    [name], MD

## 2023-05-17 DIAGNOSIS — K7460 Unspecified cirrhosis of liver: Secondary | ICD-10-CM | POA: Diagnosis not present

## 2023-05-17 DIAGNOSIS — J45909 Unspecified asthma, uncomplicated: Secondary | ICD-10-CM | POA: Diagnosis not present

## 2023-05-17 DIAGNOSIS — K922 Gastrointestinal hemorrhage, unspecified: Secondary | ICD-10-CM | POA: Diagnosis not present

## 2023-05-17 DIAGNOSIS — I8511 Secondary esophageal varices with bleeding: Secondary | ICD-10-CM | POA: Diagnosis not present

## 2023-05-29 ENCOUNTER — Other Ambulatory Visit: Payer: Self-pay | Admitting: Family Medicine

## 2023-05-29 DIAGNOSIS — K7030 Alcoholic cirrhosis of liver without ascites: Secondary | ICD-10-CM

## 2023-06-10 DIAGNOSIS — K7460 Unspecified cirrhosis of liver: Secondary | ICD-10-CM | POA: Diagnosis not present

## 2023-06-10 DIAGNOSIS — J45909 Unspecified asthma, uncomplicated: Secondary | ICD-10-CM | POA: Diagnosis not present

## 2023-06-10 DIAGNOSIS — K922 Gastrointestinal hemorrhage, unspecified: Secondary | ICD-10-CM | POA: Diagnosis not present

## 2023-06-10 DIAGNOSIS — I8511 Secondary esophageal varices with bleeding: Secondary | ICD-10-CM | POA: Diagnosis not present

## 2023-06-17 DIAGNOSIS — K922 Gastrointestinal hemorrhage, unspecified: Secondary | ICD-10-CM | POA: Diagnosis not present

## 2023-06-17 DIAGNOSIS — K7460 Unspecified cirrhosis of liver: Secondary | ICD-10-CM | POA: Diagnosis not present

## 2023-06-17 DIAGNOSIS — I8511 Secondary esophageal varices with bleeding: Secondary | ICD-10-CM | POA: Diagnosis not present

## 2023-06-17 DIAGNOSIS — J45909 Unspecified asthma, uncomplicated: Secondary | ICD-10-CM | POA: Diagnosis not present

## 2023-06-18 ENCOUNTER — Other Ambulatory Visit: Payer: Self-pay | Admitting: Nurse Practitioner

## 2023-06-18 DIAGNOSIS — F1090 Alcohol use, unspecified, uncomplicated: Secondary | ICD-10-CM | POA: Diagnosis not present

## 2023-06-18 DIAGNOSIS — R188 Other ascites: Secondary | ICD-10-CM | POA: Insufficient documentation

## 2023-06-18 DIAGNOSIS — K7030 Alcoholic cirrhosis of liver without ascites: Secondary | ICD-10-CM

## 2023-06-18 DIAGNOSIS — I8510 Secondary esophageal varices without bleeding: Secondary | ICD-10-CM | POA: Diagnosis not present

## 2023-06-22 ENCOUNTER — Other Ambulatory Visit (HOSPITAL_COMMUNITY): Payer: Self-pay | Admitting: Nurse Practitioner

## 2023-06-22 DIAGNOSIS — K7030 Alcoholic cirrhosis of liver without ascites: Secondary | ICD-10-CM

## 2023-06-22 DIAGNOSIS — F1090 Alcohol use, unspecified, uncomplicated: Secondary | ICD-10-CM

## 2023-06-22 DIAGNOSIS — I517 Cardiomegaly: Secondary | ICD-10-CM

## 2023-06-24 ENCOUNTER — Ambulatory Visit: Payer: BC Managed Care – PPO | Admitting: Gastroenterology

## 2023-06-24 ENCOUNTER — Other Ambulatory Visit: Payer: Self-pay | Admitting: Family Medicine

## 2023-06-25 NOTE — Telephone Encounter (Signed)
Refill was sent into pharmacy on 06/24/23 by PCP.

## 2023-07-06 ENCOUNTER — Ambulatory Visit
Admission: RE | Admit: 2023-07-06 | Discharge: 2023-07-06 | Disposition: A | Discharge status: Home / Self Care | Payer: BC Managed Care – PPO | Source: Ambulatory Visit | Attending: Nurse Practitioner | Admitting: Nurse Practitioner

## 2023-07-06 DIAGNOSIS — R161 Splenomegaly, not elsewhere classified: Secondary | ICD-10-CM | POA: Diagnosis not present

## 2023-07-06 DIAGNOSIS — K7030 Alcoholic cirrhosis of liver without ascites: Secondary | ICD-10-CM

## 2023-07-06 DIAGNOSIS — K7460 Unspecified cirrhosis of liver: Secondary | ICD-10-CM | POA: Diagnosis not present

## 2023-07-13 ENCOUNTER — Ambulatory Visit (HOSPITAL_COMMUNITY): Payer: BC Managed Care – PPO | Attending: Nurse Practitioner

## 2023-07-13 DIAGNOSIS — K7030 Alcoholic cirrhosis of liver without ascites: Secondary | ICD-10-CM | POA: Insufficient documentation

## 2023-07-13 DIAGNOSIS — I517 Cardiomegaly: Secondary | ICD-10-CM | POA: Diagnosis not present

## 2023-07-13 DIAGNOSIS — F1090 Alcohol use, unspecified, uncomplicated: Secondary | ICD-10-CM | POA: Diagnosis not present

## 2023-07-14 LAB — ECHOCARDIOGRAM COMPLETE
Area-P 1/2: 3.72 cm2
S' Lateral: 3.26 cm

## 2023-07-16 NOTE — Progress Notes (Deleted)
 Chief Complaint:follow-up  Primary GI Doctor: Dr. Shila  HPI: [name] is a 48 year old male with a past medical history as listed below including alcoholic cirrhosis, IDA and multiple others, known to Dr. Shila, who presents to clinic today with a complaint of diarrhea and abdominal pain.      02/12/2023 EGD with small less than 5 mm esophageal varices with no bleeding and no stigmata of recent bleeding, portal hypertensive gastropathy and erythematous mucosa in the prepyloric region of the stomach.  Patient given Nadolol  20 mg daily and told to titrate to a goal of heart rate 50-60.  Also Creon  72,000 units with meals 3 times daily for pancreatic insufficiency and chronic diarrhea.    02/19/2023 patient called and described the medication given to stop his diarrhea was not helping and needed swelling on the left side of his abdomen.  At that time discussed he was taking Creon  with meals and snacks but diarrhea was no better.  Describes chills.  At that time CT reviewed from 9/13 patient had mild pancreatitis.      02/20/2023 patient seen in the ED and at that time described abdominal distention and pain.  He explains it has been going on since his EGD last Thursday as above.  Found to have 2+ pitting edema on the left.  (Cast on the right foot).  AST 71, alk phos 158, total bili 2.3.  Hemoglobin 8.9.  Platelets 109.  Lipase normal.  At that time findings of what Thomas [name] be cellulitis in left lower quadrant.  He was given some IV Lasix  to help with diuresis.    02/20/2023 CT of the abdomen pelvis with cirrhosis and evidence of portal hypertension including splenomegaly, gastroesophageal varices and trace volume ascites as well as generalized hazy appearance of the mesentery.      ***02/23/23 patient seen in office By Harlene, [place] with complaints of swelling and pain on left side of abdomen. Continues on Lasix  40 mg daily. Never was prescribed antibiotics even though there is discussion about cellulitis.  Continues on Nadolol  20 mg daily but he discontinued the Pantoprazole  which he was taking 40 mg twice daily. Also continues with diarrhea, currently had been on Creon  2 with a meal and 1 with a snack but was still having up to 8 or more loose stools a day. Previous stool studies including stool culture and C. difficile negative.  Restarted the patient on his Pantoprazole  40 mg twice daily,  Continue Nadolol  20 mg daily  Hold Creon  for now Continue Lasix  40 mg daily  02/25/2023 patient referred to [facility] 03/10/23 patient prescribed Cephalexin  TID for 10 days for cellulitis by PCP  ***06/18/2023 patient seen by Stephane Quest, NP at [facility].   Per her note: Patient does have indication for liver transplant plant with decompensated cirrhosis however he has had recent alcohol  use which is a contraindication.  Additionally his MELD score is not competitive to receive an organ offer.  It was reviewed with patient the need to discontinue all alcohol  use and strongly recommended alcohol  treatment program. Liver synthetic function is impaired with hypoalbuminemia, hyperbilirubinemia, hypercoagulability. Will plan for labs to reevaluate. Hepatoma screening - patient is due for hepatoma screening. Will arrange RUQ US  and check AFP with labs.  Hepatic encephalopathy - no evidence of encephalopathy and reviewed signs/symptoms of encephalopthy and indication to contact our office. Nutrition: Recommended patient have a calorie intake goal of 1300-1700 cal/day with 82-102 g of protein.  HAV and HBV vaccination  recommended. He is currently on nadolol  20 mg daily with good rate control. Could consider transitioning from nadolol  to carvedilol.  Plan- obtain labs, complete Abd U/, echo, abstain from alcohol , fup 6 mths   Interval History  Patient admits/denies GERD Patient admits/denies dysphagia Patient admits/denies nausea, vomiting, or weight loss  Patient admits/denies altered bowel  habits Patient admits/denies abdominal pain Patient admits/denies rectal bleeding   Denies/Admits alcohol  Denies/Admits smoking Denies/Admits NSAID use. Denies/Admits they are on blood thinners.  Patients last colonoscopy Patients last EGD  Patient's family history includes  Wt Readings from Last 3 Encounters:  05/14/23 279 lb 9.6 oz (126.8 kg)  05/06/23 274 lb 6.4 oz (124.5 kg)  02/20/23 265 lb (120.2 kg)      Past Medical History:  Diagnosis Date   Acute conjunctivitis of both eyes 10/22/2021   Acute sinusitis 06/16/2013   Alcoholic cirrhosis (HCC)    Alcoholic fatty liver 01/16/2010   Needs final HBV and HAV vaccines on or after 10/25/2012    Alcoholism (HCC) 12/25/2011   Allergic rhinitis    Childhood asthma    Elevated transaminase level 06/10/2007   AST: 80 ALT: 136 in 8/11: Hepatitis A., B and C negative.    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    Sleep apnea     Past Surgical History:  Procedure Laterality Date   ESOPHAGEAL BANDING  12/26/2022   Procedure: ESOPHAGEAL BANDING;  Surgeon: Shila Gustav GAILS, MD;  Location: [facility];  Service: Gastroenterology;;   ESOPHAGOGASTRODUODENOSCOPY (EGD) WITH PROPOFOL  N/A 07/05/2022   Procedure: ESOPHAGOGASTRODUODENOSCOPY (EGD) WITH PROPOFOL ;  Surgeon: Shila Gustav GAILS, MD;  Location: [facility];  Service: Gastroenterology;  Laterality: N/A;   ESOPHAGOGASTRODUODENOSCOPY (EGD) WITH PROPOFOL  N/A 12/26/2022   Procedure: ESOPHAGOGASTRODUODENOSCOPY (EGD) WITH PROPOFOL ;  Surgeon: Shila Gustav GAILS, MD;  Location: [facility];  Service: Gastroenterology;  Laterality: N/A;   ESOPHAGOGASTRODUODENOSCOPY (EGD) WITH PROPOFOL  N/A 02/12/2023   Procedure: ESOPHAGOGASTRODUODENOSCOPY (EGD) WITH PROPOFOL ;  Surgeon: Shila Gustav GAILS, MD;   Location: [facility];  Service: Gastroenterology;  Laterality: N/A;   ORIF ANKLE FRACTURE Right 12/28/2022   Procedure: OPEN REDUCTION INTERNAL FIXATION (ORIF) ANKLE FRACTURE;  Surgeon: Josefina Chew, MD;  Location: [facility] OR;  Service: Orthopedics;  Laterality: Right;    Current Outpatient Medications  Medication Sig Dispense Refill   acetaminophen  (TYLENOL ) 325 MG tablet Take 1-2 tablets (325-650 mg total) by mouth every 4 (four) hours as needed for mild pain. (Patient not taking: Reported on 05/06/2023)     albuterol  (VENTOLIN  HFA) 108 (90 Base) MCG/ACT inhaler Inhale 2 puffs into the lungs every 6 (six) hours as needed for wheezing or shortness of breath. 8 g 0   amoxicillin -clavulanate (AUGMENTIN ) 875-125 MG tablet Take 1 tablet by mouth 2 (two) times daily. 20 tablet 0   benzonatate  (TESSALON ) 200 MG capsule Take 1 capsule (200 mg total) by mouth 2 (two) times daily as needed for cough. 20 capsule 0   cyclobenzaprine  (FLEXERIL ) 5 MG tablet Take 1 tablet (5 mg total) by mouth 3 (three) times daily as needed for muscle spasms. (Patient not taking: Reported on 05/06/2023) 90 tablet 0   diclofenac  Sodium (VOLTAREN ) 1 % GEL Apply 2 g topically 4 (four) times daily. (Patient not taking: Reported on 05/06/2023) 350 g 0   ferrous sulfate  325 (65  FE) MG tablet Take 1 tablet (325 mg total) by mouth daily with breakfast. (Patient not taking: Reported on 05/06/2023) 30 tablet 3   furosemide  (LASIX ) 40 MG tablet Take 1 tablet (40 mg total) by mouth daily. 30 tablet 5   lipase/protease/amylase (CREON ) 36000 UNITS CPEP capsule Take 2 capsules (72,000 Units total) by mouth 3 (three) times daily with meals. Duru Reiger also take 1 capsule (36,000 Units total) as needed (with snacks). (Patient not taking: Reported on 03/10/2023) 240 capsule 11   nadolol  (CORGARD ) 20 MG tablet Take 1 tablet (20 mg total) by mouth daily. 30 tablet 11   pantoprazole  (PROTONIX ) 40 MG tablet TAKE 1 TABLET BY MOUTH TWICE A DAY 180 tablet 0    potassium chloride  SA (KLOR-CON  M) 20 MEQ tablet Take 1 tablet (20 mEq total) by mouth daily. (Patient not taking: Reported on 03/10/2023) 30 tablet 0   No current facility-administered medications for this visit.    Allergies as of 07/17/2023 - Review Complete 05/14/2023  Allergen Reaction Noted   Codeine Other (See Comments) 02/13/2015    Family History  Problem Relation Age of Onset   Liver disease Mother    Depression Mother    Liver disease Father    Diabetes Father    Hypertension Father    Liver disease Sister    Diabetes Other    Colon cancer Neg Hx    Esophageal cancer Neg Hx    Stomach cancer Neg Hx    Colon polyps Neg Hx     Review of Systems:    Constitutional: No weight loss, fever, chills, weakness or fatigue HEENT: Eyes: No change in vision               Ears, Nose, Throat:  No change in hearing or congestion Skin: No rash or itching Cardiovascular: No chest pain, chest pressure or palpitations   Respiratory: No SOB or cough Gastrointestinal: See HPI and otherwise negative Genitourinary: No dysuria or change in urinary frequency Neurological: No headache, dizziness or syncope Musculoskeletal: No new muscle or joint pain Hematologic: No bleeding or bruising Psychiatric: No history of depression or anxiety    Physical Exam:  Vital signs: There were no vitals taken for this visit.  Constitutional:   Pleasant Caucasian male*** appears to be in NAD, Well developed, Well nourished, alert and cooperative Head:  Normocephalic and atraumatic. Eyes:   PEERL, EOMI. No icterus. Conjunctiva pink. Ears:  Normal auditory acuity. Neck:  Supple Throat: Oral cavity and pharynx without inflammation, swelling or lesion.  Respiratory: Respirations even and unlabored. Lungs clear to auscultation bilaterally.   No wheezes, crackles, or rhonchi.  Cardiovascular: Normal S1, S2. Regular rate and rhythm. No peripheral edema, cyanosis or pallor.  Gastrointestinal:  Soft,  nondistended, nontender. No rebound or guarding. Normal bowel sounds. No appreciable masses or hepatomegaly. Rectal:  Not performed.  Anoscopy: Msk:  Symmetrical without gross deformities. Without edema, no deformity or joint abnormality.  Neurologic:  Alert and  oriented x4;  grossly normal neurologically.  Skin:   Dry and intact without significant lesions or rashes. Psychiatric: Oriented to person, place and time. Demonstrates good judgement and reason without abnormal affect or behaviors.  RELEVANT LABS AND IMAGING: CBC    Latest Ref Rng & Units 02/20/2023    3:19 PM 02/03/2023    3:43 PM 01/12/2023    6:13 AM  CBC  WBC 4.0 - 10.5 K/uL 3.8  3.3  2.8   Hemoglobin 13.0 - 17.0 g/dL 8.9  8.6  C 7.5   Hematocrit 39.0 - 52.0 % 30.3  28.2  25.8   Platelets 150 - 400 K/uL 109  103.0  123     C Corrected result     CMP     Latest Ref Rng & Units 02/20/2023    3:19 PM 02/03/2023    3:43 PM 01/12/2023    6:13 AM  CMP  Glucose 70 - 99 mg/dL 92  80  872   BUN 6 - 20 mg/dL 9  12  12    Creatinine 0.61 - 1.24 mg/dL 9.44  9.33  9.18   Sodium 135 - 145 mmol/L 138  142  142   Potassium 3.5 - 5.1 mmol/L 4.0  4.0  3.7   Chloride 98 - 111 mmol/L 106  109  107   CO2 22 - 32 mmol/L 27  27  27    Calcium 8.9 - 10.3 mg/dL 8.4  8.2  8.3   Total Protein 6.5 - 8.1 g/dL 6.3  5.7    Total Bilirubin 0.3 - 1.2 mg/dL 2.3  1.8    Alkaline Phos 38 - 126 U/L 158  172    AST 15 - 41 U/L 71  59    ALT 0 - 44 U/L 41  37       Lab Results  Component Value Date   TSH 2.85 02/07/2019     Assessment: 1. ***  Plan: 1. ***   Thank you for the courtesy of this consult. Please call me with any questions or concerns.   Thomas Carelli, FNP-C [facility] 07/16/2023, 10:11 AM  Cc: Thomas [name] Thomas [name]

## 2023-07-17 ENCOUNTER — Ambulatory Visit: Payer: BC Managed Care – PPO | Admitting: Gastroenterology

## 2023-07-18 DIAGNOSIS — I8511 Secondary esophageal varices with bleeding: Secondary | ICD-10-CM | POA: Diagnosis not present

## 2023-07-18 DIAGNOSIS — K7460 Unspecified cirrhosis of liver: Secondary | ICD-10-CM | POA: Diagnosis not present

## 2023-07-18 DIAGNOSIS — K922 Gastrointestinal hemorrhage, unspecified: Secondary | ICD-10-CM | POA: Diagnosis not present

## 2023-07-18 DIAGNOSIS — J45909 Unspecified asthma, uncomplicated: Secondary | ICD-10-CM | POA: Diagnosis not present

## 2023-07-20 ENCOUNTER — Encounter: Payer: Self-pay | Admitting: Family Medicine

## 2023-07-22 ENCOUNTER — Ambulatory Visit: Payer: BC Managed Care – PPO | Admitting: Physician Assistant

## 2023-08-15 DIAGNOSIS — K7460 Unspecified cirrhosis of liver: Secondary | ICD-10-CM | POA: Diagnosis not present

## 2023-08-15 DIAGNOSIS — J45909 Unspecified asthma, uncomplicated: Secondary | ICD-10-CM | POA: Diagnosis not present

## 2023-08-15 DIAGNOSIS — K922 Gastrointestinal hemorrhage, unspecified: Secondary | ICD-10-CM | POA: Diagnosis not present

## 2023-08-15 DIAGNOSIS — I8511 Secondary esophageal varices with bleeding: Secondary | ICD-10-CM | POA: Diagnosis not present

## 2023-08-31 ENCOUNTER — Telehealth: Payer: Self-pay | Admitting: Physician Assistant

## 2023-08-31 NOTE — Telephone Encounter (Signed)
 He needs to be seen in person. Reschedule his appointment please. He is difficult to reach sometimes. Thank you.

## 2023-08-31 NOTE — Telephone Encounter (Signed)
 Patient called and stated that he would like to know if he can have a virtual appointment due to his father recently being discharged from the hospital and has to have several appointment and he can not come to our practice. Patient is requesting a call back. Please advise.

## 2023-08-31 NOTE — Telephone Encounter (Signed)
 Patient was rescheduled for May the 30 th at 3:00 PM. Please advise.

## 2023-08-31 NOTE — Telephone Encounter (Signed)
 Left patient the message regarding his appointment.

## 2023-09-01 ENCOUNTER — Ambulatory Visit: Payer: BC Managed Care – PPO | Admitting: Physician Assistant

## 2023-09-01 DIAGNOSIS — R7989 Other specified abnormal findings of blood chemistry: Secondary | ICD-10-CM | POA: Diagnosis not present

## 2023-09-01 DIAGNOSIS — K7030 Alcoholic cirrhosis of liver without ascites: Secondary | ICD-10-CM | POA: Diagnosis not present

## 2023-09-01 DIAGNOSIS — D649 Anemia, unspecified: Secondary | ICD-10-CM | POA: Diagnosis not present

## 2023-09-15 DIAGNOSIS — K922 Gastrointestinal hemorrhage, unspecified: Secondary | ICD-10-CM | POA: Diagnosis not present

## 2023-09-15 DIAGNOSIS — I8511 Secondary esophageal varices with bleeding: Secondary | ICD-10-CM | POA: Diagnosis not present

## 2023-09-15 DIAGNOSIS — J45909 Unspecified asthma, uncomplicated: Secondary | ICD-10-CM | POA: Diagnosis not present

## 2023-09-21 ENCOUNTER — Other Ambulatory Visit: Payer: Self-pay | Admitting: Family Medicine

## 2023-11-06 ENCOUNTER — Ambulatory Visit: Admitting: Physician Assistant

## 2023-11-06 ENCOUNTER — Encounter: Payer: Self-pay | Admitting: Physician Assistant

## 2023-11-06 ENCOUNTER — Other Ambulatory Visit

## 2023-11-06 VITALS — BP 136/62 | HR 91 | Ht 65.0 in | Wt 270.2 lb

## 2023-11-06 DIAGNOSIS — F1090 Alcohol use, unspecified, uncomplicated: Secondary | ICD-10-CM

## 2023-11-06 DIAGNOSIS — K7030 Alcoholic cirrhosis of liver without ascites: Secondary | ICD-10-CM | POA: Diagnosis not present

## 2023-11-06 DIAGNOSIS — K529 Noninfective gastroenteritis and colitis, unspecified: Secondary | ICD-10-CM

## 2023-11-06 DIAGNOSIS — K7290 Hepatic failure, unspecified without coma: Secondary | ICD-10-CM

## 2023-11-06 MED ORDER — DIPHENOXYLATE-ATROPINE 2.5-0.025 MG PO TABS: 1.0000 | ORAL_TABLET | Freq: Four times a day (QID) | ORAL | 5 refills | Status: DC | PRN

## 2023-11-06 NOTE — Progress Notes (Signed)
 Chief Complaint: GERD  HPI:    Thomas [name] is a 48 year old Hispanic male, known to Dr. Leonia Raman, with a past medical history as listed below including decompensated alcoholic cirrhosis and reflux, who was referred to me by Tonna Frederic,* for a complaint of GERD.     10/15/2021 colonoscopy with one 3 mm polyp in the cecum, one 6 mm polyp in the transverse colon, single nonbleeding colonic angiodysplastic lesion and otherwise normal, repeat recommended in 3 years with 2-day bowel prep.  02/12/2023 EGD with small less than 5 mm esophageal varices with no bleeding and no stigmata of recent bleeding, portal hypertensive gastropathy and erythematous mucosa in the prepyloric region of the stomach.  Patient given Nadolol  20 mg daily and told to titrate to a goal of heart rate 50-60.  Also Creon  72,000 units with meals 3 times daily for pancreatic insufficiency and chronic diarrhea.    02/19/2023 patient called and described the medication given to stop his diarrhea was not helping and needed swelling on the left side of his abdomen.  At that time discussed he was taking Creon  with meals and snacks but diarrhea was no better.  Describes chills.  At that time CT reviewed from 9/13 patient had mild pancreatitis.      02/20/2023 patient seen in the ED and at that time described abdominal distention and pain.  He explains it has been going on since his EGD last Thursday as above.  Found to have 2+ pitting edema on the left.  (Cast on the right foot).  AST 71, alk phos 158, total bili 2.3.  Hemoglobin 8.9.  Platelets 109.  Lipase normal.  At that time findings of what may be cellulitis in left lower quadrant.  He was given some IV Lasix  to help with diuresis.    02/20/2023 CT of the abdomen pelvis with cirrhosis and evidence of portal hypertension including splenomegaly, gastroesophageal varices and trace volume ascites as well as generalized hazy appearance of the mesentery.      02/23/2023 patient seen in clinic  and that time having issues with swelling and pain of the left side of his abdomen which is warm to touch.  He was on Lasix  40 mg daily.  Continued on Nadolol  20 mg daily.  Also still on Creon  2 with a meal along with a snack but still having loose stools.  At that point restarted on Pantoprazole  40 twice daily.  Stopped Creon .  Continue Lasix  40 mg daily.  Continued on Nadolol  20 mg daily.    06/18/2023 patient seen at the [facility].  At that time noted his decompensated cirrhosis due to alcohol associated liver disease complicated by ascites, esophageal varices and portal hypertensive gastropathy.    07/06/2023 abdominal ultrasound with Dawn Drezyk at the liver clinic with cirrhotic morphology of the liver and splenomegaly.    09/01/2023 CMP with sodium 145, alk phos 186, AST 83, ALT 42, INR 1.3, PT 14.1, CBC with hemoglobin 11.2.    Today, patient presents to clinic and tells me he continues with diarrhea.  He was seen almost a year ago for the same symptoms with a stool culture and C. difficile negative.  Things remain the same.  He still has about 10-15 watery loose stools every day.  Occasionally if he uses 2 Imodium at once he can slow this down to maybe 5-10, but it is still watery and urgent.  This is life inhibiting.  He would like to figure out what is  going on.  Not currently on any lactulose.  Continues to follow with the [facility] for his decompensated cirrhosis.    Denies fever, chills, weight loss or blood in the stool.  Past Medical History:  Diagnosis Date   Acute conjunctivitis of both eyes 10/22/2021   Acute sinusitis 06/16/2013   Alcoholic cirrhosis (HCC)    Alcoholic fatty liver 01/16/2010   Needs final HBV and HAV vaccines on or after 10/25/2012    Alcoholism (HCC) 12/25/2011   Allergic rhinitis    Childhood asthma    Elevated transaminase level 06/10/2007   AST: 80 ALT: 136 in 8/11: Hepatitis A., B and C negative.    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    Sleep apnea     Past Surgical History:  Procedure Laterality Date   ESOPHAGEAL BANDING  12/26/2022   Procedure: ESOPHAGEAL BANDING;  Surgeon: Sergio Dandy, MD;  Location: [facility];  Service: Gastroenterology;;   ESOPHAGOGASTRODUODENOSCOPY (EGD) WITH PROPOFOL  N/A 07/05/2022   Procedure: ESOPHAGOGASTRODUODENOSCOPY (EGD) WITH PROPOFOL ;  Surgeon: Sergio Dandy, MD;  Location: [facility];  Service: Gastroenterology;  Laterality: N/A;   ESOPHAGOGASTRODUODENOSCOPY (EGD) WITH PROPOFOL  N/A 12/26/2022   Procedure: ESOPHAGOGASTRODUODENOSCOPY (EGD) WITH PROPOFOL ;  Surgeon: Sergio Dandy, MD;  Location: [facility];  Service: Gastroenterology;  Laterality: N/A;   ESOPHAGOGASTRODUODENOSCOPY (EGD) WITH PROPOFOL  N/A 02/12/2023   Procedure: ESOPHAGOGASTRODUODENOSCOPY (EGD) WITH PROPOFOL ;  Surgeon: Sergio Dandy, MD;  Location: [facility];  Service: Gastroenterology;  Laterality: N/A;   ORIF ANKLE FRACTURE Right 12/28/2022   Procedure: OPEN REDUCTION INTERNAL FIXATION (ORIF) ANKLE FRACTURE;  Surgeon: Osa Blase, MD;  Location: [facility];  Service: Orthopedics;  Laterality: Right;    Current Outpatient Medications  Medication Sig Dispense Refill   acetaminophen  (TYLENOL ) 325 MG tablet Take 1-2 tablets (325-650 mg total) by mouth every 4 (four) hours as needed for mild pain. (Patient not taking: Reported on 05/06/2023)     albuterol  (VENTOLIN  HFA) 108 (90 Base) MCG/ACT inhaler Inhale 2 puffs into the lungs every 6 (six) hours as needed for wheezing or shortness of breath. 8 g 0   amoxicillin -clavulanate (AUGMENTIN ) 875-125 MG tablet Take 1 tablet by mouth 2 (two) times daily. 20 tablet 0   benzonatate  (TESSALON ) 200 MG capsule Take 1 capsule (200 mg total) by mouth 2 (two) times daily as needed  for cough. 20 capsule 0   cyclobenzaprine  (FLEXERIL ) 5 MG tablet Take 1 tablet (5 mg total) by mouth 3 (three) times daily as needed for muscle spasms. (Patient not taking: Reported on 05/06/2023) 90 tablet 0   diclofenac  Sodium (VOLTAREN ) 1 % GEL Apply 2 g topically 4 (four) times daily. (Patient not taking: Reported on 05/06/2023) 350 g 0   ferrous sulfate  325 (65 FE) MG tablet Take 1 tablet (325 mg total) by mouth daily with breakfast. (Patient not taking: Reported on 05/06/2023) 30 tablet 3   furosemide  (LASIX ) 40 MG tablet Take 1 tablet (40 mg total) by mouth daily. 30 tablet 5   lipase/protease/amylase (CREON ) 36000 UNITS CPEP capsule Take 2 capsules (72,000 Units total) by mouth 3 (three) times daily with meals. May also take 1 capsule (36,000 Units total) as needed (with snacks). (Patient not taking: Reported on 03/10/2023) 240 capsule 11   nadolol  (CORGARD ) 20 MG tablet  Take 1 tablet (20 mg total) by mouth daily. 30 tablet 11   pantoprazole  (PROTONIX ) 40 MG tablet TAKE 1 TABLET BY MOUTH TWICE A DAY 180 tablet 0   potassium chloride  SA (KLOR-CON  M) 20 MEQ tablet Take 1 tablet (20 mEq total) by mouth daily. (Patient not taking: Reported on 03/10/2023) 30 tablet 0   No current facility-administered medications for this visit.    Allergies as of 11/06/2023 - Review Complete 05/14/2023  Allergen Reaction Noted   Codeine Other (See Comments) 02/13/2015    Family History  Problem Relation Age of Onset   Liver disease Mother    Depression Mother    Liver disease Father    Diabetes Father    Hypertension Father    Liver disease Sister    Diabetes Other    Colon cancer Neg Hx    Esophageal cancer Neg Hx    Stomach cancer Neg Hx    Colon polyps Neg Hx     Social History   Socioeconomic History   Marital status: Single    Spouse name: Not on file   Number of children: 0   Years of education: Not on file   Highest education level: 12th grade  Occupational History   Not on file   Tobacco Use   Smoking status: Some Days    Types: Cigars   Smokeless tobacco: Never   Tobacco comments:    Cigars occasional  Vaping Use   Vaping status: Never Used  Substance and Sexual Activity   Alcohol use: Not Currently    Comment: Beer/wine 1 - 3 times a week (drinks heavily on weekends)   Drug use: Never    Comment: history of cocaine abuse   Sexual activity: Yes  Other Topics Concern   Not on file  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             Social Drivers of Corporate investment banker Strain: Low Risk  (03/09/2023)   Overall Financial Resource Strain (CARDIA)    Difficulty of Paying Living Expenses: Not hard at all  Food Insecurity: Low Risk  (06/18/2023)   Received from [facility]   Hunger Vital Sign    Worried About Running Out of Food in the Last Year: Never true    Ran Out of Food in the Last Year: Never true  Transportation Needs: No Transportation Needs (06/18/2023)   Received from [facility]    In the past 12 months, has lack of reliable transportation kept you from medical appointments, meetings, work or from getting things needed for daily living? : No  Physical Activity: Insufficiently Active (03/09/2023)   Exercise Vital Sign    Days of Exercise per Week: 2 days    Minutes of Exercise per Session: 10 min  Stress: No Stress Concern Present (03/09/2023)   [employer] of Occupational Health - Occupational Stress Questionnaire    Feeling of Stress : Only a little  Social Connections: Socially Isolated (03/09/2023)   Social Connection and Isolation Panel [NHANES]    Frequency of Communication with Friends and Family: More than three times a week    Frequency of Social Gatherings with Friends and Family: More than three times a week    Attends Religious Services: Never    Database administrator or Organizations: No    Attends Engineer, structural: Not on file    Marital  Status: Never married  Intimate Partner Violence: Not At Risk (12/29/2022)   Humiliation, Afraid, Rape, and Kick questionnaire    Fear of Current or Ex-Partner: No    Emotionally Abused: No    Physically Abused: No    Sexually Abused: No    Review of Systems:    Constitutional: No weight loss, fever or chills Cardiovascular: No chest pain Respiratory: No SOB Gastrointestinal: See HPI and otherwise negative   Physical Exam:  Vital signs: BP 136/62 (BP Location: Left Arm, Patient Position: Sitting, Cuff Size: Large)   Pulse 91   Ht 5\' 5"  (1.651 m)   Wt 270 lb 4 oz (122.6 kg)   BMI 44.97 kg/m    Constitutional:   Pleasant Hispanic male appears to be in NAD, Well developed, Well nourished, alert and cooperative Respiratory: Respirations even and unlabored. Lungs clear to auscultation bilaterally.   No wheezes, crackles, or rhonchi.  Cardiovascular: Normal S1, S2. No MRG. Regular rate and rhythm. No peripheral edema, cyanosis or pallor.  Gastrointestinal:  Soft, nondistended, nontender. No rebound or guarding. Normal bowel sounds. No appreciable masses or hepatomegaly. Rectal:  Not performed.  Psychiatric: Demonstrates good judgement and reason without abnormal affect or behaviors.  RELEVANT LABS AND IMAGING: CBC    Component Value Date/Time   WBC 3.8 (L) 02/20/2023 1519   RBC 3.75 (L) 02/20/2023 1519   HGB 8.9 (L) 02/20/2023 1519   HCT 30.3 (L) 02/20/2023 1519   PLT 109 (L) 02/20/2023 1519   MCV 80.8 02/20/2023 1519   MCH 23.7 (L) 02/20/2023 1519   MCHC 29.4 (L) 02/20/2023 1519   RDW 17.2 (H) 02/20/2023 1519   LYMPHSABS 0.7 02/03/2023 1543   MONOABS 0.4 02/03/2023 1543   EOSABS 0.6 02/03/2023 1543   BASOSABS 0.0 02/03/2023 1543    CMP     Component Value Date/Time   NA 138 02/20/2023 1519   K 4.0 02/20/2023 1519   CL 106 02/20/2023 1519   CO2 27 02/20/2023 1519   GLUCOSE 92 02/20/2023 1519   BUN 9 02/20/2023 1519   CREATININE 0.55 (L) 02/20/2023 1519    CREATININE 0.66 06/14/2021 1419   CALCIUM 8.4 (L) 02/20/2023 1519   PROT 6.3 (L) 02/20/2023 1519   ALBUMIN  2.7 (L) 02/20/2023 1519   AST 71 (H) 02/20/2023 1519   ALT 41 02/20/2023 1519   ALKPHOS 158 (H) 02/20/2023 1519   BILITOT 2.3 (H) 02/20/2023 1519   GFRNONAA >60 02/20/2023 1519   GFRNONAA >89 06/22/2013 0958   GFRAA >89 06/22/2013 0958    Assessment: 1.  Chronic diarrhea: For the past year, 10-15 watery stools, initially stool culture and C. difficile negative in August 2024; consider relation to cirrhosis +/- infectious or inflammatory cause 2.  Decompensated alcoholic cirrhosis: Follows with [facility]  Plan: 1.  Ordered repeat stool studies to include a GI path panel, fecal calprotectin, fecal lactoferrin and O&P. 2.  Start the patient on Imodium 1 tab every 6 hours #30 with 5 refills 3.  If stool studies are negative may need to consider repeat colonoscopy for biopsies given longstanding diarrhea.  4.  Patient to follow in clinic per recommendations after stool studies.  Thomas Capra, PA-C [facility] 11/06/2023, 2:58 PM  Cc: Tonna Frederic

## 2023-11-06 NOTE — Patient Instructions (Signed)
 _______________________________________________________  If your blood pressure at your visit was 140/90 or greater, please contact your primary care physician to follow up on this.  _______________________________________________________  If you are age 46 or older, your body mass index should be between 23-30. Your Body mass index is 44.97 kg/m. If this is out of the aforementioned range listed, please consider follow up with your Primary Care Provider.  If you are age 82 or younger, your body mass index should be between 19-25. Your Body mass index is 44.97 kg/m. If this is out of the aformentioned range listed, please consider follow up with your Primary Care Provider.   ________________________________________________________  The [facility] providers would like to encourage you to use MYCHART to communicate with providers for non-urgent requests or questions.  Due to long hold times on the telephone, sending your provider a message by [facility] may be a faster and more efficient way to get a response.  Please allow 48 business hours for a response.  Please remember that this is for non-urgent requests.  _______________________________________________________  Your provider has requested that you go to the basement level for lab work before leaving today. Press "B" on the elevator. The lab is located at the first door on the left as you exit the elevator.  We have sent the following medications to your pharmacy for you to pick up at your convenience:   It was a pleasure to see you today!  Thank you for trusting me with your gastrointestinal care!

## 2023-11-25 ENCOUNTER — Telehealth: Payer: Self-pay

## 2023-11-25 ENCOUNTER — Other Ambulatory Visit: Payer: Self-pay | Admitting: Family Medicine

## 2023-11-25 DIAGNOSIS — K7030 Alcoholic cirrhosis of liver without ascites: Secondary | ICD-10-CM

## 2023-11-25 NOTE — Telephone Encounter (Signed)
 Copied from CRM [id number]. Topic: Clinical - Lab/Test Results >> Nov 25, 2023 11:58 AM Juleen Oakland F wrote: Patient said that Dr. Tilmon Font wants him to get some vaccines done but he forgot what vaccines he needs. Please call patient at [phone].  Patient has an appointment tomorrow with Dr Tilmon Font and can talk to him then about getting the Prevnar 20. Dm/cma

## 2023-11-26 ENCOUNTER — Ambulatory Visit (INDEPENDENT_AMBULATORY_CARE_PROVIDER_SITE_OTHER): Admitting: Family Medicine

## 2023-11-26 ENCOUNTER — Other Ambulatory Visit: Payer: Self-pay | Admitting: Family Medicine

## 2023-11-26 VITALS — BP 118/72 | HR 70 | Temp 96.0°F | Ht 65.0 in | Wt 276.0 lb

## 2023-11-26 DIAGNOSIS — N6342 Unspecified lump in left breast, subareolar: Secondary | ICD-10-CM | POA: Diagnosis not present

## 2023-11-26 DIAGNOSIS — L409 Psoriasis, unspecified: Secondary | ICD-10-CM

## 2023-11-26 NOTE — Progress Notes (Addendum)
 Established Patient Office Visit   Subjective:  Patient ID: Thomas [name], male    DOB: Jan 13, 1976  Age: 48 y.o. MRN: [id number]  Chief Complaint  Patient presents with   Breast Mass    Noticed 2 weeks ago-Left breast pain only if touched    Referral    Wants referral to Dermatology for Psoriasis     HPI Encounter Diagnoses  Name Primary?   Subareolar mass of left breast Yes   Psoriasis    Evaluation of a 2-week history of a tender mass in his left breast.  Has been sober now for 8 months.   ROS   Current Outpatient Medications:    acetaminophen  (TYLENOL ) 325 MG tablet, Take 1-2 tablets (325-650 mg total) by mouth every 4 (four) hours as needed for mild pain., Disp: , Rfl:    albuterol  (VENTOLIN  HFA) 108 (90 Base) MCG/ACT inhaler, Inhale 2 puffs into the lungs every 6 (six) hours as needed for wheezing or shortness of breath., Disp: 8 g, Rfl: 0   Cyanocobalamin 1000 MCG CAPS, Take 1 tablet by mouth daily., Disp: , Rfl:    diphenoxylate -atropine  (LOMOTIL ) 2.5-0.025 MG tablet, Take 1 tablet by mouth every 6 (six) hours as needed for diarrhea or loose stools., Disp: 30 tablet, Rfl: 5   folic acid  (FOLVITE ) 1 MG tablet, Take 1 mg by mouth daily., Disp: , Rfl:    Thiamine HCl (VITAMIN B-1) 250 MG tablet, Take 1 tablet by mouth daily., Disp: , Rfl:    cyclobenzaprine  (FLEXERIL ) 5 MG tablet, Take 1 tablet (5 mg total) by mouth 3 (three) times daily as needed for muscle spasms. (Patient not taking: Reported on 11/26/2023), Disp: 90 tablet, Rfl: 0   diclofenac  Sodium (VOLTAREN ) 1 % GEL, Apply 2 g topically 4 (four) times daily. (Patient not taking: Reported on 11/26/2023), Disp: 350 g, Rfl: 0   ferrous sulfate  325 (65 FE) MG tablet, Take 1 tablet (325 mg total) by mouth daily with breakfast. (Patient not taking: Reported on 11/26/2023), Disp: 30 tablet, Rfl: 3   furosemide  (LASIX ) 40 MG tablet, Take 1 tablet (40 mg total) by mouth daily. (Patient not taking: Reported on 11/26/2023), Disp: 30  tablet, Rfl: 5   lipase/protease/amylase (CREON ) 36000 UNITS CPEP capsule, Take 2 capsules (72,000 Units total) by mouth 3 (three) times daily with meals. May also take 1 capsule (36,000 Units total) as needed (with snacks). (Patient not taking: No sig reported), Disp: 240 capsule, Rfl: 11   nadolol  (CORGARD ) 20 MG tablet, Take 1 tablet (20 mg total) by mouth daily. (Patient not taking: Reported on 11/26/2023), Disp: 30 tablet, Rfl: 11   pantoprazole  (PROTONIX ) 40 MG tablet, TAKE 1 TABLET BY MOUTH TWICE A DAY (Patient not taking: Reported on 11/26/2023), Disp: 180 tablet, Rfl: 0   potassium chloride  SA (KLOR-CON  M) 20 MEQ tablet, Take 1 tablet (20 mEq total) by mouth daily. (Patient not taking: Reported on 11/26/2023), Disp: 30 tablet, Rfl: 0   Objective:     BP 118/72 (BP Location: Left Arm, Patient Position: Sitting, Cuff Size: Large)   Pulse 70   Temp (!) 96 F (35.6 C) (Temporal)   Ht 5' 5 (1.651 m)   Wt 276 lb (125.2 kg)   SpO2 96%   BMI 45.93 kg/m  Wt Readings from Last 3 Encounters:  11/26/23 276 lb (125.2 kg)  11/06/23 270 lb 4 oz (122.6 kg)  05/14/23 279 lb 9.6 oz (126.8 kg)      Physical Exam Constitutional:  General: He is not in acute distress.    Appearance: Normal appearance. He is not ill-appearing, toxic-appearing or diaphoretic.  HENT:     Head: Normocephalic and atraumatic.     Right Ear: External ear normal.     Left Ear: External ear normal.     Mouth/Throat:     Mouth: Mucous membranes are moist.     Pharynx: Oropharynx is clear. No oropharyngeal exudate or posterior oropharyngeal erythema.   Eyes:     General: No scleral icterus.       Right eye: No discharge.        Left eye: No discharge.     Extraocular Movements: Extraocular movements intact.     Conjunctiva/sclera: Conjunctivae normal.     Pupils: Pupils are equal, round, and reactive to light.    Cardiovascular:     Rate and Rhythm: Normal rate and regular rhythm.  Pulmonary:     Effort:  Pulmonary effort is normal. No respiratory distress.     Breath sounds: Normal breath sounds. No wheezing or rales.  Chest:   Abdominal:     General: Bowel sounds are normal.   Musculoskeletal:     Cervical back: No rigidity or tenderness.   Skin:    General: Skin is warm and dry.   Neurological:     Mental Status: He is alert and oriented to person, place, and time.   Psychiatric:        Mood and Affect: Mood normal.        Behavior: Behavior normal.      No results found for any visits on 11/26/23.    The 10-year ASCVD risk score (Arnett DK, et al., 2019) is: 3.2%    Assessment & Plan:   Subareolar mass of left breast -     MM Digital Diagnostic Unilat L; Future  Psoriasis -     Ambulatory referral to Dermatology    Return in about 3 months (around 02/26/2024) for annual physical.    Elsie Sim Lent, MD   7/22 addendum: Patient was given appointment with dermatology and canceled it.

## 2023-12-01 ENCOUNTER — Encounter: Payer: Self-pay | Admitting: Family Medicine

## 2023-12-01 DIAGNOSIS — L409 Psoriasis, unspecified: Secondary | ICD-10-CM

## 2023-12-07 ENCOUNTER — Ambulatory Visit
Admission: RE | Admit: 2023-12-07 | Discharge: 2023-12-07 | Disposition: A | Discharge status: Home / Self Care | Source: Ambulatory Visit | Attending: Family Medicine | Admitting: Family Medicine

## 2023-12-07 ENCOUNTER — Ambulatory Visit

## 2023-12-07 DIAGNOSIS — N62 Hypertrophy of breast: Secondary | ICD-10-CM | POA: Diagnosis not present

## 2023-12-07 DIAGNOSIS — N6342 Unspecified lump in left breast, subareolar: Secondary | ICD-10-CM

## 2023-12-23 ENCOUNTER — Encounter: Payer: Self-pay | Admitting: Family Medicine

## 2023-12-24 ENCOUNTER — Other Ambulatory Visit: Payer: Self-pay

## 2023-12-24 MED ORDER — PANTOPRAZOLE SODIUM 40 MG PO TBEC: 40.0000 mg | DELAYED_RELEASE_TABLET | Freq: Two times a day (BID) | ORAL | 0 refills | Status: DC

## 2024-02-01 DIAGNOSIS — H0288A Meibomian gland dysfunction right eye, upper and lower eyelids: Secondary | ICD-10-CM | POA: Diagnosis not present

## 2024-02-01 DIAGNOSIS — H02105 Unspecified ectropion of left lower eyelid: Secondary | ICD-10-CM | POA: Diagnosis not present

## 2024-02-01 DIAGNOSIS — H0288B Meibomian gland dysfunction left eye, upper and lower eyelids: Secondary | ICD-10-CM | POA: Diagnosis not present

## 2024-02-01 DIAGNOSIS — H02102 Unspecified ectropion of right lower eyelid: Secondary | ICD-10-CM | POA: Diagnosis not present

## 2024-02-05 ENCOUNTER — Ambulatory Visit
Admission: RE | Admit: 2024-02-05 | Discharge: 2024-02-05 | Disposition: A | Discharge status: Home / Self Care | Source: Ambulatory Visit | Attending: Family Medicine | Admitting: Family Medicine

## 2024-02-05 ENCOUNTER — Ambulatory Visit (INDEPENDENT_AMBULATORY_CARE_PROVIDER_SITE_OTHER): Admitting: Radiology

## 2024-02-05 VITALS — BP 125/81 | HR 70 | Temp 98.1°F | Resp 16

## 2024-02-05 DIAGNOSIS — M25571 Pain in right ankle and joints of right foot: Secondary | ICD-10-CM | POA: Diagnosis not present

## 2024-02-05 MED ORDER — METHYLPREDNISOLONE SODIUM SUCC 125 MG IJ SOLR
60.0000 mg | Freq: Once | INTRAMUSCULAR | Status: AC
  Administered 2024-02-05: 60 mg via INTRAMUSCULAR

## 2024-02-05 MED ORDER — METHYLPREDNISOLONE 4 MG PO TBPK
ORAL_TABLET | ORAL | 0 refills | Status: DC
Start: 2024-02-05 — End: 2024-02-21

## 2024-02-05 NOTE — Discharge Instructions (Signed)
 You were seen today for ankle pain.  Your xray appears normal today.  I have given you a shot of steroid today, and sent a steroid pack to your pharmacy.  Please start this tomorrow.  I have ordered blood work which will be resulted later today.  If there is anything concerning you will be notified.  If you have worsening pain, or start to have fevers/chills then please go to the ER for further evaluation.

## 2024-02-05 NOTE — ED Provider Notes (Signed)
 [facility]    CSN: [id number] Arrival date & time: 02/05/24  1126      History   Chief Complaint Chief Complaint  Patient presents with   Ankle Pain    Xray requested - Entered by patient    HPI Thomas [name] is a 48 y.o. male.    Ankle Pain Patient is here for right ankle pain  He broke this ankle last year, had surgery and has hardware in place.  He was doing well up until several weeks ago.  His father was in the hospital, was sitting and not eating well.  He noted pain and swelling in his ankles.  He took lasix  as prescribed and the swelling improved, but still has pain to the right ankle.  No known injury.  He has pain with walking and movement of the ankle.  He states it feels warm.  No h/o gout.  He does have bad psoriasis.        Past Medical History:  Diagnosis Date   Acute conjunctivitis of both eyes 10/22/2021   Acute sinusitis 06/16/2013   Alcoholic cirrhosis (HCC)    Alcoholic fatty liver 01/16/2010   Needs final HBV and HAV vaccines on or after 10/25/2012    Alcoholism (HCC) 12/25/2011   Allergic rhinitis    Childhood asthma    Elevated transaminase level 06/10/2007   AST: 80 ALT: 136 in 8/11: Hepatitis A., B and C negative.    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    Sleep apnea     Patient Active Problem List   Diagnosis Date Noted   Cellulitis of abdominal wall 03/10/2023   Esophageal varices without bleeding (HCC) 02/13/2023   History of ETOH abuse 01/09/2023   Ankle fracture 01/07/2023   Localized edema 01/07/2023   Closed right ankle fracture 01/06/2023   Thrombocytopenia (HCC) 01/06/2023   Cirrhosis of liver (HCC) 12/25/2022   Secondary esophageal varices with bleeding (HCC) 12/25/2022   Reactive airway disease 07/28/2022   Acute  upper GI bleed 07/05/2022   Hematemesis with nausea 07/05/2022   Melena 07/05/2022   Pharyngitis 10/22/2021   Psoriatic arthritis (HCC) 10/03/2021   Viral upper respiratory tract infection 10/03/2021   Acute blood loss anemia 08/26/2021   Screening for tuberculosis 08/15/2021   Elevated BP without diagnosis of hypertension 06/14/2021   Need for immunization against influenza 06/14/2021   Esophageal varices in alcoholic cirrhosis (HCC) 09/26/2019   Class 3 severe obesity due to excess calories with body mass index (BMI) of 50.0 to 59.9 in adult 09/26/2019   Healthcare maintenance 02/07/2019   Psoriasis 02/07/2019   Vitamin D  deficiency 05/07/2016   OSA (obstructive sleep apnea) 06/23/2013   Insomnia 06/16/2013   Alcohol use disorder 12/25/2011   Scrotal varices, 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   Alcoholic fatty liver 01/16/2010   Allergic rhinitis 10/12/2007    Past Surgical History:  Procedure Laterality Date   ESOPHAGEAL BANDING  12/26/2022   Procedure: ESOPHAGEAL BANDING;  Surgeon: Shila Gustav GAILS, MD;  Location: [facility];  Service: Gastroenterology;;   ESOPHAGOGASTRODUODENOSCOPY (EGD) WITH PROPOFOL  N/A 07/05/2022   Procedure: ESOPHAGOGASTRODUODENOSCOPY (EGD) WITH PROPOFOL ;  Surgeon: Shila Gustav GAILS, MD;  Location: [facility];  Service: Gastroenterology;  Laterality: N/A;   ESOPHAGOGASTRODUODENOSCOPY (EGD) WITH PROPOFOL  N/A 12/26/2022   Procedure: ESOPHAGOGASTRODUODENOSCOPY (  EGD) WITH PROPOFOL ;  Surgeon: Shila Gustav GAILS, MD;  Location: [facility] ENDOSCOPY;  Service: Gastroenterology;  Laterality: N/A;   ESOPHAGOGASTRODUODENOSCOPY (EGD) WITH PROPOFOL  N/A 02/12/2023   Procedure: ESOPHAGOGASTRODUODENOSCOPY (EGD) WITH PROPOFOL ;  Surgeon: Shila Gustav GAILS, MD;  Location: [facility] ENDOSCOPY;  Service: Gastroenterology;  Laterality: N/A;   ORIF ANKLE FRACTURE Right 12/28/2022   Procedure: OPEN REDUCTION INTERNAL FIXATION (ORIF) ANKLE FRACTURE;  Surgeon: Josefina Chew, MD;  Location: [facility] OR;  Service:  Orthopedics;  Laterality: Right;       Home Medications    Prior to Admission medications   Medication Sig Start Date End Date Taking? Authorizing Provider  acetaminophen  (TYLENOL ) 325 MG tablet Take 1-2 tablets (325-650 mg total) by mouth every 4 (four) hours as needed for mild pain. 01/08/23   Love, Sharlet RAMAN, PA-C  albuterol  (VENTOLIN  HFA) 108 (90 Base) MCG/ACT inhaler Inhale 2 puffs into the lungs every 6 (six) hours as needed for wheezing or shortness of breath. 05/06/23   Sebastian Beverley NOVAK, MD  Cyanocobalamin 1000 MCG CAPS Take 1 tablet by mouth daily. 06/19/23 06/18/24  [provider]  cyclobenzaprine  (FLEXERIL ) 5 MG tablet Take 1 tablet (5 mg total) by mouth 3 (three) times daily as needed for muscle spasms. Patient not taking: Reported on 11/26/2023 01/12/23   Maurice Sharlet RAMAN, PA-C  diclofenac  Sodium (VOLTAREN ) 1 % GEL Apply 2 g topically 4 (four) times daily. Patient not taking: Reported on 11/26/2023 01/12/23   Maurice Sharlet RAMAN, PA-C  diphenoxylate -atropine  (LOMOTIL ) 2.5-0.025 MG tablet Take 1 tablet by mouth every 6 (six) hours as needed for diarrhea or loose stools. 11/06/23   Beather Delon Gibson, PA  ferrous sulfate  325 (65 FE) MG tablet Take 1 tablet (325 mg total) by mouth daily with breakfast. Patient not taking: Reported on 11/26/2023 01/12/23   Love, Sharlet RAMAN, PA-C  folic acid  (FOLVITE ) 1 MG tablet Take 1 mg by mouth daily. 11/25/23   [provider]  furosemide  (LASIX ) 40 MG tablet Take 1 tablet (40 mg total) by mouth daily. Patient not taking: Reported on 11/26/2023 03/11/23   Beather Delon Gibson, PA  lipase/protease/amylase (CREON ) 36000 UNITS CPEP capsule Take 2 capsules (72,000 Units total) by mouth 3 (three) times daily with meals. May also take 1 capsule (36,000 Units total) as needed (with snacks). Patient not taking: No sig reported 02/12/23   Nandigam, Kavitha V, MD  nadolol  (CORGARD ) 20 MG tablet Take 1 tablet (20 mg total) by mouth daily. Patient not taking:  Reported on 11/26/2023 03/11/23 03/10/24  Beather Delon Gibson, PA  pantoprazole  (PROTONIX ) 40 MG tablet Take 1 tablet (40 mg total) by mouth 2 (two) times daily. 12/24/23   Berneta Elsie Sayre, MD  potassium chloride  SA (KLOR-CON  M) 20 MEQ tablet Take 1 tablet (20 mEq total) by mouth daily. Patient not taking: Reported on 11/26/2023 01/13/23   Maurice Sharlet RAMAN, PA-C  Thiamine HCl (VITAMIN B-1) 250 MG tablet Take 1 tablet by mouth daily. 06/19/23 06/18/24  [provider]    Family History Family History  Problem Relation Age of Onset   Liver disease Mother    Depression Mother    Liver disease Father    Diabetes Father    Hypertension Father    Liver disease Sister    Diabetes Other    Colon cancer Neg Hx    Esophageal cancer Neg Hx    Stomach cancer Neg Hx    Colon polyps Neg Hx     Social History Social History  Tobacco Use   Smoking status: Some Days    Types: Cigars   Smokeless tobacco: Never   Tobacco comments:    Cigars occasional  Vaping Use   Vaping status: Never Used  Substance Use Topics   Alcohol use: Not Currently    Comment: Sober since November 2024   Drug use: Never    Comment: history of cocaine abuse     Allergies   Codeine   Review of Systems Review of Systems  Constitutional: Negative.   HENT: Negative.    Respiratory: Negative.    Cardiovascular: Negative.   Gastrointestinal: Negative.   Musculoskeletal:  Positive for arthralgias.     Physical Exam Triage Vital Signs ED Triage Vitals  Encounter Vitals Group     BP 02/05/24 1138 125/81     Girls Systolic BP Percentile --      Girls Diastolic BP Percentile --      Boys Systolic BP Percentile --      Boys Diastolic BP Percentile --      Pulse Rate 02/05/24 1138 70     Resp 02/05/24 1138 16     Temp 02/05/24 1138 98.1 F (36.7 C)     Temp Source 02/05/24 1138 Oral     SpO2 02/05/24 1138 97 %     Weight --      Height --      Head Circumference --      Peak Flow --       Pain Score 02/05/24 1141 10     Pain Loc --      Pain Education --      Exclude from Growth Chart --    No data found.  Updated Vital Signs BP 125/81 (BP Location: Right Arm)   Pulse 70   Temp 98.1 F (36.7 C) (Oral)   Resp 16   SpO2 97%   Visual Acuity Right Eye Distance:   Left Eye Distance:   Bilateral Distance:    Right Eye Near:   Left Eye Near:    Bilateral Near:     Physical Exam Constitutional:      Appearance: Normal appearance. He is obese.  Musculoskeletal:     Comments: Slight edema to the right ankle;  no TTP to the ankle itself.  There is warmth to the ankle;  He has pain to the anterior ankle joint with movement of the ankle;   pain with bearing weight  Skin:    Comments: Psoriasis noted to the lower legs  Neurological:     General: No focal deficit present.     Mental Status: He is alert.  Psychiatric:        Mood and Affect: Mood normal.      UC Treatments / Results  Labs (all labs ordered are listed, but only abnormal results are displayed) Labs Reviewed  CBC WITH DIFFERENTIAL/PLATELET  URIC ACID    EKG   Radiology DG Ankle Complete Right Result Date: 02/05/2024 CLINICAL DATA:  Right ankle pain. EXAM: RIGHT ANKLE - COMPLETE 3+ VIEW COMPARISON:  January 10, 2023. FINDINGS: Status post surgical internal fixation of old distal right tibial and fibular fractures. No acute fracture or dislocation is noted. IMPRESSION: No acute abnormality seen. Electronically Signed   By: Lynwood Landy Raddle M.D.   On: 02/05/2024 12:09    Procedures Procedures (including critical care time)  Medications Ordered in UC Medications  methylPREDNISolone  sodium succinate (SOLU-MEDROL ) 125 mg/2 mL injection 60 mg (has no administration in time  range)    Initial Impression / Assessment and Plan / UC Course  I have reviewed the triage vital signs and the nursing notes.  Pertinent labs & imaging results that were available during my care of the patient were reviewed by  me and considered in my medical decision making (see chart for details).      Final Clinical Impressions(s) / UC Diagnoses   Final diagnoses:  Acute right ankle pain     Discharge Instructions      You were seen today for ankle pain.  Your xray appears normal today.  I have given you a shot of steroid today, and sent a steroid pack to your pharmacy.  Please start this tomorrow.  I have ordered blood work which will be resulted later today.  If there is anything concerning you will be notified.  If you have worsening pain, or start to have fevers/chills then please go to the ER for further evaluation.      ED Prescriptions     Medication Sig Dispense Auth. Provider   methylPREDNISolone  (MEDROL  DOSEPAK) 4 MG TBPK tablet Take as directed 1 each Darral Longs, MD      PDMP not reviewed this encounter.   Darral Longs, MD 02/05/24 [phone]

## 2024-02-05 NOTE — ED Triage Notes (Addendum)
 Pt states he broke right ankle a few years ago and since then has not had any problems. Pt developed swelling and pain last week in right ankle. Denies injuring ankle last week.

## 2024-02-06 LAB — CBC WITH DIFFERENTIAL/PLATELET
Basophils Absolute: 0 x10E3/uL (ref 0.0–0.2)
Basos: 1 %
EOS (ABSOLUTE): 0.4 x10E3/uL (ref 0.0–0.4)
Eos: 10 %
Hematocrit: 36.5 % — ABNORMAL LOW (ref 37.5–51.0)
Hemoglobin: 12.6 g/dL — ABNORMAL LOW (ref 13.0–17.7)
Immature Grans (Abs): 0 x10E3/uL (ref 0.0–0.1)
Immature Granulocytes: 0 %
Lymphocytes Absolute: 0.6 x10E3/uL — ABNORMAL LOW (ref 0.7–3.1)
Lymphs: 15 %
MCH: 31.9 pg (ref 26.6–33.0)
MCHC: 34.5 g/dL (ref 31.5–35.7)
MCV: 92 fL (ref 79–97)
Monocytes Absolute: 0.4 x10E3/uL (ref 0.1–0.9)
Monocytes: 10 %
Neutrophils Absolute: 2.5 x10E3/uL (ref 1.4–7.0)
Neutrophils: 64 %
Platelets: 111 x10E3/uL — ABNORMAL LOW (ref 150–450)
RBC: 3.95 x10E6/uL — ABNORMAL LOW (ref 4.14–5.80)
RDW: 14.9 % (ref 11.6–15.4)
WBC: 3.9 x10E3/uL (ref 3.4–10.8)

## 2024-02-06 LAB — URIC ACID: Uric Acid: 3.1 mg/dL — ABNORMAL LOW (ref 3.8–8.4)

## 2024-02-09 ENCOUNTER — Ambulatory Visit (HOSPITAL_COMMUNITY): Payer: Self-pay

## 2024-02-11 ENCOUNTER — Encounter: Payer: Self-pay | Admitting: Family Medicine

## 2024-02-20 ENCOUNTER — Other Ambulatory Visit: Payer: Self-pay

## 2024-02-20 ENCOUNTER — Observation Stay (HOSPITAL_COMMUNITY)
Admission: EM | Admit: 2024-02-20 | Discharge: 2024-02-24 | Disposition: A | Discharge status: Home / Self Care | Attending: Internal Medicine | Admitting: Internal Medicine

## 2024-02-20 ENCOUNTER — Ambulatory Visit: Payer: Self-pay | Admitting: Internal Medicine

## 2024-02-20 ENCOUNTER — Ambulatory Visit (INDEPENDENT_AMBULATORY_CARE_PROVIDER_SITE_OTHER): Admitting: Radiology

## 2024-02-20 ENCOUNTER — Ambulatory Visit
Admission: EM | Admit: 2024-02-20 | Discharge: 2024-02-20 | Disposition: A | Discharge status: Home / Self Care | Attending: Internal Medicine | Admitting: Internal Medicine

## 2024-02-20 ENCOUNTER — Emergency Department (HOSPITAL_COMMUNITY)

## 2024-02-20 ENCOUNTER — Observation Stay (HOSPITAL_COMMUNITY)

## 2024-02-20 ENCOUNTER — Encounter (HOSPITAL_COMMUNITY): Payer: Self-pay | Admitting: *Deleted

## 2024-02-20 ENCOUNTER — Encounter: Payer: Self-pay | Admitting: Emergency Medicine

## 2024-02-20 DIAGNOSIS — K7030 Alcoholic cirrhosis of liver without ascites: Secondary | ICD-10-CM

## 2024-02-20 DIAGNOSIS — R161 Splenomegaly, not elsewhere classified: Secondary | ICD-10-CM | POA: Diagnosis not present

## 2024-02-20 DIAGNOSIS — R0602 Shortness of breath: Secondary | ICD-10-CM

## 2024-02-20 DIAGNOSIS — K7681 Hepatopulmonary syndrome: Secondary | ICD-10-CM | POA: Diagnosis not present

## 2024-02-20 DIAGNOSIS — F1010 Alcohol abuse, uncomplicated: Secondary | ICD-10-CM | POA: Diagnosis not present

## 2024-02-20 DIAGNOSIS — K769 Liver disease, unspecified: Secondary | ICD-10-CM

## 2024-02-20 DIAGNOSIS — M13171 Monoarthritis, not elsewhere classified, right ankle and foot: Secondary | ICD-10-CM | POA: Diagnosis not present

## 2024-02-20 DIAGNOSIS — Z79899 Other long term (current) drug therapy: Secondary | ICD-10-CM | POA: Diagnosis not present

## 2024-02-20 DIAGNOSIS — E66813 Obesity, class 3: Secondary | ICD-10-CM | POA: Diagnosis not present

## 2024-02-20 DIAGNOSIS — L409 Psoriasis, unspecified: Secondary | ICD-10-CM

## 2024-02-20 DIAGNOSIS — J918 Pleural effusion in other conditions classified elsewhere: Secondary | ICD-10-CM | POA: Diagnosis not present

## 2024-02-20 DIAGNOSIS — L4050 Arthropathic psoriasis, unspecified: Secondary | ICD-10-CM | POA: Insufficient documentation

## 2024-02-20 DIAGNOSIS — R609 Edema, unspecified: Secondary | ICD-10-CM | POA: Diagnosis not present

## 2024-02-20 DIAGNOSIS — F17210 Nicotine dependence, cigarettes, uncomplicated: Secondary | ICD-10-CM | POA: Insufficient documentation

## 2024-02-20 DIAGNOSIS — J948 Other specified pleural conditions: Secondary | ICD-10-CM | POA: Diagnosis not present

## 2024-02-20 DIAGNOSIS — Z604 Social exclusion and rejection: Secondary | ICD-10-CM | POA: Insufficient documentation

## 2024-02-20 DIAGNOSIS — K7460 Unspecified cirrhosis of liver: Secondary | ICD-10-CM

## 2024-02-20 DIAGNOSIS — R7401 Elevation of levels of liver transaminase levels: Secondary | ICD-10-CM | POA: Diagnosis not present

## 2024-02-20 DIAGNOSIS — K7031 Alcoholic cirrhosis of liver with ascites: Secondary | ICD-10-CM | POA: Insufficient documentation

## 2024-02-20 DIAGNOSIS — J90 Pleural effusion, not elsewhere classified: Secondary | ICD-10-CM | POA: Diagnosis not present

## 2024-02-20 DIAGNOSIS — D6959 Other secondary thrombocytopenia: Secondary | ICD-10-CM | POA: Insufficient documentation

## 2024-02-20 DIAGNOSIS — K766 Portal hypertension: Secondary | ICD-10-CM | POA: Diagnosis not present

## 2024-02-20 DIAGNOSIS — M25471 Effusion, right ankle: Secondary | ICD-10-CM | POA: Diagnosis not present

## 2024-02-20 DIAGNOSIS — Z6841 Body Mass Index (BMI) 40.0 and over, adult: Secondary | ICD-10-CM | POA: Insufficient documentation

## 2024-02-20 DIAGNOSIS — R188 Other ascites: Secondary | ICD-10-CM | POA: Diagnosis not present

## 2024-02-20 DIAGNOSIS — R7989 Other specified abnormal findings of blood chemistry: Secondary | ICD-10-CM

## 2024-02-20 DIAGNOSIS — M25571 Pain in right ankle and joints of right foot: Secondary | ICD-10-CM | POA: Diagnosis not present

## 2024-02-20 DIAGNOSIS — I8510 Secondary esophageal varices without bleeding: Secondary | ICD-10-CM | POA: Diagnosis not present

## 2024-02-20 DIAGNOSIS — F1090 Alcohol use, unspecified, uncomplicated: Secondary | ICD-10-CM | POA: Diagnosis present

## 2024-02-20 DIAGNOSIS — M19071 Primary osteoarthritis, right ankle and foot: Secondary | ICD-10-CM | POA: Diagnosis not present

## 2024-02-20 DIAGNOSIS — M79604 Pain in right leg: Secondary | ICD-10-CM | POA: Diagnosis not present

## 2024-02-20 DIAGNOSIS — M7989 Other specified soft tissue disorders: Secondary | ICD-10-CM | POA: Diagnosis not present

## 2024-02-20 DIAGNOSIS — K760 Fatty (change of) liver, not elsewhere classified: Secondary | ICD-10-CM | POA: Diagnosis not present

## 2024-02-20 DIAGNOSIS — R918 Other nonspecific abnormal finding of lung field: Secondary | ICD-10-CM | POA: Diagnosis not present

## 2024-02-20 DIAGNOSIS — J9811 Atelectasis: Secondary | ICD-10-CM | POA: Diagnosis not present

## 2024-02-20 LAB — PROTIME-INR
INR: 1.5 — ABNORMAL HIGH (ref 0.8–1.2)
Prothrombin Time: 18.7 s — ABNORMAL HIGH (ref 11.4–15.2)

## 2024-02-20 LAB — TROPONIN I (HIGH SENSITIVITY)
Troponin I (High Sensitivity): 5 ng/L (ref <18)
Troponin I (High Sensitivity): 6 ng/L (ref <18)

## 2024-02-20 LAB — HEPATIC FUNCTION PANEL
ALT: 37 U/L (ref 0–44)
AST: 68 U/L — ABNORMAL HIGH (ref 15–41)
Albumin: 2.2 g/dL — ABNORMAL LOW (ref 3.5–5.0)
Alkaline Phosphatase: 203 U/L — ABNORMAL HIGH (ref 38–126)
Bilirubin, Direct: 1.3 mg/dL — ABNORMAL HIGH (ref 0.0–0.2)
Indirect Bilirubin: 2.7 mg/dL — ABNORMAL HIGH (ref 0.3–0.9)
Total Bilirubin: 4 mg/dL — ABNORMAL HIGH (ref 0.0–1.2)
Total Protein: 6.3 g/dL — ABNORMAL LOW (ref 6.5–8.1)

## 2024-02-20 LAB — BASIC METABOLIC PANEL WITH GFR
Anion gap: 8 (ref 5–15)
BUN: 9 mg/dL (ref 6–20)
CO2: 25 mmol/L (ref 22–32)
Calcium: 8.3 mg/dL — ABNORMAL LOW (ref 8.9–10.3)
Chloride: 103 mmol/L (ref 98–111)
Creatinine, Ser: 0.65 mg/dL (ref 0.61–1.24)
GFR, Estimated: >60 mL/min (ref >60)
Glucose, Bld: 111 mg/dL — ABNORMAL HIGH (ref 70–99)
Potassium: 4.4 mmol/L (ref 3.5–5.1)
Sodium: 136 mmol/L (ref 135–145)

## 2024-02-20 LAB — CBC
HCT: 39 % (ref 39.0–52.0)
Hemoglobin: 12.7 g/dL — ABNORMAL LOW (ref 13.0–17.0)
MCH: 31.8 pg (ref 26.0–34.0)
MCHC: 32.6 g/dL (ref 30.0–36.0)
MCV: 97.7 fL (ref 80.0–100.0)
Platelets: 136 K/uL — ABNORMAL LOW (ref 150–400)
RBC: 3.99 MIL/uL — ABNORMAL LOW (ref 4.22–5.81)
RDW: 16 % — ABNORMAL HIGH (ref 11.5–15.5)
WBC: 5.1 K/uL (ref 4.0–10.5)
nRBC: 0 % (ref 0.0–0.2)

## 2024-02-20 LAB — BRAIN NATRIURETIC PEPTIDE: B Natriuretic Peptide: 33 pg/mL (ref 0.0–100.0)

## 2024-02-20 MED ORDER — ONDANSETRON HCL 4 MG/2ML IJ SOLN: 4.0000 mg | Freq: Four times a day (QID) | INTRAMUSCULAR | Status: DC | PRN

## 2024-02-20 MED ORDER — DICLOFENAC SODIUM 1 % EX GEL
4.0000 g | Freq: Four times a day (QID) | CUTANEOUS | Status: DC
  Administered 2024-02-21: 4 g via TOPICAL
  Filled 2024-02-20: qty 100

## 2024-02-20 MED ORDER — FUROSEMIDE 10 MG/ML IJ SOLN
40.0000 mg | Freq: Once | INTRAMUSCULAR | Status: AC
  Administered 2024-02-21: 40 mg via INTRAVENOUS
  Filled 2024-02-20: qty 4

## 2024-02-20 MED ORDER — NADOLOL 20 MG PO TABS
20.0000 mg | ORAL_TABLET | Freq: Every day | ORAL | Status: DC
  Administered 2024-02-21 – 2024-02-24 (×4): 20 mg via ORAL
  Filled 2024-02-20 (×4): qty 1

## 2024-02-20 MED ORDER — MORPHINE SULFATE (PF) 4 MG/ML IV SOLN
4.0000 mg | Freq: Once | INTRAVENOUS | Status: AC
  Administered 2024-02-20: 4 mg via INTRAVENOUS
  Filled 2024-02-20: qty 1

## 2024-02-20 MED ORDER — SPIRONOLACTONE 25 MG PO TABS
50.0000 mg | ORAL_TABLET | Freq: Every day | ORAL | Status: DC
  Administered 2024-02-21 – 2024-02-24 (×4): 50 mg via ORAL
  Filled 2024-02-20 (×4): qty 2

## 2024-02-20 MED ORDER — PANTOPRAZOLE SODIUM 40 MG PO TBEC
40.0000 mg | DELAYED_RELEASE_TABLET | Freq: Two times a day (BID) | ORAL | Status: DC
  Administered 2024-02-21 – 2024-02-24 (×8): 40 mg via ORAL
  Filled 2024-02-20 (×7): qty 1

## 2024-02-20 MED ORDER — SODIUM CHLORIDE 0.9% FLUSH
3.0000 mL | Freq: Two times a day (BID) | INTRAVENOUS | Status: DC
Start: 2024-02-21 — End: 2024-02-24
  Administered 2024-02-21 – 2024-02-24 (×7): 3 mL via INTRAVENOUS

## 2024-02-20 MED ORDER — POLYETHYLENE GLYCOL 3350 17 G PO PACK
17.0000 g | PACK | Freq: Every day | ORAL | Status: DC | PRN
  Administered 2024-02-24: 17 g via ORAL
  Filled 2024-02-20: qty 1

## 2024-02-20 MED ORDER — ACETAMINOPHEN 500 MG PO TABS
500.0000 mg | ORAL_TABLET | Freq: Four times a day (QID) | ORAL | Status: DC | PRN
  Administered 2024-02-21 – 2024-02-23 (×2): 500 mg via ORAL
  Filled 2024-02-20 (×4): qty 1

## 2024-02-20 MED ORDER — ALBUTEROL SULFATE (2.5 MG/3ML) 0.083% IN NEBU: 2.5000 mg | INHALATION_SOLUTION | RESPIRATORY_TRACT | Status: DC | PRN

## 2024-02-20 MED ORDER — FUROSEMIDE 40 MG PO TABS
40.0000 mg | ORAL_TABLET | Freq: Every day | ORAL | Status: DC
  Administered 2024-02-22 – 2024-02-24 (×3): 40 mg via ORAL
  Filled 2024-02-20 (×3): qty 1

## 2024-02-20 MED ORDER — FUROSEMIDE 10 MG/ML IJ SOLN
40.0000 mg | Freq: Once | INTRAMUSCULAR | Status: AC
  Administered 2024-02-20: 40 mg via INTRAVENOUS
  Filled 2024-02-20: qty 4

## 2024-02-20 MED ORDER — IOHEXOL 350 MG/ML SOLN
75.0000 mL | Freq: Once | INTRAVENOUS | Status: AC | PRN
  Administered 2024-02-20: 75 mL via INTRAVENOUS

## 2024-02-20 MED ORDER — MELATONIN 3 MG PO TABS: 6.0000 mg | ORAL_TABLET | Freq: Every evening | ORAL | Status: DC | PRN

## 2024-02-20 NOTE — H&P (Incomplete)
 History and Physical    Thomas [name] FMW:[id number] DOB: April 07, 1976 DOA: 02/20/2024  PCP: Berneta Elsie Sayre, MD   Patient coming from: Home   Chief Complaint:  Chief Complaint  Patient presents with  . Shortness of Breath    HPI:  Thomas [name] is a 48 y.o. male with hx of decompensated alcoholic cirrhosis with ascites, EV, splenomegaly, psoriasis, who presented with worsening pain and swelling in R ankle to UC, and referred to the ED due to SOB with large R pleural effusion. Reports that over past month noted worsening bilateral LE swelling and restarted taking lasix  40 mg daily, which improved his bilateral swelling. However, then the R ankle flared about 3 weeks ago. With pain and swelling which is impairing his functional status and ability to ambulate. He is using a cane, and at times has tried to use his dads walker. Received an IM steroid injection which helped calm down symptoms briefly but returned. Otherwise just taking tylenol  for relief.   While mainly been worried about his ankle, has noted over the past week that he was more short of breath, and having DOE. Activity has been limited recently due to ankle. No associated chest pain. No cough / cold or other illness. Has not noted any increased abd swelling. Did have BLE edema as mentioned above but this has improved with lasix . He does drink on social occasions.    Review of Systems:  ROS complete and negative except as marked above   Allergies  Allergen Reactions  . Codeine Other (See Comments)    Jittery     Prior to Admission medications   Medication Sig Start Date End Date Taking? Authorizing Provider  acetaminophen  (TYLENOL ) 325 MG tablet Take 1-2 tablets (325-650 mg total) by mouth every 4 (four) hours as needed for mild pain. 01/08/23   Love, Sharlet GORMAN, PA-C  albuterol  (VENTOLIN  HFA) 108 (90 Base) MCG/ACT inhaler Inhale 2 puffs into the lungs every 6 (six) hours as needed for wheezing or shortness of breath.  05/06/23   Sebastian Beverley NOVAK, MD  Cyanocobalamin 1000 MCG CAPS Take 1 tablet by mouth daily. 06/19/23 06/18/24  [provider]  cyclobenzaprine  (FLEXERIL ) 5 MG tablet Take 1 tablet (5 mg total) by mouth 3 (three) times daily as needed for muscle spasms. Patient not taking: Reported on 11/26/2023 01/12/23   Maurice Sharlet GORMAN, PA-C  diclofenac  Sodium (VOLTAREN ) 1 % GEL Apply 2 g topically 4 (four) times daily. Patient not taking: Reported on 11/26/2023 01/12/23   Maurice Sharlet GORMAN, PA-C  diphenoxylate -atropine  (LOMOTIL ) 2.5-0.025 MG tablet Take 1 tablet by mouth every 6 (six) hours as needed for diarrhea or loose stools. 11/06/23   Beather Delon Gibson, PA  ferrous sulfate  325 (65 FE) MG tablet Take 1 tablet (325 mg total) by mouth daily with breakfast. Patient not taking: Reported on 11/26/2023 01/12/23   Love, Sharlet GORMAN, PA-C  folic acid  (FOLVITE ) 1 MG tablet Take 1 mg by mouth daily. 11/25/23   [provider]  furosemide  (LASIX ) 40 MG tablet Take 1 tablet (40 mg total) by mouth daily. Patient not taking: Reported on 11/26/2023 03/11/23   Beather Delon Gibson, PA  lipase/protease/amylase (CREON ) 36000 UNITS CPEP capsule Take 2 capsules (72,000 Units total) by mouth 3 (three) times daily with meals. May also take 1 capsule (36,000 Units total) as needed (with snacks). Patient not taking: No sig reported 02/12/23   Nandigam, Kavitha V, MD  methylPREDNISolone  (MEDROL  DOSEPAK) 4 MG TBPK tablet Take  as directed Patient not taking: Reported on 02/20/2024 02/05/24   Darral Longs, MD  nadolol  (CORGARD ) 20 MG tablet Take 1 tablet (20 mg total) by mouth daily. Patient not taking: Reported on 11/26/2023 03/11/23 03/10/24  Beather Delon Gibson, PA  pantoprazole  (PROTONIX ) 40 MG tablet Take 1 tablet (40 mg total) by mouth 2 (two) times daily. 12/24/23   Berneta Elsie Sayre, MD  potassium chloride  SA (KLOR-CON  M) 20 MEQ tablet Take 1 tablet (20 mEq total) by mouth daily. Patient not taking: Reported on  11/26/2023 01/13/23   Maurice Sharlet RAMAN, PA-C  Thiamine HCl (VITAMIN B-1) 250 MG tablet Take 1 tablet by mouth daily. 06/19/23 06/18/24  [provider]    Past Medical History:  Diagnosis Date  . Acute conjunctivitis of both eyes 10/22/2021  . Acute sinusitis 06/16/2013  . Alcoholic cirrhosis (HCC)   . Alcoholic fatty liver 01/16/2010   Needs final HBV and HAV vaccines on or after 10/25/2012   . Alcoholism (HCC) 12/25/2011  . Allergic rhinitis   . Childhood asthma   . Elevated transaminase level 06/10/2007   AST: 80 ALT: 136 in 8/11: Hepatitis A., B and C negative.   . Gastroenteritis 08/26/2021  . Hand pain, left 07/28/2022  . Hordeolum externum of right upper eyelid 08/14/2022  . IDA (iron deficiency anemia)   . Morbid obesity (HCC)   . 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   . Sleep apnea     Past Surgical History:  Procedure Laterality Date  . ESOPHAGEAL BANDING  12/26/2022   Procedure: ESOPHAGEAL BANDING;  Surgeon: Shila Gustav GAILS, MD;  Location: [facility];  Service: Gastroenterology;;  . ESOPHAGOGASTRODUODENOSCOPY (EGD) WITH PROPOFOL  N/A 07/05/2022   Procedure: ESOPHAGOGASTRODUODENOSCOPY (EGD) WITH PROPOFOL ;  Surgeon: Shila Gustav GAILS, MD;  Location: [facility];  Service: Gastroenterology;  Laterality: N/A;  . ESOPHAGOGASTRODUODENOSCOPY (EGD) WITH PROPOFOL  N/A 12/26/2022   Procedure: ESOPHAGOGASTRODUODENOSCOPY (EGD) WITH PROPOFOL ;  Surgeon: Shila Gustav GAILS, MD;  Location: [facility];  Service: Gastroenterology;  Laterality: N/A;  . ESOPHAGOGASTRODUODENOSCOPY (EGD) WITH PROPOFOL  N/A 02/12/2023   Procedure: ESOPHAGOGASTRODUODENOSCOPY (EGD) WITH PROPOFOL ;  Surgeon: Shila Gustav GAILS, MD;  Location: [facility];  Service: Gastroenterology;  Laterality: N/A;  . ORIF ANKLE FRACTURE Right 12/28/2022   Procedure: OPEN REDUCTION INTERNAL FIXATION (ORIF) ANKLE FRACTURE;  Surgeon: Josefina Chew, MD;  Location: [facility] OR;  Service: Orthopedics;   Laterality: Right;     reports that he has been smoking cigars. He has never used smokeless tobacco. He reports that he does not currently use alcohol. He reports that he does not use drugs.  Family History  Problem Relation Age of Onset  . Liver disease Mother   . Depression Mother   . Liver disease Father   . Diabetes Father   . Hypertension Father   . Liver disease Sister   . Diabetes Other   . Colon cancer Neg Hx   . Esophageal cancer Neg Hx   . Stomach cancer Neg Hx   . Colon polyps Neg Hx      Physical Exam: Vitals:   02/20/24 1719 02/20/24 1719 02/20/24 1745 02/20/24 1930  BP:  (!) 143/76 120/69 (!) 114/94  Pulse:  69 64 62  Resp:  18  16  Temp:  98 F (36.7 C)    SpO2:  95% 93% 96%  Weight: 125.2 kg     Height: 5' 5 (1.651 m)       Gen: Awake, alert, NAD   CV: Regular, normal  S1, S2, no murmurs  Resp: Normal WOB, diminished in the lower 1/2 posterior fields on the R, referred bronchial sounds  Abd: Obese, normoactive, nontender MSK: R ankle is edematous, without overlying erythema, but is slightly warm compared to contralateral. There are scars from prior ankle fixation. No edema above the ankle on R. No edema on L.  Skin: Psoriatic plaques present  Neuro: Alert and interactive  Psych: euthymic, appropriate    Data review:   Labs reviewed, notable for:   T. bili 4, direct 1.3, AST 68, ALT 37, alk phos 203, albumin  2.2 BNP 33 High-sensitivity Trop negative x 2 WBC 5 Platelet 136 INR 1.5  Micro:  Results for orders placed or performed in visit on 02/05/23  Stool culture     Status: None   Collection Time: 02/05/23  9:19 AM  Result Value Ref Range Status   Salmonella/Shigella Screen Final report  Final   Stool Culture result 1 (RSASHR) Comment  Final    Comment: No Salmonella or Shigella recovered.   Campylobacter Culture Final report  Final   Stool Culture result 1 (CMPCXR) Comment  Final    Comment: No Campylobacter species isolated.   E coli,  Shiga toxin Assay Negative Negative Final    Imaging reviewed:  CT Angio Chest PE W and/or Wo Contrast Result Date: 02/20/2024 CLINICAL DATA:  Concern for pulmonary embolism. EXAM: CT ANGIOGRAPHY CHEST WITH CONTRAST TECHNIQUE: Multidetector CT imaging of the chest was performed using the standard protocol during bolus administration of intravenous contrast. Multiplanar CT image reconstructions and MIPs were obtained to evaluate the vascular anatomy. RADIATION DOSE REDUCTION: This exam was performed according to the departmental dose-optimization program which includes automated exposure control, adjustment of the mA and/or kV according to patient size and/or use of iterative reconstruction technique. CONTRAST:  75mL OMNIPAQUE  IOHEXOL  350 MG/ML SOLN COMPARISON:  Chest radiograph dated 02/20/2024. FINDINGS: Evaluation of this exam is limited due to respiratory motion. Cardiovascular: Top-normal cardiac size. No pericardial effusion. The thoracic aorta is unremarkable. The origins of the great vessels of the aortic arch appear patent. Evaluation of the pulmonary arteries is limited due to respiratory motion. No central pulmonary artery embolus identified. Mediastinum/Nodes: No hilar adenopathy. The esophagus is grossly unremarkable no mediastinal fluid collection. Lungs/Pleura: Large right pleural effusion. There is complete consolidation of the right lower lobe and partial consolidation of the right middle lobe which may represent atelectasis. A pneumonia or underlying mass is not excluded. Clinical correlation and follow-up to resolution recommended. Tiny left upper lobe calcified granuloma. No pneumothorax. The central airways are patent. Upper Abdomen: Cirrhosis, ascites, and splenomegaly. Musculoskeletal: No acute osseous pathology. Review of the MIP images confirms the above findings. IMPRESSION: 1. No CT evidence of central pulmonary artery embolus. 2. Large right pleural effusion with compressive  atelectasis of the right lower and right middle lobes. Pneumonia or underlying mass are not excluded. Clinical correlation and follow-up to resolution recommended. 3. Cirrhosis, ascites, and splenomegaly. Electronically Signed   By: Vanetta Chou M.D.   On: 02/20/2024 20:35   DG Ankle Complete Right Result Date: 02/20/2024 CLINICAL DATA:  Right ankle pain for 3.5 weeks EXAM: RIGHT ANKLE - COMPLETE 3+ VIEW COMPARISON:  02/05/2024 FINDINGS: Frontal, oblique, and lateral views of the right ankle are obtained. Postsurgical changes are seen from prior bimalleolar fracture repair. No acute fracture, subluxation, or dislocation. Mild osteoarthritis of the ankle, hindfoot, and midfoot. Mild diffuse subcutaneous edema unchanged. IMPRESSION: 1. Stable soft tissue edema. 2. No acute bony  abnormality. Electronically Signed   By: Ozell Daring M.D.   On: 02/20/2024 19:40   DG Chest 2 View Result Date: 02/20/2024 CLINICAL DATA:  Shortness of breath. EXAM: CHEST - 2 VIEW COMPARISON:  Chest radiograph dated 02/22/2024. FINDINGS: Small right pleural effusion and right lung base atelectasis or infiltrate similar to prior radiograph. No pneumothorax. Stable cardiac silhouette. No acute osseous pathology. IMPRESSION: Small right pleural effusion and right lung base atelectasis or infiltrate. Electronically Signed   By: Vanetta Chou M.D.   On: 02/20/2024 18:48   DG Chest 2 View Result Date: 02/20/2024 EXAM: 2 VIEW(S) XRAY OF THE CHEST 02/20/2024 04:00:00 PM COMPARISON: None available. CLINICAL HISTORY: Shortness of breath with exertion for 5 days, non-alcoholic fatty liver, jaundice. FINDINGS: LUNGS AND PLEURA: Right lower lung opacity. Moderate right pleural effusion. No pneumothorax. HEART AND MEDIASTINUM: No acute abnormality of the cardiac and mediastinal silhouettes. BONES AND SOFT TISSUES: No acute osseous abnormality. IMPRESSION: 1. Moderate right pleural effusion and lower lung opacity, new since previous .  Electronically signed by: Katheleen Faes MD 02/20/2024 04:20 PM EDT RP Workstation: [id number]    EKG:  Personally reviewed, sinus rhythm, inferior Q waves, no acute ischemic changes.  ED Course:  Treated with Lasix  40 mg IV x 1, morphine    Assessment/Plan:  48 y.o. male with hx decompensated alcoholic cirrhosis with ascites, EV, portal HTN gastropathy, splenomegaly, psoriasis, who presented with R ankle pain and difficulty ambulating to UC and referred to ED due to SOB and large R pleural effusion likely hepatic hydrothorax.  Hepatic hydrothorax, symptomatic - IR evaluation for thoracentesis, anticipate may be Monday  - In meantime Start on diuretic therapy, s/p lasix  40 IV in ED, give additional 40 IV in AM, and start on Spironolactone  50 mg. lasix  40 mg PO daily to add the following day.  -- Repeat CXR PA/L Monday AM to reasses.  - Close follow-up within 1 week outpatient for repeat lab work including BMP  Decompensated alcoholic cirrhosis Labs on admission T. bili 4, direct 1.3, AST 68, ALT 37, alk phos 203, albumin  2.2 - MELD-Na 17 - HE: None  - Ascites / Hepatic hydrothorax: See above re: diuretic, plan for thoracentesis given symptomatic effusion. Low sodium diet  - EV: + 3 column Small varices on last EGD 9/'24. Outpatient surveillance.  Continue home nadolol  - Portal hypertensive gastropathy: On pantoprazole  -- I have counseled him on decompensated nature of his cirrhosis, and advised that there is no safe amount of alcohol, and that continued alcohol use may impact future candidacy for transplantation. He says that it is no problem and he will quit and maintain strict abstinence from here on.  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 Has recently also restarted on Lasix , although uric acid on 8/29 was 3.1. XR demonstrates stable hardware post fixation (prior fracture), and soft  tissue edema.  -- Routine orthopedic surgery consultation in AM for consideration of aspiration and glucocorticoid injection of the R ankle joint  -- Diclofenac  gel, low dose tylenol  prn   Chronic medical problems: Splenomegaly, mild thrombocytopenia: Related to cirrhosis   Psoriasis: Outpatient f/u   MELD 3.0: 19 at 02/20/2024  7:22 PM MELD-Na: 17 at 02/20/2024  7:22 PM Calculated from: Serum Creatinine: 0.65 mg/dL (Using min of 1 mg/dL) at 0/86/7974  4:76 PM Serum Sodium: 136 mmol/L at 02/20/2024  5:23 PM Total Bilirubin: 4 mg/dL at 0/86/7974  2:77 PM Serum  Albumin : 2.2 g/dL at 0/86/7974  2:77 PM INR(ratio): 1.5 at 02/20/2024  7:22 PM Age at listing (hypothetical): 48 years Sex: Male at 02/20/2024  7:22 PM    Body mass index is 45.93 kg/m. Obesity class III would benefit from weight loss outpatient    DVT prophylaxis:  SCDs Code Status:  {Blank single:19197::Full Code,DNR with Intubation,DNR/DNI(Do NOT Intubate),Comfort Care,***} Diet:  Diet Orders (From admission, onward)    None      Family Communication:  ***  Consults:  ***  Admission status:   {Blank single:19197::Observation,Inpatient}, {Blank single:19197::Med-Surg,Telemetry bed,Step Down Unit}  Severity of Illness: {Observation/Inpatient:21159}   Dorn Dawson, MD [facility]  How to contact the [facility] Attending or Consulting provider 7A - 7P or covering provider during after hours 7P -7A, for this patient.  Check the care team in [facility] and look for a) attending/consulting [facility] provider listed and b) the [facility] team listed Log into [email or web address] and use [place]'s universal password to access. If you do not have the password, please contact the hospital operator. Locate the [facility] provider you are looking for under [facility] and page to a number that you can be directly reached. If you still have difficulty reaching the provider, please page the [facility] (Director on Call) for the  Hospitalists listed on amion for assistance.  02/20/2024, 10:53 PM

## 2024-02-20 NOTE — ED Provider Notes (Addendum)
 [facility]    CSN: [id number] Arrival date & time: 02/20/24  1508      History   Chief Complaint Chief Complaint  Patient presents with   Ankle Pain    HPI Thomas Thomas [name] is a 48 y.o. male.   Thomas Thomas [name] is a 48 y.o. male with history of alcoholic fatty liver, alcoholic cirrhosis, esophageal varices, cardiomegaly, and psoriasis presenting for chief complaint of pain and swelling of the right ankle that has been chronic since his ankle surgery in 2024. He was treated for psoriasis flare and potential inflammatory causes of right ankle swelling (gout, osteoarthritis, etc) with methylprednisolone  Dosepak on February 05, 2024. He also takes lasix  PRN For leg swelling.  Swelling and pain improved significantly and almost fully resolved while taking steroid then returned quickly upon stopping steroid.   Denies new erythema, drainage from plaques to the right ankle. Denies recent fever/chills.  Denies history of malignancy, recent long car rides/travel, and history of DVT. He has been taking lots of tylenol  as advised by his PCP to try to treat pain of right ankle without relief.   He has an appointment with dermatology in 1 month for psoriasis follow-up.  He still drinks alcohol intermittently but not Thomas [name] like he used to.   Complains of new shortness of breath starting 4 days ago without chest pain or cough. Shortness of breath is triggered by exertional activity and is new from baseline. Denies new abdominal distension/pain, abnormal bleeding/bruising, and new skin color changes. Further denies orthopnea, vomiting, diarrhea, melena, urinary symptoms, or dizziness.  Echocardiogram in February 2025 shows EF of 55-60% on chart review.   The history is provided by the patient and medical records. No language interpreter was used.    Past Medical History:  Diagnosis Date   Acute conjunctivitis of both eyes 10/22/2021   Acute sinusitis 06/16/2013   Alcoholic cirrhosis  (HCC)    Alcoholic fatty liver 01/16/2010   Needs final HBV and HAV vaccines on or after 10/25/2012    Alcoholism (HCC) 12/25/2011   Allergic rhinitis    Childhood asthma    Elevated transaminase level 06/10/2007   AST: 80 ALT: 136 in 8/11: Hepatitis A., B and C negative.    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    Sleep apnea     Patient Active Problem List   Diagnosis Date Noted   Cellulitis of abdominal wall 03/10/2023   Esophageal varices without bleeding (HCC) 02/13/2023   History of ETOH abuse 01/09/2023   Ankle fracture 01/07/2023   Localized edema 01/07/2023   Closed right ankle fracture 01/06/2023   Thrombocytopenia (HCC) 01/06/2023   Cirrhosis of liver (HCC) 12/25/2022   Secondary esophageal varices with bleeding (HCC) 12/25/2022   Reactive airway disease 07/28/2022   Acute upper GI bleed 07/05/2022   Hematemesis with nausea 07/05/2022   Melena 07/05/2022   Pharyngitis 10/22/2021   Psoriatic arthritis (HCC) 10/03/2021   Viral upper respiratory tract infection 10/03/2021   Acute blood loss anemia 08/26/2021   Screening for tuberculosis 08/15/2021   Elevated BP without diagnosis of hypertension 06/14/2021   Need for immunization against influenza 06/14/2021   Esophageal varices in alcoholic cirrhosis (HCC) 09/26/2019   Class 3 severe obesity due to excess calories with body mass  index (BMI) of 50.0 to 59.9 in adult 09/26/2019   Healthcare maintenance 02/07/2019   Psoriasis 02/07/2019   Vitamin D  deficiency 05/07/2016   OSA (obstructive sleep apnea) 06/23/2013   Insomnia 06/16/2013   Alcohol use disorder 12/25/2011   Scrotal varices, 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   Alcoholic fatty liver 01/16/2010   Allergic rhinitis 10/12/2007    Past Surgical History:  Procedure Laterality Date    ESOPHAGEAL BANDING  12/26/2022   Procedure: ESOPHAGEAL BANDING;  Surgeon: Shila Gustav GAILS, MD;  Location: [facility];  Service: Gastroenterology;;   ESOPHAGOGASTRODUODENOSCOPY (EGD) WITH PROPOFOL  N/A 07/05/2022   Procedure: ESOPHAGOGASTRODUODENOSCOPY (EGD) WITH PROPOFOL ;  Surgeon: Shila Gustav GAILS, MD;  Location: [facility];  Service: Gastroenterology;  Laterality: N/A;   ESOPHAGOGASTRODUODENOSCOPY (EGD) WITH PROPOFOL  N/A 12/26/2022   Procedure: ESOPHAGOGASTRODUODENOSCOPY (EGD) WITH PROPOFOL ;  Surgeon: Shila Gustav GAILS, MD;  Location: [facility];  Service: Gastroenterology;  Laterality: N/A;   ESOPHAGOGASTRODUODENOSCOPY (EGD) WITH PROPOFOL  N/A 02/12/2023   Procedure: ESOPHAGOGASTRODUODENOSCOPY (EGD) WITH PROPOFOL ;  Surgeon: Shila Gustav GAILS, MD;  Location: [facility];  Service: Gastroenterology;  Laterality: N/A;   ORIF ANKLE FRACTURE Right 12/28/2022   Procedure: OPEN REDUCTION INTERNAL FIXATION (ORIF) ANKLE FRACTURE;  Surgeon: Josefina Chew, MD;  Location: [facility] OR;  Service: Orthopedics;  Laterality: Right;       Home Medications    Prior to Admission medications   Medication Sig Start Date End Date Taking? Authorizing Provider  acetaminophen  (TYLENOL ) 325 MG tablet Take 1-2 tablets (325-650 mg total) by mouth every 4 (four) hours as needed for mild pain. 01/08/23   Love, Sharlet RAMAN, PA-C  albuterol  (VENTOLIN  HFA) 108 (90 Base) MCG/ACT inhaler Inhale 2 puffs into the lungs every 6 (six) hours as needed for wheezing or shortness of breath. 05/06/23   Sebastian Beverley NOVAK, MD  Cyanocobalamin 1000 MCG CAPS Take 1 tablet by mouth Thomas [name]. 06/19/23 06/18/24  [provider]  cyclobenzaprine  (FLEXERIL ) 5 MG tablet Take 1 tablet (5 mg total) by mouth 3 (three) times Thomas [name] as needed for muscle spasms. Patient not taking: Reported on 11/26/2023 01/12/23   Maurice Sharlet RAMAN, PA-C  diclofenac  Sodium (VOLTAREN ) 1 % GEL Apply 2 g topically 4 (four) times Thomas [name]. Patient not taking: Reported on  11/26/2023 01/12/23   Love, Sharlet RAMAN, PA-C  diphenoxylate -atropine  (LOMOTIL ) 2.5-0.025 MG tablet Take 1 tablet by mouth every 6 (six) hours as needed for diarrhea or loose stools. 11/06/23   Beather Delon Gibson, PA  ferrous sulfate  325 (65 FE) MG tablet Take 1 tablet (325 mg total) by mouth Thomas [name] with breakfast. Patient not taking: Reported on 11/26/2023 01/12/23   Love, Sharlet RAMAN, PA-C  folic acid  (FOLVITE ) 1 MG tablet Take 1 mg by mouth Thomas [name]. 11/25/23   [provider]  furosemide  (LASIX ) 40 MG tablet Take 1 tablet (40 mg total) by mouth Thomas [name]. Patient not taking: Reported on 11/26/2023 03/11/23   Beather Delon Gibson, PA  lipase/protease/amylase (CREON ) 36000 UNITS CPEP capsule Take 2 capsules (72,000 Units total) by mouth 3 (three) times Thomas [name] with meals. May also take 1 capsule (36,000 Units total) as needed (with snacks). Patient not taking: No sig reported 02/12/23   Nandigam, Kavitha V, MD  methylPREDNISolone  (MEDROL  DOSEPAK) 4 MG TBPK tablet Take as directed Patient not taking: Reported on 02/20/2024 02/05/24   Darral Longs, MD  nadolol  (CORGARD ) 20 MG tablet Take 1 tablet (20 mg total) by mouth Thomas [name]. Patient not taking: Reported on 11/26/2023 03/11/23 03/10/24  Beather Delon Gibson, PA  pantoprazole  (PROTONIX ) 40 MG  tablet Take 1 tablet (40 mg total) by mouth 2 (two) times Thomas [name]. 12/24/23   Berneta Elsie Sayre, MD  potassium chloride  SA (KLOR-CON  M) 20 MEQ tablet Take 1 tablet (20 mEq total) by mouth Thomas [name]. Patient not taking: Reported on 11/26/2023 01/13/23   Maurice Sharlet RAMAN, PA-C  Thiamine HCl (VITAMIN B-1) 250 MG tablet Take 1 tablet by mouth Thomas [name]. 06/19/23 06/18/24  [provider]    Family History Family History  Problem Relation Age of Onset   Liver disease Mother    Depression Mother    Liver disease Father    Diabetes Father    Hypertension Father    Liver disease Sister    Diabetes Other    Colon cancer Neg Hx    Esophageal cancer Neg Hx    Stomach cancer Neg  Hx    Colon polyps Neg Hx     Social History Social History   Tobacco Use   Smoking status: Some Days    Types: Cigars   Smokeless tobacco: Never   Tobacco comments:    Cigars occasional  Vaping Use   Vaping status: Never Used  Substance Use Topics   Alcohol use: Not Currently    Comment: Sober since November 2024   Drug use: Never    Comment: history of cocaine abuse     Allergies   Codeine   Review of Systems Review of Systems   Physical Exam Triage Vital Signs ED Triage Vitals  Encounter Vitals Group     BP 02/20/24 1531 (!) 142/85     Girls Systolic BP Percentile --      Girls Diastolic BP Percentile --      Boys Systolic BP Percentile --      Boys Diastolic BP Percentile --      Pulse Rate 02/20/24 1531 76     Resp 02/20/24 1531 17     Temp 02/20/24 1531 97.8 F (36.6 C)     Temp Source 02/20/24 1531 Oral     SpO2 02/20/24 1531 93 %     Weight --      Height --      Head Circumference --      Peak Flow --      Pain Score 02/20/24 1533 10     Pain Loc --      Pain Education --      Exclude from Growth Chart --    No data found.  Updated Vital Signs BP (!) 142/85 (BP Location: Right Arm)   Pulse 76   Temp 97.8 F (36.6 C) (Oral)   Resp 17   SpO2 93%   Visual Acuity Right Eye Distance:   Left Eye Distance:   Bilateral Distance:    Right Eye Near:   Left Eye Near:    Bilateral Near:     Physical Exam Vitals and nursing note reviewed.  Constitutional:      Appearance: He is ill-appearing. He is not toxic-appearing.  HENT:     Head: Normocephalic and atraumatic.     Right Ear: Hearing and external ear normal.     Left Ear: Hearing and external ear normal.     Nose: Nose normal.     Mouth/Throat:     Lips: Pink.  Eyes:     General: Lids are normal. Vision grossly intact. Gaze aligned appropriately.     Extraocular Movements: Extraocular movements intact.     Conjunctiva/sclera: Conjunctivae normal.  Cardiovascular:     Rate  and  Rhythm: Normal rate and regular rhythm.     Heart sounds: Normal heart sounds, S1 normal and S2 normal.  Pulmonary:     Effort: Pulmonary effort is normal. No tachypnea, accessory muscle usage, prolonged expiration, respiratory distress or retractions.     Breath sounds: Normal air entry. Examination of the right-middle field reveals decreased breath sounds. Examination of the right-lower field reveals decreased breath sounds. Decreased breath sounds present. No wheezing, rhonchi or rales.     Comments: Speaking in full sentences without difficulty. Markedly decreased breath sounds to the right lower and middle lung fields.  Chest:     Chest wall: No tenderness.  Musculoskeletal:     Cervical back: Neck supple.     Right ankle: Swelling (+1 pitting edema about the generalized right ankle) present. Tenderness (diffuse over the right anterior ankle) present. Normal range of motion. Normal pulse (+2 right DP).     Left ankle: Normal.     Comments: Plaque-like and shiny appearing lesions consistent with psoriasis to the right distal calf. See image below. Sensation intact to distal right foot. Ambulatory with cane with limp due to pain of right ankle.   Skin:    General: Skin is warm and dry.     Capillary Refill: Capillary refill takes less than 2 seconds.     Coloration: Skin is jaundiced.     Findings: No rash.  Neurological:     General: No focal deficit present.     Mental Status: He is alert and oriented to person, place, and time. Mental status is at baseline.     Cranial Nerves: No dysarthria or facial asymmetry.  Psychiatric:        Mood and Affect: Mood normal.        Speech: Speech normal.        Behavior: Behavior normal.        Thought Content: Thought content normal.        Judgment: Judgment normal.       UC Treatments / Results  Labs (all labs ordered are listed, but only abnormal results are displayed) Labs Reviewed - No data to display  EKG   Radiology EXAM: 2  VIEW(S) XRAY OF THE CHEST 02/20/2024 04:00:00 PM   COMPARISON: None available.   CLINICAL HISTORY: Shortness of breath with exertion for 5 days, non-alcoholic fatty liver, jaundice.   FINDINGS:   LUNGS AND PLEURA: Right lower lung opacity. Moderate right pleural effusion. No pneumothorax.   HEART AND MEDIASTINUM: No acute abnormality of the cardiac and mediastinal silhouettes.   BONES AND SOFT TISSUES: No acute osseous abnormality.   IMPRESSION: 1. Moderate right pleural effusion and lower lung opacity, new since previous .   Electronically signed by: Dayne Hassell MD 02/20/2024 04:20 PM EDT RP Workstation: [id number]  Procedures Procedures (including critical care time)  Medications Ordered in UC Medications - No data to display  Initial Impression / Assessment and Plan / UC Course  I have reviewed the triage vital signs and the nursing notes.  Pertinent labs & imaging results that were available during my care of the patient were reviewed by me and considered in my medical decision making (see chart for details).   1. Shortness of breath, right pleural effusion, pain and swelling of right ankle, alcoholic cirrhosis Presentation concerning for potential DVT to the right ankle versus inflammatory/arthritic causes of swelling, warmth, and pain. Additionally, chest x-ray performed after lung exam findings shows large right pleural effusion that is new  from previous study done in 2024.   Most recent LFTs performed in march 2025.   Patient would greatly benefit from stat labs and likely advanced imaging of the chest to further evaluate extent of and underlying causes of new pleural effusion.  Vitals stable, oxygen 93% on room air. No signs of acute respiratory distress.   Discussed clinical concerns/exam findings leading to recommendation for further workup in the ER setting and risks of deferring ER visit with patient/family. Patient/family express understanding and  agreement with plan, discharged to ER via private car with his father.  Final Clinical Impressions(s) / UC Diagnoses   Final diagnoses:  Shortness of breath  Pleural effusion on right  Pain and swelling of right ankle  Alcoholic cirrhosis, unspecified whether ascites present [facility])   Discharge Instructions   None    ED Prescriptions   None    PDMP not reviewed this encounter.     Enedelia Dorna HERO, FNP 02/20/24 2113    Enedelia Dorna HERO, FNP 02/20/24 2126

## 2024-02-20 NOTE — H&P (Signed)
 History and Physical    [name]:[id number] DOB: Oct 23, 1975 DOA: 02/20/2024  PCP: Berneta Elsie Sayre, MD   Patient coming from: Home   Chief Complaint:  Chief Complaint  Patient presents with   Shortness of Breath    HPI:  Thomas [name] is a 48 y.o. male with hx of decompensated alcoholic cirrhosis with ascites, EV, splenomegaly, psoriasis, who presented with worsening pain and swelling in R ankle to UC, and referred to the ED due to SOB with large R pleural effusion. Reports that over past month noted worsening bilateral LE swelling and restarted taking lasix  40 mg daily, which improved his bilateral swelling. However, then the R ankle flared about 3 weeks ago. With pain and swelling which is impairing his functional status and ability to ambulate. He is using a cane, and at times has tried to use his dads walker. Received an IM steroid injection which helped calm down symptoms briefly but returned. Otherwise just taking tylenol  for relief.   While mainly been worried about his ankle, has noted over the past week that he was more short of breath, and having DOE. Activity has been limited recently due to ankle. No associated chest pain. No cough / cold or other illness. Has not noted any increased abd swelling. Did have BLE edema as mentioned above but this has improved with lasix . He does drink on social occasions.    Review of Systems:  ROS complete and negative except as marked above   Allergies  Allergen Reactions   Codeine Other (See Comments)    Jittery     Prior to Admission medications   Medication Sig Start Date End Date Taking? Authorizing Provider  acetaminophen  (TYLENOL ) 325 MG tablet Take 1-2 tablets (325-650 mg total) by mouth every 4 (four) hours as needed for mild pain. 01/08/23   Love, Sharlet GORMAN, PA-C  albuterol  (VENTOLIN  HFA) 108 (90 Base) MCG/ACT inhaler Inhale 2 puffs into the lungs every 6 (six) hours as needed for wheezing or shortness of breath.  05/06/23   Sebastian Beverley NOVAK, MD  Cyanocobalamin 1000 MCG CAPS Take 1 tablet by mouth daily. 06/19/23 06/18/24  [provider]  cyclobenzaprine  (FLEXERIL ) 5 MG tablet Take 1 tablet (5 mg total) by mouth 3 (three) times daily as needed for muscle spasms. Patient not taking: Reported on 11/26/2023 01/12/23   Maurice Sharlet GORMAN, PA-C  diclofenac  Sodium (VOLTAREN ) 1 % GEL Apply 2 g topically 4 (four) times daily. Patient not taking: Reported on 11/26/2023 01/12/23   Love, Sharlet GORMAN, PA-C  diphenoxylate -atropine  (LOMOTIL ) 2.5-0.025 MG tablet Take 1 tablet by mouth every 6 (six) hours as needed for diarrhea or loose stools. 11/06/23   Beather Delon Gibson, PA  ferrous sulfate  325 (65 FE) MG tablet Take 1 tablet (325 mg total) by mouth daily with breakfast. Patient not taking: Reported on 11/26/2023 01/12/23   Love, Sharlet GORMAN, PA-C  folic acid  (FOLVITE ) 1 MG tablet Take 1 mg by mouth daily. 11/25/23   [provider]  furosemide  (LASIX ) 40 MG tablet Take 1 tablet (40 mg total) by mouth daily. Patient not taking: Reported on 11/26/2023 03/11/23   Beather Delon Gibson, PA  lipase/protease/amylase (CREON ) 36000 UNITS CPEP capsule Take 2 capsules (72,000 Units total) by mouth 3 (three) times daily with meals. May also take 1 capsule (36,000 Units total) as needed (with snacks). Patient not taking: No sig reported 02/12/23   Nandigam, Kavitha V, MD  methylPREDNISolone  (MEDROL  DOSEPAK) 4 MG TBPK tablet Take  as directed Patient not taking: Reported on 02/20/2024 02/05/24   Darral Longs, MD  nadolol  (CORGARD ) 20 MG tablet Take 1 tablet (20 mg total) by mouth daily. Patient not taking: Reported on 11/26/2023 03/11/23 03/10/24  Beather Delon Gibson, PA  pantoprazole  (PROTONIX ) 40 MG tablet Take 1 tablet (40 mg total) by mouth 2 (two) times daily. 12/24/23   Berneta Elsie Sayre, MD  potassium chloride  SA (KLOR-CON  M) 20 MEQ tablet Take 1 tablet (20 mEq total) by mouth daily. Patient not taking: Reported on  11/26/2023 01/13/23   Maurice Sharlet RAMAN, PA-C  Thiamine HCl (VITAMIN B-1) 250 MG tablet Take 1 tablet by mouth daily. 06/19/23 06/18/24  [provider]    Past Medical History:  Diagnosis Date   Acute conjunctivitis of both eyes 10/22/2021   Acute sinusitis 06/16/2013   Alcoholic cirrhosis (HCC)    Alcoholic fatty liver 01/16/2010   Needs final HBV and HAV vaccines on or after 10/25/2012    Alcoholism (HCC) 12/25/2011   Allergic rhinitis    Childhood asthma    Elevated transaminase level 06/10/2007   AST: 80 ALT: 136 in 8/11: Hepatitis A., B and C negative.    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    Sleep apnea     Past Surgical History:  Procedure Laterality Date   ESOPHAGEAL BANDING  12/26/2022   Procedure: ESOPHAGEAL BANDING;  Surgeon: Shila Gustav GAILS, MD;  Location: [facility];  Service: Gastroenterology;;   ESOPHAGOGASTRODUODENOSCOPY (EGD) WITH PROPOFOL  N/A 07/05/2022   Procedure: ESOPHAGOGASTRODUODENOSCOPY (EGD) WITH PROPOFOL ;  Surgeon: Shila Gustav GAILS, MD;  Location: [facility];  Service: Gastroenterology;  Laterality: N/A;   ESOPHAGOGASTRODUODENOSCOPY (EGD) WITH PROPOFOL  N/A 12/26/2022   Procedure: ESOPHAGOGASTRODUODENOSCOPY (EGD) WITH PROPOFOL ;  Surgeon: Shila Gustav GAILS, MD;  Location: [facility];  Service: Gastroenterology;  Laterality: N/A;   ESOPHAGOGASTRODUODENOSCOPY (EGD) WITH PROPOFOL  N/A 02/12/2023   Procedure: ESOPHAGOGASTRODUODENOSCOPY (EGD) WITH PROPOFOL ;  Surgeon: Shila Gustav GAILS, MD;  Location: [facility];  Service: Gastroenterology;  Laterality: N/A;   ORIF ANKLE FRACTURE Right 12/28/2022   Procedure: OPEN REDUCTION INTERNAL FIXATION (ORIF) ANKLE FRACTURE;  Surgeon: Josefina Chew, MD;  Location: [facility] OR;  Service: Orthopedics;  Laterality: Right;      reports that he has been smoking cigars. He has never used smokeless tobacco. He reports that he does not currently use alcohol. He reports that he does not use drugs.  Family History  Problem Relation Age of Onset   Liver disease Mother    Depression Mother    Liver disease Father    Diabetes Father    Hypertension Father    Liver disease Sister    Diabetes Other    Colon cancer Neg Hx    Esophageal cancer Neg Hx    Stomach cancer Neg Hx    Colon polyps Neg Hx      Physical Exam: Vitals:   02/20/24 1719 02/20/24 1719 02/20/24 1745 02/20/24 1930  BP:  (!) 143/76 120/69 (!) 114/94  Pulse:  69 64 62  Resp:  18  16  Temp:  98 F (36.7 C)    SpO2:  95% 93% 96%  Weight: 125.2 kg     Height: 5' 5 (1.651 m)       Gen: Awake, alert, NAD   CV: Regular, normal  S1, S2, no murmurs  Resp: Normal WOB, diminished in the lower 1/2 posterior fields on the R, referred bronchial sounds  Abd: Obese, normoactive, nontender MSK: R ankle is edematous, without overlying erythema, but is slightly warm compared to contralateral. There are scars from prior ankle fixation. No edema above the ankle on R. No edema on L.  Skin: Psoriatic plaques present  Neuro: Alert and interactive  Psych: euthymic, appropriate    Data review:   Labs reviewed, notable for:   T. bili 4, direct 1.3, AST 68, ALT 37, alk phos 203, albumin  2.2 BNP 33 High-sensitivity Trop negative x 2 WBC 5 Platelet 136 INR 1.5  Micro:  Results for orders placed or performed in visit on 02/05/23  Stool culture     Status: None   Collection Time: 02/05/23  9:19 AM  Result Value Ref Range Status   Salmonella/Shigella Screen Final report  Final   Stool Culture result 1 (RSASHR) Comment  Final    Comment: No Salmonella or Shigella recovered.   Campylobacter Culture Final report  Final   Stool Culture result 1 (CMPCXR) Comment  Final    Comment: No Campylobacter species isolated.   E coli, Shiga toxin Assay Negative Negative  Final    Imaging reviewed:  CT Angio Chest PE W and/or Wo Contrast Result Date: 02/20/2024 CLINICAL DATA:  Concern for pulmonary embolism. EXAM: CT ANGIOGRAPHY CHEST WITH CONTRAST TECHNIQUE: Multidetector CT imaging of the chest was performed using the standard protocol during bolus administration of intravenous contrast. Multiplanar CT image reconstructions and MIPs were obtained to evaluate the vascular anatomy. RADIATION DOSE REDUCTION: This exam was performed according to the departmental dose-optimization program which includes automated exposure control, adjustment of the mA and/or kV according to patient size and/or use of iterative reconstruction technique. CONTRAST:  75mL OMNIPAQUE  IOHEXOL  350 MG/ML SOLN COMPARISON:  Chest radiograph dated 02/20/2024. FINDINGS: Evaluation of this exam is limited due to respiratory motion. Cardiovascular: Top-normal cardiac size. No pericardial effusion. The thoracic aorta is unremarkable. The origins of the great vessels of the aortic arch appear patent. Evaluation of the pulmonary arteries is limited due to respiratory motion. No central pulmonary artery embolus identified. Mediastinum/Nodes: No hilar adenopathy. The esophagus is grossly unremarkable no mediastinal fluid collection. Lungs/Pleura: Large right pleural effusion. There is complete consolidation of the right lower lobe and partial consolidation of the right middle lobe which may represent atelectasis. A pneumonia or underlying mass is not excluded. Clinical correlation and follow-up to resolution recommended. Tiny left upper lobe calcified granuloma. No pneumothorax. The central airways are patent. Upper Abdomen: Cirrhosis, ascites, and splenomegaly. Musculoskeletal: No acute osseous pathology. Review of the MIP images confirms the above findings. IMPRESSION: 1. No CT evidence of central pulmonary artery embolus. 2. Large right pleural effusion with compressive atelectasis of the right lower and right  middle lobes. Pneumonia or underlying mass are not excluded. Clinical correlation and follow-up to resolution recommended. 3. Cirrhosis, ascites, and splenomegaly. Electronically Signed   By: Vanetta Chou M.D.   On: 02/20/2024 20:35   DG Ankle Complete Right Result Date: 02/20/2024 CLINICAL DATA:  Right ankle pain for 3.5 weeks EXAM: RIGHT ANKLE - COMPLETE 3+ VIEW COMPARISON:  02/05/2024 FINDINGS: Frontal, oblique, and lateral views of the right ankle are obtained. Postsurgical changes are seen from prior bimalleolar fracture repair. No acute fracture, subluxation, or dislocation. Mild osteoarthritis of the ankle, hindfoot, and midfoot. Mild diffuse subcutaneous edema unchanged. IMPRESSION: 1. Stable soft tissue edema. 2. No acute bony  abnormality. Electronically Signed   By: Ozell Daring M.D.   On: 02/20/2024 19:40   DG Chest 2 View Result Date: 02/20/2024 CLINICAL DATA:  Shortness of breath. EXAM: CHEST - 2 VIEW COMPARISON:  Chest radiograph dated 02/22/2024. FINDINGS: Small right pleural effusion and right lung base atelectasis or infiltrate similar to prior radiograph. No pneumothorax. Stable cardiac silhouette. No acute osseous pathology. IMPRESSION: Small right pleural effusion and right lung base atelectasis or infiltrate. Electronically Signed   By: Vanetta Chou M.D.   On: 02/20/2024 18:48   DG Chest 2 View Result Date: 02/20/2024 EXAM: 2 VIEW(S) XRAY OF THE CHEST 02/20/2024 04:00:00 PM COMPARISON: None available. CLINICAL HISTORY: Shortness of breath with exertion for 5 days, non-alcoholic fatty liver, jaundice. FINDINGS: LUNGS AND PLEURA: Right lower lung opacity. Moderate right pleural effusion. No pneumothorax. HEART AND MEDIASTINUM: No acute abnormality of the cardiac and mediastinal silhouettes. BONES AND SOFT TISSUES: No acute osseous abnormality. IMPRESSION: 1. Moderate right pleural effusion and lower lung opacity, new since previous . Electronically signed by: Katheleen Faes MD  02/20/2024 04:20 PM EDT RP Workstation: [id number]    EKG:  Personally reviewed, sinus rhythm, inferior Q waves, no acute ischemic changes.  ED Course:  Treated with Lasix  40 mg IV x 1, morphine    Assessment/Plan:  48 y.o. male with hx decompensated alcoholic cirrhosis with ascites, EV, portal HTN gastropathy, splenomegaly, psoriasis, who presented with R ankle pain and difficulty ambulating to UC and referred to ED due to SOB and large R pleural effusion likely hepatic hydrothorax.   Hepatic hydrothorax, symptomatic - IR evaluation for thoracentesis, anticipate may be Monday  - In meantime Start on diuretic therapy, s/p lasix  40 IV in ED, give additional 40 IV in AM, and start on Spironolactone  50 mg. lasix  40 mg PO daily to add the following day.  -- Repeat CXR PA/L Monday AM to reasses.  - Close follow-up within 1 week outpatient for repeat lab work including BMP  Decompensated alcoholic cirrhosis Acute on chronic liver injury  Labs on admission T. bili 4, direct 1.3, AST 68, ALT 37, alk phos 203, albumin  2.2 - MELD-Na 17 - HE: None  - Ascites / Hepatic hydrothorax: See above re: diuretic, plan for thoracentesis given symptomatic effusion. Low sodium diet  - EV: + 3 column Small varices on last EGD 9/'24. Outpatient surveillance.  Continue home nadolol  - Portal hypertensive gastropathy: On pantoprazole  -- I have counseled him on decompensated nature of his cirrhosis, and advised that there is no safe amount of alcohol, and that continued alcohol use may impact future candidacy for transplantation. He says that it is no problem and he will quit and maintain strict abstinence from here on.  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 Has recently also restarted on Lasix , although uric acid on 8/29 was 3.1. XR demonstrates stable hardware post fixation (prior fracture), and soft tissue  edema.  -- Routine orthopedic surgery consultation in AM for consideration of aspiration and glucocorticoid injection of the R ankle joint  -- Diclofenac  gel, low dose tylenol  prn  -- PT assess for DME needs   Alcohol use disorder  Currently social drinking I/s/o cirrhosis.  -- See above, counseled on strict alcohol abstinence  -- TOC for substance use resources   Chronic medical problems: Splenomegaly, mild thrombocytopenia: Related to cirrhosis   Psoriasis: With skin flare; start on Clobetasol  0.5% cream BID to plaques on extremities;  avoid use on face, intertriginous zones, or genitals   MELD 3.0: 19 at 02/20/2024  7:22 PM MELD-Na: 17 at 02/20/2024  7:22 PM Calculated from: Serum Creatinine: 0.65 mg/dL (Using min of 1 mg/dL) at 0/86/7974  4:76 PM Serum Sodium: 136 mmol/L at 02/20/2024  5:23 PM Total Bilirubin: 4 mg/dL at 0/86/7974  2:77 PM Serum Albumin : 2.2 g/dL at 0/86/7974  2:77 PM INR(ratio): 1.5 at 02/20/2024  7:22 PM Age at listing (hypothetical): 48 years Sex: Male at 02/20/2024  7:22 PM    Body mass index is 45.93 kg/m. Obesity class III would benefit from weight loss outpatient    DVT prophylaxis:  SCDs Code Status:  Full Code Diet:  Diet Orders (From admission, onward)    None      Family Communication:  Yes discussed with father at bedside   Consults:  None   Admission status:   Observation, Med-Surg  Severity of Illness: The appropriate patient status for this patient is OBSERVATION. Observation status is judged to be reasonable and necessary in order to provide the required intensity of service to ensure the patient's safety. The patient's presenting symptoms, physical exam findings, and initial radiographic and laboratory data in the context of their medical condition is felt to place them at decreased risk for further clinical deterioration. Furthermore, it is anticipated that the patient will be medically stable for discharge from the hospital within 2  midnights of admission.    Dorn Dawson, MD [facility]  How to contact the [facility] Attending or Consulting provider 7A - 7P or covering provider during after hours 7P -7A, for this patient.  Check the care team in [facility] and look for a) attending/consulting [facility] provider listed and b) the [facility] team listed Log into [email or web address] and use [facility]'s universal password to access. If you do not have the password, please contact the hospital operator. Locate the [facility] provider you are looking for under [facility] and page to a number that you can be directly reached. If you still have difficulty reaching the provider, please page the [facility] (Director on Call) for the Hospitalists listed on amion for assistance.  02/20/2024, 10:53 PM

## 2024-02-20 NOTE — ED Provider Notes (Signed)
 [place] EMERGENCY DEPARTMENT AT [facility] Provider Note   CSN: [id number] Arrival date & time: 02/20/24  1713    Patient presents with: Shortness of Breath   Thomas [name] is a 48 y.o. male history of decompensated cirrhosis due to EtOH abuse, cardiomegaly, psoriasis here for evaluation of right lower extremity pain, CP, SOB.  Patient states he has had intermittent lower extremity swelling and pain since he had surgery for a fracture in 2024.  Did a course of Lasix  and prednisone  which helped his symptoms.  Symptoms returned after completing course.  He takes as needed Lasix  40 mg as needed for lower extremity swelling, taking wo helping his edema or SOB.  He states he is not drinking alcohol.  He is followed with [facility] liver clinic not deemed to be a candidate for transplant at this time due to his prior continued EtOH use.  He states he has some right sided chest tightness which has been going on over the last week, feels like he cannot take a deep breath.  No history of PE or DVT.  Initially was not taking his Lasix  due to frequent urination however started taking it and has not been helping his shortness of breath. He is supposed to follow-up with dermatology in 1 month for his right leg pain thought due to psoriasis.  He has no redness, warmth, numbness to the extremity.  He denies any recent falls or injuries.  He was seen by urgent care earlier today and he had a chest x-ray performed which showed pleural effusion and he was subsequently sent here.  No fever, cough, hemoptysis, melena, blood per rectum, abdominal pain, nausea or vomiting.  He is compliant with his PPI and nadolol  for his known varices.  No recent surgery, immobilization or malignancy.  He occasionally has some shortness of breath however worse over the last week   HPI     Prior to Admission medications   Medication Sig Start Date End Date Taking? Authorizing Provider  acetaminophen  (TYLENOL ) 325 MG  tablet Take 1-2 tablets (325-650 mg total) by mouth every 4 (four) hours as needed for mild pain. 01/08/23   Love, Sharlet GORMAN, PA-C  albuterol  (VENTOLIN  HFA) 108 (90 Base) MCG/ACT inhaler Inhale 2 puffs into the lungs every 6 (six) hours as needed for wheezing or shortness of breath. 05/06/23   Sebastian Beverley NOVAK, MD  Cyanocobalamin 1000 MCG CAPS Take 1 tablet by mouth daily. 06/19/23 06/18/24  [provider]  cyclobenzaprine  (FLEXERIL ) 5 MG tablet Take 1 tablet (5 mg total) by mouth 3 (three) times daily as needed for muscle spasms. Patient not taking: Reported on 11/26/2023 01/12/23   Maurice Sharlet GORMAN, PA-C  diclofenac  Sodium (VOLTAREN ) 1 % GEL Apply 2 g topically 4 (four) times daily. Patient not taking: Reported on 11/26/2023 01/12/23   Maurice Sharlet GORMAN, PA-C  diphenoxylate -atropine  (LOMOTIL ) 2.5-0.025 MG tablet Take 1 tablet by mouth every 6 (six) hours as needed for diarrhea or loose stools. 11/06/23   Beather Delon Gibson, PA  ferrous sulfate  325 (65 FE) MG tablet Take 1 tablet (325 mg total) by mouth daily with breakfast. Patient not taking: Reported on 11/26/2023 01/12/23   Love, Sharlet GORMAN, PA-C  folic acid  (FOLVITE ) 1 MG tablet Take 1 mg by mouth daily. 11/25/23   [provider]  furosemide  (LASIX ) 40 MG tablet Take 1 tablet (40 mg total) by mouth daily. Patient not taking: Reported on 11/26/2023 03/11/23   Beather Delon Gibson, PA  lipase/protease/amylase (CREON ) 36000 UNITS CPEP capsule Take 2 capsules (72,000 Units total) by mouth 3 (three) times daily with meals. May also take 1 capsule (36,000 Units total) as needed (with snacks). Patient not taking: No sig reported 02/12/23   Nandigam, Kavitha V, MD  methylPREDNISolone  (MEDROL  DOSEPAK) 4 MG TBPK tablet Take as directed Patient not taking: Reported on 02/20/2024 02/05/24   Darral Longs, MD  nadolol  (CORGARD ) 20 MG tablet Take 1 tablet (20 mg total) by mouth daily. Patient not taking: Reported on 11/26/2023 03/11/23 03/10/24  Beather Delon Gibson, PA  pantoprazole  (PROTONIX ) 40 MG tablet Take 1 tablet (40 mg total) by mouth 2 (two) times daily. 12/24/23   Berneta Elsie Sayre, MD  potassium chloride  SA (KLOR-CON  M) 20 MEQ tablet Take 1 tablet (20 mEq total) by mouth daily. Patient not taking: Reported on 11/26/2023 01/13/23   Maurice Sharlet RAMAN, PA-C  Thiamine HCl (VITAMIN B-1) 250 MG tablet Take 1 tablet by mouth daily. 06/19/23 06/18/24  [provider]    Allergies: Codeine    Review of Systems  Constitutional: Negative.   HENT: Negative.    Respiratory:  Positive for shortness of breath. Negative for apnea, cough, choking, chest tightness, wheezing and stridor.   Cardiovascular:  Positive for chest pain and leg swelling. Negative for palpitations.  Gastrointestinal: Negative.   Genitourinary: Negative.   Musculoskeletal: Negative.   Skin: Negative.   Neurological: Negative.   All other systems reviewed and are negative.   Updated Vital Signs BP (!) 114/94   Pulse 62   Temp 98 F (36.7 C)   Resp 16   Ht 5' 5 (1.651 m)   Wt 125.2 kg   SpO2 96%   BMI 45.93 kg/m   Physical Exam Vitals and nursing note reviewed.  Constitutional:      General: He is not in acute distress.    Appearance: He is well-developed. He is obese. He is not ill-appearing, toxic-appearing or diaphoretic.  HENT:     Head: Normocephalic and atraumatic.  Eyes:     Pupils: Pupils are equal, round, and reactive to light.  Cardiovascular:     Rate and Rhythm: Normal rate and regular rhythm.     Pulses: Normal pulses.     Heart sounds: Normal heart sounds.  Pulmonary:     Effort: Pulmonary effort is normal. No respiratory distress.     Breath sounds: Examination of the right-lower field reveals decreased breath sounds. Decreased breath sounds present. No rhonchi or rales.     Comments: Mild decrease lung sounds on right lower lobe, speaks in full sentences without difficulty.  Clear left side. Chest:     Comments:  Nontender Abdominal:     General: Bowel sounds are normal. There is no distension.     Palpations: Abdomen is soft.     Comments: Nontender, no rebound or guarding.  Ascites  Genitourinary:    Rectum: Guaiac result negative.  Musculoskeletal:        General: Normal range of motion.     Cervical back: Normal range of motion and neck supple.     Right lower leg: No tenderness. Edema present.     Left lower leg: No tenderness. No edema.     Comments: Trace pitting edema to midshin right lower extremity.  Plaques right mid shin consistent with psoriasis.  No overlying erythema, warmth, drainage.  No fluctuance induration.  Old scar right medial malleolus.  No obvious joint effusion  Skin:    General:  Skin is warm and dry.     Capillary Refill: Capillary refill takes less than 2 seconds.  Neurological:     General: No focal deficit present.     Mental Status: He is alert and oriented to person, place, and time.     Cranial Nerves: No cranial nerve deficit.     Motor: No weakness.     (all labs ordered are listed, but only abnormal results are displayed) Labs Reviewed  BASIC METABOLIC PANEL WITH GFR - Abnormal; Notable for the following components:      Result Value   Glucose, Bld 111 (*)    Calcium 8.3 (*)    All other components within normal limits  CBC - Abnormal; Notable for the following components:   RBC 3.99 (*)    Hemoglobin 12.7 (*)    RDW 16.0 (*)    Platelets 136 (*)    All other components within normal limits  HEPATIC FUNCTION PANEL - Abnormal; Notable for the following components:   Total Protein 6.3 (*)    Albumin  2.2 (*)    AST 68 (*)    Alkaline Phosphatase 203 (*)    Total Bilirubin 4.0 (*)    Bilirubin, Direct 1.3 (*)    Indirect Bilirubin 2.7 (*)    All other components within normal limits  PROTIME-INR - Abnormal; Notable for the following components:   Prothrombin Time 18.7 (*)    INR 1.5 (*)    All other components within normal limits  BRAIN  NATRIURETIC PEPTIDE  TROPONIN I (HIGH SENSITIVITY)  TROPONIN I (HIGH SENSITIVITY)    EKG: None  Radiology: CT Angio Chest PE W and/or Wo Contrast Result Date: 02/20/2024 CLINICAL DATA:  Concern for pulmonary embolism. EXAM: CT ANGIOGRAPHY CHEST WITH CONTRAST TECHNIQUE: Multidetector CT imaging of the chest was performed using the standard protocol during bolus administration of intravenous contrast. Multiplanar CT image reconstructions and MIPs were obtained to evaluate the vascular anatomy. RADIATION DOSE REDUCTION: This exam was performed according to the departmental dose-optimization program which includes automated exposure control, adjustment of the mA and/or kV according to patient size and/or use of iterative reconstruction technique. CONTRAST:  75mL OMNIPAQUE  IOHEXOL  350 MG/ML SOLN COMPARISON:  Chest radiograph dated 02/20/2024. FINDINGS: Evaluation of this exam is limited due to respiratory motion. Cardiovascular: Top-normal cardiac size. No pericardial effusion. The thoracic aorta is unremarkable. The origins of the great vessels of the aortic arch appear patent. Evaluation of the pulmonary arteries is limited due to respiratory motion. No central pulmonary artery embolus identified. Mediastinum/Nodes: No hilar adenopathy. The esophagus is grossly unremarkable no mediastinal fluid collection. Lungs/Pleura: Large right pleural effusion. There is complete consolidation of the right lower lobe and partial consolidation of the right middle lobe which may represent atelectasis. A pneumonia or underlying mass is not excluded. Clinical correlation and follow-up to resolution recommended. Tiny left upper lobe calcified granuloma. No pneumothorax. The central airways are patent. Upper Abdomen: Cirrhosis, ascites, and splenomegaly. Musculoskeletal: No acute osseous pathology. Review of the MIP images confirms the above findings. IMPRESSION: 1. No CT evidence of central pulmonary artery embolus. 2. Large  right pleural effusion with compressive atelectasis of the right lower and right middle lobes. Pneumonia or underlying mass are not excluded. Clinical correlation and follow-up to resolution recommended. 3. Cirrhosis, ascites, and splenomegaly. Electronically Signed   By: Vanetta Chou M.D.   On: 02/20/2024 20:35   DG Ankle Complete Right Result Date: 02/20/2024 CLINICAL DATA:  Right ankle pain for 3.5 weeks  EXAM: RIGHT ANKLE - COMPLETE 3+ VIEW COMPARISON:  02/05/2024 FINDINGS: Frontal, oblique, and lateral views of the right ankle are obtained. Postsurgical changes are seen from prior bimalleolar fracture repair. No acute fracture, subluxation, or dislocation. Mild osteoarthritis of the ankle, hindfoot, and midfoot. Mild diffuse subcutaneous edema unchanged. IMPRESSION: 1. Stable soft tissue edema. 2. No acute bony abnormality. Electronically Signed   By: Ozell Daring M.D.   On: 02/20/2024 19:40   DG Chest 2 View Result Date: 02/20/2024 CLINICAL DATA:  Shortness of breath. EXAM: CHEST - 2 VIEW COMPARISON:  Chest radiograph dated 02/22/2024. FINDINGS: Small right pleural effusion and right lung base atelectasis or infiltrate similar to prior radiograph. No pneumothorax. Stable cardiac silhouette. No acute osseous pathology. IMPRESSION: Small right pleural effusion and right lung base atelectasis or infiltrate. Electronically Signed   By: Vanetta Chou M.D.   On: 02/20/2024 18:48   DG Chest 2 View Result Date: 02/20/2024 EXAM: 2 VIEW(S) XRAY OF THE CHEST 02/20/2024 04:00:00 PM COMPARISON: None available. CLINICAL HISTORY: Shortness of breath with exertion for 5 days, non-alcoholic fatty liver, jaundice. FINDINGS: LUNGS AND PLEURA: Right lower lung opacity. Moderate right pleural effusion. No pneumothorax. HEART AND MEDIASTINUM: No acute abnormality of the cardiac and mediastinal silhouettes. BONES AND SOFT TISSUES: No acute osseous abnormality. IMPRESSION: 1. Moderate right pleural effusion and lower  lung opacity, new since previous . Electronically signed by: Dayne Hassell MD 02/20/2024 04:20 PM EDT RP Workstation: [id number]     Procedures   Medications Ordered in the ED  morphine  (PF) 4 MG/ML injection 4 mg (4 mg Intravenous Given 02/20/24 1914)  furosemide  (LASIX ) injection 40 mg (40 mg Intravenous Given 02/20/24 1915)  iohexol  (OMNIPAQUE ) 350 MG/ML injection 75 mL (75 mLs Intravenous Contrast Given 02/20/24 2021)  morphine  (PF) 4 MG/ML injection 4 mg (4 mg Intravenous Given 02/20/24 7662)    48 year old here for evaluation of right lower extremity swelling, CP SOB.  States has been ongoing issue for patient thought likely due to his psoriasis.  When seen at urgent care he noted he has had some right sided chest tightness over the last week, nonexertional, nonpleuritic in nature.  Chest x-ray which showed a right-sided pleural effusion.  Subsequent sent here for evaluation.  Patient notes he has a history of cardiomegaly had an echocardiogram in February which showed normal EF.  His history of EtOH dependence and subsequent cirrhosis.  Followed by [facility].  No nausea, vomiting, melena, bright red per rectum.  His right lower extremity does have some 1+ pitting edema to midshin.  No overlying erythema or warmth.  No obvious joint effusion.  He has an old scar to his medial malleolus however no obvious infectious process.  On Lasix , not helping symptoms.  Will plan on labs, imaging, reassess  Labs and imaging personally viewed and interpreted:  CBC without leukocytosis, hemoglobin 12.7 BMP with elevated LFTs, T. bili up to 4.0 from 2.7 at last GI visit Troponin 5--6 BNP 33 suspect low due to patient's body habitus. Chest x-ray with effusion X-ray right ankle with stable soft tissue edema, no joint effusion CTA with large right pleural effusion with compressive atelectasis   Patient given dose of Lasix  here.  Patient ambulated in room.  No hypoxia however tachypneic into high 30s  when walking more than 20 feet.  I suspect he likely has decompensated cirrhosis which is likely causing his fluid overload.  Will discuss for admission for diuresis.  With regards to his right lower extremity  pain he is no clinical evidence of VTE on exam however given his chest pain and shortness of breath could always consider ultrasound.  Ultrasound not available at this time.  He has good pulses hide low suspicion for ischemia.  He has no obvious infectious process imaging does not show joint effusion so I will suspicion for septic joint.  No erythema or warmth to suggest abscess, cellulitis.  Low suspicion for necrotizing infection.  While he does have ascites on exam he has no abdominal pain, no fever I have low suspicion for SBP.  Discussed with medicine team who evaluate patient for admission for decompensated cirrhosis  The patient appears reasonably stabilized for admission considering the current resources, flow, and capabilities available in the ED at this time, and I doubt any other [facility] requiring further screening and/or treatment in the ED prior to admission.  Clinical Course as of 02/20/24 2252  Sat Feb 20, 2024  2250 Dr. Keturah with medicine to evaluate for admission [BH]    Clinical Course User Index [BH] Makalyn Lennox A, PA-C                                 Medical Decision Making Amount and/or Complexity of Data Reviewed Independent Historian: parent External Data Reviewed: labs, radiology, ECG and notes. Labs: ordered. Decision-making details documented in ED Course. Radiology: ordered and independent interpretation performed. Decision-making details documented in ED Course. ECG/medicine tests: ordered and independent interpretation performed. Decision-making details documented in ED Course.  Risk OTC drugs. Prescription drug management. Parenteral controlled substances. Decision regarding hospitalization. Diagnosis or treatment significantly limited by social  determinants of health.       Final diagnoses:  Pleural effusion  Cirrhosis of liver with ascites, unspecified hepatic cirrhosis type (HCC)  Right leg pain  Psoriasis  ETOH abuse  Elevated LFTs  Ascites due to alcoholic cirrhosis [facility])    ED Discharge Orders     None          Abdulmalik Darco A, PA-C 02/20/24 2252    Elnor Bernarda SQUIBB, DO 02/29/24 [phone]

## 2024-02-20 NOTE — ED Notes (Addendum)
 Monitored pulse oximetry while ambulating patient 98ft with a cane. O2 levels remained between 96-98% & pulse rate 66-70bpm

## 2024-02-20 NOTE — ED Triage Notes (Signed)
 BIB family from UC. Seen at [facility] for sob and pedal swelling. UC sent after CXR. Mentions fluid on lungs. Denies pain. Alert, NAD, calm, interactive, resps e/u, speaking in clear complete sentences. Sitting in w/c.

## 2024-02-20 NOTE — ED Notes (Signed)
 Lab to add on BNP.

## 2024-02-20 NOTE — ED Triage Notes (Signed)
 Pt c/o right ankle pain about 3.5 weeks ago. He was seen at [facility] on 8/29. Pt states pain was better with steroid but has returned.   He states he thinks pain is due to plaque psoriasis. He had to get new dermatologist but wont be able to see them till November.

## 2024-02-21 ENCOUNTER — Observation Stay (HOSPITAL_COMMUNITY)

## 2024-02-21 DIAGNOSIS — J90 Pleural effusion, not elsewhere classified: Secondary | ICD-10-CM | POA: Diagnosis not present

## 2024-02-21 DIAGNOSIS — R188 Other ascites: Secondary | ICD-10-CM | POA: Diagnosis not present

## 2024-02-21 DIAGNOSIS — Z48813 Encounter for surgical aftercare following surgery on the respiratory system: Secondary | ICD-10-CM | POA: Diagnosis not present

## 2024-02-21 DIAGNOSIS — R918 Other nonspecific abnormal finding of lung field: Secondary | ICD-10-CM | POA: Diagnosis not present

## 2024-02-21 DIAGNOSIS — R140 Abdominal distension (gaseous): Secondary | ICD-10-CM | POA: Diagnosis not present

## 2024-02-21 DIAGNOSIS — J948 Other specified pleural conditions: Secondary | ICD-10-CM | POA: Diagnosis not present

## 2024-02-21 DIAGNOSIS — L4050 Arthropathic psoriasis, unspecified: Secondary | ICD-10-CM | POA: Diagnosis not present

## 2024-02-21 DIAGNOSIS — J918 Pleural effusion in other conditions classified elsewhere: Secondary | ICD-10-CM | POA: Diagnosis not present

## 2024-02-21 DIAGNOSIS — K769 Liver disease, unspecified: Secondary | ICD-10-CM | POA: Diagnosis not present

## 2024-02-21 DIAGNOSIS — M25571 Pain in right ankle and joints of right foot: Secondary | ICD-10-CM | POA: Diagnosis not present

## 2024-02-21 LAB — ALBUMIN, PLEURAL OR PERITONEAL FLUID: Albumin, Fluid: <1.5 g/dL

## 2024-02-21 LAB — BODY FLUID CELL COUNT WITH DIFFERENTIAL
Eos, Fluid: 1 %
Lymphs, Fluid: 73 %
Monocyte-Macrophage-Serous Fluid: 8 % — ABNORMAL LOW (ref 50–90)
Neutrophil Count, Fluid: 18 % (ref 0–25)
Total Nucleated Cell Count, Fluid: 674 uL (ref 0–1000)

## 2024-02-21 LAB — CBC
HCT: 35.9 % — ABNORMAL LOW (ref 39.0–52.0)
Hemoglobin: 12 g/dL — ABNORMAL LOW (ref 13.0–17.0)
MCH: 32 pg (ref 26.0–34.0)
MCHC: 33.4 g/dL (ref 30.0–36.0)
MCV: 95.7 fL (ref 80.0–100.0)
Platelets: 123 K/uL — ABNORMAL LOW (ref 150–400)
RBC: 3.75 MIL/uL — ABNORMAL LOW (ref 4.22–5.81)
RDW: 16.1 % — ABNORMAL HIGH (ref 11.5–15.5)
WBC: 4.2 K/uL (ref 4.0–10.5)
nRBC: 0 % (ref 0.0–0.2)

## 2024-02-21 LAB — PROTEIN, PLEURAL OR PERITONEAL FLUID: Total protein, fluid: <3 g/dL

## 2024-02-21 LAB — LACTATE DEHYDROGENASE, PLEURAL OR PERITONEAL FLUID: LD, Fluid: 50 U/L — ABNORMAL HIGH (ref 3–23)

## 2024-02-21 LAB — BASIC METABOLIC PANEL WITH GFR
Anion gap: 7 (ref 5–15)
BUN: 7 mg/dL (ref 6–20)
CO2: 28 mmol/L (ref 22–32)
Calcium: 8.4 mg/dL — ABNORMAL LOW (ref 8.9–10.3)
Chloride: 103 mmol/L (ref 98–111)
Creatinine, Ser: 0.65 mg/dL (ref 0.61–1.24)
GFR, Estimated: >60 mL/min (ref >60)
Glucose, Bld: 97 mg/dL (ref 70–99)
Potassium: 3.7 mmol/L (ref 3.5–5.1)
Sodium: 138 mmol/L (ref 135–145)

## 2024-02-21 LAB — TSH: TSH: 3.261 u[IU]/mL (ref 0.350–4.500)

## 2024-02-21 LAB — MAGNESIUM: Magnesium: 1.7 mg/dL (ref 1.7–2.4)

## 2024-02-21 LAB — URIC ACID: Uric Acid, Serum: 3.6 mg/dL — ABNORMAL LOW (ref 3.7–8.6)

## 2024-02-21 LAB — PHOSPHORUS: Phosphorus: 4 mg/dL (ref 2.5–4.6)

## 2024-02-21 LAB — HIV ANTIBODY (ROUTINE TESTING W REFLEX): HIV Screen 4th Generation wRfx: NONREACTIVE

## 2024-02-21 MED ORDER — PREDNISONE 10 MG (21) PO TBPK: 20.0000 mg | ORAL_TABLET | Freq: Every morning | ORAL | Status: DC

## 2024-02-21 MED ORDER — OXYCODONE HCL 5 MG PO TABS
5.0000 mg | ORAL_TABLET | Freq: Once | ORAL | Status: AC
  Administered 2024-02-21: 5 mg via ORAL
  Filled 2024-02-21: qty 1

## 2024-02-21 MED ORDER — PREDNISONE 10 MG (21) PO TBPK: 10.0000 mg | ORAL_TABLET | ORAL | Status: DC

## 2024-02-21 MED ORDER — OXYCODONE HCL 5 MG PO TABS
5.0000 mg | ORAL_TABLET | Freq: Four times a day (QID) | ORAL | Status: DC | PRN
  Administered 2024-02-21 – 2024-02-24 (×10): 5 mg via ORAL
  Filled 2024-02-21 (×11): qty 1

## 2024-02-21 MED ORDER — PREDNISONE 10 MG (21) PO TBPK: 20.0000 mg | ORAL_TABLET | Freq: Every evening | ORAL | Status: DC

## 2024-02-21 MED ORDER — PREDNISONE 10 MG (21) PO TBPK: 10.0000 mg | ORAL_TABLET | Freq: Four times a day (QID) | ORAL | Status: DC

## 2024-02-21 MED ORDER — MORPHINE SULFATE (PF) 2 MG/ML IV SOLN: 2.0000 mg | INTRAVENOUS | Status: DC | PRN

## 2024-02-21 MED ORDER — LIDOCAINE HCL (PF) 1 % IJ SOLN
10.0000 mL | Freq: Once | INTRAMUSCULAR | Status: AC
  Administered 2024-02-21: 10 mL

## 2024-02-21 MED ORDER — PREDNISONE 10 MG (21) PO TBPK: 10.0000 mg | ORAL_TABLET | Freq: Three times a day (TID) | ORAL | Status: DC

## 2024-02-21 MED ORDER — METHYLPREDNISOLONE SODIUM SUCC 40 MG IJ SOLR
40.0000 mg | Freq: Two times a day (BID) | INTRAMUSCULAR | Status: AC
  Administered 2024-02-21 – 2024-02-22 (×2): 40 mg via INTRAVENOUS
  Filled 2024-02-21 (×2): qty 1

## 2024-02-21 MED ORDER — CLOBETASOL PROPIONATE 0.05 % EX CREA
TOPICAL_CREAM | Freq: Two times a day (BID) | CUTANEOUS | Status: DC
  Administered 2024-02-22: 1 via TOPICAL
  Filled 2024-02-21 (×2): qty 15

## 2024-02-21 NOTE — Hospital Course (Signed)
 Thomas [name] is a 48 y.o. male with a history of decompensated alcoholic cirrhosis with ascites, esophageal varices, splenomegaly, psoriasis.  Patient presented secondary to worsening right ankle pain with associated swelling in addition to shortness of breath secondary to right-sided pleural effusion. Thoracentesis ordered and orthopedic surgery consulted.

## 2024-02-21 NOTE — Plan of Care (Signed)
?  Problem: Education: ?Goal: Knowledge of General Education information will improve ?Description: Including pain rating scale, medication(s)/side effects and non-pharmacologic comfort measures ?Outcome: Progressing ?  ?Problem: Health Behavior/Discharge Planning: ?Goal: Ability to manage health-related needs will improve ?Outcome: Progressing ?  ?Problem: Clinical Measurements: ?Goal: Ability to maintain clinical measurements within normal limits will improve ?Outcome: Progressing ?Goal: Will remain free from infection ?Outcome: Progressing ?Goal: Diagnostic test results will improve ?Outcome: Progressing ?Goal: Respiratory complications will improve ?Outcome: Progressing ?Goal: Cardiovascular complication will be avoided ?Outcome: Progressing ?  ?Problem: Activity: ?Goal: Risk for activity intolerance will decrease ?Outcome: Progressing ?  ?Problem: Nutrition: ?Goal: Adequate nutrition will be maintained ?Outcome: Progressing ?  ?Problem: Elimination: ?Goal: Will not experience complications related to bowel motility ?Outcome: Progressing ?Goal: Will not experience complications related to urinary retention ?Outcome: Progressing ?  ?Problem: Coping: ?Goal: Level of anxiety will decrease ?Outcome: Progressing ?  ?

## 2024-02-21 NOTE — Procedures (Signed)
 PROCEDURE SUMMARY:  Successful US  guided right thoracentesis. Yielded of amber fluid. Patient tolerated procedure well. No immediate complications. EBL = trace  Specimen sent for labs.  Post procedure chest X-ray reveals no pneumothorax  Meaghen Vecchiarelli CHRISTELLA Bal PA-C 02/21/2024 10:28 AM

## 2024-02-21 NOTE — Plan of Care (Signed)

## 2024-02-21 NOTE — Progress Notes (Signed)
 PROGRESS NOTE    Thomas [name]  FMW:[id number] DOB: [birth year] DOA: 02/20/2024 PCP: Berneta Elsie Sayre, MD   Brief Narrative: Thomas [name] is a 48 y.o. male with a history of decompensated alcoholic cirrhosis with ascites, esophageal varices, splenomegaly, psoriasis.  Patient presented secondary to worsening right ankle pain with associated swelling in addition to shortness of breath secondary to right-sided pleural effusion. Thoracentesis ordered and orthopedic surgery consulted.   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 Associated dyspnea. No hypoxia. IR consulted and performed a thoracentesis on 9/13, yielding 650 mL of amber fluid. Labs pending. -Follow-up pleural fluid labs, add-on bilirubin -Follow-up pleural fluid cytology -Follow-up repeat chest x-ray  Decompensated alcoholic cirrhosis Esophageal varices Portal hypertension Noted. Patient with persistently elevated bilirubin. Ascites noted on imaging. MELD-Na of 17. Diuretic therapy started. -Continue Lasix  and spironolactone  -Continue nadolol  -Continue Protonix   Right ankle pain/swelling Concern for psoriatic etiology. History of ankle surgery with hardware in place. X-ray on admission without complicated findings. Orthopedic surgery consulted and per their assessment, unlikely infection. -Orthopedic surgery recommendations: prednisone  with taper -Patient will need to follow-up with dermatology  Alcohol use disorder Noted. Continues to drink socially. Patient counseled on admission with regard to needing complete abstinence from alcohol consumption.  Splenomegaly Thrombocytopenia Related to cirrhosis. Noted.  Psoriasis Patient with a skin flare. Patient is not on maintenance therapy. He was previously seen by dermatology, but is between physicians at this time. Clobetasol  started for management of skin rashes. -Continue clobetasol  cream   DVT prophylaxis: SCDs Code  Status:   Code Status: Full Code Family Communication: Father at bedside Disposition Plan: Discharge home likely in 1-2 days pending improvement of ankle swelling/pain and stable respiratory status   Consultants:  Orthopedic surgery  Procedures:  Thoracentesis  Antimicrobials: None    Subjective: Patient reports ongoing right ankle pain. Breathing is better.  Objective: BP 122/74 (BP Location: Right Arm)   Pulse 77   Temp 98.7 F (37.1 C) (Oral)   Resp 18   Ht 5' 5 (1.651 m)   Wt 117.5 kg   SpO2 92%   BMI 43.11 kg/m   Examination:  General exam: Appears calm and comfortable Respiratory system: Clear to auscultation. Respiratory effort normal. Cardiovascular system: S1 & S2 heard, Normal rate with regular rhythm. Gastrointestinal system: Abdomen is nondistended, soft and nontender. Normal bowel sounds heard. Central nervous system: Alert and oriented. No focal neurological deficits. Musculoskeletal: Significant right ankle edema with significant tenderness Skin: dry plaques noted on right lower extremity Psychiatry: Judgement and insight appear normal. Mood & affect appropriate.    Data Reviewed: I have personally reviewed following labs and imaging studies  CBC Lab Results  Component Value Date   WBC 4.2 02/21/2024   RBC 3.75 (L) 02/21/2024   HGB 12.0 (L) 02/21/2024   HCT 35.9 (L) 02/21/2024   MCV 95.7 02/21/2024   MCH 32.0 02/21/2024   PLT 123 (L) 02/21/2024   MCHC 33.4 02/21/2024   RDW 16.1 (H) 02/21/2024   LYMPHSABS 0.6 (L) 02/05/2024   MONOABS 0.4 02/03/2023   EOSABS 0.4 02/05/2024   BASOSABS 0.0 02/05/2024     Last metabolic panel Lab Results  Component Value Date   NA 138 02/21/2024   K 3.7 02/21/2024   CL 103 02/21/2024   CO2 28 02/21/2024   BUN 7 02/21/2024   CREATININE 0.65 02/21/2024   GLUCOSE 97 02/21/2024   GFRNONAA >60 02/21/2024   GFRAA >89 06/22/2013   CALCIUM 8.4 (  L) 02/21/2024   PHOS 4.0 02/21/2024   PROT 6.3 (L)  02/20/2024   ALBUMIN  2.2 (L) 02/20/2024   BILITOT 4.0 (H) 02/20/2024   ALKPHOS 203 (H) 02/20/2024   AST 68 (H) 02/20/2024   ALT 37 02/20/2024   ANIONGAP 7 02/21/2024    GFR: Estimated Creatinine Clearance: 136.9 mL/min (by C-G formula based on SCr of 0.65 mg/dL).  No results found for this or any previous visit (from the past 240 hours).    Radiology Studies: US  Abdomen Limited Result Date: 02/21/2024 CLINICAL DATA:  Abdominal distension EXAM: LIMITED ABDOMEN ULTRASOUND FOR ASCITES TECHNIQUE: Limited ultrasound survey for ascites was performed in all four abdominal quadrants. COMPARISON:  None Available. FINDINGS: Scanning in all 4 quadrants shows evidence mild ascites. Prominent right pleural effusion is again noted. IMPRESSION: Mild ascites. Large right-sided pleural effusion. Electronically Signed   By: Oneil Devonshire M.D.   On: 02/21/2024 01:17   CT Angio Chest PE W and/or Wo Contrast Result Date: 02/20/2024 CLINICAL DATA:  Concern for pulmonary embolism. EXAM: CT ANGIOGRAPHY CHEST WITH CONTRAST TECHNIQUE: Multidetector CT imaging of the chest was performed using the standard protocol during bolus administration of intravenous contrast. Multiplanar CT image reconstructions and MIPs were obtained to evaluate the vascular anatomy. RADIATION DOSE REDUCTION: This exam was performed according to the departmental dose-optimization program which includes automated exposure control, adjustment of the mA and/or kV according to patient size and/or use of iterative reconstruction technique. CONTRAST:  75mL OMNIPAQUE  IOHEXOL  350 MG/ML SOLN COMPARISON:  Chest radiograph dated 02/20/2024. FINDINGS: Evaluation of this exam is limited due to respiratory motion. Cardiovascular: Top-normal cardiac size. No pericardial effusion. The thoracic aorta is unremarkable. The origins of the great vessels of the aortic arch appear patent. Evaluation of the pulmonary arteries is limited due to respiratory motion. No  central pulmonary artery embolus identified. Mediastinum/Nodes: No hilar adenopathy. The esophagus is grossly unremarkable no mediastinal fluid collection. Lungs/Pleura: Large right pleural effusion. There is complete consolidation of the right lower lobe and partial consolidation of the right middle lobe which may represent atelectasis. A pneumonia or underlying mass is not excluded. Clinical correlation and follow-up to resolution recommended. Tiny left upper lobe calcified granuloma. No pneumothorax. The central airways are patent. Upper Abdomen: Cirrhosis, ascites, and splenomegaly. Musculoskeletal: No acute osseous pathology. Review of the MIP images confirms the above findings. IMPRESSION: 1. No CT evidence of central pulmonary artery embolus. 2. Large right pleural effusion with compressive atelectasis of the right lower and right middle lobes. Pneumonia or underlying mass are not excluded. Clinical correlation and follow-up to resolution recommended. 3. Cirrhosis, ascites, and splenomegaly. Electronically Signed   By: Vanetta Chou M.D.   On: 02/20/2024 20:35   DG Ankle Complete Right Result Date: 02/20/2024 CLINICAL DATA:  Right ankle pain for 3.5 weeks EXAM: RIGHT ANKLE - COMPLETE 3+ VIEW COMPARISON:  02/05/2024 FINDINGS: Frontal, oblique, and lateral views of the right ankle are obtained. Postsurgical changes are seen from prior bimalleolar fracture repair. No acute fracture, subluxation, or dislocation. Mild osteoarthritis of the ankle, hindfoot, and midfoot. Mild diffuse subcutaneous edema unchanged. IMPRESSION: 1. Stable soft tissue edema. 2. No acute bony abnormality. Electronically Signed   By: Ozell Daring M.D.   On: 02/20/2024 19:40   DG Chest 2 View Result Date: 02/20/2024 CLINICAL DATA:  Shortness of breath. EXAM: CHEST - 2 VIEW COMPARISON:  Chest radiograph dated 02/22/2024. FINDINGS: Small right pleural effusion and right lung base atelectasis or infiltrate similar to prior  radiograph. No  pneumothorax. Stable cardiac silhouette. No acute osseous pathology. IMPRESSION: Small right pleural effusion and right lung base atelectasis or infiltrate. Electronically Signed   By: Vanetta Chou M.D.   On: 02/20/2024 18:48   DG Chest 2 View Result Date: 02/20/2024 EXAM: 2 VIEW(S) XRAY OF THE CHEST 02/20/2024 04:00:00 PM COMPARISON: None available. CLINICAL HISTORY: Shortness of breath with exertion for 5 days, non-alcoholic fatty liver, jaundice. FINDINGS: LUNGS AND PLEURA: Right lower lung opacity. Moderate right pleural effusion. No pneumothorax. HEART AND MEDIASTINUM: No acute abnormality of the cardiac and mediastinal silhouettes. BONES AND SOFT TISSUES: No acute osseous abnormality. IMPRESSION: 1. Moderate right pleural effusion and lower lung opacity, new since previous . Electronically signed by: Katheleen Faes MD 02/20/2024 04:20 PM EDT RP Workstation: [id number]      LOS: 0 days    Elgin Lam, MD [facility] 02/21/2024, 10:30 AM   If 7PM-7AM, please contact night-coverage [email or web address]

## 2024-02-21 NOTE — Evaluation (Signed)
 Physical Therapy Evaluation Patient Details Name: Thomas [name] MRN: [id number] DOB: 1975/11/14 Today's Date: 02/21/2024  History of Present Illness  Thomas [name] is a 48 y.o. male who presented to [facility] ED 02/20/24 from UC for right ankle pain, difficulty ambulating, and SOB with large R pleural effusion. PMHx: decompensated alcoholic cirrhosis with ascites, EV, splenomegaly, psoriasis, obesity, sleep apnea, psoriatic arthritis, portal hypertensive gastropathy, esophageal and gastric varices, Right ankle ORIF (12/28/2022).   Clinical Impression  Pt admitted with above diagnosis. PTA, pt was independent with functional mobility, ADLs, IADLs, and driving. He lives with his dad in a two story house with 3 STE. Pt's bed/bath is located upstairs which is a flight with unilateral railing and one landing. Pt currently with functional limitations due to the deficits listed below (see PT Problem List). He performed bed mobility with modI and transfers with modI using RW. Pt required supervision for gait using RW and stairs. He opted to maintain RLE NWB at all times d/t pain. Educated pt on stair training in accordance with his home set-up. He completed 3 steps by scooting up/down. Pt declined to practice other methods. Pt will benefit from acute skilled PT to increase his independence and safety with mobility to allow discharge. He appears to be near baseline function, no follow-up PT needed. Encouraged pt to continue to perform HEP from his right ankle surgery and increase weight-bearing as able.      If plan is discharge home, recommend the following: A little help with walking and/or transfers;Help with stairs or ramp for entrance;Assist for transportation   Can travel by private vehicle        Equipment Recommendations Rolling walker (2 wheels)  Recommendations for Other Services       Functional Status Assessment Patient has had a recent decline in their functional status and demonstrates the ability  to make significant improvements in function in a reasonable and predictable amount of time.     Precautions / Restrictions Precautions Precautions: Fall Recall of Precautions/Restrictions: Intact Restrictions Weight Bearing Restrictions Per Provider Order: No      Mobility  Bed Mobility Overal bed mobility: Modified Independent             General bed mobility comments: Pt performed supine<>sit with HOB elevated. No use of bed rails or cues for sequencing.    Transfers Overall transfer level: Modified independent Equipment used: Rolling walker (2 wheels)               General transfer comment: Pt demonstrated proper hand placement using RW. Powered up without physical assist. Good eccentric control.    Ambulation/Gait Ambulation/Gait assistance: Supervision Gait Distance (Feet): 100 Feet Assistive device: Rolling walker (2 wheels) Gait Pattern/deviations: Step-to pattern, Trunk flexed Gait velocity: decreased Gait velocity interpretation: <1.8 ft/sec, indicate of risk for recurrent falls   General Gait Details: Pt opted to maintain RLE NWB at all times d/t pain. He utilized a hopping technique with heavy dependence on BUE support on RW. Pt had slight fwd lean, cued proximity to RW.  Stairs Stairs: Yes Stairs assistance: Supervision Stair Management: No rails, Seated/boosting Number of Stairs: 3 General stair comments: Educated pt on how to ascend/descend stairs in accordace to his home set-up. Demonstrated backwards using RW or sideways with BUE support on railing. Pt opted for seated, scooting on his bottom using BUE and LLE to advance up/down. Pt declined to practice any other techniques.  Wheelchair Mobility     Tilt Bed  Modified Rankin (Stroke Patients Only)       Balance Overall balance assessment: Mild deficits observed, not formally tested                                           Pertinent Vitals/Pain Pain  Assessment Pain Assessment: 0-10 Pain Score: 9  Pain Location: R ankle Pain Descriptors / Indicators: Grimacing, Aching Pain Intervention(s): Monitored during session, Limited activity within patient's tolerance, Repositioned    Home Living Family/patient expects to be discharged to:: Private residence Living Arrangements: Parent (Dad) Available Help at Discharge: Family;Available 24 hours/day Type of Home: House Home Access: Stairs to enter Entrance Stairs-Rails: Right Entrance Stairs-Number of Steps: 3 Alternate Level Stairs-Number of Steps: Flight Home Layout: Two level;Bed/bath upstairs Home Equipment: Cane - single point;Tub bench      Prior Function Prior Level of Function : Independent/Modified Independent;Driving             Mobility Comments: Indep no AD. Denies falls in the past 11mo. ADLs Comments: Indep with ADL/IADLs. Pt utilizes tub bench. Enjoys dancing. Not working since May, laid off. Drives     Extremity/Trunk Assessment   Upper Extremity Assessment Upper Extremity Assessment: Overall WFL for tasks assessed;Right hand dominant    Lower Extremity Assessment Lower Extremity Assessment: RLE deficits/detail RLE Deficits / Details: Hip and Knee PROM WFL, AROM limited d/t pain. Hip flex 4/5, Knee ext/flex grossly 3/5 did not resist d/t pain with movement. Pt able to achieve ~5deg of ankle DF and ~10deg of ankle PF, but painful. Did not resist ankle, grossly 3-/5. RLE: Unable to fully assess due to pain RLE Sensation: decreased proprioception RLE Coordination: decreased gross motor    Cervical / Trunk Assessment Cervical / Trunk Assessment: Other exceptions Cervical / Trunk Exceptions: Body Habitus  Communication   Communication Communication: No apparent difficulties    Cognition Arousal: Alert Behavior During Therapy: WFL for tasks assessed/performed   PT - Cognitive impairments: No apparent impairments                       PT -  Cognition Comments: Pt A,Ox4 Following commands: Intact       Cueing Cueing Techniques: Verbal cues     General Comments General comments (skin integrity, edema, etc.): VSS on RA. Right ankle with mild edema.  Psoriatic plaques over anterior lower leg.    Exercises     Assessment/Plan    PT Assessment Patient needs continued PT services  PT Problem List Decreased strength;Decreased range of motion;Decreased activity tolerance;Decreased balance;Decreased mobility;Pain       PT Treatment Interventions DME instruction;Gait training;Stair training;Functional mobility training;Therapeutic activities;Therapeutic exercise;Balance training;Patient/family education    PT Goals (Current goals can be found in the Care Plan section)  Acute Rehab PT Goals Patient Stated Goal: Return Home PT Goal Formulation: With patient/family Time For Goal Achievement: 03/06/24 Potential to Achieve Goals: Good    Frequency Min 2X/week     Co-evaluation               AM-PAC PT 6 Clicks Mobility  Outcome Measure Help needed turning from your back to your side while in a flat bed without using bedrails?: None Help needed moving from lying on your back to sitting on the side of a flat bed without using bedrails?: None Help needed moving to and from a bed to a  chair (including a wheelchair)?: A Little Help needed standing up from a chair using your arms (e.g., wheelchair or bedside chair)?: None Help needed to walk in hospital room?: A Little Help needed climbing 3-5 steps with a railing? : A Little 6 Click Score: 21    End of Session Equipment Utilized During Treatment: Gait belt Activity Tolerance: Patient tolerated treatment well;Patient limited by fatigue Patient left: in bed;with call bell/phone within reach;with family/visitor present Nurse Communication: Mobility status PT Visit Diagnosis: Difficulty in walking, not elsewhere classified (R26.2);Pain Pain - Right/Left: Right Pain -  part of body: Ankle and joints of foot    Time: 1423-1443 PT Time Calculation (min) (ACUTE ONLY): 20 min   Charges:   PT Evaluation $PT Eval Low Complexity: 1 Low PT Treatments $Gait Training: 8-22 mins PT General Charges $$ ACUTE PT VISIT: 1 Visit         Randall SAUNDERS, PT, DPT Acute Rehabilitation Services Office: [phone] Secure Chat Preferred  Delon CHRISTELLA Callander 02/21/2024, 3:00 PM

## 2024-02-21 NOTE — Consult Note (Addendum)
 ORTHOPAEDIC CONSULTATION  REQUESTING PHYSICIAN: Briana Elgin LABOR, MD  Chief Complaint: Right ankle pain  HPI: Thomas [name] is a 48 y.o. male with history of plaque psoriasis, psoriatic arthritis alcoholic cirrhosis, alcoholic fatty liver, morbid obesity esophageal varices who complains of right ankle pain. Doing well from his right ankle ORIF that was performed 12/28/22.  Like he was finally able to rehab this and get back to normal activity.  However he has now started to have increased pain.  Pain worsened about 3 weeks ago.  He was seen at urgent care on 02/05/2024 and was given an intramuscular Solu-Medrol  injection as well as a Medrol  Dosepak to go home with. This took away his pain completely but then about a week later his pain came back.  He had been seeing a rheumatologist for his psoriasis and had flareups of psoriatic arthritis in the past which has generally affected his fingers elbows or back.  He was previously on [employer] however has not been able to resume medication since his rheumatologist closed their practice.  He has an upcoming appointment with a new rheumatologist but this is not until October.  Today he states right ankle pain is severe worsened with movement and better with rest and pain medication.  He has not noticed any changes in his surgical wounds.  No recent injuries to ankle. denies paresthesias.  No fevers or chills.  No nausea or vomiting.  Past Medical History:  Diagnosis Date   Acute conjunctivitis of both eyes 10/22/2021   Acute sinusitis 06/16/2013   Alcoholic cirrhosis (HCC)    Alcoholic fatty liver 01/16/2010   Needs final HBV and HAV vaccines on or after 10/25/2012    Alcoholism (HCC) 12/25/2011   Allergic rhinitis    Childhood asthma    Elevated transaminase level 06/10/2007   AST: 80 ALT: 136 in 8/11: Hepatitis A., B and C negative.    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    Sleep apnea    Past Surgical History:  Procedure Laterality Date   ESOPHAGEAL BANDING  12/26/2022   Procedure: ESOPHAGEAL BANDING;  Surgeon: Shila Gustav GAILS, MD;  Location: [facility];  Service: Gastroenterology;;   ESOPHAGOGASTRODUODENOSCOPY (EGD) WITH PROPOFOL  N/A 07/05/2022   Procedure: ESOPHAGOGASTRODUODENOSCOPY (EGD) WITH PROPOFOL ;  Surgeon: Shila Gustav GAILS, MD;  Location: [facility];  Service: Gastroenterology;  Laterality: N/A;   ESOPHAGOGASTRODUODENOSCOPY (EGD) WITH PROPOFOL  N/A 12/26/2022   Procedure: ESOPHAGOGASTRODUODENOSCOPY (EGD) WITH PROPOFOL ;  Surgeon: Shila Gustav GAILS, MD;  Location: [facility];  Service: Gastroenterology;  Laterality: N/A;   ESOPHAGOGASTRODUODENOSCOPY (EGD) WITH PROPOFOL  N/A 02/12/2023   Procedure: ESOPHAGOGASTRODUODENOSCOPY (EGD) WITH PROPOFOL ;  Surgeon: Shila Gustav GAILS, MD;  Location: [facility];  Service: Gastroenterology;  Laterality: N/A;   ORIF ANKLE FRACTURE Right 12/28/2022   Procedure: OPEN REDUCTION INTERNAL FIXATION (ORIF) ANKLE FRACTURE;  Surgeon: Josefina Chew, MD;  Location: [facility] OR;  Service: Orthopedics;  Laterality: Right;   Social History   Socioeconomic History   Marital status: Single    Spouse name: Not on file   Number of children: 0   Years of education: Not on file   Highest education level: 12th grade  Occupational History   Not on file  Tobacco Use   Smoking status: Some Days    Types: Cigars   Smokeless  tobacco: Never   Tobacco comments:    Cigars occasional  Vaping Use   Vaping status: Never Used  Substance and Sexual Activity   Alcohol use: Not Currently    Comment: Sober since November 2024   Drug use: Never    Comment: history of cocaine abuse   Sexual activity: Yes  Other Topics Concern   Not on file  Social History Narrative   Manufacturing systems engineer.  Single. Gets regular  exercise.             Social Drivers of Corporate investment banker Strain: Low Risk  (11/26/2023)   Overall Financial Resource Strain (CARDIA)    Difficulty of Paying Living Expenses: Not hard at all  Food Insecurity: No Food Insecurity (02/21/2024)   Hunger Vital Sign    Worried About Running Out of Food in the Last Year: Never true    Ran Out of Food in the Last Year: Never true  Transportation Needs: No Transportation Needs (02/21/2024)   PRAPARE - Administrator, Civil Service (Medical): No    Lack of Transportation (Non-Medical): No  Physical Activity: Insufficiently Active (11/26/2023)   Exercise Vital Sign    Days of Exercise per Week: 1 day    Minutes of Exercise per Session: 10 min  Stress: No Stress Concern Present (11/26/2023)   [employer] of Occupational Health - Occupational Stress Questionnaire    Feeling of Stress: Not at all  Social Connections: Socially Isolated (11/26/2023)   Social Connection and Isolation Panel    Frequency of Communication with Friends and Family: More than three times a week    Frequency of Social Gatherings with Friends and Family: More than three times a week    Attends Religious Services: Patient declined    Database administrator or Organizations: No    Attends Engineer, structural: Not on file    Marital Status: Never married   Family History  Problem Relation Age of Onset   Liver disease Mother    Depression Mother    Liver disease Father    Diabetes Father    Hypertension Father    Liver disease Sister    Diabetes Other    Colon cancer Neg Hx    Esophageal cancer Neg Hx    Stomach cancer Neg Hx    Colon polyps Neg Hx    Allergies  Allergen Reactions   Codeine Other (See Comments)    Jittery      Positive ROS: All other systems have been reviewed and were otherwise negative with the exception of those mentioned in the HPI and as above.  Physical Exam: General: Alert, no acute  distress Cardiovascular: No pedal edema Respiratory: No cyanosis, no use of accessory musculature GI: No organomegaly, abdomen is soft and non-tender Skin: No lesions in the area of chief complaint Neurologic: Sensation intact distally Psychiatric: Patient is competent for consent with normal mood and affect Lymphatic: No axillary or cervical lymphadenopathy  MUSCULOSKELETAL: Sensation diffusely intact at the ankle foot and toes.  Able to dorsiflex and plantarflex approximately 10 degrees however this does cause pain.  Tender to palpation over medial and lateral malleoli and anterior joint line.  Mild edema.  Psoriatic plaques over anterior lower leg.  Well-healed surgical wounds, no signs of infection.  2+ DP pulse.         Imaging: 3 views of the right ankle taken yesterday shows no abnormalities.  Status post ORIF.  Assessment: Right  ankle pain in patient with previous right ankle ORIF with history of alcoholic cirrhosis and currently untreated psoriatic arthritis  Plan: - Will hold off on further imaging at this time I do not think MRI or CT scan will show us  much with his current hardware.   - Overall clinically he does not look to be infected.  I am suspicious that this is more psoriatic arthritis flare than an infected right ankle. Plan to start with steroid for arthritis flare, will reserve antibiotics and/or aspiration for failure to improve.  - removed NPO order and resumed 2g sodium diet that had been ordered, no surgical plan at this time - patient will need outpatient rheumatology follow up on discharge, defer DMARD or biologic while inpatient to medicine - will continue to follow  Army MARLA Daring, PA-C   02/21/2024 12:53 PM  Reviewed, discussed, agree with above.  The ORIF looks good, unlikely sepsis, but will follow.    Fonda SHAUNNA Olmsted, MD

## 2024-02-22 ENCOUNTER — Observation Stay (HOSPITAL_COMMUNITY)

## 2024-02-22 DIAGNOSIS — L4050 Arthropathic psoriasis, unspecified: Secondary | ICD-10-CM | POA: Diagnosis not present

## 2024-02-22 DIAGNOSIS — K769 Liver disease, unspecified: Secondary | ICD-10-CM | POA: Diagnosis not present

## 2024-02-22 DIAGNOSIS — J918 Pleural effusion in other conditions classified elsewhere: Secondary | ICD-10-CM | POA: Diagnosis not present

## 2024-02-22 DIAGNOSIS — J948 Other specified pleural conditions: Secondary | ICD-10-CM | POA: Diagnosis not present

## 2024-02-22 DIAGNOSIS — M25571 Pain in right ankle and joints of right foot: Secondary | ICD-10-CM | POA: Diagnosis not present

## 2024-02-22 DIAGNOSIS — J90 Pleural effusion, not elsewhere classified: Secondary | ICD-10-CM | POA: Diagnosis not present

## 2024-02-22 DIAGNOSIS — R918 Other nonspecific abnormal finding of lung field: Secondary | ICD-10-CM | POA: Diagnosis not present

## 2024-02-22 LAB — HEPATIC FUNCTION PANEL
ALT: 36 U/L (ref 0–44)
AST: 55 U/L — ABNORMAL HIGH (ref 15–41)
Albumin: 2.2 g/dL — ABNORMAL LOW (ref 3.5–5.0)
Alkaline Phosphatase: 199 U/L — ABNORMAL HIGH (ref 38–126)
Bilirubin, Direct: 1.2 mg/dL — ABNORMAL HIGH (ref 0.0–0.2)
Indirect Bilirubin: 2.9 mg/dL — ABNORMAL HIGH (ref 0.3–0.9)
Total Bilirubin: 4.1 mg/dL — ABNORMAL HIGH (ref 0.0–1.2)
Total Protein: 7 g/dL (ref 6.5–8.1)

## 2024-02-22 MED ORDER — METHYLPREDNISOLONE SODIUM SUCC 40 MG IJ SOLR
40.0000 mg | Freq: Two times a day (BID) | INTRAMUSCULAR | Status: AC
  Administered 2024-02-22 – 2024-02-23 (×2): 40 mg via INTRAVENOUS
  Filled 2024-02-22 (×2): qty 1

## 2024-02-22 MED ORDER — ENOXAPARIN SODIUM 60 MG/0.6ML IJ SOSY
60.0000 mg | PREFILLED_SYRINGE | INTRAMUSCULAR | Status: DC
  Administered 2024-02-22 – 2024-02-23 (×2): 60 mg via SUBCUTANEOUS
  Filled 2024-02-22 (×2): qty 0.6

## 2024-02-22 NOTE — Progress Notes (Signed)
 PROGRESS NOTE    Thomas [name]  FMW:[id number] DOB: 03-18-1976 DOA: 02/20/2024 PCP: Berneta Elsie Sayre, MD   Brief Narrative: Thomas [name] is a 48 y.o. male with a history of decompensated alcoholic cirrhosis with ascites, esophageal varices, splenomegaly, psoriasis. Patient presented secondary to worsening right ankle pain with associated swelling in addition to shortness of breath secondary to right-sided pleural effusion. Thoracentesis ordered and orthopedic surgery consulted.    Assessment and Plan: Right-sided pleural effusion Likely hepatic hydrothorax IR consulted and performed a thoracentesis on 9/13, yielding 650 mL of amber fluid, transudative Repeat chest x-ray showed hazy right lung base opacity compatible with pleural effusion and atelectasis associated with hepatic disorder Follow-up pleural fluid cytology  Decompensated alcoholic cirrhosis Esophageal varices Portal hypertension Patient with persistently elevated bilirubin Ascites noted on imaging. MELD-Na of 17 Continue Lasix  and spironolactone  Continue nadolol  Continue Protonix   Right ankle pain/swelling Concern for psoriatic etiology History of ankle surgery with hardware in place. X-ray on admission without complicated findings Orthopedic surgery consulted and per their assessment, unlikely infection, recommend IV Solu-Medrol , then prednisone  taper following Patient will need to follow-up with dermatology and rheumatology  Alcohol use disorder Continues to drink socially Patient counseled to quit alcohol  Splenomegaly Thrombocytopenia Related to cirrhosis  Psoriasis Patient with a skin flare. Patient is not on maintenance therapy. He was previously seen by rheumatology (office closed), has an appointment next month in October with a new rheumatologist Continue clobetasol  started for management of skin rashes Patient was previously on Skyrizi   DVT prophylaxis: Lovenox  Code Status:   Code  Status: Full Code Family Communication: None at bedside Disposition Plan: Discharge home likely in 1-2 days pending improvement of ankle swelling/pain and stable respiratory status   Consultants:  Orthopedic surgery IR  Procedures:  Thoracentesis  Antimicrobials: None    Subjective: Patient denies any new complaints.  Still unable to ambulate, due to right ankle pain.   Objective: BP 131/66 (BP Location: Right Arm)   Pulse 74   Temp 98.2 F (36.8 C) (Oral)   Resp 18   Ht 5' 5 (1.651 m)   Wt 114.7 kg   SpO2 96%   BMI 42.08 kg/m   Examination: General: NAD  Cardiovascular: S1, S2 present Respiratory: CTAB Abdomen: Soft, nontender, nondistended, bowel sounds present Musculoskeletal: Right ankle edema with tenderness Skin: Noted dry plaques on right lower extremity (known psoriasis Psychiatry: Normal mood     Data Reviewed: I have personally reviewed following labs and imaging studies  CBC Lab Results  Component Value Date   WBC 4.2 02/21/2024   RBC 3.75 (L) 02/21/2024   HGB 12.0 (L) 02/21/2024   HCT 35.9 (L) 02/21/2024   MCV 95.7 02/21/2024   MCH 32.0 02/21/2024   PLT 123 (L) 02/21/2024   MCHC 33.4 02/21/2024   RDW 16.1 (H) 02/21/2024   LYMPHSABS 0.6 (L) 02/05/2024   MONOABS 0.4 02/03/2023   EOSABS 0.4 02/05/2024   BASOSABS 0.0 02/05/2024     Last metabolic panel Lab Results  Component Value Date   NA 138 02/21/2024   K 3.7 02/21/2024   CL 103 02/21/2024   CO2 28 02/21/2024   BUN 7 02/21/2024   CREATININE 0.65 02/21/2024   GLUCOSE 97 02/21/2024   GFRNONAA >60 02/21/2024   GFRAA >89 06/22/2013   CALCIUM 8.4 (L) 02/21/2024   PHOS 4.0 02/21/2024   PROT 7.0 02/22/2024   ALBUMIN  2.2 (L) 02/22/2024   BILITOT 4.1 (H) 02/22/2024   ALKPHOS 199 (H)  02/22/2024   AST 55 (H) 02/22/2024   ALT 36 02/22/2024   ANIONGAP 7 02/21/2024    GFR: Estimated Creatinine Clearance: 135.1 mL/min (by C-G formula based on SCr of 0.65 mg/dL).  Recent Results  (from the past 240 hours)  Body fluid culture w Gram Stain     Status: None (Preliminary result)   Collection Time: 02/21/24 10:44 AM   Specimen: Lung, Right; Pleural Fluid  Result Value Ref Range Status   Specimen Description PLEURAL  Final   Special Requests LUNG,RIGHT  Final   Gram Stain   Final    RARE WBC PRESENT, PREDOMINANTLY MONONUCLEAR NO ORGANISMS SEEN    Culture   Final    NO GROWTH 1 DAY Performed at [facility], [address], [place], [place] [address]    Report Status PENDING  Incomplete      Radiology Studies: DG Chest 2 View Result Date: 02/22/2024 EXAM: 2 VIEW(S) XRAY OF THE CHEST 02/22/2024 05:18:00 AM COMPARISON: 02/21/2024 CLINICAL HISTORY: Pleural effusion associated with hepatic disorder FINDINGS: LUNGS AND PLEURA: Hazy right lung base opacity compatible with pleural effusion and atelectasis. No pneumothorax. HEART AND MEDIASTINUM: No acute abnormality of the cardiac and mediastinal silhouettes. BONES AND SOFT TISSUES: No acute osseous abnormality. IMPRESSION: 1. Hazy right lung base opacity compatible with pleural effusion and atelectasis, associated with hepatic disorder. Electronically signed by: Evalene Coho MD 02/22/2024 05:32 AM EDT RP Workstation: [id number]   US  THORACENTESIS ASP PLEURAL SPACE W/IMG GUIDE Result Date: 02/21/2024 INDICATION: 48 year old male with a history of decompensated cirrhosis and shortness of breath. Found to have large right pleural effusion. Request for diagnostic and therapeutic thoracentesis. EXAM: ULTRASOUND GUIDED DIAGNOSTIC AND THERAPEUTIC, RIGHT-SIDED THORACENTESIS MEDICATIONS: 1% lidocaine , 10 mL. COMPLICATIONS: None immediate. PROCEDURE: An ultrasound guided thoracentesis was thoroughly discussed with the patient and questions answered. The benefits, risks, alternatives and complications were also discussed. The patient understands and wishes to proceed with the procedure. Written consent was obtained. Ultrasound was  performed to localize and mark an adequate pocket of fluid in the right chest. The area was then prepped and draped in the normal sterile fashion. 1% Lidocaine  was used for local anesthesia. Under ultrasound guidance a 6 Fr Safe-T-Centesis catheter was introduced. Thoracentesis was performed. The catheter was removed and a dressing applied. FINDINGS: A total of approximately 650 mL of amber fluid was removed. Patient blood pressure started to drop during procedure the procedure was terminated early. Samples were sent to the laboratory as requested by the clinical team. IMPRESSION: Successful ultrasound guided right-sided thoracentesis yielding 650 mL of pleural fluid. Procedure performed by: Sherrilee Bal, PA-C under the supervision of Dr. JONETTA Faes Electronically Signed   By: JONETTA Faes M.D.   On: 02/21/2024 11:51   DG Chest 1 View Result Date: 02/21/2024 CLINICAL DATA:  Pleural effusion, status post right thoracentesis EXAM: CHEST  1 VIEW COMPARISON:  02/20/2024 FINDINGS: Right basilar and right midlung hazy opacity with obscuration of the right hemidiaphragm compatible with atelectasis and/or pleural effusion. Minimally improved aeration in this vicinity compared to 02/20/2024. No visible pneumothorax. Left lung appears clear. Mild enlargement of the cardiopericardial silhouette. IMPRESSION: 1. No pneumothorax identified. 2. Right basilar and right midlung hazy opacity compatible with atelectasis and/or pleural effusion. Minimally improved aeration in this vicinity compared to 02/20/2024. 3. Mild enlargement of the cardiopericardial silhouette. Electronically Signed   By: Ryan Salvage M.D.   On: 02/21/2024 10:38   US  Abdomen Limited Result Date: 02/21/2024 CLINICAL DATA:  Abdominal  distension EXAM: LIMITED ABDOMEN ULTRASOUND FOR ASCITES TECHNIQUE: Limited ultrasound survey for ascites was performed in all four abdominal quadrants. COMPARISON:  None Available. FINDINGS: Scanning in all 4 quadrants  shows evidence mild ascites. Prominent right pleural effusion is again noted. IMPRESSION: Mild ascites. Large right-sided pleural effusion. Electronically Signed   By: Oneil Devonshire M.D.   On: 02/21/2024 01:17   CT Angio Chest PE W and/or Wo Contrast Result Date: 02/20/2024 CLINICAL DATA:  Concern for pulmonary embolism. EXAM: CT ANGIOGRAPHY CHEST WITH CONTRAST TECHNIQUE: Multidetector CT imaging of the chest was performed using the standard protocol during bolus administration of intravenous contrast. Multiplanar CT image reconstructions and MIPs were obtained to evaluate the vascular anatomy. RADIATION DOSE REDUCTION: This exam was performed according to the departmental dose-optimization program which includes automated exposure control, adjustment of the mA and/or kV according to patient size and/or use of iterative reconstruction technique. CONTRAST:  75mL OMNIPAQUE  IOHEXOL  350 MG/ML SOLN COMPARISON:  Chest radiograph dated 02/20/2024. FINDINGS: Evaluation of this exam is limited due to respiratory motion. Cardiovascular: Top-normal cardiac size. No pericardial effusion. The thoracic aorta is unremarkable. The origins of the great vessels of the aortic arch appear patent. Evaluation of the pulmonary arteries is limited due to respiratory motion. No central pulmonary artery embolus identified. Mediastinum/Nodes: No hilar adenopathy. The esophagus is grossly unremarkable no mediastinal fluid collection. Lungs/Pleura: Large right pleural effusion. There is complete consolidation of the right lower lobe and partial consolidation of the right middle lobe which may represent atelectasis. A pneumonia or underlying mass is not excluded. Clinical correlation and follow-up to resolution recommended. Tiny left upper lobe calcified granuloma. No pneumothorax. The central airways are patent. Upper Abdomen: Cirrhosis, ascites, and splenomegaly. Musculoskeletal: No acute osseous pathology. Review of the MIP images confirms  the above findings. IMPRESSION: 1. No CT evidence of central pulmonary artery embolus. 2. Large right pleural effusion with compressive atelectasis of the right lower and right middle lobes. Pneumonia or underlying mass are not excluded. Clinical correlation and follow-up to resolution recommended. 3. Cirrhosis, ascites, and splenomegaly. Electronically Signed   By: Vanetta Chou M.D.   On: 02/20/2024 20:35   DG Ankle Complete Right Result Date: 02/20/2024 CLINICAL DATA:  Right ankle pain for 3.5 weeks EXAM: RIGHT ANKLE - COMPLETE 3+ VIEW COMPARISON:  02/05/2024 FINDINGS: Frontal, oblique, and lateral views of the right ankle are obtained. Postsurgical changes are seen from prior bimalleolar fracture repair. No acute fracture, subluxation, or dislocation. Mild osteoarthritis of the ankle, hindfoot, and midfoot. Mild diffuse subcutaneous edema unchanged. IMPRESSION: 1. Stable soft tissue edema. 2. No acute bony abnormality. Electronically Signed   By: Ozell Daring M.D.   On: 02/20/2024 19:40   DG Chest 2 View Result Date: 02/20/2024 CLINICAL DATA:  Shortness of breath. EXAM: CHEST - 2 VIEW COMPARISON:  Chest radiograph dated 02/22/2024. FINDINGS: Small right pleural effusion and right lung base atelectasis or infiltrate similar to prior radiograph. No pneumothorax. Stable cardiac silhouette. No acute osseous pathology. IMPRESSION: Small right pleural effusion and right lung base atelectasis or infiltrate. Electronically Signed   By: Vanetta Chou M.D.   On: 02/20/2024 18:48   DG Chest 2 View Result Date: 02/20/2024 EXAM: 2 VIEW(S) XRAY OF THE CHEST 02/20/2024 04:00:00 PM COMPARISON: None available. CLINICAL HISTORY: Shortness of breath with exertion for 5 days, non-alcoholic fatty liver, jaundice. FINDINGS: LUNGS AND PLEURA: Right lower lung opacity. Moderate right pleural effusion. No pneumothorax. HEART AND MEDIASTINUM: No acute abnormality of the cardiac and mediastinal silhouettes.  BONES AND SOFT  TISSUES: No acute osseous abnormality. IMPRESSION: 1. Moderate right pleural effusion and lower lung opacity, new since previous . Electronically signed by: Katheleen Faes MD 02/20/2024 04:20 PM EDT RP Workstation: [id number]      LOS: 0 days    Lebron JINNY Cage, MD [facility] 02/22/2024, 3:19 PM   If 7PM-7AM, please contact night-coverage [email or web address]

## 2024-02-22 NOTE — TOC Initial Note (Signed)
 Transition of Care (TOC) - Initial/Assessment Note   Spoke to patient at bedside. From home with father.  PCP Elsie Lent   Discussed PT recommendations for walker , patient in agreement .   NCM entered order and note. Secure chatted MD to sign.   Messaged [name] with [employer]  Patient Details  Name: Thomas [name] MRN: [id number] Date of Birth: Dec 30, 1975  Transition of Care [facility]) CM/SW Contact:    Stephane Powell Jansky, RN Phone Number: 02/22/2024, 9:09 AM  Clinical Narrative:                   Expected Discharge Plan: Home/Self Care Barriers to Discharge: Continued Medical Work up   Patient Goals and CMS Choice Patient states their goals for this hospitalization and ongoing recovery are:: to return to home CMS Medicare.gov Compare Post Acute Care list provided to:: Patient Choice offered to / list presented to : Patient      Expected Discharge Plan and Services   Discharge Planning Services: CM Consult Post Acute Care Choice: Durable Medical Equipment Living arrangements for the past 2 months: Single Family Home                 DME Arranged: Walker rolling DME Agency: [employer] Date DME Agency Contacted: 02/22/24 Time DME Agency Contacted: [phone] Representative spoke with at DME Agency: [place] HH Arranged: NA HH Agency: NA        Prior Living Arrangements/Services Living arrangements for the past 2 months: Single Family Home Lives with:: Parents Patient language and need for interpreter reviewed:: Yes Do you feel safe going back to the place where you live?: Yes      Need for Family Participation in Patient Care: Yes (Comment) Care giver support system in place?: Yes (comment)   Criminal Activity/Legal Involvement Pertinent to Current Situation/Hospitalization: No - Comment as needed  Activities of Daily Living   ADL Screening (condition at time of admission) Independently performs ADLs?: Yes (appropriate for developmental age) Is the  patient deaf or have difficulty hearing?: No Does the patient have difficulty seeing, even when wearing glasses/contacts?: No Does the patient have difficulty concentrating, remembering, or making decisions?: No  Permission Sought/Granted   Permission granted to share information with : Yes, Verbal Permission Granted     Permission granted to share info w AGENCY: [employer]        Emotional Assessment Appearance:: Appears stated age Attitude/Demeanor/Rapport: Engaged Affect (typically observed): Appropriate Orientation: : Oriented to Self, Oriented to Place, Oriented to  Time, Oriented to Situation Alcohol / Substance Use: Not Applicable Psych Involvement: No (comment)  Admission diagnosis:  Pleural effusion associated with hepatic disorder [K76.9, J91.8] Psoriasis [L40.9] ETOH abuse [F10.10] Pleural effusion [J90] Right leg pain [M79.604] Elevated LFTs [R79.89] Cirrhosis of liver with ascites, unspecified hepatic cirrhosis type (HCC) [K74.60, R18.8] Ascites due to alcoholic cirrhosis (HCC) [K70.31] Patient Active Problem List   Diagnosis Date Noted   Pleural effusion associated with hepatic disorder 02/20/2024   Cellulitis of abdominal wall 03/10/2023   Esophageal varices without bleeding (HCC) 02/13/2023   History of ETOH abuse 01/09/2023   Ankle fracture 01/07/2023   Localized edema 01/07/2023   Closed right ankle fracture 01/06/2023   Thrombocytopenia (HCC) 01/06/2023   Cirrhosis of liver (HCC) 12/25/2022   Secondary esophageal varices with bleeding (HCC) 12/25/2022   Reactive airway disease 07/28/2022   Acute upper GI bleed 07/05/2022   Hematemesis with nausea 07/05/2022   Melena 07/05/2022   Pharyngitis 10/22/2021  Psoriatic arthritis (HCC) 10/03/2021   Viral upper respiratory tract infection 10/03/2021   Acute blood loss anemia 08/26/2021   Screening for tuberculosis 08/15/2021   Elevated BP without diagnosis of hypertension 06/14/2021   Need for immunization  against influenza 06/14/2021   Esophageal varices in alcoholic cirrhosis (HCC) 09/26/2019   Class 3 severe obesity due to excess calories with body mass index (BMI) of 50.0 to 59.9 in adult 09/26/2019   Healthcare maintenance 02/07/2019   Psoriasis 02/07/2019   Vitamin D  deficiency 05/07/2016   OSA (obstructive sleep apnea) 06/23/2013   Insomnia 06/16/2013   Alcohol use disorder 12/25/2011   Scrotal varices, 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   Alcoholic fatty liver 01/16/2010   Allergic rhinitis 10/12/2007   PCP:  Berneta Elsie Sayre, MD Pharmacy:   [facility] [phone] - [place], [place] - [address] [place] [address] Phone: [phone] Fax: [phone]     Social Drivers of Health (SDOH) Social History: SDOH Screenings   Food Insecurity: No Food Insecurity (02/21/2024)  Housing: Low Risk  (02/21/2024)  Transportation Needs: No Transportation Needs (02/21/2024)  Utilities: Not At Risk (02/21/2024)  Depression (PHQ2-9): Low Risk  (07/28/2022)  Financial Resource Strain: Low Risk  (11/26/2023)  Physical Activity: Insufficiently Active (11/26/2023)  Social Connections: Socially Isolated (11/26/2023)  Stress: No Stress Concern Present (11/26/2023)  Tobacco Use: High Risk (02/20/2024)   SDOH Interventions:     Readmission Risk Interventions     No data to display

## 2024-02-22 NOTE — TOC Progression Note (Signed)
 Transition of Care [facility]) - Progression Note    Patient Details  Name: Thomas [name] MRN: [id number] Date of Birth: Jan 16, 1976  Transition of Care [facility]) CM/SW Contact  Carlos Heber LITTIE Moose, [place] Phone Number: 02/22/2024, 12:49 PM  Clinical Narrative:    CSW completed SDOH consult with pt. Pt stated he lives with his dad, has transportation and a PCP. Pt denied use of alcohol or drugs.    Expected Discharge Plan: Home/Self Care Barriers to Discharge: Continued Medical Work up               Expected Discharge Plan and Services   Discharge Planning Services: CM Consult Post Acute Care Choice: Durable Medical Equipment Living arrangements for the past 2 months: Single Family Home                 DME Arranged: Walker rolling DME Agency: [employer] Date DME Agency Contacted: 02/22/24 Time DME Agency Contacted: [phone] Representative spoke with at DME Agency: [place] HH Arranged: NA HH Agency: NA         Social Drivers of Health (SDOH) Interventions SDOH Screenings   Food Insecurity: No Food Insecurity (02/21/2024)  Housing: Low Risk  (02/21/2024)  Transportation Needs: No Transportation Needs (02/21/2024)  Utilities: Not At Risk (02/21/2024)  Depression (PHQ2-9): Low Risk  (07/28/2022)  Financial Resource Strain: Low Risk  (11/26/2023)  Physical Activity: Insufficiently Active (11/26/2023)  Social Connections: Socially Isolated (11/26/2023)  Stress: No Stress Concern Present (11/26/2023)  Tobacco Use: High Risk (02/20/2024)    Readmission Risk Interventions     No data to display

## 2024-02-22 NOTE — Progress Notes (Signed)
 Subjective:    Patient is alert, oriented. Feels like pain and swelling are a little improved but also receiving oxycodone  as well. No calf pain. No nausea/vomiting. Patient worried about discharging from hospital with no long term psoriasis medication option since he is not seeing his new dermatologist until early October.   Objective:  PE: VITALS:   Vitals:   02/21/24 1603 02/21/24 2010 02/22/24 0404 02/22/24 0600  BP: 119/72 131/62 (!) 151/76   Pulse: 67 68 74   Resp: 18 18    Temp: 98.2 F (36.8 C) 98.4 F (36.9 C) 98.3 F (36.8 C)   TempSrc: Oral Oral Oral   SpO2: 96% 96% 96%   Weight:    114.7 kg  Height:        Sensation diffusely intact at the right ankle foot and toes.  Able to dorsiflex and plantarflex approximately 10 degrees however this does cause pain.  Tender to palpation over medial and lateral malleoli and anterior joint line.  Mild edema.  Edema and tenderness improving. Psoriatic plaques over anterior lower leg.  Well-healed surgical wounds, no signs of infection.  2+ DP pulse.   LABS  Results for orders placed or performed during the hospital encounter of 02/20/24 (from the past 24 hours)  Lactate dehydrogenase (pleural or peritoneal fluid)     Status: Abnormal   Collection Time: 02/21/24 10:44 AM  Result Value Ref Range   LD, Fluid 50 (H) 3 - 23 U/L   Fluid Type-FLDH PLEURAL FLUID RT LUNG NO CYTO   Body fluid cell count with differential     Status: Abnormal   Collection Time: 02/21/24 10:44 AM  Result Value Ref Range   Fluid Type-FCT PLEURAL FLUID RT LUNG NO CYTO    Color, Fluid YELLOW YELLOW   Appearance, Fluid HAZY (A) CLEAR   Total Nucleated Cell Count, Fluid 674 0 - 1,000 cu mm   Neutrophil Count, Fluid 18 0 - 25 %   Lymphs, Fluid 73 %   Monocyte-Macrophage-Serous Fluid 8 (L) 50 - 90 %   Eos, Fluid 1 %   Other Cells, Fluid OTHER CELLS IDENTIFIED AS MESOTHELIAL CELLS %  Albumin , pleural or peritoneal fluid      Status: None   Collection  Time: 02/21/24 10:44 AM  Result Value Ref Range   Albumin , Fluid <1.5 g/dL   Fluid Type-FALB PLEURAL FLUID RT LUNG NO CYTO   Protein, pleural or peritoneal fluid     Status: None   Collection Time: 02/21/24 10:44 AM  Result Value Ref Range   Total protein, fluid <3.0 g/dL   Fluid Type-FTP PLEURAL FLUID RT LUNG NO CYTO   Body fluid culture w Gram Stain     Status: None (Preliminary result)   Collection Time: 02/21/24 10:44 AM   Specimen: Lung, Right; Pleural Fluid  Result Value Ref Range   Specimen Description PLEURAL    Special Requests LUNG,RIGHT    Gram Stain      RARE WBC PRESENT, PREDOMINANTLY MONONUCLEAR NO ORGANISMS SEEN Performed at [facility], [address] [place], [place] [address]    Culture PENDING    Report Status PENDING   Hepatic function panel     Status: Abnormal   Collection Time: 02/22/24  2:02 AM  Result Value Ref Range   Total Protein 7.0 6.5 - 8.1 g/dL   Albumin  2.2 (L) 3.5 - 5.0 g/dL   AST 55 (H) 15 - 41 U/L   ALT 36 0 -  44 U/L   Alkaline Phosphatase 199 (H) 38 - 126 U/L   Total Bilirubin 4.1 (H) 0.0 - 1.2 mg/dL   Bilirubin, Direct 1.2 (H) 0.0 - 0.2 mg/dL   Indirect Bilirubin 2.9 (H) 0.3 - 0.9 mg/dL    DG Chest 2 View Result Date: 02/22/2024 EXAM: 2 VIEW(S) XRAY OF THE CHEST 02/22/2024 05:18:00 AM COMPARISON: 02/21/2024 CLINICAL HISTORY: Pleural effusion associated with hepatic disorder FINDINGS: LUNGS AND PLEURA: Hazy right lung base opacity compatible with pleural effusion and atelectasis. No pneumothorax. HEART AND MEDIASTINUM: No acute abnormality of the cardiac and mediastinal silhouettes. BONES AND SOFT TISSUES: No acute osseous abnormality. IMPRESSION: 1. Hazy right lung base opacity compatible with pleural effusion and atelectasis, associated with hepatic disorder. Electronically signed by: Evalene Coho MD 02/22/2024 05:32 AM EDT RP Workstation: [id number]   US  THORACENTESIS ASP PLEURAL SPACE W/IMG GUIDE Result Date:  02/21/2024 INDICATION: 48 year old male with a history of decompensated cirrhosis and shortness of breath. Found to have large right pleural effusion. Request for diagnostic and therapeutic thoracentesis. EXAM: ULTRASOUND GUIDED DIAGNOSTIC AND THERAPEUTIC, RIGHT-SIDED THORACENTESIS MEDICATIONS: 1% lidocaine , 10 mL. COMPLICATIONS: None immediate. PROCEDURE: An ultrasound guided thoracentesis was thoroughly discussed with the patient and questions answered. The benefits, risks, alternatives and complications were also discussed. The patient understands and wishes to proceed with the procedure. Written consent was obtained. Ultrasound was performed to localize and mark an adequate pocket of fluid in the right chest. The area was then prepped and draped in the normal sterile fashion. 1% Lidocaine  was used for local anesthesia. Under ultrasound guidance a 6 Fr Safe-T-Centesis catheter was introduced. Thoracentesis was performed. The catheter was removed and a dressing applied. FINDINGS: A total of approximately 650 mL of amber fluid was removed. Patient blood pressure started to drop during procedure the procedure was terminated early. Samples were sent to the laboratory as requested by the clinical team. IMPRESSION: Successful ultrasound guided right-sided thoracentesis yielding 650 mL of pleural fluid. Procedure performed by: Sherrilee Bal, PA-C under the supervision of Dr. JONETTA Faes Electronically Signed   By: JONETTA Faes M.D.   On: 02/21/2024 11:51   DG Chest 1 View Result Date: 02/21/2024 CLINICAL DATA:  Pleural effusion, status post right thoracentesis EXAM: CHEST  1 VIEW COMPARISON:  02/20/2024 FINDINGS: Right basilar and right midlung hazy opacity with obscuration of the right hemidiaphragm compatible with atelectasis and/or pleural effusion. Minimally improved aeration in this vicinity compared to 02/20/2024. No visible pneumothorax. Left lung appears clear. Mild enlargement of the cardiopericardial  silhouette. IMPRESSION: 1. No pneumothorax identified. 2. Right basilar and right midlung hazy opacity compatible with atelectasis and/or pleural effusion. Minimally improved aeration in this vicinity compared to 02/20/2024. 3. Mild enlargement of the cardiopericardial silhouette. Electronically Signed   By: Ryan Salvage M.D.   On: 02/21/2024 10:38   US  Abdomen Limited Result Date: 02/21/2024 CLINICAL DATA:  Abdominal distension EXAM: LIMITED ABDOMEN ULTRASOUND FOR ASCITES TECHNIQUE: Limited ultrasound survey for ascites was performed in all four abdominal quadrants. COMPARISON:  None Available. FINDINGS: Scanning in all 4 quadrants shows evidence mild ascites. Prominent right pleural effusion is again noted. IMPRESSION: Mild ascites. Large right-sided pleural effusion. Electronically Signed   By: Oneil Devonshire M.D.   On: 02/21/2024 01:17   CT Angio Chest PE W and/or Wo Contrast Result Date: 02/20/2024 CLINICAL DATA:  Concern for pulmonary embolism. EXAM: CT ANGIOGRAPHY CHEST WITH CONTRAST TECHNIQUE: Multidetector CT imaging of the chest was performed using the standard protocol during bolus administration of intravenous contrast.  Multiplanar CT image reconstructions and MIPs were obtained to evaluate the vascular anatomy. RADIATION DOSE REDUCTION: This exam was performed according to the departmental dose-optimization program which includes automated exposure control, adjustment of the mA and/or kV according to patient size and/or use of iterative reconstruction technique. CONTRAST:  75mL OMNIPAQUE  IOHEXOL  350 MG/ML SOLN COMPARISON:  Chest radiograph dated 02/20/2024. FINDINGS: Evaluation of this exam is limited due to respiratory motion. Cardiovascular: Top-normal cardiac size. No pericardial effusion. The thoracic aorta is unremarkable. The origins of the great vessels of the aortic arch appear patent. Evaluation of the pulmonary arteries is limited due to respiratory motion. No central pulmonary artery  embolus identified. Mediastinum/Nodes: No hilar adenopathy. The esophagus is grossly unremarkable no mediastinal fluid collection. Lungs/Pleura: Large right pleural effusion. There is complete consolidation of the right lower lobe and partial consolidation of the right middle lobe which may represent atelectasis. A pneumonia or underlying mass is not excluded. Clinical correlation and follow-up to resolution recommended. Tiny left upper lobe calcified granuloma. No pneumothorax. The central airways are patent. Upper Abdomen: Cirrhosis, ascites, and splenomegaly. Musculoskeletal: No acute osseous pathology. Review of the MIP images confirms the above findings. IMPRESSION: 1. No CT evidence of central pulmonary artery embolus. 2. Large right pleural effusion with compressive atelectasis of the right lower and right middle lobes. Pneumonia or underlying mass are not excluded. Clinical correlation and follow-up to resolution recommended. 3. Cirrhosis, ascites, and splenomegaly. Electronically Signed   By: Vanetta Chou M.D.   On: 02/20/2024 20:35   DG Ankle Complete Right Result Date: 02/20/2024 CLINICAL DATA:  Right ankle pain for 3.5 weeks EXAM: RIGHT ANKLE - COMPLETE 3+ VIEW COMPARISON:  02/05/2024 FINDINGS: Frontal, oblique, and lateral views of the right ankle are obtained. Postsurgical changes are seen from prior bimalleolar fracture repair. No acute fracture, subluxation, or dislocation. Mild osteoarthritis of the ankle, hindfoot, and midfoot. Mild diffuse subcutaneous edema unchanged. IMPRESSION: 1. Stable soft tissue edema. 2. No acute bony abnormality. Electronically Signed   By: Ozell Daring M.D.   On: 02/20/2024 19:40   DG Chest 2 View Result Date: 02/20/2024 CLINICAL DATA:  Shortness of breath. EXAM: CHEST - 2 VIEW COMPARISON:  Chest radiograph dated 02/22/2024. FINDINGS: Small right pleural effusion and right lung base atelectasis or infiltrate similar to prior radiograph. No pneumothorax.  Stable cardiac silhouette. No acute osseous pathology. IMPRESSION: Small right pleural effusion and right lung base atelectasis or infiltrate. Electronically Signed   By: Vanetta Chou M.D.   On: 02/20/2024 18:48   DG Chest 2 View Result Date: 02/20/2024 EXAM: 2 VIEW(S) XRAY OF THE CHEST 02/20/2024 04:00:00 PM COMPARISON: None available. CLINICAL HISTORY: Shortness of breath with exertion for 5 days, non-alcoholic fatty liver, jaundice. FINDINGS: LUNGS AND PLEURA: Right lower lung opacity. Moderate right pleural effusion. No pneumothorax. HEART AND MEDIASTINUM: No acute abnormality of the cardiac and mediastinal silhouettes. BONES AND SOFT TISSUES: No acute osseous abnormality. IMPRESSION: 1. Moderate right pleural effusion and lower lung opacity, new since previous . Electronically signed by: Dayne Hassell MD 02/20/2024 04:20 PM EDT RP Workstation: [id number]    Today's  total administered Morphine  Milligram Equivalents: 7.5 Yesterday's total administered Morphine  Milligram Equivalents: 22.5  Assessment/Plan: Right ankle pain in patient with previous right ankle ORIF with history of alcoholic cirrhosis and currently untreated psoriatic arthritis  Weightbearing: WBAT RLE - will continue IV solumedrol today and begin to start taper tomorrow  - continue adjunctive pain control with oxycodone  as needed - patient will need outpatient rheumatology/dermatology  follow up on discharge, defer DMARD or biologic while inpatient to medicine   Contact information:   Army Daring, PA-C Weekdays 8-5  After hours and holidays please check [email or web address] for group call information for [facility]  Army MARLA Daring 02/22/2024, 9:11 AM

## 2024-02-22 NOTE — Plan of Care (Signed)
 Pt given 2 PRNs of oxycodone  during night shift.  Pain in right lower leg with activity. Problem: Education: Goal: Knowledge of General Education information will improve Description: Including pain rating scale, medication(s)/side effects and non-pharmacologic comfort measures Outcome: Progressing   Problem: Health Behavior/Discharge Planning: Goal: Ability to manage health-related needs will improve Outcome: Progressing   Problem: Clinical Measurements: Goal: Ability to maintain clinical measurements within normal limits will improve Outcome: Progressing Goal: Will remain free from infection Outcome: Progressing Goal: Diagnostic test results will improve Outcome: Progressing Goal: Respiratory complications will improve Outcome: Progressing Goal: Cardiovascular complication will be avoided Outcome: Progressing   Problem: Activity: Goal: Risk for activity intolerance will decrease Outcome: Progressing   Problem: Nutrition: Goal: Adequate nutrition will be maintained Outcome: Progressing   Problem: Coping: Goal: Level of anxiety will decrease Outcome: Progressing   Problem: Elimination: Goal: Will not experience complications related to bowel motility Outcome: Progressing Goal: Will not experience complications related to urinary retention Outcome: Progressing   Problem: Pain Managment: Goal: General experience of comfort will improve and/or be controlled Outcome: Progressing   Problem: Safety: Goal: Ability to remain free from injury will improve Outcome: Progressing   Problem: Skin Integrity: Goal: Risk for impaired skin integrity will decrease Outcome: Progressing

## 2024-02-23 DIAGNOSIS — J948 Other specified pleural conditions: Secondary | ICD-10-CM | POA: Diagnosis not present

## 2024-02-23 DIAGNOSIS — K769 Liver disease, unspecified: Secondary | ICD-10-CM | POA: Diagnosis not present

## 2024-02-23 DIAGNOSIS — L4050 Arthropathic psoriasis, unspecified: Secondary | ICD-10-CM | POA: Diagnosis not present

## 2024-02-23 DIAGNOSIS — M25571 Pain in right ankle and joints of right foot: Secondary | ICD-10-CM | POA: Diagnosis not present

## 2024-02-23 DIAGNOSIS — J918 Pleural effusion in other conditions classified elsewhere: Secondary | ICD-10-CM | POA: Diagnosis not present

## 2024-02-23 LAB — CBC WITH DIFFERENTIAL/PLATELET
Abs Immature Granulocytes: 0.1 K/uL — ABNORMAL HIGH (ref 0.00–0.07)
Basophils Absolute: 0 K/uL (ref 0.0–0.1)
Basophils Relative: 0 %
Eosinophils Absolute: 0 K/uL (ref 0.0–0.5)
Eosinophils Relative: 0 %
HCT: 37.7 % — ABNORMAL LOW (ref 39.0–52.0)
Hemoglobin: 12.7 g/dL — ABNORMAL LOW (ref 13.0–17.0)
Immature Granulocytes: 1 %
Lymphocytes Relative: 5 %
Lymphs Abs: 0.8 K/uL (ref 0.7–4.0)
MCH: 31.8 pg (ref 26.0–34.0)
MCHC: 33.7 g/dL (ref 30.0–36.0)
MCV: 94.3 fL (ref 80.0–100.0)
Monocytes Absolute: 0.7 K/uL (ref 0.1–1.0)
Monocytes Relative: 4 %
Neutro Abs: 14.5 K/uL — ABNORMAL HIGH (ref 1.7–7.7)
Neutrophils Relative %: 90 %
Platelets: 122 K/uL — ABNORMAL LOW (ref 150–400)
RBC: 4 MIL/uL — ABNORMAL LOW (ref 4.22–5.81)
RDW: 15.8 % — ABNORMAL HIGH (ref 11.5–15.5)
WBC: 16 K/uL — ABNORMAL HIGH (ref 4.0–10.5)
nRBC: 0 % (ref 0.0–0.2)

## 2024-02-23 LAB — COMPREHENSIVE METABOLIC PANEL WITH GFR
ALT: 35 U/L (ref 0–44)
AST: 57 U/L — ABNORMAL HIGH (ref 15–41)
Albumin: 2.2 g/dL — ABNORMAL LOW (ref 3.5–5.0)
Alkaline Phosphatase: 180 U/L — ABNORMAL HIGH (ref 38–126)
Anion gap: 6 (ref 5–15)
BUN: 10 mg/dL (ref 6–20)
CO2: 27 mmol/L (ref 22–32)
Calcium: 8.6 mg/dL — ABNORMAL LOW (ref 8.9–10.3)
Chloride: 104 mmol/L (ref 98–111)
Creatinine, Ser: 0.74 mg/dL (ref 0.61–1.24)
GFR, Estimated: >60 mL/min (ref >60)
Glucose, Bld: 124 mg/dL — ABNORMAL HIGH (ref 70–99)
Potassium: 4.6 mmol/L (ref 3.5–5.1)
Sodium: 137 mmol/L (ref 135–145)
Total Bilirubin: 3.3 mg/dL — ABNORMAL HIGH (ref 0.0–1.2)
Total Protein: 6.5 g/dL (ref 6.5–8.1)

## 2024-02-23 LAB — PATHOLOGIST SMEAR REVIEW

## 2024-02-23 MED ORDER — PREDNISONE 10 MG (21) PO TBPK: 20.0000 mg | ORAL_TABLET | Freq: Every evening | ORAL | Status: DC

## 2024-02-23 MED ORDER — PREDNISONE 10 MG (21) PO TBPK
20.0000 mg | ORAL_TABLET | Freq: Every morning | ORAL | Status: AC
  Administered 2024-02-24: 20 mg via ORAL
  Filled 2024-02-23 (×2): qty 21

## 2024-02-23 MED ORDER — PREDNISONE 10 MG (21) PO TBPK
10.0000 mg | ORAL_TABLET | ORAL | Status: AC
  Administered 2024-02-24: 10 mg via ORAL

## 2024-02-23 MED ORDER — PREDNISONE 10 MG (21) PO TBPK
10.0000 mg | ORAL_TABLET | ORAL | Status: DC
Start: 2024-02-24 — End: 2024-02-25

## 2024-02-23 MED ORDER — METHYLPREDNISOLONE SODIUM SUCC 40 MG IJ SOLR
40.0000 mg | Freq: Two times a day (BID) | INTRAMUSCULAR | Status: AC
  Administered 2024-02-23: 40 mg via INTRAVENOUS
  Filled 2024-02-23: qty 1

## 2024-02-23 MED ORDER — PREDNISONE 10 MG (21) PO TBPK
10.0000 mg | ORAL_TABLET | Freq: Three times a day (TID) | ORAL | Status: DC
Start: 2024-02-25 — End: 2024-02-26

## 2024-02-23 MED ORDER — PREDNISONE 10 MG (21) PO TBPK
10.0000 mg | ORAL_TABLET | Freq: Four times a day (QID) | ORAL | Status: DC
Start: 2024-02-26 — End: 2024-03-01

## 2024-02-23 NOTE — Progress Notes (Addendum)
 PROGRESS NOTE    Thomas [name]  FMW:[id number] DOB: 20-Feb-1976 DOA: 02/20/2024 PCP: Berneta Elsie Sayre, MD   Brief Narrative: Thomas [name] is a 48 y.o. male with a history of decompensated alcoholic cirrhosis with ascites, esophageal varices, splenomegaly, psoriasis. Patient presented secondary to worsening right ankle pain with associated swelling in addition to shortness of breath secondary to right-sided pleural effusion. Thoracentesis ordered and orthopedic surgery consulted.    Assessment and Plan: Right-sided pleural effusion Likely hepatic hydrothorax IR consulted and performed a thoracentesis on 9/13, yielding 650 mL of amber fluid, transudative Repeat chest x-ray showed hazy right lung base opacity compatible with pleural effusion and atelectasis associated with hepatic disorder Follow-up pleural fluid cytology  Decompensated alcoholic cirrhosis Esophageal varices Portal hypertension Patient with persistently elevated bilirubin Ascites noted on imaging. MELD-Na of 17 Continue Lasix  and spironolactone  Continue nadolol  Continue Protonix   Right ankle pain/swelling Concern for psoriatic etiology Afebrile, now with leukocytosis (steroids) History of ankle surgery with hardware in place. X-ray on admission without complicated findings  Orthopedic surgery consulted, unlikely infection, recommend s/p IV Solu-Medrol , then prednisone  taper Discussed extensively with Dr. Lonni Ester with [facility] health rheumatology, recommended safest short term option for severe joint inflammation would probably be medrol  or doing local aspiration/injection.  Scheduled outpatient follow-up with him on 03/03/2024 to establish care  Alcohol use disorder Continues to drink socially Patient counseled to quit alcohol  Splenomegaly Thrombocytopenia Related to cirrhosis  Psoriasis Patient with a skin flare. Patient is not on maintenance therapy. He was previously seen by rheumatology  (office closed), has an appointment next month in October with a new dermatologist Continue clobetasol  started for management of skin rashes Patient was previously on Skyrizi   DVT prophylaxis: Lovenox  Code Status:   Code Status: Full Code Family Communication: Father at bedside Disposition Plan: home  Consultants:  Orthopedic surgery IR  Procedures:  Thoracentesis  Antimicrobials: None    Subjective: Still with R ankle pain, but improving with steroids   Objective: BP 132/64 (BP Location: Right Arm)   Pulse 70   Temp 97.8 F (36.6 C) (Axillary)   Resp 18   Ht 5' 5 (1.651 m)   Wt 114.7 kg   SpO2 97%   BMI 42.08 kg/m   Examination: General: NAD  Cardiovascular: S1, S2 present Respiratory: CTAB Abdomen: Soft, nontender, nondistended, bowel sounds present Musculoskeletal: Right ankle mild edema with tenderness Skin: Noted dry plaques on right lower extremity (known psoriasis) Psychiatry: Normal mood     Data Reviewed: I have personally reviewed following labs and imaging studies  CBC Lab Results  Component Value Date   WBC 16.0 (H) 02/23/2024   RBC 4.00 (L) 02/23/2024   HGB 12.7 (L) 02/23/2024   HCT 37.7 (L) 02/23/2024   MCV 94.3 02/23/2024   MCH 31.8 02/23/2024   PLT 122 (L) 02/23/2024   MCHC 33.7 02/23/2024   RDW 15.8 (H) 02/23/2024   LYMPHSABS 0.8 02/23/2024   MONOABS 0.7 02/23/2024   EOSABS 0.0 02/23/2024   BASOSABS 0.0 02/23/2024     Last metabolic panel Lab Results  Component Value Date   NA 137 02/23/2024   K 4.6 02/23/2024   CL 104 02/23/2024   CO2 27 02/23/2024   BUN 10 02/23/2024   CREATININE 0.74 02/23/2024   GLUCOSE 124 (H) 02/23/2024   GFRNONAA >60 02/23/2024   GFRAA >89 06/22/2013   CALCIUM 8.6 (L) 02/23/2024   PHOS 4.0 02/21/2024   PROT 6.5 02/23/2024   ALBUMIN  2.2 (L)  02/23/2024   BILITOT 3.3 (H) 02/23/2024   ALKPHOS 180 (H) 02/23/2024   AST 57 (H) 02/23/2024   ALT 35 02/23/2024   ANIONGAP 6 02/23/2024     GFR: Estimated Creatinine Clearance: 135.1 mL/min (by C-G formula based on SCr of 0.74 mg/dL).  Recent Results (from the past 240 hours)  Body fluid culture w Gram Stain     Status: None (Preliminary result)   Collection Time: 02/21/24 10:44 AM   Specimen: Lung, Right; Pleural Fluid  Result Value Ref Range Status   Specimen Description PLEURAL  Final   Special Requests LUNG,RIGHT  Final   Gram Stain   Final    RARE WBC PRESENT, PREDOMINANTLY MONONUCLEAR NO ORGANISMS SEEN    Culture   Final    NO GROWTH 2 DAYS Performed at [facility], [address] [place], [place] [address]    Report Status PENDING  Incomplete      Radiology Studies: DG Chest 2 View Result Date: 02/22/2024 EXAM: 2 VIEW(S) XRAY OF THE CHEST 02/22/2024 05:18:00 AM COMPARISON: 02/21/2024 CLINICAL HISTORY: Pleural effusion associated with hepatic disorder FINDINGS: LUNGS AND PLEURA: Hazy right lung base opacity compatible with pleural effusion and atelectasis. No pneumothorax. HEART AND MEDIASTINUM: No acute abnormality of the cardiac and mediastinal silhouettes. BONES AND SOFT TISSUES: No acute osseous abnormality. IMPRESSION: 1. Hazy right lung base opacity compatible with pleural effusion and atelectasis, associated with hepatic disorder. Electronically signed by: Evalene Coho MD 02/22/2024 05:32 AM EDT RP Workstation: [id number]      LOS: 0 days    Lebron JINNY Cage, MD [facility] 02/23/2024, 3:40 PM   If 7PM-7AM, please contact night-coverage [email or web address]

## 2024-02-23 NOTE — Progress Notes (Signed)
 Subjective:    Patient is alert, oriented. Feels like pain and swelling are a night and day improved from admission but still doesn't feel resolved.  Patient worried about discharging from hospital with no long term psoriasis medication option since he is not seeing his new dermatologist until early October.   Objective:  PE: VITALS:   Vitals:   02/22/24 0935 02/22/24 1618 02/22/24 2100 02/23/24 0530  BP: 131/66 126/67 (!) 116/57 132/64  Pulse: 74 66 67 70  Resp: 18 18 18 18   Temp: 98.2 F (36.8 C) 98 F (36.7 C) 97.6 F (36.4 C) 97.8 F (36.6 C)  TempSrc: Oral  Axillary Axillary  SpO2: 96% 95% 93% 97%  Weight:      Height:        Sensation diffusely intact at the right ankle foot and toes.  Able to dorsiflex and plantarflex approximately 10 degrees however this does cause pain.  Tender to palpation over medial and lateral malleoli and anterior joint line.  Mild edema.  Edema and tenderness improving. Psoriatic plaques over anterior lower leg.  Well-healed surgical wounds, no signs of infection.  2+ DP pulse.   LABS  Results for orders placed or performed during the hospital encounter of 02/20/24 (from the past 24 hours)  Comprehensive metabolic panel with GFR     Status: Abnormal   Collection Time: 02/23/24  2:12 AM  Result Value Ref Range   Sodium 137 135 - 145 mmol/L   Potassium 4.6 3.5 - 5.1 mmol/L   Chloride 104 98 - 111 mmol/L   CO2 27 22 - 32 mmol/L   Glucose, Bld 124 (H) 70 - 99 mg/dL   BUN 10 6 - 20 mg/dL   Creatinine, Ser 9.25 0.61 - 1.24 mg/dL   Calcium 8.6 (L) 8.9 - 10.3 mg/dL   Total Protein 6.5 6.5 - 8.1 g/dL   Albumin  2.2 (L) 3.5 - 5.0 g/dL   AST 57 (H) 15 - 41 U/L   ALT 35 0 - 44 U/L   Alkaline Phosphatase 180 (H) 38 - 126 U/L   Total Bilirubin 3.3 (H) 0.0 - 1.2 mg/dL   GFR, Estimated >39 >39 mL/min   Anion gap 6 5 - 15  CBC with Differential/Platelet     Status: Abnormal   Collection Time: 02/23/24  2:12 AM  Result Value Ref Range   WBC  16.0 (H) 4.0 - 10.5 K/uL   RBC 4.00 (L) 4.22 - 5.81 MIL/uL   Hemoglobin 12.7 (L) 13.0 - 17.0 g/dL   HCT 62.2 (L) 60.9 - 47.9 %   MCV 94.3 80.0 - 100.0 fL   MCH 31.8 26.0 - 34.0 pg   MCHC 33.7 30.0 - 36.0 g/dL   RDW 84.1 (H) 88.4 - 84.4 %   Platelets 122 (L) 150 - 400 K/uL   nRBC 0.0 0.0 - 0.2 %   Neutrophils Relative % 90 %   Neutro Abs 14.5 (H) 1.7 - 7.7 K/uL   Lymphocytes Relative 5 %   Lymphs Abs 0.8 0.7 - 4.0 K/uL   Monocytes Relative 4 %   Monocytes Absolute 0.7 0.1 - 1.0 K/uL   Eosinophils Relative 0 %   Eosinophils Absolute 0.0 0.0 - 0.5 K/uL   Basophils Relative 0 %   Basophils Absolute 0.0 0.0 - 0.1 K/uL   Immature Granulocytes 1 %   Abs Immature Granulocytes 0.10 (H) 0.00 - 0.07 K/uL    DG Chest 2 View Result Date: 02/22/2024 EXAM: 2  VIEW(S) XRAY OF THE CHEST 02/22/2024 05:18:00 AM COMPARISON: 02/21/2024 CLINICAL HISTORY: Pleural effusion associated with hepatic disorder FINDINGS: LUNGS AND PLEURA: Hazy right lung base opacity compatible with pleural effusion and atelectasis. No pneumothorax. HEART AND MEDIASTINUM: No acute abnormality of the cardiac and mediastinal silhouettes. BONES AND SOFT TISSUES: No acute osseous abnormality. IMPRESSION: 1. Hazy right lung base opacity compatible with pleural effusion and atelectasis, associated with hepatic disorder. Electronically signed by: Evalene Coho MD 02/22/2024 05:32 AM EDT RP Workstation: [id number]    Today's  total administered Morphine  Milligram Equivalents: 15 Yesterday's total administered Morphine  Milligram Equivalents: 22.5  Assessment/Plan: Right ankle pain in patient with previous right ankle ORIF with history of alcoholic cirrhosis and currently untreated psoriatic arthritis  Weightbearing: WBAT RLE - will continue IV solumedrol today and begin to start taper tomorrow  - continue adjunctive pain control with oxycodone  as needed - patient will need outpatient rheumatology/dermatology follow up on discharge,  defer DMARD or biologic while inpatient to medicine   Contact information:   Army Daring, PA-C Weekdays 8-5  After hours and holidays please check [email or web address] for group call information for [facility]  Army MARLA Daring 02/23/2024, 1:25 PM

## 2024-02-23 NOTE — Progress Notes (Signed)
 Physical Therapy Treatment Patient Details Name: Thomas [name] MRN: [id number] DOB: 1975-12-15 Today's Date: 02/23/2024   History of Present Illness Thomas [name] is a 48 y.o. male who presented to [facility] ED 02/20/24 from UC for right ankle pain, difficulty ambulating, and SOB with large R pleural effusion. PMHx: decompensated alcoholic cirrhosis with ascites, EV, splenomegaly, psoriasis, obesity, sleep apnea, psoriatic arthritis, portal hypertensive gastropathy, esophageal and gastric varices, Right ankle ORIF (12/28/2022).    PT Comments  Pt resting in bed on arrival, pleasant and agreeable to session with pt demonstrating good progress towards acute goals. Pt with decreased pain this session, with ability to increase weight bearing through RLE throughout all mobility. Pt progressing gait distance significantly with RW for support and grossly supervision for safety. Pt was educated on continued walker use to maximize functional independence, safety, and decrease risk for falls. Pt continues to benefit from skilled PT services to progress toward functional mobility goals.     If plan is discharge home, recommend the following: A little help with walking and/or transfers;Help with stairs or ramp for entrance;Assist for transportation   Can travel by private vehicle        Equipment Recommendations  Rolling walker (2 wheels)    Recommendations for Other Services       Precautions / Restrictions Precautions Precautions: Fall Recall of Precautions/Restrictions: Intact Restrictions Weight Bearing Restrictions Per Provider Order: No     Mobility  Bed Mobility Overal bed mobility: Modified Independent             General bed mobility comments: Pt performed supine<>sit with HOB elevated.    Transfers Overall transfer level: Modified independent Equipment used: Rolling walker (2 wheels)               General transfer comment: Pt demonstrated proper hand placement using RW.  Powered up without physical assist. Good eccentric control.    Ambulation/Gait Ambulation/Gait assistance: Supervision Gait Distance (Feet): 550 Feet Assistive device: Rolling walker (2 wheels) Gait Pattern/deviations: Trunk flexed, Step-through pattern, Antalgic Gait velocity: decreased     General Gait Details: pt with increased weight bearing throughout RLE this session, noted external rotation of RLE throughout as pt verbalizing increased pain with DF through stance phase on R, no LOB, light cues for closer RW proximity and upright posture   Stairs             Wheelchair Mobility     Tilt Bed    Modified Rankin (Stroke Patients Only)       Balance Overall balance assessment: Mild deficits observed, not formally tested                                          Communication Communication Communication: No apparent difficulties  Cognition Arousal: Alert Behavior During Therapy: WFL for tasks assessed/performed   PT - Cognitive impairments: No apparent impairments                         Following commands: Intact      Cueing Cueing Techniques: Verbal cues  Exercises General Exercises - Lower Extremity Ankle Circles/Pumps: AROM, Right, 20 reps    General Comments General comments (skin integrity, edema, etc.): VSS on RA. Right ankle with mild edema. Psoriatic plaques over anterior lower leg.      Pertinent Vitals/Pain Pain Assessment Pain Assessment: Faces Faces  Pain Scale: Hurts a little bit Pain Location: R ankle Pain Descriptors / Indicators: Grimacing, Aching Pain Intervention(s): Monitored during session, Premedicated before session, Limited activity within patient's tolerance    Home Living                          Prior Function            PT Goals (current goals can now be found in the care plan section) Acute Rehab PT Goals Patient Stated Goal: Return Home PT Goal Formulation: With  patient/family Time For Goal Achievement: 03/06/24 Progress towards PT goals: Progressing toward goals    Frequency    Min 2X/week      PT Plan      Co-evaluation              AM-PAC PT 6 Clicks Mobility   Outcome Measure  Help needed turning from your back to your side while in a flat bed without using bedrails?: None Help needed moving from lying on your back to sitting on the side of a flat bed without using bedrails?: None Help needed moving to and from a bed to a chair (including a wheelchair)?: A Little Help needed standing up from a chair using your arms (e.g., wheelchair or bedside chair)?: None Help needed to walk in hospital room?: A Little Help needed climbing 3-5 steps with a railing? : A Little 6 Click Score: 21    End of Session   Activity Tolerance: Patient tolerated treatment well;Patient limited by fatigue Patient left: in bed;with call bell/phone within reach;with family/visitor present Nurse Communication: Mobility status PT Visit Diagnosis: Difficulty in walking, not elsewhere classified (R26.2);Pain Pain - Right/Left: Right Pain - part of body: Ankle and joints of foot     Time: 8967-8953 PT Time Calculation (min) (ACUTE ONLY): 14 min  Charges:    $Gait Training: 8-22 mins PT General Charges $$ ACUTE PT VISIT: 1 Visit                     Carmellia Kreisler R. PTA Acute Rehabilitation Services Office: [phone]   Therisa CHRISTELLA Boor 02/23/2024, 10:53 AM

## 2024-02-23 NOTE — Plan of Care (Signed)
  Problem: Education: Goal: Knowledge of General Education information will improve Description: Including pain rating scale, medication(s)/side effects and non-pharmacologic comfort measures Outcome: Progressing   Problem: Activity: Goal: Risk for activity intolerance will decrease Outcome: Progressing   Problem: Nutrition: Goal: Adequate nutrition will be maintained Outcome: Progressing   Problem: Elimination: Goal: Will not experience complications related to bowel motility Outcome: Progressing   Problem: Pain Managment: Goal: General experience of comfort will improve and/or be controlled Outcome: Progressing   Problem: Safety: Goal: Ability to remain free from injury will improve Outcome: Progressing   Problem: Skin Integrity: Goal: Risk for impaired skin integrity will decrease Outcome: Progressing

## 2024-02-24 DIAGNOSIS — M25571 Pain in right ankle and joints of right foot: Secondary | ICD-10-CM | POA: Diagnosis not present

## 2024-02-24 DIAGNOSIS — J918 Pleural effusion in other conditions classified elsewhere: Secondary | ICD-10-CM | POA: Diagnosis not present

## 2024-02-24 DIAGNOSIS — K769 Liver disease, unspecified: Secondary | ICD-10-CM | POA: Diagnosis not present

## 2024-02-24 DIAGNOSIS — J948 Other specified pleural conditions: Secondary | ICD-10-CM | POA: Diagnosis not present

## 2024-02-24 DIAGNOSIS — L4050 Arthropathic psoriasis, unspecified: Secondary | ICD-10-CM | POA: Diagnosis not present

## 2024-02-24 LAB — CBC WITH DIFFERENTIAL/PLATELET
Abs Immature Granulocytes: 0.06 K/uL (ref 0.00–0.07)
Basophils Absolute: 0 K/uL (ref 0.0–0.1)
Basophils Relative: 0 %
Eosinophils Absolute: 0 K/uL (ref 0.0–0.5)
Eosinophils Relative: 0 %
HCT: 36.9 % — ABNORMAL LOW (ref 39.0–52.0)
Hemoglobin: 12.4 g/dL — ABNORMAL LOW (ref 13.0–17.0)
Immature Granulocytes: 1 %
Lymphocytes Relative: 6 %
Lymphs Abs: 0.7 K/uL (ref 0.7–4.0)
MCH: 32.1 pg (ref 26.0–34.0)
MCHC: 33.6 g/dL (ref 30.0–36.0)
MCV: 95.6 fL (ref 80.0–100.0)
Monocytes Absolute: 0.7 K/uL (ref 0.1–1.0)
Monocytes Relative: 6 %
Neutro Abs: 10.7 K/uL — ABNORMAL HIGH (ref 1.7–7.7)
Neutrophils Relative %: 87 %
Platelets: 103 K/uL — ABNORMAL LOW (ref 150–400)
RBC: 3.86 MIL/uL — ABNORMAL LOW (ref 4.22–5.81)
RDW: 16.1 % — ABNORMAL HIGH (ref 11.5–15.5)
WBC: 12.1 K/uL — ABNORMAL HIGH (ref 4.0–10.5)
nRBC: 0 % (ref 0.0–0.2)

## 2024-02-24 LAB — BODY FLUID CULTURE W GRAM STAIN: Culture: NO GROWTH

## 2024-02-24 LAB — COMPREHENSIVE METABOLIC PANEL WITH GFR
ALT: 47 U/L — ABNORMAL HIGH (ref 0–44)
AST: 81 U/L — ABNORMAL HIGH (ref 15–41)
Albumin: 2 g/dL — ABNORMAL LOW (ref 3.5–5.0)
Alkaline Phosphatase: 186 U/L — ABNORMAL HIGH (ref 38–126)
Anion gap: 4 — ABNORMAL LOW (ref 5–15)
BUN: 10 mg/dL (ref 6–20)
CO2: 28 mmol/L (ref 22–32)
Calcium: 8.4 mg/dL — ABNORMAL LOW (ref 8.9–10.3)
Chloride: 104 mmol/L (ref 98–111)
Creatinine, Ser: 0.82 mg/dL (ref 0.61–1.24)
GFR, Estimated: >60 mL/min (ref >60)
Glucose, Bld: 110 mg/dL — ABNORMAL HIGH (ref 70–99)
Potassium: 4.2 mmol/L (ref 3.5–5.1)
Sodium: 136 mmol/L (ref 135–145)
Total Bilirubin: 2.3 mg/dL — ABNORMAL HIGH (ref 0.0–1.2)
Total Protein: 6.1 g/dL — ABNORMAL LOW (ref 6.5–8.1)

## 2024-02-24 MED ORDER — PREDNISONE 20 MG PO TABS
20.0000 mg | ORAL_TABLET | Freq: Once | ORAL | Status: AC
  Administered 2024-02-24: 20 mg via ORAL
  Filled 2024-02-24: qty 1

## 2024-02-24 MED ORDER — FUROSEMIDE 40 MG PO TABS: 40.0000 mg | ORAL_TABLET | Freq: Every day | ORAL | 5 refills | Status: DC

## 2024-02-24 MED ORDER — SPIRONOLACTONE 25 MG PO TABS: 25.0000 mg | ORAL_TABLET | Freq: Every day | ORAL | 0 refills | Status: DC

## 2024-02-24 MED ORDER — PREDNISONE 10 MG PO TABS: ORAL_TABLET | ORAL | 0 refills | Status: DC

## 2024-02-24 MED ORDER — CLOBETASOL PROPIONATE 0.05 % EX CREA: TOPICAL_CREAM | Freq: Two times a day (BID) | CUTANEOUS | 0 refills | Status: DC

## 2024-02-24 MED ORDER — DICLOFENAC SODIUM 1 % EX GEL: 4.0000 g | Freq: Four times a day (QID) | CUTANEOUS | 0 refills | Status: DC | PRN

## 2024-02-24 MED ORDER — OXYCODONE HCL 5 MG PO TABS: 5.0000 mg | ORAL_TABLET | Freq: Three times a day (TID) | ORAL | 0 refills | Status: DC | PRN

## 2024-02-24 MED ORDER — POLYETHYLENE GLYCOL 3350 17 G PO PACK: 17.0000 g | PACK | Freq: Every day | ORAL | 0 refills | Status: DC | PRN

## 2024-02-24 NOTE — Progress Notes (Signed)
 Orthopedic Tech Progress Note Patient Details:  Thomas [name] 10/31/1975 [id number]  Ortho Devices Type of Ortho Device: ASO, CAM walker Ortho Device/Splint Location: RLE Ortho Device/Splint Interventions: Ordered, Application, Adjustment  applied ASO with dad at bedside, left CAM WALKER BOOT in room, patient has had cam walker boot before so he knows how to apply   Post Interventions Patient Tolerated: Well Instructions Provided: Care of device  Thomas [name] Pac 02/24/2024, 2:40 PM

## 2024-02-24 NOTE — Progress Notes (Signed)
   02/24/24 0912  Mobility  Activity Ambulated with assistance  Level of Assistance Standby assist, set-up cues, supervision of patient - no hands on  Assistive Device Front wheel walker  Distance Ambulated (ft) 350 ft  RLE Weight Bearing Per Provider Order  (WBAT per ortho note but no Order)  Activity Response Tolerated fair  Mobility Referral Yes  Mobility visit 1 Mobility  Mobility Specialist Start Time (ACUTE ONLY) 0912  Mobility Specialist Stop Time (ACUTE ONLY) 0925  Mobility Specialist Time Calculation (min) (ACUTE ONLY) 13 min   Mobility Specialist: Progress Note  Pt agreeable to mobility session - received in bed. C/o R ankle pain 9/10. Ambulated to BR ind. Returned to chair with all needs met - call bell within reach.   Virgle Boards, BS Mobility Specialist Please contact via SecureChat or  Rehab office at [phone].

## 2024-02-24 NOTE — Plan of Care (Signed)
   Problem: Education: Goal: Knowledge of General Education information will improve Description Including pain rating scale, medication(s)/side effects and non-pharmacologic comfort measures Outcome: Progressing   Problem: Health Behavior/Discharge Planning: Goal: Ability to manage health-related needs will improve Outcome: Progressing

## 2024-02-24 NOTE — Plan of Care (Signed)
  Problem: Education: Goal: Knowledge of General Education information will improve Description: Including pain rating scale, medication(s)/side effects and non-pharmacologic comfort measures 02/24/2024 1511 by Delores Kirsch, RN Outcome: Adequate for Discharge 02/24/2024 1511 by Delores Kirsch, RN Outcome: Progressing 02/24/2024 1510 by Delores Kirsch, RN Outcome: Progressing   Problem: Health Behavior/Discharge Planning: Goal: Ability to manage health-related needs will improve 02/24/2024 1511 by Delores Kirsch, RN Outcome: Adequate for Discharge 02/24/2024 1511 by Delores Kirsch, RN Outcome: Progressing 02/24/2024 1510 by Delores Kirsch, RN Outcome: Progressing   Problem: Clinical Measurements: Goal: Ability to maintain clinical measurements within normal limits will improve 02/24/2024 1511 by Delores Kirsch, RN Outcome: Adequate for Discharge 02/24/2024 1511 by Delores Kirsch, RN Outcome: Progressing Goal: Will remain free from infection 02/24/2024 1511 by Delores Kirsch, RN Outcome: Adequate for Discharge 02/24/2024 1511 by Delores Kirsch, RN Outcome: Progressing Goal: Diagnostic test results will improve 02/24/2024 1511 by Delores Kirsch, RN Outcome: Adequate for Discharge 02/24/2024 1511 by Delores Kirsch, RN Outcome: Progressing Goal: Respiratory complications will improve 02/24/2024 1511 by Delores Kirsch, RN Outcome: Adequate for Discharge 02/24/2024 1511 by Delores Kirsch, RN Outcome: Progressing Goal: Cardiovascular complication will be avoided 02/24/2024 1511 by Delores Kirsch, RN Outcome: Adequate for Discharge 02/24/2024 1511 by Delores Kirsch, RN Outcome: Progressing   Problem: Activity: Goal: Risk for activity intolerance will decrease 02/24/2024 1511 by Delores Kirsch, RN Outcome: Adequate for Discharge 02/24/2024 1511 by Delores Kirsch, RN Outcome: Progressing   Problem: Nutrition: Goal: Adequate nutrition will be maintained 02/24/2024 1511 by Delores Kirsch,  RN Outcome: Adequate for Discharge 02/24/2024 1511 by Delores Kirsch, RN Outcome: Progressing   Problem: Coping: Goal: Level of anxiety will decrease 02/24/2024 1511 by Delores Kirsch, RN Outcome: Adequate for Discharge 02/24/2024 1511 by Delores Kirsch, RN Outcome: Progressing   Problem: Elimination: Goal: Will not experience complications related to bowel motility 02/24/2024 1511 by Delores Kirsch, RN Outcome: Adequate for Discharge 02/24/2024 1511 by Delores Kirsch, RN Outcome: Progressing Goal: Will not experience complications related to urinary retention 02/24/2024 1511 by Delores Kirsch, RN Outcome: Adequate for Discharge 02/24/2024 1511 by Delores Kirsch, RN Outcome: Progressing   Problem: Pain Managment: Goal: General experience of comfort will improve and/or be controlled 02/24/2024 1511 by Delores Kirsch, RN Outcome: Adequate for Discharge 02/24/2024 1511 by Delores Kirsch, RN Outcome: Progressing   Problem: Safety: Goal: Ability to remain free from injury will improve 02/24/2024 1511 by Delores Kirsch, RN Outcome: Adequate for Discharge 02/24/2024 1511 by Delores Kirsch, RN Outcome: Progressing   Problem: Skin Integrity: Goal: Risk for impaired skin integrity will decrease 02/24/2024 1511 by Delores Kirsch, RN Outcome: Adequate for Discharge 02/24/2024 1511 by Delores Kirsch, RN Outcome: Progressing

## 2024-02-24 NOTE — Progress Notes (Signed)
 Subjective:    Patient is alert, oriented. Feels like pain and swelling are significantly improved from admission but still doesn't feel resolved.  Amendable to plan for closer follow up with rheumatology, wondering if he can have something for stability when working with PT.   Objective:  PE: VITALS:   Vitals:   02/23/24 1545 02/23/24 2133 02/24/24 0653 02/24/24 0717  BP: (!) 108/53 120/78 133/69 124/68  Pulse: (!) 58  (!) 57 (!) 57  Resp: 18 18 16 16   Temp: 98.2 F (36.8 C) 98.5 F (36.9 C) 97.9 F (36.6 C) 98.4 F (36.9 C)  TempSrc: Oral Oral Oral Oral  SpO2: 95%  96% 94%  Weight:      Height:        Sensation diffusely intact at the right ankle foot and toes.  Able to dorsiflex and plantarflex approximately 10 degrees however this does cause pain.  Nontender to touch today. Psoriatic plaques over anterior lower leg improving since admission. Well-healed surgical wounds, no signs of infection.  2+ DP pulse.   LABS  Results for orders placed or performed during the hospital encounter of 02/20/24 (from the past 24 hours)  CBC with Differential/Platelet     Status: Abnormal   Collection Time: 02/24/24  2:15 AM  Result Value Ref Range   WBC 12.1 (H) 4.0 - 10.5 K/uL   RBC 3.86 (L) 4.22 - 5.81 MIL/uL   Hemoglobin 12.4 (L) 13.0 - 17.0 g/dL   HCT 63.0 (L) 60.9 - 47.9 %   MCV 95.6 80.0 - 100.0 fL   MCH 32.1 26.0 - 34.0 pg   MCHC 33.6 30.0 - 36.0 g/dL   RDW 83.8 (H) 88.4 - 84.4 %   Platelets 103 (L) 150 - 400 K/uL   nRBC 0.0 0.0 - 0.2 %   Neutrophils Relative % 87 %   Neutro Abs 10.7 (H) 1.7 - 7.7 K/uL   Lymphocytes Relative 6 %   Lymphs Abs 0.7 0.7 - 4.0 K/uL   Monocytes Relative 6 %   Monocytes Absolute 0.7 0.1 - 1.0 K/uL   Eosinophils Relative 0 %   Eosinophils Absolute 0.0 0.0 - 0.5 K/uL   Basophils Relative 0 %   Basophils Absolute 0.0 0.0 - 0.1 K/uL   Immature Granulocytes 1 %   Abs Immature Granulocytes 0.06 0.00 - 0.07 K/uL  Comprehensive metabolic panel  with GFR     Status: Abnormal   Collection Time: 02/24/24  2:15 AM  Result Value Ref Range   Sodium 136 135 - 145 mmol/L   Potassium 4.2 3.5 - 5.1 mmol/L   Chloride 104 98 - 111 mmol/L   CO2 28 22 - 32 mmol/L   Glucose, Bld 110 (H) 70 - 99 mg/dL   BUN 10 6 - 20 mg/dL   Creatinine, Ser 9.17 0.61 - 1.24 mg/dL   Calcium 8.4 (L) 8.9 - 10.3 mg/dL   Total Protein 6.1 (L) 6.5 - 8.1 g/dL   Albumin  2.0 (L) 3.5 - 5.0 g/dL   AST 81 (H) 15 - 41 U/L   ALT 47 (H) 0 - 44 U/L   Alkaline Phosphatase 186 (H) 38 - 126 U/L   Total Bilirubin 2.3 (H) 0.0 - 1.2 mg/dL   GFR, Estimated >39 >39 mL/min   Anion gap 4 (L) 5 - 15    No results found.   Today's  total administered Morphine  Milligram Equivalents: 15 Yesterday's total administered Morphine  Milligram Equivalents: 22.5  Assessment/Plan: Right ankle  pain in patient with previous right ankle ORIF with history of alcoholic cirrhosis and currently untreated psoriatic arthritis  Weightbearing: WBAT RLE - starting prednisone  taper, ok to discharge home with rest of taper  - sounds like patient was able to be scheduled for earlier follow-up with dermatology. Not planning for injection at this time due to infection risk in patient with previous ORIF - will order boot and ASO ankle brace for him to use for comfort when working with PT  - please reach out for any further questions or concerns.   Contact information:   Army Daring, DEVONNA Tzzxijbd 8-5  After hours and holidays please check [email or web address] for group call information for [facility]  Army MARLA Daring 02/24/2024, 1:29 PM

## 2024-02-24 NOTE — Progress Notes (Signed)
 RN completed discharge by Clotilda (charge)   Patient currently getting dressing and will be transported to E.R for discharge.  He states, that he is driving home.  Charge RN Clotilda updated.

## 2024-02-24 NOTE — Plan of Care (Signed)
  Problem: Clinical Measurements: Goal: Ability to maintain clinical measurements within normal limits will improve Outcome: Progressing   Problem: Clinical Measurements: Goal: Will remain free from infection Outcome: Progressing   Problem: Clinical Measurements: Goal: Respiratory complications will improve Outcome: Progressing   Problem: Activity: Goal: Risk for activity intolerance will decrease Outcome: Progressing   

## 2024-02-25 ENCOUNTER — Telehealth: Payer: Self-pay

## 2024-02-25 NOTE — Transitions of Care (Post Inpatient/ED Visit) (Signed)
 02/25/2024  Name: Thomas [name] MRN: [id number] DOB: September 22, 1975  Today's TOC FU Call Status: Today's TOC FU Call Status:: Successful TOC FU Call Completed TOC FU Call Complete Date: 02/25/24 Patient's Name and Date of Birth confirmed.  Transition Care Management Follow-up Telephone Call Date of Discharge: 02/24/24 Discharge Facility: [name]) Type of Discharge: Inpatient Admission Primary Inpatient Discharge Diagnosis:: plural effusion How have you been since you were released from the hospital?: Better Any questions or concerns?: No  Items Reviewed: Did you receive and understand the discharge instructions provided?: Yes Medications obtained,verified, and reconciled?: Yes (Medications Reviewed) Any new allergies since your discharge?: No Dietary orders reviewed?: Yes Do you have support at home?: No  Medications Reviewed Today: Medications Reviewed Today     Reviewed by Emmitt Pan, LPN (Licensed Practical Nurse) on 02/25/24 at 1144  Med List Status: <None>   Medication Order Taking? Sig Documenting Provider Last Dose Status Informant  acetaminophen  (TYLENOL ) 325 MG tablet [id number] Yes Take 1-2 tablets (325-650 mg total) by mouth every 4 (four) hours as needed for mild pain. Maurice Sharlet GORMAN, PA-C  Active   clobetasol  cream (TEMOVATE ) 0.05 % [id number] Yes Apply topically 2 (two) times daily. Apply to Psoriatic plaques on the extremities, trunk or scalp. DO not apply to face, intertriginous areas (armpits, groin, under pannus, gluteal cleft), or genitals. Tobie Yetta HERO, MD  Active   diclofenac  Sodium (VOLTAREN ) 1 % GEL [id number] Yes Apply 4 g topically 4 (four) times daily as needed (knee pain). Tobie Yetta HERO, MD  Active   furosemide  (LASIX ) 40 MG tablet [id number] Yes Take 1 tablet (40 mg total) by mouth daily. Patel, Pranav M, MD  Active   loteprednol (LOTEMAX) 0.5 % ophthalmic suspension [id number] Yes 1 DROP IN EACH EYE 4 TIMES DAILY FOR 2 WEEKS THEN TWICE DAILY  FOR 4 WEEKS [provider]  Active   nadolol  (CORGARD ) 20 MG tablet [id number] Yes Take 1 tablet (20 mg total) by mouth daily. Beather Delon Gibson, [place]  Active   oxyCODONE  (OXY IR/ROXICODONE ) 5 MG immediate release tablet [id number] Yes Take 1 tablet (5 mg total) by mouth every 8 (eight) hours as needed for severe pain (pain score 7-10). Tobie Yetta HERO, MD  Active   pantoprazole  (PROTONIX ) 40 MG tablet [id number] Yes Take 1 tablet (40 mg total) by mouth 2 (two) times daily.  Patient taking differently: Take 40 mg by mouth daily.   Berneta Elsie Sayre, MD  Active   polyethylene glycol (MIRALAX  / GLYCOLAX ) 17 g packet [id number] Yes Take 17 g by mouth daily as needed for mild constipation. Tobie Yetta HERO, MD  Active   predniSONE  (DELTASONE ) 10 MG tablet [id number] Yes Take 50mg  daily for 3days,Take 40mg  daily for 3days,Take 30mg  daily for 3days,Take 20mg  daily for 3days,Take 10mg  daily Patel, Pranav M, MD  Active   spironolactone  (ALDACTONE ) 25 MG tablet [id number] Yes Take 1 tablet (25 mg total) by mouth daily. Tobie Yetta HERO, MD  Active             Home Care and Equipment/Supplies: Were Home Health Services Ordered?: NA Any new equipment or medical supplies ordered?: NA  Functional Questionnaire: Do you need assistance with bathing/showering or dressing?: No Do you need assistance with meal preparation?: No Do you need assistance with eating?: No Do you have difficulty maintaining continence: No Do you need assistance with getting out of bed/getting out of a chair/moving?: No Do you have difficulty managing or taking your medications?: No  Follow up appointments reviewed: PCP Follow-up appointment confirmed?: Yes Date of PCP follow-up appointment?: 03/10/24 Follow-up Provider: [facility] Follow-up appointment confirmed?: Yes Date of Specialist follow-up appointment?: 03/02/24 Follow-Up Specialty Provider:: rheu Do you need transportation to your  follow-up appointment?: No Do you understand care options if your condition(s) worsen?: Yes-patient verbalized understanding    SIGNATURE Julian Lemmings, LPN [facility] Nurse Health Advisor Direct Dial [phone]

## 2024-02-25 NOTE — Discharge Summary (Signed)
 Physician Discharge Summary   Patient: Thomas [name] MRN: [id number] DOB: [birth year]  Admit date:     02/20/2024  Discharge date: 02/24/2024  Discharge Physician: Yetta Blanch  PCP: Berneta Elsie Sayre, MD  Recommendations at discharge: Follow-up with PCP with a BMP. Follow-up with rheumatology dermatology as recommended. Follow-up with orthopedic   Follow-up Information     Jeannetta Lonni ORN, MD. Go on 03/03/2024.   Specialty: Rheumatology Contact information: [address] [place] [address] [phone]         Berneta Elsie Sayre, MD. Schedule an appointment as soon as possible for a visit in 1 week(s).   Specialty: Family Medicine Why: with BMP lab to look at kidney/electrolyte numbers Contact information: [address] [place] [address] [phone]         Specialists, [facility]. Schedule an appointment as soon as possible for a visit in 1 month(s).   Specialty: Orthopedic Surgery Contact information: [facility] [address] [place] [address] [phone]         Dermatology Follow up.   Why: the Appointment As Scheduled               Hospital Course: BRYTEN MAHER is a 48 y.o. male with a history of decompensated alcoholic cirrhosis with ascites, esophageal varices, splenomegaly, psoriasis.  Patient presented secondary to worsening right ankle pain with associated swelling in addition to shortness of breath secondary to right-sided pleural effusion. Thoracentesis ordered and orthopedic surgery consulted.  Right-sided pleural effusion Likely hepatic hydrothorax IR consulted and performed a thoracentesis on 9/13, yielding 650 mL of amber fluid, transudative Repeat chest x-ray showed hazy right lung base opacity compatible with pleural effusion and atelectasis associated with hepatic disorder I do not see pleural fluid cytology.  Pathologist smear review  revealed mesothelial cells.  Without any atypia Continue Lasix  Aldactone .  Patient was taking them as needed only.   Decompensated alcoholic cirrhosis Esophageal varices Portal hypertension Patient with persistently elevated bilirubin Ascites noted on imaging. MELD-Na of 17 Continue Lasix  and spironolactone , patient was taking them as needed only. Continue nadolol  Continue Protonix    Right ankle pain/swelling Concern for psoriatic etiology Afebrile, now with leukocytosis (steroids) History of ankle surgery with hardware in place. X-ray on admission without complicated findings  Orthopedic surgery consulted, unlikely infection, recommend s/p IV Solu-Medrol , then prednisone  taper Discussed extensively with Dr. Lonni Jeannetta with [facility] rheumatology, recommended safest short term option for severe joint inflammation would probably be medrol  or doing local aspiration/injection.  Scheduled outpatient follow-up with him on 03/03/2024 to establish care   Alcohol use disorder Continues to drink socially Patient counseled to quit alcohol   Splenomegaly Thrombocytopenia Related to cirrhosis   Psoriasis Patient with a skin flare. Patient is not on maintenance therapy. He was previously seen by rheumatology (office closed), has an appointment next month in October with a new dermatologist Continue clobetasol  started for management of skin rashes Patient was previously on Skyrizi    Pain control - [place]  Controlled Substance Reporting System database was reviewed. and patient was instructed, not to drive, operate heavy machinery, perform activities at heights, swimming or participation in water activities or provide baby-sitting services while on Pain, Sleep and Anxiety Medications; until their outpatient Physician has advised to do so again. Also recommended to not to take more than prescribed Pain, Sleep and Anxiety Medications.  Consultants:  Orthopedics  Procedures  performed:  None  DISCHARGE MEDICATION: Allergies as  of 02/24/2024       Reactions   Codeine Other (See Comments)   Jittery         Medication List     STOP taking these medications    diphenoxylate -atropine  2.5-0.025 MG tablet Commonly known as: Lomotil        TAKE these medications    acetaminophen  325 MG tablet Commonly known as: TYLENOL  Take 1-2 tablets (325-650 mg total) by mouth every 4 (four) hours as needed for mild pain.   clobetasol  cream 0.05 % Commonly known as: TEMOVATE  Apply topically 2 (two) times daily. Apply to Psoriatic plaques on the extremities, trunk or scalp. DO not apply to face, intertriginous areas (armpits, groin, under pannus, gluteal cleft), or genitals.   diclofenac  Sodium 1 % Gel Commonly known as: VOLTAREN  Apply 4 g topically 4 (four) times daily as needed (knee pain).   furosemide  40 MG tablet Commonly known as: LASIX  Take 1 tablet (40 mg total) by mouth daily. What changed:  when to take this reasons to take this   loteprednol 0.5 % ophthalmic suspension Commonly known as: LOTEMAX 1 DROP IN EACH EYE 4 TIMES DAILY FOR 2 WEEKS THEN TWICE DAILY FOR 4 WEEKS   nadolol  20 MG tablet Commonly known as: Corgard  Take 1 tablet (20 mg total) by mouth daily.   oxyCODONE  5 MG immediate release tablet Commonly known as: Oxy IR/ROXICODONE  Take 1 tablet (5 mg total) by mouth every 8 (eight) hours as needed for severe pain (pain score 7-10).   pantoprazole  40 MG tablet Commonly known as: PROTONIX  Take 1 tablet (40 mg total) by mouth 2 (two) times daily. What changed: when to take this   polyethylene glycol 17 g packet Commonly known as: MIRALAX  / GLYCOLAX  Take 17 g by mouth daily as needed for mild constipation.   predniSONE  10 MG tablet Commonly known as: DELTASONE  Take 50mg  daily for 3days,Take 40mg  daily for 3days,Take 30mg  daily for 3days,Take 20mg  daily for 3days,Take 10mg  daily   spironolactone  25 MG tablet Commonly known as:  ALDACTONE  Take 1 tablet (25 mg total) by mouth daily.       Disposition: Home Diet recommendation: Cardiac diet  Discharge Exam: Vitals:   02/23/24 1545 02/23/24 2133 02/24/24 0653 02/24/24 0717  BP: (!) 108/53 120/78 133/69 124/68  Pulse: (!) 58  (!) 57 (!) 57  Resp: 18 18 16 16   Temp: 98.2 F (36.8 C) 98.5 F (36.9 C) 97.9 F (36.6 C) 98.4 F (36.9 C)  TempSrc: Oral Oral Oral Oral  SpO2: 95%  96% 94%  Weight:      Height:       Clear to auscultation. S1-S2 present Bowel sound present Trace edema.  Right more than left. Improving erythema of the right lower extremity lesions.  Filed Weights   02/20/24 1719 02/21/24 0653 02/22/24 0600  Weight: 125.2 kg 117.5 kg 114.7 kg   Condition at discharge: stable  The results of significant diagnostics from this hospitalization (including imaging, microbiology, ancillary and laboratory) are listed below for reference.   Imaging Studies: DG Chest 2 View Result Date: 02/22/2024 EXAM: 2 VIEW(S) XRAY OF THE CHEST 02/22/2024 05:18:00 AM COMPARISON: 02/21/2024 CLINICAL HISTORY: Pleural effusion associated with hepatic disorder FINDINGS: LUNGS AND PLEURA: Hazy right lung base opacity compatible with pleural effusion and atelectasis. No pneumothorax. HEART AND MEDIASTINUM: No acute abnormality of the cardiac and mediastinal silhouettes. BONES AND SOFT TISSUES: No acute osseous abnormality. IMPRESSION: 1. Hazy right lung base opacity compatible with pleural effusion and atelectasis,  associated with hepatic disorder. Electronically signed by: Evalene Coho MD 02/22/2024 05:32 AM EDT RP Workstation: [id number]   US  THORACENTESIS ASP PLEURAL SPACE W/IMG GUIDE Result Date: 02/21/2024 INDICATION: 48 year old male with a history of decompensated cirrhosis and shortness of breath. Found to have large right pleural effusion. Request for diagnostic and therapeutic thoracentesis. EXAM: ULTRASOUND GUIDED DIAGNOSTIC AND THERAPEUTIC, RIGHT-SIDED  THORACENTESIS MEDICATIONS: 1% lidocaine , 10 mL. COMPLICATIONS: None immediate. PROCEDURE: An ultrasound guided thoracentesis was thoroughly discussed with the patient and questions answered. The benefits, risks, alternatives and complications were also discussed. The patient understands and wishes to proceed with the procedure. Written consent was obtained. Ultrasound was performed to localize and mark an adequate pocket of fluid in the right chest. The area was then prepped and draped in the normal sterile fashion. 1% Lidocaine  was used for local anesthesia. Under ultrasound guidance a 6 Fr Safe-T-Centesis catheter was introduced. Thoracentesis was performed. The catheter was removed and a dressing applied. FINDINGS: A total of approximately 650 mL of amber fluid was removed. Patient blood pressure started to drop during procedure the procedure was terminated early. Samples were sent to the laboratory as requested by the clinical team. IMPRESSION: Successful ultrasound guided right-sided thoracentesis yielding 650 mL of pleural fluid. Procedure performed by: Sherrilee Bal, PA-C under the supervision of Dr. JONETTA Faes Electronically Signed   By: JONETTA Faes M.D.   On: 02/21/2024 11:51   DG Chest 1 View Result Date: 02/21/2024 CLINICAL DATA:  Pleural effusion, status post right thoracentesis EXAM: CHEST  1 VIEW COMPARISON:  02/20/2024 FINDINGS: Right basilar and right midlung hazy opacity with obscuration of the right hemidiaphragm compatible with atelectasis and/or pleural effusion. Minimally improved aeration in this vicinity compared to 02/20/2024. No visible pneumothorax. Left lung appears clear. Mild enlargement of the cardiopericardial silhouette. IMPRESSION: 1. No pneumothorax identified. 2. Right basilar and right midlung hazy opacity compatible with atelectasis and/or pleural effusion. Minimally improved aeration in this vicinity compared to 02/20/2024. 3. Mild enlargement of the cardiopericardial  silhouette. Electronically Signed   By: Ryan Salvage M.D.   On: 02/21/2024 10:38   US  Abdomen Limited Result Date: 02/21/2024 CLINICAL DATA:  Abdominal distension EXAM: LIMITED ABDOMEN ULTRASOUND FOR ASCITES TECHNIQUE: Limited ultrasound survey for ascites was performed in all four abdominal quadrants. COMPARISON:  None Available. FINDINGS: Scanning in all 4 quadrants shows evidence mild ascites. Prominent right pleural effusion is again noted. IMPRESSION: Mild ascites. Large right-sided pleural effusion. Electronically Signed   By: Oneil Devonshire M.D.   On: 02/21/2024 01:17   CT Angio Chest PE W and/or Wo Contrast Result Date: 02/20/2024 CLINICAL DATA:  Concern for pulmonary embolism. EXAM: CT ANGIOGRAPHY CHEST WITH CONTRAST TECHNIQUE: Multidetector CT imaging of the chest was performed using the standard protocol during bolus administration of intravenous contrast. Multiplanar CT image reconstructions and MIPs were obtained to evaluate the vascular anatomy. RADIATION DOSE REDUCTION: This exam was performed according to the departmental dose-optimization program which includes automated exposure control, adjustment of the mA and/or kV according to patient size and/or use of iterative reconstruction technique. CONTRAST:  75mL OMNIPAQUE  IOHEXOL  350 MG/ML SOLN COMPARISON:  Chest radiograph dated 02/20/2024. FINDINGS: Evaluation of this exam is limited due to respiratory motion. Cardiovascular: Top-normal cardiac size. No pericardial effusion. The thoracic aorta is unremarkable. The origins of the great vessels of the aortic arch appear patent. Evaluation of the pulmonary arteries is limited due to respiratory motion. No central pulmonary artery embolus identified. Mediastinum/Nodes: No hilar adenopathy. The esophagus is grossly unremarkable  no mediastinal fluid collection. Lungs/Pleura: Large right pleural effusion. There is complete consolidation of the right lower lobe and partial consolidation of the  right middle lobe which may represent atelectasis. A pneumonia or underlying mass is not excluded. Clinical correlation and follow-up to resolution recommended. Tiny left upper lobe calcified granuloma. No pneumothorax. The central airways are patent. Upper Abdomen: Cirrhosis, ascites, and splenomegaly. Musculoskeletal: No acute osseous pathology. Review of the MIP images confirms the above findings. IMPRESSION: 1. No CT evidence of central pulmonary artery embolus. 2. Large right pleural effusion with compressive atelectasis of the right lower and right middle lobes. Pneumonia or underlying mass are not excluded. Clinical correlation and follow-up to resolution recommended. 3. Cirrhosis, ascites, and splenomegaly. Electronically Signed   By: Vanetta Chou M.D.   On: 02/20/2024 20:35   DG Ankle Complete Right Result Date: 02/20/2024 CLINICAL DATA:  Right ankle pain for 3.5 weeks EXAM: RIGHT ANKLE - COMPLETE 3+ VIEW COMPARISON:  02/05/2024 FINDINGS: Frontal, oblique, and lateral views of the right ankle are obtained. Postsurgical changes are seen from prior bimalleolar fracture repair. No acute fracture, subluxation, or dislocation. Mild osteoarthritis of the ankle, hindfoot, and midfoot. Mild diffuse subcutaneous edema unchanged. IMPRESSION: 1. Stable soft tissue edema. 2. No acute bony abnormality. Electronically Signed   By: Ozell Daring M.D.   On: 02/20/2024 19:40   DG Chest 2 View Result Date: 02/20/2024 CLINICAL DATA:  Shortness of breath. EXAM: CHEST - 2 VIEW COMPARISON:  Chest radiograph dated 02/22/2024. FINDINGS: Small right pleural effusion and right lung base atelectasis or infiltrate similar to prior radiograph. No pneumothorax. Stable cardiac silhouette. No acute osseous pathology. IMPRESSION: Small right pleural effusion and right lung base atelectasis or infiltrate. Electronically Signed   By: Vanetta Chou M.D.   On: 02/20/2024 18:48   DG Chest 2 View Result Date: 02/20/2024 EXAM: 2  VIEW(S) XRAY OF THE CHEST 02/20/2024 04:00:00 PM COMPARISON: None available. CLINICAL HISTORY: Shortness of breath with exertion for 5 days, non-alcoholic fatty liver, jaundice. FINDINGS: LUNGS AND PLEURA: Right lower lung opacity. Moderate right pleural effusion. No pneumothorax. HEART AND MEDIASTINUM: No acute abnormality of the cardiac and mediastinal silhouettes. BONES AND SOFT TISSUES: No acute osseous abnormality. IMPRESSION: 1. Moderate right pleural effusion and lower lung opacity, new since previous . Electronically signed by: Katheleen Faes MD 02/20/2024 04:20 PM EDT RP Workstation: [id number]   DG Ankle Complete Right Result Date: 02/05/2024 CLINICAL DATA:  Right ankle pain. EXAM: RIGHT ANKLE - COMPLETE 3+ VIEW COMPARISON:  January 10, 2023. FINDINGS: Status post surgical internal fixation of old distal right tibial and fibular fractures. No acute fracture or dislocation is noted. IMPRESSION: No acute abnormality seen. Electronically Signed   By: Lynwood Landy Raddle M.D.   On: 02/05/2024 12:09    Microbiology: Results for orders placed or performed during the hospital encounter of 02/20/24  Body fluid culture w Gram Stain     Status: None   Collection Time: 02/21/24 10:44 AM   Specimen: Lung, Right; Pleural Fluid  Result Value Ref Range Status   Specimen Description PLEURAL  Final   Special Requests LUNG,RIGHT  Final   Gram Stain   Final    RARE WBC PRESENT, PREDOMINANTLY MONONUCLEAR NO ORGANISMS SEEN    Culture   Final    NO GROWTH 3 DAYS Performed at [facility], [address] [place], [place] [address]    Report Status 02/24/2024 FINAL  Final   Labs: CBC: Recent Labs  Lab 02/20/24  1723 02/21/24 0513 02/23/24 0212 02/24/24 0215  WBC 5.1 4.2 16.0* 12.1*  NEUTROABS  --   --  14.5* 10.7*  HGB 12.7* 12.0* 12.7* 12.4*  HCT 39.0 35.9* 37.7* 36.9*  MCV 97.7 95.7 94.3 95.6  PLT 136* 123* 122* 103*   Basic Metabolic Panel: Recent Labs  Lab 02/20/24 1723 02/21/24 0513  02/23/24 0212 02/24/24 0215  NA 136 138 137 136  K 4.4 3.7 4.6 4.2  CL 103 103 104 104  CO2 25 28 27 28   GLUCOSE 111* 97 124* 110*  BUN 9 7 10 10   CREATININE 0.65 0.65 0.74 0.82  CALCIUM 8.3* 8.4* 8.6* 8.4*  MG  --  1.7  --   --   PHOS  --  4.0  --   --    Liver Function Tests: Recent Labs  Lab 02/20/24 1922 02/22/24 0202 02/23/24 0212 02/24/24 0215  AST 68* 55* 57* 81*  ALT 37 36 35 47*  ALKPHOS 203* 199* 180* 186*  BILITOT 4.0* 4.1* 3.3* 2.3*  PROT 6.3* 7.0 6.5 6.1*  ALBUMIN  2.2* 2.2* 2.2* 2.0*   CBG: No results for input(s): GLUCAP in the last 168 hours.  Discharge time spent: greater than 30 minutes.  Author: Yetta Blanch, MD  [facility] 02/24/2024

## 2024-02-29 ENCOUNTER — Encounter: Payer: Self-pay | Admitting: Internal Medicine

## 2024-02-29 ENCOUNTER — Ambulatory Visit: Attending: Internal Medicine | Admitting: Internal Medicine

## 2024-02-29 ENCOUNTER — Telehealth: Payer: Self-pay

## 2024-02-29 VITALS — BP 117/64 | HR 66 | Temp 98.4°F | Resp 17 | Ht 65.0 in | Wt 252.8 lb

## 2024-02-29 DIAGNOSIS — K7031 Alcoholic cirrhosis of liver with ascites: Secondary | ICD-10-CM | POA: Diagnosis not present

## 2024-02-29 DIAGNOSIS — L409 Psoriasis, unspecified: Secondary | ICD-10-CM

## 2024-02-29 DIAGNOSIS — J918 Pleural effusion in other conditions classified elsewhere: Secondary | ICD-10-CM

## 2024-02-29 DIAGNOSIS — Z79899 Other long term (current) drug therapy: Secondary | ICD-10-CM

## 2024-02-29 DIAGNOSIS — K769 Liver disease, unspecified: Secondary | ICD-10-CM | POA: Diagnosis not present

## 2024-02-29 DIAGNOSIS — Z111 Encounter for screening for respiratory tuberculosis: Secondary | ICD-10-CM

## 2024-02-29 DIAGNOSIS — L4050 Arthropathic psoriasis, unspecified: Secondary | ICD-10-CM | POA: Diagnosis not present

## 2024-02-29 MED ORDER — METHYLPREDNISOLONE 8 MG PO TABS: ORAL_TABLET | ORAL | 0 refills | Status: DC

## 2024-02-29 NOTE — Patient Instructions (Signed)
 Secukinumab Injection What is this medication? SECUKINUMAB (sek ue KIN ue mab) treats autoimmune conditions, such as arthritis and psoriasis. It works by slowing down an overactive immune system.  It may also be used to treat hidradenitis suppurativa (HS). HS is a condition that causes painful lumps under the skin in areas such as the armpits and groin. It is a monoclonal antibody. This medicine may be used for other purposes; ask your health care provider or pharmacist if you have questions. COMMON BRAND NAME(S): Cosentyx What should I tell my care team before I take this medication? They need to know if you have any of these conditions: Crohn's disease, ulcerative colitis, or other inflammatory bowel disease Immune system problems Infection or history of infection, such as a viral infection, chickenpox, cold sores, or herpes Recently received or are scheduled to receive a vaccine Tuberculosis, a positive skin test for tuberculosis, or recent close contact with someone who has tuberculosis An unusual or allergic reaction to secukinumab, latex, rubber, other medications, foods, dyes, or preservatives Pregnant or trying to get pregnant Breast-feeding How should I use this medication? This medication is injected into a vein or under the skin. It is given by your care team in a hospital or clinic setting if it is injected into a vein. If it is injected under the skin, it may be given at home. If you get this medication at home, you will be taught how to prepare and give it. Use it exactly as directed. Take it as directed on the prescription label. Keep taking it unless your care team tells you to stop. It is important that you put your used needles and syringes in a special sharps container. Do not put them in a trash can. If you do not have a sharps container, call your pharmacist or care team to get one. A special MedGuide will be given to you by the pharmacist with each prescription and refill. Be  sure to read this information carefully each time. Talk to your care team about the use of this medication in children. While it may be prescribed for children as young as 2 years for selected conditions, precautions do apply. Overdosage: If you think you have taken too much of this medicine contact a poison control center or emergency room at once. NOTE: This medicine is only for you. Do not share this medicine with others. What if I miss a dose? If you get this medication at the hospital or clinic: It is important not to miss your dose. Call your care team if you are unable to keep an appointment. If you give yourself this medication at home: If you miss a dose, take it as soon as you can. If it is almost time for your next dose, take only that dose. Do not take double or extra doses. Call your care team with questions. What may interact with this medication? Live virus vaccines This list may not describe all possible interactions. Give your health care provider a list of all the medicines, herbs, non-prescription drugs, or dietary supplements you use. Also tell them if you smoke, drink alcohol, or use illegal drugs. Some items may interact with your medicine. What should I watch for while using this medication? Visit your care team for regular checks on your progress. Tell your care team if your symptoms do not start to get better or if they get worse. You will be tested for tuberculosis (TB) before you start this medication. If your care team prescribes  any medication for TB, you should start taking the TB medication before starting this medication. Make sure to finish the full course of TB medication. This medication may increase your risk of getting an infection. Call your care team for advice if you get a fever, chills, sore throat, or other symptoms of a cold or flu. Do not treat yourself. Try to avoid being around people who are sick. This medication can decrease the response to a vaccine. If  you need to get vaccinated, tell your care team if you have received this medication within the last 6 months. Extra booster doses may be needed. Talk to your care team to see if a different vaccination schedule is needed. What side effects may I notice from receiving this medication? Side effects that you should report to your care team as soon as possible: Allergic reactions--skin rash, itching, hives, swelling of the face, lips, tongue, or throat Dry, itchy, scaly patches of skin that blister or peel Infection--fever, chills, cough, sore throat, wounds that don't heal, pain or trouble when passing urine, general feeling of discomfort or being unwell Sudden or severe stomach pain, bloody diarrhea, fever, nausea, vomiting Side effects that usually do not require medical attention (report these to your care team if they continue or are bothersome): Diarrhea Runny or stuffy nose Sore throat This list may not describe all possible side effects. Call your doctor for medical advice about side effects. You may report side effects to FDA at 1-800-FDA-1088. Where should I keep my medication? Keep out of the reach of children and pets. Store in the refrigerator. Do not freeze. Keep it in the original carton until you are ready to use it. Protect from light. Do not shake. Remove the dose from the refrigerator about 30 minutes before it is time for you to use it. Use it within 4 days of removing it from the carton. Get rid of any unused medication after the expiration date. To get rid of medications that are no longer needed or have expired: Take the medication to a medication take-back program. Check with your pharmacy or law enforcement to find a location. If you cannot return the medication, ask your pharmacist or care team how to get rid of this medication safely. NOTE: This sheet is a summary. It may not cover all possible information. If you have questions about this medicine, talk to your doctor,  pharmacist, or health care provider.  2024 Elsevier/Gold Standard (2023-05-08 00:00:00)

## 2024-02-29 NOTE — Telephone Encounter (Signed)
 Graves, Marissa C, CMA  P Rx Rheum/Pulm Please apply for cosentyx, per Dr. Jeannetta. Consent obtained and sent to the scan center. Thanks! -------------------------------------------------------------------------------------------------  Initiation of PA processing pending OV note completion.

## 2024-02-29 NOTE — Progress Notes (Signed)
 Office Visit Note  Patient: Thomas [name]             Date of Birth: [birth year]           MRN: [id number]             PCP: Berneta Elsie Sayre, MD Referring: Berneta Elsie Sayre,* Visit Date: 02/29/2024 Occupation: Data Unavailable  Subjective:   Discussed the use of AI scribe software for clinical note transcription with the patient, who gave verbal consent to proceed.  History of Present Illness   Thomas [name] is a 48 year old male with psoriasis who presents with joint inflammation and swelling.  Years ago, he experienced joint pain in his fingers and elbows, initially suspecting gout. Tests ruled out gout and revealed psoriasis affecting his joints. He was treated with Skyrizi for over a year, which alleviated pain but did not improve itching or skin patches. He discontinued Skyrizi after his dermatologist closed his practice and never got established elsewhere.  Approximately a year ago, he broke his ankle, which was rehabilitated successfully, allowing him to resume activities like dancing and golfing. However, a month ago, during a stressful period when his father was hospitalized, he experienced a psoriasis flare-up with associated leg swelling. He resumed Lasix , which reduced swelling except in the ankle. The swelling worsened, leading to an inability to walk.  He sought urgent care, receiving a cortisone shot and a prednisone  pack, which provided temporary relief. 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 He received Solu-Medrol  and prednisone , improving his mobility partially. He completed the prednisone  pack but is unsure about further medication use.  His psoriasis primarily affects his legs, with rashes localized there. No rash on his scalp and no issues with bruising easily. He has never experienced  fluid retention before and questions if recent inactivity contributed to this issue.      Labs reviewed 2025 HAV Ab pos HBV Ab neg  08/2021 Quantiferon neg HCV Ab neg  Activities of Daily Living:  Patient reports morning stiffness for all day. Patient Reports nocturnal pain.  Difficulty dressing/grooming: Denies Difficulty climbing stairs: Reports Difficulty getting out of chair: Denies Difficulty using hands for taps, buttons, cutlery, and/or writing: Denies  Review of Systems  Constitutional:  Negative for fatigue.  HENT:  Negative for mouth sores and mouth dryness.   Eyes:  Negative for dryness.  Respiratory:  Negative for shortness of breath.   Cardiovascular:  Negative for chest pain and palpitations.  Gastrointestinal:  Negative for blood in stool, constipation and diarrhea.  Endocrine: Negative for increased urination.  Genitourinary:  Negative for involuntary urination.  Musculoskeletal:  Positive for joint pain, gait problem, joint pain, joint swelling and morning stiffness. Negative for myalgias, muscle weakness, muscle tenderness and myalgias.  Skin:  Negative for color change, rash, hair loss and sensitivity to sunlight.  Allergic/Immunologic: Negative for susceptible to infections.  Neurological:  Negative for dizziness and headaches.  Hematological:  Negative for swollen glands.  Psychiatric/Behavioral:  Positive for sleep disturbance. Negative for depressed mood. The patient is not nervous/anxious.     PMFS History:  Patient Active Problem List   Diagnosis Date Noted   High risk medication use 02/29/2024   Pleural effusion associated with hepatic disorder 02/20/2024   Cellulitis of abdominal wall 03/10/2023   Esophageal varices without bleeding (HCC) 02/13/2023  History of ETOH abuse 01/09/2023   Ankle fracture 01/07/2023   Localized edema 01/07/2023   Closed right ankle fracture 01/06/2023   Thrombocytopenia 01/06/2023   Cirrhosis of liver (HCC)  12/25/2022   Secondary esophageal varices with bleeding (HCC) 12/25/2022   Reactive airway disease 07/28/2022   Acute upper GI bleed 07/05/2022   Hematemesis with nausea 07/05/2022   Melena 07/05/2022   Pharyngitis 10/22/2021   Psoriatic arthritis (HCC) 10/03/2021   Viral upper respiratory tract infection 10/03/2021   Acute blood loss anemia 08/26/2021   Screening for tuberculosis 08/15/2021   Elevated BP without diagnosis of hypertension 06/14/2021   Need for immunization against influenza 06/14/2021   Esophageal varices in alcoholic cirrhosis (HCC) 09/26/2019   Class 3 severe obesity due to excess calories with body mass index (BMI) of 50.0 to 59.9 in adult 09/26/2019   Healthcare maintenance 02/07/2019   Psoriasis 02/07/2019   Vitamin D  deficiency 05/07/2016   OSA (obstructive sleep apnea) 06/23/2013   Insomnia 06/16/2013   Alcohol use disorder 12/25/2011   Scrotal varices, 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    Gastroenteritis 08/26/2021   Hand pain, left 07/28/2022   Hordeolum externum of right upper eyelid 08/14/2022   IDA (iron deficiency anemia)    Morbid obesity (HCC)    Scrotal varices 01/07/2010   Followed at [facility] urology.    Sleep apnea     Family History  Problem Relation Age of Onset   Liver disease Mother    Depression Mother    Liver disease Father    Diabetes Father    Hypertension Father    Liver disease Sister    Diabetes Other    Colon cancer Neg Hx    Esophageal cancer Neg Hx    Stomach  cancer Neg Hx    Colon polyps Neg Hx    Past Surgical History:  Procedure Laterality Date   ESOPHAGEAL BANDING  12/26/2022   Procedure: ESOPHAGEAL BANDING;  Surgeon: Shila Gustav GAILS, MD;  Location: [facility];  Service: Gastroenterology;;   ESOPHAGOGASTRODUODENOSCOPY (EGD) WITH PROPOFOL  N/A 07/05/2022   Procedure: ESOPHAGOGASTRODUODENOSCOPY (EGD) WITH PROPOFOL ;  Surgeon: Shila Gustav GAILS, MD;  Location: [facility];  Service: Gastroenterology;  Laterality: N/A;   ESOPHAGOGASTRODUODENOSCOPY (EGD) WITH PROPOFOL  N/A 12/26/2022   Procedure: ESOPHAGOGASTRODUODENOSCOPY (EGD) WITH PROPOFOL ;  Surgeon: Shila Gustav GAILS, MD;  Location: [facility];  Service: Gastroenterology;  Laterality: N/A;   ESOPHAGOGASTRODUODENOSCOPY (EGD) WITH PROPOFOL  N/A 02/12/2023   Procedure: ESOPHAGOGASTRODUODENOSCOPY (EGD) WITH PROPOFOL ;  Surgeon: Shila Gustav GAILS, MD;  Location: [facility];  Service: Gastroenterology;  Laterality: N/A;   ORIF ANKLE FRACTURE Right 12/28/2022   Procedure: OPEN REDUCTION INTERNAL FIXATION (ORIF) ANKLE FRACTURE;  Surgeon: Josefina Chew, MD;  Location: [facility] OR;  Service: Orthopedics;  Laterality: Right;   Social History   Tobacco Use   Smoking status: Some Days    Types: Cigars    Passive exposure: Past   Smokeless tobacco: Never   Tobacco comments:    Cigars  occasional  Vaping Use   Vaping status: Never Used  Substance Use Topics   Alcohol use: Not Currently    Comment: Sober since November 2024   Drug use: Never    Comment: history of cocaine abuse   Social History   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               Immunization History  Administered Date(s) Administered   Fluzone Influenza virus vaccine,trivalent (IIV3), split virus 05/06/2016   Hepatitis A 04/27/2012   Hepatitis A, Adult 03/15/2019, 10/11/2019   Hepatitis B 04/27/2012, 06/07/2012   Hepatitis B, ADULT 04/27/2012, 06/07/2012, 03/15/2019,  08/17/2019, 10/11/2019   Influenza, Seasonal, Injecte, Preservative Fre 03/10/2023   Influenza,inj,Quad PF,6+ Mos 05/06/2016, 08/05/2018, 02/07/2019, 06/14/2021, 05/13/2022   PFIZER(Purple Top)SARS-COV-2 Vaccination 08/17/2019, 09/01/2019, 09/19/2019   Pneumococcal Polysaccharide-23 05/06/2016   Tdap 04/27/2012, 05/13/2022     Objective: Vital Signs: BP 117/64   Pulse 66   Temp 98.4 F (36.9 C)   Resp 17   Ht 5' 5 (1.651 m)   Wt 252 lb 12.8 oz (114.7 kg)   BMI 42.07 kg/m    Physical Exam Eyes:     Conjunctiva/sclera: Conjunctivae normal.  Cardiovascular:     Rate and Rhythm: Normal rate and regular rhythm.  Pulmonary:     Effort: Pulmonary effort is normal.  Lymphadenopathy:     Cervical: No cervical adenopathy.  Skin:    General: Skin is warm and dry.     Findings: Bruising present.     Comments: Mild skin inflammation on anterior shin, no overlying plaque  Neurological:     Mental Status: He is alert.  Psychiatric:        Mood and Affect: Mood normal.      Musculoskeletal Exam:  Shoulders full ROM no tenderness or swelling Elbows full ROM no tenderness or swelling Wrists full ROM no tenderness or swelling Fingers full ROM no tenderness or swelling Knees full ROM no tenderness or swelling Right ankle pain and prominent soft tissue swelling present    Investigation: No additional findings.  Imaging: DG Chest 2 View Result Date: 02/22/2024 EXAM: 2 VIEW(S) XRAY OF THE CHEST 02/22/2024 05:18:00 AM COMPARISON: 02/21/2024 CLINICAL HISTORY: Pleural effusion associated with hepatic disorder FINDINGS: LUNGS AND PLEURA: Hazy right lung base opacity compatible with pleural effusion and atelectasis. No pneumothorax. HEART AND MEDIASTINUM: No acute abnormality of the cardiac and mediastinal silhouettes. BONES AND SOFT TISSUES: No acute osseous abnormality. IMPRESSION: 1. Hazy right lung base opacity compatible with pleural effusion and atelectasis, associated with hepatic  disorder. Electronically signed by: Evalene Coho MD 02/22/2024 05:32 AM EDT RP Workstation: [id number]   US  THORACENTESIS ASP PLEURAL SPACE W/IMG GUIDE Result Date: 02/21/2024 INDICATION: 48 year old male with a history of decompensated cirrhosis and shortness of breath. Found to have large right pleural effusion. Request for diagnostic and therapeutic thoracentesis. EXAM: ULTRASOUND GUIDED DIAGNOSTIC AND THERAPEUTIC, RIGHT-SIDED THORACENTESIS MEDICATIONS: 1% lidocaine , 10 mL. COMPLICATIONS: None immediate. PROCEDURE: An ultrasound guided thoracentesis was thoroughly discussed with the patient and questions answered. The benefits, risks, alternatives and complications were also discussed. The patient understands and wishes to proceed with the procedure. Written consent was obtained. Ultrasound was performed to localize and mark an adequate pocket of fluid in the right chest. The area was then prepped and draped in the normal sterile fashion. 1% Lidocaine  was used for local anesthesia. Under ultrasound guidance a 6 Fr Safe-T-Centesis catheter was introduced. Thoracentesis was performed.  The catheter was removed and a dressing applied. FINDINGS: A total of approximately 650 mL of amber fluid was removed. Patient blood pressure started to drop during procedure the procedure was terminated early. Samples were sent to the laboratory as requested by the clinical team. IMPRESSION: Successful ultrasound guided right-sided thoracentesis yielding 650 mL of pleural fluid. Procedure performed by: Sherrilee Bal, PA-C under the supervision of Dr. JONETTA Faes Electronically Signed   By: JONETTA Faes M.D.   On: 02/21/2024 11:51   DG Chest 1 View Result Date: 02/21/2024 CLINICAL DATA:  Pleural effusion, status post right thoracentesis EXAM: CHEST  1 VIEW COMPARISON:  02/20/2024 FINDINGS: Right basilar and right midlung hazy opacity with obscuration of the right hemidiaphragm compatible with atelectasis and/or pleural  effusion. Minimally improved aeration in this vicinity compared to 02/20/2024. No visible pneumothorax. Left lung appears clear. Mild enlargement of the cardiopericardial silhouette. IMPRESSION: 1. No pneumothorax identified. 2. Right basilar and right midlung hazy opacity compatible with atelectasis and/or pleural effusion. Minimally improved aeration in this vicinity compared to 02/20/2024. 3. Mild enlargement of the cardiopericardial silhouette. Electronically Signed   By: Ryan Salvage M.D.   On: 02/21/2024 10:38   US  Abdomen Limited Result Date: 02/21/2024 CLINICAL DATA:  Abdominal distension EXAM: LIMITED ABDOMEN ULTRASOUND FOR ASCITES TECHNIQUE: Limited ultrasound survey for ascites was performed in all four abdominal quadrants. COMPARISON:  None Available. FINDINGS: Scanning in all 4 quadrants shows evidence mild ascites. Prominent right pleural effusion is again noted. IMPRESSION: Mild ascites. Large right-sided pleural effusion. Electronically Signed   By: Oneil Devonshire M.D.   On: 02/21/2024 01:17   CT Angio Chest PE W and/or Wo Contrast Result Date: 02/20/2024 CLINICAL DATA:  Concern for pulmonary embolism. EXAM: CT ANGIOGRAPHY CHEST WITH CONTRAST TECHNIQUE: Multidetector CT imaging of the chest was performed using the standard protocol during bolus administration of intravenous contrast. Multiplanar CT image reconstructions and MIPs were obtained to evaluate the vascular anatomy. RADIATION DOSE REDUCTION: This exam was performed according to the departmental dose-optimization program which includes automated exposure control, adjustment of the mA and/or kV according to patient size and/or use of iterative reconstruction technique. CONTRAST:  75mL OMNIPAQUE  IOHEXOL  350 MG/ML SOLN COMPARISON:  Chest radiograph dated 02/20/2024. FINDINGS: Evaluation of this exam is limited due to respiratory motion. Cardiovascular: Top-normal cardiac size. No pericardial effusion. The thoracic aorta is unremarkable.  The origins of the great vessels of the aortic arch appear patent. Evaluation of the pulmonary arteries is limited due to respiratory motion. No central pulmonary artery embolus identified. Mediastinum/Nodes: No hilar adenopathy. The esophagus is grossly unremarkable no mediastinal fluid collection. Lungs/Pleura: Large right pleural effusion. There is complete consolidation of the right lower lobe and partial consolidation of the right middle lobe which may represent atelectasis. A pneumonia or underlying mass is not excluded. Clinical correlation and follow-up to resolution recommended. Tiny left upper lobe calcified granuloma. No pneumothorax. The central airways are patent. Upper Abdomen: Cirrhosis, ascites, and splenomegaly. Musculoskeletal: No acute osseous pathology. Review of the MIP images confirms the above findings. IMPRESSION: 1. No CT evidence of central pulmonary artery embolus. 2. Large right pleural effusion with compressive atelectasis of the right lower and right middle lobes. Pneumonia or underlying mass are not excluded. Clinical correlation and follow-up to resolution recommended. 3. Cirrhosis, ascites, and splenomegaly. Electronically Signed   By: Vanetta Chou M.D.   On: 02/20/2024 20:35   DG Ankle Complete Right Result Date: 02/20/2024 CLINICAL DATA:  Right ankle pain for 3.5 weeks EXAM:  RIGHT ANKLE - COMPLETE 3+ VIEW COMPARISON:  02/05/2024 FINDINGS: Frontal, oblique, and lateral views of the right ankle are obtained. Postsurgical changes are seen from prior bimalleolar fracture repair. No acute fracture, subluxation, or dislocation. Mild osteoarthritis of the ankle, hindfoot, and midfoot. Mild diffuse subcutaneous edema unchanged. IMPRESSION: 1. Stable soft tissue edema. 2. No acute bony abnormality. Electronically Signed   By: Ozell Daring M.D.   On: 02/20/2024 19:40   DG Chest 2 View Result Date: 02/20/2024 CLINICAL DATA:  Shortness of breath. EXAM: CHEST - 2 VIEW COMPARISON:   Chest radiograph dated 02/22/2024. FINDINGS: Small right pleural effusion and right lung base atelectasis or infiltrate similar to prior radiograph. No pneumothorax. Stable cardiac silhouette. No acute osseous pathology. IMPRESSION: Small right pleural effusion and right lung base atelectasis or infiltrate. Electronically Signed   By: Vanetta Chou M.D.   On: 02/20/2024 18:48   DG Chest 2 View Result Date: 02/20/2024 EXAM: 2 VIEW(S) XRAY OF THE CHEST 02/20/2024 04:00:00 PM COMPARISON: None available. CLINICAL HISTORY: Shortness of breath with exertion for 5 days, non-alcoholic fatty liver, jaundice. FINDINGS: LUNGS AND PLEURA: Right lower lung opacity. Moderate right pleural effusion. No pneumothorax. HEART AND MEDIASTINUM: No acute abnormality of the cardiac and mediastinal silhouettes. BONES AND SOFT TISSUES: No acute osseous abnormality. IMPRESSION: 1. Moderate right pleural effusion and lower lung opacity, new since previous . Electronically signed by: Katheleen Faes MD 02/20/2024 04:20 PM EDT RP Workstation: [id number]    Recent Labs: Lab Results  Component Value Date   WBC 12.1 (H) 02/24/2024   HGB 12.4 (L) 02/24/2024   PLT 103 (L) 02/24/2024   NA 136 02/24/2024   K 4.2 02/24/2024   CL 104 02/24/2024   CO2 28 02/24/2024   GLUCOSE 110 (H) 02/24/2024   BUN 10 02/24/2024   CREATININE 0.82 02/24/2024   BILITOT 2.3 (H) 02/24/2024   ALKPHOS 186 (H) 02/24/2024   AST 81 (H) 02/24/2024   ALT 47 (H) 02/24/2024   PROT 6.1 (L) 02/24/2024   ALBUMIN  2.0 (L) 02/24/2024   CALCIUM 8.4 (L) 02/24/2024   GFRAA >89 06/22/2013   QFTBGOLDPLUS INDETERMINATE (A) 02/29/2024    Speciality Comments: No specialty comments available.  Procedures:  Medium Joint Inj: R ankle on 02/29/2024 2:50 PM Indications: pain and joint swelling Details: 27 G 1.5 in needle, lateral approach Medications: 1 mL lidocaine  1 %; 40 mg triamcinolone  acetonide 40 MG/ML Outcome: tolerated well, no immediate  complications Procedure, treatment alternatives, risks and benefits explained, specific risks discussed. Consent was given by the patient. Immediately prior to procedure a time out was called to verify the correct patient, procedure, equipment, support staff and site/side marked as required. Patient was prepped and draped in the usual sterile fashion.     Allergies: Codeine   Assessment / Plan:     Visit Diagnoses: Psoriatic arthritis (HCC) - Plan: Sedimentation rate, C-reactive protein, Cyclic citrul peptide antibody, IgG, methylPREDNISolone  (MEDROL ) 8 MG tablet, Medium Joint Inj: R ankle Chronic psoriatic arthritis with recent exacerbation, primarily affecting the right ankle. Active cutaneous psoriasis on the legs. Previous treatment with Skyrizi was ineffective for cutaneous symptoms. Current flare possibly triggered by stress. Differential diagnosis includes gout, but uric acid levels are low, although less reliable in acute flare setting he does not have history for this. Methylprednisolone  maybe preferred due to liver metabolism issues with prednisone . - Medrol  steroid taper from 24 mg daily down weekly - Right ankle steroid injection today - Checking serum inflammatory markers, previously elevated at  hospital, checking CCP Abs as well - Plan to start Cosentyx injection for PsA given limited joint respones on IL-23 agent before  High risk medication use - Plan: QuantiFERON-TB Gold Plus Checking Quantiferon screening for baseline anticipating bDMARD start. Reviewed risks of cosentyx including injection site reactions, infections, lab abnormalities.  Cirrhosis of liver with ascites and right pleural effusion Cirrhosis with ascites and right pleural effusion, likely secondary to liver scarring causing back pressure in the portal circulation. Fluid in the abdomen is transudative, indicating non-infectious etiology. Fluid retention may contribute to leg swelling and pleural effusion. No history  of spontaneous bacterial peritonitis. Requiring loop diuretics for volume management. - Monitor fluid levels in the abdomen and pleural space to prevent complications.    Orders: Orders Placed This Encounter  Procedures   Medium Joint Inj: R ankle   QuantiFERON-TB Gold Plus   Sedimentation rate   C-reactive protein   Cyclic citrul peptide antibody, IgG   Meds ordered this encounter  Medications   methylPREDNISolone  (MEDROL ) 8 MG tablet    Sig: Take 3 tablets (24 mg total) by mouth daily for 7 days, THEN 2 tablets (16 mg total) daily for 7 days, THEN 1 tablet (8 mg total) daily for 14 days.    Dispense:  49 tablet    Refill:  0     Follow-Up Instructions: Return in about 4 weeks (around 03/28/2024) for New pt PsA COS start/GC/inj f/u 52mo.   Lonni LELON Ester, MD  Note - This record has been created using [employer].  Chart creation errors have been sought, but may not always  have been located. Such creation errors do not reflect on  the standard of medical care.

## 2024-03-02 LAB — QUANTIFERON-TB GOLD PLUS
Mitogen-NIL: 0.21 [IU]/mL
NIL: 0.01 [IU]/mL
QuantiFERON-TB Gold Plus: UNDETERMINED — AB
TB1-NIL: 0 [IU]/mL
TB2-NIL: 0 [IU]/mL

## 2024-03-02 LAB — CYCLIC CITRUL PEPTIDE ANTIBODY, IGG: Cyclic Citrullin Peptide Ab: <16 U

## 2024-03-02 LAB — C-REACTIVE PROTEIN: CRP: 35.7 mg/L — ABNORMAL HIGH (ref <8.0)

## 2024-03-02 LAB — SEDIMENTATION RATE: Sed Rate: 17 mm/h — ABNORMAL HIGH (ref 0–15)

## 2024-03-07 LAB — TOTAL BILIRUBIN, BODY FLUID

## 2024-03-08 MED ORDER — LIDOCAINE HCL 1 % IJ SOLN
1.0000 mL | INTRAMUSCULAR | Status: AC | PRN
  Administered 2024-02-29: 1 mL

## 2024-03-08 MED ORDER — TRIAMCINOLONE ACETONIDE 40 MG/ML IJ SUSP
40.0000 mg | INTRAMUSCULAR | Status: AC | PRN
  Administered 2024-02-29: 40 mg via INTRA_ARTICULAR

## 2024-03-09 ENCOUNTER — Emergency Department (EMERGENCY_DEPARTMENT_HOSPITAL)

## 2024-03-09 ENCOUNTER — Emergency Department (HOSPITAL_COMMUNITY)

## 2024-03-09 ENCOUNTER — Other Ambulatory Visit: Payer: Self-pay

## 2024-03-09 ENCOUNTER — Emergency Department (HOSPITAL_COMMUNITY)
Admission: EM | Admit: 2024-03-09 | Discharge: 2024-03-11 | Disposition: A | Discharge status: Home / Self Care | Attending: Emergency Medicine | Admitting: Emergency Medicine

## 2024-03-09 ENCOUNTER — Encounter (HOSPITAL_COMMUNITY): Payer: Self-pay

## 2024-03-09 ENCOUNTER — Other Ambulatory Visit: Payer: Self-pay | Admitting: Nurse Practitioner

## 2024-03-09 DIAGNOSIS — T50905A Adverse effect of unspecified drugs, medicaments and biological substances, initial encounter: Secondary | ICD-10-CM | POA: Diagnosis not present

## 2024-03-09 DIAGNOSIS — M25571 Pain in right ankle and joints of right foot: Secondary | ICD-10-CM | POA: Insufficient documentation

## 2024-03-09 DIAGNOSIS — H159 Unspecified disorder of sclera: Secondary | ICD-10-CM | POA: Insufficient documentation

## 2024-03-09 DIAGNOSIS — R079 Chest pain, unspecified: Secondary | ICD-10-CM | POA: Insufficient documentation

## 2024-03-09 DIAGNOSIS — F1090 Alcohol use, unspecified, uncomplicated: Secondary | ICD-10-CM

## 2024-03-09 DIAGNOSIS — K7460 Unspecified cirrhosis of liver: Secondary | ICD-10-CM | POA: Diagnosis not present

## 2024-03-09 DIAGNOSIS — F19982 Other psychoactive substance use, unspecified with psychoactive substance-induced sleep disorder: Secondary | ICD-10-CM | POA: Diagnosis not present

## 2024-03-09 DIAGNOSIS — K7031 Alcoholic cirrhosis of liver with ascites: Secondary | ICD-10-CM | POA: Diagnosis not present

## 2024-03-09 DIAGNOSIS — J90 Pleural effusion, not elsewhere classified: Secondary | ICD-10-CM | POA: Diagnosis not present

## 2024-03-09 DIAGNOSIS — F4381 Prolonged grief disorder: Secondary | ICD-10-CM | POA: Diagnosis not present

## 2024-03-09 DIAGNOSIS — R000 Tachycardia, unspecified: Secondary | ICD-10-CM | POA: Diagnosis not present

## 2024-03-09 DIAGNOSIS — R600 Localized edema: Secondary | ICD-10-CM | POA: Insufficient documentation

## 2024-03-09 DIAGNOSIS — K7030 Alcoholic cirrhosis of liver without ascites: Secondary | ICD-10-CM

## 2024-03-09 DIAGNOSIS — R7989 Other specified abnormal findings of blood chemistry: Secondary | ICD-10-CM

## 2024-03-09 DIAGNOSIS — M7989 Other specified soft tissue disorders: Secondary | ICD-10-CM | POA: Diagnosis not present

## 2024-03-09 DIAGNOSIS — F411 Generalized anxiety disorder: Secondary | ICD-10-CM | POA: Diagnosis not present

## 2024-03-09 DIAGNOSIS — R0602 Shortness of breath: Secondary | ICD-10-CM | POA: Diagnosis not present

## 2024-03-09 DIAGNOSIS — R918 Other nonspecific abnormal finding of lung field: Secondary | ICD-10-CM | POA: Diagnosis not present

## 2024-03-09 DIAGNOSIS — R161 Splenomegaly, not elsewhere classified: Secondary | ICD-10-CM | POA: Diagnosis not present

## 2024-03-09 DIAGNOSIS — M25471 Effusion, right ankle: Secondary | ICD-10-CM

## 2024-03-09 DIAGNOSIS — R188 Other ascites: Secondary | ICD-10-CM | POA: Diagnosis not present

## 2024-03-09 LAB — CBC WITH DIFFERENTIAL/PLATELET
Abs Immature Granulocytes: 0.19 K/uL — ABNORMAL HIGH (ref 0.00–0.07)
Basophils Absolute: 0.1 K/uL (ref 0.0–0.1)
Basophils Relative: 0 %
Eosinophils Absolute: 0.9 K/uL — ABNORMAL HIGH (ref 0.0–0.5)
Eosinophils Relative: 6 %
HCT: 41.8 % (ref 39.0–52.0)
Hemoglobin: 13.9 g/dL (ref 13.0–17.0)
Immature Granulocytes: 1 %
Lymphocytes Relative: 9 %
Lymphs Abs: 1.2 K/uL (ref 0.7–4.0)
MCH: 31.9 pg (ref 26.0–34.0)
MCHC: 33.3 g/dL (ref 30.0–36.0)
MCV: 95.9 fL (ref 80.0–100.0)
Monocytes Absolute: 1.5 K/uL — ABNORMAL HIGH (ref 0.1–1.0)
Monocytes Relative: 11 %
Neutro Abs: 9.6 K/uL — ABNORMAL HIGH (ref 1.7–7.7)
Neutrophils Relative %: 73 %
Platelets: 155 K/uL (ref 150–400)
RBC: 4.36 MIL/uL (ref 4.22–5.81)
RDW: 15.9 % — ABNORMAL HIGH (ref 11.5–15.5)
WBC: 13.4 K/uL — ABNORMAL HIGH (ref 4.0–10.5)
nRBC: 0 % (ref 0.0–0.2)

## 2024-03-09 LAB — RESP PANEL BY RT-PCR (RSV, FLU A&B, COVID)  RVPGX2
Influenza A by PCR: NEGATIVE
Influenza B by PCR: NEGATIVE
Resp Syncytial Virus by PCR: NEGATIVE
SARS Coronavirus 2 by RT PCR: NEGATIVE

## 2024-03-09 LAB — COMPREHENSIVE METABOLIC PANEL WITH GFR
ALT: 128 U/L — ABNORMAL HIGH (ref 0–44)
AST: 120 U/L — ABNORMAL HIGH (ref 15–41)
Albumin: 2.2 g/dL — ABNORMAL LOW (ref 3.5–5.0)
Alkaline Phosphatase: 210 U/L — ABNORMAL HIGH (ref 38–126)
Anion gap: 8 (ref 5–15)
BUN: 24 mg/dL — ABNORMAL HIGH (ref 6–20)
CO2: 26 mmol/L (ref 22–32)
Calcium: 8.5 mg/dL — ABNORMAL LOW (ref 8.9–10.3)
Chloride: 95 mmol/L — ABNORMAL LOW (ref 98–111)
Creatinine, Ser: 0.85 mg/dL (ref 0.61–1.24)
GFR, Estimated: >60 mL/min (ref >60)
Glucose, Bld: 102 mg/dL — ABNORMAL HIGH (ref 70–99)
Potassium: 4.4 mmol/L (ref 3.5–5.1)
Sodium: 129 mmol/L — ABNORMAL LOW (ref 135–145)
Total Bilirubin: 6.5 mg/dL — ABNORMAL HIGH (ref 0.0–1.2)
Total Protein: 7 g/dL (ref 6.5–8.1)

## 2024-03-09 LAB — BRAIN NATRIURETIC PEPTIDE: B Natriuretic Peptide: 30.6 pg/mL (ref 0.0–100.0)

## 2024-03-09 LAB — PROTIME-INR
INR: 1.5 — ABNORMAL HIGH (ref 0.8–1.2)
Prothrombin Time: 19.3 s — ABNORMAL HIGH (ref 11.4–15.2)

## 2024-03-09 MED ORDER — METHYLPREDNISOLONE 4 MG PO TABS: 8.0000 mg | ORAL_TABLET | Freq: Every day | ORAL | Status: DC

## 2024-03-09 MED ORDER — FAMOTIDINE IN NACL 20-0.9 MG/50ML-% IV SOLN
20.0000 mg | Freq: Once | INTRAVENOUS | Status: AC
  Administered 2024-03-09: 20 mg via INTRAVENOUS
  Filled 2024-03-09: qty 50

## 2024-03-09 MED ORDER — PANTOPRAZOLE SODIUM 40 MG PO TBEC
40.0000 mg | DELAYED_RELEASE_TABLET | Freq: Two times a day (BID) | ORAL | Status: DC
  Administered 2024-03-09 – 2024-03-11 (×4): 40 mg via ORAL
  Filled 2024-03-09 (×4): qty 1

## 2024-03-09 MED ORDER — FUROSEMIDE 20 MG PO TABS
40.0000 mg | ORAL_TABLET | Freq: Every day | ORAL | Status: DC
Start: 2024-03-09 — End: 2024-03-09

## 2024-03-09 MED ORDER — METHYLPREDNISOLONE 16 MG PO TABS: 16.0000 mg | ORAL_TABLET | Freq: Every day | ORAL | Status: DC

## 2024-03-09 MED ORDER — OXYCODONE HCL 5 MG PO TABS
5.0000 mg | ORAL_TABLET | Freq: Three times a day (TID) | ORAL | Status: DC | PRN
  Administered 2024-03-10 (×2): 5 mg via ORAL
  Filled 2024-03-09 (×2): qty 1

## 2024-03-09 MED ORDER — METHYLPREDNISOLONE 16 MG PO TABS
16.0000 mg | ORAL_TABLET | Freq: Every day | ORAL | Status: DC
  Administered 2024-03-10 – 2024-03-11 (×2): 16 mg via ORAL
  Filled 2024-03-09 (×3): qty 1

## 2024-03-09 MED ORDER — SPIRONOLACTONE 25 MG PO TABS
25.0000 mg | ORAL_TABLET | Freq: Every day | ORAL | Status: DC
  Administered 2024-03-09 – 2024-03-11 (×3): 25 mg via ORAL
  Filled 2024-03-09 (×2): qty 1
  Filled 2024-03-09: qty 2

## 2024-03-09 MED ORDER — LIDOCAINE VISCOUS HCL 2 % MT SOLN
15.0000 mL | Freq: Once | OROMUCOSAL | Status: AC
  Administered 2024-03-09: 15 mL via ORAL
  Filled 2024-03-09: qty 15

## 2024-03-09 MED ORDER — HYDROMORPHONE HCL 1 MG/ML IJ SOLN
0.5000 mg | Freq: Once | INTRAMUSCULAR | Status: AC
  Administered 2024-03-09: 0.5 mg via INTRAVENOUS
  Filled 2024-03-09: qty 1

## 2024-03-09 MED ORDER — ALUM & MAG HYDROXIDE-SIMETH 200-200-20 MG/5ML PO SUSP
30.0000 mL | Freq: Once | ORAL | Status: AC
Start: 2024-03-09 — End: 2024-03-09
  Administered 2024-03-09: 30 mL via ORAL
  Filled 2024-03-09: qty 30

## 2024-03-09 MED ORDER — METHYLPREDNISOLONE SODIUM SUCC 40 MG IJ SOLR
40.0000 mg | Freq: Once | INTRAMUSCULAR | Status: AC
Start: 2024-03-09 — End: 2024-03-09
  Administered 2024-03-09: 40 mg via INTRAVENOUS
  Filled 2024-03-09: qty 1

## 2024-03-09 MED ORDER — FUROSEMIDE 20 MG PO TABS
40.0000 mg | ORAL_TABLET | Freq: Every day | ORAL | Status: DC
  Administered 2024-03-10 – 2024-03-11 (×2): 40 mg via ORAL
  Filled 2024-03-09 (×2): qty 2

## 2024-03-09 MED ORDER — FUROSEMIDE 10 MG/ML IJ SOLN
40.0000 mg | Freq: Once | INTRAMUSCULAR | Status: AC
  Administered 2024-03-09: 40 mg via INTRAVENOUS
  Filled 2024-03-09: qty 4

## 2024-03-09 MED ORDER — IOHEXOL 350 MG/ML SOLN
100.0000 mL | Freq: Once | INTRAVENOUS | Status: AC | PRN
  Administered 2024-03-09: 100 mL via INTRAVENOUS

## 2024-03-09 NOTE — ED Provider Notes (Signed)
 [place] EMERGENCY DEPARTMENT AT [facility] Provider Note   CSN: [id number] Arrival date & time: 03/09/24  1303     Patient presents with: Shortness of Breath and Foot Swelling   Thomas [name] is a 48 y.o. male.    Shortness of Breath Associated symptoms: abdominal pain and chest pain   Patient presents for multiple complaints.  Medical history includes alcohol abuse, OSA, esophageal varices, cirrhosis, anemia.  He was admitted 2 weeks ago for decompensated cirrhosis with right-sided pleural effusion s/p thoracentesis on 9/13; right ankle pain and swelling with concern of psoriatic etiology.  His thoracentesis yielded transudative fluid.  He has history of right ankle surgery.  He was seen by orthopedic surgery while in the hospital.  They recommended IV Solu-Medrol  and prednisone  taper.  Patient reports that he has had a recent worsening of swelling and pain to his right ankle.  He stopped taking his steroids 2 days ago because he felt like they were causing him shaky.  He has been putting only very minimal weight on it.  He has been elevating it when he can.  He was taking oxycodone  for pain but discontinued this because he did not feel like it was doing anything for him.  He has been taking Aleve as well.  He has not symptoms of reflux described as epigastric burning pain.  He has had recent shortness of breath.      Prior to Admission medications   Medication Sig Start Date End Date Taking? Authorizing Provider  acetaminophen  (TYLENOL ) 325 MG tablet Take 1-2 tablets (325-650 mg total) by mouth every 4 (four) hours as needed for mild pain. 01/08/23   Love, Sharlet GORMAN, PA-C  clobetasol  cream (TEMOVATE ) 0.05 % Apply topically 2 (two) times daily. Apply to Psoriatic plaques on the extremities, trunk or scalp. DO not apply to face, intertriginous areas (armpits, groin, under pannus, gluteal cleft), or genitals. 02/24/24   Tobie Yetta HERO, MD  diclofenac  Sodium (VOLTAREN ) 1 % GEL  Apply 4 g topically 4 (four) times daily as needed (knee pain). 02/24/24   Tobie Yetta HERO, MD  furosemide  (LASIX ) 40 MG tablet Take 1 tablet (40 mg total) by mouth daily. 02/24/24   Patel, Pranav M, MD  loteprednol (LOTEMAX) 0.5 % ophthalmic suspension 1 DROP IN EACH EYE 4 TIMES DAILY FOR 2 WEEKS THEN TWICE DAILY FOR 4 WEEKS 02/01/24   [provider]  methylPREDNISolone  (MEDROL ) 8 MG tablet Take 3 tablets (24 mg total) by mouth daily for 7 days, THEN 2 tablets (16 mg total) daily for 7 days, THEN 1 tablet (8 mg total) daily for 14 days. 02/29/24 03/28/24  Jeannetta Lonni ORN, MD  nadolol  (CORGARD ) 20 MG tablet Take 1 tablet (20 mg total) by mouth daily. 03/11/23 03/10/24  Beather Delon Gibson, PA  oxyCODONE  (OXY IR/ROXICODONE ) 5 MG immediate release tablet Take 1 tablet (5 mg total) by mouth every 8 (eight) hours as needed for severe pain (pain score 7-10). 02/24/24   Tobie Yetta HERO, MD  pantoprazole  (PROTONIX ) 40 MG tablet Take 1 tablet (40 mg total) by mouth 2 (two) times daily. 12/24/23   Berneta Elsie Sayre, MD  spironolactone  (ALDACTONE ) 25 MG tablet Take 1 tablet (25 mg total) by mouth daily. 02/25/24   Tobie Yetta HERO, MD    Allergies: Codeine    Review of Systems  Respiratory:  Positive for shortness of breath.   Cardiovascular:  Positive for chest pain.  Gastrointestinal:  Positive for abdominal pain.  Musculoskeletal:  Positive for arthralgias and joint swelling.  All other systems reviewed and are negative.   Updated Vital Signs BP (!) 143/98   Pulse 93   Temp 98.7 F (37.1 C) (Oral)   Resp 17   Ht 5' 5 (1.651 m)   Wt 119.7 kg   SpO2 93%   BMI 43.93 kg/m   Physical Exam Vitals and nursing note reviewed.  Constitutional:      General: He is not in acute distress.    Appearance: He is well-developed. He is not toxic-appearing or diaphoretic.  HENT:     Head: Normocephalic and atraumatic.     Mouth/Throat:     Mouth: Mucous membranes are moist.  Eyes:      General: Scleral icterus present.     Conjunctiva/sclera: Conjunctivae normal.  Cardiovascular:     Rate and Rhythm: Normal rate and regular rhythm.     Heart sounds: No murmur heard. Pulmonary:     Effort: Pulmonary effort is normal. No tachypnea or respiratory distress.     Breath sounds: Rales present. No decreased breath sounds, wheezing or rhonchi.  Chest:     Chest wall: No tenderness.  Abdominal:     Palpations: Abdomen is soft.     Tenderness: There is no abdominal tenderness.  Musculoskeletal:        General: No swelling.     Cervical back: Normal range of motion and neck supple.     Right lower leg: Tenderness present. Edema present.  Skin:    General: Skin is warm and dry.     Coloration: Skin is not cyanotic or pale.  Neurological:     General: No focal deficit present.     Mental Status: He is alert and oriented to person, place, and time.  Psychiatric:        Mood and Affect: Mood normal.        Behavior: Behavior normal.     (all labs ordered are listed, but only abnormal results are displayed) Labs Reviewed  CBC WITH DIFFERENTIAL/PLATELET - Abnormal; Notable for the following components:      Result Value   WBC 13.4 (*)    RDW 15.9 (*)    Neutro Abs 9.6 (*)    Monocytes Absolute 1.5 (*)    Eosinophils Absolute 0.9 (*)    Abs Immature Granulocytes 0.19 (*)    All other components within normal limits  COMPREHENSIVE METABOLIC PANEL WITH GFR - Abnormal; Notable for the following components:   Sodium 129 (*)    Chloride 95 (*)    Glucose, Bld 102 (*)    BUN 24 (*)    Calcium 8.5 (*)    Albumin  2.2 (*)    AST 120 (*)    ALT 128 (*)    Alkaline Phosphatase 210 (*)    Total Bilirubin 6.5 (*)    All other components within normal limits  PROTIME-INR - Abnormal; Notable for the following components:   Prothrombin Time 19.3 (*)    INR 1.5 (*)    All other components within normal limits  RESP PANEL BY RT-PCR (RSV, FLU A&B, COVID)  RVPGX2  BRAIN  NATRIURETIC PEPTIDE    EKG: EKG Interpretation Date/Time:  Wednesday March 09 2024 13:20:50 EDT Ventricular Rate:  102 PR Interval:  146 QRS Duration:  80 QT Interval:  354 QTC Calculation: 461 R Axis:   73  Text Interpretation: Sinus tachycardia Cannot rule out Anterior infarct , age undetermined Abnormal ECG Confirmed by  Melvenia Motto [phone]) on 03/09/2024 3:44:02 PM  Radiology: VAS US  LOWER EXTREMITY VENOUS (DVT) (7a-7p) Result Date: 03/09/2024  Lower Venous DVT Study [name]  Date of Exam:   03/09/2024 Medical Rec #: [id number]       Accession #:    [id number] Date of Birth: 1975-12-19        Patient Gender: M Patient Age:   53 years Exam Location:  [facility] Procedure:      VAS US  LOWER EXTREMITY VENOUS (DVT) Referring Phys: LEITA MURPHY --------------------------------------------------------------------------------  Indications: Swelling, Edema, and SOB.  Comparison Study: Previous study on 7.29.2024. Performing Technologist: Edilia Elden Appl  Examination Guidelines: A complete evaluation includes B-mode imaging, spectral Doppler, color Doppler, and power Doppler as needed of all accessible portions of each vessel. Bilateral testing is considered an integral part of a complete examination. Limited examinations for reoccurring indications may be performed as noted. The reflux portion of the exam is performed with the patient in reverse Trendelenburg.  +---------+---------------+---------+-----------+----------+--------------+ RIGHT    CompressibilityPhasicitySpontaneityPropertiesThrombus Aging +---------+---------------+---------+-----------+----------+--------------+ CFV      Full           Yes      Yes                                 +---------+---------------+---------+-----------+----------+--------------+ SFJ      Full           Yes      Yes                                  +---------+---------------+---------+-----------+----------+--------------+ FV Prox  Full                                                        +---------+---------------+---------+-----------+----------+--------------+ FV Mid   Full                                                        +---------+---------------+---------+-----------+----------+--------------+ FV DistalFull                                                        +---------+---------------+---------+-----------+----------+--------------+ PFV      Full                                                        +---------+---------------+---------+-----------+----------+--------------+ POP      Full           Yes      Yes                                 +---------+---------------+---------+-----------+----------+--------------+ PTV  Full                                                        +---------+---------------+---------+-----------+----------+--------------+ PERO     Full                                                        +---------+---------------+---------+-----------+----------+--------------+   +----+---------------+---------+-----------+----------+--------------+ LEFTCompressibilityPhasicitySpontaneityPropertiesThrombus Aging +----+---------------+---------+-----------+----------+--------------+ CFV Full           Yes      Yes                                 +----+---------------+---------+-----------+----------+--------------+ SFJ Full           Yes      Yes                                 +----+---------------+---------+-----------+----------+--------------+     Summary: RIGHT: - There is no evidence of deep vein thrombosis in the lower extremity.  - No cystic structure found in the popliteal fossa.  LEFT: - No evidence of common femoral vein obstruction.   *See table(s) above for measurements and observations. Electronically signed by Lonni Gaskins MD on  03/09/2024 at 6:27:36 PM.    Final    CT CHEST ABDOMEN PELVIS W CONTRAST Result Date: 03/09/2024 CLINICAL DATA:  Unintended weight loss, short of breath EXAM: CT CHEST, ABDOMEN, AND PELVIS WITH CONTRAST TECHNIQUE: Multidetector CT imaging of the chest, abdomen and pelvis was performed following the standard protocol during bolus administration of intravenous contrast. RADIATION DOSE REDUCTION: This exam was performed according to the departmental dose-optimization program which includes automated exposure control, adjustment of the mA and/or kV according to patient size and/or use of iterative reconstruction technique. CONTRAST:  OMNIPAQUE  IOHEXOL  350 MG/ML SOLN COMPARISON:  02/20/2024 FINDINGS: CT CHEST FINDINGS Cardiovascular: The heart is unremarkable without pericardial effusion. No evidence of thoracic aortic aneurysm or dissection. Mediastinum/Nodes: Thyroid  and trachea are unremarkable. Stable varices surrounding the distal thoracic esophagus. No pathologic mediastinal or hilar adenopathy. Lungs/Pleura: Large right pleural effusion with compressive atelectasis primarily in the right middle and right lower lobe, stable. The left chest is clear. No pneumothorax. Central airways are patent. Musculoskeletal: No acute or destructive bony abnormalities. Reconstructed images demonstrate no additional findings. CT ABDOMEN PELVIS FINDINGS Hepatobiliary: Stable hepatic cirrhosis without focal abnormality identified on this single phase exam. No biliary duct dilation. The gallbladder is decompressed without cholelithiasis or cholecystitis. Pancreas: Unremarkable. No pancreatic ductal dilatation or surrounding inflammatory changes. Spleen: The spleen is enlarged, measuring 14.2 by 16.1 x 8.5 cm, consistent with sequela of portal venous hypertension. No focal parenchymal abnormality. Adrenals/Urinary Tract: Adrenal glands are unremarkable. Kidneys are normal, without renal calculi, focal lesion, or hydronephrosis.  Bladder is unremarkable. Stomach/Bowel: No bowel obstruction or ileus. No bowel wall thickening or inflammatory change. Vascular/Lymphatic: Sequela of portal venous hypertension with recanalization of the umbilical vein as well as gastric, esophageal, and splenic varices. Numerous subcentimeter retroperitoneal lymph nodes are nonspecific. Right external iliac chain adenopathy measuring up to  16 mm in short axis. Reproductive: Prostate is unremarkable. Other: Small volume ascites greatest within the pelvis. No free intraperitoneal gas. No abdominal wall hernia. Musculoskeletal: No acute or destructive bony abnormalities. Reconstructed images demonstrate no additional findings. IMPRESSION: 1. Stable large right pleural effusion with significant right middle and right lower lobe compressive atelectasis. 2. Cirrhosis, with portal venous hypertension manifested by ascites, splenomegaly, and upper abdominal varices. 3. Nonspecific subcentimeter retroperitoneal lymph nodes and borderline enlarged right external iliac chain adenopathy. Electronically Signed   By: Ozell Daring M.D.   On: 03/09/2024 17:46   DG Ankle 2 Views Right Result Date: 03/09/2024 CLINICAL DATA:  Ankle pain, foot swelling EXAM: RIGHT ANKLE - 2 VIEW COMPARISON:  02/20/2024 FINDINGS: Frontal and lateral views of the right ankle are obtained. There is progressive diffuse soft tissue edema since prior study. No acute or destructive bony abnormalities. Stable orthopedic hardware without evidence of failure or loosening. IMPRESSION: 1. Progressive diffuse soft tissue swelling. 2. No acute or destructive bony abnormality. 3. Stable orthopedic hardware. Electronically Signed   By: Ozell Daring M.D.   On: 03/09/2024 17:25   DG Chest 2 View Result Date: 03/09/2024 CLINICAL DATA:  Shortness of breath.  Right foot swelling. EXAM: DG CHEST 2V COMPARISON:  February 22, 2024. FINDINGS: The heart size and mediastinal contours are within normal limits. Left  lung is clear. Small right pleural effusion is noted with adjacent atelectasis or infiltrate. The visualized skeletal structures are unremarkable. IMPRESSION: Small right pleural effusion with adjacent atelectasis or infiltrate. Electronically Signed   By: Lynwood Landy Raddle M.D.   On: 03/09/2024 14:50     Procedures   Medications Ordered in the ED  oxyCODONE  (Oxy IR/ROXICODONE ) immediate release tablet 5 mg (has no administration in time range)  pantoprazole  (PROTONIX ) EC tablet 40 mg (has no administration in time range)  spironolactone  (ALDACTONE ) tablet 25 mg (has no administration in time range)  furosemide  (LASIX ) tablet 40 mg (has no administration in time range)  methylPREDNISolone  (MEDROL ) tablet 16 mg (has no administration in time range)    Followed by  methylPREDNISolone  (MEDROL ) tablet 8 mg (has no administration in time range)  HYDROmorphone  (DILAUDID ) injection 0.5 mg (0.5 mg Intravenous Given 03/09/24 1713)  alum & mag hydroxide-simeth (MAALOX/MYLANTA) 200-200-20 MG/5ML suspension 30 mL (30 mLs Oral Given 03/09/24 1705)    And  lidocaine  (XYLOCAINE ) 2 % viscous mouth solution 15 mL (15 mLs Oral Given 03/09/24 1705)  famotidine (PEPCID) IVPB 20 mg premix (0 mg Intravenous Stopped 03/09/24 1833)  furosemide  (LASIX ) injection 40 mg (40 mg Intravenous Given 03/09/24 1711)  methylPREDNISolone  sodium succinate (SOLU-MEDROL ) 40 mg/mL injection 40 mg (40 mg Intravenous Given 03/09/24 1708)  iohexol  (OMNIPAQUE ) 350 MG/ML injection 100 mL (100 mLs Intravenous Contrast Given 03/09/24 1707)                                    Medical Decision Making Amount and/or Complexity of Data Reviewed Radiology: ordered.  Risk OTC drugs. Prescription drug management.   This patient presents to the ED for concern of multiple complaints, this involves an extensive number of treatment options, and is a complaint that carries with it a high risk of complications and morbidity.  The differential  diagnosis includes recurrence of pleural effusion, pulmonary edema, anxiety, pneumonia, anemia, acidosis   Co morbidities / Chronic conditions that complicate the patient evaluation  alcohol abuse, OSA, esophageal varices, cirrhosis, anemia  Additional history obtained:  Additional history obtained from EMR External records from outside source obtained and reviewed including N/A   Lab Tests:  I Ordered, and personally interpreted labs.  The pertinent results include: Slight worsening of bilirubin and transaminase elevation, baseline hypoalbuminemia, mild leukocytosis likely secondary to ongoing steroid use.   Imaging Studies ordered:  I ordered imaging studies including x-ray of chest and right ankle; CT of chest, abdomen, pelvis; RLE DVT study I independently visualized and interpreted imaging which showed no acute findings I agree with the radiologist interpretation   Cardiac Monitoring: / EKG:  The patient was maintained on a cardiac monitor.  I personally viewed and interpreted the cardiac monitored which showed an underlying rhythm of: Sinus rhythm   Problem List / ED Course / Critical interventions / Medication management  Patient presenting for multiple complaints.  He had a recent hospitalization for pleural effusion and right ankle pain and swelling.  He did undergo a right-sided thoracentesis 2 weeks ago.  He has had recurrence of shortness of breath.  X-ray today shows a small right pleural effusion with adjacent atelectasis.  On exam, his breathing is unlabored and he is able to speak in complete sentences.  No wheezing is appreciated on lung auscultation.  Patient also has had worsening pain and swelling of his right ankle.  He was instructed to continue his steroids but discontinued them 2 days ago due to shakiness.  He also discontinued his use of oxycodone  because did not feel like it was helping his pain.  On exam, ankle is swollen and tender.  There is no erythema  or warmth.  Dilaudid  was ordered for analgesia.  Patient's lab work today is notable for leukocytosis, likely secondary to steroid use.  His bilirubin has increased from lab work from 2 weeks ago.  His albumin  is chronically low which is likely contributing to his leg swelling due to poor oncotic pressure.  His MELD-Na score today is 24.  He also describes worsening reflux symptoms.  GI cocktail was ordered.  Imaging studies ordered.  Given what sounds like worsening after he stopped his home narcotics and steroids, patient was restarted on these.  Dose of IV Lasix  was given.  He had good urine output while in the ED.  His CT of chest abdomen and pelvis does not show any evidence of biliary obstruction.  His DVT study was negative.  On reassessment, patient has had good urine output.  His pain has improved.  Patient was informed that there were no new findings to necessitate a medical admission.  He states that he does not feel comfortable going home.  He feels that his mobility is poor and he is a fall risk.  He seems to have poor insight into his liver failure.  He states that he has not yet spoken to anybody about possible transplant.  He has been abstaining from alcohol for the past 2 months.  PT evaluation and TOC consult were ordered.  Patient also states that he has crippling anxiety.  Will consult CTS as well.  He denies any SI or HI.  No medications ordered.  Patient to board in the ED awaiting further consults. I ordered medication including Dilaudid  for analgesia, GI cocktail for GERD, Lasix  and Solu-Medrol  for ankle swelling Reevaluation of the patient after these medicines showed that the patient improved I have reviewed the patients home medicines and have made adjustments as needed   Social Determinants of Health:  Has access to outpatient care  Final diagnoses:  Alcoholic cirrhosis of liver with ascites (HCC)  Pain and swelling of right ankle    ED Discharge Orders     None           Melvenia Motto, MD 03/09/24 2023

## 2024-03-09 NOTE — ED Triage Notes (Signed)
 PT arrives via POV. PT reports he was was recently admitted on 9/13 for pleural effusion and Cirrhosis. PT states he is here because the swelling and pain in his right ankle has worsened despite taking diuretics. He reports some sob as well. PT is AxOx4.

## 2024-03-09 NOTE — Telephone Encounter (Signed)
 Submitted a Prior Authorization request to [facility] for COSENTYX SQ via CoverMyMeds. Will update once we receive a response.  Key: [id number]

## 2024-03-09 NOTE — Telephone Encounter (Signed)
 Spoke with patient   Since the wait time for their cancelled/rescheduled appt is several months from now the following actions will be taken:  they are due for ultrasound to screen for liver cancer that we are sending an order and they will be contacted [facility] to schedule an ultrasound. I have also placed an order for the patient to have updated labs testing.  These can be done at any [facility] facility.

## 2024-03-09 NOTE — BH Assessment (Signed)
 TTS Consult will be completed by IRIS. IRIS provider will notify team of assessment time and provider name. Thanks

## 2024-03-09 NOTE — ED Provider Triage Note (Signed)
 Emergency Medicine Provider Triage Evaluation Note  Thomas [name] , a 48 y.o. male  was evaluated in triage.  Pt complains of SHOB, right lower ext edema, not improving with 1 month of meds and med changes. Also acid reflux  Constipated, feels likely due to pain meds.  Hx of cirrhosis  Review of Systems  Positive: SHOB, reflux, right lower leg swelling Negative: Abdominal pain  Physical Exam  BP 127/67   Pulse 99   Temp 98.9 F (37.2 C) (Oral)   Resp 20   Ht 5' 5 (1.651 m)   Wt 119.7 kg   SpO2 93%   BMI 43.93 kg/m  Gen:   Awake, no distress   Resp:  Normal effort  MSK:   Right lower ext edema  Other:    Medical Decision Making  Medically screening exam initiated at 1:48 PM.  Appropriate orders placed.  Thomas [name] was informed that the remainder of the evaluation will be completed by another provider, this initial triage assessment does not replace that evaluation, and the importance of remaining in the ED until their evaluation is complete.     Thomas Leita LABOR, PA-C 03/09/24 1350

## 2024-03-09 NOTE — Telephone Encounter (Signed)
 I also spoke to the patient and provided the same information.

## 2024-03-10 ENCOUNTER — Inpatient Hospital Stay: Admitting: Family Medicine

## 2024-03-10 ENCOUNTER — Ambulatory Visit: Payer: Self-pay | Admitting: Pharmacist

## 2024-03-10 ENCOUNTER — Other Ambulatory Visit (HOSPITAL_COMMUNITY): Payer: Self-pay

## 2024-03-10 DIAGNOSIS — F411 Generalized anxiety disorder: Secondary | ICD-10-CM

## 2024-03-10 DIAGNOSIS — R079 Chest pain, unspecified: Secondary | ICD-10-CM | POA: Diagnosis not present

## 2024-03-10 DIAGNOSIS — F4381 Prolonged grief disorder: Secondary | ICD-10-CM | POA: Diagnosis not present

## 2024-03-10 DIAGNOSIS — T50905A Adverse effect of unspecified drugs, medicaments and biological substances, initial encounter: Secondary | ICD-10-CM

## 2024-03-10 DIAGNOSIS — M7989 Other specified soft tissue disorders: Secondary | ICD-10-CM | POA: Diagnosis not present

## 2024-03-10 DIAGNOSIS — F19982 Other psychoactive substance use, unspecified with psychoactive substance-induced sleep disorder: Secondary | ICD-10-CM

## 2024-03-10 DIAGNOSIS — K7031 Alcoholic cirrhosis of liver with ascites: Secondary | ICD-10-CM | POA: Diagnosis not present

## 2024-03-10 MED ORDER — THIAMINE HCL 100 MG/ML IJ SOLN: 100.0000 mg | Freq: Every day | INTRAMUSCULAR | Status: DC

## 2024-03-10 MED ORDER — COSENTYX UNOREADY 300 MG/2ML ~~LOC~~ SOAJ: SUBCUTANEOUS | 0 refills | Status: DC

## 2024-03-10 MED ORDER — COSENTYX UNOREADY 300 MG/2ML ~~LOC~~ SOAJ: SUBCUTANEOUS | 1 refills | Status: DC

## 2024-03-10 MED ORDER — LORAZEPAM 1 MG PO TABS: 0.0000 mg | ORAL_TABLET | Freq: Four times a day (QID) | ORAL | Status: DC

## 2024-03-10 MED ORDER — LORAZEPAM 1 MG PO TABS: 0.0000 mg | ORAL_TABLET | Freq: Two times a day (BID) | ORAL | Status: DC

## 2024-03-10 MED ORDER — KETOROLAC TROMETHAMINE 15 MG/ML IJ SOLN
15.0000 mg | Freq: Once | INTRAMUSCULAR | Status: AC
  Administered 2024-03-10: 15 mg via INTRAVENOUS
  Filled 2024-03-10: qty 1

## 2024-03-10 MED ORDER — THIAMINE MONONITRATE 100 MG PO TABS
100.0000 mg | ORAL_TABLET | Freq: Every day | ORAL | Status: DC
  Administered 2024-03-10 – 2024-03-11 (×2): 100 mg via ORAL
  Filled 2024-03-10 (×2): qty 1

## 2024-03-10 MED ORDER — COSENTYX UNOREADY 300 MG/2ML ~~LOC~~ SOAJ: 300.0000 mg | SUBCUTANEOUS | 0 refills | Status: DC

## 2024-03-10 MED ORDER — LORAZEPAM 2 MG/ML IJ SOLN: 0.0000 mg | Freq: Four times a day (QID) | INTRAMUSCULAR | Status: DC

## 2024-03-10 MED ORDER — LORAZEPAM 2 MG/ML IJ SOLN: 0.0000 mg | Freq: Two times a day (BID) | INTRAMUSCULAR | Status: DC

## 2024-03-10 NOTE — Progress Notes (Addendum)
 TOC consult for SNF placement. Await PT eval for appropriate level of care recommendation.  1522: PT recommendation received for SNF per pt request. Met with pt and pt's father at bedside. Pt reports his commercial BCBS lapsed in September due to non-payment of COBRA premium. Pt has [facility] in place. Does not have disability. Will request SNF partners that accept [facility] review for possible SNF benefit without disability.   1612: Received potential offer from [facility] with [facility]. Discussed with pt and and he reports this is too far. No SNF options available in [place]. Pt plans to dc home and is requesting DME recommended by PT. CM updated.   Julien Das, MSW, LCSW [phone] (coverage)

## 2024-03-10 NOTE — ED Provider Notes (Addendum)
 Emergency Medicine Observation Re-evaluation Note  Thomas [name] is a 48 y.o. male, seen on rounds today.  Pt initially presented to the ED for complaints of Shortness of Breath and Foot Swelling Currently, the patient is possible placement.  Physical Exam  BP (!) 141/86   Pulse 93   Temp 98.7 F (37.1 C) (Oral)   Resp 14   Ht 5' 5 (1.651 m)   Wt 119.7 kg   SpO2 93%   BMI 43.93 kg/m  Physical Exam General: Resting   ED Course / MDM  EKG:EKG Interpretation Date/Time:  Wednesday March 09 2024 13:20:50 EDT Ventricular Rate:  102 PR Interval:  146 QRS Duration:  80 QT Interval:  354 QTC Calculation: 461 R Axis:   73  Text Interpretation: Sinus tachycardia Cannot rule out Anterior infarct , age undetermined Abnormal ECG Confirmed by Melvenia Motto [phone]) on 03/09/2024 3:44:02 PM  I have reviewed the labs performed to date as well as medications administered while in observation.  Recent changes in the last 24 hours include patient prefers to be discharged to home.  We have ordered DME equipment to his house.  Plan is for likely discharge later today.SABRA B[place] by psychiatry.  Plan  Patient had DME equipment sent to his house.  Working on discharge planning including home health home PT.   Ruthe Cornet, DO 03/10/24 1630    Ruthe Cornet, DO 03/10/24 1630    Ruthe Cornet, DO 03/10/24 [phone]

## 2024-03-10 NOTE — Care Management (Addendum)
 Transition of Care [name]) - Emergency Department Mini Assessment   Patient Details  Name: Thomas [name] MRN: [id number] Date of Birth: 1975/09/03  Transition of Care [name]) CM/SW Contact:    [name], RN Phone Number: 03/10/2024, 4:45 PM   Clinical Narrative: Patient presented to the ED for swelling and pain in right ankle, takes diuretics at home. Just discharged a few weeks ago from [name] hospital. He resides with his father. At time of discharge it looks like he was ordered a walker, added BSC messaged [name] from [name] to send to home. Inquiring with HH agencies to get [name] visits PT OT .Discussed with team in ED. Previous assessment today with PT stated he would benefit from SNF, but wants to go home. It was decided that he would stay for another PT session tomorrow at this time.   [name]:  [name]  ED Mini Assessment:     Awaiting acceptance for [facility] DME being sent to home.         Interventions which prevented an admission or readmission: Home Health Consult or Services, DME Provided    Patient Contact and Communications        ,                 Admission diagnosis:  swollen ankles; [facility] Patient Active Problem List   Diagnosis Date Noted   High risk medication use 02/29/2024   Pleural effusion associated with hepatic disorder 02/20/2024   Cellulitis of abdominal wall 03/10/2023   Esophageal varices without bleeding (HCC) 02/13/2023   History of ETOH abuse 01/09/2023   Ankle fracture 01/07/2023   Localized edema 01/07/2023   Closed right ankle fracture 01/06/2023   Thrombocytopenia 01/06/2023   Cirrhosis of liver (HCC) 12/25/2022   Secondary esophageal varices with bleeding (HCC) 12/25/2022   Reactive airway disease 07/28/2022   Acute upper GI bleed 07/05/2022   Hematemesis with nausea 07/05/2022   Melena 07/05/2022   Pharyngitis 10/22/2021   Psoriatic arthritis (HCC) 10/03/2021   Viral upper respiratory tract infection 10/03/2021   Acute blood  loss anemia 08/26/2021   Screening for tuberculosis 08/15/2021   Elevated BP without diagnosis of hypertension 06/14/2021   Need for immunization against influenza 06/14/2021   Esophageal varices in alcoholic cirrhosis (HCC) 09/26/2019   Class 3 severe obesity due to excess calories with body mass index (BMI) of 50.0 to 59.9 in adult [facility]) 09/26/2019   Healthcare maintenance 02/07/2019   Psoriasis 02/07/2019   Vitamin D  deficiency 05/07/2016   OSA (obstructive sleep apnea) 06/23/2013   Insomnia 06/16/2013   Alcohol use disorder 12/25/2011   Scrotal varices, 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   Alcoholic fatty liver 01/16/2010   Allergic rhinitis 10/12/2007   PCP:  Berneta Elsie Sayre, MD Pharmacy:   [facility] [address] [place] - [address] [place] [address] Phone: [phone] Fax: [phone]  [facility] [place] [place] [facility] [address] [place] [address] Phone: [phone] Fax: [phone]

## 2024-03-10 NOTE — ED Notes (Signed)
 Assuming pt care, pt walk in for right ankle swelling/pain x 1 month, pt reports side effects from prednisone  and pain meds prescribed by pcp. Concerned about going home alone and self care. Pt aaox4, uses walker at home, swelling to right ankle noted. Pt reports being comfortable while laying in bed, familky at bedside, call bell within reach.

## 2024-03-10 NOTE — Evaluation (Addendum)
 Physical Therapy Evaluation Patient Details Name: Thomas [name] MRN: [id number] DOB: [birth year] Today's Date: 03/10/2024  History of Present Illness  Pt is a 48 y/o male admitted 10/1 with SOB, worsening bil LE swelling and R ankle flare with significant pain.  Ankle pain has been his primary worry, but lately he has note more DOE.   PMHx: decompensated alcoholic cirrhosis with ascites, EV, splenomegaly, psoriasis, obesity, sleep apnea, psoriatic arthritis, portal hypertensive gastropathy, esophageal and gastric varices.  Clinical Impression  Pt admitted with/for SOB and recent intractable R ankle pain.  He is able to mobilize on a limited basis at a S to CGA level with RW or w/c, but has significant difficulty managing the stairs to the bed and full bath upstairs.  Pt currently limited functionally due to the problems listed. ( See problems list.)   Pt will benefit from PT to maximize function and safety in order to get ready for next venue listed below. Patient will benefit from continued inpatient follow up therapy, <3 hours/day for short term to recondition, strengthen while getting more intervention on his right painful ankle,  if not needs  DME as stated to manage in the home.           If plan is discharge home, recommend the following:  (pt does not need HHPT, but would like to see if he would qualify for [facility] SNF)   Can travel by private vehicle        Equipment Recommendations Rolling walker (2 wheels);BSC/3in1;Other (comment) (rental ramp to span 3-4 steps)  Recommendations for Other Services       Functional Status Assessment Patient has had a recent decline in their functional status and demonstrates the ability to make significant improvements in function in a reasonable and predictable amount of time.     Precautions / Restrictions Precautions Precautions: Fall Recall of Precautions/Restrictions: Intact Restrictions RLE Weight Bearing Per Provider Order: Weight bearing as  tolerated      Mobility  Bed Mobility Overal bed mobility: Modified Independent                  Transfers Overall transfer level: Needs assistance Equipment used: None Transfers: Sit to/from Stand, Bed to chair/wheelchair/BSC Sit to Stand: Supervision Stand pivot transfers: Contact guard assist         General transfer comment: cued for hand placement and overall safety/ safest technique.  Pt able to stand from various heights without assist, but laborious from lower surfaces.  Pt able to balance wit bil UE assist on 1 foot.    Ambulation/Gait Ambulation/Gait assistance: Supervision Gait Distance (Feet): 8 Feet (then 20 feet with swing to  patter and NWB on the R foot due to pain.) Assistive device: Rolling walker (2 wheels)   Gait velocity: decreased Gait velocity interpretation: <1.8 ft/sec, indicate of risk for recurrent falls   General Gait Details: pt unable to bear weight on the R foot, but can complete a safe swing to pattern with a RW  Stairs         General stair comments: We addressed the various methods on ingress/egress talked about last admission and added a stationary chair at the top of stairs whether inside or on the porch outside to aid the standing, resting and prep for continuing on the RW or w/c to bedroom/bath.  Wheelchair Mobility     Tilt Bed    Modified Rankin (Stroke Patients Only)       Balance Overall balance assessment: Needs  assistance Sitting-balance support: No upper extremity supported, Feet supported Sitting balance-Leahy Scale: Good     Standing balance support: No upper extremity supported, Bilateral upper extremity supported, During functional activity Standing balance-Leahy Scale: Fair (to reliant on the AD or external assist)                               Pertinent Vitals/Pain Pain Assessment Pain Assessment: 0-10 Faces Pain Scale: Hurts worst Pain Location: R ankle Pain Descriptors /  Indicators: Sharp, Grimacing, Guarding Pain Intervention(s): Limited activity within patient's tolerance, Monitored during session, Patient requesting pain meds-RN notified    Home Living Family/patient expects to be discharged to:: Private residence Living Arrangements: Parent Available Help at Discharge: Family;Available 24 hours/day Type of Home: House Home Access: Stairs to enter Entrance Stairs-Rails: Right Entrance Stairs-Number of Steps: 3 Alternate Level Stairs-Number of Steps: Flight Home Layout: Two level;Bed/bath upstairs;1/2 bath on main level;Other (Comment) (has been sleeping on the couch) Home Equipment: Cane - single point;Tub bench      Prior Function Prior Level of Function : Independent/Modified Independent;Driving             Mobility Comments: Indep no AD. Denies falls in the past 49mo. ADLs Comments: Indep with ADL/IADLs. Pt utilizes tub bench. Enjoys dancing. Not working since May, laid off. Drives     Extremity/Trunk Assessment   Upper Extremity Assessment Upper Extremity Assessment: Overall WFL for tasks assessed    Lower Extremity Assessment Lower Extremity Assessment: Overall WFL for tasks assessed;Generalized weakness;RLE deficits/detail RLE Deficits / Details: AROM limited at ankle, mild proximal weaknesses    Cervical / Trunk Assessment Cervical / Trunk Exceptions: Body Habitus  Communication   Communication Communication: No apparent difficulties    Cognition Arousal: Alert Behavior During Therapy: WFL for tasks assessed/performed   PT - Cognitive impairments: No apparent impairments                       PT - Cognition Comments: Pt A,Ox4 Following commands: Intact       Cueing Cueing Techniques: Verbal cues     General Comments General comments (skin integrity, edema, etc.): VSS on RA, mild dyspnea    Exercises     Assessment/Plan    PT Assessment Patient needs continued PT services  PT Problem List Decreased  strength;Decreased range of motion;Decreased activity tolerance;Decreased balance;Decreased mobility;Pain       PT Treatment Interventions DME instruction;Gait training;Stair training;Functional mobility training;Therapeutic activities;Therapeutic exercise;Balance training;Patient/family education    PT Goals (Current goals can be found in the Care Plan section)  Acute Rehab PT Goals Patient Stated Goal: be able to stay in the hospital another day or 2  OR SNF for a week until my pain and anxiety are manageable. PT Goal Formulation: With patient/family Time For Goal Achievement: 03/17/24 Potential to Achieve Goals: Good    Frequency Min 2X/week     Co-evaluation               AM-PAC PT 6 Clicks Mobility  Outcome Measure Help needed turning from your back to your side while in a flat bed without using bedrails?: None Help needed moving from lying on your back to sitting on the side of a flat bed without using bedrails?: None Help needed moving to and from a bed to a chair (including a wheelchair)?: A Little Help needed standing up from a chair using your arms (e.g., wheelchair or  bedside chair)?: A Little Help needed to walk in hospital room?: A Little Help needed climbing 3-5 steps with a railing? : A Little 6 Click Score: 20    End of Session   Activity Tolerance: Patient tolerated treatment well;Patient limited by fatigue (and NWB due to R ankle pain) Patient left: in bed;with call bell/phone within reach;with family/visitor present Nurse Communication: Mobility status PT Visit Diagnosis: Difficulty in walking, not elsewhere classified (R26.2);Pain Pain - Right/Left: Right Pain - part of body: Ankle and joints of foot    Time: 1046-1150 PT Time Calculation (min) (ACUTE ONLY): 64 min   Charges:   PT Evaluation $PT Eval Moderate Complexity: 1 Mod PT Treatments $Gait Training: 8-22 mins $Therapeutic Activity: 8-22 mins $Self Care/Home Management: 8-22 PT  General Charges $$ ACUTE PT VISIT: 1 Visit         03/10/2024  India HERO., PT Acute Rehabilitation Services [phone]  (office)03/10/2024  India HERO., PT Acute Rehabilitation Services [phone]  (office)  Vinie GAILS Aarav Burgett 03/10/2024, 1:48 PM

## 2024-03-10 NOTE — ED Notes (Addendum)
Pt speaking to TTS 

## 2024-03-10 NOTE — Consult Note (Signed)
 Thomas [name] Consult Note  Patient Name: Thomas [name] MRN: [id number] DOB: 1976-02-12 DATE OF Consult: 03/10/2024  PRIMARY PSYCHIATRIC DIAGNOSES  1.  Medication reaction 2.  GAD 3.  grief  RECOMMENDATIONS  Recommendations: Medication recommendations: Wellbutrin SR 100 mg 1 po daily for anxiety and depression, Seroquel 25 mg 1/2 - 1 tablet at bedtime for anxiety/sleep/mood Non-Medication/therapeutic recommendations: Resources for therapy Is inpatient psychiatric hospitalization recommended for this patient? No (Explain why): patient is not suicidal and does not meet criteria From a psychiatric perspective, is this patient appropriate for discharge to an outpatient setting/resource or other less restrictive environment for continued care?  Yes (Explain why): Patient needs outpatient level of ongoing care Follow-Up [name] C/L services: We will sign off for now. Please re-consult our service if needed for any concerning changes in the patient's condition, discharge planning, or questions. Communication: Treatment team members (and family members if applicable) who were involved in treatment/care discussions and planning, and with whom we spoke or engaged with via secure text/chat, include the following: Epic Chat with treatment team  Thank you for involving us  in the care of this patient. If you have any additional questions or concerns, please call [phone] and ask for me or the provider on-call.  [name] ATTESTATION & CONSENT  As the provider for this telehealth consult, I attest that I verified the patient's identity using two separate identifiers, introduced myself to the patient, provided my credentials, disclosed my location, and performed this encounter via a HIPAA-compliant, real-time, face-to-face, two-way, interactive audio and video platform and with the full consent and agreement of the patient (or guardian as applicable.)  Patient physical location: [name]. Telehealth provider physical location: home office in state of [place] .  Video start time: 2305 (Central Time) Video end time: 0055 (Central Time)  IDENTIFYING DATA  Thomas [name] is a 48 y.o. year-old male for whom a psychiatric consultation has been ordered by the primary provider. The patient was identified using two separate identifiers.  CHIEF COMPLAINT/REASON FOR CONSULT  Anxiety  HISTORY OF PRESENT ILLNESS (HPI)  The patient is being seen in the ED for medical reasons. He has a history of psoriatic arthritis. He is having a bad flare in his ankle that has hardware in it from a bad break. He was being treated with steroids which were making him tremulous and so the steroids were stopped.  Unbeknownst to him until we talked because of the steroids he was not sleeping at all and was having racing thoughts and severe anxiety.  He did not know that these could be side effects from the steroids.  He was also given narcotic pain medicine which was not helping the ankle pain at all and so he started taking Aleve.  This did help the pain more but he'll the sudden started swelling again and both legs were significantly swollen with increased pain and he presented to the ED.  Patient was not aware that the NSAIDs can cause fluid retention.  The patient also has a fatty liver.  He has had problems with alcohol in the past and he doesn't drink consistently but has had bouts of a lot of drinking.  His chart has labeled it as an alcoholic fatty liver disease but of no his father who does not drink alcohol at all has NASH, his sister and his mother both have fatty livers and neither one of them are drinkers.  There are other people within the extended family who also have liver  problems.  Strongly suspect that there is a organic reason for his liver dysfunction that was completely exacerbated by his drinking.  The patient was educated on how having a compromised liver affects every medicine you take.  He had  been on Wellbutrin XL 300 mg in the past and he stated that it made him feel completely numb and ultimately nonfunctioning.  He stopped taking it about a year and 1/2 ago.  He was educated on how long-acting the XL is and then when you have a compromised liver a cannot get metabolized efficiently and it will just build up in your system creating all kinds of side effects.  He liked it at first and tolerated it so we will suggest trying Wellbutrin SR and let it build up some.  The patient shares that since being on the steroids he is not been sleeping and he had racing thoughts and increasing anxiety.  We discussed a trial of a very small dose of Seroquel to see if it will slow down his brain help the sleep and help anxiety and he was willing to give that a try. The patient has a lot of anxiety around being the person at home that takes care of everybody else.  He states that this was his mother's job but she died four years ago and his sister and his father both fell apart and so he stepped up and knees been trying to do everything that mom did and take care of his dad and take care of his sister and tries to remember to take care of himself. He states that he is still trying to grieve the loss of his mother. He does all the finances for the family, etc.  His father is very sick with liver disease and he fell recently and has lots of back pain and the patient's been trying to take care of him.  With his ankle the way it is and feeling very unsteady on his feet when he's using a cane or walker he is terrified that he will fall and nobody will be able to help him.  And then if he falls and gets injured he is not going to be able to help anybody else. The patient is having significant anxiety and circular thoughts that seem to have been greatly exacerbated by the use of steroids.  He needs the steroids to treat his ankle pain but he's going to need medications to treat the side effects from the steroids.  With his  history of alcohol use I would refrain from using benzodiazepines if at all possible.  The patient was emotionally feeling better after talking once he realized how everything is connected with the medicines he was taking the symptoms he is having an his underlying diseases.  His rheumatologist has ordered a subcu medication for the psoriatic arthritis and it's in the process of being authorized by [employer].  Once he can get on the biologic he is likely going to have much less need for other medications that create all the side effects.  PAST PSYCHIATRIC HISTORY  History of anxiety No history of psychiatric hospitalizations No history of suicide attempts Otherwise as per HPI above.  PAST MEDICAL HISTORY  Past Medical History:  Diagnosis Date   Acute conjunctivitis of both eyes 10/22/2021   Acute sinusitis 06/16/2013   Alcoholic cirrhosis (HCC)    Alcoholic fatty liver 01/16/2010   Needs final HBV and HAV vaccines on or after 10/25/2012    Alcoholism (  HCC) 12/25/2011   Allergic rhinitis    Childhood asthma    Elevated transaminase level 06/10/2007   AST: 80 ALT: 136 in 8/11: Hepatitis A., B and C negative.    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    Sleep apnea      HOME MEDICATIONS  Facility Ordered Medications  Medication   [COMPLETED] HYDROmorphone  (DILAUDID ) injection 0.5 mg   [COMPLETED] alum & mag hydroxide-simeth (MAALOX/MYLANTA) 200-200-20 MG/5ML suspension 30 mL   And   [COMPLETED] lidocaine  (XYLOCAINE ) 2 % viscous mouth solution 15 mL   [COMPLETED] famotidine (PEPCID) IVPB 20 mg premix   [COMPLETED] furosemide  (LASIX ) injection 40 mg   [COMPLETED] methylPREDNISolone  sodium succinate (SOLU-MEDROL ) 40 mg/mL injection 40 mg   [COMPLETED] iohexol  (OMNIPAQUE ) 350 MG/ML  injection 100 mL   oxyCODONE  (Oxy IR/ROXICODONE ) immediate release tablet 5 mg   pantoprazole  (PROTONIX ) EC tablet 40 mg   spironolactone  (ALDACTONE ) tablet 25 mg   furosemide  (LASIX ) tablet 40 mg   methylPREDNISolone  (MEDROL ) tablet 16 mg   Followed by   NOREEN ON 03/15/2024] methylPREDNISolone  (MEDROL ) tablet 8 mg   PTA Medications  Medication Sig   acetaminophen  (TYLENOL ) 325 MG tablet Take 1-2 tablets (325-650 mg total) by mouth every 4 (four) hours as needed for mild pain.   nadolol  (CORGARD ) 20 MG tablet Take 1 tablet (20 mg total) by mouth daily.   pantoprazole  (PROTONIX ) 40 MG tablet Take 1 tablet (40 mg total) by mouth 2 (two) times daily.   loteprednol (LOTEMAX) 0.5 % ophthalmic suspension 1 DROP IN EACH EYE 4 TIMES DAILY FOR 2 WEEKS THEN TWICE DAILY FOR 4 WEEKS   clobetasol  cream (TEMOVATE ) 0.05 % Apply topically 2 (two) times daily. Apply to Psoriatic plaques on the extremities, trunk or scalp. DO not apply to face, intertriginous areas (armpits, groin, under pannus, gluteal cleft), or genitals.   diclofenac  Sodium (VOLTAREN ) 1 % GEL Apply 4 g topically 4 (four) times daily as needed (knee pain).   furosemide  (LASIX ) 40 MG tablet Take 1 tablet (40 mg total) by mouth daily.   oxyCODONE  (OXY IR/ROXICODONE ) 5 MG immediate release tablet Take 1 tablet (5 mg total) by mouth every 8 (eight) hours as needed for severe pain (pain score 7-10).   spironolactone  (ALDACTONE ) 25 MG tablet Take 1 tablet (25 mg total) by mouth daily.   methylPREDNISolone  (MEDROL ) 8 MG tablet Take 3 tablets (24 mg total) by mouth daily for 7 days, THEN 2 tablets (16 mg total) daily for 7 days, THEN 1 tablet (8 mg total) daily for 14 days.     ALLERGIES  Allergies  Allergen Reactions   Codeine Other (See Comments)    Jittery     SOCIAL & SUBSTANCE USE HISTORY  Social History   Socioeconomic History   Marital status: Single    Spouse name: Not on file   Number of children: 0   Years of education: Not on  file   Highest education level: 12th grade  Occupational History   Not on file  Tobacco Use   Smoking status: Some Days    Types: Cigars    Passive exposure: Past   Smokeless tobacco: Never   Tobacco comments:    Cigars occasional  Vaping Use   Vaping status: Never Used  Substance and Sexual Activity  Alcohol use: Not Currently    Comment: Sober since November 2024   Drug use: Never    Comment: history of cocaine abuse   Sexual activity: Yes  Other Topics Concern   Not on file  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             Social Drivers of Corporate investment banker Strain: Low Risk  (11/26/2023)   Overall Financial Resource Strain (CARDIA)    Difficulty of Paying Living Expenses: Not hard at all  Food Insecurity: No Food Insecurity (02/21/2024)   Hunger Vital Sign    Worried About Running Out of Food in the Last Year: Never true    Ran Out of Food in the Last Year: Never true  Transportation Needs: No Transportation Needs (02/21/2024)   PRAPARE - Administrator, Civil Service (Medical): No    Lack of Transportation (Non-Medical): No  Physical Activity: Insufficiently Active (11/26/2023)   Exercise Vital Sign    Days of Exercise per Week: 1 day    Minutes of Exercise per Session: 10 min  Stress: No Stress Concern Present (11/26/2023)   [employer] of Occupational Health - Occupational Stress Questionnaire    Feeling of Stress: Not at all  Social Connections: Socially Isolated (11/26/2023)   Social Connection and Isolation Panel    Frequency of Communication with Friends and Family: More than three times a week    Frequency of Social Gatherings with Friends and Family: More than three times a week    Attends Religious Services: Patient declined    Database administrator or Organizations: No    Attends Engineer, structural: Not on file    Marital Status: Never married   Social History    Tobacco Use  Smoking Status Some Days   Types: Cigars   Passive exposure: Past  Smokeless Tobacco Never  Tobacco Comments   Cigars occasional   Social History   Substance and Sexual Activity  Alcohol Use Not Currently   Comment: Sober since November 2024   Social History   Substance and Sexual Activity  Drug Use Never   Comment: history of cocaine abuse    Additional pertinent information .  FAMILY HISTORY  Family History  Problem Relation Age of Onset   Liver disease Mother    Depression Mother    Liver disease Father    Diabetes Father    Hypertension Father    Liver disease Sister    Diabetes Other    Colon cancer Neg Hx    Esophageal cancer Neg Hx    Stomach cancer Neg Hx    Colon polyps Neg Hx    Family Psychiatric History (if known):  mother had some depression  MENTAL STATUS EXAM (MSE)  Mental Status Exam: General Appearance: Fairly Groomed  Orientation:  Full (Time, Place, and Person)  Memory:  Recent;   Good Remote;   Good  Concentration:  Concentration: Fair  Recall:  NA  Attention  Good  Eye Contact:  Good  Speech:  Clear and Coherent and Normal Rate  Language:  Good  Volume:  Normal  Mood: anxious and overwhelmed  Affect:  Appropriate and Congruent  Thought Process:  Goal Directed and Linear  Thought Content:  Logical  Suicidal Thoughts:  No  Homicidal Thoughts:  No  Judgement:  Fair  Insight:  Fair  Psychomotor Activity:  Normal  Akathisia:  patient is not experiencing  acute akathisia at this moment but it does sound like he was experiencing some activation, tremors and increased anxiety secondary to the steroids  Fund of Knowledge:  Fair    Assets:  Manufacturing systems engineer Desire for Improvement Financial Resources/Insurance Housing  Cognition:  WNL  ADL's:  Intact  AIMS (if indicated):       VITALS  Blood pressure (!) 141/86, pulse 93, temperature 98.7 F (37.1 C), temperature source Oral, resp. rate 14, height 5' 5 (1.651 m),  weight 119.7 kg, SpO2 93%.  LABS  Admission on 03/09/2024  Component Date Value Ref Range Status   WBC 03/09/2024 13.4 (H)  4.0 - 10.5 K/uL Final   RBC 03/09/2024 4.36  4.22 - 5.81 MIL/uL Final   Hemoglobin 03/09/2024 13.9  13.0 - 17.0 g/dL Final   HCT 89/98/7974 41.8  39.0 - 52.0 % Final   MCV 03/09/2024 95.9  80.0 - 100.0 fL Final   MCH 03/09/2024 31.9  26.0 - 34.0 pg Final   MCHC 03/09/2024 33.3  30.0 - 36.0 g/dL Final   RDW 89/98/7974 15.9 (H)  11.5 - 15.5 % Final   Platelets 03/09/2024 155  150 - 400 K/uL Final   nRBC 03/09/2024 0.0  0.0 - 0.2 % Final   Neutrophils Relative % 03/09/2024 73  % Final   Neutro Abs 03/09/2024 9.6 (H)  1.7 - 7.7 K/uL Final   Lymphocytes Relative 03/09/2024 9  % Final   Lymphs Abs 03/09/2024 1.2  0.7 - 4.0 K/uL Final   Monocytes Relative 03/09/2024 11  % Final   Monocytes Absolute 03/09/2024 1.5 (H)  0.1 - 1.0 K/uL Final   Eosinophils Relative 03/09/2024 6  % Final   Eosinophils Absolute 03/09/2024 0.9 (H)  0.0 - 0.5 K/uL Final   Basophils Relative 03/09/2024 0  % Final   Basophils Absolute 03/09/2024 0.1  0.0 - 0.1 K/uL Final   Immature Granulocytes 03/09/2024 1  % Final   Abs Immature Granulocytes 03/09/2024 0.19 (H)  0.00 - 0.07 K/uL Final   Performed at [facility], [address], [place], [place] [address]   B Natriuretic Peptide 03/09/2024 30.6  0.0 - 100.0 pg/mL Final   Performed at [facility], [address], [place], [place] [address]   SARS Coronavirus 2 by RT PCR 03/09/2024 NEGATIVE  NEGATIVE Final   Influenza A by PCR 03/09/2024 NEGATIVE  NEGATIVE Final   Influenza B by PCR 03/09/2024 NEGATIVE  NEGATIVE Final   Comment: (NOTE) The Xpert Xpress SARS-CoV-2/FLU/RSV plus assay is intended as an aid in the diagnosis of influenza from Nasopharyngeal swab specimens and should not be used as a sole basis for treatment. Nasal washings and aspirates are unacceptable for Xpert Xpress SARS-CoV-2/FLU/RSV testing.  Fact Sheet for  Patients: BloggerCourse.com  Fact Sheet for Healthcare Providers: SeriousBroker.it  This test is not yet approved or cleared by the [place]  FDA and has been authorized for detection and/or diagnosis of SARS-CoV-2 by FDA under an Emergency Use Authorization (EUA). This EUA will remain in effect (meaning this test can be used) for the duration of the COVID-19 declaration under Section 564(b)(1) of the Act, 21 U.S.C. section 360bbb-3(b)(1), unless the authorization is terminated or revoked.     Resp Syncytial Virus by PCR 03/09/2024 NEGATIVE  NEGATIVE Final   Comment: (NOTE) Fact Sheet for Patients: BloggerCourse.com  Fact Sheet for Healthcare Providers: SeriousBroker.it  This test is not yet approved or cleared by the [place]  FDA and has been  authorized for detection and/or diagnosis of SARS-CoV-2 by FDA under an Emergency Use Authorization (EUA). This EUA will remain in effect (meaning this test can be used) for the duration of the COVID-19 declaration under Section 564(b)(1) of the Act, 21 U.S.C. section 360bbb-3(b)(1), unless the authorization is terminated or revoked.  Performed at [facility], [address] [place], [place] [address]    Sodium 03/09/2024 129 (L)  135 - 145 mmol/L Final   Potassium 03/09/2024 4.4  3.5 - 5.1 mmol/L Final   Chloride 03/09/2024 95 (L)  98 - 111 mmol/L Final   CO2 03/09/2024 26  22 - 32 mmol/L Final   Glucose, Bld 03/09/2024 102 (H)  70 - 99 mg/dL Final   Glucose reference range applies only to samples taken after fasting for at least 8 hours.   BUN 03/09/2024 24 (H)  6 - 20 mg/dL Final   Creatinine, Ser 03/09/2024 0.85  0.61 - 1.24 mg/dL Final   Calcium 89/98/7974 8.5 (L)  8.9 - 10.3 mg/dL Final   Total Protein 89/98/7974 7.0  6.5 - 8.1 g/dL Final   Albumin  03/09/2024 2.2 (L)  3.5 - 5.0 g/dL Final   AST 89/98/7974 120 (H)   15 - 41 U/L Final   ALT 03/09/2024 128 (H)  0 - 44 U/L Final   Alkaline Phosphatase 03/09/2024 210 (H)  38 - 126 U/L Final   Total Bilirubin 03/09/2024 6.5 (H)  0.0 - 1.2 mg/dL Final   GFR, Estimated 03/09/2024 >60  >60 mL/min Final   Comment: (NOTE) Calculated using the CKD-EPI Creatinine Equation (2021)    Anion gap 03/09/2024 8  5 - 15 Final   Performed at [facility], [address] [place], [place] [address]   Prothrombin Time 03/09/2024 19.3 (H)  11.4 - 15.2 seconds Final   INR 03/09/2024 1.5 (H)  0.8 - 1.2 Final   Comment: (NOTE) INR goal varies based on device and disease states. Performed at [facility], [address] [place], [place] [address]     PSYCHIATRIC REVIEW OF SYSTEMS (ROS)  ROS: Notable for the following relevant positive findings: ROS  Additional findings:      Musculoskeletal: Impaired      Gait & Station: Laying/Sitting      Pain Screening: Present - mild to moderate      Nutrition & Dental Concerns: no concerned  RISK FORMULATION/ASSESSMENT  Is the patient experiencing any suicidal or homicidal ideations: No        Protective factors considered for safety management: responsibility to his family, he is not suicidal, is future oriented  Risk factors/concerns considered for safety management:  Physical illness/chronic pain Hopelessness Male gender Unmarried  Is there a safety management plan with the patient and treatment team to minimize risk factors and promote protective factors: Yes           Explain: patient is being monitored in the ED Is crisis care placement or psychiatric hospitalization recommended: No     Based on my current evaluation and risk assessment, patient is determined at this time to be at:  Low risk  *RISK ASSESSMENT Risk assessment is a dynamic process; it is possible that this patient's condition, and risk level, may change. This should be re-evaluated and managed over time as appropriate. Please re-consult  psychiatric consult services if additional assistance is needed in terms of risk assessment and management. If your team decides to discharge this patient, please advise the patient how  to best access emergency psychiatric services, or to call 911, if their condition worsens or they feel unsafe in any way.   Eliany Mccarter A Asuncion Shibata, NP [name]

## 2024-03-10 NOTE — Care Management (Signed)
    Durable Medical Equipment  (From admission, onward)           Start     Ordered   03/10/24 1632  For home use only DME Walker rolling  Once       Question Answer Comment  Walker: With 5 Inch Wheels   Patient needs a walker to treat with the following condition Weakness      03/10/24 1631   03/10/24 1632  For home use only DME Bedside commode  Once       Question:  Patient needs a bedside commode to treat with the following condition  Answer:  Weakness   03/10/24 1631

## 2024-03-10 NOTE — Telephone Encounter (Addendum)
 Received notification from [facility] regarding a prior authorization for COSENTYX SQ. Authorization has been APPROVED from 03/10/2024 to 03/10/2025. Approval letter sent to scan center.  Per test claim, copay for 28 days supply is $0 (Medicaid is secondary)  Patient must fill through [facility]: [phone]. He can have one fill through [facility] if he needs to start in office.  Authorization # [id number]  D/w Dr. Jeannetta - TB gold resulted as indeterminate on 02/29/2024 likely secondary to steroid use. Patient is still on methylprednisolone  (avoiding prednisone  due to cirrhotic history). Patient can start treatment with Cosentyx and we will repeat TB gold. Patient is currently in the ED - secure chat message sent to Dr. Melvenia to request TB gold be drawn. If unable to draw while in ED, we will plan to repeat at f/u appt on 05/03/24  MyChart message sent to patient with next steps.  Sherry Pennant, PharmD, MPH, BCPS, CPP Clinical Pharmacist [facility])

## 2024-03-11 DIAGNOSIS — K7031 Alcoholic cirrhosis of liver with ascites: Secondary | ICD-10-CM | POA: Diagnosis not present

## 2024-03-11 DIAGNOSIS — M7989 Other specified soft tissue disorders: Secondary | ICD-10-CM | POA: Diagnosis not present

## 2024-03-11 DIAGNOSIS — R2681 Unsteadiness on feet: Secondary | ICD-10-CM | POA: Diagnosis not present

## 2024-03-11 DIAGNOSIS — H159 Unspecified disorder of sclera: Secondary | ICD-10-CM | POA: Diagnosis not present

## 2024-03-11 DIAGNOSIS — R600 Localized edema: Secondary | ICD-10-CM | POA: Diagnosis not present

## 2024-03-11 DIAGNOSIS — M25571 Pain in right ankle and joints of right foot: Secondary | ICD-10-CM | POA: Diagnosis not present

## 2024-03-11 MED ORDER — ALBUTEROL SULFATE HFA 108 (90 BASE) MCG/ACT IN AERS: 1.0000 | INHALATION_SPRAY | Freq: Four times a day (QID) | RESPIRATORY_TRACT | 1 refills | Status: AC | PRN

## 2024-03-11 NOTE — Progress Notes (Signed)
 Physical Therapy Treatment Patient Details Name: Thomas [name] MRN: [id number] DOB: 1976-01-06 Today's Date: 03/11/2024   History of Present Illness Pt is a 48 y/o male admitted 10/1 with SOB, worsening bil LE swelling and R ankle flare with significant pain.  Ankle pain has been his primary worry, but lately he has note more DOE.   PMHx: decompensated alcoholic cirrhosis with ascites, EV, splenomegaly, psoriasis, obesity, sleep apnea, psoriatic arthritis, portal hypertensive gastropathy, esophageal and gastric varices.    PT Comments  Pt tolerates treatment well, with much improved tolerance for weightbearing through RLE. Pt is able to ambulate for household distances with support of a RW. PT provides resources for ramp rental or permanent installation at the pt's request. Outpatient PT services would be beneficial for further strengthening of pt's R ankle due to chronic weakness from prior fracture.    If plan is discharge home, recommend the following: A little help with bathing/dressing/bathroom;Assistance with cooking/housework;Assist for transportation;Help with stairs or ramp for entrance   Can travel by private vehicle        Equipment Recommendations  Other (comment) (bariatric RW and BSC)    Recommendations for Other Services       Precautions / Restrictions Precautions Precautions: Fall Recall of Precautions/Restrictions: Intact Restrictions Weight Bearing Restrictions Per Provider Order: No     Mobility  Bed Mobility Overal bed mobility: Modified Independent                  Transfers Overall transfer level: Modified independent Equipment used: Rolling walker (2 wheels) Transfers: Sit to/from Stand Sit to Stand: Modified independent (Device/Increase time), From elevated surface                Ambulation/Gait Ambulation/Gait assistance: Modified independent (Device/Increase time) Gait Distance (Feet): 100 Feet Assistive device: Rolling walker (2  wheels) Gait Pattern/deviations: Step-through pattern Gait velocity: reduced Gait velocity interpretation: <1.8 ft/sec, indicate of risk for recurrent falls   General Gait Details: slowed step-through gait, reduced stance time on RLE   Stairs         General stair comments: pt reports having knowledge of multiple stair negotiation techniques from prior ankle fx, including scooting up/down steps on buttocks, ascending/descending with cane and railing. With pt's ability to bear weight through RLE this morning ascending/descending with railing and use of cane appears to be a viable option for stair negotiation at this time. PT provides resources for ramp installation/rental at pt's request   Wheelchair Mobility     Tilt Bed    Modified Rankin (Stroke Patients Only)       Balance Overall balance assessment: Needs assistance Sitting-balance support: No upper extremity supported, Feet supported Sitting balance-Leahy Scale: Good     Standing balance support: No upper extremity supported, During functional activity Standing balance-Leahy Scale: Fair                              Hotel manager: No apparent difficulties  Cognition Arousal: Alert Behavior During Therapy: WFL for tasks assessed/performed   PT - Cognitive impairments: No apparent impairments                         Following commands: Intact      Cueing Cueing Techniques: Verbal cues  Exercises      General Comments General comments (skin integrity, edema, etc.): VSS on RA      Pertinent Vitals/Pain Pain  Assessment Pain Assessment: 0-10 Pain Score: 5  Pain Location: R ankle Pain Descriptors / Indicators: Sore Pain Intervention(s): Monitored during session    Home Living                          Prior Function            PT Goals (current goals can now be found in the care plan section) Acute Rehab PT Goals Patient Stated Goal: to go  home Progress towards PT goals: Progressing toward goals    Frequency    Min 2X/week      PT Plan      Co-evaluation              AM-PAC PT 6 Clicks Mobility   Outcome Measure  Help needed turning from your back to your side while in a flat bed without using bedrails?: None Help needed moving from lying on your back to sitting on the side of a flat bed without using bedrails?: None Help needed moving to and from a bed to a chair (including a wheelchair)?: None Help needed standing up from a chair using your arms (e.g., wheelchair or bedside chair)?: None Help needed to walk in hospital room?: None Help needed climbing 3-5 steps with a railing? : A Little 6 Click Score: 23    End of Session   Activity Tolerance: Patient tolerated treatment well Patient left: in bed;with call bell/phone within reach;with family/visitor present Nurse Communication: Mobility status PT Visit Diagnosis: Difficulty in walking, not elsewhere classified (R26.2);Pain Pain - Right/Left: Right Pain - part of body: Ankle and joints of foot     Time: 0824-0850 PT Time Calculation (min) (ACUTE ONLY): 26 min  Charges:    $Gait Training: 8-22 mins PT General Charges $$ ACUTE PT VISIT: 1 Visit                     Bernardino JINNY Ruth, PT, DPT Acute Rehabilitation Office [phone]    Bernardino JINNY Ruth 03/11/2024, 9:02 AM

## 2024-03-11 NOTE — Discharge Instructions (Addendum)
 Pain, I think it is appropriate for you to take very small dose of oxycodone .  You can take 2.5 mg instead of the 5 mg pill.  You can take this every 6 hours as needed for pain.  Please continue follow-up with your liver doctors.  I did call in albuterol  inhaler.   Please follow-up with Dr. Jeannetta.  Please call to establish care.  He is a rheumatologist.   [facility] will contact you to schedule home therapy* [address], [address] [place], [place], [address] [phone]

## 2024-03-11 NOTE — ED Notes (Signed)
 Report given to [place] RN.

## 2024-03-11 NOTE — ED Notes (Signed)
AVS with prescriptions provided to and discussed with patient and family member at bedside. Pt verbalizes understanding of discharge instructions and denies any questions or concerns at this time. Pt ambulated out of department independently with steady gait.  

## 2024-03-11 NOTE — TOC Transition Note (Signed)
 Transition of Care [facility]) - Discharge Note  Patient Details  Name: Thomas [name] MRN: [id number] Date of Birth: 1976-06-06  Transition of Care [facility]) CM/SW Contact:  Nena LITTIE Coffee, RN Phone Number: 03/11/2024, 11:03 AM  Clinical Narrative:     Pt c/hx of pleural effusion associated c/hepatic disorder, severe obesity, psoriatic arthritis, recent right ankle fracture presented to the ED c/complaint of increasing pain and swelling to right ankle and shortness of breath.   Pt to dc home c/[name], OT, a bariatric walker and bariatric 3n1 from [employer]. Previous order for standard walker & BSC cancelled due to size and order had not yet been processed, spoke c/[name] at [facility] by phone to verify cancellation. [employer] to deliver equipment to the bedside.   Final next level of care: Home w Home Health Services Barriers to Discharge: Barriers Resolved  Discharge Plan and Services Additional resources added to the After Visit Summary for          DME Arranged: Walker rolling, Bedside commode (Bariatric 3n1, bariatric RW ([employer])) DME Agency: [employer] Date DME Agency Contacted: 03/11/24 Time DME Agency Contacted: 1654 Representative spoke with at DME Agency: Darlyn HH Arranged: PT, OT HH Agency: [facility] Date [facility] Agency Contacted: 03/10/24   Social Drivers of Health (SDOH) Interventions SDOH Screenings   Food Insecurity: No Food Insecurity (02/21/2024)  Housing: Low Risk  (02/21/2024)  Transportation Needs: No Transportation Needs (02/21/2024)  Utilities: Not At Risk (02/21/2024)  Depression (PHQ2-9): Low Risk  (07/28/2022)  Financial Resource Strain: Low Risk  (11/26/2023)  Physical Activity: Insufficiently Active (11/26/2023)  Social Connections: Socially Isolated (11/26/2023)  Stress: No Stress Concern Present (11/26/2023)  Tobacco Use: High Risk (03/09/2024)   Readmission Risk Interventions     No data to display

## 2024-03-11 NOTE — ED Provider Notes (Signed)
   Evaluated the patient.  He is doing well.  Hemodynamically stable.  Asking to go home.  Physical therapy saw the patient.  Was able to walk him with a walker.  Subsequently provided him with a home use walker as well as 3 and 1 toilet.  Recommended Ortho follow-up for his right ankle pain.  Tolerating p.o.  He can follow-up outpatient otherwise.  No indication for admission at this time.  On room air.  This chart was marked with a positive suicide risk assessment.  He denies all SI and HI.   No active drinking.  Clear for outpatient.    Simon Lavonia SAILOR, MD 03/11/24 1310

## 2024-03-11 NOTE — ED Provider Notes (Signed)
 Emergency Medicine Observation Re-evaluation Note  Thomas [name] is a 48 y.o. male, seen on rounds today.  Pt initially presented to the ED for complaints of Shortness of Breath and Foot Swelling Currently, the patient is sleeping.  Physical Exam  BP 139/87   Pulse 92   Temp 98.8 F (37.1 C) (Oral)   Resp 18   Ht 5' 5 (1.651 m)   Wt 119.7 kg   SpO2 95%   BMI 43.93 kg/m  Physical Exam General: No acute distress Cardiac: Regular Lungs: No respiratory distress Psych: Calm and cooperative Persistent swelling in the feet and ankles  ED Course / MDM  EKG:EKG Interpretation Date/Time:  Wednesday March 09 2024 13:20:50 EDT Ventricular Rate:  102 PR Interval:  146 QRS Duration:  80 QT Interval:  354 QTC Calculation: 461 R Axis:   73  Text Interpretation: Sinus tachycardia Cannot rule out Anterior infarct , age undetermined Abnormal ECG Confirmed by Melvenia Motto [phone]) on 03/09/2024 3:44:02 PM  I have reviewed the labs performed to date as well as medications administered while in observation.  Recent changes in the last 24 hours include evaluated by PT yesterday and recommended skilled facility however patient would like to go home.  Plan  Current plan is for having another PT session today for further decisions about rehab versus home with home health services.    Doretha Folks, MD 03/11/24 [phone]

## 2024-03-13 ENCOUNTER — Other Ambulatory Visit: Payer: Self-pay

## 2024-03-13 ENCOUNTER — Emergency Department (HOSPITAL_COMMUNITY)
Admission: EM | Admit: 2024-03-13 | Discharge: 2024-03-13 | Disposition: A | Discharge status: Home / Self Care | Attending: Emergency Medicine | Admitting: Emergency Medicine

## 2024-03-13 ENCOUNTER — Emergency Department (HOSPITAL_COMMUNITY)

## 2024-03-13 ENCOUNTER — Encounter (HOSPITAL_COMMUNITY): Payer: Self-pay | Admitting: *Deleted

## 2024-03-13 DIAGNOSIS — E7220 Disorder of urea cycle metabolism, unspecified: Secondary | ICD-10-CM | POA: Diagnosis not present

## 2024-03-13 DIAGNOSIS — X58XXXA Exposure to other specified factors, initial encounter: Secondary | ICD-10-CM | POA: Diagnosis not present

## 2024-03-13 DIAGNOSIS — S91001A Unspecified open wound, right ankle, initial encounter: Secondary | ICD-10-CM | POA: Diagnosis not present

## 2024-03-13 DIAGNOSIS — M25571 Pain in right ankle and joints of right foot: Secondary | ICD-10-CM | POA: Diagnosis not present

## 2024-03-13 DIAGNOSIS — M25471 Effusion, right ankle: Secondary | ICD-10-CM | POA: Diagnosis not present

## 2024-03-13 LAB — CBC WITH DIFFERENTIAL/PLATELET
Abs Immature Granulocytes: 0.16 K/uL — ABNORMAL HIGH (ref 0.00–0.07)
Basophils Absolute: 0 K/uL (ref 0.0–0.1)
Basophils Relative: 0 %
Eosinophils Absolute: 0.1 K/uL (ref 0.0–0.5)
Eosinophils Relative: 1 %
HCT: 41.4 % (ref 39.0–52.0)
Hemoglobin: 13.4 g/dL (ref 13.0–17.0)
Immature Granulocytes: 1 %
Lymphocytes Relative: 7 %
Lymphs Abs: 1.1 K/uL (ref 0.7–4.0)
MCH: 31.7 pg (ref 26.0–34.0)
MCHC: 32.4 g/dL (ref 30.0–36.0)
MCV: 97.9 fL (ref 80.0–100.0)
Monocytes Absolute: 1.5 K/uL — ABNORMAL HIGH (ref 0.1–1.0)
Monocytes Relative: 9 %
Neutro Abs: 14.1 K/uL — ABNORMAL HIGH (ref 1.7–7.7)
Neutrophils Relative %: 82 %
Platelets: 136 K/uL — ABNORMAL LOW (ref 150–400)
RBC: 4.23 MIL/uL (ref 4.22–5.81)
RDW: 16.4 % — ABNORMAL HIGH (ref 11.5–15.5)
WBC: 17 K/uL — ABNORMAL HIGH (ref 4.0–10.5)
nRBC: 0 % (ref 0.0–0.2)

## 2024-03-13 LAB — URINE DRUG SCREEN
Amphetamines: NEGATIVE
Barbiturates: NEGATIVE
Benzodiazepines: NEGATIVE
Cocaine: NEGATIVE
Fentanyl: NEGATIVE
Methadone Scn, Ur: NEGATIVE
Opiates: NEGATIVE
Tetrahydrocannabinol: NEGATIVE

## 2024-03-13 LAB — URINALYSIS, W/ REFLEX TO CULTURE (INFECTION SUSPECTED)
Bacteria, UA: NONE SEEN
Bilirubin Urine: NEGATIVE
Glucose, UA: NEGATIVE mg/dL
Hgb urine dipstick: NEGATIVE
Ketones, ur: NEGATIVE mg/dL
Leukocytes,Ua: NEGATIVE
Nitrite: NEGATIVE
Protein, ur: NEGATIVE mg/dL
Specific Gravity, Urine: 1.01 (ref 1.005–1.030)
pH: 5 (ref 5.0–8.0)

## 2024-03-13 LAB — COMPREHENSIVE METABOLIC PANEL WITH GFR
ALT: 139 U/L — ABNORMAL HIGH (ref 0–44)
AST: 120 U/L — ABNORMAL HIGH (ref 15–41)
Albumin: 2.8 g/dL — ABNORMAL LOW (ref 3.5–5.0)
Alkaline Phosphatase: 236 U/L — ABNORMAL HIGH (ref 38–126)
Anion gap: 9 (ref 5–15)
BUN: 18 mg/dL (ref 6–20)
CO2: 28 mmol/L (ref 22–32)
Calcium: 9.2 mg/dL (ref 8.9–10.3)
Chloride: 95 mmol/L — ABNORMAL LOW (ref 98–111)
Creatinine, Ser: 0.74 mg/dL (ref 0.61–1.24)
GFR, Estimated: >60 mL/min (ref >60)
Glucose, Bld: 98 mg/dL (ref 70–99)
Potassium: 4.1 mmol/L (ref 3.5–5.1)
Sodium: 133 mmol/L — ABNORMAL LOW (ref 135–145)
Total Bilirubin: 5.3 mg/dL — ABNORMAL HIGH (ref 0.0–1.2)
Total Protein: 6.9 g/dL (ref 6.5–8.1)

## 2024-03-13 LAB — ETHANOL: Alcohol, Ethyl (B): <15 mg/dL (ref <15)

## 2024-03-13 LAB — AMMONIA: Ammonia: 78 umol/L — ABNORMAL HIGH (ref 9–35)

## 2024-03-13 MED ORDER — LACTULOSE 10 GM/15ML PO SOLN: 30.0000 g | Freq: Three times a day (TID) | ORAL | 0 refills | Status: DC

## 2024-03-13 NOTE — ED Provider Notes (Signed)
 [place] EMERGENCY DEPARTMENT AT [facility] Provider Note   CSN: [id number] Arrival date & time: 03/13/24  1243     Patient presents with: ankle wound   Thomas [name] is a 48 y.o. male.   48 year old male presents with cirrhosis presents with drainage from his right ankle.  States he was stepping to shower # he noted blood coming from his right lateral ankle.  Denies any overt trauma.  Recently hospitalized for psychiatric illness.  Does have a history of alcoholic cirrhosis.  Denies any current alcohol use.  Spoke to his sister on the phone who states that he is seems somewhat confused and possibly more jaundiced.  Had blood work done few days ago which was reviewed and showed an increase in his transaminases.  Denies any other bleeding at this time.  Denies any bruising.  No history of head trauma       Prior to Admission medications   Medication Sig Start Date End Date Taking? Authorizing Provider  albuterol  (VENTOLIN  HFA) 108 (90 Base) MCG/ACT inhaler Inhale 1-2 puffs into the lungs every 6 (six) hours as needed for wheezing or shortness of breath. 03/11/24   Simon Lavonia SAILOR, MD  clobetasol  cream (TEMOVATE ) 0.05 % Apply topically 2 (two) times daily. Apply to Psoriatic plaques on the extremities, trunk or scalp. DO not apply to face, intertriginous areas (armpits, groin, under pannus, gluteal cleft), or genitals. 02/24/24   Tobie Yetta HERO, MD  diclofenac  Sodium (VOLTAREN ) 1 % GEL Apply 4 g topically 4 (four) times daily as needed (knee pain). 02/24/24   Tobie Yetta HERO, MD  furosemide  (LASIX ) 40 MG tablet Take 1 tablet (40 mg total) by mouth daily. 02/24/24   Patel, Pranav M, MD  loteprednol (LOTEMAX) 0.5 % ophthalmic suspension Place 1 drop into both eyes 4 (four) times daily. 02/01/24   [provider]  methylPREDNISolone  (MEDROL ) 8 MG tablet Take 3 tablets (24 mg total) by mouth daily for 7 days, THEN 2 tablets (16 mg total) daily for 7 days, THEN 1 tablet (8 mg  total) daily for 14 days. 02/29/24 03/28/24  Jeannetta Lonni ORN, MD  nadolol  (CORGARD ) 20 MG tablet Take 1 tablet (20 mg total) by mouth daily. 03/11/23 03/10/24  Beather Delon Gibson, PA  oxyCODONE  (OXY IR/ROXICODONE ) 5 MG immediate release tablet Take 1 tablet (5 mg total) by mouth every 8 (eight) hours as needed for severe pain (pain score 7-10). 02/24/24   Tobie Yetta HERO, MD  pantoprazole  (PROTONIX ) 40 MG tablet Take 1 tablet (40 mg total) by mouth 2 (two) times daily. 12/24/23   Berneta Elsie Sayre, MD  Secukinumab (COSENTYX UNOREADY) 300 MG/2ML SOAJ Inject 300mg  into the skin at [facility] 0, 1, 2, 3 Patient not taking: Reported on 03/10/2024 03/10/24   Jeannetta Lonni ORN, MD  spironolactone  (ALDACTONE ) 25 MG tablet Take 1 tablet (25 mg total) by mouth daily. 02/25/24   Tobie Yetta HERO, MD    Allergies: Codeine    Review of Systems  All other systems reviewed and are negative.   Updated Vital Signs BP (!) 176/107 (BP Location: Right Arm)   Pulse (!) 115   Temp 98.8 F (37.1 C) (Oral)   Resp 16   Ht 1.651 m (5' 5)   Wt 119.7 kg   SpO2 96%   BMI 43.93 kg/m   Physical Exam Vitals and nursing note reviewed.  Constitutional:      General: He is not in acute distress.    Appearance:  Normal appearance. He is well-developed. He is not toxic-appearing.  HENT:     Head: Normocephalic and atraumatic.  Eyes:     General: Lids are normal. Scleral icterus present.     Conjunctiva/sclera: Conjunctivae normal.     Pupils: Pupils are equal, round, and reactive to light.  Neck:     Thyroid : No thyroid  mass.     Trachea: No tracheal deviation.  Cardiovascular:     Rate and Rhythm: Normal rate and regular rhythm.     Heart sounds: Normal heart sounds. No murmur heard.    No gallop.  Pulmonary:     Effort: Pulmonary effort is normal. No respiratory distress.     Breath sounds: Normal breath sounds. No stridor. No decreased breath sounds, wheezing, rhonchi or rales.  Abdominal:      General: There is no distension.     Palpations: Abdomen is soft.     Tenderness: There is no abdominal tenderness. There is no rebound.  Musculoskeletal:        General: No tenderness. Normal range of motion.     Cervical back: Normal range of motion and neck supple.       Legs:  Skin:    General: Skin is warm and dry.     Findings: No abrasion or rash.  Neurological:     General: No focal deficit present.     Mental Status: He is alert and oriented to person, place, and time. Mental status is at baseline.     GCS: GCS eye subscore is 4. GCS verbal subscore is 5. GCS motor subscore is 6.     Cranial Nerves: No cranial nerve deficit.     Sensory: No sensory deficit.     Motor: Motor function is intact.     Comments: No asterixis  Psychiatric:        Attention and Perception: Attention normal.        Speech: Speech normal.        Behavior: Behavior normal.     (all labs ordered are listed, but only abnormal results are displayed) Labs Reviewed  COMPREHENSIVE METABOLIC PANEL WITH GFR  CBC WITH DIFFERENTIAL/PLATELET  AMMONIA  ETHANOL  URINALYSIS, W/ REFLEX TO CULTURE (INFECTION SUSPECTED)  URINE DRUG SCREEN    EKG: None  Radiology: No results found.   Procedures   Medications Ordered in the ED - No data to display                                  Medical Decision Making Amount and/or Complexity of Data Reviewed Labs: ordered. Radiology: ordered.   Patient's labs reviewed and his transaminases are somewhat stabilized.  His ammonia level is elevated here.  Urinalysis is negative.  Will prescribe lactulose.  Patient is not encephalopathic at this time.  X-ray of patient's right ankle shows no acute injury.  Low suspicion for septic joint.  He has had no fever.  Does have some skin breakdown.  Padding applied to the area.  Patient requesting a boot and will follow-up with his doctor next week     Final diagnoses:  None    ED Discharge Orders     None           Dasie Faden, MD 03/13/24 [phone]

## 2024-03-13 NOTE — ED Triage Notes (Signed)
 Pt had a swollen rt lateral ankle, this morning while taking a shower it started to bleed. He has had surgery in past on same ankle.

## 2024-03-13 NOTE — Discharge Instructions (Signed)
 Your ammonia level is elevated today.  Call your doctor tomorrow to schedule a follow-up visit.  Apply daily antibiotics such as Neosporin to your right ankle area.  Wear the boot as needed for comfort.  Return here for confusion, fever, bright red blood, or any other problems

## 2024-03-14 DIAGNOSIS — K7031 Alcoholic cirrhosis of liver with ascites: Secondary | ICD-10-CM | POA: Diagnosis not present

## 2024-03-14 DIAGNOSIS — D649 Anemia, unspecified: Secondary | ICD-10-CM | POA: Diagnosis not present

## 2024-03-14 DIAGNOSIS — I10 Essential (primary) hypertension: Secondary | ICD-10-CM | POA: Diagnosis not present

## 2024-03-14 DIAGNOSIS — S91001D Unspecified open wound, right ankle, subsequent encounter: Secondary | ICD-10-CM | POA: Diagnosis not present

## 2024-03-14 DIAGNOSIS — J9811 Atelectasis: Secondary | ICD-10-CM | POA: Diagnosis not present

## 2024-03-14 DIAGNOSIS — K766 Portal hypertension: Secondary | ICD-10-CM | POA: Diagnosis not present

## 2024-03-14 DIAGNOSIS — Z9181 History of falling: Secondary | ICD-10-CM | POA: Diagnosis not present

## 2024-03-14 DIAGNOSIS — G4733 Obstructive sleep apnea (adult) (pediatric): Secondary | ICD-10-CM | POA: Diagnosis not present

## 2024-03-14 DIAGNOSIS — M2550 Pain in unspecified joint: Secondary | ICD-10-CM | POA: Diagnosis not present

## 2024-03-14 DIAGNOSIS — J90 Pleural effusion, not elsewhere classified: Secondary | ICD-10-CM | POA: Diagnosis not present

## 2024-03-14 DIAGNOSIS — F1010 Alcohol abuse, uncomplicated: Secondary | ICD-10-CM | POA: Diagnosis not present

## 2024-03-14 DIAGNOSIS — Z7952 Long term (current) use of systemic steroids: Secondary | ICD-10-CM | POA: Diagnosis not present

## 2024-03-14 DIAGNOSIS — I8500 Esophageal varices without bleeding: Secondary | ICD-10-CM | POA: Diagnosis not present

## 2024-03-16 ENCOUNTER — Encounter: Payer: Self-pay | Admitting: Pharmacist

## 2024-03-17 ENCOUNTER — Telehealth: Payer: Self-pay

## 2024-03-17 ENCOUNTER — Telehealth: Payer: Self-pay | Admitting: Family Medicine

## 2024-03-17 NOTE — Telephone Encounter (Signed)
 Caller Name: [facility]   Call back phone #: [phone]  Reason for Call: He stated the patient has an infection in his ankle/foot and is oozing and he wants verbal orders to call in a nurse.

## 2024-03-17 NOTE — Telephone Encounter (Signed)
 Copied from CRM #[id number]. Topic: Clinical - Home Health Verbal Orders >> Mar 16, 2024  5:05 PM Charolett L wrote: Caller/Agency: [facility] Callback Number: [phone] Service Requested: Physical Therapy stated that the right lateral leg are swollen leaking fluids. Patient needs a nurse to come to the home because he can't walk   Contacted patient due to message, pt was in the Er on 10/05 for swelling and drainage from his right ankle. Patient is sch for Dr. Berneta on 10/06. Advised on returning back to ER if drainage persist

## 2024-03-18 DIAGNOSIS — F1010 Alcohol abuse, uncomplicated: Secondary | ICD-10-CM | POA: Diagnosis not present

## 2024-03-18 DIAGNOSIS — K7031 Alcoholic cirrhosis of liver with ascites: Secondary | ICD-10-CM | POA: Diagnosis not present

## 2024-03-18 DIAGNOSIS — K766 Portal hypertension: Secondary | ICD-10-CM | POA: Diagnosis not present

## 2024-03-18 DIAGNOSIS — G4733 Obstructive sleep apnea (adult) (pediatric): Secondary | ICD-10-CM | POA: Diagnosis not present

## 2024-03-18 DIAGNOSIS — J9811 Atelectasis: Secondary | ICD-10-CM | POA: Diagnosis not present

## 2024-03-18 DIAGNOSIS — S91001D Unspecified open wound, right ankle, subsequent encounter: Secondary | ICD-10-CM | POA: Diagnosis not present

## 2024-03-18 DIAGNOSIS — M2550 Pain in unspecified joint: Secondary | ICD-10-CM | POA: Diagnosis not present

## 2024-03-18 DIAGNOSIS — I10 Essential (primary) hypertension: Secondary | ICD-10-CM | POA: Diagnosis not present

## 2024-03-18 DIAGNOSIS — Z9181 History of falling: Secondary | ICD-10-CM | POA: Diagnosis not present

## 2024-03-18 DIAGNOSIS — D649 Anemia, unspecified: Secondary | ICD-10-CM | POA: Diagnosis not present

## 2024-03-18 DIAGNOSIS — Z7952 Long term (current) use of systemic steroids: Secondary | ICD-10-CM | POA: Diagnosis not present

## 2024-03-18 DIAGNOSIS — I8500 Esophageal varices without bleeding: Secondary | ICD-10-CM | POA: Diagnosis not present

## 2024-03-18 DIAGNOSIS — J90 Pleural effusion, not elsewhere classified: Secondary | ICD-10-CM | POA: Diagnosis not present

## 2024-03-18 NOTE — Patient Instructions (Incomplete)
 Your next COSENTYX SQ dose is due on ***, ***, and every *** days thereafter  {continuestartchange:25972}  HOLD {RHPULMMEDS:22219} if you have signs or symptoms of an infection. You can resume once you feel better or back to your baseline. HOLD {RHPULMMEDS:22219} if you start antibiotics to treat an infection. HOLD {RHPULMMEDS:22219} around the time of surgery/procedures. Your surgeon will be able to provide recommendations on when to hold BEFORE and when you are cleared to RESUME.  Pharmacy information: Your prescription will be shipped from ***. Their phone number is *** Please call to schedule shipment and confirm address. They will mail your medication to your home.  Cost information: Your copay should be affordable. If you call the pharmacy and it is not affordable, please double-check that they are billing through your copay card as secondary coverage. That copay card information is: ID: *** BIN: *** PCN: *** Group Number: ***  Labs are due in 1 month then every 3 months. Lab hours are from Monday to Thursday 8am-12:30pm and 1pm-4pm and Friday 8am-12pm. You do not need an appointment if you come for labs during these times. If you'd like to go to a [facility] or [facility] closer to home, please call our clinic 48 hours prior to lab date so we can release orders in a timely manner.  Stay up to date on all routine vaccines: influenza, pneumonia, COVID19, Shingles  How to manage an injection site reaction: Remember the 5 C's: COUNTER - leave on the counter at least 30 minutes but up to overnight to bring medication to room temperature. This may help prevent stinging COLD - place something cold (like an ice gel pack or cold water bottle) on the injection site just before cleansing with alcohol. This may help reduce pain CLARITIN - use Claritin (generic name is loratadine) for the first two weeks of treatment or the day of, the day before, and the day after injecting. This will help to  minimize injection site reactions CORTISONE CREAM - apply if injection site is irritated and itching CALL ME - if injection site reaction is bigger than the size of your fist, looks infected, blisters, or if you develop hives

## 2024-03-18 NOTE — Progress Notes (Deleted)
 Pharmacy Note  Subjective:   Patient presents to clinic today to receive first dose of COSENTYX SQ for psoriatic arthritis and psoriasis. Patient currently takes ***.  Patient running a fever or have signs/symptoms of infection? {yes/no:20286}  Patient currently on antibiotics for the treatment of infection? {yes/no:20286}  Patient have any upcoming invasive procedures/surgeries? {yes/no:20286}  Objective: CMP     Component Value Date/Time   NA 133 (L) 03/13/2024 1442   K 4.1 03/13/2024 1442   CL 95 (L) 03/13/2024 1442   CO2 28 03/13/2024 1442   GLUCOSE 98 03/13/2024 1442   BUN 18 03/13/2024 1442   CREATININE 0.74 03/13/2024 1442   CREATININE 0.66 06/14/2021 1419   CALCIUM 9.2 03/13/2024 1442   PROT 6.9 03/13/2024 1442   ALBUMIN  2.8 (L) 03/13/2024 1442   AST 120 (H) 03/13/2024 1442   ALT 139 (H) 03/13/2024 1442   ALKPHOS 236 (H) 03/13/2024 1442   BILITOT 5.3 (H) 03/13/2024 1442   GFRNONAA >60 03/13/2024 1442   GFRNONAA >89 06/22/2013 0958   GFRAA >89 06/22/2013 0958    CBC    Component Value Date/Time   WBC 17.0 (H) 03/13/2024 1442   RBC 4.23 03/13/2024 1442   HGB 13.4 03/13/2024 1442   HGB 12.6 (L) 02/05/2024 1230   HCT 41.4 03/13/2024 1442   HCT 36.5 (L) 02/05/2024 1230   PLT 136 (L) 03/13/2024 1442   PLT 111 (L) 02/05/2024 1230   MCV 97.9 03/13/2024 1442   MCV 92 02/05/2024 1230   MCH 31.7 03/13/2024 1442   MCHC 32.4 03/13/2024 1442   RDW 16.4 (H) 03/13/2024 1442   RDW 14.9 02/05/2024 1230   LYMPHSABS 1.1 03/13/2024 1442   LYMPHSABS 0.6 (L) 02/05/2024 1230   MONOABS 1.5 (H) 03/13/2024 1442   EOSABS 0.1 03/13/2024 1442   EOSABS 0.4 02/05/2024 1230   BASOSABS 0.0 03/13/2024 1442   BASOSABS 0.0 02/05/2024 1230    Baseline Immunosuppressant Therapy Labs TB GOLD    Latest Ref Rng & Units 02/29/2024    2:58 PM  Quantiferon TB Gold  Quantiferon TB Gold Plus NEGATIVE INDETERMINATE    Hepatitis Panel    Latest Ref Rng & Units 08/01/2021   10:26 PM   Hepatitis  Hep B Surface Ag NON REACTIVE NON REACTIVE   Hep B IgM NON REACTIVE NON REACTIVE   Hep C Ab NON REACTIVE NON REACTIVE   Hep A IgM NON REACTIVE NON REACTIVE    HIV Lab Results  Component Value Date   HIV Non Reactive 02/21/2024   HIV Non Reactive 07/05/2022   HIV NON REAC 01/17/2010   Immunoglobulins    Latest Ref Rng & Units 09/05/2021    3:19 PM  Immunoglobulin Electrophoresis  IgG 600 - 1,640 mg/dL 7,949    SPEP    Latest Ref Rng & Units 03/13/2024    2:42 PM  Serum Protein Electrophoresis  Total Protein 6.5 - 8.1 g/dL 6.9    H3EI No results found for: G6PDH TPMT No results found for: TPMT   Chest x-ray: ***  Assessment/Plan:  Reviewed importance of holding COSENTYX SQ with signs/symptoms of an infections, if antibiotics are prescribed to treat an active infection, and with invasive procedures  Demonstrated proper injection technique with Cosentyx Unoready demo device  Patient able to demonstrate proper injection technique using the teach back method.  Patient self injected in the {injsitedsg:28167} with:  Sample Medication: Cosentyx Unoready 300mg /52mL pen NDC: *** Lot: *** Expiration: ***  Patient tolerated well.  Observed for  30 mins in office for adverse reaction. {injectionreaction:30756}  Patient is to return in 1 month for labs and 6-8 weeks for follow-up appointment.  Standing orders for CBC/CMP placed.  TB gold will be monitored yearly. .  COSENTYX SQ approved through insurance .   Rx sent to: [facility]: [phone].  Patient provided with pharmacy phone number and advised to call later this week to schedule shipment to home.  Patient will continue COSENTYX SQ 300mg mg subcut at Weeks 0 (administered in office today), Week 1, 2, 3, 4 then every 4 weeks thereafter.  All questions encouraged and answered.  Instructed patient to call with any further questions or concerns.  Sherry Pennant, PharmD, MPH, BCPS, CPP Clinical  Pharmacist [facility])  03/18/2024 11:27 AM

## 2024-03-20 ENCOUNTER — Inpatient Hospital Stay (HOSPITAL_COMMUNITY)

## 2024-03-20 ENCOUNTER — Other Ambulatory Visit: Payer: Self-pay

## 2024-03-20 ENCOUNTER — Emergency Department (HOSPITAL_COMMUNITY)

## 2024-03-20 ENCOUNTER — Encounter (HOSPITAL_COMMUNITY): Payer: Self-pay

## 2024-03-20 ENCOUNTER — Inpatient Hospital Stay (HOSPITAL_COMMUNITY)
Admission: EM | Admit: 2024-03-20 | Discharge: 2024-03-30 | DRG: 493 | Disposition: A | Discharge status: Home Health | Attending: Internal Medicine | Admitting: Internal Medicine

## 2024-03-20 DIAGNOSIS — T84624A Infection and inflammatory reaction due to internal fixation device of right fibula, initial encounter: Principal | ICD-10-CM | POA: Diagnosis present

## 2024-03-20 DIAGNOSIS — D696 Thrombocytopenia, unspecified: Secondary | ICD-10-CM | POA: Diagnosis present

## 2024-03-20 DIAGNOSIS — M7989 Other specified soft tissue disorders: Secondary | ICD-10-CM

## 2024-03-20 DIAGNOSIS — J948 Other specified pleural conditions: Secondary | ICD-10-CM | POA: Diagnosis not present

## 2024-03-20 DIAGNOSIS — S91001A Unspecified open wound, right ankle, initial encounter: Secondary | ICD-10-CM | POA: Diagnosis not present

## 2024-03-20 DIAGNOSIS — Z6841 Body Mass Index (BMI) 40.0 and over, adult: Secondary | ICD-10-CM | POA: Diagnosis not present

## 2024-03-20 DIAGNOSIS — J90 Pleural effusion, not elsewhere classified: Secondary | ICD-10-CM | POA: Diagnosis not present

## 2024-03-20 DIAGNOSIS — K7682 Hepatic encephalopathy: Secondary | ICD-10-CM | POA: Diagnosis not present

## 2024-03-20 DIAGNOSIS — Y793 Surgical instruments, materials and orthopedic devices (including sutures) associated with adverse incidents: Secondary | ICD-10-CM | POA: Diagnosis present

## 2024-03-20 DIAGNOSIS — R918 Other nonspecific abnormal finding of lung field: Secondary | ICD-10-CM | POA: Diagnosis not present

## 2024-03-20 DIAGNOSIS — L4050 Arthropathic psoriasis, unspecified: Secondary | ICD-10-CM | POA: Diagnosis present

## 2024-03-20 DIAGNOSIS — M868X6 Other osteomyelitis, lower leg: Secondary | ICD-10-CM | POA: Diagnosis not present

## 2024-03-20 DIAGNOSIS — K700 Alcoholic fatty liver: Secondary | ICD-10-CM | POA: Diagnosis present

## 2024-03-20 DIAGNOSIS — N179 Acute kidney failure, unspecified: Secondary | ICD-10-CM | POA: Diagnosis not present

## 2024-03-20 DIAGNOSIS — T8469XA Infection and inflammatory reaction due to internal fixation device of other site, initial encounter: Secondary | ICD-10-CM | POA: Diagnosis not present

## 2024-03-20 DIAGNOSIS — Z8379 Family history of other diseases of the digestive system: Secondary | ICD-10-CM

## 2024-03-20 DIAGNOSIS — B9561 Methicillin susceptible Staphylococcus aureus infection as the cause of diseases classified elsewhere: Secondary | ICD-10-CM | POA: Diagnosis not present

## 2024-03-20 DIAGNOSIS — L03115 Cellulitis of right lower limb: Secondary | ICD-10-CM | POA: Diagnosis not present

## 2024-03-20 DIAGNOSIS — Z885 Allergy status to narcotic agent status: Secondary | ICD-10-CM

## 2024-03-20 DIAGNOSIS — R5381 Other malaise: Secondary | ICD-10-CM | POA: Diagnosis present

## 2024-03-20 DIAGNOSIS — Z713 Dietary counseling and surveillance: Secondary | ICD-10-CM

## 2024-03-20 DIAGNOSIS — S82891A Other fracture of right lower leg, initial encounter for closed fracture: Secondary | ICD-10-CM

## 2024-03-20 DIAGNOSIS — Z833 Family history of diabetes mellitus: Secondary | ICD-10-CM

## 2024-03-20 DIAGNOSIS — E871 Hypo-osmolality and hyponatremia: Secondary | ICD-10-CM | POA: Diagnosis not present

## 2024-03-20 DIAGNOSIS — B957 Other staphylococcus as the cause of diseases classified elsewhere: Secondary | ICD-10-CM | POA: Diagnosis not present

## 2024-03-20 DIAGNOSIS — L089 Local infection of the skin and subcutaneous tissue, unspecified: Secondary | ICD-10-CM | POA: Diagnosis not present

## 2024-03-20 DIAGNOSIS — F1010 Alcohol abuse, uncomplicated: Secondary | ICD-10-CM | POA: Diagnosis present

## 2024-03-20 DIAGNOSIS — Z743 Need for continuous supervision: Secondary | ICD-10-CM | POA: Diagnosis not present

## 2024-03-20 DIAGNOSIS — R457 State of emotional shock and stress, unspecified: Secondary | ICD-10-CM | POA: Diagnosis not present

## 2024-03-20 DIAGNOSIS — G4733 Obstructive sleep apnea (adult) (pediatric): Secondary | ICD-10-CM | POA: Diagnosis not present

## 2024-03-20 DIAGNOSIS — F419 Anxiety disorder, unspecified: Secondary | ICD-10-CM | POA: Diagnosis present

## 2024-03-20 DIAGNOSIS — M25471 Effusion, right ankle: Secondary | ICD-10-CM | POA: Diagnosis not present

## 2024-03-20 DIAGNOSIS — I8510 Secondary esophageal varices without bleeding: Secondary | ICD-10-CM | POA: Diagnosis present

## 2024-03-20 DIAGNOSIS — Z818 Family history of other mental and behavioral disorders: Secondary | ICD-10-CM | POA: Diagnosis not present

## 2024-03-20 DIAGNOSIS — M86671 Other chronic osteomyelitis, right ankle and foot: Secondary | ICD-10-CM | POA: Diagnosis not present

## 2024-03-20 DIAGNOSIS — Z792 Long term (current) use of antibiotics: Secondary | ICD-10-CM

## 2024-03-20 DIAGNOSIS — Z472 Encounter for removal of internal fixation device: Secondary | ICD-10-CM | POA: Diagnosis not present

## 2024-03-20 DIAGNOSIS — Z23 Encounter for immunization: Secondary | ICD-10-CM

## 2024-03-20 DIAGNOSIS — Z8249 Family history of ischemic heart disease and other diseases of the circulatory system: Secondary | ICD-10-CM

## 2024-03-20 DIAGNOSIS — M86171 Other acute osteomyelitis, right ankle and foot: Secondary | ICD-10-CM | POA: Diagnosis not present

## 2024-03-20 DIAGNOSIS — R531 Weakness: Secondary | ICD-10-CM | POA: Diagnosis not present

## 2024-03-20 DIAGNOSIS — K7030 Alcoholic cirrhosis of liver without ascites: Secondary | ICD-10-CM | POA: Diagnosis not present

## 2024-03-20 DIAGNOSIS — R0602 Shortness of breath: Secondary | ICD-10-CM | POA: Diagnosis not present

## 2024-03-20 DIAGNOSIS — K766 Portal hypertension: Secondary | ICD-10-CM | POA: Diagnosis present

## 2024-03-20 DIAGNOSIS — M1990 Unspecified osteoarthritis, unspecified site: Secondary | ICD-10-CM | POA: Diagnosis present

## 2024-03-20 DIAGNOSIS — T847XXA Infection and inflammatory reaction due to other internal orthopedic prosthetic devices, implants and grafts, initial encounter: Secondary | ICD-10-CM | POA: Diagnosis present

## 2024-03-20 DIAGNOSIS — L89516 Pressure-induced deep tissue damage of right ankle: Secondary | ICD-10-CM

## 2024-03-20 DIAGNOSIS — G4730 Sleep apnea, unspecified: Secondary | ICD-10-CM | POA: Diagnosis not present

## 2024-03-20 DIAGNOSIS — F17290 Nicotine dependence, other tobacco product, uncomplicated: Secondary | ICD-10-CM | POA: Diagnosis present

## 2024-03-20 DIAGNOSIS — E66813 Obesity, class 3: Secondary | ICD-10-CM | POA: Diagnosis present

## 2024-03-20 DIAGNOSIS — Z452 Encounter for adjustment and management of vascular access device: Secondary | ICD-10-CM | POA: Diagnosis not present

## 2024-03-20 DIAGNOSIS — D72829 Elevated white blood cell count, unspecified: Secondary | ICD-10-CM | POA: Diagnosis not present

## 2024-03-20 DIAGNOSIS — R7401 Elevation of levels of liver transaminase levels: Secondary | ICD-10-CM | POA: Diagnosis not present

## 2024-03-20 DIAGNOSIS — Z79899 Other long term (current) drug therapy: Secondary | ICD-10-CM

## 2024-03-20 DIAGNOSIS — D6959 Other secondary thrombocytopenia: Secondary | ICD-10-CM | POA: Diagnosis present

## 2024-03-20 LAB — COMPREHENSIVE METABOLIC PANEL WITH GFR
ALT: 123 U/L — ABNORMAL HIGH (ref 0–44)
AST: 117 U/L — ABNORMAL HIGH (ref 15–41)
Albumin: 2.5 g/dL — ABNORMAL LOW (ref 3.5–5.0)
Alkaline Phosphatase: 264 U/L — ABNORMAL HIGH (ref 38–126)
Anion gap: 7 (ref 5–15)
BUN: 22 mg/dL — ABNORMAL HIGH (ref 6–20)
CO2: 29 mmol/L (ref 22–32)
Calcium: 9 mg/dL (ref 8.9–10.3)
Chloride: 96 mmol/L — ABNORMAL LOW (ref 98–111)
Creatinine, Ser: 0.68 mg/dL (ref 0.61–1.24)
GFR, Estimated: >60 mL/min (ref >60)
Glucose, Bld: 90 mg/dL (ref 70–99)
Potassium: 4.3 mmol/L (ref 3.5–5.1)
Sodium: 132 mmol/L — ABNORMAL LOW (ref 135–145)
Total Bilirubin: 4.6 mg/dL — ABNORMAL HIGH (ref 0.0–1.2)
Total Protein: 6.9 g/dL (ref 6.5–8.1)

## 2024-03-20 LAB — URINALYSIS, W/ REFLEX TO CULTURE (INFECTION SUSPECTED)
Bacteria, UA: NONE SEEN
Glucose, UA: NEGATIVE mg/dL
Hgb urine dipstick: NEGATIVE
Ketones, ur: NEGATIVE mg/dL
Leukocytes,Ua: NEGATIVE
Nitrite: NEGATIVE
Protein, ur: 30 mg/dL — AB
Specific Gravity, Urine: 1.031 — ABNORMAL HIGH (ref 1.005–1.030)
pH: 5 (ref 5.0–8.0)

## 2024-03-20 LAB — CBC WITH DIFFERENTIAL/PLATELET
Abs Immature Granulocytes: 0.06 K/uL (ref 0.00–0.07)
Basophils Absolute: 0 K/uL (ref 0.0–0.1)
Basophils Relative: 0 %
Eosinophils Absolute: 0.6 K/uL — ABNORMAL HIGH (ref 0.0–0.5)
Eosinophils Relative: 4 %
HCT: 41.4 % (ref 39.0–52.0)
Hemoglobin: 13.3 g/dL (ref 13.0–17.0)
Immature Granulocytes: 1 %
Lymphocytes Relative: 10 %
Lymphs Abs: 1.3 K/uL (ref 0.7–4.0)
MCH: 32 pg (ref 26.0–34.0)
MCHC: 32.1 g/dL (ref 30.0–36.0)
MCV: 99.8 fL (ref 80.0–100.0)
Monocytes Absolute: 1.4 K/uL — ABNORMAL HIGH (ref 0.1–1.0)
Monocytes Relative: 11 %
Neutro Abs: 9.9 K/uL — ABNORMAL HIGH (ref 1.7–7.7)
Neutrophils Relative %: 74 %
Platelets: 137 K/uL — ABNORMAL LOW (ref 150–400)
RBC: 4.15 MIL/uL — ABNORMAL LOW (ref 4.22–5.81)
RDW: 16.9 % — ABNORMAL HIGH (ref 11.5–15.5)
WBC: 13.3 K/uL — ABNORMAL HIGH (ref 4.0–10.5)
nRBC: 0 % (ref 0.0–0.2)

## 2024-03-20 LAB — AMMONIA: Ammonia: 90 umol/L — ABNORMAL HIGH (ref 9–35)

## 2024-03-20 LAB — I-STAT CG4 LACTIC ACID, ED: Lactic Acid, Venous: 1.7 mmol/L (ref 0.5–1.9)

## 2024-03-20 LAB — PROTIME-INR
INR: 1.6 — ABNORMAL HIGH (ref 0.8–1.2)
Prothrombin Time: 20 s — ABNORMAL HIGH (ref 11.4–15.2)

## 2024-03-20 LAB — C-REACTIVE PROTEIN: CRP: 4.6 mg/dL — ABNORMAL HIGH (ref <1.0)

## 2024-03-20 LAB — SEDIMENTATION RATE: Sed Rate: 39 mm/h — ABNORMAL HIGH (ref 0–16)

## 2024-03-20 MED ORDER — SPIRONOLACTONE 25 MG PO TABS
25.0000 mg | ORAL_TABLET | Freq: Every day | ORAL | Status: DC
Start: 2024-03-20 — End: 2024-03-21
  Administered 2024-03-20: 25 mg via ORAL
  Filled 2024-03-20 (×2): qty 1

## 2024-03-20 MED ORDER — FUROSEMIDE 40 MG PO TABS
40.0000 mg | ORAL_TABLET | Freq: Every day | ORAL | Status: DC
Start: 2024-03-20 — End: 2024-03-21
  Administered 2024-03-20: 40 mg via ORAL
  Filled 2024-03-20: qty 1

## 2024-03-20 MED ORDER — ONDANSETRON HCL 4 MG PO TABS: 4.0000 mg | ORAL_TABLET | Freq: Four times a day (QID) | ORAL | Status: DC | PRN

## 2024-03-20 MED ORDER — NADOLOL 20 MG PO TABS
20.0000 mg | ORAL_TABLET | Freq: Every day | ORAL | Status: DC
  Administered 2024-03-20 – 2024-03-30 (×11): 20 mg via ORAL
  Filled 2024-03-20 (×11): qty 1

## 2024-03-20 MED ORDER — VANCOMYCIN HCL 2000 MG/400ML IV SOLN
2000.0000 mg | INTRAVENOUS | Status: AC
  Administered 2024-03-20: 2000 mg via INTRAVENOUS
  Filled 2024-03-20: qty 400

## 2024-03-20 MED ORDER — PANTOPRAZOLE SODIUM 40 MG PO TBEC
40.0000 mg | DELAYED_RELEASE_TABLET | Freq: Two times a day (BID) | ORAL | Status: DC
  Administered 2024-03-20 – 2024-03-30 (×21): 40 mg via ORAL
  Filled 2024-03-20 (×21): qty 1

## 2024-03-20 MED ORDER — VANCOMYCIN HCL 1250 MG/250ML IV SOLN
1250.0000 mg | Freq: Two times a day (BID) | INTRAVENOUS | Status: DC
  Administered 2024-03-21 – 2024-03-22 (×3): 1250 mg via INTRAVENOUS
  Filled 2024-03-20 (×3): qty 250

## 2024-03-20 MED ORDER — LACTULOSE 10 GM/15ML PO SOLN
30.0000 g | Freq: Three times a day (TID) | ORAL | Status: DC
  Administered 2024-03-20 – 2024-03-22 (×4): 30 g via ORAL
  Filled 2024-03-20 (×10): qty 45

## 2024-03-20 MED ORDER — SODIUM CHLORIDE 0.9 % IV SOLN
2.0000 g | Freq: Once | INTRAVENOUS | Status: AC
  Administered 2024-03-20: 2 g via INTRAVENOUS
  Filled 2024-03-20: qty 20

## 2024-03-20 MED ORDER — LOTEPREDNOL ETABONATE 0.5 % OP SUSP
1.0000 [drp] | Freq: Four times a day (QID) | OPHTHALMIC | Status: DC
  Administered 2024-03-20 – 2024-03-30 (×12): 1 [drp] via OPHTHALMIC
  Filled 2024-03-20: qty 5

## 2024-03-20 MED ORDER — ONDANSETRON HCL 4 MG/2ML IJ SOLN
4.0000 mg | Freq: Four times a day (QID) | INTRAMUSCULAR | Status: DC | PRN
Start: 2024-03-20 — End: 2024-03-30

## 2024-03-20 MED ORDER — ALBUTEROL SULFATE (2.5 MG/3ML) 0.083% IN NEBU: 2.5000 mg | INHALATION_SOLUTION | RESPIRATORY_TRACT | Status: DC | PRN

## 2024-03-20 MED ORDER — LIDOCAINE-EPINEPHRINE (PF) 2 %-1:200000 IJ SOLN
INTRAMUSCULAR | Status: AC
  Filled 2024-03-20: qty 20

## 2024-03-20 MED ORDER — SODIUM CHLORIDE 0.9 % IV SOLN
1.0000 g | INTRAVENOUS | Status: DC
  Administered 2024-03-22: 1 g via INTRAVENOUS
  Filled 2024-03-20 (×2): qty 10

## 2024-03-20 MED ORDER — FENTANYL CITRATE (PF) 50 MCG/ML IJ SOSY
12.5000 ug | PREFILLED_SYRINGE | INTRAMUSCULAR | Status: DC | PRN
  Administered 2024-03-20 (×2): 50 ug via INTRAVENOUS
  Administered 2024-03-21 (×2): 25 ug via INTRAVENOUS
  Administered 2024-03-27: 50 ug via INTRAVENOUS
  Filled 2024-03-20 (×5): qty 1

## 2024-03-20 NOTE — ED Provider Notes (Signed)
 [place] EMERGENCY DEPARTMENT AT [facility] Provider Note   CSN: [id number] Arrival date & time: 03/20/24  9493     Patient presents with: Leg Swelling   Thomas [name] is a 48 y.o. 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  He also started having some gradual onset shortness of breath is worse when he is up trying to walk around.  He does take Lasix  for this and has been seen for paracentesis before.  He denies any fevers or chills.  Reports he has been seen for his foot swelling last week and has not gotten any better with protecting the area in his walking boot and elevation and lasix .    HPI     Prior to Admission medications   Medication Sig Start Date End Date Taking? Authorizing Provider  albuterol  (VENTOLIN  HFA) 108 (90 Base) MCG/ACT inhaler Inhale 1-2 puffs into the lungs every 6 (six) hours as needed for wheezing or shortness of breath. 03/11/24  Yes Simon Lavonia SAILOR, MD  b complex vitamins capsule Take 1 capsule by mouth daily.   Yes [provider]  clobetasol  cream (TEMOVATE ) 0.05 % Apply topically 2 (two) times daily. Apply to Psoriatic plaques on the extremities, trunk or scalp. DO not apply to face, intertriginous areas (armpits, groin, under pannus, gluteal cleft), or genitals. 02/24/24  Yes Tobie Yetta HERO, MD  Cyanocobalamin (VITAMIN B12 PO) Take 1 tablet by mouth daily.   Yes [provider]  FOLIC ACID  PO Take 1 tablet by mouth daily.   Yes [provider]  furosemide  (LASIX ) 40 MG tablet Take 1 tablet (40 mg total) by mouth daily. 02/24/24  Yes Tobie Yetta HERO, MD  lactulose (CHRONULAC) 10 GM/15ML solution Take 45 mLs (30 g total) by mouth 3 (three) times daily.  03/13/24  Yes Dasie Faden, MD  loteprednol (LOTEMAX) 0.5 % ophthalmic suspension Place 1 drop into both eyes 4 (four) times daily. 02/01/24  Yes [provider]  methylPREDNISolone  (MEDROL ) 8 MG tablet Take 3 tablets (24 mg total) by mouth daily for 7 days, THEN 2 tablets (16 mg total) daily for 7 days, THEN 1 tablet (8 mg total) daily for 14 days. 02/29/24 03/28/24 Yes Rice, Lonni ORN, MD  Multiple Vitamins-Minerals (ZINC PO) Take 1 tablet by mouth daily.   Yes [provider]  nadolol  (CORGARD ) 20 MG tablet Take 1 tablet (20 mg total) by mouth daily. 03/11/23 03/20/24 Yes Lemmon, Delon Gibson, PA  pantoprazole  (PROTONIX ) 40 MG tablet Take 1 tablet (40 mg total) by mouth 2 (two) times daily. 12/24/23  Yes Berneta Elsie Sayre, MD  spironolactone  (ALDACTONE ) 25 MG tablet Take 1 tablet (25 mg total) by mouth daily. 02/25/24  Yes Tobie Yetta HERO, MD  VITAMIN D  PO Take 1 tablet by mouth daily.   Yes [provider]  oxyCODONE  (OXY IR/ROXICODONE ) 5 MG immediate release tablet Take 1 tablet (5 mg total) by mouth every 8 (eight) hours as needed for severe pain (pain score 7-10). Patient not taking: Reported on 03/20/2024 02/24/24   Tobie Yetta HERO, MD  Secukinumab (COSENTYX UNOREADY) 300 MG/2ML SOAJ Inject 300mg  into the skin at [facility] 0, 1, 2, 3 03/10/24   Rice, Lonni ORN, MD    Allergies: Codeine    Review of  Systems  Respiratory:  Positive for shortness of breath.   Cardiovascular:  Positive for leg swelling.  Skin:  Positive for wound.    Updated Vital Signs BP 127/60 (BP Location: Left Arm)   Pulse 83   Temp 98.6 F (37 C) (Oral)   Resp 18   SpO2 94%   Physical Exam Vitals and nursing note reviewed.  Constitutional:      General: He is not in acute distress.    Appearance: He is not toxic-appearing.  HENT:     Head: Normocephalic and atraumatic.  Eyes:     General: No scleral icterus.    Conjunctiva/sclera: Conjunctivae normal.  Cardiovascular:      Rate and Rhythm: Normal rate and regular rhythm.     Pulses: Normal pulses.     Heart sounds: Normal heart sounds.  Pulmonary:     Effort: Pulmonary effort is normal. No respiratory distress.     Breath sounds: Normal breath sounds.     Comments: Decreased breath sound right lung Abdominal:     General: Abdomen is flat. Bowel sounds are normal.     Palpations: Abdomen is soft.     Tenderness: There is no abdominal tenderness.  Musculoskeletal:     Right lower leg: Edema present.     Comments: Right lateral ankle wound. Over distal incision, otherwise well appearing incision.  Ulcer 2cmx1cm with surrounding erythema. Draining, no purulence, but with foul smell.   Skin:    General: Skin is warm and dry.     Findings: No lesion.  Neurological:     General: No focal deficit present.     Mental Status: He is alert and oriented to person, place, and time. Mental status is at baseline.        (all labs ordered are listed, but only abnormal results are displayed) Labs Reviewed  URINALYSIS, W/ REFLEX TO CULTURE (INFECTION SUSPECTED) - Abnormal; Notable for the following components:      Result Value   Color, Urine AMBER (*)    APPearance HAZY (*)    Specific Gravity, Urine 1.031 (*)    Bilirubin Urine SMALL (*)    Protein, ur 30 (*)    All other components within normal limits  CBC WITH DIFFERENTIAL/PLATELET - Abnormal; Notable for the following components:   WBC 13.3 (*)    RBC 4.15 (*)    RDW 16.9 (*)    Platelets 137 (*)    Neutro Abs 9.9 (*)    Monocytes Absolute 1.4 (*)    Eosinophils Absolute 0.6 (*)    All other components within normal limits  COMPREHENSIVE METABOLIC PANEL WITH GFR - Abnormal; Notable for the following components:   Sodium 132 (*)    Chloride 96 (*)    BUN 22 (*)    Albumin  2.5 (*)    AST 117 (*)    ALT 123 (*)    Alkaline Phosphatase 264 (*)    Total Bilirubin 4.6 (*)    All other components within normal limits  PROTIME-INR - Abnormal;  Notable for the following components:   Prothrombin Time 20.0 (*)    INR 1.6 (*)    All other components within normal limits  CBC WITH DIFFERENTIAL/PLATELET  AMMONIA  I-STAT CG4 LACTIC ACID, ED    EKG: None  Radiology: VAS US  LOWER EXTREMITY VENOUS (DVT) (7a-7p) Result Date: 03/20/2024  Lower Venous DVT Study Patient Name:  YAGO LUDVIGSEN  Date of Exam:   03/20/2024 Medical Rec #: [id number]  Accession #:    [id number] Date of Birth: 1975/10/08        Patient Gender: M Patient Age:   4 years Exam Location:  [facility] Procedure:      VAS US  LOWER EXTREMITY VENOUS (DVT) Referring Phys: Venetia Prewitt --------------------------------------------------------------------------------  Indications: Swelling, Edema, SOB, ulceration, and Pain.  Comparison Study: Previous study on 10.1.2025. Performing Technologist: Edilia Elden Appl  Examination Guidelines: A complete evaluation includes B-mode imaging, spectral Doppler, color Doppler, and power Doppler as needed of all accessible portions of each vessel. Bilateral testing is considered an integral part of a complete examination. Limited examinations for reoccurring indications may be performed as noted. The reflux portion of the exam is performed with the patient in reverse Trendelenburg.  +---------+---------------+---------+-----------+----------+--------------+ RIGHT    CompressibilityPhasicitySpontaneityPropertiesThrombus Aging +---------+---------------+---------+-----------+----------+--------------+ CFV      Full           Yes      Yes                                 +---------+---------------+---------+-----------+----------+--------------+ SFJ      Full           Yes      Yes                                 +---------+---------------+---------+-----------+----------+--------------+ FV Prox  Full                                                         +---------+---------------+---------+-----------+----------+--------------+ FV Mid   Full                                                        +---------+---------------+---------+-----------+----------+--------------+ FV DistalFull                                                        +---------+---------------+---------+-----------+----------+--------------+ PFV      Full                                                        +---------+---------------+---------+-----------+----------+--------------+ POP      Full           Yes      Yes                                 +---------+---------------+---------+-----------+----------+--------------+ PTV      Full                                                        +---------+---------------+---------+-----------+----------+--------------+  PERO     Full                                                        +---------+---------------+---------+-----------+----------+--------------+   +----+---------------+---------+-----------+----------+--------------+ LEFTCompressibilityPhasicitySpontaneityPropertiesThrombus Aging +----+---------------+---------+-----------+----------+--------------+ CFV Full           Yes      Yes                                 +----+---------------+---------+-----------+----------+--------------+ SFJ Full           Yes      Yes                                 +----+---------------+---------+-----------+----------+--------------+     Summary: RIGHT: - There is no evidence of deep vein thrombosis in the lower extremity.  - No cystic structure found in the popliteal fossa.  LEFT: - No evidence of common femoral vein obstruction.   *See table(s) above for measurements and observations. Electronically signed by Gaile New MD on 03/20/2024 at 11:55:49 AM.    Final    DG Chest Port 1 View Result Date: 03/20/2024 EXAM: 1 VIEW(S) XRAY OF THE CHEST 03/20/2024 08:10:00 AM COMPARISON:  03/09/2024 CLINICAL HISTORY: SOB (shortness of breath) 141880. SOB and right lateral ankle wound with swelling. FINDINGS: LUNGS AND PLEURA: Large right pleural effusion, increased from prior exam. Hazy opacity throughout right lung may represent atelectasis, though airspace disease cannot be excluded. No pulmonary edema. No pneumothorax. HEART AND MEDIASTINUM: No acute abnormality of the cardiac and mediastinal silhouettes. BONES AND SOFT TISSUES: No acute osseous abnormality. IMPRESSION: 1. Large right pleural effusion, increased from prior exam. 2. Hazy opacity throughout the right lung that may represent atelectasis; superimposed airspace disease cannot be excluded. Electronically signed by: Waddell Calk MD 03/20/2024 08:20 AM EDT RP Workstation: [id number]   DG Ankle Complete Right Result Date: 03/20/2024 EXAM: 3 OR MORE VIEW(S) XRAY OF THE RIGHT ANKLE 03/20/2024 08:10:00 AM CLINICAL HISTORY: lateral ankle wound. SOB and right lateral ankle wound with swelling. COMPARISON: 03/13/2024 FINDINGS: BONES AND JOINTS: Status post ORIF of lateral and medial malleolus. Intact orthopedic hardware without periprosthetic fracture or lucency. No focal osseous lesion. No joint dislocation. Small ankle joint effusion. SOFT TISSUES: Focus of soft tissue gas about the lateral malleolus. Interval progression of diffuse soft tissue edema about the ankle and foot with new marked dorsal soft tissue swelling. IMPRESSION: 1. Interval progression of diffuse soft tissue edema about the ankle and foot with new marked dorsal soft tissue swelling and small ankle joint effusion. 2. New foci of soft tissue gas about the lateral malleolus. 3. Status post ORIF of lateral and medial malleolus with intact orthopedic hardware. No radiographic signs of acute osteomyelitis though the presence of soft tissue gas is concerning for an underlying infection Electronically signed by: Waddell Calk MD 03/20/2024 08:20 AM EDT RP Workstation:  [id number]     Procedures   Medications Ordered in the ED  cefTRIAXone  (ROCEPHIN ) 2 g in sodium chloride  0.9 % 100 mL IVPB (2 g Intravenous New Bag/Given 03/20/24 1207)  lactulose (CHRONULAC) 10 GM/15ML solution 30 g (has no administration in time range)  furosemide  (LASIX ) tablet 40 mg (has no  administration in time range)  loteprednol (LOTEMAX) 0.5 % ophthalmic suspension 1 drop (has no administration in time range)  nadolol  (CORGARD ) tablet 20 mg (has no administration in time range)  pantoprazole  (PROTONIX ) EC tablet 40 mg (has no administration in time range)  spironolactone  (ALDACTONE ) tablet 25 mg (has no administration in time range)  ondansetron  (ZOFRAN ) tablet 4 mg (has no administration in time range)    Or  ondansetron  (ZOFRAN ) injection 4 mg (has no administration in time range)  albuterol  (PROVENTIL ) (2.5 MG/3ML) 0.083% nebulizer solution 2.5 mg (has no administration in time range)  fentaNYL  (SUBLIMAZE ) injection 12.5-50 mcg (has no administration in time range)    Clinical Course as of 03/20/24 1235  Sun Mar 20, 2024  1044 Ortho to see, likely wash out in OR, NPO at midnight, okay to treat with Abx for cellulitis. Admit to hospital team.  [JB]  1105 Hospitalist agrees to come see patient for admission. [JB]    Clinical Course User Index [JB] Buna Cuppett, Warren SAILOR, PA-C                                 Medical Decision Making Amount and/or Complexity of Data Reviewed Labs: ordered. Radiology: ordered.  Risk Decision regarding hospitalization.   This patient presents to the ED for concern of ankle pain, shortness of breath, this involves an extensive number of treatment options, and is a complaint that carries with it a high risk of complications and morbidity.  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   Lab Tests:  I personally interpreted labs.  The pertinent results include:   Patient has leukocytosis of 13.3 with neutrophil predominance CMP shows elevated liver enzymes with a total bilirubin of 4.6 INR is 1.6   Imaging Studies ordered:  I ordered imaging studies including chest x-ray, right ankle x-ray and DVT study I independently visualized and interpreted imaging which showed large right pleural effusion increased from prior exam with hazy opacity of the right lung and possible superimposed infection. Given patient's clinical symptoms feel pneumonia is less likely at this time however does have white count. X-ray of right ankle shows status post ORIF with intact hardware and no sign of acute osteomyelitis however there is soft tissue gas which is concerning for underlying infection, does have interval progression of soft tissue edema and small joint effusion.  I agree with the radiologist interpretation   Cardiac Monitoring: / EKG:  The patient was maintained on a cardiac monitor.     Consultations Obtained:  I requested consultation with the orthopedics,  and discussed lab and imaging findings as well as pertinent plan - they recommend: Will see patient in consult.  They are recommending admission to hospital team.  Will hold off on MRI as they will see the patient and make ultimate disposition.  May require washout tomorrow and recommending n.p.o. at midnight tonight.  Okay to start antibiotics for cellulitis. Consulted with hospital team and discussed patient's presentation.  They agree to see patient for admission.   Problem List /  ED Course / Critical  interventions / Medication management  Patient presenting with shortness of breath.  He is not having oxygen requirement and is in no acute distress.  Does have large right pleural effusion which is gradually increasing since his last thoracentesis.  Believed to be in relation to his cirrhosis.  I have lower suspicion for any superimposed pneumonia.  Patient also found to have a wound to his right ankle.  He has some mild surrounding cellulitis and this is over his hardware.  He is neurovascularly intact.  I have ruled out DVT and there is no sign of osteomyelitis on x-ray.  I did discuss this with orthopedics will see the patient in consult. I ordered medication including Rocephin  ordered. Reevaluation of the patient after these medicines showed that the patient stayed the same I have reviewed the patients home medicines and have made adjustments as needed. Patient will likely benefit from thoracentesis during his admission for his shortness of breath.  Will be started on antibiotic for cellulitis and to be evaluated by orthopedics.         Final diagnoses:  Recurrent right pleural effusion  Pressure injury of deep tissue of right ankle    ED Discharge Orders     None          Shermon Warren SAILOR, PA-C 03/20/24 1235    Charlyn Sora, MD 03/20/24 1349

## 2024-03-20 NOTE — Procedures (Signed)
 Ultrasound-guided therapeutic right thoracentesis performed yielding 2.2 liters of slightly hazy, yellow fluid. No immediate complications. Follow-up chest x-ray pending. EBL none.      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   Kevin Eshan Trupiano,PA-C

## 2024-03-20 NOTE — H&P (Signed)
 History and Physical  TAJE LITTLER FMW:[id number] DOB: 05-25-1976 DOA: 03/20/2024  PCP: Berneta Elsie Sayre, MD   Chief Complaint: Right ankle swelling and pain  HPI: Thomas [name] is a 48 y.o. male with medical history significant for psoriasis, psoriatic arthritis, obesity and decompensated alcoholic liver cirrhosis being admitted to the hospital with cellulitis and concern for wound infection at the site of bimalleolar right ankle fracture which was surgically repaired with hardware July 2024.  Most recently, patient was admitted to the hospital September 2025 with right-sided hepatic hydrothorax and underwent thoracentesis yielding 650 cc of fluid.  He was also found to be in decompensated liver cirrhosis, started on Lasix  and Aldactone , as well as continuation of nadolol  and Protonix .  More recently, he was seen in the ER with mild confusion and started on lactulose.  In the interim, he has seen his rheumatologist due to right ankle swelling, was started on p.o. steroids however patient says that the right ankle swelling has not really improved, although it initially did so after his last hospital stay in September.  In any case, few days ago when the patient was getting out of the shower, he heard a pop and some serosanguineous fluid came out of a new opening at the distal end of his ankle incision.  He denies any fevers, chills, nausea vomiting or other systemic symptoms.  On workup in the emergency department, patient was started on empiric IV antibiotics, and ER provider discussed with orthopedic surgery who plans to see the patient in consultation today with potential surgical debridement tomorrow.  Review of Systems: Please see HPI for pertinent positives and negatives. A complete 10 system review of systems are otherwise negative.  Past Medical History:  Diagnosis Date   Acute conjunctivitis of both eyes 10/22/2021   Acute sinusitis 06/16/2013   Alcoholic cirrhosis (HCC)     Alcoholic fatty liver 01/16/2010   Needs final HBV and HAV vaccines on or after 10/25/2012    Alcoholism (HCC) 12/25/2011   Allergic rhinitis    Childhood asthma    Elevated transaminase level 06/10/2007   AST: 80 ALT: 136 in 8/11: Hepatitis A., B and C negative.    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    Sleep apnea    Past Surgical History:  Procedure Laterality Date   ESOPHAGEAL BANDING  12/26/2022   Procedure: ESOPHAGEAL BANDING;  Surgeon: Shila Gustav GAILS, MD;  [facility]: [facility];  Service: Gastroenterology;;   ESOPHAGOGASTRODUODENOSCOPY (EGD) WITH PROPOFOL  N/A 07/05/2022   Procedure: ESOPHAGOGASTRODUODENOSCOPY (EGD) WITH PROPOFOL ;  Surgeon: Shila Gustav GAILS, MD;  Location: [facility];  Service: Gastroenterology;  Laterality: N/A;   ESOPHAGOGASTRODUODENOSCOPY (EGD) WITH PROPOFOL  N/A 12/26/2022   Procedure: ESOPHAGOGASTRODUODENOSCOPY (EGD) WITH PROPOFOL ;  Surgeon: Shila Gustav GAILS, MD;  Location: [facility];  Service: Gastroenterology;  Laterality: N/A;   ESOPHAGOGASTRODUODENOSCOPY (EGD) WITH PROPOFOL  N/A 02/12/2023   Procedure: ESOPHAGOGASTRODUODENOSCOPY (EGD) WITH PROPOFOL ;  Surgeon: Shila Gustav GAILS, MD;  Location: [facility];  Service: Gastroenterology;  Laterality: N/A;   ORIF ANKLE FRACTURE Right 12/28/2022   Procedure: OPEN REDUCTION INTERNAL FIXATION (ORIF) ANKLE FRACTURE;  Surgeon: Josefina Chew, MD;  Location: [facility];  Service: Orthopedics;  Laterality: Right;   Social History:  reports that he has been smoking cigars. He has been exposed to tobacco smoke.  He has never used smokeless tobacco. He reports that he does not currently use alcohol. He reports that he does not use drugs.  Allergies  Allergen Reactions   Codeine Other (See Comments)    Jittery     Family  History  Problem Relation Age of Onset   Liver disease Mother    Depression Mother    Liver disease Father    Diabetes Father    Hypertension Father    Liver disease Sister    Diabetes Other    Colon cancer Neg Hx    Esophageal cancer Neg Hx    Stomach cancer Neg Hx    Colon polyps Neg Hx      Prior to Admission medications   Medication Sig Start Date End Date Taking? Authorizing Provider  albuterol  (VENTOLIN  HFA) 108 (90 Base) MCG/ACT inhaler Inhale 1-2 puffs into the lungs every 6 (six) hours as needed for wheezing or shortness of breath. 03/11/24   Simon Lavonia SAILOR, MD  clobetasol  cream (TEMOVATE ) 0.05 % Apply topically 2 (two) times daily. Apply to Psoriatic plaques on the extremities, trunk or scalp. DO not apply to face, intertriginous areas (armpits, groin, under pannus, gluteal cleft), or genitals. 02/24/24   Tobie Yetta HERO, MD  diclofenac  Sodium (VOLTAREN ) 1 % GEL Apply 4 g topically 4 (four) times daily as needed (knee pain). 02/24/24   Tobie Yetta HERO, MD  furosemide  (LASIX ) 40 MG tablet Take 1 tablet (40 mg total) by mouth daily. 02/24/24   Tobie Yetta HERO, MD  lactulose (CHRONULAC) 10 GM/15ML solution Take 45 mLs (30 g total) by mouth 3 (three) times daily. 03/13/24   Dasie Faden, MD  loteprednol (LOTEMAX) 0.5 % ophthalmic suspension Place 1 drop into both eyes 4 (four) times daily. 02/01/24   [provider]  methylPREDNISolone  (MEDROL ) 8 MG tablet Take 3 tablets (24 mg total) by mouth daily for 7 days, THEN 2 tablets (16 mg total) daily for 7 days, THEN 1 tablet (8 mg total) daily for 14 days. 02/29/24 03/28/24  Jeannetta Lonni ORN, MD  nadolol  (CORGARD ) 20 MG tablet Take 1 tablet (20 mg total) by mouth daily. 03/11/23 03/10/24  Beather Delon Gibson, PA  oxyCODONE  (OXY IR/ROXICODONE ) 5 MG immediate release tablet Take 1 tablet (5 mg total) by mouth every 8 (eight) hours as needed for severe pain (pain score 7-10). 02/24/24   Tobie Yetta HERO, MD  pantoprazole  (PROTONIX ) 40  MG tablet Take 1 tablet (40 mg total) by mouth 2 (two) times daily. 12/24/23   Berneta Elsie Sayre, MD  Secukinumab (COSENTYX UNOREADY) 300 MG/2ML SOAJ Inject 300mg  into the skin at [facility] 0, 1, 2, 3 Patient not taking: Reported on 03/10/2024 03/10/24   Jeannetta Lonni ORN, MD  spironolactone  (ALDACTONE ) 25 MG tablet Take 1 tablet (25 mg total) by mouth daily. 02/25/24   Tobie Yetta HERO, MD    Physical Exam: BP 127/60 (BP Location: Left Arm)   Pulse 83   Temp 97.9 F (36.6 C) (Oral)   Resp 18   SpO2 94%  General:  Alert, oriented, calm, in no acute distress, pleasant and cooperative, looks nontoxic Cardiovascular: RRR, no murmurs or rubs, no peripheral edema  Respiratory: clear to auscultation bilaterally, no wheezes, no crackles  Abdomen: soft, nontender, obese Musculoskeletal: no joint effusions, normal range of motion  Psychiatric: appropriate affect, normal speech  Skin: He has an edematous right lower extremity and ankle, with associated mild erythema around his lateral ankle surgical site.  There is an  open area of wound dehiscence, with no drainage currently.              Labs on Admission:  Basic Metabolic Panel: Recent Labs  Lab 03/13/24 1442 03/20/24 0730  NA 133* 132*  K 4.1 4.3  CL 95* 96*  CO2 28 29  GLUCOSE 98 90  BUN 18 22*  CREATININE 0.74 0.68  CALCIUM 9.2 9.0   Liver Function Tests: Recent Labs  Lab 03/13/24 1442 03/20/24 0730  AST 120* 117*  ALT 139* 123*  ALKPHOS 236* 264*  BILITOT 5.3* 4.6*  PROT 6.9 6.9  ALBUMIN  2.8* 2.5*   No results for input(s): LIPASE, AMYLASE in the last 168 hours. Recent Labs  Lab 03/13/24 1442  AMMONIA 78*   CBC: Recent Labs  Lab 03/13/24 1442 03/20/24 0600  WBC 17.0* 13.3*  NEUTROABS 14.1* 9.9*  HGB 13.4 13.3  HCT 41.4 41.4  MCV 97.9 99.8  PLT 136* 137*   Cardiac Enzymes: No results for input(s): CKTOTAL, CKMB, CKMBINDEX, TROPONINI in the last 168 hours. BNP (last 3 results) Recent Labs     02/20/24 1723 03/09/24 1351  BNP 33.0 30.6    ProBNP (last 3 results) No results for input(s): PROBNP in the last 8760 hours.  CBG: No results for input(s): GLUCAP in the last 168 hours.  Radiological Exams on Admission: VAS US  LOWER EXTREMITY VENOUS (DVT) (7a-7p) Result Date: 03/20/2024  Lower Venous DVT Study Patient Name:  TSUTOMU BARFOOT  Date of Exam:   03/20/2024 Medical Rec #: [id number]       Accession #:    [id number] Date of Birth: [birth year]        Patient Gender: M Patient Age:   38 years Exam Location:  [facility] Procedure:      VAS US  LOWER EXTREMITY VENOUS (DVT) Referring Phys: WARREN BARRETT --------------------------------------------------------------------------------  Indications: Swelling, Edema, SOB, ulceration, and Pain.  Comparison Study: Previous study on 10.1.2025. Performing Technologist: Edilia Elden Appl  Examination Guidelines: A complete evaluation includes B-mode imaging, spectral Doppler, color Doppler, and power Doppler as needed of all accessible portions of each vessel. Bilateral testing is considered an integral part of a complete examination. Limited examinations for reoccurring indications may be performed as noted. The reflux portion of the exam is performed with the patient in reverse Trendelenburg.  +---------+---------------+---------+-----------+----------+--------------+ RIGHT    CompressibilityPhasicitySpontaneityPropertiesThrombus Aging +---------+---------------+---------+-----------+----------+--------------+ CFV      Full           Yes      Yes                                 +---------+---------------+---------+-----------+----------+--------------+ SFJ      Full           Yes      Yes                                 +---------+---------------+---------+-----------+----------+--------------+ FV Prox  Full                                                         +---------+---------------+---------+-----------+----------+--------------+ FV Mid   Full                                                        +---------+---------------+---------+-----------+----------+--------------+  FV DistalFull                                                        +---------+---------------+---------+-----------+----------+--------------+ PFV      Full                                                        +---------+---------------+---------+-----------+----------+--------------+ POP      Full           Yes      Yes                                 +---------+---------------+---------+-----------+----------+--------------+ PTV      Full                                                        +---------+---------------+---------+-----------+----------+--------------+ PERO     Full                                                        +---------+---------------+---------+-----------+----------+--------------+   +----+---------------+---------+-----------+----------+--------------+ LEFTCompressibilityPhasicitySpontaneityPropertiesThrombus Aging +----+---------------+---------+-----------+----------+--------------+ CFV Full           Yes      Yes                                 +----+---------------+---------+-----------+----------+--------------+ SFJ Full           Yes      Yes                                 +----+---------------+---------+-----------+----------+--------------+     Summary: RIGHT: - There is no evidence of deep vein thrombosis in the lower extremity.  - No cystic structure found in the popliteal fossa.  LEFT: - No evidence of common femoral vein obstruction.   *See table(s) above for measurements and observations.    Preliminary    DG Chest Port 1 View Result Date: 03/20/2024 EXAM: 1 VIEW(S) XRAY OF THE CHEST 03/20/2024 08:10:00 AM COMPARISON: 03/09/2024 CLINICAL HISTORY: SOB (shortness of breath) 141880. SOB  and right lateral ankle wound with swelling. FINDINGS: LUNGS AND PLEURA: Large right pleural effusion, increased from prior exam. Hazy opacity throughout right lung may represent atelectasis, though airspace disease cannot be excluded. No pulmonary edema. No pneumothorax. HEART AND MEDIASTINUM: No acute abnormality of the cardiac and mediastinal silhouettes. BONES AND SOFT TISSUES: No acute osseous abnormality. IMPRESSION: 1. Large right pleural effusion, increased from prior exam. 2. Hazy opacity throughout the right lung that may represent atelectasis; superimposed airspace disease cannot be excluded. Electronically signed by: Waddell Calk MD 03/20/2024 08:20 AM EDT RP Workstation: [id number]   DG Ankle Complete  Right Result Date: 03/20/2024 EXAM: 3 OR MORE VIEW(S) XRAY OF THE RIGHT ANKLE 03/20/2024 08:10:00 AM CLINICAL HISTORY: lateral ankle wound. SOB and right lateral ankle wound with swelling. COMPARISON: 03/13/2024 FINDINGS: BONES AND JOINTS: Status post ORIF of lateral and medial malleolus. Intact orthopedic hardware without periprosthetic fracture or lucency. No focal osseous lesion. No joint dislocation. Small ankle joint effusion. SOFT TISSUES: Focus of soft tissue gas about the lateral malleolus. Interval progression of diffuse soft tissue edema about the ankle and foot with new marked dorsal soft tissue swelling. IMPRESSION: 1. Interval progression of diffuse soft tissue edema about the ankle and foot with new marked dorsal soft tissue swelling and small ankle joint effusion. 2. New foci of soft tissue gas about the lateral malleolus. 3. Status post ORIF of lateral and medial malleolus with intact orthopedic hardware. No radiographic signs of acute osteomyelitis though the presence of soft tissue gas is concerning for an underlying infection Electronically signed by: Waddell Calk MD 03/20/2024 08:20 AM EDT RP Workstation: [id number]   Assessment/Plan JAHKAI YANDELL is a 48 y.o. male with  medical history significant for psoriasis, psoriatic arthritis, obesity and decompensated alcoholic liver cirrhosis being admitted to the hospital with cellulitis and concern for wound infection at the site of bimalleolar right ankle fracture which was surgically repaired with hardware July 2024.  Cellulitis and wound infection-at the site of his prior surgical repair of his bimalleolar ankle fracture July 2024.  He also has chronic skin changes and arthritis in this area, for which he is followed by rheumatology.  Most recently, he has been on a steroid taper. -Inpatient admission -Empiric IV vancomycin and IV cefepime -Follow-up blood cultures -Will defer to orthopedics regarding further imaging, anticipate he may need OR washout in the morning -Pain control as needed  Decompensated alcoholic cirrhosis-with esophageal varices and portal hypertension.  Clinically patient appears to be stable. -Lasix , Aldactone , nadolol , Protonix , lactulose -Discussed with patient need for complete alcohol cessation  Recurrent hepatic hydrothorax-patient has had some mild dyspnea, but no severe symptoms.  Stable on room air. -IR consult for routine therapeutic thoracentesis  Psoriatic arthritis-continue steroid taper which was prescribed by his rheumatologist Dr. Jeannetta  Thrombocytopenia and hyponatremia-related to cirrhosis  DVT prophylaxis: SCDs only    Code Status: Full Code  Consults called: Orthopedic surgery  Admission status: The appropriate patient status for this patient is INPATIENT. Inpatient status is judged to be reasonable and necessary in order to provide the required intensity of service to ensure the patient's safety. The patient's presenting symptoms, physical exam findings, and initial radiographic and laboratory data in the context of their chronic comorbidities is felt to place them at high risk for further clinical deterioration. Furthermore, it is not anticipated that the patient will  be medically stable for discharge from the hospital within 2 midnights of admission.    I certify that at the point of admission it is my clinical judgment that the patient will require inpatient hospital care spanning beyond 2 midnights from the point of admission due to high intensity of service, high risk for further deterioration and high frequency of surveillance required  Time spent: 59 minutes  Jonanthan Bolender CHRISTELLA Gail MD [facility] Pager [phone]  If 7PM-7AM, please contact night-coverage [email or web address] Password [email or web address]  03/20/2024, 11:49 AM

## 2024-03-20 NOTE — Progress Notes (Signed)
 Pharmacy Antibiotic Note  Thomas [name] is a 48 y.o. male admitted on 03/20/2024 with cellulitis/ankle infection.  Pharmacy has been consulted for Vanco dosing.  Active Problem(s): Right foot swelling and drainage at the site of bimalleolar right ankle fracture which was surgically repaired with hardware July 2024.  ID: R foot/ankle infection Tmax 99.1, WBC 13.3, Scr <1  Vanco 10/12>> Rocephin  10/12>>  Plan: Need debridement 10/13 Rocephin  1g IV q24h  Vanco 2g IV x 1 in ED Vancomycin 1250 mg IV Q 12 hrs. Goal AUC 400-550. Expected AUC: 476 SCr used: 0.8     Temp (24hrs), Avg:98.7 F (37.1 C), Min:97.9 F (36.6 C), Max:99.5 F (37.5 C)  Recent Labs  Lab 03/20/24 0545 03/20/24 0600 03/20/24 0730  WBC  --  13.3*  --   CREATININE  --   --  0.68  LATICACIDVEN 1.7  --   --     Estimated Creatinine Clearance: 138.4 mL/min (by C-G formula based on SCr of 0.68 mg/dL).    Allergies  Allergen Reactions   Codeine Other (See Comments)    Jittery     Thomas [name] Thomas [name], PharmD, BCPS Clinical Staff Pharmacist  [name] 03/20/2024 6:33 PM

## 2024-03-20 NOTE — H&P (View-Only) (Signed)
 Orthopedic Consult  Patient ID: Thomas [name] MRN: [id number] DOB/AGE: [birth year] 48 y.o.  Reason for Consult: Right foot swelling and drainage Referring Physician: Zella  HPI: Thomas [name] is an 48 y.o. male with history of prior right ankle fracture approximately year ago.  This was treated with open reduction internal fixation by one of my partners, Dr. Josefina.  He had done well for several months.  He was walking even dancing on the ankle until approximately 2 months ago and then started noticing repeated episodes of swelling in the ankle.  This initially thought possibly to relate to his psoriasis or a to some other inflammatory condition.  He was treated in urgent care with steroids.  This seemed to help and then this fungal so came back.  This lasted until a few days ago when he noticed drainage coming out of his incision line while in the shower.  He continues to have drainage from the outer part of his ankle with diffuse swelling.  He has had some problems with your retention in the past and does take Lasix  but his right leg is much more swollen than the left.  He has several more medical morbidities including alcoholic cirrhosis and morbid obesity  Past Medical History:  Diagnosis Date   Acute conjunctivitis of both eyes 10/22/2021   Acute sinusitis 06/16/2013   Alcoholic cirrhosis (HCC)    Alcoholic fatty liver 01/16/2010   Needs final HBV and HAV vaccines on or after 10/25/2012    Alcoholism (HCC) 12/25/2011   Allergic rhinitis    Childhood asthma    Elevated transaminase level 06/10/2007   AST: 80 ALT: 136 in 8/11: Hepatitis A., B and C negative.    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    Sleep apnea     Past Surgical History:  Procedure Laterality Date   ESOPHAGEAL BANDING   12/26/2022   Procedure: ESOPHAGEAL BANDING;  Surgeon: Shila Gustav GAILS, MD;  [facility]: [facility];  Service: Gastroenterology;;   ESOPHAGOGASTRODUODENOSCOPY (EGD) WITH PROPOFOL  N/A 07/05/2022   Procedure: ESOPHAGOGASTRODUODENOSCOPY (EGD) WITH PROPOFOL ;  Surgeon: Shila Gustav GAILS, MD;  Location: [facility];  Service: Gastroenterology;  Laterality: N/A;   ESOPHAGOGASTRODUODENOSCOPY (EGD) WITH PROPOFOL  N/A 12/26/2022   Procedure: ESOPHAGOGASTRODUODENOSCOPY (EGD) WITH PROPOFOL ;  Surgeon: Shila Gustav GAILS, MD;  Location: [facility];  Service: Gastroenterology;  Laterality: N/A;   ESOPHAGOGASTRODUODENOSCOPY (EGD) WITH PROPOFOL  N/A 02/12/2023   Procedure: ESOPHAGOGASTRODUODENOSCOPY (EGD) WITH PROPOFOL ;  Surgeon: Shila Gustav GAILS, MD;  Location: [facility];  Service: Gastroenterology;  Laterality: N/A;   ORIF ANKLE FRACTURE Right 12/28/2022   Procedure: OPEN REDUCTION INTERNAL FIXATION (ORIF) ANKLE FRACTURE;  Surgeon: Josefina Chew, MD;  Location: [facility];  Service: Orthopedics;  Laterality: Right;    Family History  Problem Relation Age of Onset   Liver disease Mother    Depression Mother    Liver disease Father    Diabetes Father    Hypertension Father    Liver disease Sister    Diabetes Other    Colon cancer Neg Hx    Esophageal cancer Neg Hx    Stomach cancer Neg Hx    Colon polyps Neg Hx     Social History:  reports that he has been smoking cigars. He has been exposed to tobacco smoke. He has  never used smokeless tobacco. He reports that he does not currently use alcohol. He reports that he does not use drugs.  Allergies:  Allergies  Allergen Reactions   Codeine Other (See Comments)    Jittery     Medications: I have reviewed the patient's current medications.  ROS: Constitutional: No fever or chills Vision: No changes in vision ENT: No difficulty swallowing CV: No chest pain Pulm: No SOB or wheezing GI: No nausea or vomiting GU: No urgency or inability to  hold urine Skin: No poor wound healing Neurologic: No numbness or tingling Psychiatric: No depression or anxiety Heme: No bruising Allergic: No reaction to medications or food   Exam: Blood pressure 130/72, pulse 96, temperature 99.1 F (37.3 C), temperature source Oral, resp. rate 18, SpO2 93%. General: Well-appearing gentleman resting comfortably in bed Orientation: Alert and oriented x 3 Mood and Affect: Mood is calm   Injured Extremity (CV, lymph, sensation, reflexes): Examination of the right leg reveals diffuse swelling with redness and erythema consistent with either cellulitis or possible venous insufficiency.  Open area distally on the lateral incision with mucopurulent drainage seen on the dressing     Medical Decision Making: Data: Imaging: X-rays show a plate and screws affixing a distal tibia and fibular fracture except position with good evidence of bony healing no evidence of bony infection  Labs: White cell count of 13.3, hemoglobin 13.3, hematocrit 41.4.  Platelets 137, INR 1.6  Imaging or Labs ordered: Will order ESR and CRP  Medical history and chart was reviewed and case discussed with medical provider.  Assessment/Plan: Status post open reduction internal fixation with late infection.  The patient does have drainage coming from his left malleolar wound.  Certainly concern for the possibility of a late infection from the lateral incision.  At this point recommend surgical debridement of the wound.  We discussed removing the hardware and possibly the renal to close the wound versus applying a VAC dressing.  We discussed the risks of surge include bleeding failure to resolve infection poor wound healing and need for additional surgeries.  I informed signs obtained we will plan for surgery performed tomorrow.  In the meantime we will order ESR and CRP to get a baseline for his inflammatory markers.  He is already on antibiotics and these will be continued  New  problem w/ workup planned: High complexity diagnosis (Level 5) Surgery w/ risks or Emergency surgery: High complexity Risk (Level 5)  All others are Level 4 with comprehensive musculoskeletal exam.  Cordella Rhein, MD, MS [facility] [phone]

## 2024-03-20 NOTE — Consult Note (Signed)
 Orthopedic Consult  Patient ID: Thomas [name] MRN: [id number] DOB/AGE: [birth year] 48 y.o.  Reason for Consult: Right foot swelling and drainage Referring Physician: Zella  HPI: Thomas [name] is an 48 y.o. male with history of prior right ankle fracture approximately year ago.  This was treated with open reduction internal fixation by one of my partners, Dr. Josefina.  He had done well for several months.  He was walking even dancing on the ankle until approximately 2 months ago and then started noticing repeated episodes of swelling in the ankle.  This initially thought possibly to relate to his psoriasis or a to some other inflammatory condition.  He was treated in urgent care with steroids.  This seemed to help and then this fungal so came back.  This lasted until a few days ago when he noticed drainage coming out of his incision line while in the shower.  He continues to have drainage from the outer part of his ankle with diffuse swelling.  He has had some problems with your retention in the past and does take Lasix  but his right leg is much more swollen than the left.  He has several more medical morbidities including alcoholic cirrhosis and morbid obesity  Past Medical History:  Diagnosis Date   Acute conjunctivitis of both eyes 10/22/2021   Acute sinusitis 06/16/2013   Alcoholic cirrhosis (HCC)    Alcoholic fatty liver 01/16/2010   Needs final HBV and HAV vaccines on or after 10/25/2012    Alcoholism (HCC) 12/25/2011   Allergic rhinitis    Childhood asthma    Elevated transaminase level 06/10/2007   AST: 80 ALT: 136 in 8/11: Hepatitis A., B and C negative.    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    Sleep apnea     Past Surgical History:  Procedure Laterality Date   ESOPHAGEAL BANDING   12/26/2022   Procedure: ESOPHAGEAL BANDING;  Surgeon: Shila Gustav GAILS, MD;  Location: [facility];  Service: Gastroenterology;;   ESOPHAGOGASTRODUODENOSCOPY (EGD) WITH PROPOFOL  N/A 07/05/2022   Procedure: ESOPHAGOGASTRODUODENOSCOPY (EGD) WITH PROPOFOL ;  Surgeon: Shila Gustav GAILS, MD;  Location: [facility];  Service: Gastroenterology;  Laterality: N/A;   ESOPHAGOGASTRODUODENOSCOPY (EGD) WITH PROPOFOL  N/A 12/26/2022   Procedure: ESOPHAGOGASTRODUODENOSCOPY (EGD) WITH PROPOFOL ;  Surgeon: Shila Gustav GAILS, MD;  Location: [facility];  Service: Gastroenterology;  Laterality: N/A;   ESOPHAGOGASTRODUODENOSCOPY (EGD) WITH PROPOFOL  N/A 02/12/2023   Procedure: ESOPHAGOGASTRODUODENOSCOPY (EGD) WITH PROPOFOL ;  Surgeon: Shila Gustav GAILS, MD;  Location: [facility];  Service: Gastroenterology;  Laterality: N/A;   ORIF ANKLE FRACTURE Right 12/28/2022   Procedure: OPEN REDUCTION INTERNAL FIXATION (ORIF) ANKLE FRACTURE;  Surgeon: Josefina Chew, MD;  Location: [facility] OR;  Service: Orthopedics;  Laterality: Right;    Family History  Problem Relation Age of Onset   Liver disease Mother    Depression Mother    Liver disease Father    Diabetes Father    Hypertension Father    Liver disease Sister    Diabetes Other    Colon cancer Neg Hx    Esophageal cancer Neg Hx    Stomach cancer Neg Hx    Colon polyps Neg Hx     Social History:  reports that he has been smoking cigars. He has been exposed to tobacco smoke. He has  never used smokeless tobacco. He reports that he does not currently use alcohol. He reports that he does not use drugs.  Allergies:  Allergies  Allergen Reactions   Codeine Other (See Comments)    Jittery     Medications: I have reviewed the patient's current medications.  ROS: Constitutional: No fever or chills Vision: No changes in vision ENT: No difficulty swallowing CV: No chest pain Pulm: No SOB or wheezing GI: No nausea or vomiting GU: No urgency or inability to  hold urine Skin: No poor wound healing Neurologic: No numbness or tingling Psychiatric: No depression or anxiety Heme: No bruising Allergic: No reaction to medications or food   Exam: Blood pressure 130/72, pulse 96, temperature 99.1 F (37.3 C), temperature source Oral, resp. rate 18, SpO2 93%. General: Well-appearing gentleman resting comfortably in bed Orientation: Alert and oriented x 3 Mood and Affect: Mood is calm   Injured Extremity (CV, lymph, sensation, reflexes): Examination of the right leg reveals diffuse swelling with redness and erythema consistent with either cellulitis or possible venous insufficiency.  Open area distally on the lateral incision with mucopurulent drainage seen on the dressing     Medical Decision Making: Data: Imaging: X-rays show a plate and screws affixing a distal tibia and fibular fracture except position with good evidence of bony healing no evidence of bony infection  Labs: White cell count of 13.3, hemoglobin 13.3, hematocrit 41.4.  Platelets 137, INR 1.6  Imaging or Labs ordered: Will order ESR and CRP  Medical history and chart was reviewed and case discussed with medical provider.  Assessment/Plan: Status post open reduction internal fixation with late infection.  The patient does have drainage coming from his left malleolar wound.  Certainly concern for the possibility of a late infection from the lateral incision.  At this point recommend surgical debridement of the wound.  We discussed removing the hardware and possibly the renal to close the wound versus applying a VAC dressing.  We discussed the risks of surge include bleeding failure to resolve infection poor wound healing and need for additional surgeries.  I informed signs obtained we will plan for surgery performed tomorrow.  In the meantime we will order ESR and CRP to get a baseline for his inflammatory markers.  He is already on antibiotics and these will be continued  New  problem w/ workup planned: High complexity diagnosis (Level 5) Surgery w/ risks or Emergency surgery: High complexity Risk (Level 5)  All others are Level 4 with comprehensive musculoskeletal exam.  Cordella Rhein, MD, MS [facility] [phone]

## 2024-03-20 NOTE — ED Triage Notes (Addendum)
 Pt came in POV from home w/ c/o of R foot pain. Pt has wound near ankle which first appeared on Sunday. Pt stated that leg and foot swelled up until it burst. Redness and pitting edema noted to R leg and foot. Takes Lasix . Pt unable to walk on foot. Endorses SOB. Denies chills, or fever. Bloody drainage noted on dressing.

## 2024-03-20 NOTE — Progress Notes (Signed)
 Right lower extremity venous duplex has been completed.  Results can be found in chart review under CV Proc.  03/20/2024 10:30 AM  Radley Teston Elden Appl, RVT.

## 2024-03-21 ENCOUNTER — Inpatient Hospital Stay (HOSPITAL_COMMUNITY): Admitting: Anesthesiology

## 2024-03-21 ENCOUNTER — Encounter (HOSPITAL_COMMUNITY): Payer: Self-pay | Admitting: Internal Medicine

## 2024-03-21 ENCOUNTER — Inpatient Hospital Stay (HOSPITAL_COMMUNITY)

## 2024-03-21 ENCOUNTER — Encounter (HOSPITAL_COMMUNITY)
Admission: EM | Disposition: A | Discharge status: Home Health | Payer: Self-pay | Source: Home / Self Care | Attending: Hospitalist

## 2024-03-21 DIAGNOSIS — Z6841 Body Mass Index (BMI) 40.0 and over, adult: Secondary | ICD-10-CM

## 2024-03-21 DIAGNOSIS — T847XXA Infection and inflammatory reaction due to other internal orthopedic prosthetic devices, implants and grafts, initial encounter: Secondary | ICD-10-CM | POA: Diagnosis not present

## 2024-03-21 DIAGNOSIS — E66813 Obesity, class 3: Secondary | ICD-10-CM

## 2024-03-21 DIAGNOSIS — L089 Local infection of the skin and subcutaneous tissue, unspecified: Secondary | ICD-10-CM

## 2024-03-21 DIAGNOSIS — G4733 Obstructive sleep apnea (adult) (pediatric): Secondary | ICD-10-CM

## 2024-03-21 DIAGNOSIS — J948 Other specified pleural conditions: Secondary | ICD-10-CM | POA: Insufficient documentation

## 2024-03-21 LAB — CBC
HCT: 36.4 % — ABNORMAL LOW (ref 39.0–52.0)
Hemoglobin: 11.9 g/dL — ABNORMAL LOW (ref 13.0–17.0)
MCH: 32.9 pg (ref 26.0–34.0)
MCHC: 32.7 g/dL (ref 30.0–36.0)
MCV: 100.6 fL — ABNORMAL HIGH (ref 80.0–100.0)
Platelets: 112 K/uL — ABNORMAL LOW (ref 150–400)
RBC: 3.62 MIL/uL — ABNORMAL LOW (ref 4.22–5.81)
RDW: 17.1 % — ABNORMAL HIGH (ref 11.5–15.5)
WBC: 10.5 K/uL (ref 4.0–10.5)
nRBC: 0 % (ref 0.0–0.2)

## 2024-03-21 LAB — COMPREHENSIVE METABOLIC PANEL WITH GFR
ALT: 124 U/L — ABNORMAL HIGH (ref 0–44)
AST: 124 U/L — ABNORMAL HIGH (ref 15–41)
Albumin: 2.3 g/dL — ABNORMAL LOW (ref 3.5–5.0)
Alkaline Phosphatase: 250 U/L — ABNORMAL HIGH (ref 38–126)
Anion gap: 4 — ABNORMAL LOW (ref 5–15)
BUN: 22 mg/dL — ABNORMAL HIGH (ref 6–20)
CO2: 32 mmol/L (ref 22–32)
Calcium: 8.6 mg/dL — ABNORMAL LOW (ref 8.9–10.3)
Chloride: 97 mmol/L — ABNORMAL LOW (ref 98–111)
Creatinine, Ser: 0.9 mg/dL (ref 0.61–1.24)
GFR, Estimated: >60 mL/min (ref >60)
Glucose, Bld: 87 mg/dL (ref 70–99)
Potassium: 4.7 mmol/L (ref 3.5–5.1)
Sodium: 134 mmol/L — ABNORMAL LOW (ref 135–145)
Total Bilirubin: 4.8 mg/dL — ABNORMAL HIGH (ref 0.0–1.2)
Total Protein: 6 g/dL — ABNORMAL LOW (ref 6.5–8.1)

## 2024-03-21 LAB — SURGICAL PCR SCREEN
MRSA, PCR: NEGATIVE
Staphylococcus aureus: NEGATIVE

## 2024-03-21 SURGERY — INCISION AND DRAINAGE OF DEEP ABSCESS, ANKLE
Anesthesia: General | Site: Ankle | Laterality: Right

## 2024-03-21 MED ORDER — ROCURONIUM BROMIDE 10 MG/ML (PF) SYRINGE
PREFILLED_SYRINGE | INTRAVENOUS | Status: AC
Start: 2024-03-21 — End: 2024-03-21
  Filled 2024-03-21: qty 10

## 2024-03-21 MED ORDER — OXYCODONE HCL 5 MG/5ML PO SOLN: 5.0000 mg | Freq: Once | ORAL | Status: DC | PRN

## 2024-03-21 MED ORDER — OXYCODONE HCL 5 MG PO TABS: 5.0000 mg | ORAL_TABLET | Freq: Once | ORAL | Status: DC | PRN

## 2024-03-21 MED ORDER — FENTANYL CITRATE (PF) 250 MCG/5ML IJ SOLN
INTRAMUSCULAR | Status: AC
  Filled 2024-03-21: qty 5

## 2024-03-21 MED ORDER — ONDANSETRON HCL 4 MG/2ML IJ SOLN
INTRAMUSCULAR | Status: AC
  Filled 2024-03-21: qty 2

## 2024-03-21 MED ORDER — ONDANSETRON HCL 4 MG/2ML IJ SOLN
INTRAMUSCULAR | Status: DC | PRN
  Administered 2024-03-21: 4 mg via INTRAVENOUS

## 2024-03-21 MED ORDER — OXYCODONE HCL 5 MG PO TABS: 5.0000 mg | ORAL_TABLET | ORAL | Status: DC | PRN

## 2024-03-21 MED ORDER — LACTATED RINGERS IV SOLN: INTRAVENOUS | Status: DC

## 2024-03-21 MED ORDER — METHOCARBAMOL 1000 MG/10ML IJ SOLN
500.0000 mg | Freq: Three times a day (TID) | INTRAMUSCULAR | Status: DC | PRN
  Administered 2024-03-21 – 2024-03-30 (×10): 500 mg via INTRAVENOUS
  Filled 2024-03-21 (×10): qty 10

## 2024-03-21 MED ORDER — PROPOFOL 10 MG/ML IV BOLUS
INTRAVENOUS | Status: AC
Start: 2024-03-21 — End: 2024-03-21
  Filled 2024-03-21: qty 20

## 2024-03-21 MED ORDER — FENTANYL CITRATE (PF) 100 MCG/2ML IJ SOLN
INTRAMUSCULAR | Status: AC
  Filled 2024-03-21: qty 2

## 2024-03-21 MED ORDER — 0.9 % SODIUM CHLORIDE (POUR BTL) OPTIME
TOPICAL | Status: DC | PRN
  Administered 2024-03-21: 1000 mL

## 2024-03-21 MED ORDER — FENTANYL CITRATE (PF) 100 MCG/2ML IJ SOLN
INTRAMUSCULAR | Status: DC | PRN
  Administered 2024-03-21 (×4): 50 ug via INTRAVENOUS

## 2024-03-21 MED ORDER — ASPIRIN 81 MG PO TBEC: 81.0000 mg | DELAYED_RELEASE_TABLET | Freq: Two times a day (BID) | ORAL | Status: AC

## 2024-03-21 MED ORDER — FENTANYL CITRATE (PF) 50 MCG/ML IJ SOSY
25.0000 ug | PREFILLED_SYRINGE | INTRAMUSCULAR | Status: DC | PRN
  Administered 2024-03-21: 50 ug via INTRAVENOUS

## 2024-03-21 MED ORDER — VANCOMYCIN HCL 1000 MG IV SOLR
INTRAVENOUS | Status: DC | PRN
  Administered 2024-03-21: 1000 mg via TOPICAL

## 2024-03-21 MED ORDER — MIDAZOLAM HCL 2 MG/2ML IJ SOLN
INTRAMUSCULAR | Status: AC
  Filled 2024-03-21: qty 2

## 2024-03-21 MED ORDER — DEXAMETHASONE SOD PHOSPHATE PF 10 MG/ML IJ SOLN
INTRAMUSCULAR | Status: DC | PRN
  Administered 2024-03-21: 4 mg via INTRAVENOUS

## 2024-03-21 MED ORDER — ALBUMIN HUMAN 5 % IV SOLN: INTRAVENOUS | Status: DC | PRN

## 2024-03-21 MED ORDER — CHLORHEXIDINE GLUCONATE 0.12 % MT SOLN
15.0000 mL | Freq: Once | OROMUCOSAL | Status: AC
  Administered 2024-03-21: 15 mL via OROMUCOSAL

## 2024-03-21 MED ORDER — PROPOFOL 10 MG/ML IV BOLUS
INTRAVENOUS | Status: AC
  Filled 2024-03-21: qty 20

## 2024-03-21 MED ORDER — PROPOFOL 10 MG/ML IV BOLUS
INTRAVENOUS | Status: DC | PRN
  Administered 2024-03-21: 30 mg via INTRAVENOUS
  Administered 2024-03-21: 150 mg via INTRAVENOUS

## 2024-03-21 MED ORDER — KETOROLAC TROMETHAMINE 30 MG/ML IJ SOLN
30.0000 mg | Freq: Four times a day (QID) | INTRAMUSCULAR | Status: AC | PRN
  Administered 2024-03-21 – 2024-03-26 (×7): 30 mg via INTRAVENOUS
  Filled 2024-03-21 (×7): qty 1

## 2024-03-21 MED ORDER — OXYCODONE HCL 5 MG PO TABS: 5.0000 mg | ORAL_TABLET | Freq: Four times a day (QID) | ORAL | 0 refills | Status: AC | PRN

## 2024-03-21 MED ORDER — PHENYLEPHRINE 80 MCG/ML (10ML) SYRINGE FOR IV PUSH (FOR BLOOD PRESSURE SUPPORT)
PREFILLED_SYRINGE | INTRAVENOUS | Status: DC | PRN
Start: 2024-03-21 — End: 2024-03-21
  Administered 2024-03-21: 40 ug via INTRAVENOUS
  Administered 2024-03-21 (×3): 80 ug via INTRAVENOUS

## 2024-03-21 MED ORDER — ROCURONIUM BROMIDE 10 MG/ML (PF) SYRINGE
PREFILLED_SYRINGE | INTRAVENOUS | Status: DC | PRN
  Administered 2024-03-21: 100 mg via INTRAVENOUS

## 2024-03-21 MED ORDER — ENOXAPARIN SODIUM 40 MG/0.4ML IJ SOSY
40.0000 mg | PREFILLED_SYRINGE | INTRAMUSCULAR | Status: DC
Start: 2024-03-22 — End: 2024-03-30
  Administered 2024-03-22 – 2024-03-30 (×9): 40 mg via SUBCUTANEOUS
  Filled 2024-03-21 (×9): qty 0.4

## 2024-03-21 MED ORDER — FUROSEMIDE 40 MG PO TABS
40.0000 mg | ORAL_TABLET | Freq: Every day | ORAL | Status: DC
  Administered 2024-03-23 – 2024-03-25 (×3): 40 mg via ORAL
  Filled 2024-03-21 (×3): qty 1

## 2024-03-21 MED ORDER — LIDOCAINE HCL (PF) 2 % IJ SOLN
INTRAMUSCULAR | Status: AC
Start: 2024-03-21 — End: 2024-03-21
  Filled 2024-03-21: qty 5

## 2024-03-21 MED ORDER — MIDAZOLAM HCL 5 MG/5ML IJ SOLN
INTRAMUSCULAR | Status: DC | PRN
  Administered 2024-03-21: 2 mg via INTRAVENOUS

## 2024-03-21 MED ORDER — VANCOMYCIN HCL 1000 MG IV SOLR
INTRAVENOUS | Status: AC
  Filled 2024-03-21: qty 20

## 2024-03-21 MED ORDER — SODIUM CHLORIDE 0.9 % IR SOLN
Status: DC | PRN
  Administered 2024-03-21: 3000 mL

## 2024-03-21 MED ORDER — HYDROGEN PEROXIDE 3 % EX SOLN
CUTANEOUS | Status: AC
  Filled 2024-03-21: qty 473

## 2024-03-21 MED ORDER — LIDOCAINE HCL (PF) 2 % IJ SOLN
INTRAMUSCULAR | Status: AC
  Filled 2024-03-21: qty 5

## 2024-03-21 MED ORDER — FENTANYL CITRATE (PF) 50 MCG/ML IJ SOSY
PREFILLED_SYRINGE | INTRAMUSCULAR | Status: AC
  Filled 2024-03-21: qty 1

## 2024-03-21 MED ORDER — LIDOCAINE 2% (20 MG/ML) 5 ML SYRINGE
INTRAMUSCULAR | Status: DC | PRN
  Administered 2024-03-21: 100 mg via INTRAVENOUS

## 2024-03-21 MED ORDER — SPIRONOLACTONE 25 MG PO TABS
25.0000 mg | ORAL_TABLET | Freq: Every day | ORAL | Status: DC
  Administered 2024-03-23 – 2024-03-25 (×3): 25 mg via ORAL
  Filled 2024-03-21 (×4): qty 1

## 2024-03-21 MED ORDER — SUGAMMADEX SODIUM 200 MG/2ML IV SOLN
INTRAVENOUS | Status: DC | PRN
  Administered 2024-03-21: 200 mg via INTRAVENOUS

## 2024-03-21 MED ORDER — ONDANSETRON HCL 4 MG/2ML IJ SOLN: 4.0000 mg | Freq: Once | INTRAMUSCULAR | Status: DC | PRN

## 2024-03-21 SURGICAL SUPPLY — 39 items
BLADE SURG 15 STRL LF DISP TIS (BLADE) ×4 IMPLANT
CHLORAPREP W/TINT 26 (MISCELLANEOUS) ×2 IMPLANT
CLSR STERI-STRIP ANTIMIC 1/2X4 (GAUZE/BANDAGES/DRESSINGS) ×2 IMPLANT
COVER BACK TABLE 60X90IN (DRAPES) ×2 IMPLANT
COVER MAYO STAND STRL (DRAPES) ×2 IMPLANT
DRAPE INCISE IOBAN 66X45 STRL (DRAPES) IMPLANT
DRAPE LAPAROTOMY T 98X78 PEDS (DRAPES) ×2 IMPLANT
DRSG EMULSION OIL 3X3 NADH (GAUZE/BANDAGES/DRESSINGS) ×2 IMPLANT
ELECT REM PT RETURN 15FT ADLT (MISCELLANEOUS) ×2 IMPLANT
ELECTRODE REM PT RTRN 9FT ADLT (ELECTROSURGICAL) ×2 IMPLANT
GAUZE PAD ABD 8X10 STRL (GAUZE/BANDAGES/DRESSINGS) IMPLANT
GAUZE SPONGE 4X4 12PLY STRL (GAUZE/BANDAGES/DRESSINGS) ×2 IMPLANT
GAUZE XEROFORM 1X8 LF (GAUZE/BANDAGES/DRESSINGS) IMPLANT
GLOVE BIO SURGEON STRL SZ 6 (GLOVE) ×2 IMPLANT
GLOVE BIOGEL PI IND STRL 6.5 (GLOVE) ×2 IMPLANT
GLOVE BIOGEL PI IND STRL 8 (GLOVE) ×2 IMPLANT
GLOVE SURG ORTHO 8.0 STRL STRW (GLOVE) ×2 IMPLANT
GOWN STRL REUS W/ TWL XL LVL3 (GOWN DISPOSABLE) ×4 IMPLANT
KIT BASIN OR (CUSTOM PROCEDURE TRAY) ×2 IMPLANT
KIT TURNOVER KIT A (KITS) ×2 IMPLANT
NDL HYPO 22X1.5 SAFETY MO (MISCELLANEOUS) IMPLANT
NEEDLE HYPO 22X1.5 SAFETY MO (MISCELLANEOUS) IMPLANT
NS IRRIG 1000ML POUR BTL (IV SOLUTION) ×2 IMPLANT
PAD CAST 3X4 CTTN HI CHSV (CAST SUPPLIES) IMPLANT
PADDING CAST COTTON 6X4 STRL (CAST SUPPLIES) IMPLANT
SPIKE FLUID TRANSFER (MISCELLANEOUS) IMPLANT
SPLINT PLASTER CAST FAST 5X30 (CAST SUPPLIES) IMPLANT
SPONGE T-LAP 4X18 ~~LOC~~+RFID (SPONGE) ×2 IMPLANT
SUCTION TUBE FRAZIER 10FR DISP (SUCTIONS) IMPLANT
SUT MON AB 2-0 CT1 36 (SUTURE) ×2 IMPLANT
SUT MON AB 4-0 PC3 18 (SUTURE) ×2 IMPLANT
SUT VIC AB 2-0 SH 27XBRD (SUTURE) IMPLANT
SUT VICRYL 0 SH 27 (SUTURE) IMPLANT
SYR BULB EAR ULCER 3OZ GRN STR (SYRINGE) ×2 IMPLANT
SYR CONTROL 10ML LL (SYRINGE) IMPLANT
TOWEL GREEN STERILE FF (TOWEL DISPOSABLE) ×4 IMPLANT
TUBING CONNECTING 10 (TUBING) ×2 IMPLANT
UNDERPAD 30X36 HEAVY ABSORB (UNDERPADS AND DIAPERS) ×2 IMPLANT
YANKAUER SUCT BULB TIP NO VENT (SUCTIONS) ×2 IMPLANT

## 2024-03-21 NOTE — Discharge Instructions (Addendum)
 Orthopedic Discharge Instructions  Diet: As you were doing prior to hospitalization   Shower:  May shower but keep the wounds dry, use an occlusive plastic wrap, NO SOAKING IN TUB.  If the bandage gets wet, change with a clean dry gauze.  If you have a splint on, leave the splint in place and keep the splint dry with a plastic bag.  Dressing:  You may change your dressing 3-5 days after surgery, unless you have a splint.  If the dressing remains clean and dry it can also be left on until follow up.  If you change the dressing replace with clean gauze and tape or ace wrap.  If you have a splint, then just leave the splint in place and we will change your bandages during your first follow-up appointment.  If water gets in the splint or the splint gets saturated please call the clinic and we can see you to change your splint.  For most surgeries, the stitches used are dissolvable and don't need to be removed.  However, depending on your surgery, you may have stitches that will need to be removed in the office in about 2 weeks.    Activity:  Increase activity slowly as tolerated, but follow the weight bearing instructions below.  Do not drive for the next 4-6 weeks.  In addition, you cannot be taking narcotics while you drive, and you must feel in control of the vehicle.    Weight Bearing:   Do not bear weight on your surgical leg until cleared by your physician.    Blood clot prevention (DVT Prophylaxis): After surgery you are at an increased risk for a blood clot. you were prescribed a blood thinner, Aspirin 81mg  , to be taken once daily for a total of 4 weeks from surgery to help reduce your risk of getting a blood clot. Signs of a pulmonary embolus (blood clot in the lungs) include sudden short of breath, feeling lightheaded or dizzy, chest pain with a deep breath, rapid pulse rapid breathing.  Signs of a blood clot in your arms or legs include new unexplained swelling and cramping, warm, red or  darkened skin around the painful area.  Please call the office or 911 right away if these signs or symptoms develop.  To prevent constipation: you may use a stool softener such as - Colace (over the counter) 100 mg by mouth twice a day  Drink plenty of fluids (prune juice may be helpful) and high fiber foods Miralax  (over the counter) for constipation as needed.    Itching:  If you experience itching with your medications, try taking only a single pain pill, or even half a pain pill at a time.  You may take up to 10 pain pills per day, and you can also use benadryl  over the counter for itching or also to help with sleep.   Precautions:  If you experience chest pain or shortness of breath - call 911 immediately for transfer to the hospital emergency department!!  Call office [phone]) for the following: Temperature greater than 101F Persistent nausea and vomiting Severe uncontrolled pain Redness, tenderness, or signs of infection (pain, swelling, redness, odor or green/yellow discharge around the site) Difficulty breathing, headache or visual disturbances Hives Persistent dizziness or light-headedness Extreme fatigue Any other questions or concerns you may have after discharge  In an emergency, call 911 or go to an Emergency Department at a nearby hospital  Follow- Up Appointment:  Please call for an appointment to  be seen approximately 2-3 week after surgery in [facility] with your surgeon Dr. Toribio Higashi - [phone] Address: [address], [place], [place] [address]

## 2024-03-21 NOTE — Anesthesia Procedure Notes (Signed)
 Procedure Name: Intubation Date/Time: 03/21/2024 1:42 PM  Performed by: Kamaury Cutbirth D, CRNAPre-anesthesia Checklist: Patient identified, Emergency Drugs available, Suction available and Patient being monitored Patient Re-evaluated:Patient Re-evaluated prior to induction Oxygen Delivery Method: Circle system utilized Preoxygenation: Pre-oxygenation with 100% oxygen Induction Type: IV induction Ventilation: Mask ventilation without difficulty Laryngoscope Size: Mac and 4 Grade View: Grade II Tube type: Oral Number of attempts: 1 Airway Equipment and Method: Stylet and Oral airway Placement Confirmation: ETT inserted through vocal cords under direct vision, positive ETCO2 and breath sounds checked- equal and bilateral Secured at: 23 cm Tube secured with: Tape Dental Injury: Teeth and Oropharynx as per pre-operative assessment

## 2024-03-21 NOTE — Plan of Care (Signed)
 ?  Problem: Clinical Measurements: ?Goal: Ability to maintain clinical measurements within normal limits will improve ?Outcome: Progressing ?Goal: Will remain free from infection ?Outcome: Progressing ?Goal: Diagnostic test results will improve ?Outcome: Progressing ?  ?

## 2024-03-21 NOTE — Transfer of Care (Signed)
 Immediate Anesthesia Transfer of Care Note  Patient: Thomas [name]  Procedure(s) Performed: INCISION AND DRAINAGE OF DEEP ABSCESS, ANKLE (Right: Ankle)  Patient Location: PACU  Anesthesia Type:General  Level of Consciousness: awake, alert , and oriented  Airway & Oxygen Therapy: Patient Spontanous Breathing and Patient connected to face mask oxygen  Post-op Assessment: Report given to RN and Post -op Vital signs reviewed and stable  Post vital signs: Reviewed and stable  Last Vitals:  Vitals Value Taken Time  BP 100/89 03/21/24 15:10  Temp 36.7 C 03/21/24 15:10  Pulse 86 03/21/24 15:12  Resp 15 03/21/24 15:12  SpO2 95 % 03/21/24 15:12  Vitals shown include unfiled device data.  Last Pain:  Vitals:   03/21/24 1210  TempSrc:   PainSc: 0-No pain      Patients Stated Pain Goal: 2 (03/21/24 0727)  Complications: No notable events documented.

## 2024-03-21 NOTE — Progress Notes (Signed)
 [facility] PROGRESS NOTE    Progress Note  Thomas [name]  FMW:[id number] DOB: 1976/05/27 DOA: 03/20/2024 PCP: Thomas Elsie Sayre, MD     Brief Narrative:   Thomas [name] is an 48 y.o. male past medical history significant for psoriatic arthritis obesity and alcoholic cirrhosis, history of right ankle fracture with hardware and surgical repair in July 2024, recently discharged from the hospital in September 2025 for hepatic hydrothorax underwent thoracocentesis at that time who comes in right ankle swelling and pain likely due to cellulitis in the setting of surgical hardware.   Assessment/Plan:   Right ankle cellulitis with  Wound infection complicating hardware, initial encounter Was recently on a steroid taper. Due to concerns of skin and hardware infection he was started empirically on IV vancomycin and Rocephin . Blood cultures are pending. Orthopedic surgery was consulted he was placed n.p.o. as anticipate OR washout in the morning. Continue narcotics for pain control. CRP is 4.6 ESR is 39.  Alcoholic cirrhosis of liver (HCC) with esophageal varices and portal hypertension: He has been counseled about alcohol cessation. He is currently on, nadolol  Protonix  and lactulose.  Hold Aldactone  and Lasix  for 24 hours can be resumed on the 15th.  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 IR was consulted perform thoracocentesis and yielded 2.2 L of hazy fluid. Repeat a chest x-ray showed improved but persistent right pleural effusion.  Psoriatic arthritis: Steroids have been discontinued.  Class 3 severe obesity due to excess calories with body mass index (BMI) of 50.0 to 59.9 in adult  Counseling.  Thrombocytopenia Likely due to alcohol abuse try to keep platelet count greater than 50,000.     DVT prophylaxis: lovenoxno Family Communication:none Status is: Inpatient Remains inpatient appropriate because: Right ankle cellulitis    Code  Status:     Code Status Orders  (From admission, onward)           Start     Ordered   03/20/24 1148  Full code  Continuous       Question:  By:  Answer:  Consent: discussion documented in EHR   03/20/24 1149           Code Status History     Date Active Date Inactive Code Status Order ID Comments User Context   02/20/2024 2312 02/24/2024 2132 Full Code [id number]  Thomas Carrier, MD ED   01/07/2023 1611 01/13/2023 0251 Full Code [id number]  Thomas Sharlet GORMAN, PA-C Inpatient   12/25/2022 1753 01/07/2023 1522 Full Code [id number]  Thomas Marsa NOVAK, MD ED   07/05/2022 0243 07/06/2022 1713 Full Code [id number]  Opyd, Evalene GORMAN, MD ED         IV Access:   Peripheral IV   Procedures and diagnostic studies:   US  THORACENTESIS ASP PLEURAL SPACE W/IMG GUIDE Result Date: 03/20/2024 INDICATION: 142230 Pleural effusion 142230 Patient with history of alcoholic cirrhosis, portal hypertension, splenomegaly, ascites, obesity, recurrent hepatic hydrothorax/right pleural effusion, dyspnea ; request received for therapeutic right thoracentesis EXAM: ULTRASOUND GUIDED THERAPEUTIC RIGHT THORACENTESIS MEDICATIONS: 8 mL 1% lidocaine  with epinephrine  to skin/subcutaneous tissue COMPLICATIONS: None immediate. PROCEDURE: An ultrasound guided thoracentesis was thoroughly discussed with the patient and questions answered. The benefits, risks, alternatives and complications were also discussed. The patient understands and wishes to proceed with the procedure. Written consent was obtained. Ultrasound was performed to localize and mark an adequate pocket of fluid in the right chest. The area was then prepped and draped in the normal sterile fashion. 1% Lidocaine   was used for local anesthesia. Under ultrasound guidance a 6 Fr Safe-T-Centesis catheter was introduced. Thoracentesis was performed. The catheter was removed and a dressing applied. FINDINGS: A total of approximately 2.2 L of slightly hazy, yellow fluid  was removed. IMPRESSION: Successful ultrasound guided therapeutic RIGHT thoracentesis yielding 2.2 L of pleural fluid. Performed by: Thomas Rakers, PA-C PLAN: The patient has required >/=2 thoracenteses (for hepatic hydrothorax) in a 30 day period and a formal evaluation by the [facility] has been arranged. Thomas Hall, MD [facility] Radiology Electronically Signed   By: Thomas [name] M.D.   On: 03/20/2024 19:31   DG Chest 1 View Result Date: 03/20/2024 CLINICAL DATA:  [id number] S/P thoracentesis [id number] EXAM: CHEST  1 VIEW COMPARISON:  Chest x-ray 03/20/2024 7:59 a.m. FINDINGS: The heart and mediastinal contours are within normal limits. No focal consolidation. No pulmonary edema. Improved but persistent at least moderate right pleural effusion. No pneumothorax. No acute osseous abnormality. IMPRESSION: Improved but persistent at least moderate right pleural effusion. Electronically Signed   By: Thomas  [name] M.D.   On: 03/20/2024 13:53   VAS US  LOWER EXTREMITY VENOUS (DVT) (7a-7p) Result Date: 03/20/2024  Lower Venous DVT Study [name]  Date of Exam:   03/20/2024 Medical Rec #: [id number]       Accession #:    [id number] Date of Birth: [birth year]        Patient Gender: M Patient Age:   4 years Exam Location:  [facility] Procedure:      VAS US  LOWER EXTREMITY VENOUS (DVT) Referring Phys: WARREN BARRETT --------------------------------------------------------------------------------  Indications: Swelling, Edema, SOB, ulceration, and Pain.  Comparison Study: Previous study on 10.1.2025. Performing Technologist: Edilia Elden Appl  Examination Guidelines: A complete evaluation includes B-mode imaging, spectral Doppler, color Doppler, and power Doppler as needed of all accessible portions of each vessel. Bilateral testing is considered an integral part of a complete examination.  Limited examinations for reoccurring indications may be performed as noted. The reflux portion of the exam is performed with the patient in reverse Trendelenburg.  +---------+---------------+---------+-----------+----------+--------------+ RIGHT    CompressibilityPhasicitySpontaneityPropertiesThrombus Aging +---------+---------------+---------+-----------+----------+--------------+ CFV      Full           Yes      Yes                                 +---------+---------------+---------+-----------+----------+--------------+ SFJ      Full           Yes      Yes                                 +---------+---------------+---------+-----------+----------+--------------+ FV Prox  Full                                                        +---------+---------------+---------+-----------+----------+--------------+ FV Mid   Full                                                        +---------+---------------+---------+-----------+----------+--------------+  FV DistalFull                                                        +---------+---------------+---------+-----------+----------+--------------+ PFV      Full                                                        +---------+---------------+---------+-----------+----------+--------------+ POP      Full           Yes      Yes                                 +---------+---------------+---------+-----------+----------+--------------+ PTV      Full                                                        +---------+---------------+---------+-----------+----------+--------------+ PERO     Full                                                        +---------+---------------+---------+-----------+----------+--------------+   +----+---------------+---------+-----------+----------+--------------+ LEFTCompressibilityPhasicitySpontaneityPropertiesThrombus Aging  +----+---------------+---------+-----------+----------+--------------+ CFV Full           Yes      Yes                                 +----+---------------+---------+-----------+----------+--------------+ SFJ Full           Yes      Yes                                 +----+---------------+---------+-----------+----------+--------------+     Summary: RIGHT: - There is no evidence of deep vein thrombosis in the lower extremity.  - No cystic structure found in the popliteal fossa.  LEFT: - No evidence of common femoral vein obstruction.   *See table(s) above for measurements and observations. Electronically signed by Gaile New MD on 03/20/2024 at 11:55:49 AM.    Final    DG Chest Port 1 View Result Date: 03/20/2024 EXAM: 1 VIEW(S) XRAY OF THE CHEST 03/20/2024 08:10:00 AM COMPARISON: 03/09/2024 CLINICAL HISTORY: SOB (shortness of breath) 141880. SOB and right lateral ankle wound with swelling. FINDINGS: LUNGS AND PLEURA: Large right pleural effusion, increased from prior exam. Hazy opacity throughout right lung may represent atelectasis, though airspace disease cannot be excluded. No pulmonary edema. No pneumothorax. HEART AND MEDIASTINUM: No acute abnormality of the cardiac and mediastinal silhouettes. BONES AND SOFT TISSUES: No acute osseous abnormality. IMPRESSION: 1. Large right pleural effusion, increased from prior exam. 2. Hazy opacity throughout the right lung that may represent atelectasis; superimposed airspace disease cannot be excluded. Electronically signed by: Waddell Calk MD 03/20/2024  08:20 AM EDT RP Workstation: [id number]   DG Ankle Complete Right Result Date: 03/20/2024 EXAM: 3 OR MORE VIEW(S) XRAY OF THE RIGHT ANKLE 03/20/2024 08:10:00 AM CLINICAL HISTORY: lateral ankle wound. SOB and right lateral ankle wound with swelling. COMPARISON: 03/13/2024 FINDINGS: BONES AND JOINTS: Status post ORIF of lateral and medial malleolus. Intact orthopedic hardware without  periprosthetic fracture or lucency. No focal osseous lesion. No joint dislocation. Small ankle joint effusion. SOFT TISSUES: Focus of soft tissue gas about the lateral malleolus. Interval progression of diffuse soft tissue edema about the ankle and foot with new marked dorsal soft tissue swelling. IMPRESSION: 1. Interval progression of diffuse soft tissue edema about the ankle and foot with new marked dorsal soft tissue swelling and small ankle joint effusion. 2. New foci of soft tissue gas about the lateral malleolus. 3. Status post ORIF of lateral and medial malleolus with intact orthopedic hardware. No radiographic signs of acute osteomyelitis though the presence of soft tissue gas is concerning for an underlying infection Electronically signed by: Waddell Calk MD 03/20/2024 08:20 AM EDT RP Workstation: [id number]     Medical Consultants:   None.   Subjective:    Thomas [name] relate his pain is well-controlled.  Objective:    Vitals:   03/20/24 1736 03/20/24 2102 03/21/24 0109 03/21/24 0537  BP: 130/72 135/73 (!) 143/83 134/65  Pulse: 96 87 88 74  Resp: 18 20 16 15   Temp: 99.1 F (37.3 C) 99.2 F (37.3 C) 100.1 F (37.8 C) 98.6 F (37 C)  TempSrc: Oral Oral Oral Oral  SpO2: 93% 97% 95% 92%   SpO2: 92 %   Intake/Output Summary (Last 24 hours) at 03/21/2024 9371 Last data filed at 03/21/2024 0544 Gross per 24 hour  Intake 1460 ml  Output 800 ml  Net 660 ml   There were no vitals filed for this visit.  Exam: General exam: In no acute distress. Respiratory system: Good air movement and clear to auscultation. Cardiovascular system: S1 & S2 heard, RRR. No JVD. Gastrointestinal system: Abdomen is nondistended, soft and nontender.  Extremities: No pedal edema. Skin:  Psychiatry: Judgement and insight appear normal. Mood & affect appropriate.    Data Reviewed:    Labs: Basic Metabolic Panel: Recent Labs  Lab 03/20/24 0730 03/21/24 0427  NA 132* 134*  K 4.3  4.7  CL 96* 97*  CO2 29 32  GLUCOSE 90 87  BUN 22* 22*  CREATININE 0.68 0.90  CALCIUM 9.0 8.6*   GFR Estimated Creatinine Clearance: 123 mL/min (by C-G formula based on SCr of 0.9 mg/dL). Liver Function Tests: Recent Labs  Lab 03/20/24 0730 03/21/24 0427  AST 117* 124*  ALT 123* 124*  ALKPHOS 264* 250*  BILITOT 4.6* 4.8*  PROT 6.9 6.0*  ALBUMIN  2.5* 2.3*   No results for input(s): LIPASE, AMYLASE in the last 168 hours. Recent Labs  Lab 03/20/24 1407  AMMONIA 90*   Coagulation profile Recent Labs  Lab 03/20/24 0732  INR 1.6*   COVID-19 Labs  Recent Labs    03/20/24 1835  CRP 4.6*    Lab Results  Component Value Date   SARSCOV2NAA NEGATIVE 03/09/2024   SARSCOV2NAA NEGATIVE 12/25/2022    CBC: Recent Labs  Lab 03/20/24 0600 03/21/24 0427  WBC 13.3* 10.5  NEUTROABS 9.9*  --   HGB 13.3 11.9*  HCT 41.4 36.4*  MCV 99.8 100.6*  PLT 137* 112*   Cardiac Enzymes: No results for input(s): CKTOTAL, CKMB, CKMBINDEX, TROPONINI in the last  168 hours. BNP (last 3 results) No results for input(s): PROBNP in the last 8760 hours. CBG: No results for input(s): GLUCAP in the last 168 hours. D-Dimer: No results for input(s): DDIMER in the last 72 hours. Hgb A1c: No results for input(s): HGBA1C in the last 72 hours. Lipid Profile: No results for input(s): CHOL, HDL, LDLCALC, TRIG, CHOLHDL, LDLDIRECT in the last 72 hours. Thyroid  function studies: No results for input(s): TSH, T4TOTAL, T3FREE, THYROIDAB in the last 72 hours.  Invalid input(s): FREET3 Anemia work up: No results for input(s): VITAMINB12, FOLATE, FERRITIN, TIBC, IRON, RETICCTPCT in the last 72 hours. Sepsis Labs: Recent Labs  Lab 03/20/24 0545 03/20/24 0600 03/21/24 0427  WBC  --  13.3* 10.5  LATICACIDVEN 1.7  --   --    Microbiology No results found for this or any previous visit (from the past 240 hours).   Medications:     furosemide   40 mg Oral Daily   lactulose  30 g Oral TID   loteprednol  1 drop Both Eyes QID   nadolol   20 mg Oral Daily   pantoprazole   40 mg Oral BID   spironolactone   25 mg Oral Daily   Continuous Infusions:  cefTRIAXone  (ROCEPHIN )  IV     vancomycin        LOS: 1 day   Thomas [name]  [facility]  03/21/2024, 6:28 AM

## 2024-03-21 NOTE — Interval H&P Note (Signed)
 The patient has been re-examined, and the chart reviewed, and there have been no interval changes to the documented history and physical.    Plan for Right ankle hardware removal and irrigation and debridement  The operative side was examined and the patient was confirmed to have sensation to DPN, SPN, TN intact, Motor EHL, ext, flex 5/5, and DP 2+, PT 2+, No significant edema.   The risks, benefits, and alternatives have been discussed at length with patient, and the patient is willing to proceed.  Right ankle marked. Consent has been signed.

## 2024-03-21 NOTE — Plan of Care (Signed)
  Problem: Activity: Goal: Risk for activity intolerance will decrease Outcome: Progressing   Problem: Nutrition: Goal: Adequate nutrition will be maintained Outcome: Progressing   Problem: Safety: Goal: Ability to remain free from injury will improve Outcome: Progressing   Problem: Skin Integrity: Goal: Risk for impaired skin integrity will decrease Outcome: Progressing   Problem: Coping: Goal: Level of anxiety will decrease Outcome: Not Progressing

## 2024-03-21 NOTE — Progress Notes (Signed)
 Drinks taken from bedside. Patient aware to be NPO after midnight for procedure in am. Plan of care ongoing.

## 2024-03-21 NOTE — Anesthesia Preprocedure Evaluation (Addendum)
 Anesthesia Evaluation  Patient identified by MRN, date of birth, ID band Patient awake    Reviewed: Allergy & Precautions, NPO status , Patient's Chart, lab work & pertinent test results  History of Anesthesia Complications Negative for: history of anesthetic complications  Airway Mallampati: II       Dental  (+) Teeth Intact, Dental Advisory Given   Pulmonary sleep apnea , Current Smoker   breath sounds clear to auscultation       Cardiovascular  Rhythm:Regular Rate:Normal   1. Left ventricular ejection fraction, by estimation, is 55 to 60%. Left  ventricular ejection fraction by 3D volume is 57 %. The left ventricle has  normal function. The left ventricle has no regional wall motion  abnormalities. Left ventricular diastolic   parameters were normal.   2. Right ventricular systolic function is normal. The right ventricular  size is mildly enlarged. There is normal pulmonary artery systolic  pressure. The estimated right ventricular systolic pressure is 29.8 mmHg.   3. The mitral valve is grossly normal. Trivial mitral valve  regurgitation. No evidence of mitral stenosis.   4. The aortic valve is tricuspid. Aortic valve regurgitation is not  visualized. No aortic stenosis is present.   5. The inferior vena cava is normal in size with greater than 50%  respiratory variability, suggesting right atrial pressure of 3 mmHg.      Neuro/Psych    GI/Hepatic ,,,(+) Cirrhosis   Esophageal Varices and ascites  substance abuse  alcohol use  Endo/Other    Renal/GU      Musculoskeletal   Abdominal   Peds  Hematology  (+) Blood dyscrasia, anemia Thrombocytopenia   Anesthesia Other Findings   Reproductive/Obstetrics                              Anesthesia Physical Anesthesia Plan  ASA: 3  Anesthesia Plan: General   Post-op Pain Management:    Induction: Intravenous and Rapid  sequence  PONV Risk Score and Plan: 2 and Treatment may vary due to age or medical condition, Ondansetron  and Dexamethasone   Airway Management Planned: Oral ETT  Additional Equipment:   Intra-op Plan:   Post-operative Plan: Extubation in OR  Informed Consent:      Dental advisory given  Plan Discussed with: Surgeon and CRNA  Anesthesia Plan Comments:          Anesthesia Quick Evaluation

## 2024-03-21 NOTE — Op Note (Addendum)
 03/20/2024 - 03/21/2024  PATIENT:  Thomas [name]    PRE-OPERATIVE DIAGNOSIS: Infection in right ankle with history of prior ankle fracture repair  POST-OPERATIVE DIAGNOSIS:  Same  PROCEDURE: Irrigation debridement of right ankle infection with hardware removal and wound revision  SURGEON:  Tannya Gonet A Gabbriella Presswood, MD  PHYSICIAN ASSISTANT: Thomas Mclean, PA-C, present and scrubbed throughout the case, critical for completion in a timely fashion, and for retraction, instrumentation, and closure.  ANESTHESIA:   General  ESTIMATED BLOOD LOSS: 50cc  PREOPERATIVE INDICATIONS:  Thomas [name] is a  48 y.o. male who had sustained a right ankle fracture back in July of last year that was repaired by Dr. Josefina.  He had done well after surgery but started to have worsening issues in the last couple of days noting drainage coming out of the lateral incision with skin breakdown.  He presented to the emergency room given concern for infection and was started on broad-spectrum antibiotics.  Given the presence of a draining sinus elected to take him to the OR for wound debridement and hardware removal.  Understands that there is a risk of continued wound complications as well as recurrence of infection that could necessitate return to the OR for further debridement surgery.  The risks benefits and alternatives were discussed with the patient preoperatively including but not limited to the risks of infection, bleeding, nerve injury, cardiopulmonary complications, the need for revision surgery, the need for hardware removal, among others, and the patient was willing to proceed.  OPERATIVE PROCEDURE: The patient was brought to the operating room and placed in the supine position. All bony prominences were padded.  Patient is on broad-spectrum antibiotics with vancomycin and Rocephin .  Non sterile Tourniquet was placed but not inflated. General anesthesia was administered. The lower extremity was prepped and  draped in the usual sterile fashion.  Time out was performed.   Incision was made over the distal fibula utilizing the patient's old scar.  The plate and screws were exposed.  Necrotic tissue was debrided.  The screws were able to be successfully removed without any issues.  All the screws were out were able to pull out the plate.  Notably in the distal fibula there was a defect in the lateral fibula that was distal to the old fracture with concern for purulent tissue.  The tissue from this fibular defect was collected and sent for aerobic anaerobic culture.  This area was debrided with a curette.  Notably the fibula was still intact. The wound was then irrigated with 3% hydrogen peroxide.  Normal saline pulse lavage 2 L were also utilized.  We felt we had adequate debridement vancomycin powder was placed into the wound.  Fluoroscopy confirmed appropriate removal of hardware.  Fluoroscopic images were obtained to confirm all desired hardware was removed.  The wounds were closed with 2-0 Monocryl and 3-0 nylon closure for the skin. Adaptic and Sterile gauze was applied followed by a posterior splint. He was awakened and returned to the PACU in stable and satisfactory condition. There were no complications.   Debridement type: Excisional Debridement   Side: Right   Body Location: Lateral Ankle   Tools used for debridement: scalpel, scissors, cobb, currette, and rongeur   Pre-debridement Wound size (cm):   Length: 2        Width: 2     Depth: 2    Post-debridement Wound size (cm):   Length: 2        Width: 2  Depth: 2   Debridement depth beyond dead/damaged tissue down to healthy viable tissue: yes   Tissue layer involved: skin, subcutaneous tissue, muscle, bone   Nature of tissue removed: Devitalized Tissue   Irrigation volume: 2L       Irrigation fluid type: Normal Saline and 3% hydrogen peroxide.  Post op recs: WB: NWB RLE in splint Antibiotics: Continue on broad-spectrum  antibiotics, broad-spectrum antibiotics, antibiotic plan per infectious disease. Imaging: PACU xrays Dressing: keep splint intact until follow up DVT prophylaxis: Lovenox  in house, discharged with aspirin 81mg  BID x4 weeks Follow up: 2 weeks after surgery for a wound check and suture removal with Thomas [name] at [facility].  Address: [address], [place], [place] [address]  Office Phone: [phone]  Thomas Edna, MD Orthopaedic Surgery

## 2024-03-22 ENCOUNTER — Inpatient Hospital Stay (HOSPITAL_COMMUNITY)

## 2024-03-22 ENCOUNTER — Encounter (HOSPITAL_COMMUNITY): Payer: Self-pay | Admitting: Orthopedic Surgery

## 2024-03-22 ENCOUNTER — Other Ambulatory Visit: Payer: Self-pay

## 2024-03-22 DIAGNOSIS — K7030 Alcoholic cirrhosis of liver without ascites: Secondary | ICD-10-CM

## 2024-03-22 DIAGNOSIS — R7401 Elevation of levels of liver transaminase levels: Secondary | ICD-10-CM

## 2024-03-22 DIAGNOSIS — M86671 Other chronic osteomyelitis, right ankle and foot: Secondary | ICD-10-CM

## 2024-03-22 DIAGNOSIS — Z452 Encounter for adjustment and management of vascular access device: Secondary | ICD-10-CM | POA: Diagnosis not present

## 2024-03-22 DIAGNOSIS — T847XXA Infection and inflammatory reaction due to other internal orthopedic prosthetic devices, implants and grafts, initial encounter: Secondary | ICD-10-CM | POA: Diagnosis not present

## 2024-03-22 DIAGNOSIS — J90 Pleural effusion, not elsewhere classified: Secondary | ICD-10-CM | POA: Diagnosis not present

## 2024-03-22 DIAGNOSIS — R918 Other nonspecific abnormal finding of lung field: Secondary | ICD-10-CM | POA: Diagnosis not present

## 2024-03-22 LAB — COMPREHENSIVE METABOLIC PANEL WITH GFR
ALT: 104 U/L — ABNORMAL HIGH (ref 0–44)
AST: 78 U/L — ABNORMAL HIGH (ref 15–41)
Albumin: 2.5 g/dL — ABNORMAL LOW (ref 3.5–5.0)
Alkaline Phosphatase: 237 U/L — ABNORMAL HIGH (ref 38–126)
Anion gap: 7 (ref 5–15)
BUN: 28 mg/dL — ABNORMAL HIGH (ref 6–20)
CO2: 29 mmol/L (ref 22–32)
Calcium: 8.7 mg/dL — ABNORMAL LOW (ref 8.9–10.3)
Chloride: 95 mmol/L — ABNORMAL LOW (ref 98–111)
Creatinine, Ser: 0.98 mg/dL (ref 0.61–1.24)
GFR, Estimated: >60 mL/min (ref >60)
Glucose, Bld: 127 mg/dL — ABNORMAL HIGH (ref 70–99)
Potassium: 4.3 mmol/L (ref 3.5–5.1)
Sodium: 131 mmol/L — ABNORMAL LOW (ref 135–145)
Total Bilirubin: 3.9 mg/dL — ABNORMAL HIGH (ref 0.0–1.2)
Total Protein: 6.6 g/dL (ref 6.5–8.1)

## 2024-03-22 MED ORDER — POLYVINYL ALCOHOL 1.4 % OP SOLN
1.0000 [drp] | OPHTHALMIC | Status: DC | PRN
  Administered 2024-03-22: 1 [drp] via OPHTHALMIC
  Filled 2024-03-22: qty 15

## 2024-03-22 MED ORDER — VANCOMYCIN HCL IN DEXTROSE 1-5 GM/200ML-% IV SOLN
1000.0000 mg | Freq: Two times a day (BID) | INTRAVENOUS | Status: DC
  Administered 2024-03-22 – 2024-03-24 (×4): 1000 mg via INTRAVENOUS
  Filled 2024-03-22 (×4): qty 200

## 2024-03-22 MED ORDER — SODIUM CHLORIDE 0.9 % IV SOLN
2.0000 g | INTRAVENOUS | Status: DC
  Administered 2024-03-23: 2 g via INTRAVENOUS

## 2024-03-22 MED ORDER — SODIUM CHLORIDE 0.9% FLUSH
10.0000 mL | Freq: Two times a day (BID) | INTRAVENOUS | Status: DC
  Administered 2024-03-23 – 2024-03-30 (×6): 10 mL

## 2024-03-22 MED ORDER — OXYCODONE HCL 5 MG PO TABS
5.0000 mg | ORAL_TABLET | ORAL | Status: DC | PRN
  Administered 2024-03-27: 5 mg via ORAL
  Filled 2024-03-22: qty 1

## 2024-03-22 MED ORDER — HYDROMORPHONE HCL 2 MG PO TABS: 2.0000 mg | ORAL_TABLET | ORAL | Status: DC | PRN

## 2024-03-22 MED ORDER — CHLORHEXIDINE GLUCONATE CLOTH 2 % EX PADS
6.0000 | MEDICATED_PAD | Freq: Every day | CUTANEOUS | Status: DC
  Administered 2024-03-23 – 2024-03-30 (×8): 6 via TOPICAL

## 2024-03-22 MED ORDER — SODIUM CHLORIDE 0.9% FLUSH: 10.0000 mL | INTRAVENOUS | Status: DC | PRN

## 2024-03-22 NOTE — Progress Notes (Addendum)
 Pharmacy Antibiotic Note  Thomas [name] is a 48 y.o. male admitted on 03/20/2024 with cellulitis/ankle infection.  Pharmacy has been consulted for Vanco dosing.  Active Problem(s): Right foot swelling and drainage at the site of bimalleolar right ankle fracture which was surgically repaired with hardware July 2024.  ID: R foot/ankle infection Afebrile, WBC wnl, Scr now 0.98  Vanco 10/12>> Rocephin  10/12>>  Plan: S/p debridement 10/13 Rocephin  1g IV q24h Decrease Vancomycin to Vancomycin 1g q12 due to change in Scr (Scr 0.98, Vd 0.5, eAUC 461) Bcx pending  Continue to monitor renal function, bcx results, and opportunites to de-escalate antibiotics as appropriate    Height: 5' 5 (165.1 cm) Weight: 119.7 kg (264 lb) IBW/kg (Calculated) : 61.5  Temp (24hrs), Avg:97.9 F (36.6 C), Min:97.7 F (36.5 C), Max:98.6 F (37 C)  Recent Labs  Lab 03/20/24 0545 03/20/24 0600 03/20/24 0730 03/21/24 0427 03/22/24 0826  WBC  --  13.3*  --  10.5  --   CREATININE  --   --  0.68 0.90 0.98  LATICACIDVEN 1.7  --   --   --   --     Estimated Creatinine Clearance: 113 mL/min (by C-G formula based on SCr of 0.98 mg/dL).    Allergies  Allergen Reactions   Codeine Other (See Comments)    Jittery     R. Samual Satterfield, PharmD PGY-1 Acute Care Pharmacy Resident 03/22/2024 10:07 AM

## 2024-03-22 NOTE — Progress Notes (Signed)
 [facility] PROGRESS NOTE    Progress Note  Thomas [name]  FMW:[id number] DOB: [birth year] DOA: 03/20/2024 PCP: Berneta Elsie Sayre, MD     Brief Narrative:   Thomas [name] is an 48 y.o. male past medical history significant for psoriatic arthritis obesity and alcoholic cirrhosis, history of right ankle fracture with hardware and surgical repair in July 2024, recently discharged from the hospital in September 2025 for hepatic hydrothorax underwent thoracocentesis at that time who comes in right ankle swelling and pain likely due to cellulitis in the setting of surgical hardware. Assessment/Plan:   Right ankle cellulitis with  Wound infection complicating hardware, initial encounter Continue empirically on IV vancomycin and Rocephin .   Tmax 98.6. Orthopedic surgery was consulted he status post washout and hardware removal. Surgical cultures are pending. Blood cultures are negative to date. CRP is 4.6 ESR is 39. Transition to IV ketorolac , oxycodone  and IV fentanyl  for breakthrough.  Keep an eye on the renal function.  Alcoholic cirrhosis of liver (HCC) with esophageal varices and portal hypertension: He has been counseled about alcohol cessation. He is currently on, nadolol  Protonix  and lactulose.  Resume Aldactone  and Lasix  tomorrow continue to monitor creatinine.  He is on ketorolac . LFTs are pending this morning.  Recurrent hepatic hydrothorax: Patient with some mild dyspnea. IR was consulted perform thoracocentesis and yielded 2.2 L of hazy fluid. Repeat a chest x-ray showed improved but persistent right pleural effusion. Satting well on room air.  Psoriatic arthritis: Steroids have been discontinued.  Class 3 severe obesity due to excess calories with body mass index (BMI) of 50.0 to 59.9 in adult  Counseling.  Thrombocytopenia Likely due to alcohol abuse try to keep platelet count greater than 50,000.     DVT prophylaxis: lovenoxno Family  Communication:none Status is: Inpatient Remains inpatient appropriate because: Right ankle cellulitis    Code Status:     Code Status Orders  (From admission, onward)           Start     Ordered   03/20/24 1148  Full code  Continuous       Question:  By:  Answer:  Consent: discussion documented in EHR   03/20/24 1149           Code Status History     Date Active Date Inactive Code Status Order ID Comments User Context   02/20/2024 2312 02/24/2024 2132 Full Code [id number]  Keturah Carrier, MD ED   01/07/2023 1611 01/13/2023 0251 Full Code [id number]  Maurice Sharlet GORMAN, PA-C Inpatient   12/25/2022 1753 01/07/2023 1522 Full Code [id number]  Seena Marsa NOVAK, MD ED   07/05/2022 0243 07/06/2022 1713 Full Code [id number]  Opyd, Evalene GORMAN, MD ED         IV Access:   Peripheral IV   Procedures and diagnostic studies:   DG Ankle 2 Views Right Result Date: 03/21/2024 CLINICAL DATA:  [id number] Surgery, elective [id number] Intraoperative right ankle EXAM: RIGHT ANKLE - 2 VIEW COMPARISON:  Radiographs 03/20/2024 and 03/13/2024. FINDINGS: C-arm fluoroscopy was provided in the operating room without the presence of a radiologist.1 second fluoroscopy time. 0.0245 mGy air kerma. Two C-arm fluoroscopic images were obtained intraoperatively and are submitted for post operative interpretation. Images demonstrate interval removal of the lateral fibular plate and screws. Cannulated screws remain within the distal fibular diaphysis and the medial malleolus. No evidence of acute fracture or dislocation. Surgical sponges are within the surgical bed on this intraoperative image. Please see intraoperative findings for further  detail. IMPRESSION: Intraoperative fluoroscopic guidance for hardware removal. Electronically Signed   By: Elsie Perone M.D.   On: 03/21/2024 19:40   DG C-Arm 1-60 Min-No Report Result Date: 03/21/2024 Fluoroscopy was utilized by the requesting physician.  No radiographic  interpretation.   US  THORACENTESIS ASP PLEURAL SPACE W/IMG GUIDE Result Date: 03/20/2024 INDICATION: 142230 Pleural effusion 142230 Patient with history of alcoholic cirrhosis, portal hypertension, splenomegaly, ascites, obesity, recurrent hepatic hydrothorax/right pleural effusion, dyspnea ; request received for therapeutic right thoracentesis EXAM: ULTRASOUND GUIDED THERAPEUTIC RIGHT THORACENTESIS MEDICATIONS: 8 mL 1% lidocaine  with epinephrine  to skin/subcutaneous tissue COMPLICATIONS: None immediate. PROCEDURE: An ultrasound guided thoracentesis was thoroughly discussed with the patient and questions answered. The benefits, risks, alternatives and complications were also discussed. The patient understands and wishes to proceed with the procedure. Written consent was obtained. Ultrasound was performed to localize and mark an adequate pocket of fluid in the right chest. The area was then prepped and draped in the normal sterile fashion. 1% Lidocaine  was used for local anesthesia. Under ultrasound guidance a 6 Fr Safe-T-Centesis catheter was introduced. Thoracentesis was performed. The catheter was removed and a dressing applied. FINDINGS: A total of approximately 2.2 L of slightly hazy, yellow fluid was removed. IMPRESSION: Successful ultrasound guided therapeutic RIGHT thoracentesis yielding 2.2 L of pleural fluid. Performed by: Franky Rakers, PA-C PLAN: The patient has required >/=2 thoracenteses (for hepatic hydrothorax) in a 30 day period and a formal evaluation by the [facility] has been arranged. Thom Hall, MD [facility] Electronically Signed   By: Thom Hall M.D.   On: 03/20/2024 19:31   DG Chest 1 View Result Date: 03/20/2024 CLINICAL DATA:  [id number] S/P thoracentesis [id number] EXAM: CHEST  1 VIEW COMPARISON:  Chest x-ray 03/20/2024 7:59 a.m. FINDINGS: The heart and mediastinal contours are  within normal limits. No focal consolidation. No pulmonary edema. Improved but persistent at least moderate right pleural effusion. No pneumothorax. No acute osseous abnormality. IMPRESSION: Improved but persistent at least moderate right pleural effusion. Electronically Signed   By: Morgane  Naveau M.D.   On: 03/20/2024 13:53   VAS US  LOWER EXTREMITY VENOUS (DVT) (7a-7p) Result Date: 03/20/2024  Lower Venous DVT Study [name]  Date of Exam:   03/20/2024 Medical Rec #: [id number]       Accession #:    [id number] Date of Birth: 18-Mar-1976        Patient Gender: M Patient Age:   89 years Exam Location:  [facility] Procedure:      VAS US  LOWER EXTREMITY VENOUS (DVT) Referring Phys: WARREN BARRETT --------------------------------------------------------------------------------  Indications: Swelling, Edema, SOB, ulceration, and Pain.  Comparison Study: Previous study on 10.1.2025. Performing Technologist: Edilia Elden Appl  Examination Guidelines: A complete evaluation includes B-mode imaging, spectral Doppler, color Doppler, and power Doppler as needed of all accessible portions of each vessel. Bilateral testing is considered an integral part of a complete examination. Limited examinations for reoccurring indications may be performed as noted. The reflux portion of the exam is performed with the patient in reverse Trendelenburg.  +---------+---------------+---------+-----------+----------+--------------+ RIGHT    CompressibilityPhasicitySpontaneityPropertiesThrombus Aging +---------+---------------+---------+-----------+----------+--------------+ CFV      Full           Yes      Yes                                 +---------+---------------+---------+-----------+----------+--------------+  SFJ      Full           Yes      Yes                                 +---------+---------------+---------+-----------+----------+--------------+ FV Prox  Full                                                         +---------+---------------+---------+-----------+----------+--------------+ FV Mid   Full                                                        +---------+---------------+---------+-----------+----------+--------------+ FV DistalFull                                                        +---------+---------------+---------+-----------+----------+--------------+ PFV      Full                                                        +---------+---------------+---------+-----------+----------+--------------+ POP      Full           Yes      Yes                                 +---------+---------------+---------+-----------+----------+--------------+ PTV      Full                                                        +---------+---------------+---------+-----------+----------+--------------+ PERO     Full                                                        +---------+---------------+---------+-----------+----------+--------------+   +----+---------------+---------+-----------+----------+--------------+ LEFTCompressibilityPhasicitySpontaneityPropertiesThrombus Aging +----+---------------+---------+-----------+----------+--------------+ CFV Full           Yes      Yes                                 +----+---------------+---------+-----------+----------+--------------+ SFJ Full           Yes      Yes                                 +----+---------------+---------+-----------+----------+--------------+     Summary: RIGHT: - There  is no evidence of deep vein thrombosis in the lower extremity.  - No cystic structure found in the popliteal fossa.  LEFT: - No evidence of common femoral vein obstruction.   *See table(s) above for measurements and observations. Electronically signed by Gaile New MD on 03/20/2024 at 11:55:49 AM.    Final    DG Chest Port 1 View Result Date: 03/20/2024 EXAM: 1 VIEW(S) XRAY OF THE  CHEST 03/20/2024 08:10:00 AM COMPARISON: 03/09/2024 CLINICAL HISTORY: SOB (shortness of breath) 141880. SOB and right lateral ankle wound with swelling. FINDINGS: LUNGS AND PLEURA: Large right pleural effusion, increased from prior exam. Hazy opacity throughout right lung may represent atelectasis, though airspace disease cannot be excluded. No pulmonary edema. No pneumothorax. HEART AND MEDIASTINUM: No acute abnormality of the cardiac and mediastinal silhouettes. BONES AND SOFT TISSUES: No acute osseous abnormality. IMPRESSION: 1. Large right pleural effusion, increased from prior exam. 2. Hazy opacity throughout the right lung that may represent atelectasis; superimposed airspace disease cannot be excluded. Electronically signed by: Waddell Calk MD 03/20/2024 08:20 AM EDT RP Workstation: [id number]   DG Ankle Complete Right Result Date: 03/20/2024 EXAM: 3 OR MORE VIEW(S) XRAY OF THE RIGHT ANKLE 03/20/2024 08:10:00 AM CLINICAL HISTORY: lateral ankle wound. SOB and right lateral ankle wound with swelling. COMPARISON: 03/13/2024 FINDINGS: BONES AND JOINTS: Status post ORIF of lateral and medial malleolus. Intact orthopedic hardware without periprosthetic fracture or lucency. No focal osseous lesion. No joint dislocation. Small ankle joint effusion. SOFT TISSUES: Focus of soft tissue gas about the lateral malleolus. Interval progression of diffuse soft tissue edema about the ankle and foot with new marked dorsal soft tissue swelling. IMPRESSION: 1. Interval progression of diffuse soft tissue edema about the ankle and foot with new marked dorsal soft tissue swelling and small ankle joint effusion. 2. New foci of soft tissue gas about the lateral malleolus. 3. Status post ORIF of lateral and medial malleolus with intact orthopedic hardware. No radiographic signs of acute osteomyelitis though the presence of soft tissue gas is concerning for an underlying infection Electronically signed by: Waddell Calk MD  03/20/2024 08:20 AM EDT RP Workstation: [id number]     Medical Consultants:   None.   Subjective:    Thomas [name] relates his pain is controlled.  Objective:    Vitals:   03/21/24 1816 03/21/24 2237 03/22/24 0156 03/22/24 0557  BP: 121/71 122/61 (!) 114/57 (!) 108/47  Pulse: 73 72 72 77  Resp: 18 18 18 18   Temp: 98.6 F (37 C) 97.8 F (36.6 C) 98 F (36.7 C) 98.1 F (36.7 C)  TempSrc: Oral Oral Oral Oral  SpO2: 96% 99% 96% 95%  Weight:      Height:       SpO2: 95 % O2 Flow Rate (L/min): 3 L/min   Intake/Output Summary (Last 24 hours) at 03/22/2024 0741 Last data filed at 03/22/2024 0600 Gross per 24 hour  Intake 3538.05 ml  Output 550 ml  Net 2988.05 ml   Filed Weights   03/21/24 1210  Weight: 119.7 kg    Exam: General exam: In no acute distress. Respiratory system: Good air movement and clear to auscultation. Cardiovascular system: S1 & S2 heard, RRR. No JVD. Gastrointestinal system: Abdomen is nondistended, soft and nontender.  Extremities: Right lower extremity is wrapped Skin: No rashes, lesions or ulcers Psychiatry: Judgement and insight appear normal. Mood & affect appropriate.  Data Reviewed:    Labs: Basic Metabolic Panel: Recent Labs  Lab 03/20/24 0730 03/21/24 0427  NA 132* 134*  K 4.3 4.7  CL 96* 97*  CO2 29 32  GLUCOSE 90 87  BUN 22* 22*  CREATININE 0.68 0.90  CALCIUM 9.0 8.6*   GFR Estimated Creatinine Clearance: 123 mL/min (by C-G formula based on SCr of 0.9 mg/dL). Liver Function Tests: Recent Labs  Lab 03/20/24 0730 03/21/24 0427  AST 117* 124*  ALT 123* 124*  ALKPHOS 264* 250*  BILITOT 4.6* 4.8*  PROT 6.9 6.0*  ALBUMIN  2.5* 2.3*   No results for input(s): LIPASE, AMYLASE in the last 168 hours. Recent Labs  Lab 03/20/24 1407  AMMONIA 90*   Coagulation profile Recent Labs  Lab 03/20/24 0732  INR 1.6*   COVID-19 Labs  Recent Labs    03/20/24 1835  CRP 4.6*    Lab Results  Component Value  Date   SARSCOV2NAA NEGATIVE 03/09/2024   SARSCOV2NAA NEGATIVE 12/25/2022    CBC: Recent Labs  Lab 03/20/24 0600 03/21/24 0427  WBC 13.3* 10.5  NEUTROABS 9.9*  --   HGB 13.3 11.9*  HCT 41.4 36.4*  MCV 99.8 100.6*  PLT 137* 112*   Cardiac Enzymes: No results for input(s): CKTOTAL, CKMB, CKMBINDEX, TROPONINI in the last 168 hours. BNP (last 3 results) No results for input(s): PROBNP in the last 8760 hours. CBG: No results for input(s): GLUCAP in the last 168 hours. D-Dimer: No results for input(s): DDIMER in the last 72 hours. Hgb A1c: No results for input(s): HGBA1C in the last 72 hours. Lipid Profile: No results for input(s): CHOL, HDL, LDLCALC, TRIG, CHOLHDL, LDLDIRECT in the last 72 hours. Thyroid  function studies: No results for input(s): TSH, T4TOTAL, T3FREE, THYROIDAB in the last 72 hours.  Invalid input(s): FREET3 Anemia work up: No results for input(s): VITAMINB12, FOLATE, FERRITIN, TIBC, IRON, RETICCTPCT in the last 72 hours. Sepsis Labs: Recent Labs  Lab 03/20/24 0545 03/20/24 0600 03/21/24 0427  WBC  --  13.3* 10.5  LATICACIDVEN 1.7  --   --    Microbiology Recent Results (from the past 240 hours)  Surgical pcr screen     Status: None   Collection Time: 03/21/24  4:42 AM   Specimen: Nasal Mucosa; Nasal Swab  Result Value Ref Range Status   MRSA, PCR NEGATIVE NEGATIVE Final   Staphylococcus aureus NEGATIVE NEGATIVE Final    Comment: (NOTE) The Xpert SA Assay (FDA approved for NASAL specimens in patients 50 years of age and older), is one component of a comprehensive surveillance program. It is not intended to diagnose infection nor to guide or monitor treatment. Performed at [facility], [address], [place], [place] [address]   Culture, blood (Routine X 2) w Reflex to ID Panel     Status: None (Preliminary result)   Collection Time: 03/21/24  7:18 AM   Specimen: BLOOD RIGHT  ARM  Result Value Ref Range Status   Specimen Description   Final    BLOOD RIGHT ARM Performed at [facility], [address] [place], [place] [address]    Special Requests   Final    BOTTLES DRAWN AEROBIC AND ANAEROBIC Blood Culture adequate volume Performed at [facility], [address], [place], [place] [address]    Culture PENDING  Incomplete   Report Status PENDING  Incomplete  Culture, blood (Routine X 2) w Reflex to ID Panel     Status: None (Preliminary result)   Collection Time: 03/21/24  7:18 AM   Specimen: BLOOD RIGHT HAND  Result Value  Ref Range Status   Specimen Description   Final    BLOOD RIGHT HAND Performed at [facility], [address] [place], [place] [address]    Special Requests   Final    BOTTLES DRAWN AEROBIC AND ANAEROBIC Blood Culture adequate volume Performed at [facility], [address] [place], [place] [address]    Culture PENDING  Incomplete   Report Status PENDING  Incomplete  Aerobic/Anaerobic Culture w Gram Stain (surgical/deep wound)     Status: None (Preliminary result)   Collection Time: 03/21/24  2:11 PM   Specimen: Bone; Tissue  Result Value Ref Range Status   Specimen Description   Final    BONE Performed at [facility], [address], [place], [place] [address]    Special Requests   Final    NONE Performed at [facility], [address] [place], [place] [address]    Gram Stain   Final    NO WBC SEEN NO ORGANISMS SEEN Performed at [facility], [address], [place], [place] [address]    Culture PENDING  Incomplete   Report Status PENDING  Incomplete     Medications:    enoxaparin  (LOVENOX ) injection  40 mg Subcutaneous Q24H   [START ON 03/23/2024] furosemide   40 mg Oral Daily   lactulose  30 g Oral TID   loteprednol  1 drop Both Eyes QID   nadolol   20 mg Oral Daily   pantoprazole   40 mg Oral BID   [START ON  03/23/2024] spironolactone   25 mg Oral Daily   Continuous Infusions:  cefTRIAXone  (ROCEPHIN )  IV     vancomycin 1,250 mg (03/21/24 2217)      LOS: 2 days   Thomas [name]  [facility]  03/22/2024, 7:41 AM

## 2024-03-22 NOTE — Progress Notes (Signed)
 Peripherally Inserted Central Catheter Placement  The IV Nurse has discussed with the patient and/or persons authorized to consent for the patient, the purpose of this procedure and the potential benefits and risks involved with this procedure.  The benefits include less needle sticks, lab draws from the catheter, and the patient may be discharged home with the catheter. Risks include, but not limited to, infection, bleeding, blood clot (thrombus formation), and puncture of an artery; nerve damage and irregular heartbeat and possibility to perform a PICC exchange if needed/ordered by physician.  Alternatives to this procedure were also discussed.  Bard Power PICC patient education guide, fact sheet on infection prevention and patient information card has been provided to patient /or left at bedside.    PICC Placement Documentation  PICC Single Lumen 03/22/24 Right Basilic 41 cm 0 cm (Active)  Indication for Insertion or Continuance of Line Prolonged intravenous therapies 03/22/24 2145  Exposed Catheter (cm) 0 cm 03/22/24 2145  Site Assessment Clean, Dry, Intact 03/22/24 2145  Line Status Flushed;Saline locked;Blood return noted 03/22/24 2145  Dressing Type Transparent;Securing device 03/22/24 2145  Dressing Status Antimicrobial disc/dressing in place;Clean, Dry, Intact 03/22/24 2145  Line Care Connections checked and tightened 03/22/24 2145  Dressing Intervention New dressing;Adhesive placed at insertion site (IV team only) 03/22/24 2145  Dressing Change Due 03/29/24 03/22/24 2145       Thomas [name] 03/22/2024, 10:08 PM

## 2024-03-22 NOTE — Consult Note (Signed)
 [facility]  Total days of antibiotics 3       Reason for Consult:right ankle osteo andHW infection    Referring Physician: odell castor  Principal Problem:   Wound infection complicating hardware, initial encounter Active Problems:   Alcoholic cirrhosis of liver (HCC)   Class 3 severe obesity due to excess calories with body mass index (BMI) of 50.0 to 59.9 in adult (HCC)   Psoriatic arthritis (HCC)   Thrombocytopenia   Hydrothorax    HPI: Thomas [name] is a 48 y.o. male M with history of ETOH cirrhosis, psoriasis, who had hx of right ankle fracture requiring ORIF/HW placement. He reports was doing well up until 4-6 wk ago, when he would have intermittent swelling of his right ankle. He sought care at urgent care and gave him course of steroids but only lasting brief respite. Prior to hospitalization, he noticed increasing swelling, redness, and drainage from his right ankle. Due to feeling poorly, he was urged to go to ED for evaluation. He was having drainage from left malleolar wound and HW exposure -concern for osteomyelitis. His labs showed WBC of 13.3K, sed rate of 39, alt of 123, ast 117,  cr 0.98, plt 137.  Dr edna performed IX D with removal of HW nad wound revision on 10/13. Patient has tolerated vancomycin and ceftriaxone . Or cultures thus far showing staph aureus, but not finalized. In addition to managing his right ankle infection, he was also found to have right hydrothorax for which he had 2L thoracentesis with improvement in symptoms. ID asked to weigh in on abtx management  Past Medical History:  Diagnosis Date   Acute conjunctivitis of both eyes 10/22/2021   Acute sinusitis 06/16/2013   Alcoholic cirrhosis (HCC)    Alcoholic fatty liver 01/16/2010   Needs final HBV and HAV vaccines on or after 10/25/2012    Alcoholism (HCC) 12/25/2011   Allergic rhinitis    Childhood asthma    Elevated transaminase level 06/10/2007   AST: 80 ALT:  136 in 8/11: Hepatitis A., B and C negative.    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    Sleep apnea     Allergies:  Allergies  Allergen Reactions   Codeine Other (See Comments)    Jittery      MEDICATIONS:  enoxaparin  (LOVENOX ) injection  40 mg Subcutaneous Q24H   [START ON 03/23/2024] furosemide   40 mg Oral Daily   lactulose  30 g Oral TID   loteprednol  1 drop Both Eyes QID   nadolol   20 mg Oral Daily   pantoprazole   40 mg Oral BID   [START ON 03/23/2024] spironolactone   25 mg Oral Daily    Social History   Tobacco Use   Smoking status: Some Days    Types: Cigars    Passive exposure: Past   Smokeless tobacco: Never   Tobacco comments:    Cigars occasional  Vaping Use   Vaping status: Never Used  Substance Use Topics   Alcohol use: Not Currently    Comment: Sober since November 2024   Drug use: Never    Comment: history of cocaine abuse    Family History  Problem Relation Age of Onset   Liver disease Mother    Depression Mother  Liver disease Father    Diabetes Father    Hypertension Father    Liver disease Sister    Diabetes Other    Colon cancer Neg Hx    Esophageal cancer Neg Hx    Stomach cancer Neg Hx    Colon polyps Neg Hx     Review of Systems -  Right ankle swelling, but now improving. 12 point ros is negative  OBJECTIVE: Temp:  [97.7 F (36.5 C)-98.6 F (37 C)] 97.7 F (36.5 C) (10/14 1404) Pulse Rate:  [70-79] 70 (10/14 1404) Resp:  [18-23] 19 (10/14 1404) BP: (108-135)/(47-82) 132/73 (10/14 1404) SpO2:  [95 %-99 %] 96 % (10/14 1404) Physical Exam  Constitutional: He is oriented to person, place, and time. He appears well-developed and well-nourished. No distress.  HENT:  Mouth/Throat: Oropharynx is clear and moist. No oropharyngeal exudate.   Cardiovascular: Normal rate, regular rhythm and normal heart sounds. Exam reveals no gallop and no friction rub.  No murmur heard.  Pulmonary/Chest: Effort normal and breath sounds normal. No respiratory distress. He has no wheezes.  Abdominal: Soft. Bowel sounds are normal. He exhibits no distension. There is no tenderness.  Zku:mphyu lower leg wrapped from surgery Neurological: He is alert and oriented to person, place, and time.  Skin: Skin is warm and dry. Scattered echymosis Psychiatric: He has a normal mood and affect. His behavior is normal.   LABS: Results for orders placed or performed during the hospital encounter of 03/20/24 (from the past 48 hours)  Sedimentation rate     Status: Abnormal   Collection Time: 03/20/24  6:35 PM  Result Value Ref Range   Sed Rate 39 (H) 0 - 16 mm/hr    Comment: Performed at [facility], [address], [place], [place] [address]  C-reactive protein     Status: Abnormal   Collection Time: 03/20/24  6:35 PM  Result Value Ref Range   CRP 4.6 (H) <1.0 mg/dL    Comment: Performed at [facility], [address], [place], [place] [address]  CBC     Status: Abnormal   Collection Time: 03/21/24  4:27 AM  Result Value Ref Range   WBC 10.5 4.0 - 10.5 K/uL   RBC 3.62 (L) 4.22 - 5.81 MIL/uL   Hemoglobin 11.9 (L) 13.0 - 17.0 g/dL   HCT 63.5 (L) 60.9 - 47.9 %   MCV 100.6 (H) 80.0 - 100.0 fL   MCH 32.9 26.0 - 34.0 pg   MCHC 32.7 30.0 - 36.0 g/dL   RDW 82.8 (H) 88.4 - 84.4 %   Platelets 112 (L) 150 - 400 K/uL   nRBC 0.0 0.0 - 0.2 %    Comment: Performed at [facility], [address], [place], [place] [address]  Comprehensive metabolic panel     Status: Abnormal   Collection Time: 03/21/24  4:27 AM  Result Value Ref Range   Sodium 134 (L) 135 - 145 mmol/L   Potassium 4.7 3.5 - 5.1 mmol/L   Chloride 97 (L) 98 - 111 mmol/L   CO2 32 22 - 32 mmol/L   Glucose, Bld 87 70 - 99 mg/dL    Comment: Glucose  reference range applies only to samples taken after fasting for at least 8 hours.   BUN 22 (H) 6 - 20 mg/dL   Creatinine, Ser 9.09 0.61 - 1.24 mg/dL    Comment: ICTERUS AT THIS LEVEL MAY AFFECT RESULT   Calcium 8.6 (L) 8.9 -  10.3 mg/dL   Total Protein 6.0 (L) 6.5 - 8.1 g/dL   Albumin  2.3 (L) 3.5 - 5.0 g/dL   AST 875 (H) 15 - 41 U/L    Comment: HEMOLYSIS AT THIS LEVEL MAY AFFECT RESULT   ALT 124 (H) 0 - 44 U/L   Alkaline Phosphatase 250 (H) 38 - 126 U/L   Total Bilirubin 4.8 (H) 0.0 - 1.2 mg/dL   GFR, Estimated >39 >39 mL/min    Comment: (NOTE) Calculated using the CKD-EPI Creatinine Equation (2021)    Anion gap 4 (L) 5 - 15    Comment: Performed at [facility], [address] [place], [place] [address]  Surgical pcr screen     Status: None   Collection Time: 03/21/24  4:42 AM   Specimen: Nasal Mucosa; Nasal Swab  Result Value Ref Range   MRSA, PCR NEGATIVE NEGATIVE   Staphylococcus aureus NEGATIVE NEGATIVE    Comment: (NOTE) The Xpert SA Assay (FDA approved for NASAL specimens in patients 49 years of age and older), is one component of a comprehensive surveillance program. It is not intended to diagnose infection nor to guide or monitor treatment. Performed at [facility], [address], [place], [place] [address]   Culture, blood (Routine X 2) w Reflex to ID Panel     Status: None (Preliminary result)   Collection Time: 03/21/24  7:18 AM   Specimen: BLOOD RIGHT ARM  Result Value Ref Range   Specimen Description      BLOOD RIGHT ARM Performed at [facility], [address] [place], [place] [address]    Special Requests      BOTTLES DRAWN AEROBIC AND ANAEROBIC Blood Culture adequate volume Performed at [facility], [address], [place], [place] [address]    Culture      NO GROWTH < 24 HOURS Performed at [facility], [address], [place], [place] [address]    Report Status PENDING    Culture, blood (Routine X 2) w Reflex to ID Panel     Status: None (Preliminary result)   Collection Time: 03/21/24  7:18 AM   Specimen: BLOOD RIGHT HAND  Result Value Ref Range   Specimen Description      BLOOD RIGHT HAND Performed at [facility], [address], [place], [place] [address]    Special Requests      BOTTLES DRAWN AEROBIC AND ANAEROBIC Blood Culture adequate volume Performed at [facility], [address], [place], [place] [address]    Culture      NO GROWTH < 24 HOURS Performed at [facility], [address] [place], [place] [address]    Report Status PENDING   Aerobic/Anaerobic Culture w Gram Stain (surgical/deep wound)     Status: None (Preliminary result)   Collection Time: 03/21/24  2:11 PM   Specimen: Bone; Tissue  Result Value Ref Range   Specimen Description      BONE Performed at [facility], [address] [place], [place] [address]    Special Requests      NONE Performed at [facility], [address], [place], [place] [address]    Gram Stain NO WBC SEEN NO ORGANISMS SEEN     Culture      FEW STAPHYLOCOCCUS AUREUS CULTURE REINCUBATED FOR BETTER GROWTH Performed at [facility], [address] [place], [place] [address]  Report Status PENDING   Comprehensive metabolic panel     Status: Abnormal   Collection Time: 03/22/24  8:26 AM  Result Value Ref Range   Sodium 131 (L) 135 - 145 mmol/L   Potassium 4.3 3.5 - 5.1 mmol/L   Chloride 95 (L) 98 - 111 mmol/L   CO2 29 22 - 32 mmol/L   Glucose, Bld 127 (H) 70 - 99 mg/dL    Comment: Glucose reference range applies only to samples taken after fasting for at least 8 hours.   BUN 28 (H) 6 - 20 mg/dL   Creatinine, Ser 9.01 0.61 - 1.24 mg/dL   Calcium 8.7 (L) 8.9 - 10.3 mg/dL   Total Protein 6.6 6.5 - 8.1 g/dL   Albumin  2.5 (L) 3.5 - 5.0 g/dL   AST 78 (H) 15 - 41 U/L   ALT 104 (H) 0 - 44 U/L   Alkaline Phosphatase  237 (H) 38 - 126 U/L   Total Bilirubin 3.9 (H) 0.0 - 1.2 mg/dL   GFR, Estimated >39 >39 mL/min    Comment: (NOTE) Calculated using the CKD-EPI Creatinine Equation (2021)    Anion gap 7 5 - 15    Comment: Performed at [facility], [address], [place], [place] [address]    MICRO: Blood cx NGTD 10/13 or culture -staph aureus IMAGING: US  EKG SITE RITE Result Date: 03/22/2024 If Site Rite image not attached, placement could not be confirmed due to current cardiac rhythm.  DG Ankle 2 Views Right Result Date: 03/21/2024 CLINICAL DATA:  [id number] Surgery, elective [phone] Intraoperative right ankle EXAM: RIGHT ANKLE - 2 VIEW COMPARISON:  Radiographs 03/20/2024 and 03/13/2024. FINDINGS: C-arm fluoroscopy was provided in the operating room without the presence of a radiologist.1 second fluoroscopy time. 0.0245 mGy air kerma. Two C-arm fluoroscopic images were obtained intraoperatively and are submitted for post operative interpretation. Images demonstrate interval removal of the lateral fibular plate and screws. Cannulated screws remain within the distal fibular diaphysis and the medial malleolus. No evidence of acute fracture or dislocation. Surgical sponges are within the surgical bed on this intraoperative image. Please see intraoperative findings for further detail. IMPRESSION: Intraoperative fluoroscopic guidance for hardware removal. Electronically Signed   By: Elsie Perone M.D.   On: 03/21/2024 19:40   DG C-Arm 1-60 Min-No Report Result Date: 03/21/2024 Fluoroscopy was utilized by the requesting physician.  No radiographic interpretation.    HISTORICAL MICRO/IMAGING  Assessment/Plan:  48yo M with ETOH cirrhosis and history of right ankle fracture s/p ORIF 1 year ago but now admitted for right ankle HW infection/OM s/p HW removal on 10/13. Prelim cultures showing staph aureus - recommend to continue on vancomycin and ceftriaxone  for now. Will narrow once more info  from microbiology - recommend to increase ceftriaxone  to 2gm iv daily - will place picc line order - anticipate 4-6 wk iv abtx  Transaminitis due to cirrhosis = recommend to get hep B s ab and hep A ab checked to see that he is immune  Etoh cirrhosis = continue on spironolactone /lasix  plus lactulose  Health maintenance = recommend vaccine for flu and covid on discharge  evaluation of this patient requires complex antimicrobial therapy evaluation and counseling and isolation needs for disease transmission risk assessment and mitigation.    Montie FURY Luiz MD MPH [facility] [phone]

## 2024-03-22 NOTE — Anesthesia Postprocedure Evaluation (Signed)
 Anesthesia Post Note  Patient: Thomas [name]  Procedure(s) Performed: INCISION AND DRAINAGE OF DEEP ABSCESS, ANKLE (Right: Ankle)     Patient location during evaluation: PACU Anesthesia Type: General Level of consciousness: awake Pain management: pain level controlled Vital Signs Assessment: post-procedure vital signs reviewed and stable Respiratory status: spontaneous breathing Cardiovascular status: blood pressure returned to baseline Postop Assessment: patient able to bend at knees Anesthetic complications: no   No notable events documented.  Last Vitals:  Vitals:   03/22/24 0557 03/22/24 1404  BP: (!) 108/47 132/73  Pulse: 77 70  Resp: 18 19  Temp: 36.7 C 36.5 C  SpO2: 95% 96%    Last Pain:  Vitals:   03/22/24 1404  TempSrc: Oral  PainSc:                  Lauraine KATHEE [place]

## 2024-03-22 NOTE — Evaluation (Addendum)
 Physical Therapy Evaluation Patient Details Name: Thomas [name] MRN: [id number] DOB: Feb 04, 1976 Today's Date: 03/22/2024  History of Present Illness  48 y.o. male who comes in right ankle swelling and pain likely due to cellulitis in the setting of surgical hardware. S/p R ankle I&D for osteomyelitis and hardware removal 03/21/24. Past medical history significant for psoriatic arthritis obesity and alcoholic cirrhosis, history of right ankle fracture with hardware and surgical repair in July 2024, recently discharged from the hospital in September 2025 for hepatic hydrothorax underwent thoracocentesis at that time  Clinical Impression  Pt admitted with above diagnosis. Pt ambulated 24' with RW with good adherence to NWB status, no loss of balance, distance limited by fatigue. Pt reports he does not have assistance available at home, his father (with whom he lives) is currently hospitalized. He has stairs to enter his home for which he would require assistance to navigate. Pt requesting ST-SNF.  Pt currently with functional limitations due to the deficits listed below (see PT Problem List). Pt will benefit from acute skilled PT to increase their independence and safety with mobility to allow discharge.           If plan is discharge home, recommend the following: A little help with bathing/dressing/bathroom;Assistance with cooking/housework;Assist for transportation;Help with stairs or ramp for entrance   Can travel by private vehicle   Yes    Equipment Recommendations Hospital bed  Recommendations for Other Services       Functional Status Assessment Patient has had a recent decline in their functional status and demonstrates the ability to make significant improvements in function in a reasonable and predictable amount of time.     Precautions / Restrictions Precautions Precautions: Fall Recall of Precautions/Restrictions: Intact Restrictions Weight Bearing Restrictions Per Provider  Order: Yes RLE Weight Bearing Per Provider Order: Non weight bearing      Mobility  Bed Mobility Overal bed mobility: Modified Independent             General bed mobility comments: HOB up, used rails    Transfers Overall transfer level: Needs assistance Equipment used: Rolling walker (2 wheels) Transfers: Sit to/from Stand Sit to Stand: Contact guard assist Stand pivot transfers: Contact guard assist, From elevated surface         General transfer comment: VCs hand placement, increased time/effort, STS from eleveated bed    Ambulation/Gait Ambulation/Gait assistance: Contact guard assist Gait Distance (Feet): 50 Feet Assistive device: Rolling walker (2 wheels) Gait Pattern/deviations: Step-to pattern Gait velocity: reduced     General Gait Details: good adherence to NWB status RLE, no loss of balance, distance limited by BUE and generalized fatigue  Stairs            Wheelchair Mobility     Tilt Bed    Modified Rankin (Stroke Patients Only)       Balance Overall balance assessment: Needs assistance Sitting-balance support: No upper extremity supported, Feet supported Sitting balance-Leahy Scale: Good     Standing balance support: No upper extremity supported, During functional activity Standing balance-Leahy Scale: Fair                               Pertinent Vitals/Pain Pain Assessment Pain Score: 6  Pain Location: R ankle Pain Descriptors / Indicators: Sore Pain Intervention(s): Limited activity within patient's tolerance, Monitored during session, Repositioned, Premedicated before session    Home Living Family/patient expects to be discharged to:: Private residence  Living Arrangements: Parent Available Help at Discharge: Family;Available 24 hours/day Type of Home: House Home Access: Stairs to enter Entrance Stairs-Rails: Right Entrance Stairs-Number of Steps: 3 Alternate Level Stairs-Number of Steps: Flight Home  Layout: Two level;Bed/bath upstairs;1/2 bath on main level;Other (Comment) (has been sleeping on the couch) Home Equipment: Cane - single point;Tub bench;Rolling Walker (2 wheels);Wheelchair - manual Additional Comments: ramp to be installed, dad lives with pt but dad is in hospital, so doesn't have help available, wants to rent hospital bed to use on main level; pt declined trial of knee scooter    Prior Function Prior Level of Function : Independent/Modified Independent;Driving             Mobility Comments: Indep no AD. Denies falls in the past 6 mo. ADLs Comments: Indep with ADL/IADLs. Pt utilizes tub bench. Enjoys dancing. Not working since May, laid off. Drives     Extremity/Trunk Assessment   Upper Extremity Assessment Upper Extremity Assessment: Overall WFL for tasks assessed    Lower Extremity Assessment Lower Extremity Assessment: RLE deficits/detail RLE Deficits / Details: ankle in splint, knee ext at least 3/5, sensation intact at toes, can wiggle toes, can march in seated position RLE: Unable to fully assess due to immobilization RLE Sensation: WNL RLE Coordination: WNL    Cervical / Trunk Assessment Cervical / Trunk Assessment: Normal Cervical / Trunk Exceptions: Body Habitus  Communication   Communication Communication: No apparent difficulties    Cognition Arousal: Alert Behavior During Therapy: WFL for tasks assessed/performed   PT - Cognitive impairments: No apparent impairments                         Following commands: Intact       Cueing Cueing Techniques: Verbal cues     General Comments      Exercises  Long arc quad x 10 RLE seated AROM Marching R x 5 seated RLE   Assessment/Plan    PT Assessment Patient needs continued PT services  PT Problem List Decreased strength;Decreased range of motion;Decreased activity tolerance;Decreased balance;Decreased mobility;Pain       PT Treatment Interventions DME instruction;Gait  training;Functional mobility training;Therapeutic activities;Therapeutic exercise;Balance training;Patient/family education    PT Goals (Current goals can be found in the Care Plan section)  Acute Rehab PT Goals Patient Stated Goal: wants to go to rehab as he doesn't have help available at home (father with whom he lives is in hospital) PT Goal Formulation: With patient Time For Goal Achievement: 04/05/24 Potential to Achieve Goals: Good    Frequency Min 3X/week     Co-evaluation               AM-PAC PT 6 Clicks Mobility  Outcome Measure Help needed turning from your back to your side while in a flat bed without using bedrails?: None Help needed moving from lying on your back to sitting on the side of a flat bed without using bedrails?: None Help needed moving to and from a bed to a chair (including a wheelchair)?: A Little Help needed standing up from a chair using your arms (e.g., wheelchair or bedside chair)?: A Little Help needed to walk in hospital room?: None Help needed climbing 3-5 steps with a railing? : A Lot 6 Click Score: 20    End of Session Equipment Utilized During Treatment: Gait belt Activity Tolerance: Patient tolerated treatment well;Patient limited by fatigue Patient left: in chair;with call bell/phone within reach Nurse Communication: Mobility status PT  Visit Diagnosis: Difficulty in walking, not elsewhere classified (R26.2);Pain Pain - Right/Left: Right Pain - part of body: Ankle and joints of foot    Time: 0952-1009 PT Time Calculation (min) (ACUTE ONLY): 17 min   Charges:   PT Evaluation $PT Eval Moderate Complexity: 1 Mod   PT General Charges $$ ACUTE PT VISIT: 1 Visit        Sylvan Delon Copp PT 03/22/2024  Acute Rehabilitation Services  Office [phone]

## 2024-03-22 NOTE — Progress Notes (Signed)
 Patient complained of cramps in his hands. Patient requested his personal hand warmers from his bag. Plan of care ongoing.

## 2024-03-22 NOTE — Plan of Care (Signed)
   Problem: Nutrition: Goal: Adequate nutrition will be maintained Outcome: Progressing   Problem: Elimination: Goal: Will not experience complications related to bowel motility Outcome: Progressing   Problem: Safety: Goal: Ability to remain free from injury will improve Outcome: Progressing

## 2024-03-22 NOTE — Progress Notes (Signed)
     Subjective:  Patient reports that oxycodone  makes him jittery.  Has done better in the past with Dilaudid  and tramadol .  Transition his oxycodone  to low-dose p.o. Dilaudid  today.  Discussed plan for continue antibiotics pending results of IntraOp cultures. Plan for mobilization with PT today.  Yesterday's total administered Morphine  Milligram Equivalents: 90   Objective:   VITALS:   Vitals:   03/21/24 1816 03/21/24 2237 03/22/24 0156 03/22/24 0557  BP: 121/71 122/61 (!) 114/57 (!) 108/47  Pulse: 73 72 72 77  Resp: 18 18 18 18   Temp: 98.6 F (37 C) 97.8 F (36.6 C) 98 F (36.7 C) 98.1 F (36.7 C)  TempSrc: Oral Oral Oral Oral  SpO2: 96% 99% 96% 95%  Weight:      Height:        Sensation intact distally Dorsiflexion/Plantar flexion intact Splint c/d/i  Lab Results  Component Value Date   WBC 10.5 03/21/2024   HGB 11.9 (L) 03/21/2024   HCT 36.4 (L) 03/21/2024   MCV 100.6 (H) 03/21/2024   PLT 112 (L) 03/21/2024   BMET    Component Value Date/Time   NA 134 (L) 03/21/2024 0427   K 4.7 03/21/2024 0427   CL 97 (L) 03/21/2024 0427   CO2 32 03/21/2024 0427   GLUCOSE 87 03/21/2024 0427   BUN 22 (H) 03/21/2024 0427   CREATININE 0.90 03/21/2024 0427   CREATININE 0.66 06/14/2021 1419   CALCIUM 8.6 (L) 03/21/2024 0427   GFRNONAA >60 03/21/2024 0427   GFRNONAA >89 06/22/2013 0958      Xray: Status post partial hardware removal no adverse features.  Assessment/Plan: 1 Day Post-Op   Principal Problem:   Wound infection complicating hardware, initial encounter Active Problems:   Alcoholic cirrhosis of liver (HCC)   Class 3 severe obesity due to excess calories with body mass index (BMI) of 50.0 to 59.9 in adult [facility])   Psoriatic arthritis (HCC)   Thrombocytopenia   Hydrothorax  S/p R ankle I&D for osteomyelitis and draining sinus 03/21/24  Post op recs: WB: NWB RLE in splint Antibiotics: Continue on broad-spectrum antibiotics, broad-spectrum antibiotics,  antibiotic plan per infectious disease. Imaging: PACU xrays Dressing: keep splint intact until follow up DVT prophylaxis: Lovenox  in house, discharged with aspirin 81mg  BID x4 weeks Follow up: 2 weeks after surgery for a wound check and suture removal with Dr. Edna at [facility].  Address: [address], [place], [place] [address]  Office Phone: [phone]    TORIBIO DELENA EDNA 03/22/2024, 6:40 AM   TORIBIO Edna, MD  Contact information:   [phone] 7am-5pm epic message Dr. Edna, or call office for patient follow up: [phone] After hours and holidays please check [email or web address] for group call information for [facility]

## 2024-03-23 ENCOUNTER — Ambulatory Visit: Admitting: Pharmacist

## 2024-03-23 DIAGNOSIS — R7401 Elevation of levels of liver transaminase levels: Secondary | ICD-10-CM | POA: Diagnosis not present

## 2024-03-23 DIAGNOSIS — D72829 Elevated white blood cell count, unspecified: Secondary | ICD-10-CM

## 2024-03-23 DIAGNOSIS — T847XXA Infection and inflammatory reaction due to other internal orthopedic prosthetic devices, implants and grafts, initial encounter: Secondary | ICD-10-CM | POA: Diagnosis not present

## 2024-03-23 DIAGNOSIS — K7030 Alcoholic cirrhosis of liver without ascites: Secondary | ICD-10-CM | POA: Diagnosis not present

## 2024-03-23 DIAGNOSIS — B957 Other staphylococcus as the cause of diseases classified elsewhere: Secondary | ICD-10-CM | POA: Diagnosis not present

## 2024-03-23 DIAGNOSIS — K7682 Hepatic encephalopathy: Secondary | ICD-10-CM

## 2024-03-23 DIAGNOSIS — M86671 Other chronic osteomyelitis, right ankle and foot: Secondary | ICD-10-CM | POA: Diagnosis not present

## 2024-03-23 LAB — COMPREHENSIVE METABOLIC PANEL WITH GFR
ALT: 114 U/L — ABNORMAL HIGH (ref 0–44)
AST: 116 U/L — ABNORMAL HIGH (ref 15–41)
Albumin: 2.4 g/dL — ABNORMAL LOW (ref 3.5–5.0)
Alkaline Phosphatase: 255 U/L — ABNORMAL HIGH (ref 38–126)
Anion gap: 9 (ref 5–15)
BUN: 26 mg/dL — ABNORMAL HIGH (ref 6–20)
CO2: 27 mmol/L (ref 22–32)
Calcium: 8.6 mg/dL — ABNORMAL LOW (ref 8.9–10.3)
Chloride: 98 mmol/L (ref 98–111)
Creatinine, Ser: 0.9 mg/dL (ref 0.61–1.24)
GFR, Estimated: >60 mL/min (ref >60)
Glucose, Bld: 111 mg/dL — ABNORMAL HIGH (ref 70–99)
Potassium: 3.9 mmol/L (ref 3.5–5.1)
Sodium: 133 mmol/L — ABNORMAL LOW (ref 135–145)
Total Bilirubin: 3 mg/dL — ABNORMAL HIGH (ref 0.0–1.2)
Total Protein: 6 g/dL — ABNORMAL LOW (ref 6.5–8.1)

## 2024-03-23 LAB — HEPATITIS B SURFACE ANTIBODY,QUALITATIVE: Hep B S Ab: NONREACTIVE

## 2024-03-23 LAB — HEPATITIS A ANTIBODY, TOTAL: hep A Total Ab: REACTIVE — AB

## 2024-03-23 MED ORDER — LACTULOSE 10 GM/15ML PO SOLN
30.0000 g | Freq: Every day | ORAL | Status: DC
  Administered 2024-03-23 – 2024-03-26 (×4): 30 g via ORAL
  Filled 2024-03-23 (×4): qty 45

## 2024-03-23 NOTE — TOC Initial Note (Signed)
 Transition of Care [facility]) - Initial/Assessment Note    Patient Details  Name: Thomas [name] MRN: [id number] Date of Birth: August 23, 1975  Transition of Care [facility]) CM/SW Contact:    NORMAN ASPEN, LCSW Phone Number: 03/23/2024, 3:17 PM  Clinical Narrative:                  Met with pt to review anticipated dc needs.  Pt frustrated with current situation and now plans for several weeks of IV abx.  Mobility is limited especially with NWB restrictions and pt very concerned that his family is not available 24/7 and father is in poor health himself.  Pt feels that he will need post acute placement and prefers CIR if at all possible.  He has been to CIR in the past.  We discussed this and agreed that, we can request consideration for CIR, but with SNF being the back up plan.  Pt in agreement with this.  Expected Discharge Plan: Skilled Nursing Facility (vs. CIR?) Barriers to Discharge: Continued Medical Work up, English as a second language teacher   Patient Goals and CMS Choice Patient states their goals for this hospitalization and ongoing recovery are:: pt hopeful for CIR but may have to pursue SNF if not accpeted to CIR          Expected Discharge Plan and Services In-house Referral: Clinical Social Work     Living arrangements for the past 2 months: Single Family Home                                      Prior Living Arrangements/Services Living arrangements for the past 2 months: Single Family Home Lives with:: Relatives Patient language and need for interpreter reviewed:: Yes Do you feel safe going back to the place where you live?: Yes      Need for Family Participation in Patient Care: Yes (Comment) Care giver support system in place?: No (comment)   Criminal Activity/Legal Involvement Pertinent to Current Situation/Hospitalization: No - Comment as needed  Activities of Daily Living   ADL Screening (condition at time of admission) Independently performs ADLs?: Yes  (appropriate for developmental age) Is the patient deaf or have difficulty hearing?: No Does the patient have difficulty seeing, even when wearing glasses/contacts?: No Does the patient have difficulty concentrating, remembering, or making decisions?: No  Permission Sought/Granted Permission sought to share information with : Facility Medical sales representative, Family Supports Permission granted to share information with : Yes, Verbal Permission Granted  Share Information with NAME: sister or father           Emotional Assessment Appearance:: Appears stated age Attitude/Demeanor/Rapport: Engaged Affect (typically observed): Frustrated Orientation: : Oriented to Self, Oriented to Place, Oriented to  Time, Oriented to Situation Alcohol / Substance Use: Alcohol Use Psych Involvement: No (comment)  Admission diagnosis:  Recurrent right pleural effusion [J90] Wound infection complicating hardware, initial encounter [T84.7XXA] Pressure injury of deep tissue of right ankle [L89.516] Patient Active Problem List   Diagnosis Date Noted   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   Wound infection complicating hardware, initial encounter 03/20/2024   High risk medication use 02/29/2024   Pleural effusion associated with hepatic disorder 02/20/2024   Cellulitis of abdominal wall 03/10/2023   Esophageal varices without bleeding (HCC) 02/13/2023   History of ETOH abuse 01/09/2023   Ankle fracture 01/07/2023   Localized edema 01/07/2023   Closed right ankle fracture 01/06/2023   Thrombocytopenia 01/06/2023  Cirrhosis of liver (HCC) 12/25/2022   Secondary esophageal varices with bleeding (HCC) 12/25/2022   Reactive airway disease 07/28/2022   Acute upper GI bleed 07/05/2022   Hematemesis with nausea 07/05/2022   Melena 07/05/2022   Pharyngitis 10/22/2021   Psoriatic arthritis (HCC) 10/03/2021   Viral upper respiratory tract infection 10/03/2021   Acute blood loss anemia 08/26/2021   Screening for  tuberculosis 08/15/2021   Elevated BP without diagnosis of hypertension 06/14/2021   Need for immunization against influenza 06/14/2021   Alcoholic cirrhosis of liver (HCC) 09/26/2019   Class 3 severe obesity due to excess calories with body mass index (BMI) of 50.0 to 59.9 in adult [facility]) 09/26/2019   Healthcare maintenance 02/07/2019   Psoriasis 02/07/2019   Vitamin D  deficiency 05/07/2016   OSA (obstructive sleep apnea) 06/23/2013   Insomnia 06/16/2013   Alcohol use disorder 12/25/2011   Scrotal varices, 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   Alcoholic fatty liver 01/16/2010   Allergic rhinitis 10/12/2007   PCP:  Berneta Elsie Sayre, MD Pharmacy:   [phone] [place], [place] - [address] [place] [address] Phone: [phone] Fax: [phone]  [facility] [place] [place] [facility] [address] [place] [address] Phone: [phone] Fax: [phone]     Social Drivers of Health (SDOH) Social History: SDOH Screenings   Food Insecurity: No Food Insecurity (03/20/2024)  Housing: Unknown (03/20/2024)  Transportation Needs: No Transportation Needs (03/20/2024)  Utilities: Not At Risk (03/20/2024)  Depression (PHQ2-9): Low Risk  (07/28/2022)  Financial Resource Strain: Low Risk  (11/26/2023)  Physical Activity: Insufficiently Active (11/26/2023)  Social Connections: Socially Isolated (11/26/2023)  Stress: No Stress Concern Present (11/26/2023)  Tobacco Use: High Risk (03/21/2024)   SDOH Interventions:     Readmission Risk Interventions    03/23/2024    3:12 PM  Readmission Risk Prevention Plan  Transportation Screening Complete  PCP or Specialist Appt within 5-7 Days Complete  Home Care Screening Complete  Medication Review (RN CM) Complete

## 2024-03-23 NOTE — Progress Notes (Signed)
 PROGRESS NOTE  Thomas [name] FMW:[id number] DOB: 08-18-1975 DOA: 03/20/2024 PCP: Berneta Elsie Sayre, MD   LOS: 3 days   Brief narrative:  Thomas [name] is an 48 y.o. male past medical history significant for psoriatic arthritis obesity and alcoholic cirrhosis, history of right ankle fracture with hardware and surgical repair in July 2024, recently discharged from the hospital in September 2025 for hepatic hydrothorax and underwent thoracocentesis at that time who comes in right ankle swelling and pain likely due to cellulitis in the setting of surgical hardware.   Assessment/Plan: Principal Problem:   Wound infection complicating hardware, initial encounter Active Problems:   Alcoholic cirrhosis of liver (HCC)   Class 3 severe obesity due to excess calories with body mass index (BMI) of 50.0 to 59.9 in adult (HCC)   Psoriatic arthritis (HCC)   Thrombocytopenia   Hydrothorax  Right ankle cellulitis with  wound infection complicating hardware, initial encounter Continue vancomycin and Rocephin .  Orthopedics was consulted and patient underwent washout and hardware removal.  Blood cultures are negative.  CRP of 4.6.  Blood cultures negative in 2 days.  Pending sensitivity from tissue cultures.  Infectious disease on board and likely plan for 4 to 6 weeks of antibiotics with PICC line..  Continue IV Toradol  oxycodone  and fentanyl  for pain management.   Alcoholic cirrhosis of liver with esophageal varices and portal hypertension: Alcohol cessation counseled.  Continue nadolol  Protonix  and lactulose.  Continue Aldactone  and Lasix .  Recurrent hepatic hydrothorax: Complains of mild dyspnea.  IR was consulted for thoracocentesis and 2.2 L of fluid was removed.  Repeat chest x-ray with improved but persistent right effusion.  Saturating well on room air.  Psoriatic arthritis: Steroids have been discontinued.   Class 3 severe obesity due to excess calories with body mass index (BMI) of 50.0  to 59.9 in adult  Counseling has been done.   Thrombocytopenia Likely secondary to alcohol abuse.  Latest platelet count of 112.     DVT prophylaxis: enoxaparin  (LOVENOX ) injection 40 mg Start: 03/22/24 0900 SCDs Start: 03/20/24 1148   Disposition: Likely will go home with home health  Status is: Inpatient Remains inpatient appropriate because: Pending clinical improvement, awaiting for sensitivity and antibiotics.    Code Status:     Code Status: Full Code  Family Communication: None at bedside  Consultants: Infectious disease Orthopedics  Procedures: Washout and hardware removal Thoracocentesis  Anti-infectives:  Rocephin  and vancomycin IV  Anti-infectives (From admission, onward)    Start     Dose/Rate Route Frequency Ordered Stop   03/23/24 1200  cefTRIAXone  (ROCEPHIN ) 2 g in sodium chloride  0.9 % 100 mL IVPB        2 g 200 mL/hr over 30 Minutes Intravenous Every 24 hours 03/22/24 1448     03/22/24 2100  vancomycin (VANCOCIN) IVPB 1000 mg/200 mL premix        1,000 mg 200 mL/hr over 60 Minutes Intravenous Every 12 hours 03/22/24 1006     03/21/24 1408  vancomycin (VANCOCIN) powder  Status:  Discontinued          As needed 03/21/24 1408 03/21/24 1507   03/21/24 1200  cefTRIAXone  (ROCEPHIN ) 1 g in sodium chloride  0.9 % 100 mL IVPB  Status:  Discontinued        1 g 200 mL/hr over 30 Minutes Intravenous Every 24 hours 03/20/24 1818 03/22/24 1448   03/21/24 0900  vancomycin (VANCOREADY) IVPB 1250 mg/250 mL  Status:  Discontinued  1,250 mg 166.7 mL/hr over 90 Minutes Intravenous Every 12 hours 03/20/24 1832 03/22/24 1006   03/20/24 1930  vancomycin (VANCOREADY) IVPB 2000 mg/400 mL        2,000 mg 200 mL/hr over 120 Minutes Intravenous STAT 03/20/24 1832 03/20/24 2143   03/20/24 1115  cefTRIAXone  (ROCEPHIN ) 2 g in sodium chloride  0.9 % 100 mL IVPB        2 g 200 mL/hr over 30 Minutes Intravenous  Once 03/20/24 1105 03/20/24 1259         Subjective: Today, patient was seen and examined at bedside.  Patient stated he could not sleep well and was sleeping in the morning.  Complains of abdominal distention and leg swelling. Feels tired and weak.  Complains of generalized weakness mild cough but overall shortness of breath has improved.  Objective: Vitals:   03/22/24 2220 03/23/24 0652  BP: 127/62 138/80  Pulse: 76 79  Resp: 18 18  Temp: 97.9 F (36.6 C) 97.7 F (36.5 C)  SpO2: 96% 96%    Intake/Output Summary (Last 24 hours) at 03/23/2024 1327 Last data filed at 03/23/2024 1100 Gross per 24 hour  Intake 1470.17 ml  Output 700 ml  Net 770.17 ml   Filed Weights   03/21/24 1210  Weight: 119.7 kg   Body mass index is 43.93 kg/m.   Physical Exam: GENERAL: Patient is alert awake and oriented. Not in obvious distress.  Obese  HENT: No scleral pallor or icterus. Pupils equally reactive to light. Oral mucosa is moist NECK: is supple, no gross swelling noted. CHEST: Diminished breath sounds bilaterally.. CVS: S1 and S2 heard, no murmur. Regular rate and rhythm.  ABDOMEN: Soft, non-tender, bowel sounds are present.  Distended with mild ascites. EXTREMITIES: Bilateral lower extremity edema noted CNS: Cranial nerves are intact. No focal motor deficits. SKIN: warm and dry without rashes.  Data Review: I have personally reviewed the following laboratory data and studies,  CBC: Recent Labs  Lab 03/20/24 0600 03/21/24 0427  WBC 13.3* 10.5  NEUTROABS 9.9*  --   HGB 13.3 11.9*  HCT 41.4 36.4*  MCV 99.8 100.6*  PLT 137* 112*   Basic Metabolic Panel: Recent Labs  Lab 03/20/24 0730 03/21/24 0427 03/22/24 0826 03/23/24 0326  NA 132* 134* 131* 133*  K 4.3 4.7 4.3 3.9  CL 96* 97* 95* 98  CO2 29 32 29 27  GLUCOSE 90 87 127* 111*  BUN 22* 22* 28* 26*  CREATININE 0.68 0.90 0.98 0.90  CALCIUM 9.0 8.6* 8.7* 8.6*   Liver Function Tests: Recent Labs  Lab 03/20/24 0730 03/21/24 0427 03/22/24 0826  03/23/24 0326  AST 117* 124* 78* 116*  ALT 123* 124* 104* 114*  ALKPHOS 264* 250* 237* 255*  BILITOT 4.6* 4.8* 3.9* 3.0*  PROT 6.9 6.0* 6.6 6.0*  ALBUMIN  2.5* 2.3* 2.5* 2.4*   No results for input(s): LIPASE, AMYLASE in the last 168 hours. Recent Labs  Lab 03/20/24 1407  AMMONIA 90*   Cardiac Enzymes: No results for input(s): CKTOTAL, CKMB, CKMBINDEX, TROPONINI in the last 168 hours. BNP (last 3 results) Recent Labs    02/20/24 1723 03/09/24 1351  BNP 33.0 30.6    ProBNP (last 3 results) No results for input(s): PROBNP in the last 8760 hours.  CBG: No results for input(s): GLUCAP in the last 168 hours. Recent Results (from the past 240 hours)  Surgical pcr screen     Status: None   Collection Time: 03/21/24  4:42 AM   Specimen:  Nasal Mucosa; Nasal Swab  Result Value Ref Range Status   MRSA, PCR NEGATIVE NEGATIVE Final   Staphylococcus aureus NEGATIVE NEGATIVE Final    Comment: (NOTE) The Xpert SA Assay (FDA approved for NASAL specimens in patients 72 years of age and older), is one component of a comprehensive surveillance program. It is not intended to diagnose infection nor to guide or monitor treatment. Performed at [facility], [address] [place], [place] [address]   Culture, blood (Routine X 2) w Reflex to ID Panel     Status: None (Preliminary result)   Collection Time: 03/21/24  7:18 AM   Specimen: BLOOD RIGHT ARM  Result Value Ref Range Status   Specimen Description   Final    BLOOD RIGHT ARM Performed at [facility], [address], [place], [place] [address]    Special Requests   Final    BOTTLES DRAWN AEROBIC AND ANAEROBIC Blood Culture adequate volume Performed at [facility], [address], [place], [place] [address]    Culture   Final    NO GROWTH 2 DAYS Performed at [facility], [address], [place], [place] [address]    Report Status PENDING  Incomplete   Culture, blood (Routine X 2) w Reflex to ID Panel     Status: None (Preliminary result)   Collection Time: 03/21/24  7:18 AM   Specimen: BLOOD RIGHT HAND  Result Value Ref Range Status   Specimen Description   Final    BLOOD RIGHT HAND Performed at [facility], [address] [place], [place] [address]    Special Requests   Final    BOTTLES DRAWN AEROBIC AND ANAEROBIC Blood Culture adequate volume Performed at [facility], [address], [place], [place] [address]    Culture   Final    NO GROWTH 2 DAYS Performed at [facility], [address], [place], [place] [address]    Report Status PENDING  Incomplete  Aerobic/Anaerobic Culture w Gram Stain (surgical/deep wound)     Status: None (Preliminary result)   Collection Time: 03/21/24  2:11 PM   Specimen: Bone; Tissue  Result Value Ref Range Status   Specimen Description   Final    BONE Performed at [facility], [address], [place], [place] [address]    Special Requests   Final    NONE Performed at [facility], [address] [place], [place] [address]    Gram Stain   Final    NO WBC SEEN NO ORGANISMS SEEN Performed at [facility], [address], [place], [place] [address]    Culture   Final    FEW STAPHYLOCOCCUS AUREUS CONFIRMATION OF SUSCEPTIBILITIES IN PROGRESS NO ANAEROBES ISOLATED; CULTURE IN PROGRESS FOR 5 DAYS    Report Status PENDING  Incomplete     Studies: DG CHEST PORT 1 VIEW Result Date: 03/22/2024 EXAM: 1 VIEW(S) XRAY OF THE CHEST 03/22/2024 10:11:00 PM COMPARISON: Chest x-ray 03/20/2024. CLINICAL HISTORY: Status post PICC central line placement. FINDINGS: LINES, TUBES AND DEVICES: Interval placement of a right PICC with tip overlying the distal superior vena cava. LUNGS AND PLEURA: Low lung volumes, grossly stable to maybe slightly increased. Moderate to large volume right pleural effusion. Underlying airspace opacity of  the right upper lobe. Left lung is clear. No left pleural effusion. No pneumothorax. No pulmonary edema. HEART AND MEDIASTINUM: Unchanged  cardiac and mediastinal  silhouettes. BONES AND SOFT TISSUES: No acute osseous abnormality. IMPRESSION: 1. Interval placement of a right PICC with tip overlying the distal superior vena cava. 2. Moderate to large right pleural effusion, grossly stable to slightly increased. Underlying right upper lobe airspace opacity. Electronically signed by: Morgane Naveau MD 03/22/2024 10:18 PM EDT RP Workstation: [id number]   US  EKG SITE RITE Result Date: 03/22/2024 If Site Rite image not attached, placement could not be confirmed due to current cardiac rhythm.  DG Ankle 2 Views Right Result Date: 03/21/2024 CLINICAL DATA:  [id number] Surgery, elective [phone] Intraoperative right ankle EXAM: RIGHT ANKLE - 2 VIEW COMPARISON:  Radiographs 03/20/2024 and 03/13/2024. FINDINGS: C-arm fluoroscopy was provided in the operating room without the presence of a radiologist.1 second fluoroscopy time. 0.0245 mGy air kerma. Two C-arm fluoroscopic images were obtained intraoperatively and are submitted for post operative interpretation. Images demonstrate interval removal of the lateral fibular plate and screws. Cannulated screws remain within the distal fibular diaphysis and the medial malleolus. No evidence of acute fracture or dislocation. Surgical sponges are within the surgical bed on this intraoperative image. Please see intraoperative findings for further detail. IMPRESSION: Intraoperative fluoroscopic guidance for hardware removal. Electronically Signed   By: Elsie Perone M.D.   On: 03/21/2024 19:40   DG C-Arm 1-60 Min-No Report Result Date: 03/21/2024 Fluoroscopy was utilized by the requesting physician.  No radiographic interpretation.      Thomas Funderburke, MD  [facility] 03/23/2024  If 7PM-7AM, please contact night-coverage

## 2024-03-23 NOTE — Progress Notes (Signed)
 Physical Therapy Treatment Patient Details Name: Thomas [name] MRN: [id number] DOB: Oct 06, 1975 Today's Date: 03/23/2024   History of Present Illness 48 y.o. male who comes in right ankle swelling and pain likely due to cellulitis in the setting of surgical hardware. S/p R ankle I&D for osteomyelitis and hardware removal 03/21/24. Past medical history significant for psoriatic arthritis obesity and alcoholic cirrhosis, history of right ankle fracture with hardware and surgical repair in July 2024, recently discharged from the hospital in September 2025 for hepatic hydrothorax underwent thoracocentesis at that time    PT Comments  POD # 2 R ankle I&D with Hardware Removal AxO x 3 pleasant and motivated.  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  Pt was IND/working/driving. Assisted OOB to amb around the room was limited by increased c/o pain and fatigue.  Pt self able to rise from elevated.  Good safety cognition/awareness. General Gait Details: decreased amb distance this session due to increased pain.  I think I did too much yesterday.  Amb around room with walker NWBing R LE.  Static standing at sink to brush his teeth.  BSC placed behind Pt as a precaution to sit when fatigued. While seated on window bench, Pt was able to perform some R LE TE's of LAQ's and seated hip flexion.  Assisted Pt to sink per request to brush teeth.  Static standing at sink leaning on sink for support but I did place a covered BSC behind him in case he became fatigued.  Notified NT of such and to check on him and assist him back to bed when he was done.  Reported to RN, Pt's request for pain meds. Pt would benefit from an aggressive Rehab Program such as CIR before returning home.  Will consult and Update LPT Pt's request.     If plan is discharge home, recommend the following: A little help with bathing/dressing/bathroom;Assistance with cooking/housework;Assist for transportation;Help with stairs or ramp for entrance   Can  travel by private vehicle     Yes  Equipment Recommendations       Recommendations for Other Services       Precautions / Restrictions Precautions Precautions: Fall Restrictions Weight Bearing Restrictions Per Provider Order: Yes RLE Weight Bearing Per Provider Order: Non weight bearing     Mobility  Bed Mobility Overal bed mobility: Modified Independent             General bed mobility comments: self able and knowledable to elevate R LE    Transfers Overall transfer level: Needs assistance Equipment used: Rolling walker (2 wheels), None Transfers: Sit to/from Stand Sit to Stand: Contact guard assist           General transfer comment: VCs hand placement, increased time/effort, STS from eleveated bed    Ambulation/Gait Ambulation/Gait assistance: Contact guard assist Gait Distance (Feet): 24 Feet Assistive device: Rolling walker (2 wheels) Gait Pattern/deviations: Step-to pattern Gait velocity: decreased     General Gait Details: decreased amb distance this session due to increased pain.  I think I did too much yesterday.  Amb around room with walker NWBing R LE.  Static standing at sink to brush his teeth.  BSC placed behind Pt as a precaution to sit when fatigued.   Stairs         General stair comments: unable to attempt any stair training this session due to increased c/o pain.   Wheelchair Mobility     Tilt Bed    Modified Rankin (Stroke Patients Only)  Balance                                            Communication    Cognition Arousal: Alert Behavior During Therapy: WFL for tasks assessed/performed   PT - Cognitive impairments: No apparent impairments                       PT - Cognition Comments: AxO x 3 pleasant and motivated.  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  Pt was IND/working/driving. Following commands: Intact      Cueing Cueing Techniques: Verbal cues  Exercises      General  Comments        Pertinent Vitals/Pain Pain Assessment Pain Assessment: 0-10 Pain Score: 7  Pain Location: R ankle Pain Descriptors / Indicators: Throbbing Pain Intervention(s): Monitored during session, Repositioned, Patient requesting pain meds-RN notified    Home Living                          Prior Function            PT Goals (current goals can now be found in the care plan section) Progress towards PT goals: Progressing toward goals    Frequency    Min 3X/week      PT Plan      Co-evaluation              AM-PAC PT 6 Clicks Mobility   Outcome Measure  Help needed turning from your back to your side while in a flat bed without using bedrails?: None Help needed moving from lying on your back to sitting on the side of a flat bed without using bedrails?: None Help needed moving to and from a bed to a chair (including a wheelchair)?: A Little Help needed standing up from a chair using your arms (e.g., wheelchair or bedside chair)?: A Little Help needed to walk in hospital room?: A Little Help needed climbing 3-5 steps with a railing? : Total 6 Click Score: 18    End of Session Equipment Utilized During Treatment: Gait belt Activity Tolerance: Patient limited by fatigue;Patient limited by pain Patient left: in chair;Other (comment) (NT notified Pt was sink brushing his teeth) Nurse Communication: Mobility status PT Visit Diagnosis: Difficulty in walking, not elsewhere classified (R26.2);Pain Pain - Right/Left: Right Pain - part of body: Ankle and joints of foot     Time: 1430-1456 PT Time Calculation (min) (ACUTE ONLY): 26 min  Charges:    $Gait Training: 8-22 mins $Therapeutic Activity: 8-22 mins PT General Charges $$ ACUTE PT VISIT: 1 Visit                     Katheryn Leap  PTA Acute  Rehabilitation Services Office M-F          [phone]

## 2024-03-23 NOTE — Plan of Care (Signed)

## 2024-03-23 NOTE — Progress Notes (Signed)
 [facility]    Date of Admission:  03/20/2024   Total days of antibiotics 4   ID: KARIS EMIG is a 48 y.o. male with  Principal Problem:   Wound infection complicating hardware, initial encounter Active Problems:   Alcoholic cirrhosis of liver (HCC)   Class 3 severe obesity due to excess calories with body mass index (BMI) of 50.0 to 59.9 in adult (HCC)   Psoriatic arthritis (HCC)   Thrombocytopenia   Hydrothorax    Subjective: Picc line placed without difficulty. He reports more than 3 BM yesterday, he reports that they are giving him more lactulose than he usually takes.  Medications:   Chlorhexidine  Gluconate Cloth  6 each Topical Daily   enoxaparin  (LOVENOX ) injection  40 mg Subcutaneous Q24H   furosemide   40 mg Oral Daily   lactulose  30 g Oral TID   loteprednol  1 drop Both Eyes QID   nadolol   20 mg Oral Daily   pantoprazole   40 mg Oral BID   sodium chloride  flush  10-40 mL Intracatheter Q12H   spironolactone   25 mg Oral Daily    Objective: Vital signs in last 24 hours: Temp:  [97.7 F (36.5 C)-97.9 F (36.6 C)] 97.7 F (36.5 C) (10/15 9347) Pulse Rate:  [70-79] 79 (10/15 0652) Resp:  [18-19] 18 (10/15 0652) BP: (127-138)/(62-80) 138/80 (10/15 0652) SpO2:  [96 %] 96 % (10/15 9347)  Physical Exam  Constitutional: He is oriented to person, place, and time. He appears well-developed and well-nourished. No distress.  HENT:  Mouth/Throat: Oropharynx is clear and moist. No oropharyngeal exudate.  Cardiovascular: Normal rate, regular rhythm and normal heart sounds. Exam reveals no gallop and no friction rub.  No murmur heard.  Pulmonary/Chest: Effort normal and breath sounds normal. No respiratory distress. He has no wheezes.  Zku:mphyu leg wrapped Skin: Skin is warm and dry. No rash noted. No erythema.  Psychiatric: He has a normal mood and affect. His behavior is normal.    Lab Results Recent Labs    03/21/24 0427  03/22/24 0826 03/23/24 0326  WBC 10.5  --   --   HGB 11.9*  --   --   HCT 36.4*  --   --   NA 134* 131* 133*  K 4.7 4.3 3.9  CL 97* 95* 98  CO2 32 29 27  BUN 22* 28* 26*  CREATININE 0.90 0.98 0.90   Liver Panel Recent Labs    03/22/24 0826 03/23/24 0326  PROT 6.6 6.0*  ALBUMIN  2.5* 2.4*  AST 78* 116*  ALT 104* 114*  ALKPHOS 237* 255*  BILITOT 3.9* 3.0*   Sedimentation Rate Recent Labs    03/20/24 1835  ESRSEDRATE 39*   C-Reactive Protein Recent Labs    03/20/24 1835  CRP 4.6*    Microbiology: 10/13: tissue --staph aureus, sensi pending 10/13: blood cx ngtd Studies/Results: DG CHEST PORT 1 VIEW Result Date: 03/22/2024 EXAM: 1 VIEW(S) XRAY OF THE CHEST 03/22/2024 10:11:00 PM COMPARISON: Chest x-ray 03/20/2024. CLINICAL HISTORY: Status post PICC central line placement. FINDINGS: LINES, TUBES AND DEVICES: Interval placement of a right PICC with tip overlying the distal superior vena cava. LUNGS AND PLEURA: Low lung volumes, grossly stable to maybe slightly increased. Moderate to large volume right pleural effusion. Underlying airspace opacity of the right upper lobe. Left lung is clear. No left pleural effusion. No pneumothorax. No pulmonary edema. HEART AND MEDIASTINUM: Unchanged  cardiac and mediastinal silhouettes. BONES AND SOFT  TISSUES: No acute osseous abnormality. IMPRESSION: 1. Interval placement of a right PICC with tip overlying the distal superior vena cava. 2. Moderate to large right pleural effusion, grossly stable to slightly increased. Underlying right upper lobe airspace opacity. Electronically signed by: Morgane Naveau MD 03/22/2024 10:18 PM EDT RP Workstation: [id number]   US  EKG SITE RITE Result Date: 03/22/2024 If Site Rite image not attached, placement could not be confirmed due to current cardiac rhythm.  DG Ankle 2 Views Right Result Date: 03/21/2024 CLINICAL DATA:  [id number] Surgery, elective [phone] Intraoperative right ankle EXAM: RIGHT ANKLE - 2  VIEW COMPARISON:  Radiographs 03/20/2024 and 03/13/2024. FINDINGS: C-arm fluoroscopy was provided in the operating room without the presence of a radiologist.1 second fluoroscopy time. 0.0245 mGy air kerma. Two C-arm fluoroscopic images were obtained intraoperatively and are submitted for post operative interpretation. Images demonstrate interval removal of the lateral fibular plate and screws. Cannulated screws remain within the distal fibular diaphysis and the medial malleolus. No evidence of acute fracture or dislocation. Surgical sponges are within the surgical bed on this intraoperative image. Please see intraoperative findings for further detail. IMPRESSION: Intraoperative fluoroscopic guidance for hardware removal. Electronically Signed   By: Elsie Perone M.D.   On: 03/21/2024 19:40   DG C-Arm 1-60 Min-No Report Result Date: 03/21/2024 Fluoroscopy was utilized by the requesting physician.  No radiographic interpretation.   Lab Results  Component Value Date   ESRSEDRATE 39 (H) 03/20/2024     Assessment/Plan: Right ankle osteomyelitis, including staph aureus = currently on vancomycin and ceftriaxone  2gm IV. Awaiting sensitivities to narrow abtx regimen. Anticipate 6 wk of iv abtx  Dressing to RLE per surgery  Transaminitis with associated ETOH cirrhosis= will check hep b and hep a serology today to see if needs further vaccination. AST/ALT appears at baseline. -continue with diuretics  Hepatic encephalopathy = continue on lactulose, but recommend to decrease dose to titrate to 3 BM per day  Leukocytosis = improving   evaluation of this patient requires complex antimicrobial therapy evaluation and counseling and isolation needs for disease transmission risk assessment and mitigation.   [facility] Pager: [phone]  03/23/2024, 10:26 AM

## 2024-03-23 NOTE — Progress Notes (Signed)
   Inpatient Rehab Admissions Coordinator :  Per therapy change in recommendations, patient was screened for CIR candidacy by Heron Leavell RN MSN.  At this time patient appears to be a potential candidate for CIR. I will place a rehab consult per protocol for full assessment.  Noted at [facility] 2024 and went home at [facility] I wheelchair level. Please call me with any questions.  Heron Leavell RN MSN Admissions Coordinator [phone]

## 2024-03-23 NOTE — NC FL2 (Signed)
 [place]  MEDICAID FL2 LEVEL OF CARE FORM     IDENTIFICATION  Patient Name: Thomas [name] Birthdate: 06-24-1975 Sex: male Admission Date (Current Location): 03/20/2024  [facility] and [place] Number:  Producer, television/film/video and Address:  [facility],  [address] [place], [place] [address]      Provider Number: [id number]  Attending Physician Name and Address:  Sonjia Held, MD  Relative Name and Phone Number:       Current Level of Care: Hospital Recommended Level of Care: Skilled Nursing Facility Prior Approval Number:    Date Approved/Denied:   PASRR Number: [id number]  Discharge Plan: SNF    Current Diagnoses: Patient Active Problem List   Diagnosis Date Noted   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   Wound infection complicating hardware, initial encounter 03/20/2024   High risk medication use 02/29/2024   Pleural effusion associated with hepatic disorder 02/20/2024   Cellulitis of abdominal wall 03/10/2023   Esophageal varices without bleeding (HCC) 02/13/2023   History of ETOH abuse 01/09/2023   Ankle fracture 01/07/2023   Localized edema 01/07/2023   Closed right ankle fracture 01/06/2023   Thrombocytopenia 01/06/2023   Cirrhosis of liver (HCC) 12/25/2022   Secondary esophageal varices with bleeding (HCC) 12/25/2022   Reactive airway disease 07/28/2022   Acute upper GI bleed 07/05/2022   Hematemesis with nausea 07/05/2022   Melena 07/05/2022   Pharyngitis 10/22/2021   Psoriatic arthritis (HCC) 10/03/2021   Viral upper respiratory tract infection 10/03/2021   Acute blood loss anemia 08/26/2021   Screening for tuberculosis 08/15/2021   Elevated BP without diagnosis of hypertension 06/14/2021   Need for immunization against influenza 06/14/2021   Alcoholic cirrhosis of liver (HCC) 09/26/2019   Class 3 severe obesity due to excess calories with body mass index (BMI) of 50.0 to 59.9 in adult [facility]) 09/26/2019   Healthcare maintenance 02/07/2019   Psoriasis  02/07/2019   Vitamin D  deficiency 05/07/2016   OSA (obstructive sleep apnea) 06/23/2013   Insomnia 06/16/2013   Alcohol use disorder 12/25/2011   Scrotal varices, 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   Alcoholic fatty liver 01/16/2010   Allergic rhinitis 10/12/2007    Orientation RESPIRATION BLADDER Height & Weight     Self, Time, Situation, Place  Normal Continent Weight: 264 lb (119.7 kg) Height:  5' 5 (165.1 cm)  BEHAVIORAL SYMPTOMS/MOOD NEUROLOGICAL BOWEL NUTRITION STATUS      Continent Diet (regular)  AMBULATORY STATUS COMMUNICATION OF NEEDS Skin   Extensive Assist Verbally Surgical wounds                       Personal Care Assistance Level of Assistance  Bathing, Feeding Bathing Assistance: Limited assistance Feeding assistance: Independent       Functional Limitations Info  Sight, Hearing, Speech Sight Info: Adequate Hearing Info: Adequate Speech Info: Adequate    SPECIAL CARE FACTORS FREQUENCY  PT (By licensed PT), OT (By licensed OT)     PT Frequency: 5x/wk OT Frequency: 5x/wk            Contractures Contractures Info: Not present    Additional Factors Info  Code Status, Allergies Code Status Info: Full Allergies Info: Codeine           Current Medications (03/23/2024):  This is the current hospital active medication list Current Facility-Administered Medications  Medication Dose Route Frequency Provider Last Rate Last Admin   albuterol  (PROVENTIL ) (2.5 MG/3ML) 0.083% nebulizer solution 2.5 mg  2.5 mg Nebulization Q2H PRN Cockerham, Alicia M, PA-C  artificial tears ophthalmic solution 1 drop  1 drop Both Eyes PRN Odell Celinda Balo, MD   1 drop at 03/22/24 0602   cefTRIAXone  (ROCEPHIN ) 2 g in sodium chloride  0.9 % 100 mL IVPB  2 g Intravenous Q24H [name], MD 200 mL/hr at 03/23/24 1116 2 g at 03/23/24 1116   Chlorhexidine  Gluconate Cloth 2 % PADS 6 each  6 each Topical Daily Odell Celinda Balo, MD   6 each at 03/23/24 0929   enoxaparin   (LOVENOX ) injection 40 mg  40 mg Subcutaneous Q24H Cockerham, Alicia M, PA-C   40 mg at 03/23/24 9073   fentaNYL  (SUBLIMAZE ) injection 12.5-50 mcg  12.5-50 mcg Intravenous Q2H PRN Cockerham, Alicia M, PA-C   25 mcg at 03/21/24 2350   furosemide  (LASIX ) tablet 40 mg  40 mg Oral Daily Cockerham, Alicia M, PA-C   40 mg at 03/23/24 9071   ketorolac  (TORADOL ) 30 MG/ML injection 30 mg  30 mg Intravenous Q6H PRN Odell Celinda Balo, MD   30 mg at 03/22/24 2310   lactulose (CHRONULAC) 10 GM/15ML solution 30 g  30 g Oral TID Cockerham, Alicia M, PA-C   30 g at 03/22/24 1536   loteprednol (LOTEMAX) 0.5 % ophthalmic suspension 1 drop  1 drop Both Eyes QID Cockerham, Alicia M, PA-C   1 drop at 03/22/24 2303   methocarbamol  (ROBAXIN ) injection 500 mg  500 mg Intravenous Q8H PRN Cockerham, Alicia M, PA-C   500 mg at 03/22/24 2309   nadolol  (CORGARD ) tablet 20 mg  20 mg Oral Daily Cockerham, Alicia M, PA-C   20 mg at 03/23/24 9072   ondansetron  (ZOFRAN ) tablet 4 mg  4 mg Oral Q6H PRN Cockerham, Alicia M, PA-C       Or   ondansetron  (ZOFRAN ) injection 4 mg  4 mg Intravenous Q6H PRN Cockerham, Alicia M, PA-C       oxyCODONE  (Oxy IR/ROXICODONE ) immediate release tablet 5 mg  5 mg Oral Q4H PRN Odell Celinda Balo, MD       pantoprazole  (PROTONIX ) EC tablet 40 mg  40 mg Oral BID Cockerham, Alicia M, PA-C   40 mg at 03/23/24 9071   sodium chloride  flush (NS) 0.9 % injection 10-40 mL  10-40 mL Intracatheter Q12H Odell Celinda Balo, MD   10 mL at 03/23/24 9062   sodium chloride  flush (NS) 0.9 % injection 10-40 mL  10-40 mL Intracatheter PRN Odell Celinda Balo, MD       spironolactone  (ALDACTONE ) tablet 25 mg  25 mg Oral Daily Cockerham, Alicia M, PA-C   25 mg at 03/23/24 9071   vancomycin (VANCOCIN) IVPB 1000 mg/200 mL premix  1,000 mg Intravenous Q12H Fobbs, Rodericks T, RPH 200 mL/hr at 03/23/24 0936 1,000 mg at 03/23/24 9063     Discharge Medications: Please see discharge summary for a list of discharge  medications.  Relevant Imaging Results:  Relevant Lab Results:   Additional Information SSN: [id number]  NORMAN ASPEN, LCSW

## 2024-03-23 NOTE — Plan of Care (Signed)
   Problem: Clinical Measurements: Goal: Will remain free from infection Outcome: Progressing Goal: Diagnostic test results will improve Outcome: Progressing

## 2024-03-24 ENCOUNTER — Inpatient Hospital Stay: Admitting: Family Medicine

## 2024-03-24 ENCOUNTER — Other Ambulatory Visit: Payer: Self-pay | Admitting: Family Medicine

## 2024-03-24 DIAGNOSIS — T847XXA Infection and inflammatory reaction due to other internal orthopedic prosthetic devices, implants and grafts, initial encounter: Secondary | ICD-10-CM | POA: Diagnosis not present

## 2024-03-24 DIAGNOSIS — M86171 Other acute osteomyelitis, right ankle and foot: Secondary | ICD-10-CM | POA: Diagnosis not present

## 2024-03-24 DIAGNOSIS — B9561 Methicillin susceptible Staphylococcus aureus infection as the cause of diseases classified elsewhere: Secondary | ICD-10-CM

## 2024-03-24 DIAGNOSIS — T8469XA Infection and inflammatory reaction due to internal fixation device of other site, initial encounter: Secondary | ICD-10-CM

## 2024-03-24 DIAGNOSIS — K7030 Alcoholic cirrhosis of liver without ascites: Secondary | ICD-10-CM | POA: Diagnosis not present

## 2024-03-24 LAB — CBC
HCT: 37.1 % — ABNORMAL LOW (ref 39.0–52.0)
Hemoglobin: 11.7 g/dL — ABNORMAL LOW (ref 13.0–17.0)
MCH: 32.1 pg (ref 26.0–34.0)
MCHC: 31.5 g/dL (ref 30.0–36.0)
MCV: 101.6 fL — ABNORMAL HIGH (ref 80.0–100.0)
Platelets: 161 K/uL (ref 150–400)
RBC: 3.65 MIL/uL — ABNORMAL LOW (ref 4.22–5.81)
RDW: 16.8 % — ABNORMAL HIGH (ref 11.5–15.5)
WBC: 10.7 K/uL — ABNORMAL HIGH (ref 4.0–10.5)
nRBC: 0 % (ref 0.0–0.2)

## 2024-03-24 LAB — MAGNESIUM: Magnesium: 2.1 mg/dL (ref 1.7–2.4)

## 2024-03-24 LAB — BASIC METABOLIC PANEL WITH GFR
Anion gap: 8 (ref 5–15)
BUN: 25 mg/dL — ABNORMAL HIGH (ref 6–20)
CO2: 26 mmol/L (ref 22–32)
Calcium: 8.7 mg/dL — ABNORMAL LOW (ref 8.9–10.3)
Chloride: 98 mmol/L (ref 98–111)
Creatinine, Ser: 1 mg/dL (ref 0.61–1.24)
GFR, Estimated: >60 mL/min (ref >60)
Glucose, Bld: 133 mg/dL — ABNORMAL HIGH (ref 70–99)
Potassium: 3.5 mmol/L (ref 3.5–5.1)
Sodium: 133 mmol/L — ABNORMAL LOW (ref 135–145)

## 2024-03-24 LAB — AMMONIA: Ammonia: 47 umol/L — ABNORMAL HIGH (ref 9–35)

## 2024-03-24 MED ORDER — ALUM & MAG HYDROXIDE-SIMETH 200-200-20 MG/5ML PO SUSP
30.0000 mL | Freq: Once | ORAL | Status: AC
Start: 2024-03-24 — End: 2024-03-24
  Administered 2024-03-24: 30 mL via ORAL
  Filled 2024-03-24: qty 30

## 2024-03-24 MED ORDER — ALUM & MAG HYDROXIDE-SIMETH 200-200-20 MG/5ML PO SUSP
30.0000 mL | ORAL | Status: DC | PRN
  Administered 2024-03-24 – 2024-03-26 (×4): 30 mL via ORAL
  Filled 2024-03-24 (×4): qty 30

## 2024-03-24 MED ORDER — CEFAZOLIN SODIUM-DEXTROSE 2-4 GM/100ML-% IV SOLN
2.0000 g | Freq: Three times a day (TID) | INTRAVENOUS | Status: DC
  Administered 2024-03-24 – 2024-03-30 (×19): 2 g via INTRAVENOUS
  Filled 2024-03-24 (×19): qty 100

## 2024-03-24 MED ORDER — CEFAZOLIN IV (FOR PTA / DISCHARGE USE ONLY): 2.0000 g | Freq: Three times a day (TID) | INTRAVENOUS | 0 refills | Status: DC

## 2024-03-24 MED ORDER — HEPATITIS B VAC RECOMB ADJ 20 MCG/0.5ML IM SOSY
0.5000 mL | PREFILLED_SYRINGE | Freq: Once | INTRAMUSCULAR | Status: AC
  Administered 2024-03-24: 0.5 mL via INTRAMUSCULAR
  Filled 2024-03-24: qty 0.5

## 2024-03-24 NOTE — Progress Notes (Signed)
 Inpatient Rehab Admissions:  Inpatient Rehab Consult received.  I spoke to patient on the telephone for rehabilitation assessment and to discuss goals and expectations of an inpatient rehab admission.  Dicussed average length of stay, insurance authorization requirement and discharge home after completion of CIR. Pt acknowledged understanding and is interested in pursuing CIR. Pt gave permission to contact sister Rosaline. Spoke with Rosaline on the phone. She also acknowledged understanding of CIR goals and expectations. She is supportive of pt pursuing CIR. She confirmed that pt will have intermittent support after discharge. Will continue to follow.  Signed: Tinnie Yvone Cohens, MS, CCC-SLP Admissions Coordinator [phone]

## 2024-03-24 NOTE — Progress Notes (Addendum)
 [facility]    Date of Admission:  03/20/2024   Total days of antibiotics 5   ID: Thomas [name] is a 48 y.o. male with right ankle MSSA HW infection/OM s/p HW removal Principal Problem:   Wound infection complicating hardware, initial encounter Active Problems:   Alcoholic cirrhosis of liver (HCC)   Class 3 severe obesity due to excess calories with body mass index (BMI) of 50.0 to 59.9 in adult (HCC)   Psoriatic arthritis (HCC)   Thrombocytopenia   Hydrothorax    Subjective: Less diarrhea since having reduction of lactulose dose. No fevers.   Medications:   Chlorhexidine  Gluconate Cloth  6 each Topical Daily   enoxaparin  (LOVENOX ) injection  40 mg Subcutaneous Q24H   furosemide   40 mg Oral Daily   lactulose  30 g Oral Daily   loteprednol  1 drop Both Eyes QID   nadolol   20 mg Oral Daily   pantoprazole   40 mg Oral BID   sodium chloride  flush  10-40 mL Intracatheter Q12H   spironolactone   25 mg Oral Daily    Objective: Vital signs in last 24 hours: Temp:  [98.3 F (36.8 C)-98.4 F (36.9 C)] 98.4 F (36.9 C) (10/16 1414) Pulse Rate:  [66-75] 75 (10/16 1414) Resp:  [16-18] 18 (10/16 1414) BP: (118-158)/(70-84) 158/84 (10/16 1414) SpO2:  [96 %] 96 % (10/16 1414)  Physical Exam  Constitutional: He is oriented to person, place, and time. He appears well-developed and well-nourished. No distress.  HENT:  Mouth/Throat: Oropharynx is clear and moist. No oropharyngeal exudate.  Cardiovascular: Normal rate, regular rhythm and normal heart sounds. Exam reveals no gallop and no friction rub.  No murmur heard.  Pulmonary/Chest: Effort normal and breath sounds normal. No respiratory distress. He has no wheezes.  Abdominal: Soft. Bowel sounds are normal. He exhibits no distension. There is no tenderness.  Zku:mphyu leg wrapped from surgery Neurological: He is alert and oriented to person, place, and time.  Skin: Skin is warm and dry. No rash noted.  No erythema.  Psychiatric: He has a normal mood and affect. His behavior is normal.    Lab Results Recent Labs    03/23/24 0326 03/24/24 0045  WBC  --  10.7*  HGB  --  11.7*  HCT  --  37.1*  NA 133* 133*  K 3.9 3.5  CL 98 98  CO2 27 26  BUN 26* 25*  CREATININE 0.90 1.00   Liver Panel Recent Labs    03/22/24 0826 03/23/24 0326  PROT 6.6 6.0*  ALBUMIN  2.5* 2.4*  AST 78* 116*  ALT 104* 114*  ALKPHOS 237* 255*  BILITOT 3.9* 3.0*   Lab Results  Component Value Date   ESRSEDRATE 39 (H) 03/20/2024    Microbiology: 10/13 Staphylococcus aureus      MIC    CIPROFLOXACIN <=0.5 SENSI... Sensitive    CLINDAMYCIN RESISTANT Resistant    ERYTHROMYCIN  >=8 RESISTANT Resistant    GENTAMICIN <=0.5 SENSI... Sensitive    Inducible Clindamycin POSITIVE Resistant    LINEZOLID 2 SENSITIVE Sensitive    OXACILLIN 0.5 SENSITIVE Sensitive    RIFAMPIN <=0.5 SENSI... Sensitive    TETRACYCLINE <=1 SENSITIVE Sensitive    TRIMETH/SULFA <=10 SENSIT... Sensitive    VANCOMYCIN 1 SENSITIVE Sensitive    Studies/Results: DG CHEST PORT 1 VIEW Result Date: 03/22/2024 EXAM: 1 VIEW(S) XRAY OF THE CHEST 03/22/2024 10:11:00 PM COMPARISON: Chest x-ray 03/20/2024. CLINICAL HISTORY: Status post PICC central line placement. FINDINGS: LINES,  TUBES AND DEVICES: Interval placement of a right PICC with tip overlying the distal superior vena cava. LUNGS AND PLEURA: Low lung volumes, grossly stable to maybe slightly increased. Moderate to large volume right pleural effusion. Underlying airspace opacity of the right upper lobe. Left lung is clear. No left pleural effusion. No pneumothorax. No pulmonary edema. HEART AND MEDIASTINUM: Unchanged  cardiac and mediastinal silhouettes. BONES AND SOFT TISSUES: No acute osseous abnormality. IMPRESSION: 1. Interval placement of a right PICC with tip overlying the distal superior vena cava. 2. Moderate to large right pleural effusion, grossly stable to slightly increased.  Underlying right upper lobe airspace opacity. Electronically signed by: Morgane Naveau MD 03/22/2024 10:18 PM EDT RP Workstation: [id number]   US  EKG SITE RITE Result Date: 03/22/2024 If Site Rite image not attached, placement could not be confirmed due to current cardiac rhythm.    Assessment/Plan: MSSA right ankle osteomyelitis s/p HW removal = will narrow abtx to cefazolin  2gm IV q 8hr.  Etoh cirrhosis , non-immune to hep B = will give hep b hepislav today; will give 2nd dose at follow up  Hepatic encephalopathy = continue on lactulose daily  --------------- Diagnosis: Right ankle osteomyelitis  Culture Result: MSSA  Allergies  Allergen Reactions   Codeine Other (See Comments)    Jittery     OPAT Orders Discharge antibiotics to be given via PICC line Discharge antibiotics: Per pharmacy protocol : cefazolin  2gm IV Q 8hr  Duration: 6 wk End Date: Nov 24th  [facility] Care Per Protocol:  Home health RN for IV administration and teaching; PICC line care and labs.    Labs weekly while on IV antibiotics: _x_ CBC with differential _x_ BMP __x CRP _x_ ESR  _x_ Please pull PIC at completion of IV antibiotics  Fax weekly labs to [phone]  Clinic Follow Up Appt: 5 wk  @ [facility]  Will sign off  evaluation of this patient requires complex antimicrobial therapy evaluation and counseling and isolation needs for disease transmission risk assessment and mitigation.     [facility] for Infect[facility] Diseases Pager: [phone]  03/24/2024, 2:30 PM

## 2024-03-24 NOTE — Progress Notes (Signed)
     Subjective:  Patient reportsthat he is doing okay. Notes some itching in the ankle. Pain tolerable. Cxs with staph pending sensitivities. No new issues.  Yesterday's total administered Morphine  Milligram Equivalents: 0   Objective:   VITALS:   Vitals:   03/23/24 0652 03/23/24 1421 03/23/24 2153 03/24/24 0534  BP: 138/80 130/69 119/70 118/75  Pulse: 79 69 66 69  Resp: 18 18 16 16   Temp: 97.7 F (36.5 C) 98.4 F (36.9 C) 98.3 F (36.8 C) 98.4 F (36.9 C)  TempSrc:  Oral Oral Oral  SpO2: 96% 97% 96% 96%  Weight:      Height:        Sensation intact distally Dorsiflexion/Plantar flexion intact Splint c/d/i  Lab Results  Component Value Date   WBC 10.7 (H) 03/24/2024   HGB 11.7 (L) 03/24/2024   HCT 37.1 (L) 03/24/2024   MCV 101.6 (H) 03/24/2024   PLT 161 03/24/2024   BMET    Component Value Date/Time   NA 133 (L) 03/24/2024 0045   K 3.5 03/24/2024 0045   CL 98 03/24/2024 0045   CO2 26 03/24/2024 0045   GLUCOSE 133 (H) 03/24/2024 0045   BUN 25 (H) 03/24/2024 0045   CREATININE 1.00 03/24/2024 0045   CREATININE 0.66 06/14/2021 1419   CALCIUM 8.7 (L) 03/24/2024 0045   GFRNONAA >60 03/24/2024 0045   GFRNONAA >89 06/22/2013 0958      Xray: Status post partial hardware removal no adverse features.  Assessment/Plan: 3 Days Post-Op   Principal Problem:   Wound infection complicating hardware, initial encounter Active Problems:   Alcoholic cirrhosis of liver (HCC)   Class 3 severe obesity due to excess calories with body mass index (BMI) of 50.0 to 59.9 in adult [facility])   Psoriatic arthritis (HCC)   Thrombocytopenia   Hydrothorax  S/p R ankle I&D for osteomyelitis and draining sinus 03/21/24  Post op recs: WB: NWB RLE in splint Antibiotics: Continue on broad-spectrum antibiotics, broad-spectrum antibiotics, antibiotic plan per infectious disease. Imaging: PACU xrays Dressing: keep splint intact until follow up DVT prophylaxis: Lovenox  in house,  discharged with aspirin 81mg  BID x4 weeks Follow up: 2 weeks after surgery for a wound check and suture removal with Dr. Edna at [facility].  Address: [address], [place], [place] [address]  Office Phone: [phone]    TORIBIO DELENA EDNA 03/24/2024, 7:57 AM   TORIBIO Edna, MD  Contact information:   [phone] 7am-5pm epic message Dr. Edna, or call office for patient follow up: [phone] After hours and holidays please check [email or web address] for group call information for [facility]

## 2024-03-24 NOTE — Progress Notes (Signed)
 PROGRESS NOTE  Thomas [name] FMW:[id number] DOB: 06/05/1976 DOA: 03/20/2024 PCP: Berneta Elsie Sayre, MD   LOS: 4 days   Brief narrative:  Thomas [name] is an 48 y.o. male past medical history significant for psoriatic arthritis obesity and alcoholic cirrhosis, history of right ankle fracture with hardware and surgical repair in July 2024, recently discharged from the hospital in September 2025 for hepatic hydrothorax and underwent thoracocentesis at that time who comes in right ankle swelling and pain likely due to cellulitis in the setting of surgical hardware.  At this time patient has undergone surgical intervention and is awaiting for CIR placement.  Assessment/Plan: Principal Problem:   Wound infection complicating hardware, initial encounter Active Problems:   Alcoholic cirrhosis of liver (HCC)   Class 3 severe obesity due to excess calories with body mass index (BMI) of 50.0 to 59.9 in adult (HCC)   Psoriatic arthritis (HCC)   Thrombocytopenia   Hydrothorax  Right ankle cellulitis with  wound infection complicating hardware, initial encounter Continue  cefazolin , orthopedics was consulted and patient underwent washout and hardware removal.  Blood cultures are negative.  CRP of 4.6.  Blood cultures negative in 3 days.  Deep tissue culture showing staph aureus sensitive to oxacillin.  Infectious disease on board and likely plan for 4 to 6 weeks of antibiotics with PICC line.  Continue IV Toradol  oxycodone  and fentanyl  for pain management.  Orthopedic has seen the patient and recommend nonweightbearing in the right lower extremity in the splint.  Recommend aspirin 81 twice daily for 4 weeks for DVT prophylaxis.  Plan is to keep the splint intact until follow-up with orthopedics in 2 weeks after surgery for wound check and suture removal.   Alcoholic cirrhosis of liver with esophageal varices and portal hypertension:   Continue nadolol  Protonix  and lactulose.  Continue Aldactone  and  Lasix .  Lactulose dose was decreased to once daily yesterday due to frequent bowel movement.  Will need to titrate to keep the bowel movements 2-3 times a day.  Recurrent hepatic hydrothorax: Complains of mild dyspnea.  IR was consulted for thoracocentesis and 2.2 L of fluid was removed.  Repeat chest x-ray with improved but persistent right effusion.  Saturating well on room air.  Not orthopneic.  Psoriatic arthritis: Steroids have been discontinued.   Class 3 severe obesity due to excess calories with body mass index (BMI) of 50.0 to 59.9 in adult  Would benefit from ongoing lifestyle modification as outpatient.   Thrombocytopenia Improved likely secondary to alcohol abuse.  Latest platelet count of 161  Debility deconditioning.  Physical therapy has seen the patient and recommended CIR     DVT prophylaxis: enoxaparin  (LOVENOX ) injection 40 mg Start: 03/22/24 0900 SCDs Start: 03/20/24 1148   Disposition: Inpatient rehab as per PT evaluation.  Status is: Inpatient Remains inpatient appropriate because: IV antibiotics  need for CIR.     Code Status:     Code Status: Full Code  Family Communication: None at bedside  Consultants: Infectious disease Orthopedics  Procedures: Right right ankle washout and hardware removal Thoracocentesis  Anti-infectives:  Cefazolin  IV  Anti-infectives (From admission, onward)    Start     Dose/Rate Route Frequency Ordered Stop   03/24/24 1400  ceFAZolin  (ANCEF ) IVPB 2g/100 mL premix        2 g 200 mL/hr over 30 Minutes Intravenous Every 8 hours 03/24/24 0938     03/24/24 0000  ceFAZolin  (ANCEF ) IVPB        2 g  Intravenous Every 8 hours 03/24/24 1123 05/02/24 2359   03/23/24 1200  cefTRIAXone  (ROCEPHIN ) 2 g in sodium chloride  0.9 % 100 mL IVPB  Status:  Discontinued        2 g 200 mL/hr over 30 Minutes Intravenous Every 24 hours 03/22/24 1448 03/24/24 0938   03/22/24 2100  vancomycin (VANCOCIN) IVPB 1000 mg/200 mL premix  Status:   Discontinued        1,000 mg 200 mL/hr over 60 Minutes Intravenous Every 12 hours 03/22/24 1006 03/24/24 0935   03/21/24 1408  vancomycin (VANCOCIN) powder  Status:  Discontinued          As needed 03/21/24 1408 03/21/24 1507   03/21/24 1200  cefTRIAXone  (ROCEPHIN ) 1 g in sodium chloride  0.9 % 100 mL IVPB  Status:  Discontinued        1 g 200 mL/hr over 30 Minutes Intravenous Every 24 hours 03/20/24 1818 03/22/24 1448   03/21/24 0900  vancomycin (VANCOREADY) IVPB 1250 mg/250 mL  Status:  Discontinued        1,250 mg 166.7 mL/hr over 90 Minutes Intravenous Every 12 hours 03/20/24 1832 03/22/24 1006   03/20/24 1930  vancomycin (VANCOREADY) IVPB 2000 mg/400 mL        2,000 mg 200 mL/hr over 120 Minutes Intravenous STAT 03/20/24 1832 03/20/24 2143   03/20/24 1115  cefTRIAXone  (ROCEPHIN ) 2 g in sodium chloride  0.9 % 100 mL IVPB        2 g 200 mL/hr over 30 Minutes Intravenous  Once 03/20/24 1105 03/20/24 1259        Subjective: Today, patient was seen and examined at bedside.  Patient stated he could not sleep well and was sleeping in the morning.  Complains of abdominal distention and leg swelling. Feels tired and weak.  Complains of generalized weakness mild cough but overall shortness of breath has improved.  Objective: Vitals:   03/23/24 2153 03/24/24 0534  BP: 119/70 118/75  Pulse: 66 69  Resp: 16 16  Temp: 98.3 F (36.8 C) 98.4 F (36.9 C)  SpO2: 96% 96%    Intake/Output Summary (Last 24 hours) at 03/24/2024 1127 Last data filed at 03/24/2024 1000 Gross per 24 hour  Intake 2340 ml  Output 2100 ml  Net 240 ml   Filed Weights   03/21/24 1210  Weight: 119.7 kg   Body mass index is 43.93 kg/m.   Physical Exam: GENERAL: Patient is alert awake and oriented. Not in obvious distress.  Obese  HENT: No scleral pallor or icterus. Pupils equally reactive to light. Oral mucosa is moist NECK: is supple, no gross swelling noted. CHEST: Diminished breath sounds  bilaterally.. CVS: S1 and S2 heard, no murmur. Regular rate and rhythm.  ABDOMEN: Soft, non-tender, bowel sounds are present.  Distended with mild ascites. EXTREMITIES: Bilateral lower extremity edema noted CNS: Cranial nerves are intact. No focal motor deficits. SKIN: warm and dry without rashes.  Data Review: I have personally reviewed the following laboratory data and studies,  CBC: Recent Labs  Lab 03/20/24 0600 03/21/24 0427 03/24/24 0045  WBC 13.3* 10.5 10.7*  NEUTROABS 9.9*  --   --   HGB 13.3 11.9* 11.7*  HCT 41.4 36.4* 37.1*  MCV 99.8 100.6* 101.6*  PLT 137* 112* 161   Basic Metabolic Panel: Recent Labs  Lab 03/20/24 0730 03/21/24 0427 03/22/24 0826 03/23/24 0326 03/24/24 0045  NA 132* 134* 131* 133* 133*  K 4.3 4.7 4.3 3.9 3.5  CL 96* 97* 95* 98 98  CO2 29 32 29 27 26   GLUCOSE 90 87 127* 111* 133*  BUN 22* 22* 28* 26* 25*  CREATININE 0.68 0.90 0.98 0.90 1.00  CALCIUM 9.0 8.6* 8.7* 8.6* 8.7*  MG  --   --   --   --  2.1   Liver Function Tests: Recent Labs  Lab 03/20/24 0730 03/21/24 0427 03/22/24 0826 03/23/24 0326  AST 117* 124* 78* 116*  ALT 123* 124* 104* 114*  ALKPHOS 264* 250* 237* 255*  BILITOT 4.6* 4.8* 3.9* 3.0*  PROT 6.9 6.0* 6.6 6.0*  ALBUMIN  2.5* 2.3* 2.5* 2.4*   No results for input(s): LIPASE, AMYLASE in the last 168 hours. Recent Labs  Lab 03/20/24 1407 03/24/24 0045  AMMONIA 90* 47*   Cardiac Enzymes: No results for input(s): CKTOTAL, CKMB, CKMBINDEX, TROPONINI in the last 168 hours. BNP (last 3 results) Recent Labs    02/20/24 1723 03/09/24 1351  BNP 33.0 30.6    ProBNP (last 3 results) No results for input(s): PROBNP in the last 8760 hours.  CBG: No results for input(s): GLUCAP in the last 168 hours. Recent Results (from the past 240 hours)  Surgical pcr screen     Status: None   Collection Time: 03/21/24  4:42 AM   Specimen: Nasal Mucosa; Nasal Swab  Result Value Ref Range Status   MRSA, PCR  NEGATIVE NEGATIVE Final   Staphylococcus aureus NEGATIVE NEGATIVE Final    Comment: (NOTE) The Xpert SA Assay (FDA approved for NASAL specimens in patients 77 years of age and older), is one component of a comprehensive surveillance program. It is not intended to diagnose infection nor to guide or monitor treatment. Performed at [facility], [address] [place], [place] [address]   Culture, blood (Routine X 2) w Reflex to ID Panel     Status: None (Preliminary result)   Collection Time: 03/21/24  7:18 AM   Specimen: BLOOD RIGHT ARM  Result Value Ref Range Status   Specimen Description   Final    BLOOD RIGHT ARM Performed at [facility], [address] [place], [place] [address]    Special Requests   Final    BOTTLES DRAWN AEROBIC AND ANAEROBIC Blood Culture adequate volume Performed at [facility], [address], [place], [place] [address]    Culture   Final    NO GROWTH 3 DAYS Performed at [facility], [address] [place], [place] [address]    Report Status PENDING  Incomplete  Culture, blood (Routine X 2) w Reflex to ID Panel     Status: None (Preliminary result)   Collection Time: 03/21/24  7:18 AM   Specimen: BLOOD RIGHT HAND  Result Value Ref Range Status   Specimen Description   Final    BLOOD RIGHT HAND Performed at [facility], [address] [place], [place] [address]    Special Requests   Final    BOTTLES DRAWN AEROBIC AND ANAEROBIC Blood Culture adequate volume Performed at [facility], [address], [place], [place] [address]    Culture   Final    NO GROWTH 3 DAYS Performed at [facility], [address], [place], [place] [address]    Report Status PENDING  Incomplete  Aerobic/Anaerobic Culture w Gram Stain (surgical/deep wound)     Status: None (Preliminary result)   Collection Time: 03/21/24  2:11 PM   Specimen: Bone; Tissue  Result Value Ref Range  Status    Specimen Description   Final    BONE Performed at [facility], [address], [place], [place] [address]    Special Requests   Final    NONE Performed at [facility], [address] [place], [place] [address]    Gram Stain   Final    NO WBC SEEN NO ORGANISMS SEEN Performed at [facility], [address] [place], [place] [address]    Culture   Final    FEW STAPHYLOCOCCUS AUREUS NO ANAEROBES ISOLATED; CULTURE IN PROGRESS FOR 5 DAYS    Report Status PENDING  Incomplete   Organism ID, Bacteria STAPHYLOCOCCUS AUREUS  Final      Susceptibility   Staphylococcus aureus - MIC*    CIPROFLOXACIN <=0.5 SENSITIVE Sensitive     ERYTHROMYCIN  >=8 RESISTANT Resistant     GENTAMICIN <=0.5 SENSITIVE Sensitive     OXACILLIN 0.5 SENSITIVE Sensitive     TETRACYCLINE <=1 SENSITIVE Sensitive     VANCOMYCIN 1 SENSITIVE Sensitive     TRIMETH/SULFA <=10 SENSITIVE Sensitive     CLINDAMYCIN RESISTANT Resistant     RIFAMPIN <=0.5 SENSITIVE Sensitive     Inducible Clindamycin POSITIVE Resistant     LINEZOLID 2 SENSITIVE Sensitive     * FEW STAPHYLOCOCCUS AUREUS     Studies: DG CHEST PORT 1 VIEW Result Date: 03/22/2024 EXAM: 1 VIEW(S) XRAY OF THE CHEST 03/22/2024 10:11:00 PM COMPARISON: Chest x-ray 03/20/2024. CLINICAL HISTORY: Status post PICC central line placement. FINDINGS: LINES, TUBES AND DEVICES: Interval placement of a right PICC with tip overlying the distal superior vena cava. LUNGS AND PLEURA: Low lung volumes, grossly stable to maybe slightly increased. Moderate to large volume right pleural effusion. Underlying airspace opacity of the right upper lobe. Left lung is clear. No left pleural effusion. No pneumothorax. No pulmonary edema. HEART AND MEDIASTINUM: Unchanged  cardiac and mediastinal silhouettes. BONES AND SOFT TISSUES: No acute osseous abnormality. IMPRESSION: 1. Interval placement of a right PICC with tip overlying the distal superior  vena cava. 2. Moderate to large right pleural effusion, grossly stable to slightly increased. Underlying right upper lobe airspace opacity. Electronically signed by: Morgane Naveau MD 03/22/2024 10:18 PM EDT RP Workstation: [id number]   US  EKG SITE RITE Result Date: 03/22/2024 If Site Rite image not attached, placement could not be confirmed due to current cardiac rhythm.     Vernal Alstrom, MD  [facility] 03/24/2024  If 7PM-7AM, please contact night-coverage

## 2024-03-24 NOTE — Progress Notes (Signed)
 PHARMACY CONSULT NOTE FOR:  OUTPATIENT  PARENTERAL ANTIBIOTIC THERAPY (OPAT)  Indication: Right Ankle Osteomyelitis Regimen: Cefazolin  2 g every 8 hours x 6 weeks End date: 05/02/2024  IV antibiotic discharge orders are pended. To discharging provider:  please sign these orders via discharge navigator,  Select New Orders & click on the button choice - Manage This Unsigned Work.     Thank you for allowing pharmacy to be a part of this patient's care.  R. Samual Satterfield, PharmD PGY-1 Acute Care Pharmacy Resident 03/24/2024 11:20 AM

## 2024-03-24 NOTE — Plan of Care (Signed)
   Problem: Education: Goal: Knowledge of General Education information will improve Description: Including pain rating scale, medication(s)/side effects and non-pharmacologic comfort measures Outcome: Progressing   Problem: Activity: Goal: Risk for activity intolerance will decrease Outcome: Progressing

## 2024-03-24 NOTE — PMR Pre-admission (Incomplete)
 PMR Admission Coordinator Pre-Admission Assessment  Patient: Thomas [name] is an 48 y.o., male MRN: [id number] DOB: 1976/04/28 Height: 5' 5 (165.1 cm) Weight: 119.7 kg  Insurance Information HMO:     PPO: yes     PCP:      IPA:      80/20:      OTHER:  PRIMARY: BCBS COMM      Policy#: [id number]      Subscriber: patient CM Name: Rudell Glaze      Phone#: [phone]     Fax#: [phone] Pre-Cert#: [id number]    Received insurance approval via fax on 03/25/24. Pt approved for 7 days from 03/25/24-03/31/24.  Employer:  Benefits:  Phone #: n/a-online at [email or web address]     Name:  Eff. Date: 12/08/23-06/08/9998     Deduct: $1,600 ($1,600 met)      Out of Pocket Max: $2,600 ($2,600 met)      Life Max: NA CIR: 90% coverage, 10% co-insurance      SNF: 90% coverage, 10% co-insurance Outpatient: 90% coverage     Co-Pay: 10% co-insurance Home Health: 90% coverage      Co-Pay: 10% co-insurance DME: 90% coverage     Co-Pay: 10% co-insurance Providers: in-network SECONDARY: [place] Medicaid Healthy Blue      Policy#: [id number]     Phone#: [phone]  Financial Counselor:       Phone#:   The "Data Collection Information Summary" for patients in Inpatient Rehabilitation Facilities with attached "Privacy Act Statement-Health Care Records" was provided and verbally reviewed with: Patient  Emergency Contact Information Contact Information     Name Relation Home Work Mobile   Cotati Sister   [phone]   Rightmyer,Gilberto Father [phone]  [phone]      Other Contacts     Name Relation Home Work Mobile   Betancourth,Manuel Relative   [phone]       Current Medical History  Patient Admitting Diagnosis: wound infection History of Present Illness: Pt is a 48 year old male with medical hx significant for: obstructive sleep apnea, alcoholism with alcoholic cirrhosis, iron deficiency anemia, morbid obesity, recurrent pleural effusion d/t hepatic disorders.  Recently hospitalized in September 2025 with right-sided hepatic hydrothorax. Underwent thoracentesis. Also found to be in decompensated liver cirrhosis. Also seen in ER with mild confusion. Pt presented to [facility] on 03/20/24 d/t right lateral ankle ulceration pain, swelling and redness x1 week. Pt also reported gradual onset SOB (worse when trying to walk around). Chest x-ray showed large right pleural effusion. Right ankle x-ray concerning for underlying infection and also showed interval progression of soft tissue edema and small joint effusion. Negative for acute osteomyelitis. Pt underwent thoracentesis, which yielded 2.2 L of hazy fluid, by Harman Rakers, PA-C on 03/20/24. Orthopedics consulted. Pt underwent right ankle hardware removal and I&D by Dr. Edna on 03/21/24. Blood cultures negative. Tissue cultures showed MSSA. Therapy evaluations completed and CIR recommended d/t pt's deficits in functional mobility.     Patient's medical record from [facility] has been reviewed by the rehabilitation admission coordinator and physician.  Past Medical History  Past Medical History:  Diagnosis Date   Acute conjunctivitis of both eyes 10/22/2021   Acute sinusitis 06/16/2013   Alcoholic cirrhosis (HCC)    Alcoholic fatty liver 01/16/2010   Needs final HBV and HAV vaccines on or after 10/25/2012    Alcoholism (HCC) 12/25/2011   Allergic rhinitis    Childhood asthma    Elevated transaminase level 06/10/2007   AST: 80  ALT: 136 in 8/11: Hepatitis A., B and C negative.    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    Sleep apnea     Has the patient had major surgery during 100 days prior to admission? Yes  Family History   family history includes Depression in his mother; Diabetes  in his father and another family member; Hypertension in his father; Liver disease in his father, mother, and sister.  Current Medications  Current Facility-Administered Medications:    albuterol  (PROVENTIL ) (2.5 MG/3ML) 0.083% nebulizer solution 2.5 mg, 2.5 mg, Nebulization, Q2H PRN, Cockerham, Alicia M, PA-C   alum & mag hydroxide-simeth (MAALOX/MYLANTA) 200-200-20 MG/5ML suspension 30 mL, 30 mL, Oral, Q4H PRN, Pokhrel, Laxman, MD, 30 mL at 03/25/24 1504   artificial tears ophthalmic solution 1 drop, 1 drop, Both Eyes, PRN, Odell Celinda Balo, MD, 1 drop at 03/22/24 0602   ceFAZolin  (ANCEF ) IVPB 2g/100 mL premix, 2 g, Intravenous, Q8H, [name], MD, Last Rate: 200 mL/hr at 03/25/24 1221, 2 g at 03/25/24 1221   Chlorhexidine  Gluconate Cloth 2 % PADS 6 each, 6 each, Topical, Daily, Odell Celinda Balo, MD, 6 each at 03/25/24 0827   enoxaparin  (LOVENOX ) injection 40 mg, 40 mg, Subcutaneous, Q24H, Cockerham, Alicia M, PA-C, 40 mg at 03/25/24 9157   fentaNYL  (SUBLIMAZE ) injection 12.5-50 mcg, 12.5-50 mcg, Intravenous, Q2H PRN, Cockerham, Alicia M, PA-C, 25 mcg at 03/21/24 2350   furosemide  (LASIX ) tablet 40 mg, 40 mg, Oral, Daily, Cockerham, Alicia M, PA-C, 40 mg at 03/25/24 0843   ketorolac  (TORADOL ) 30 MG/ML injection 30 mg, 30 mg, Intravenous, Q6H PRN, Odell Celinda Balo, MD, 30 mg at 03/24/24 1723   lactulose (CHRONULAC) 10 GM/15ML solution 30 g, 30 g, Oral, Daily, Pokhrel, Laxman, MD, 30 g at 03/25/24 0842   loteprednol (LOTEMAX) 0.5 % ophthalmic suspension 1 drop, 1 drop, Both Eyes, QID, Cockerham, Alicia M, PA-C, 1 drop at 03/24/24 0940   methocarbamol  (ROBAXIN ) injection 500 mg, 500 mg, Intravenous, Q8H PRN, Cockerham, Alicia M, PA-C, 500 mg at 03/24/24 1723   nadolol  (CORGARD ) tablet 20 mg, 20 mg, Oral, Daily, Cockerham, Alicia M, PA-C, 20 mg at 03/25/24 9157   ondansetron  (ZOFRAN ) tablet 4 mg, 4 mg, Oral, Q6H PRN **OR** ondansetron  (ZOFRAN ) injection 4 mg, 4 mg, Intravenous, Q6H  PRN, Cockerham, Alicia M, PA-C   oxyCODONE  (Oxy IR/ROXICODONE ) immediate release tablet 5 mg, 5 mg, Oral, Q4H PRN, Odell Celinda Balo, MD   pantoprazole  (PROTONIX ) EC tablet 40 mg, 40 mg, Oral, BID, Cockerham, Alicia M, PA-C, 40 mg at 03/25/24 9156   sodium chloride  flush (NS) 0.9 % injection 10-40 mL, 10-40 mL, Intracatheter, Q12H, Odell Celinda Balo, MD, 10 mL at 03/25/24 0827   sodium chloride  flush (NS) 0.9 % injection 10-40 mL, 10-40 mL, Intracatheter, PRN, Odell Celinda Balo, MD   spironolactone  (ALDACTONE ) tablet 25 mg, 25 mg, Oral, Daily, Cockerham, Alicia M, PA-C, 25 mg at 03/25/24 9156  Patients Current Diet:  Diet Order             Diet regular Room service appropriate? Yes; Fluid consistency: Thin  Diet effective now                   Precautions / Restrictions Precautions Precautions: Fall Restrictions Weight Bearing Restrictions Per Provider Order: Yes RLE  Weight Bearing Per Provider Order: Non weight bearing   Has the patient had 2 or more falls or a fall with injury in the past year? No  Prior Activity Level Community (5-7x/wk): gets out of house daily, drives, works  Prior Functional Level Self Care: Did the patient need help bathing, dressing, using the toilet or eating? Independent  Indoor Mobility: Did the patient need assistance with walking from room to room (with or without device)? Independent  Stairs: Did the patient need assistance with internal or external stairs (with or without device)? Independent  Functional Cognition: Did the patient need help planning regular tasks such as shopping or remembering to take medications? Independent  Patient Information Are you of Hispanic, Latino/a,or Spanish origin?: E. Yes, another Hispanic, Latino, or Spanish origin What is your race?: Z. None of the above Do you need or want an interpreter to communicate with a doctor or health care staff?: 0. No  Patient's Response To:  Health Literacy and  Transportation Is the patient able to respond to health literacy and transportation needs?: Yes Health Literacy - How often do you need to have someone help you when you read instructions, pamphlets, or other written material from your doctor or pharmacy?: Never In the past 12 months, has lack of transportation kept you from medical appointments or from getting medications?: No In the past 12 months, has lack of transportation kept you from meetings, work, or from getting things needed for daily living?: No  Home Assistive Devices / Equipment Home Equipment: Cane - single point, Tub bench, Agricultural consultant (2 wheels), Wheelchair - manual  Prior Device Use: Indicate devices/aids used by the patient prior to current illness, exacerbation or injury? None of the above (recently using walker d/t ankle swelling)  Current Functional Level Cognition  Orientation Level: Oriented X4    Extremity Assessment (includes Sensation/Coordination)  Upper Extremity Assessment: Right hand dominant, Overall WFL for tasks assessed  Lower Extremity Assessment: Defer to PT evaluation RLE Deficits / Details: ankle in splint, knee ext at least 3/5, sensation intact at toes, can wiggle toes, can march in seated position RLE: Unable to fully assess due to immobilization RLE Sensation: WNL RLE Coordination: WNL    ADLs  Overall ADL's : Needs assistance/impaired Eating/Feeding: Set up, Sitting Grooming: Wash/dry hands, Wash/dry face, Oral care, Minimal assistance, Standing Upper Body Bathing: Contact guard assist, Sitting Lower Body Bathing: Moderate assistance, Sit to/from stand Upper Body Dressing : Sitting, Contact guard assist Lower Body Dressing: Moderate assistance, Sit to/from stand Toilet Transfer: Contact guard assist, Minimal assistance, BSC/3in1, Rolling walker (2 wheels) Toileting- Clothing Manipulation and Hygiene: Contact guard assist, Sitting/lateral lean Functional mobility during ADLs: Contact  guard assist, Rolling walker (2 wheels), Minimal assistance General ADL Comments: min cues to plan set up due to NWB LE for placement of items for reach, seated rests needed during short standing intervals    Mobility  Overal bed mobility: Modified Independent General bed mobility comments: self able and knowledable to elevate R LE    Transfers  Overall transfer level: Needs assistance Equipment used: Rolling walker (2 wheels), None Transfers: Sit to/from Stand Sit to Stand: Contact guard assist Stand pivot transfers: Contact guard assist, From elevated surface General transfer comment: min cues for hand placement    Ambulation / Gait / Stairs / Wheelchair Mobility  Ambulation/Gait Ambulation/Gait assistance: Contact guard assist Gait Distance (Feet): 24 Feet Assistive device: Rolling walker (2 wheels) Gait Pattern/deviations: Step-to pattern General Gait Details: decreased amb distance this  session due to increased pain.  I think I did too much yesterday.  Amb around room with walker NWBing R LE.  Static standing at sink to brush his teeth.  BSC placed behind Pt as a precaution to sit when fatigued. Gait velocity: decreased General stair comments: unable to attempt any stair training this session due to increased c/o pain.    Posture / Balance Balance Overall balance assessment: Needs assistance Sitting-balance support: No upper extremity supported, Feet supported Sitting balance-Leahy Scale: Good Standing balance support: No upper extremity supported, During functional activity Standing balance-Leahy Scale: Fair    Special considerations/life events  Skin Ecchymosis: arm/left; Erythema/Redness: leg/right; Weeping: ankle/right; Wound: Infection: foot/anterior, right, lateral; Surgical Incision: ankle   Previous Home Environment (from acute therapy documentation) Living Arrangements: Parent Available Help at Discharge: Family, Available PRN/intermittently Type of Home:  House Home Layout: Two level, Bed/bath upstairs, 1/2 bath on main level, Other (Comment) (has been sleeping on the couch) Alternate Level Stairs-Rails: Right Alternate Level Stairs-Number of Steps: Flight Home Access: Stairs to enter Entrance Stairs-Rails: None (pt reports having ramp built) Secretary/administrator of Steps: 3 Bathroom Shower/Tub: Health visitor: Pharmacist, community: Yes How Accessible: Accessible via walker Home Care Services: Yes Type of Home Care Services: Home RN Additional Comments: ramp to be installed, dad lives with pt but dad is in hospital, so doesn't have help available, wants to rent hospital bed to use on main level; pt declined trial of knee scooter  Discharge Living Setting Plans for Discharge Living Setting: Patient's home Type of Home at Discharge: House Discharge Home Layout: Two level, 1/2 bath on main level Alternate Level Stairs-Rails: Left Alternate Level Stairs-Number of Steps: 2 flights with landing in between Discharge Home Access: Stairs to enter Entrance Stairs-Rails: None (pt reported having a ramp built) Secretary/administrator of Steps: 3 Discharge Bathroom Shower/Tub: Walk-in shower Discharge Bathroom Toilet: Standard Discharge Bathroom Accessibility: Yes How Accessible: Accessible via walker Does the patient have any problems obtaining your medications?: No  Social/Family/Support Systems Anticipated Caregiver: Rosaline Breaker, sister Anticipated Caregiver's Contact Information: [phone] Caregiver Availability: Intermittent Discharge Plan Discussed with Primary Caregiver: Yes Is Caregiver In Agreement with Plan?: Yes Does Caregiver/Family have Issues with Lodging/Transportation while Pt is in Rehab?: No  Goals Patient/Family Goal for Rehab: Mod I-Supervision: PT/OT Expected length of stay: 7-10 days Pt/Family Agrees to Admission and willing to participate: Yes Program Orientation Provided &  Reviewed with Pt/Caregiver Including Roles  & Responsibilities: Yes  Decrease burden of Care through IP rehab admission: NA  Possible need for SNF placement upon discharge: Not anticipated  Patient Condition: I have reviewed medical records from [facility], spoken with CM, and patient and family member. I discussed via phone for inpatient rehabilitation assessment.  Patient will benefit from ongoing PT and OT, can actively participate in 3 hours of therapy a day 5 days of the week, and can make measurable gains during the admission.  Patient will also benefit from the coordinated team approach during an Inpatient Acute Rehabilitation admission.  The patient will receive intensive therapy as well as Rehabilitation physician, nursing, social worker, and care management interventions.  Due to safety, skin/wound care, disease management, medication administration, pain management, and patient education the patient requires 24 hour a day rehabilitation nursing.  The patient is currently CGA with mobility and CGA-Mod A with basic ADLs.  Discharge setting and therapy post discharge at home with home health is anticipated.  Patient has agreed to participate in the  Acute Inpatient Rehabilitation Program and will admit tomorrow.  Preadmission Screen Completed By:  Tinnie SHAUNNA Yvone Delayne, 03/25/2024 4:24 PM ______________________________________________________________________   Discussed status with Dr. Babs on 03/25/24  at 4:24 PM and received approval for admission today.  Admission Coordinator:  Tinnie SHAUNNA Yvone Delayne, CCC-SLP, time 4:24 PM/Date 03/25/24    Assessment/Plan: Diagnosis: *** Does the need for close, 24 hr/day Medical supervision in concert with the patient's rehab needs make it unreasonable for this patient to be served in a less intensive setting? {yes_no_potentially:3041433} Co-Morbidities requiring supervision/potential complications: *** Due to {due un:6958565}, does the  patient require 24 hr/day rehab nursing? {yes_no_potentially:3041433} Does the patient require coordinated care of a physician, rehab nurse, PT, OT, and SLP to address physical and functional deficits in the context of the above medical diagnosis(es)? {yes_no_potentially:3041433} Addressing deficits in the following areas: {deficits:3041436} Can the patient actively participate in an intensive therapy program of at least 3 hrs of therapy 5 days a week? {yes_no_potentially:3041433} The potential for patient to make measurable gains while on inpatient rehab is {potential:3041437} Anticipated functional outcomes upon discharge from inpatient rehab: {functional outcomes:304600100} PT, {functional outcomes:304600100} OT, {functional outcomes:304600100} SLP Estimated rehab length of stay to reach the above functional goals is: *** Anticipated discharge destination: {anticipated dc setting:21604} 10. Overall Rehab/Functional Prognosis: {potential:3041437}   MD Signature: ***

## 2024-03-24 NOTE — Evaluation (Signed)
 Occupational Therapy Evaluation Patient Details Name: Thomas [name] MRN: [id number] DOB: 1975-12-05 Today's Date: 03/24/2024   History of Present Illness   48 y.o. male who comes in right ankle swelling and pain likely due to cellulitis in the setting of surgical hardware. S/p R ankle I&D for osteomyelitis and hardware removal 03/21/24. Past medical history significant for psoriatic arthritis obesity and alcoholic cirrhosis, history of right ankle fracture with hardware and surgical repair in July 2024, recently discharged from the hospital in September 2025 for hepatic hydrothorax underwent thoracocentesis at that time     Clinical Impressions PTA, patient lives a hoe and was completely independent with all aspects of mobility and A/IADL's including driving and working. Currently, patient presents with deficits outlined below (see OT Problem List for details) most significantly pain, decreased balance and activity tolerance impacting BADL's and functional mobility with NWB R LE. Patient open to all activity presented and motivated for recovery. Patient will benefit from intensive inpatient follow-up therapy, >3 hours/day. Patient requires continued Acute care hospital level OT services to progress safety and functional performance and allow for discharge.      If plan is discharge home, recommend the following:   A lot of help with walking and/or transfers;A lot of help with bathing/dressing/bathroom;Assistance with cooking/housework;Assist for transportation;Help with stairs or ramp for entrance     Functional Status Assessment   Patient has had a recent decline in their functional status and demonstrates the ability to make significant improvements in function in a reasonable and predictable amount of time.     Equipment Recommendations   None recommended by OT     Recommendations for Other Services   Rehab consult     Precautions/Restrictions   Precautions Precautions:  Fall Recall of Precautions/Restrictions: Intact Restrictions Weight Bearing Restrictions Per Provider Order: Yes RLE Weight Bearing Per Provider Order: Non weight bearing     Mobility Bed Mobility Overal bed mobility: Modified Independent             General bed mobility comments: self able and knowledable to elevate R LE    Transfers Overall transfer level: Needs assistance Equipment used: Rolling walker (2 wheels), None Transfers: Sit to/from Stand Sit to Stand: Contact guard assist Stand pivot transfers: Contact guard assist, From elevated surface         General transfer comment: min cues for hand placement      Balance Overall balance assessment: Needs assistance Sitting-balance support: No upper extremity supported, Feet supported Sitting balance-Leahy Scale: Good     Standing balance support: No upper extremity supported, During functional activity Standing balance-Leahy Scale: Fair                             ADL either performed or assessed with clinical judgement   ADL Overall ADL's : Needs assistance/impaired Eating/Feeding: Set up;Sitting   Grooming: Wash/dry hands;Wash/dry face;Oral care;Minimal assistance;Standing   Upper Body Bathing: Contact guard assist;Sitting   Lower Body Bathing: Moderate assistance;Sit to/from stand   Upper Body Dressing : Sitting;Contact guard assist   Lower Body Dressing: Moderate assistance;Sit to/from stand   Toilet Transfer: Contact guard assist;Minimal assistance;BSC/3in1;Rolling walker (2 wheels)   Toileting- Clothing Manipulation and Hygiene: Contact guard assist;Sitting/lateral lean       Functional mobility during ADLs: Contact guard assist;Rolling walker (2 wheels);Minimal assistance General ADL Comments: min cues to plan set up due to NWB LE for placement of items for reach, seated rests needed  during short standing intervals     Vision Baseline Vision/History: 0 No visual deficits;1  Wears glasses              Pertinent Vitals/Pain Pain Assessment Pain Assessment: 0-10 Faces Pain Scale: Hurts even more Breathing: normal Pain Location: R ankle Pain Descriptors / Indicators: Throbbing, Discomfort, Dull Pain Intervention(s): Monitored during session, Premedicated before session, Repositioned     Extremity/Trunk Assessment Upper Extremity Assessment Upper Extremity Assessment: Right hand dominant;Overall [facility] for tasks assessed   Lower Extremity Assessment Lower Extremity Assessment: Defer to PT evaluation   Cervical / Trunk Assessment Cervical / Trunk Assessment: Normal Cervical / Trunk Exceptions: Body Habitus   Communication Communication Communication: No apparent difficulties   Cognition Arousal: Alert Behavior During Therapy: WFL for tasks assessed/performed Cognition: No apparent impairments                               Following commands: Intact       Cueing  General Comments   Cueing Techniques: Verbal cues  no SOB on RA, stood 2 min x 4 trials with seated rests sinkside to perorm grooming and hair washing with shampoo cap           Home Living Family/patient expects to be discharged to:: Private residence Living Arrangements: Parent Available Help at Discharge: Family;Available 24 hours/day Type of Home: House Home Access: Stairs to enter [employer] of Steps: 3 Entrance Stairs-Rails: Right Home Layout: Two level;Bed/bath upstairs;1/2 bath on main level;Other (Comment) (has been sleeping on the couch) Alternate Level Stairs-Number of Steps: Flight Alternate Level Stairs-Rails: Right Bathroom Shower/Tub: Producer, television/film/video: Standard Bathroom Accessibility: Yes   Home Equipment: Cane - single point;Tub bench;Rolling Walker (2 wheels);Wheelchair - manual   Additional Comments: ramp to be installed, dad lives with pt but dad is in hospital, so doesn't have help available, wants to rent  hospital bed to use on main level; pt declined trial of knee scooter      Prior Functioning/Environment Prior Level of Function : Independent/Modified Independent;Driving             Mobility Comments: Indep no AD. Denies falls in the past 6 mo. ADLs Comments: Indep with ADL/IADLs. Pt utilizes tub bench. Enjoys dancing. Not working since May, laid off. Drives    OT Problem List: Impaired balance (sitting and/or standing);Pain   OT Treatment/Interventions: Self-care/ADL training;Therapeutic exercise;Neuromuscular education;Energy conservation;DME and/or AE instruction;Therapeutic activities;Patient/family education;Balance training      OT Goals(Current goals can be found in the care plan section)   Acute Rehab OT Goals Patient Stated Goal: to get to rehab OT Goal Formulation: With patient Time For Goal Achievement: 04/07/24 Potential to Achieve Goals: Good ADL Goals Pt Will Perform Lower Body Bathing: with supervision;with adaptive equipment;sit to/from stand Pt Will Perform Lower Body Dressing: with supervision;with adaptive equipment;sit to/from stand Pt Will Transfer to Toilet: with supervision;ambulating;regular height toilet Pt Will Perform Toileting - Clothing Manipulation and hygiene: with set-up;sitting/lateral leans Additional ADL Goal #1: Patient will improve standing tolerance to 5 min for BADL's   OT Frequency:  Min 2X/week       AM-PAC OT 6 Clicks Daily Activity     Outcome Measure Help from another person eating meals?: None Help from another person taking care of personal grooming?: A Little Help from another person toileting, which includes using toliet, bedpan, or urinal?: A Little Help from another person bathing (including washing,  rinsing, drying)?: A Lot Help from another person to put on and taking off regular upper body clothing?: A Little Help from another person to put on and taking off regular lower body clothing?: A Lot 6 Click Score: 17    End of Session Equipment Utilized During Treatment: Gait belt;Rolling walker (2 wheels) Nurse Communication: Mobility status  Activity Tolerance: Patient tolerated treatment well Patient left: in bed;with call bell/phone within reach;with bed alarm set  OT Visit Diagnosis: Unsteadiness on feet (R26.81);Pain                Time: 1150-1235 OT Time Calculation (min): 45 min Charges:  OT General Charges $OT Visit: 1 Visit OT Evaluation $OT Eval Low Complexity: 1 Low OT Treatments $Self Care/Home Management : 8-22 mins  Sun Wilensky OT/L Acute Rehabilitation Department  [phone]   03/24/2024, 12:58 PM

## 2024-03-25 DIAGNOSIS — T847XXA Infection and inflammatory reaction due to other internal orthopedic prosthetic devices, implants and grafts, initial encounter: Secondary | ICD-10-CM | POA: Diagnosis not present

## 2024-03-25 LAB — CBC
HCT: 34.6 % — ABNORMAL LOW (ref 39.0–52.0)
Hemoglobin: 11 g/dL — ABNORMAL LOW (ref 13.0–17.0)
MCH: 32.2 pg (ref 26.0–34.0)
MCHC: 31.8 g/dL (ref 30.0–36.0)
MCV: 101.2 fL — ABNORMAL HIGH (ref 80.0–100.0)
Platelets: 109 K/uL — ABNORMAL LOW (ref 150–400)
RBC: 3.42 MIL/uL — ABNORMAL LOW (ref 4.22–5.81)
RDW: 16.8 % — ABNORMAL HIGH (ref 11.5–15.5)
WBC: 9 K/uL (ref 4.0–10.5)
nRBC: 0 % (ref 0.0–0.2)

## 2024-03-25 LAB — BASIC METABOLIC PANEL WITH GFR
Anion gap: 8 (ref 5–15)
BUN: 23 mg/dL — ABNORMAL HIGH (ref 6–20)
CO2: 27 mmol/L (ref 22–32)
Calcium: 8.4 mg/dL — ABNORMAL LOW (ref 8.9–10.3)
Chloride: 98 mmol/L (ref 98–111)
Creatinine, Ser: 1.44 mg/dL — ABNORMAL HIGH (ref 0.61–1.24)
GFR, Estimated: >60 mL/min (ref >60)
Glucose, Bld: 85 mg/dL (ref 70–99)
Potassium: 3.7 mmol/L (ref 3.5–5.1)
Sodium: 133 mmol/L — ABNORMAL LOW (ref 135–145)

## 2024-03-25 LAB — MAGNESIUM: Magnesium: 2.3 mg/dL (ref 1.7–2.4)

## 2024-03-25 NOTE — Progress Notes (Signed)
 PT Cancellation Note  Patient Details Name: Thomas [name] MRN: [id number] DOB: June 29, 1975   Cancelled Treatment:    Reason Eval/Treat Not Completed: Fatigue/lethargy limiting ability to participate (pt reports he's fatigued from getting up to use the bedside commode frequently after taking lactulose. Pt requested PT attempt another time. Will follow.)   Sylvan Delon Copp PT 03/25/2024  Acute Rehabilitation Services  Office [phone]

## 2024-03-25 NOTE — Progress Notes (Signed)
 PROGRESS NOTE  Thomas [name] FMW:[id number] DOB: 06/07/1976 DOA: 03/20/2024 PCP: Berneta Elsie Sayre, MD   LOS: 5 days   Brief narrative:  Thomas [name] is an 48 y.o. male past medical history significant for psoriatic arthritis obesity and alcoholic cirrhosis, history of right ankle fracture with hardware and surgical repair in July 2024, recently discharged from the hospital in September 2025 for hepatic hydrothorax and underwent thoracocentesis at that time who presented to the hospital with right ankle swelling and pain likely due to cellulitis in the setting of surgical hardware.    At this time, patient has undergone surgical intervention of the right ankle, plan is prolonged antibiotics through PICC line and is awaiting for CIR placement.  Assessment/Plan: Principal Problem:   Wound infection complicating hardware, initial encounter Active Problems:   Alcoholic cirrhosis of liver (HCC)   Class 3 severe obesity due to excess calories with body mass index (BMI) of 50.0 to 59.9 in adult (HCC)   Psoriatic arthritis (HCC)   Thrombocytopenia   Hydrothorax  Right ankle cellulitis with  wound infection complicating hardware, initial encounter Continue IV cefazolin , orthopedics was consulted and patient underwent washout and hardware removal.  Blood cultures negative in 4 days.  CRP of 4.6.  Blood cultures negative in 3 days.  Deep tissue culture with methicillin sensitive Staph aureus.  Infectious disease on board and l has undergone PICC line placement for prolonged IV antibiotics..  Continue pain management.  Orthopedic has seen the patient and recommend nonweightbearing in the right lower extremity in the splint.  Recommend aspirin 81 milligram twice daily for 4 weeks for DVT prophylaxis.  Plan is to keep the splint intact until follow-up with orthopedics in 2 weeks after surgery for wound check and suture removal.   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  Lactulose dose was decreased to once daily y and has had 3 bowel movements after that so we will keep the same dose if not we will titrate to keep the bowel movements 2-3 times a day.  Recurrent hepatic hydrothorax: Patient remains stable breathing wise..  IR was consulted for thoracocentesis and 2.2 L of fluid was removed.  Repeat chest x-ray with improved but persistent right effusion.  Saturating well on room air.  Not orthopneic.  Psoriatic arthritis: Steroids have been discontinued.   Class 3 severe obesity due to excess calories with body mass index (BMI) of 50.0 to 59.9 in adult  Would benefit from lifestyle modification as outpatient.   Thrombocytopenia Improved likely secondary to alcohol abuse.  Latest platelet count of 109  Debility deconditioning.  Physical therapy has seen the patient and recommended CIR     DVT prophylaxis: enoxaparin  (LOVENOX ) injection 40 mg Start: 03/22/24 0900 SCDs Start: 03/20/24 1148   Disposition: Inpatient rehab as per PT evaluation.  Medically stable for disposition.  Status is: Inpatient Remains inpatient appropriate because: IV antibiotics, need for CIR.     Code Status:     Code Status: Full Code  Family Communication: None at bedside  Consultants: Infectious disease Orthopedics  Procedures: Right right ankle washout and hardware removal Thoracocentesis  Anti-infectives:  Cefazolin  IV  Anti-infectives (From admission, onward)    Start     Dose/Rate Route Frequency Ordered Stop   03/24/24 1400  ceFAZolin  (ANCEF ) IVPB 2g/100 mL premix        2 g 200 mL/hr over 30 Minutes Intravenous Every 8  hours 03/24/24 0938     03/24/24 0000  ceFAZolin  (ANCEF ) IVPB        2 g Intravenous Every 8 hours 03/24/24 1123 05/02/24 2359   03/23/24 1200  cefTRIAXone  (ROCEPHIN ) 2 g in sodium chloride  0.9 % 100 mL IVPB  Status:  Discontinued        2 g 200 mL/hr over  30 Minutes Intravenous Every 24 hours 03/22/24 1448 03/24/24 0938   03/22/24 2100  vancomycin (VANCOCIN) IVPB 1000 mg/200 mL premix  Status:  Discontinued        1,000 mg 200 mL/hr over 60 Minutes Intravenous Every 12 hours 03/22/24 1006 03/24/24 0935   03/21/24 1408  vancomycin (VANCOCIN) powder  Status:  Discontinued          As needed 03/21/24 1408 03/21/24 1507   03/21/24 1200  cefTRIAXone  (ROCEPHIN ) 1 g in sodium chloride  0.9 % 100 mL IVPB  Status:  Discontinued        1 g 200 mL/hr over 30 Minutes Intravenous Every 24 hours 03/20/24 1818 03/22/24 1448   03/21/24 0900  vancomycin (VANCOREADY) IVPB 1250 mg/250 mL  Status:  Discontinued        1,250 mg 166.7 mL/hr over 90 Minutes Intravenous Every 12 hours 03/20/24 1832 03/22/24 1006   03/20/24 1930  vancomycin (VANCOREADY) IVPB 2000 mg/400 mL        2,000 mg 200 mL/hr over 120 Minutes Intravenous STAT 03/20/24 1832 03/20/24 2143   03/20/24 1115  cefTRIAXone  (ROCEPHIN ) 2 g in sodium chloride  0.9 % 100 mL IVPB        2 g 200 mL/hr over 30 Minutes Intravenous  Once 03/20/24 1105 03/20/24 1259        Subjective: Today, patient was seen and examined at bedside.  Patient states intermittent pain over the surgical site and leg otherwise has any shortness of breath dyspnea chest pain.  Able to sleep and rest okay. Objective: Vitals:   03/24/24 2034 03/25/24 0455  BP: 117/65 114/70  Pulse: 72 73  Resp: 15 15  Temp: 98.5 F (36.9 C) 98.4 F (36.9 C)  SpO2: 99% 97%    Intake/Output Summary (Last 24 hours) at 03/25/2024 1339 Last data filed at 03/25/2024 1055 Gross per 24 hour  Intake 1800.07 ml  Output 1550 ml  Net 250.07 ml   Filed Weights   03/21/24 1210  Weight: 119.7 kg   Body mass index is 43.93 kg/m.   Physical Exam: General: Obese built, not in obvious distress, on room air HENT:   No scleral pallor or icterus noted. Oral mucosa is moist.  Chest:    Diminished breath sounds bilaterally.  CVS: S1 &S2 heard. No  murmur.  Regular rate and rhythm. Abdomen: Soft, nontender, distended with ascites bowel sounds are heard.   Extremities: No cyanosis, clubbing with right lower extremity dressing.  Peripheral pulses are palpable. Psych: Alert, awake and oriented, normal mood CNS:  No cranial nerve deficits.  Power equal in all extremities.   Skin: Warm and dry.  No rashes noted.  Data Review: I have personally reviewed the following laboratory data and studies,  CBC: Recent Labs  Lab 03/20/24 0600 03/21/24 0427 03/24/24 0045 03/25/24 0311  WBC 13.3* 10.5 10.7* 9.0  NEUTROABS 9.9*  --   --   --   HGB 13.3 11.9* 11.7* 11.0*  HCT 41.4 36.4* 37.1* 34.6*  MCV 99.8 100.6* 101.6* 101.2*  PLT 137* 112* 161 109*   Basic Metabolic Panel: Recent Labs  Lab  03/21/24 0427 03/22/24 0826 03/23/24 0326 03/24/24 0045 03/25/24 0311  NA 134* 131* 133* 133* 133*  K 4.7 4.3 3.9 3.5 3.7  CL 97* 95* 98 98 98  CO2 32 29 27 26 27   GLUCOSE 87 127* 111* 133* 85  BUN 22* 28* 26* 25* 23*  CREATININE 0.90 0.98 0.90 1.00 1.44*  CALCIUM 8.6* 8.7* 8.6* 8.7* 8.4*  MG  --   --   --  2.1 2.3   Liver Function Tests: Recent Labs  Lab 03/20/24 0730 03/21/24 0427 03/22/24 0826 03/23/24 0326  AST 117* 124* 78* 116*  ALT 123* 124* 104* 114*  ALKPHOS 264* 250* 237* 255*  BILITOT 4.6* 4.8* 3.9* 3.0*  PROT 6.9 6.0* 6.6 6.0*  ALBUMIN  2.5* 2.3* 2.5* 2.4*   No results for input(s): LIPASE, AMYLASE in the last 168 hours. Recent Labs  Lab 03/20/24 1407 03/24/24 0045  AMMONIA 90* 47*   Cardiac Enzymes: No results for input(s): CKTOTAL, CKMB, CKMBINDEX, TROPONINI in the last 168 hours. BNP (last 3 results) Recent Labs    02/20/24 1723 03/09/24 1351  BNP 33.0 30.6    ProBNP (last 3 results) No results for input(s): PROBNP in the last 8760 hours.  CBG: No results for input(s): GLUCAP in the last 168 hours. Recent Results (from the past 240 hours)  Surgical pcr screen     Status: None    Collection Time: 03/21/24  4:42 AM   Specimen: Nasal Mucosa; Nasal Swab  Result Value Ref Range Status   MRSA, PCR NEGATIVE NEGATIVE Final   Staphylococcus aureus NEGATIVE NEGATIVE Final    Comment: (NOTE) The Xpert SA Assay (FDA approved for NASAL specimens in patients 17 years of age and older), is one component of a comprehensive surveillance program. It is not intended to diagnose infection nor to guide or monitor treatment. Performed at [facility], [address] [place], [place] [address]   Culture, blood (Routine X 2) w Reflex to ID Panel     Status: None (Preliminary result)   Collection Time: 03/21/24  7:18 AM   Specimen: BLOOD RIGHT ARM  Result Value Ref Range Status   Specimen Description   Final    BLOOD RIGHT ARM Performed at [facility], [address], [place], [place] [address]    Special Requests   Final    BOTTLES DRAWN AEROBIC AND ANAEROBIC Blood Culture adequate volume Performed at [facility], [address] [place], [place] [address]    Culture   Final    NO GROWTH 4 DAYS Performed at [facility], [address], [place], [place] [address]    Report Status PENDING  Incomplete  Culture, blood (Routine X 2) w Reflex to ID Panel     Status: None (Preliminary result)   Collection Time: 03/21/24  7:18 AM   Specimen: BLOOD RIGHT HAND  Result Value Ref Range Status   Specimen Description   Final    BLOOD RIGHT HAND Performed at [facility], [address], [place], [place] [address]    Special Requests   Final    BOTTLES DRAWN AEROBIC AND ANAEROBIC Blood Culture adequate volume Performed at [facility], [address], [place], [place] [address]    Culture   Final    NO GROWTH 4 DAYS Performed at [facility], [address] [place], [place] [address]    Report Status PENDING  Incomplete  Aerobic/Anaerobic Culture w  Gram Stain (surgical/deep wound)     Status: None  (Preliminary result)   Collection Time: 03/21/24  2:11 PM   Specimen: Bone; Tissue  Result Value Ref Range Status   Specimen Description   Final    BONE Performed at [facility], [address] [place], [place] [address]    Special Requests   Final    NONE Performed at [facility], [address], [place], [place] [address]    Gram Stain   Final    NO WBC SEEN NO ORGANISMS SEEN Performed at [facility], [address] [place], [place] [address]    Culture   Final    FEW STAPHYLOCOCCUS AUREUS NO ANAEROBES ISOLATED; CULTURE IN PROGRESS FOR 5 DAYS    Report Status PENDING  Incomplete   Organism ID, Bacteria STAPHYLOCOCCUS AUREUS  Final      Susceptibility   Staphylococcus aureus - MIC*    CIPROFLOXACIN <=0.5 SENSITIVE Sensitive     ERYTHROMYCIN  >=8 RESISTANT Resistant     GENTAMICIN <=0.5 SENSITIVE Sensitive     OXACILLIN 0.5 SENSITIVE Sensitive     TETRACYCLINE <=1 SENSITIVE Sensitive     VANCOMYCIN 1 SENSITIVE Sensitive     TRIMETH/SULFA <=10 SENSITIVE Sensitive     CLINDAMYCIN RESISTANT Resistant     RIFAMPIN <=0.5 SENSITIVE Sensitive     Inducible Clindamycin POSITIVE Resistant     LINEZOLID 2 SENSITIVE Sensitive     * FEW STAPHYLOCOCCUS AUREUS     Studies: No results found.     Vernal Alstrom, MD  [facility] 03/25/2024  If 7PM-7AM, please contact night-coverage

## 2024-03-25 NOTE — TOC Progression Note (Signed)
 Transition of Care [facility]) - Progression Note    Patient Details  Name: Thomas [name] MRN: [id number] Date of Birth: 1976-01-31  Transition [facility]) CM/SW Contact  Sonda Manuella Quill, RN Phone Number: 03/25/2024, 2:11 PM  Clinical Narrative:    IP CM acknowledges consult for medication assistance; pt has insurance and does not qualify for medication assistance. IP CM is following.   Expected Discharge Plan: Skilled Nursing Facility (vs. CIR?) Barriers to Discharge: Continued Medical Work up, English as a second language teacher               Expected Discharge Plan and Services In-house Referral: Clinical Social Work     Living arrangements for the past 2 months: Single Family Home                                       Social Drivers of Health (SDOH) Interventions SDOH Screenings   Food Insecurity: No Food Insecurity (03/20/2024)  Housing: Unknown (03/20/2024)  Transportation Needs: No Transportation Needs (03/20/2024)  Utilities: Not At Risk (03/20/2024)  Depression (PHQ2-9): Low Risk  (07/28/2022)  Financial Resource Strain: Low Risk  (11/26/2023)  Physical Activity: Insufficiently Active (11/26/2023)  Social Connections: Socially Isolated (11/26/2023)  Stress: No Stress Concern Present (11/26/2023)  Tobacco Use: High Risk (03/21/2024)    Readmission Risk Interventions    03/23/2024    3:12 PM  Readmission Risk Prevention Plan  Transportation Screening Complete  PCP or Specialist Appt within 5-7 Days Complete  Home Care Screening Complete  Medication Review (RN CM) Complete

## 2024-03-25 NOTE — Plan of Care (Signed)
  Problem: Coping: Goal: Level of anxiety will decrease Outcome: Progressing   Problem: Activity: Goal: Risk for activity intolerance will decrease Outcome: Progressing   Problem: Clinical Measurements: Goal: Respiratory complications will improve Outcome: Progressing   Problem: Pain Managment: Goal: General experience of comfort will improve and/or be controlled Outcome: Progressing

## 2024-03-25 NOTE — Plan of Care (Signed)
   Problem: Education: Goal: Knowledge of General Education information will improve Description: Including pain rating scale, medication(s)/side effects and non-pharmacologic comfort measures Outcome: Progressing   Problem: Activity: Goal: Risk for activity intolerance will decrease Outcome: Progressing

## 2024-03-25 NOTE — Progress Notes (Addendum)
 Inpatient Rehab Admissions Coordinator:  Insurance authorization received. Will have a bed available in CIR to admit pt tomorrow, October 18th. Dr. Sonjia aware and in agreement. Will continue to follow.  Notified of lack of bed availability for Saturday, October 18th. Plan to admit pt to CIR on Sunday, October 19th.  Tinnie Yvone Cohens, MS, CCC-SLP Admissions Coordinator [phone]

## 2024-03-25 NOTE — TOC Progression Note (Signed)
 Transition of Care [facility]) - Progression Note    Patient Details  Name: Thomas [name] MRN: [id number] Date of Birth: Dec 25, 1975  Transition of [facility]) CM/SW Contact  Sonda Manuella Quill, RN Phone Number: 03/25/2024, 4:54 PM  Clinical Narrative:    Notified by Tinnie Cohens, CIR that pt may admit pt to CIR tomorrow okay w/ Dr. Sonjia; Dr Sonjia notified via secure chat; IP CM is following.  Expected Discharge Plan: Skilled Nursing Facility (vs. CIR?) Barriers to Discharge: Continued Medical Work up, English as a second language teacher               Expected Discharge Plan and Services In-house Referral: Clinical Social Work     Living arrangements for the past 2 months: Single Family Home                                       Social Drivers of Health (SDOH) Interventions SDOH Screenings   Food Insecurity: No Food Insecurity (03/20/2024)  Housing: Unknown (03/20/2024)  Transportation Needs: No Transportation Needs (03/20/2024)  Utilities: Not At Risk (03/20/2024)  Depression (PHQ2-9): Low Risk  (07/28/2022)  Financial Resource Strain: Low Risk  (11/26/2023)  Physical Activity: Insufficiently Active (11/26/2023)  Social Connections: Socially Isolated (11/26/2023)  Stress: No Stress Concern Present (11/26/2023)  Tobacco Use: High Risk (03/21/2024)    Readmission Risk Interventions    03/23/2024    3:12 PM  Readmission Risk Prevention Plan  Transportation Screening Complete  PCP or Specialist Appt within 5-7 Days Complete  Home Care Screening Complete  Medication Review (RN CM) Complete

## 2024-03-25 NOTE — H&P (Incomplete)
 Physical Medicine and Rehabilitation Admission H&P    Chief Complaint  Patient presents with   Functional deficits due to hardware infection/NWB status    HPI:  Thomas [name] is a 48 year old male with history of ETOH abuse with cirrhosis of liver, pleural effusion s/p thoracocentesis 02/2024, OSA, psoriasis, GAD,  ORIF right ankle 12/2022 who was admitted to [facility]  on 03/20/24 with reports of ankle pain with swelling and after hearing a pop with opening of wound and onset of drainage. CXR showed right pleural effusion which was tapped for 2.2 L yellow hazy fluid.  He was found to have leukocytosis with elevated LFTs, elevated INR at 1.6 and elevated ammonia level at 90. BLE Dopplers were negative for edema.  Blood cultures draw, he was started on IV antibiotics and 3 times daily lactulose added.  He underwent I&D of right ankle with hardware removal and wound revision on 10/13 by Dr. Edna.  Postop to be NWB RLE.  Dr. Juli consulted for input and as wound cultures positive for MSSA, antibiotics narrowed to cefazolin   IV every 8 hours with recommendations for 6 weeks therapy with EOT 05/02/24.  Recently added steroids were discontinued and lactulose decreased to once a day as ammonia levels trending down.  Hyponatremia has been monitored.  PT/OT were consulted and patient was noted to be limited by weakness, fatigue and pain.  He requires contact-guard assist with mobility.  He was independent working prior to admission.  CIR recommended due to functional decline.   ROS   Past Medical History:  Diagnosis Date   Acute conjunctivitis of both eyes 10/22/2021   Acute sinusitis 06/16/2013   Alcoholic cirrhosis (HCC)    Alcoholic fatty liver 01/16/2010   Needs final HBV and HAV vaccines on or after 10/25/2012    Alcoholism (HCC) 12/25/2011   Allergic rhinitis    Childhood asthma    Elevated transaminase level 06/10/2007   AST: 80 ALT: 136 in 8/11: Hepatitis A., B and C negative.     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    Sleep apnea     Past Surgical History:  Procedure Laterality Date   ESOPHAGEAL BANDING  12/26/2022   Procedure: ESOPHAGEAL BANDING;  Surgeon: Shila Gustav GAILS, MD;  Location: [facility] ENDOSCOPY;  Service: Gastroenterology;;   ESOPHAGOGASTRODUODENOSCOPY (EGD) WITH PROPOFOL  N/A 07/05/2022   Procedure: ESOPHAGOGASTRODUODENOSCOPY (EGD) WITH PROPOFOL ;  Surgeon: Shila Gustav GAILS, MD;  Location: [facility] ENDOSCOPY;  Service: Gastroenterology;  Laterality: N/A;   ESOPHAGOGASTRODUODENOSCOPY (EGD) WITH PROPOFOL  N/A 12/26/2022   Procedure: ESOPHAGOGASTRODUODENOSCOPY (EGD) WITH PROPOFOL ;  Surgeon: Shila Gustav GAILS, MD;  Location: [facility] ENDOSCOPY;  Service: Gastroenterology;  Laterality: N/A;   ESOPHAGOGASTRODUODENOSCOPY (EGD) WITH PROPOFOL  N/A 02/12/2023   Procedure: ESOPHAGOGASTRODUODENOSCOPY (EGD) WITH PROPOFOL ;  Surgeon: Shila Gustav GAILS, MD;  Location: [facility] ENDOSCOPY;  Service: Gastroenterology;  Laterality: N/A;   INCISION AND DRAINAGE OF DEEP ABSCESS, ANKLE Right 03/21/2024   Procedure: INCISION AND DRAINAGE OF DEEP ABSCESS, ANKLE;  Surgeon: Edna Toribio LABOR, MD;  Location: [facility] ORS;  Service: Orthopedics;  Laterality: Right;   ORIF ANKLE FRACTURE Right 12/28/2022   Procedure: OPEN REDUCTION INTERNAL FIXATION (ORIF) ANKLE FRACTURE;  Surgeon: Josefina Chew, MD;  Location: [facility] OR;  Service: Orthopedics;  Laterality: Right;    Family History  Problem  Relation Age of Onset   Liver disease Mother    Depression Mother    Liver disease Father    Diabetes Father    Hypertension Father    Liver disease Sister    Diabetes Other    Colon cancer Neg Hx    Esophageal cancer Neg Hx    Stomach cancer Neg Hx    Colon polyps Neg Hx    Social History: Lives with father and was  working prior to admission.   Reports that he has been smoking cigars. He has been exposed to tobacco smoke. He has never used smokeless tobacco. He reports that he does not currently use alcohol. He reports that he does not use drugs.   Allergies  Allergen Reactions   Codeine Other (See Comments)    Jittery     Medications Prior to Admission  Medication Sig Dispense Refill   albuterol  (VENTOLIN  HFA) 108 (90 Base) MCG/ACT inhaler Inhale 1-2 puffs into the lungs every 6 (six) hours as needed for wheezing or shortness of breath. 8 g 1   b complex vitamins capsule Take 1 capsule by mouth daily.     clobetasol  cream (TEMOVATE ) 0.05 % Apply topically 2 (two) times daily. Apply to Psoriatic plaques on the extremities, trunk or scalp. DO not apply to face, intertriginous areas (armpits, groin, under pannus, gluteal cleft), or genitals. 60 g 0   Cyanocobalamin (VITAMIN B12 PO) Take 1 tablet by mouth daily.     FOLIC ACID  PO Take 1 tablet by mouth daily.     furosemide  (LASIX ) 40 MG tablet Take 1 tablet (40 mg total) by mouth daily. 30 tablet 5   lactulose (CHRONULAC) 10 GM/15ML solution Take 45 mLs (30 g total) by mouth 3 (three) times daily. 236 mL 0   loteprednol (LOTEMAX) 0.5 % ophthalmic suspension Place 1 drop into both eyes 4 (four) times daily.     methylPREDNISolone  (MEDROL ) 8 MG tablet Take 3 tablets (24 mg total) by mouth daily for 7 days, THEN 2 tablets (16 mg total) daily for 7 days, THEN 1 tablet (8 mg total) daily for 14 days. 49 tablet 0   Multiple Vitamins-Minerals (ZINC PO) Take 1 tablet by mouth daily.     nadolol  (CORGARD ) 20 MG tablet Take 1 tablet (20 mg total) by mouth daily. 30 tablet 11   spironolactone  (ALDACTONE ) 25 MG tablet Take 1 tablet (25 mg total) by mouth daily. 30 tablet 0   VITAMIN D  PO Take 1 tablet by mouth daily.     oxyCODONE  (OXY IR/ROXICODONE ) 5 MG immediate release tablet Take 1 tablet (5 mg total) by mouth every 8 (eight) hours as needed for severe pain  (pain score 7-10). (Patient not taking: Reported on 03/20/2024) 15 tablet 0   pantoprazole  (PROTONIX ) 40 MG tablet TAKE 1 TABLET BY MOUTH TWICE A DAY 180 tablet 0   Secukinumab (COSENTYX UNOREADY) 300 MG/2ML SOAJ Inject 300mg  into the skin at Weeks 0, 1, 2, 3 8 mL 0    Home: Home Living Family/patient expects to be discharged to:: Private residence Living Arrangements: Parent Available Help at Discharge: Family, Available PRN/intermittently Type of Home: House Home Access: Stairs to enter Secretary/administrator of Steps: 3 Entrance Stairs-Rails: None (pt reports having ramp built) Home Layout: Two level, Bed/bath upstairs, 1/2 bath on main level, Other (Comment) (has been sleeping on the couch) Alternate Level Stairs-Number of Steps: Flight Alternate Level Stairs-Rails: Right Bathroom Shower/Tub: Health visitor: Standard Bathroom Accessibility: Yes Home Equipment:  Cane - single point, Tub bench, Agricultural consultant (2 wheels), Wheelchair - manual Additional Comments: ramp to be installed, dad lives with pt but dad is in hospital, so doesn't have help available, wants to rent hospital bed to use on main level; pt declined trial of knee scooter   Functional History: Prior Function Prior Level of Function : Independent/Modified Independent, Driving Mobility Comments: Indep no AD. Denies falls in the past 6 mo. ADLs Comments: Indep with ADL/IADLs. Pt utilizes tub bench. Enjoys dancing. Not working since May, laid off. Drives  Functional Status:  Mobility: Bed Mobility Overal bed mobility: Modified Independent General bed mobility comments: self able and knowledable to elevate R LE Transfers Overall transfer level: Needs assistance Equipment used: Rolling walker (2 wheels), None Transfers: Sit to/from Stand Sit to Stand: Contact guard assist Stand pivot transfers: Contact guard assist, From elevated surface General transfer comment: min cues for hand  placement Ambulation/Gait Ambulation/Gait assistance: Contact guard assist Gait Distance (Feet): 24 Feet Assistive device: Rolling walker (2 wheels) Gait Pattern/deviations: Step-to pattern General Gait Details: decreased amb distance this session due to increased pain.  I think I did too much yesterday.  Amb around room with walker NWBing R LE.  Static standing at sink to brush his teeth.  BSC placed behind Pt as a precaution to sit when fatigued. Gait velocity: decreased General stair comments: unable to attempt any stair training this session due to increased c/o pain.    ADL: ADL Overall ADL's : Needs assistance/impaired Eating/Feeding: Set up, Sitting Grooming: Wash/dry hands, Wash/dry face, Oral care, Minimal assistance, Standing Upper Body Bathing: Contact guard assist, Sitting Lower Body Bathing: Moderate assistance, Sit to/from stand Upper Body Dressing : Sitting, Contact guard assist Lower Body Dressing: Moderate assistance, Sit to/from stand Toilet Transfer: Contact guard assist, Minimal assistance, BSC/3in1, Rolling walker (2 wheels) Toileting- Clothing Manipulation and Hygiene: Contact guard assist, Sitting/lateral lean Functional mobility during ADLs: Contact guard assist, Rolling walker (2 wheels), Minimal assistance General ADL Comments: min cues to plan set up due to NWB LE for placement of items for reach, seated rests needed during short standing intervals  Cognition: Cognition Orientation Level: Oriented X4 Cognition Arousal: Alert Behavior During Therapy: WFL for tasks assessed/performed   Blood pressure (!) 101/52, pulse 77, temperature 98.1 F (36.7 C), temperature source Oral, resp. rate 18, height 5' 5 (1.651 m), weight 119.7 kg, SpO2 100%. Physical Exam  Results for orders placed or performed during the hospital encounter of 03/20/24 (from the past 48 hours)  CBC     Status: Abnormal   Collection Time: 03/24/24 12:45 AM  Result Value Ref Range    WBC 10.7 (H) 4.0 - 10.5 K/uL   RBC 3.65 (L) 4.22 - 5.81 MIL/uL   Hemoglobin 11.7 (L) 13.0 - 17.0 g/dL   HCT 62.8 (L) 60.9 - 47.9 %   MCV 101.6 (H) 80.0 - 100.0 fL   MCH 32.1 26.0 - 34.0 pg   MCHC 31.5 30.0 - 36.0 g/dL   RDW 83.1 (H) 88.4 - 84.4 %   Platelets 161 150 - 400 K/uL   nRBC 0.0 0.0 - 0.2 %    Comment: Performed at [facility], [address] [place], [place] [address]  Basic metabolic panel with GFR     Status: Abnormal   Collection Time: 03/24/24 12:45 AM  Result Value Ref Range   Sodium 133 (L) 135 - 145 mmol/L   Potassium 3.5 3.5 - 5.1 mmol/L   Chloride 98 98 -  111 mmol/L   CO2 26 22 - 32 mmol/L   Glucose, Bld 133 (H) 70 - 99 mg/dL    Comment: Glucose reference range applies only to samples taken after fasting for at least 8 hours.   BUN 25 (H) 6 - 20 mg/dL   Creatinine, Ser 8.99 0.61 - 1.24 mg/dL   Calcium 8.7 (L) 8.9 - 10.3 mg/dL   GFR, Estimated >39 >39 mL/min    Comment: (NOTE) Calculated using the CKD-EPI Creatinine Equation (2021)    Anion gap 8 5 - 15    Comment: Performed at [facility], [address], [place], [place] [address]  Magnesium     Status: None   Collection Time: 03/24/24 12:45 AM  Result Value Ref Range   Magnesium 2.1 1.7 - 2.4 mg/dL    Comment: Performed at [facility], [address], [place], [place] [address]  Ammonia     Status: Abnormal   Collection Time: 03/24/24 12:45 AM  Result Value Ref Range   Ammonia 47 (H) 9 - 35 umol/L    Comment: Performed at [facility], [address] [place], [place] [address]  CBC     Status: Abnormal   Collection Time: 03/25/24  3:11 AM  Result Value Ref Range   WBC 9.0 4.0 - 10.5 K/uL   RBC 3.42 (L) 4.22 - 5.81 MIL/uL   Hemoglobin 11.0 (L) 13.0 - 17.0 g/dL   HCT 65.3 (L) 60.9 - 47.9 %   MCV 101.2 (H) 80.0 - 100.0 fL   MCH 32.2 26.0 - 34.0 pg   MCHC 31.8 30.0 - 36.0 g/dL   RDW 83.1 (H) 88.4 - 84.4 %   Platelets 109  (L) 150 - 400 K/uL   nRBC 0.0 0.0 - 0.2 %    Comment: Performed at [facility], [address], [place], [place] [address]  Magnesium     Status: None   Collection Time: 03/25/24  3:11 AM  Result Value Ref Range   Magnesium 2.3 1.7 - 2.4 mg/dL    Comment: Performed at [facility], [address] [place], [place] [address]  Basic metabolic panel with GFR     Status: Abnormal   Collection Time: 03/25/24  3:11 AM  Result Value Ref Range   Sodium 133 (L) 135 - 145 mmol/L   Potassium 3.7 3.5 - 5.1 mmol/L   Chloride 98 98 - 111 mmol/L   CO2 27 22 - 32 mmol/L   Glucose, Bld 85 70 - 99 mg/dL    Comment: Glucose reference range applies only to samples taken after fasting for at least 8 hours.   BUN 23 (H) 6 - 20 mg/dL   Creatinine, Ser 8.55 (H) 0.61 - 1.24 mg/dL   Calcium 8.4 (L) 8.9 - 10.3 mg/dL   GFR, Estimated >39 >39 mL/min    Comment: (NOTE) Calculated using the CKD-EPI Creatinine Equation (2021)    Anion gap 8 5 - 15    Comment: Performed at [facility], [address], [place], [place] [address]   No results found.    Blood pressure (!) 101/52, pulse 77, temperature 98.1 F (36.7 C), temperature source Oral, resp. rate 18, height 5' 5 (1.651 m), weight 119.7 kg, SpO2 100%.  Medical Problem List and Plan: 1. Functional deficits secondary to ***  -patient may *** shower  -ELOS/Goals: *** 2.  Antithrombotics: -DVT/anticoagulation:  Pharmaceutical: Lovenox   -antiplatelet therapy: N/A 3. Pain  Management:  Completed Toradol  X 5 days on 10/18. --Robaxin  and/or Oxycodone  prn.  4. Mood/Behavior/Sleep: LCSW to follow for evaluation and support.   -antipsychotic agents: N/A 5. Neuropsych/cognition: This patient *** capable of making decisions on *** own behalf. 6. Skin/Wound Care: Routine pressure relief measures.  7. Fluids/Electrolytes/Nutrition: Monitor I/O. Check CMET in am.  8. Infected hardware right ankle:  Osteomyelitis. MSSA + cultures s/p removal --Ancef  /x 6 weeks w/ EOT 05/02/24 9.  Cirrhosis of liver: Recurrent hepatic hydrothorax s/p thoracocentesis 2.2 Liters on 10/12  --on Lasix  40 mg, Aldactone  25 mg and nadolol  daily.  10  Hepatic encephalopathy: ----Elevated ammonia @90  (10/12)-->47.  --On lactulose 30 g/day. Recheck level in am.   11. Hyponatremia: Likely due to ETOH potomania.  --Has been as low as 129 on 10/01. Continue to monitor 12. Anemia: Monitor for signs of bleeding--hx of esophageal varices/portal HTN.   13. Thrombocytopenia: Platelets trending down again --now 109.  --Monitor for signs of bleeding.  14. H/o Psoriatic arthritis: On Cosentyx. Recently added steroids for ankle pain? were discontinued.   --Follow-up with rheumatology after.    15. Class 3 obesity: BMI 43.9.  Educated on appropriate diet and weight loss to help promote overall health and mobility.     ***  Sharlet GORMAN Schmitz, PA-C 03/25/2024

## 2024-03-26 DIAGNOSIS — T847XXA Infection and inflammatory reaction due to other internal orthopedic prosthetic devices, implants and grafts, initial encounter: Secondary | ICD-10-CM | POA: Diagnosis not present

## 2024-03-26 LAB — CULTURE, BLOOD (ROUTINE X 2)
Culture: NO GROWTH
Culture: NO GROWTH
Special Requests: ADEQUATE
Special Requests: ADEQUATE

## 2024-03-26 LAB — COMPREHENSIVE METABOLIC PANEL WITH GFR
ALT: 100 U/L — ABNORMAL HIGH (ref 0–44)
AST: 150 U/L — ABNORMAL HIGH (ref 15–41)
Albumin: 2.3 g/dL — ABNORMAL LOW (ref 3.5–5.0)
Alkaline Phosphatase: 327 U/L — ABNORMAL HIGH (ref 38–126)
Anion gap: 7 (ref 5–15)
BUN: 36 mg/dL — ABNORMAL HIGH (ref 6–20)
CO2: 27 mmol/L (ref 22–32)
Calcium: 8.5 mg/dL — ABNORMAL LOW (ref 8.9–10.3)
Chloride: 98 mmol/L (ref 98–111)
Creatinine, Ser: 1.01 mg/dL (ref 0.61–1.24)
GFR, Estimated: >60 mL/min (ref >60)
Glucose, Bld: 110 mg/dL — ABNORMAL HIGH (ref 70–99)
Potassium: 3.8 mmol/L (ref 3.5–5.1)
Sodium: 132 mmol/L — ABNORMAL LOW (ref 135–145)
Total Bilirubin: 3.2 mg/dL — ABNORMAL HIGH (ref 0.0–1.2)
Total Protein: 5.9 g/dL — ABNORMAL LOW (ref 6.5–8.1)

## 2024-03-26 LAB — AEROBIC/ANAEROBIC CULTURE W GRAM STAIN (SURGICAL/DEEP WOUND): Gram Stain: NONE SEEN

## 2024-03-26 LAB — CBC WITH DIFFERENTIAL/PLATELET
Abs Immature Granulocytes: 0.08 K/uL — ABNORMAL HIGH (ref 0.00–0.07)
Basophils Absolute: 0 K/uL (ref 0.0–0.1)
Basophils Relative: 1 %
Eosinophils Absolute: 0.7 K/uL — ABNORMAL HIGH (ref 0.0–0.5)
Eosinophils Relative: 8 %
HCT: 34.6 % — ABNORMAL LOW (ref 39.0–52.0)
Hemoglobin: 11.1 g/dL — ABNORMAL LOW (ref 13.0–17.0)
Immature Granulocytes: 1 %
Lymphocytes Relative: 12 %
Lymphs Abs: 1.1 K/uL (ref 0.7–4.0)
MCH: 32.2 pg (ref 26.0–34.0)
MCHC: 32.1 g/dL (ref 30.0–36.0)
MCV: 100.3 fL — ABNORMAL HIGH (ref 80.0–100.0)
Monocytes Absolute: 0.7 K/uL (ref 0.1–1.0)
Monocytes Relative: 8 %
Neutro Abs: 6.2 K/uL (ref 1.7–7.7)
Neutrophils Relative %: 70 %
Platelets: 118 K/uL — ABNORMAL LOW (ref 150–400)
RBC: 3.45 MIL/uL — ABNORMAL LOW (ref 4.22–5.81)
RDW: 16.4 % — ABNORMAL HIGH (ref 11.5–15.5)
WBC: 8.8 K/uL (ref 4.0–10.5)
nRBC: 0 % (ref 0.0–0.2)

## 2024-03-26 NOTE — Plan of Care (Signed)

## 2024-03-26 NOTE — Progress Notes (Addendum)
 Spoke with Dr. Sigdel via phone and made aware that pt has being feeling bad this afternoon. Feels weak and like he wanted to pass out. Recent vital signs taken WNL. Pt states he feels like this after he takes Lactulose. He reported 3 BM's since he took lactulose at 1130. Treated for pain in ankle. Refused Maalox for mild GI upset. No nausea. Took short nap. See labs ordered by MD in EPIC.

## 2024-03-26 NOTE — Progress Notes (Signed)
 Reported feeling much better, I don't know what that was. Reassured pt. Awaiting IV team nurse to obtain CBC.

## 2024-03-26 NOTE — Progress Notes (Signed)
 NP Andrez was notified that patient states that he feels like the splint is squeezing/compressing against his leg. Patient toes feel  a little cool, he is able to feel me touching them and wiggle his toes. I removed ace wrap and rewrapped a little looser. Edema +1 above splint near knee. He was given IV robaxin  but on reassessment patient states his leg feels the same.

## 2024-03-26 NOTE — Progress Notes (Signed)
 The IV Nurse received a consult to draw a CBC;  pt not in the room at 1810; will have next shift return for lab draw

## 2024-03-26 NOTE — Progress Notes (Signed)
 PROGRESS NOTE  Thomas [name] FMW:[id number] DOB: Apr 22, 1976 DOA: 03/20/2024 PCP: Berneta Elsie Sayre, MD   LOS: 6 days   Brief narrative:  Thomas [name] is an 48 y.o. male past medical history significant for psoriatic arthritis obesity and alcoholic cirrhosis, history of right ankle fracture with hardware and surgical repair in July 2024, recently discharged from the hospital in September 2025 for hepatic hydrothorax and underwent thoracocentesis at that time who presented to the hospital with right ankle swelling and pain likely due to cellulitis in the setting of surgical hardware.    At this time, patient has undergone surgical intervention of the right ankle, plan is prolonged antibiotics through PICC line and is awaiting for CIR placement.  Assessment/Plan: Principal Problem:   Wound infection complicating hardware, initial encounter Active Problems:   Alcoholic cirrhosis of liver (HCC)   Class 3 severe obesity due to excess calories with body mass index (BMI) of 50.0 to 59.9 in adult (HCC)   Psoriatic arthritis (HCC)   Thrombocytopenia   Hydrothorax  Right ankle cellulitis with  wound infection complicating hardware, initial encounter Continue IV cefazolin , orthopedics was consulted and patient underwent washout and hardware removal.  Blood cultures negative in 4 days.  CRP of 4.6.  Blood cultures negative in 3 days.  Deep tissue culture with methicillin sensitive Staph aureus.  Infectious disease on board and l has undergone PICC line placement for prolonged IV antibiotics..  Continue pain management.  Orthopedic has seen the patient and recommend nonweightbearing in the right lower extremity in the splint.  Recommend aspirin 81 milligram twice daily for 4 weeks for DVT prophylaxis.  Plan is to keep the splint intact until follow-up with orthopedics in 2 weeks after surgery for wound check and suture removal.   Alcoholic cirrhosis of liver with esophageal varices and portal  hypertension: Continue nadolol , Protonix  and lactulose.  Currently receiving Aldactone  and Lasix  as well, creatinine bumped to 1.44 yesterday from 1 the day before.  Will repeat CMP today, hold off on diuretics until CMP checked.  Continue lactulose.  Titrate to bowel movements 2-3 a day.  Recurrent hepatic hydrothorax: Patient remains stable breathing wise..  IR was consulted for thoracocentesis and 2.2 L of fluid was removed.  Repeat chest x-ray with improved but persistent right effusion.  Saturating well on room air.  Not orthopneic.  Psoriatic arthritis: Steroids have been discontinued.   Class 3 severe obesity due to excess calories with body mass index (BMI) of 50.0 to 59.9 in adult  Would benefit from lifestyle modification as outpatient.   Thrombocytopenia Improved likely secondary to alcohol abuse.  Latest platelet count of 109  Debility deconditioning.  Physical therapy has seen the patient and recommended CIR     DVT prophylaxis: enoxaparin  (LOVENOX ) injection 40 mg Start: 03/22/24 0900 SCDs Start: 03/20/24 1148   Disposition: Inpatient rehab as per PT evaluation.  Medically stable for disposition.  Status is: Inpatient Remains inpatient appropriate because: IV antibiotics, need for CIR.     Code Status:     Code Status: Full Code  Family Communication: None at bedside  Consultants: Infectious disease Orthopedics  Procedures: Right right ankle washout and hardware removal Thoracocentesis  Anti-infectives:  Cefazolin  IV  Anti-infectives (From admission, onward)    Start     Dose/Rate Route Frequency Ordered Stop   03/24/24 1400  ceFAZolin  (ANCEF ) IVPB 2g/100 mL premix        2 g 200 mL/hr over 30 Minutes Intravenous Every 8 hours 03/24/24  9061     03/24/24 0000  ceFAZolin  (ANCEF ) IVPB        2 g Intravenous Every 8 hours 03/24/24 1123 05/02/24 2359   03/23/24 1200  cefTRIAXone  (ROCEPHIN ) 2 g in sodium chloride  0.9 % 100 mL IVPB  Status:  Discontinued         2 g 200 mL/hr over 30 Minutes Intravenous Every 24 hours 03/22/24 1448 03/24/24 0938   03/22/24 2100  vancomycin (VANCOCIN) IVPB 1000 mg/200 mL premix  Status:  Discontinued        1,000 mg 200 mL/hr over 60 Minutes Intravenous Every 12 hours 03/22/24 1006 03/24/24 0935   03/21/24 1408  vancomycin (VANCOCIN) powder  Status:  Discontinued          As needed 03/21/24 1408 03/21/24 1507   03/21/24 1200  cefTRIAXone  (ROCEPHIN ) 1 g in sodium chloride  0.9 % 100 mL IVPB  Status:  Discontinued        1 g 200 mL/hr over 30 Minutes Intravenous Every 24 hours 03/20/24 1818 03/22/24 1448   03/21/24 0900  vancomycin (VANCOREADY) IVPB 1250 mg/250 mL  Status:  Discontinued        1,250 mg 166.7 mL/hr over 90 Minutes Intravenous Every 12 hours 03/20/24 1832 03/22/24 1006   03/20/24 1930  vancomycin (VANCOREADY) IVPB 2000 mg/400 mL        2,000 mg 200 mL/hr over 120 Minutes Intravenous STAT 03/20/24 1832 03/20/24 2143   03/20/24 1115  cefTRIAXone  (ROCEPHIN ) 2 g in sodium chloride  0.9 % 100 mL IVPB        2 g 200 mL/hr over 30 Minutes Intravenous  Once 03/20/24 1105 03/20/24 1259        Subjective: Patient seen and examined at the bedside.  He reports some anxiety and feeling overall weak today.  Reports couple of bowel movements overnight.  Denies any abdominal pain.  His vital signs have been stable.  He is afebrile.    Objective: Vitals:   03/25/24 2049 03/26/24 0525  BP: 113/65 (!) 104/58  Pulse: 81 76  Resp: 15 15  Temp: 99.1 F (37.3 C) 98.2 F (36.8 C)  SpO2: 96% 96%    Intake/Output Summary (Last 24 hours) at 03/26/2024 0743 Last data filed at 03/26/2024 0610 Gross per 24 hour  Intake 1504.99 ml  Output 800 ml  Net 704.99 ml   Filed Weights   03/21/24 1210  Weight: 119.7 kg   Body mass index is 43.93 kg/m.   Physical Exam: General: Obese built, not in obvious distress, on room air HENT:   No scleral pallor or icterus noted. Oral mucosa is moist.  Chest:    Diminished  breath sounds bilaterally.  CVS: S1 &S2 heard. No murmur.  Regular rate and rhythm. Abdomen: Soft, nontender, distended with ascites bowel sounds are heard.   Extremities: No cyanosis, clubbing with right lower extremity dressing.  Peripheral pulses are palpable. Psych: Alert, awake and oriented, normal mood CNS:  No cranial nerve deficits.  Power equal in all extremities.   Skin: Warm and dry.  No rashes noted.  Data Review: I have personally reviewed the following laboratory data and studies,  CBC: Recent Labs  Lab 03/20/24 0600 03/21/24 0427 03/24/24 0045 03/25/24 0311  WBC 13.3* 10.5 10.7* 9.0  NEUTROABS 9.9*  --   --   --   HGB 13.3 11.9* 11.7* 11.0*  HCT 41.4 36.4* 37.1* 34.6*  MCV 99.8 100.6* 101.6* 101.2*  PLT 137* 112* 161 109*  Basic Metabolic Panel: Recent Labs  Lab 03/21/24 0427 03/22/24 0826 03/23/24 0326 03/24/24 0045 03/25/24 0311  NA 134* 131* 133* 133* 133*  K 4.7 4.3 3.9 3.5 3.7  CL 97* 95* 98 98 98  CO2 32 29 27 26 27   GLUCOSE 87 127* 111* 133* 85  BUN 22* 28* 26* 25* 23*  CREATININE 0.90 0.98 0.90 1.00 1.44*  CALCIUM 8.6* 8.7* 8.6* 8.7* 8.4*  MG  --   --   --  2.1 2.3   Liver Function Tests: Recent Labs  Lab 03/20/24 0730 03/21/24 0427 03/22/24 0826 03/23/24 0326  AST 117* 124* 78* 116*  ALT 123* 124* 104* 114*  ALKPHOS 264* 250* 237* 255*  BILITOT 4.6* 4.8* 3.9* 3.0*  PROT 6.9 6.0* 6.6 6.0*  ALBUMIN  2.5* 2.3* 2.5* 2.4*   No results for input(s): LIPASE, AMYLASE in the last 168 hours. Recent Labs  Lab 03/20/24 1407 03/24/24 0045  AMMONIA 90* 47*   Cardiac Enzymes: No results for input(s): CKTOTAL, CKMB, CKMBINDEX, TROPONINI in the last 168 hours. BNP (last 3 results) Recent Labs    02/20/24 1723 03/09/24 1351  BNP 33.0 30.6    ProBNP (last 3 results) No results for input(s): PROBNP in the last 8760 hours.  CBG: No results for input(s): GLUCAP in the last 168 hours. Recent Results (from the past 240  hours)  Surgical pcr screen     Status: None   Collection Time: 03/21/24  4:42 AM   Specimen: Nasal Mucosa; Nasal Swab  Result Value Ref Range Status   MRSA, PCR NEGATIVE NEGATIVE Final   Staphylococcus aureus NEGATIVE NEGATIVE Final    Comment: (NOTE) The Xpert SA Assay (FDA approved for NASAL specimens in patients 66 years of age and older), is one component of a comprehensive surveillance program. It is not intended to diagnose infection nor to guide or monitor treatment. Performed at [facility], [address] [place], [place] [address]   Culture, blood (Routine X 2) w Reflex to ID Panel     Status: None (Preliminary result)   Collection Time: 03/21/24  7:18 AM   Specimen: BLOOD RIGHT ARM  Result Value Ref Range Status   Specimen Description   Final    BLOOD RIGHT ARM Performed at [facility], [address] [place], [place] [address]    Special Requests   Final    BOTTLES DRAWN AEROBIC AND ANAEROBIC Blood Culture adequate volume Performed at [facility], [address], [place], [place] [address]    Culture   Final    NO GROWTH 4 DAYS Performed at [facility], [address], [place], [place] [address]    Report Status PENDING  Incomplete  Culture, blood (Routine X 2) w Reflex to ID Panel     Status: None (Preliminary result)   Collection Time: 03/21/24  7:18 AM   Specimen: BLOOD RIGHT HAND  Result Value Ref Range Status   Specimen Description   Final    BLOOD RIGHT HAND Performed at [facility], [address], [place], [place] [address]    Special Requests   Final    BOTTLES DRAWN AEROBIC AND ANAEROBIC Blood Culture adequate volume Performed at [facility], [address], [place], [place] [address]    Culture   Final    NO GROWTH 4 DAYS Performed at [facility], [address], [place], [place] [address]    Report Status  PENDING  Incomplete  Aerobic/Anaerobic Culture w  Gram Stain (surgical/deep wound)     Status: None (Preliminary result)   Collection Time: 03/21/24  2:11 PM   Specimen: Bone; Tissue  Result Value Ref Range Status   Specimen Description   Final    BONE Performed at [facility], [address] [place], [place] [address]    Special Requests   Final    NONE Performed at [facility], [address], [place], [place] [address]    Gram Stain   Final    NO WBC SEEN NO ORGANISMS SEEN Performed at [facility], [address], [place], [place] [address]    Culture   Final    FEW STAPHYLOCOCCUS AUREUS NO ANAEROBES ISOLATED; CULTURE IN PROGRESS FOR 5 DAYS    Report Status PENDING  Incomplete   Organism ID, Bacteria STAPHYLOCOCCUS AUREUS  Final      Susceptibility   Staphylococcus aureus - MIC*    CIPROFLOXACIN <=0.5 SENSITIVE Sensitive     ERYTHROMYCIN  >=8 RESISTANT Resistant     GENTAMICIN <=0.5 SENSITIVE Sensitive     OXACILLIN 0.5 SENSITIVE Sensitive     TETRACYCLINE <=1 SENSITIVE Sensitive     VANCOMYCIN 1 SENSITIVE Sensitive     TRIMETH/SULFA <=10 SENSITIVE Sensitive     CLINDAMYCIN RESISTANT Resistant     RIFAMPIN <=0.5 SENSITIVE Sensitive     Inducible Clindamycin POSITIVE Resistant     LINEZOLID 2 SENSITIVE Sensitive     * FEW STAPHYLOCOCCUS AUREUS     Studies: No results found.     Derryl Duval, MD  [facility] 03/26/2024  If 7PM-7AM, please contact night-coverage

## 2024-03-26 NOTE — Progress Notes (Signed)
 Pt improving stating I feel a little better. Reassured pt and told to call if anything changes.

## 2024-03-26 NOTE — Plan of Care (Signed)
   Problem: Education: Goal: Knowledge of General Education information will improve Description Including pain rating scale, medication(s)/side effects and non-pharmacologic comfort measures Outcome: Progressing   Problem: Health Behavior/Discharge Planning: Goal: Ability to manage health-related needs will improve Outcome: Progressing

## 2024-03-27 DIAGNOSIS — T847XXA Infection and inflammatory reaction due to other internal orthopedic prosthetic devices, implants and grafts, initial encounter: Secondary | ICD-10-CM | POA: Diagnosis not present

## 2024-03-27 DIAGNOSIS — E871 Hypo-osmolality and hyponatremia: Secondary | ICD-10-CM | POA: Insufficient documentation

## 2024-03-27 LAB — COMPREHENSIVE METABOLIC PANEL WITH GFR
ALT: 72 U/L — ABNORMAL HIGH (ref 0–44)
AST: 126 U/L — ABNORMAL HIGH (ref 15–41)
Albumin: 2.4 g/dL — ABNORMAL LOW (ref 3.5–5.0)
Alkaline Phosphatase: 294 U/L — ABNORMAL HIGH (ref 38–126)
Anion gap: 8 (ref 5–15)
BUN: 38 mg/dL — ABNORMAL HIGH (ref 6–20)
CO2: 27 mmol/L (ref 22–32)
Calcium: 8.4 mg/dL — ABNORMAL LOW (ref 8.9–10.3)
Chloride: 98 mmol/L (ref 98–111)
Creatinine, Ser: 1.24 mg/dL (ref 0.61–1.24)
GFR, Estimated: >60 mL/min (ref >60)
Glucose, Bld: 73 mg/dL (ref 70–99)
Potassium: 3.9 mmol/L (ref 3.5–5.1)
Sodium: 133 mmol/L — ABNORMAL LOW (ref 135–145)
Total Bilirubin: 2.6 mg/dL — ABNORMAL HIGH (ref 0.0–1.2)
Total Protein: 6.1 g/dL — ABNORMAL LOW (ref 6.5–8.1)

## 2024-03-27 LAB — MAGNESIUM: Magnesium: 2.7 mg/dL — ABNORMAL HIGH (ref 1.7–2.4)

## 2024-03-27 NOTE — Progress Notes (Signed)
 PROGRESS NOTE  Thomas [name] FMW:[id number] DOB: Oct 24, 1975 DOA: 03/20/2024 PCP: Berneta Elsie Sayre, MD   LOS: 7 days   Brief narrative:  Thomas [name] is an 48 y.o. male past medical history significant for psoriatic arthritis obesity and alcoholic cirrhosis, history of right ankle fracture with hardware and surgical repair in July 2024, recently discharged from the hospital in September 2025 for hepatic hydrothorax and underwent thoracocentesis at that time who presented to the hospital with right ankle swelling and pain likely due to cellulitis in the setting of surgical hardware.    At this time, patient has undergone surgical intervention of the right ankle, plan is prolonged antibiotics through PICC line and is awaiting for CIR placement.  Patient however declines CIR transfer today.  He states he would like to go home  Assessment/Plan: Principal Problem:   Wound infection complicating hardware, initial encounter Active Problems:   Alcoholic cirrhosis of liver (HCC)   Class 3 severe obesity due to excess calories with body mass index (BMI) of 50.0 to 59.9 in adult (HCC)   Psoriatic arthritis (HCC)   Thrombocytopenia   Hydrothorax   Hyponatremia  Right ankle cellulitis with  wound infection complicating hardware, initial encounter Continue IV cefazolin , orthopedics was consulted and patient underwent washout and hardware removal.  Blood cultures negative in 4 days.  CRP of 4.6.  Blood cultures negative in 3 days.  Deep tissue culture with methicillin sensitive Staph aureus.  Infectious disease on board and l has undergone PICC line placement for prolonged IV antibiotics..  Continue pain management.  Orthopedic has seen the patient and recommend nonweightbearing in the right lower extremity in the splint.  Recommend aspirin 81 milligram twice daily for 4 weeks for DVT prophylaxis.  Plan is to keep the splint intact until follow-up with orthopedics in 2 weeks after surgery for wound  check and suture removal.   Alcoholic cirrhosis of liver with esophageal varices and portal hypertension: Continue nadolol , Protonix  and lactulose.  Currently receiving Aldactone  and Lasix  as well, creatinine bumped to 1.44->1.01->1.2 will hold diuretics today again.  Continue lactulose.  Titrate to bowel movements 2-3 a day.  Elevated liver enzymes: Likely secondary to his liver cirrhosis.  AST/ALT worsening baseline.  However is trending down.  AST 126, ALT 72, total bilirubin 2.6 on 10/19.  Recurrent hepatic hydrothorax: Patient remains stable breathing wise..  IR was consulted for thoracocentesis and 2.2 L of fluid was removed on 10/12.  Repeat chest x-ray with improved but persistent right effusion.  Saturating well on room air.  Not orthopneic.  Psoriatic arthritis: Steroids have been discontinued.   Class 3 severe obesity due to excess calories with body mass index (BMI) of 50.0 to 59.9 in adult  Would benefit from lifestyle modification as outpatient.   Thrombocytopenia Improved likely secondary to alcohol abuse.  Latest platelet count of 109  Debility deconditioning.  Physical therapy has seen the patient and recommended CIR     DVT prophylaxis: enoxaparin  (LOVENOX ) injection 40 mg Start: 03/22/24 0900 SCDs Start: 03/20/24 1148   Disposition: Initial plan was to transfer to CIR today.  Patient however wants to go home.  His father is ill in the hospital and says he would like to go home.  He says he has sufficient DME's at home from previous disability from ankle fracture.  He says he is not ready to discharge today.  Discussed that CIR would be a better option for him.  However if he would absolutely want to  go home we need to have IV antibiotics set up, home health.  Code Status:     Code Status: Full Code  Family Communication: None at bedside  Consultants: Infectious disease Orthopedics  Procedures: Right right ankle washout and hardware  removal Thoracocentesis  Anti-infectives:  Cefazolin  IV  Anti-infectives (From admission, onward)    Start     Dose/Rate Route Frequency Ordered Stop   03/24/24 1400  ceFAZolin  (ANCEF ) IVPB 2g/100 mL premix        2 g 200 mL/hr over 30 Minutes Intravenous Every 8 hours 03/24/24 0938     03/24/24 0000  ceFAZolin  (ANCEF ) IVPB        2 g Intravenous Every 8 hours 03/24/24 1123 05/02/24 2359   03/23/24 1200  cefTRIAXone  (ROCEPHIN ) 2 g in sodium chloride  0.9 % 100 mL IVPB  Status:  Discontinued        2 g 200 mL/hr over 30 Minutes Intravenous Every 24 hours 03/22/24 1448 03/24/24 0938   03/22/24 2100  vancomycin (VANCOCIN) IVPB 1000 mg/200 mL premix  Status:  Discontinued        1,000 mg 200 mL/hr over 60 Minutes Intravenous Every 12 hours 03/22/24 1006 03/24/24 0935   03/21/24 1408  vancomycin (VANCOCIN) powder  Status:  Discontinued          As needed 03/21/24 1408 03/21/24 1507   03/21/24 1200  cefTRIAXone  (ROCEPHIN ) 1 g in sodium chloride  0.9 % 100 mL IVPB  Status:  Discontinued        1 g 200 mL/hr over 30 Minutes Intravenous Every 24 hours 03/20/24 1818 03/22/24 1448   03/21/24 0900  vancomycin (VANCOREADY) IVPB 1250 mg/250 mL  Status:  Discontinued        1,250 mg 166.7 mL/hr over 90 Minutes Intravenous Every 12 hours 03/20/24 1832 03/22/24 1006   03/20/24 1930  vancomycin (VANCOREADY) IVPB 2000 mg/400 mL        2,000 mg 200 mL/hr over 120 Minutes Intravenous STAT 03/20/24 1832 03/20/24 2143   03/20/24 1115  cefTRIAXone  (ROCEPHIN ) 2 g in sodium chloride  0.9 % 100 mL IVPB        2 g 200 mL/hr over 30 Minutes Intravenous  Once 03/20/24 1105 03/20/24 1259        Subjective: Patient seen and examined at the bedside.  He reports some anxiety and feeling overall weak today.  Reports couple of bowel movements overnight.  Denies any abdominal pain.  His vital signs have been stable.  He is afebrile.    Objective: Vitals:   03/26/24 2033 03/27/24 0631  BP: (!) 108/59 119/62   Pulse: 79 72  Resp: 15 17  Temp: 98.3 F (36.8 C) 98.3 F (36.8 C)  SpO2: 97% 93%    Intake/Output Summary (Last 24 hours) at 03/27/2024 1355 Last data filed at 03/27/2024 0548 Gross per 24 hour  Intake 1110.49 ml  Output 350 ml  Net 760.49 ml   Filed Weights   03/21/24 1210  Weight: 119.7 kg   Body mass index is 43.93 kg/m.   Physical Exam: General: Obese built, not in obvious distress, on room air HENT:   No scleral pallor or icterus noted. Oral mucosa is moist.  Chest:    Diminished breath sounds bilaterally.  CVS: S1 &S2 heard. No murmur.  Regular rate and rhythm. Abdomen: Soft, nontender, bowel sounds are heard.   Extremities: Right lower extremity wrapped with/Ace Psych: Alert, awake and oriented, normal mood CNS:  No cranial nerve deficits.  Power equal in all extremities.   Skin: Warm and dry.  No rashes noted.  Data Review: I have personally reviewed the following laboratory data and studies,  CBC: Recent Labs  Lab 03/21/24 0427 03/24/24 0045 03/25/24 0311 03/26/24 1924  WBC 10.5 10.7* 9.0 8.8  NEUTROABS  --   --   --  6.2  HGB 11.9* 11.7* 11.0* 11.1*  HCT 36.4* 37.1* 34.6* 34.6*  MCV 100.6* 101.6* 101.2* 100.3*  PLT 112* 161 109* 118*   Basic Metabolic Panel: Recent Labs  Lab 03/23/24 0326 03/24/24 0045 03/25/24 0311 03/26/24 1420 03/27/24 0207  NA 133* 133* 133* 132* 133*  K 3.9 3.5 3.7 3.8 3.9  CL 98 98 98 98 98  CO2 27 26 27 27 27   GLUCOSE 111* 133* 85 110* 73  BUN 26* 25* 23* 36* 38*  CREATININE 0.90 1.00 1.44* 1.01 1.24  CALCIUM 8.6* 8.7* 8.4* 8.5* 8.4*  MG  --  2.1 2.3  --  2.7*   Liver Function Tests: Recent Labs  Lab 03/21/24 0427 03/22/24 0826 03/23/24 0326 03/26/24 1420 03/27/24 0207  AST 124* 78* 116* 150* 126*  ALT 124* 104* 114* 100* 72*  ALKPHOS 250* 237* 255* 327* 294*  BILITOT 4.8* 3.9* 3.0* 3.2* 2.6*  PROT 6.0* 6.6 6.0* 5.9* 6.1*  ALBUMIN  2.3* 2.5* 2.4* 2.3* 2.4*   No results for input(s): LIPASE,  AMYLASE in the last 168 hours. Recent Labs  Lab 03/20/24 1407 03/24/24 0045  AMMONIA 90* 47*   Cardiac Enzymes: No results for input(s): CKTOTAL, CKMB, CKMBINDEX, TROPONINI in the last 168 hours. BNP (last 3 results) Recent Labs    02/20/24 1723 03/09/24 1351  BNP 33.0 30.6    ProBNP (last 3 results) No results for input(s): PROBNP in the last 8760 hours.  CBG: No results for input(s): GLUCAP in the last 168 hours. Recent Results (from the past 240 hours)  Surgical pcr screen     Status: None   Collection Time: 03/21/24  4:42 AM   Specimen: Nasal Mucosa; Nasal Swab  Result Value Ref Range Status   MRSA, PCR NEGATIVE NEGATIVE Final   Staphylococcus aureus NEGATIVE NEGATIVE Final    Comment: (NOTE) The Xpert SA Assay (FDA approved for NASAL specimens in patients 57 years of age and older), is one component of a comprehensive surveillance program. It is not intended to diagnose infection nor to guide or monitor treatment. Performed at [facility], [address] [place], [place] [address]   Culture, blood (Routine X 2) w Reflex to ID Panel     Status: None   Collection Time: 03/21/24  7:18 AM   Specimen: BLOOD RIGHT ARM  Result Value Ref Range Status   Specimen Description   Final    BLOOD RIGHT ARM Performed at [facility], [address], [place], [place] [address]    Special Requests   Final    BOTTLES DRAWN AEROBIC AND ANAEROBIC Blood Culture adequate volume Performed at [facility], [address] [place], [place] [address]    Culture   Final    NO GROWTH 5 DAYS Performed at [facility], [address] [place], [place] [address]    Report Status 03/26/2024 FINAL  Final  Culture, blood (Routine X 2) w Reflex to ID Panel     Status: None   Collection Time: 03/21/24  7:18 AM   Specimen: BLOOD RIGHT HAND  Result Value Ref Range Status  Specimen Description   Final    BLOOD RIGHT  HAND Performed at [facility], [address], [place], [place] [address]    Special Requests   Final    BOTTLES DRAWN AEROBIC AND ANAEROBIC Blood Culture adequate volume Performed at [facility], [address], [place], [place] [address]    Culture   Final    NO GROWTH 5 DAYS Performed at [facility], [address] [place], [place] [address]    Report Status 03/26/2024 FINAL  Final  Aerobic/Anaerobic Culture w Gram Stain (surgical/deep wound)     Status: None   Collection Time: 03/21/24  2:11 PM   Specimen: Bone; Tissue  Result Value Ref Range Status   Specimen Description   Final    BONE Performed at [facility], [address] [place], [place] [address]    Special Requests   Final    NONE Performed at [facility], [address] [place], [place] [address]    Gram Stain NO WBC SEEN NO ORGANISMS SEEN   Final   Culture   Final    FEW STAPHYLOCOCCUS AUREUS NO ANAEROBES ISOLATED Performed at [facility], [address], [place], [place] [address]    Report Status 03/26/2024 FINAL  Final   Organism ID, Bacteria STAPHYLOCOCCUS AUREUS  Final      Susceptibility   Staphylococcus aureus - MIC*    CIPROFLOXACIN <=0.5 SENSITIVE Sensitive     ERYTHROMYCIN  >=8 RESISTANT Resistant     GENTAMICIN <=0.5 SENSITIVE Sensitive     OXACILLIN 0.5 SENSITIVE Sensitive     TETRACYCLINE <=1 SENSITIVE Sensitive     VANCOMYCIN 1 SENSITIVE Sensitive     TRIMETH/SULFA <=10 SENSITIVE Sensitive     CLINDAMYCIN RESISTANT Resistant     RIFAMPIN <=0.5 SENSITIVE Sensitive     Inducible Clindamycin POSITIVE Resistant     LINEZOLID 2 SENSITIVE Sensitive     * FEW STAPHYLOCOCCUS AUREUS     Studies: No results found.     Derryl Duval, MD  [facility] 03/27/2024  If 7PM-7AM, please contact night-coverage

## 2024-03-27 NOTE — Plan of Care (Signed)

## 2024-03-27 NOTE — TOC Progression Note (Signed)
 Transition of Care [facility]) - Progression Note    Patient Details  Name: Thomas [name] MRN: [id number] Date of Birth: 1976-01-22  Transition of Care [facility]) CM/SW Contact  Sonda Manuella Quill, RN Phone Number: 03/27/2024, 1:56 PM  Clinical Narrative:    Notified by Tinnie, Rehab Coordinator pt declined admit to CIR today; he will be contacted by [facility] tomorrow regarding CIR admission; IP CM following   Expected Discharge Plan: Skilled Nursing Facility (vs. CIR?) Barriers to Discharge: Continued Medical Work up, English as a second language teacher               Expected Discharge Plan and Services In-house Referral: Clinical Social Work     Living arrangements for the past 2 months: Single Family Home                                       Social Drivers of Health (SDOH) Interventions SDOH Screenings   Food Insecurity: No Food Insecurity (03/20/2024)  Housing: Unknown (03/20/2024)  Transportation Needs: No Transportation Needs (03/20/2024)  Utilities: Not At Risk (03/20/2024)  Depression (PHQ2-9): Low Risk  (07/28/2022)  Financial Resource Strain: Low Risk  (11/26/2023)  Physical Activity: Insufficiently Active (11/26/2023)  Social Connections: Socially Isolated (11/26/2023)  Stress: No Stress Concern Present (11/26/2023)  Tobacco Use: High Risk (03/21/2024)    Readmission Risk Interventions    03/23/2024    3:12 PM  Readmission Risk Prevention Plan  Transportation Screening Complete  PCP or Specialist Appt within 5-7 Days Complete  Home Care Screening Complete  Medication Review (RN CM) Complete

## 2024-03-27 NOTE — Plan of Care (Signed)
   Problem: Education: Goal: Knowledge of General Education information will improve Description Including pain rating scale, medication(s)/side effects and non-pharmacologic comfort measures Outcome: Progressing   Problem: Health Behavior/Discharge Planning: Goal: Ability to manage health-related needs will improve Outcome: Progressing

## 2024-03-27 NOTE — Progress Notes (Signed)
 Inpatient Rehab Admissions Coordinator:  Spoke with pt on the telephone. Pt would does not want to admit CIR today. Pt would like to discuss medications with Dr. Sigdel before agreeing to [facility] admission. Pt requested AC contact him tomorrow about CIR admission. Dr. Sigdel, NSG and [facility] made aware. Will continue to follow.  Tinnie Yvone Cohens, MS, CCC-SLP Admissions Coordinator [phone]

## 2024-03-28 DIAGNOSIS — T847XXA Infection and inflammatory reaction due to other internal orthopedic prosthetic devices, implants and grafts, initial encounter: Secondary | ICD-10-CM | POA: Diagnosis not present

## 2024-03-28 LAB — COMPREHENSIVE METABOLIC PANEL WITH GFR
ALT: 47 U/L — ABNORMAL HIGH (ref 0–44)
AST: 111 U/L — ABNORMAL HIGH (ref 15–41)
Albumin: 2.4 g/dL — ABNORMAL LOW (ref 3.5–5.0)
Alkaline Phosphatase: 288 U/L — ABNORMAL HIGH (ref 38–126)
Anion gap: 5 (ref 5–15)
BUN: 29 mg/dL — ABNORMAL HIGH (ref 6–20)
CO2: 28 mmol/L (ref 22–32)
Calcium: 8.4 mg/dL — ABNORMAL LOW (ref 8.9–10.3)
Chloride: 98 mmol/L (ref 98–111)
Creatinine, Ser: 0.89 mg/dL (ref 0.61–1.24)
GFR, Estimated: >60 mL/min (ref >60)
Glucose, Bld: 94 mg/dL (ref 70–99)
Potassium: 4 mmol/L (ref 3.5–5.1)
Sodium: 130 mmol/L — ABNORMAL LOW (ref 135–145)
Total Bilirubin: 3.8 mg/dL — ABNORMAL HIGH (ref 0.0–1.2)
Total Protein: 6.2 g/dL — ABNORMAL LOW (ref 6.5–8.1)

## 2024-03-28 MED ORDER — LACTULOSE 10 GM/15ML PO SOLN
15.0000 g | Freq: Every day | ORAL | Status: DC
Start: 2024-03-28 — End: 2024-03-30
  Administered 2024-03-29 – 2024-03-30 (×2): 15 g via ORAL
  Filled 2024-03-28 (×2): qty 30

## 2024-03-28 NOTE — Progress Notes (Addendum)
 PROGRESS NOTE  IZEAH [name] FMW:[id number] DOB: April 05, 1976 DOA: 03/20/2024 PCP: Berneta Elsie Sayre, MD   LOS: 8 days   Brief narrative:  Thomas [name] is an 48 y.o. male past medical history significant for psoriatic arthritis obesity and alcoholic cirrhosis, history of right ankle fracture with hardware and surgical repair in July 2024, recently discharged from the hospital in September 2025 for hepatic hydrothorax and underwent thoracocentesis at that time who presented to the hospital with right ankle swelling and pain likely due to cellulitis in the setting of surgical hardware.    At this time, patient has undergone irrigation debridement of right ankle infection with hardware removal and wound revision of the right ankle, plan is prolonged antibiotics through PICC line.  He was recommended and planned for discharge to CIR however he has declined rehab transfer and would like to go home.   Assessment/Plan: Principal Problem:   Wound infection complicating hardware, initial encounter Active Problems:   Alcoholic cirrhosis of liver (HCC)   Class 3 severe obesity due to excess calories with body mass index (BMI) of 50.0 to 59.9 in adult [facility])   Psoriatic arthritis (HCC)   Thrombocytopenia   Hydrothorax   Hyponatremia  Right ankle cellulitis with  wound infection complicating hardware, initial encounter Continue IV cefazolin , orthopedics was consulted and patient underwent washout and hardware removal on 03/20/2024.   Deep tissue culture with methicillin sensitive Staph aureus with negative blood culture.  Infectious disease on board, PICC line is in place for prolonged IV antibiotics, recommended for 4 to 6 weeks.  Continue pain management.  Orthopedic has seen the patient and recommend nonweightbearing in the right lower extremity in the splint.  Recommend aspirin 81 milligram twice daily for 4 weeks for DVT prophylaxis.  Plan is to keep the splint intact until follow-up with  orthopedics in 2 weeks after surgery for wound check and suture removal.   Alcoholic cirrhosis of liver with esophageal varices and portal hypertension: Continue nadolol , Protonix  and lactulose.  Currently receiving Aldactone  and Lasix  as well, creatinine bumped to 1.44->1.01->1.2 will hold diuretics today again.  Continue lactulose.  Titrate to bowel movements 2-3 a day. Patient requests decreasing lactulose dose, changed to 15 g daily  Elevated liver enzymes: Likely secondary to his liver cirrhosis.  AST/ALT worsening baseline.  However is trending down.  AST 126, ALT 72, total bilirubin 2.6 on 10/19. Will recheck.  Recurrent hepatic hydrothorax: Patient remains stable breathing wise..  IR was consulted for thoracocentesis and 2.2 L of fluid was removed on 10/12.  Repeat chest x-ray with improved but persistent right effusion.  Saturating well on room air.  Not orthopneic.  Psoriatic arthritis: Steroids have been discontinued.   Class 3 severe obesity due to excess calories with body mass index (BMI) of 50.0 to 59.9 in adult  Would benefit from lifestyle modification as outpatient.   Thrombocytopenia Improved likely secondary to alcohol abuse.  Latest platelet count of 109  Debility deconditioning.  Physical therapy has seen the patient and recommended CIR however patient now wants to go home with home health.     DVT prophylaxis: enoxaparin  (LOVENOX ) injection 40 mg Start: 03/22/24 0900 SCDs Start: 03/20/24 1148   Disposition: Initial plan was to transfer to CIR today.  Patient however wants to go home.  His father is ill in the hospital and says he would like to go home.  He says he has sufficient DME's at home from previous disability from ankle fracture.  He says  he is not ready to discharge today.  Discussed that CIR would be a better option for him.  However he would like to go home with home health set up.  TOC aware  Code Status:     Code Status: Full Code  Family  Communication: None at bedside  Consultants: Infectious disease Orthopedics  Procedures: Right right ankle washout and hardware removal Thoracocentesis  Anti-infectives:  Cefazolin  IV  Anti-infectives (From admission, onward)    Start     Dose/Rate Route Frequency Ordered Stop   03/24/24 1400  ceFAZolin  (ANCEF ) IVPB 2g/100 mL premix        2 g 200 mL/hr over 30 Minutes Intravenous Every 8 hours 03/24/24 0938     03/24/24 0000  ceFAZolin  (ANCEF ) IVPB        2 g Intravenous Every 8 hours 03/24/24 1123 05/02/24 2359   03/23/24 1200  cefTRIAXone  (ROCEPHIN ) 2 g in sodium chloride  0.9 % 100 mL IVPB  Status:  Discontinued        2 g 200 mL/hr over 30 Minutes Intravenous Every 24 hours 03/22/24 1448 03/24/24 0938   03/22/24 2100  vancomycin (VANCOCIN) IVPB 1000 mg/200 mL premix  Status:  Discontinued        1,000 mg 200 mL/hr over 60 Minutes Intravenous Every 12 hours 03/22/24 1006 03/24/24 0935   03/21/24 1408  vancomycin (VANCOCIN) powder  Status:  Discontinued          As needed 03/21/24 1408 03/21/24 1507   03/21/24 1200  cefTRIAXone  (ROCEPHIN ) 1 g in sodium chloride  0.9 % 100 mL IVPB  Status:  Discontinued        1 g 200 mL/hr over 30 Minutes Intravenous Every 24 hours 03/20/24 1818 03/22/24 1448   03/21/24 0900  vancomycin (VANCOREADY) IVPB 1250 mg/250 mL  Status:  Discontinued        1,250 mg 166.7 mL/hr over 90 Minutes Intravenous Every 12 hours 03/20/24 1832 03/22/24 1006   03/20/24 1930  vancomycin (VANCOREADY) IVPB 2000 mg/400 mL        2,000 mg 200 mL/hr over 120 Minutes Intravenous STAT 03/20/24 1832 03/20/24 2143   03/20/24 1115  cefTRIAXone  (ROCEPHIN ) 2 g in sodium chloride  0.9 % 100 mL IVPB        2 g 200 mL/hr over 30 Minutes Intravenous  Once 03/20/24 1105 03/20/24 1259        Subjective: Patient seen and examined at the bedside.  His vital signs have been stable.  He reiterates desire to go home as opposed to rehab.  Requests decreasing lactulose  dose.  Objective: Vitals:   03/28/24 0519 03/28/24 1315  BP: 108/60 117/71  Pulse: 73 63  Resp: 17   Temp: (!) 97.5 F (36.4 C)   SpO2: 96% 95%    Intake/Output Summary (Last 24 hours) at 03/28/2024 1411 Last data filed at 03/28/2024 1200 Gross per 24 hour  Intake 1478.41 ml  Output 400 ml  Net 1078.41 ml   Filed Weights   03/21/24 1210  Weight: 119.7 kg   Body mass index is 43.93 kg/m.   Physical Exam: General: Obese built, not in obvious distress, on room air HENT:   No scleral pallor or icterus noted. Oral mucosa is moist.  Chest:    Diminished breath sounds bilaterally.  CVS: S1 &S2 heard. No murmur.  Regular rate and rhythm. Abdomen: Soft, nontender, bowel sounds are heard.   Extremities: Right lower extremity wrapped with surgical bandage/cast//Ace Psych: Alert, awake and oriented,  normal mood CNS:  No cranial nerve deficits.  Power equal in all extremities.   Skin: Warm and dry.  No rashes noted.  Data Review: I have personally reviewed the following laboratory data and studies,  CBC: Recent Labs  Lab 03/24/24 0045 03/25/24 0311 03/26/24 1924  WBC 10.7* 9.0 8.8  NEUTROABS  --   --  6.2  HGB 11.7* 11.0* 11.1*  HCT 37.1* 34.6* 34.6*  MCV 101.6* 101.2* 100.3*  PLT 161 109* 118*   Basic Metabolic Panel: Recent Labs  Lab 03/24/24 0045 03/25/24 0311 03/26/24 1420 03/27/24 0207 03/28/24 1305  NA 133* 133* 132* 133* 130*  K 3.5 3.7 3.8 3.9 4.0  CL 98 98 98 98 98  CO2 26 27 27 27 28   GLUCOSE 133* 85 110* 73 94  BUN 25* 23* 36* 38* 29*  CREATININE 1.00 1.44* 1.01 1.24 0.89  CALCIUM 8.7* 8.4* 8.5* 8.4* 8.4*  MG 2.1 2.3  --  2.7*  --    Liver Function Tests: Recent Labs  Lab 03/22/24 0826 03/23/24 0326 03/26/24 1420 03/27/24 0207 03/28/24 1305  AST 78* 116* 150* 126* 111*  ALT 104* 114* 100* 72* 47*  ALKPHOS 237* 255* 327* 294* 288*  BILITOT 3.9* 3.0* 3.2* 2.6* 3.8*  PROT 6.6 6.0* 5.9* 6.1* 6.2*  ALBUMIN  2.5* 2.4* 2.3* 2.4* 2.4*   No  results for input(s): LIPASE, AMYLASE in the last 168 hours. Recent Labs  Lab 03/24/24 0045  AMMONIA 47*   Cardiac Enzymes: No results for input(s): CKTOTAL, CKMB, CKMBINDEX, TROPONINI in the last 168 hours. BNP (last 3 results) Recent Labs    02/20/24 1723 03/09/24 1351  BNP 33.0 30.6    ProBNP (last 3 results) No results for input(s): PROBNP in the last 8760 hours.  CBG: No results for input(s): GLUCAP in the last 168 hours. Recent Results (from the past 240 hours)  Surgical pcr screen     Status: None   Collection Time: 03/21/24  4:42 AM   Specimen: Nasal Mucosa; Nasal Swab  Result Value Ref Range Status   MRSA, PCR NEGATIVE NEGATIVE Final   Staphylococcus aureus NEGATIVE NEGATIVE Final    Comment: (NOTE) The Xpert SA Assay (FDA approved for NASAL specimens in patients 42 years of age and older), is one component of a comprehensive surveillance program. It is not intended to diagnose infection nor to guide or monitor treatment. Performed at [facility], [address] [place], [place] [address]   Culture, blood (Routine X 2) w Reflex to ID Panel     Status: None   Collection Time: 03/21/24  7:18 AM   Specimen: BLOOD RIGHT ARM  Result Value Ref Range Status   Specimen Description   Final    BLOOD RIGHT ARM Performed at [facility], [address], [place], [place] [address]    Special Requests   Final    BOTTLES DRAWN AEROBIC AND ANAEROBIC Blood Culture adequate volume Performed at [facility], [address] [place], [place] [address]    Culture   Final    NO GROWTH 5 DAYS Performed at [facility], [address], [place], [place] [address]    Report Status 03/26/2024 FINAL  Final  Culture, blood (Routine X 2) w Reflex to ID Panel     Status: None   Collection Time: 03/21/24  7:18 AM   Specimen: BLOOD RIGHT HAND  Result Value Ref Range Status   Specimen Description  Final    BLOOD  RIGHT HAND Performed at [facility], [address] [place], [place] [address]    Special Requests   Final    BOTTLES DRAWN AEROBIC AND ANAEROBIC Blood Culture adequate volume Performed at [facility], [address] [place], [place] [address]    Culture   Final    NO GROWTH 5 DAYS Performed at [facility], [address], [place], [place] [address]    Report Status 03/26/2024 FINAL  Final  Aerobic/Anaerobic Culture w Gram Stain (surgical/deep wound)     Status: None   Collection Time: 03/21/24  2:11 PM   Specimen: Bone; Tissue  Result Value Ref Range Status   Specimen Description   Final    BONE Performed at [facility], [address], [place], [place] [address]    Special Requests   Final    NONE Performed at [facility], [address] [place], [place] [address]    Gram Stain NO WBC SEEN NO ORGANISMS SEEN   Final   Culture   Final    FEW STAPHYLOCOCCUS AUREUS NO ANAEROBES ISOLATED Performed at [facility], [address], [place], [place] [address]    Report Status 03/26/2024 FINAL  Final   Organism ID, Bacteria STAPHYLOCOCCUS AUREUS  Final      Susceptibility   Staphylococcus aureus - MIC*    CIPROFLOXACIN <=0.5 SENSITIVE Sensitive     ERYTHROMYCIN  >=8 RESISTANT Resistant     GENTAMICIN <=0.5 SENSITIVE Sensitive     OXACILLIN 0.5 SENSITIVE Sensitive     TETRACYCLINE <=1 SENSITIVE Sensitive     VANCOMYCIN 1 SENSITIVE Sensitive     TRIMETH/SULFA <=10 SENSITIVE Sensitive     CLINDAMYCIN RESISTANT Resistant     RIFAMPIN <=0.5 SENSITIVE Sensitive     Inducible Clindamycin POSITIVE Resistant     LINEZOLID 2 SENSITIVE Sensitive     * FEW STAPHYLOCOCCUS AUREUS     Studies: No results found.     Derryl Duval, MD  [facility] 03/28/2024  If 7PM-7AM, please contact night-coverage

## 2024-03-28 NOTE — Progress Notes (Signed)
 PT Cancellation Note  Patient Details Name: MAXAMILLIAN TIENDA MRN: [id number] DOB: 1975/08/23   Cancelled Treatment:    Reason Eval/Treat Not Completed: Other (comment) (pt declined PT, his lunch just arrived. Pt stated he plans to DC home and stated a ramp is being installed, and a hospital bed is being delivered. He stated feels he can manage at home. Will follow.)  Sylvan Delon Copp PT 03/28/2024  Acute Rehabilitation Services  Office [phone]

## 2024-03-28 NOTE — Plan of Care (Signed)
   Problem: Education: Goal: Knowledge of General Education information will improve Description: Including pain rating scale, medication(s)/side effects and non-pharmacologic comfort measures Outcome: Progressing   Problem: Activity: Goal: Risk for activity intolerance will decrease Outcome: Progressing

## 2024-03-28 NOTE — TOC Progression Note (Signed)
 Transition of Care [facility]) - Progression Note    Patient Details  Name: Thomas [name] MRN: [id number] Date of Birth: 1976-05-01  Transition of Care [facility]) CM/SW Contact  Heather DELENA Saltness, LCSW Phone Number: 03/28/2024, 9:44 AM  Clinical Narrative:    CSW met with pt at bedside to discuss discharge planning. Pt reports wanting to discharge home with home health instead of admitting to CIR. Pt discharging home on IV antibiotics. CSW sent referral to Holley Herring at [facility]. HH RN for IV antibiotics set up with [facility]. HH PT/OT set up with [facility]. Pt reports he has RW, wheelchair, ramp, and BSC at home. Pt inquired about hospital bed. Pt reports he used to rent hospital bed from [facility]. CSW sent referral for hospital bed to [facility]. CSW spoke to [name] at [facility], who confirmed ability to deliver hospital bed to pt's house. Pt reports his sister or brother-in-law will provide transportation at discharge. TOC will continue to follow.   Expected Discharge Plan: Skilled Nursing Facility (vs. CIR?) Barriers to Discharge: Continued Medical Work up, English as a second language teacher   Expected Discharge Plan and Services In-house Referral: Clinical Social Work     Living arrangements for the past 2 months: Single Family Home                   Social Drivers of Health (SDOH) Interventions SDOH Screenings   Food Insecurity: No Food Insecurity (03/20/2024)  Housing: Unknown (03/20/2024)  Transportation Needs: No Transportation Needs (03/20/2024)  Utilities: Not At Risk (03/20/2024)  Depression (PHQ2-9): Low Risk  (07/28/2022)  Financial Resource Strain: Low Risk  (11/26/2023)  Physical Activity: Insufficiently Active (11/26/2023)  Social Connections: Socially Isolated (11/26/2023)  Stress: No Stress Concern Present (11/26/2023)  Tobacco Use: High Risk (03/21/2024)    Readmission Risk Interventions    03/23/2024    3:12 PM  Readmission Risk Prevention Plan  Transportation  Screening Complete  PCP or Specialist Appt within 5-7 Days Complete  Home Care Screening Complete  Medication Review (RN CM) Complete    Signed: Heather Saltness, MSW, LCSW Clinical Social Worker Inpatient Care Management 03/28/2024 12:54 PM

## 2024-03-28 NOTE — Progress Notes (Addendum)
 Occupational Therapy Treatment Patient Details Name: Thomas [name] MRN: [id number] DOB: Oct 08, 1975 Today's Date: 03/28/2024   History of present illness 48 y.o. male who comes in right ankle swelling and pain likely due to cellulitis in the setting of surgical hardware. S/p R ankle I&D for osteomyelitis and hardware removal 03/21/24. Past medical history significant for psoriatic arthritis obesity and alcoholic cirrhosis, history of right ankle fracture with hardware and surgical repair in July 2024, recently discharged from the hospital in September 2025 for hepatic hydrothorax underwent thoracocentesis at that time   OT comments  Patient seen for skilled OT session this am. Patient now requesting to discharge to home with home health vs AIR. Patient continues to make gains with basic mobility and ADL's from both seated and short distance RW/w/c level with NWB R LE. Patient reports he is in contact with medical supply for portable ramp and is requesting hospital bed so to remain on 1st floor of home. TOC and team secure chat re: request. See below for current status and OT continues to feel patient will benefit from intensive inpatient follow-up therapy, >3 hours/day but if home is ordered by MD, HHOT services needed. Patient requires continued Acute care hospital level OT services to progress safety and functional performance and allow for discharge.        If plan is discharge home, recommend the following:  Two people to help with walking and/or transfers;A little help with bathing/dressing/bathroom;Assistance with cooking/housework;Assist for transportation;Help with stairs or ramp for entrance   Equipment Recommendations  Hospital bed (patient requesting to stay on 1st floor of home and ramp being installed by [facility])       Precautions / Restrictions Precautions Precautions: Fall Recall of Precautions/Restrictions: Intact Restrictions Weight Bearing Restrictions Per Provider  Order: Yes RLE Weight Bearing Per Provider Order: Non weight bearing       Mobility Bed Mobility Overal bed mobility:  (patient was up in recliner and remained post session)                  Transfers Overall transfer level: Needs assistance Equipment used: Rolling walker (2 wheels), None Transfers: Sit to/from Stand, Bed to chair/wheelchair/BSC Sit to Stand: Contact guard assist Stand pivot transfers: Contact guard assist         General transfer comment: sink side STSs for am self care     Balance Overall balance assessment: Needs assistance Sitting-balance support: No upper extremity supported, Feet supported Sitting balance-Leahy Scale: Good     Standing balance support: Single extremity supported Standing balance-Leahy Scale: Fair                             ADL either performed or assessed with clinical judgement   ADL Overall ADL's : Needs assistance/impaired         Upper Body Bathing: Set up;Sitting   Lower Body Bathing: Minimal assistance;Sit to/from stand   Upper Body Dressing : Contact guard assist;Sitting   Lower Body Dressing: Minimal assistance       Toileting- Clothing Manipulation and Hygiene: Contact guard assist       Functional mobility during ADLs: Contact guard assist;Rolling walker (2 wheels) General ADL Comments: issued and trained in 5 Ps Handout for ECT    Extremity/Trunk Assessment Upper Extremity Assessment Upper Extremity Assessment: Overall WFL for tasks assessed;Right hand dominant   Lower Extremity Assessment Lower Extremity Assessment: Defer to PT evaluation  Communication Communication Communication: No apparent difficulties   Cognition Arousal: Alert Behavior During Therapy: WFL for tasks assessed/performed Cognition: No apparent impairments                               Following commands: Intact        Cueing   Cueing Techniques: Verbal cues         General Comments activity tolerance WFL for seated and intermittent standing supported sink side    Pertinent Vitals/ Pain       Pain Assessment Pain Assessment: 0-10 Pain Score: 6  Pain Location: R ankle Pain Descriptors / Indicators: Throbbing, Discomfort, Dull Pain Intervention(s): Monitored during session, Repositioned, Patient requesting pain meds-RN notified   Frequency  Min 2X/week        Progress Toward Goals  OT Goals(current goals can now be found in the care plan section)  Progress towards OT goals: Progressing toward goals  Acute Rehab OT Goals Patient Stated Goal: wants to go home now OT Goal Formulation: With patient Time For Goal Achievement: 04/07/24 Potential to Achieve Goals: Good ADL Goals Pt Will Perform Lower Body Bathing: with supervision;with adaptive equipment;sit to/from stand Pt Will Perform Lower Body Dressing: with supervision;with adaptive equipment;sit to/from stand Pt Will Transfer to Toilet: with supervision;ambulating;regular height toilet Pt Will Perform Toileting - Clothing Manipulation and hygiene: with set-up;sitting/lateral leans Additional ADL Goal #1: Patient will improve standing tolerance to 5 min for BADL's  Plan         AM-PAC OT 6 Clicks Daily Activity     Outcome Measure   Help from another person eating meals?: None Help from another person taking care of personal grooming?: None Help from another person toileting, which includes using toliet, bedpan, or urinal?: A Little Help from another person bathing (including washing, rinsing, drying)?: A Little Help from another person to put on and taking off regular upper body clothing?: None Help from another person to put on and taking off regular lower body clothing?: A Little 6 Click Score: 21    End of Session Equipment Utilized During Treatment: Gait belt;Rolling walker (2 wheels)  OT Visit Diagnosis: Unsteadiness on feet (R26.81);Pain Pain - Right/Left: Right Pain  - part of body: Ankle and joints of foot   Activity Tolerance Patient tolerated treatment well   Patient Left in chair;with call bell/phone within reach;Other (comment) (nursing aware patient bedside with father in rm [address])   Nurse Communication          Time: [phone] OT Time Calculation (min): 26 min  Charges: OT General Charges $OT Visit: 1 Visit OT Treatments $Self Care/Home Management : 8-22 mins $Therapeutic Activity: 8-22 mins  Lavender Stanke OT/L Acute Rehabilitation Department  [phone]  03/28/2024, 12:48 PM

## 2024-03-28 NOTE — Progress Notes (Signed)
 Inpatient Rehab Admissions Coordinator:  Spoke with pt on the phone. He has decided that he would prefer to go home with [facility] instead of admitting to CIR. TOC made aware. AC will sign off.   Tinnie Yvone Cohens, MS, CCC-SLP Admissions Coordinator [phone]

## 2024-03-29 DIAGNOSIS — T847XXA Infection and inflammatory reaction due to other internal orthopedic prosthetic devices, implants and grafts, initial encounter: Secondary | ICD-10-CM | POA: Diagnosis not present

## 2024-03-29 NOTE — TOC Progression Note (Signed)
 Transition of Care [facility]) - Progression Note    Patient Details  Name: Thomas [name] MRN: [id number] Date of Birth: 01/28/1976  Transition of Care [facility]) CM/SW Contact  NORMAN ASPEN, LCSW Phone Number: 03/29/2024, 3:39 PM  Clinical Narrative:     Per MD, pt may be medically ready for dc home tomorrow.  Have spoken with pt about need to set up delivery of hospital bed and that someone will need to be at the home to accept and he understands.  Have requested that [employer]) call pt on his cell to coordinate delivery times for tomorrow.  Have alerted [employer] and [name] as well of anticipated dc tomorrow.  Expected Discharge Plan: Skilled Nursing Facility (vs. CIR?) Barriers to Discharge: Barriers Resolved               Expected Discharge Plan and Services In-house Referral: Clinical Social Work     Living arrangements for the past 2 months: Single Family Home                 DME Arranged: Hospital bed DME Agency: [employer] Date DME Agency Contacted: 03/28/24 Time DME Agency Contacted: 1251 Representative spoke with at DME Agency: [place] HH Arranged: PT, OT, RN HH Agency: [facility] Date [facility] Agency Contacted: 03/28/24 Time HH Agency Contacted: 1251 Representative spoke with at [facility] Agency: Cindie at [place] / [place]ley at [employer]   Social Drivers of Health (SDOH) Interventions SDOH Screenings   Food Insecurity: No Food Insecurity (03/20/2024)  Housing: Unknown (03/20/2024)  Transportation Needs: No Transportation Needs (03/20/2024)  Utilities: Not At Risk (03/20/2024)  Depression (PHQ2-9): Low Risk  (07/28/2022)  Financial Resource Strain: Low Risk  (11/26/2023)  Physical Activity: Insufficiently Active (11/26/2023)  Social Connections: Socially Isolated (11/26/2023)  Stress: No Stress Concern Present (11/26/2023)  Tobacco Use: High Risk (03/21/2024)    Readmission Risk Interventions    03/23/2024    3:12 PM  Readmission Risk Prevention Plan   Transportation Screening Complete  PCP or Specialist Appt within 5-7 Days Complete  Home Care Screening Complete  Medication Review (RN CM) Complete

## 2024-03-29 NOTE — Progress Notes (Signed)
 Pt is visiting father in room 1312

## 2024-03-29 NOTE — Progress Notes (Signed)
 Physical Therapy Treatment Patient Details Name: Thomas [name] MRN: [id number] DOB: [birth year] Today's Date: 03/29/2024   History of Present Illness 48 y.o. male who comes in right ankle swelling and pain likely due to cellulitis in the setting of surgical hardware. S/p R ankle I&D for osteomyelitis and hardware removal 03/21/24. Past medical history significant for psoriatic arthritis obesity and alcoholic cirrhosis, history of right ankle fracture with hardware and surgical repair in July 2024, recently discharged from the hospital in September 2025 for hepatic hydrothorax underwent thoracocentesis at that time    PT Comments  Pt very cooperative and up to ambulate limited distance in hall - pt limited by pain and somewhat emotional regarding family members illness.  Will follow.    If plan is discharge home, recommend the following: A little help with bathing/dressing/bathroom;Assistance with cooking/housework;Assist for transportation;Help with stairs or ramp for entrance   Can travel by private vehicle     Yes  Equipment Recommendations  Hospital bed    Recommendations for Other Services       Precautions / Restrictions Precautions Precautions: Fall Recall of Precautions/Restrictions: Intact Restrictions Weight Bearing Restrictions Per Provider Order: Yes RLE Weight Bearing Per Provider Order: Non weight bearing     Mobility  Bed Mobility Overal bed mobility: Modified Independent                  Transfers Overall transfer level: Needs assistance Equipment used: Rolling walker (2 wheels) Transfers: Sit to/from Stand Sit to Stand: Supervision           General transfer comment: cues for LE management and use of UEs to self assist    Ambulation/Gait Ambulation/Gait assistance: Contact guard assist Gait Distance (Feet): 36 Feet Assistive device: Rolling walker (2 wheels) Gait Pattern/deviations: Step-to pattern Gait velocity: decreased     General  Gait Details: cues for slow pace for safety; distance pain limited   Stairs             Wheelchair Mobility     Tilt Bed    Modified Rankin (Stroke Patients Only)       Balance Overall balance assessment: Needs assistance Sitting-balance support: No upper extremity supported, Feet supported Sitting balance-Leahy Scale: Good     Standing balance support: Single extremity supported Standing balance-Leahy Scale: Fair                              Hotel manager: No apparent difficulties  Cognition Arousal: Alert Behavior During Therapy: WFL for tasks assessed/performed   PT - Cognitive impairments: No apparent impairments                       PT - Cognition Comments: AxO x 3 pleasant and motivated.  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  Pt was IND/working/driving. Following commands: Intact      Cueing Cueing Techniques: Verbal cues  Exercises      General Comments        Pertinent Vitals/Pain Pain Assessment Pain Assessment: 0-10 Pain Score: 8  Pain Location: R ankle Pain Descriptors / Indicators: Throbbing, Discomfort, Dull Pain Intervention(s): Limited activity within patient's tolerance, Monitored during session, Other (comment) (Pt declined premed 2* ongoing decisions for ailing father and wanting to stay clear headed)    Home Living  Prior Function            PT Goals (current goals can now be found in the care plan section) Acute Rehab PT Goals Patient Stated Goal: wants to go to rehab as he doesn't have help available at home (father with whom he lives is in hospital) PT Goal Formulation: With patient Time For Goal Achievement: 04/05/24 Potential to Achieve Goals: Good Progress towards PT goals: Progressing toward goals    Frequency    Min 3X/week      PT Plan      Co-evaluation              AM-PAC PT 6 Clicks Mobility   Outcome Measure   Help needed turning from your back to your side while in a flat bed without using bedrails?: None Help needed moving from lying on your back to sitting on the side of a flat bed without using bedrails?: None Help needed moving to and from a bed to a chair (including a wheelchair)?: A Little Help needed standing up from a chair using your arms (e.g., wheelchair or bedside chair)?: A Little Help needed to walk in hospital room?: A Little Help needed climbing 3-5 steps with a railing? : Total 6 Click Score: 18    End of Session Equipment Utilized During Treatment: Gait belt Activity Tolerance: Patient limited by pain Patient left: in chair Nurse Communication: Mobility status PT Visit Diagnosis: Difficulty in walking, not elsewhere classified (R26.2);Pain Pain - Right/Left: Right Pain - part of body: Ankle and joints of foot     Time: 1242-1258 PT Time Calculation (min) (ACUTE ONLY): 16 min  Charges:    $Gait Training: 8-22 mins PT General Charges $$ ACUTE PT VISIT: 1 Visit                     [facility] PT Acute Rehabilitation Services Office [phone]    Keithon Mccoin 03/29/2024, 1:11 PM

## 2024-03-29 NOTE — Progress Notes (Addendum)
 PROGRESS NOTE  Thomas [name] FMW:[id number] DOB: [birth year] DOA: 03/20/2024 PCP: Berneta Elsie Sayre, MD   LOS: 9 days   Brief narrative: Thomas [name] is an 48 y.o. male past medical history significant for psoriatic arthritis obesity and alcoholic cirrhosis, history of right ankle fracture with hardware and surgical repair in July 2024, recently discharged from the hospital in September 2025 for hepatic hydrothorax and underwent thoracocentesis at that time who presented to the hospital with right ankle swelling and pain likely due to cellulitis in the setting of surgical hardware.    At this time, patient has undergone irrigation debridement of right ankle infection with hardware removal and wound revision of the right ankle, plan is prolonged antibiotics through PICC line.  He was recommended and planned for discharge to CIR however he has declined rehab transfer and would like to go home.   Assessment/Plan: Principal Problem:   Wound infection complicating hardware, initial encounter Active Problems:   Alcoholic cirrhosis of liver (HCC)   Class 3 severe obesity due to excess calories with body mass index (BMI) of 50.0 to 59.9 in adult [facility])   Psoriatic arthritis (HCC)   Thrombocytopenia   Hydrothorax   Hyponatremia  Right ankle cellulitis with  wound infection complicating hardware, initial encounter Continue IV cefazolin , orthopedics was consulted and patient underwent washout and hardware removal on 03/20/2024.   Deep tissue culture with methicillin sensitive Staph aureus with negative blood culture.  Infectious disease on board, PICC line is in place for prolonged IV antibiotics, recommended for 4 to 6 weeks.  Continue pain management.  Orthopedic has seen the patient and recommend nonweightbearing in the right lower extremity in the splint.  Recommend aspirin 81 milligram twice daily for 4 weeks for DVT prophylaxis.  Plan is to keep the splint intact until follow-up with  orthopedics in 2 weeks after surgery for wound check and suture removal.    AKI Pre renal in nature, Elevated Scr in setting of ongoing diuresis which has been held.Avoid nephrotoxins, monitor trends   Alcoholic cirrhosis of liver with esophageal varices and portal hypertension: Continue nadolol , Protonix  and lactulose.  Currently receiving Aldactone  and Lasix  as well, creatinine bumped to 1.44->1.01->1.2 will hold diuretics today again.  Continue lactulose.  Titrate to bowel movements 2-3 a day. Patient requests decreasing lactulose dose, changed to 15 g daily  Elevated liver enzymes: Likely secondary to his liver cirrhosis.  AST/ALT worsening baseline.  However is trending down.  AST 126, ALT 72, total bilirubin 2.6 on 10/19. Will recheck.  Recurrent hepatic hydrothorax: Patient remains stable breathing wise..  IR was consulted for thoracocentesis and 2.2 L of fluid was removed on 10/12.  Repeat chest x-ray with improved but persistent right effusion.  Saturating well on room air.  Not orthopneic.  Psoriatic arthritis: Steroids have been discontinued.   Class 3 severe obesity due to excess calories with body mass index (BMI) of 50.0 to 59.9 in adult  Would benefit from lifestyle modification as outpatient.   Thrombocytopenia Improved likely secondary to alcohol abuse.  Latest platelet count of 109  Debility deconditioning.  Physical therapy has seen the patient and recommended CIR however patient now wants to go home with home health.     DVT prophylaxis: enoxaparin  (LOVENOX ) injection 40 mg Start: 03/22/24 0900 SCDs Start: 03/20/24 1148   Disposition: Initial plan was to transfer to CIR today.  Patient however wants to go home.  His father is ill in the hospital and says he would like  to go home.  He says he has sufficient DME's at home from previous disability from ankle fracture.  He says he is not ready to discharge today.  Discussed that CIR would be a better option for him.   However he would like to go home with home health set up.  TOC aware  Code Status:     Code Status: Full Code  Family Communication: None at bedside  Consultants: Infectious disease Orthopedics  Procedures: Right right ankle washout and hardware removal Thoracocentesis  Anti-infectives:  Cefazolin  IV  Anti-infectives (From admission, onward)    Start     Dose/Rate Route Frequency Ordered Stop   03/24/24 1400  ceFAZolin  (ANCEF ) IVPB 2g/100 mL premix        2 g 200 mL/hr over 30 Minutes Intravenous Every 8 hours 03/24/24 0938     03/24/24 0000  ceFAZolin  (ANCEF ) IVPB        2 g Intravenous Every 8 hours 03/24/24 1123 05/02/24 2359   03/23/24 1200  cefTRIAXone  (ROCEPHIN ) 2 g in sodium chloride  0.9 % 100 mL IVPB  Status:  Discontinued        2 g 200 mL/hr over 30 Minutes Intravenous Every 24 hours 03/22/24 1448 03/24/24 0938   03/22/24 2100  vancomycin (VANCOCIN) IVPB 1000 mg/200 mL premix  Status:  Discontinued        1,000 mg 200 mL/hr over 60 Minutes Intravenous Every 12 hours 03/22/24 1006 03/24/24 0935   03/21/24 1408  vancomycin (VANCOCIN) powder  Status:  Discontinued          As needed 03/21/24 1408 03/21/24 1507   03/21/24 1200  cefTRIAXone  (ROCEPHIN ) 1 g in sodium chloride  0.9 % 100 mL IVPB  Status:  Discontinued        1 g 200 mL/hr over 30 Minutes Intravenous Every 24 hours 03/20/24 1818 03/22/24 1448   03/21/24 0900  vancomycin (VANCOREADY) IVPB 1250 mg/250 mL  Status:  Discontinued        1,250 mg 166.7 mL/hr over 90 Minutes Intravenous Every 12 hours 03/20/24 1832 03/22/24 1006   03/20/24 1930  vancomycin (VANCOREADY) IVPB 2000 mg/400 mL        2,000 mg 200 mL/hr over 120 Minutes Intravenous STAT 03/20/24 1832 03/20/24 2143   03/20/24 1115  cefTRIAXone  (ROCEPHIN ) 2 g in sodium chloride  0.9 % 100 mL IVPB        2 g 200 mL/hr over 30 Minutes Intravenous  Once 03/20/24 1105 03/20/24 1259        Subjective: Patient seen and examined at the bedside.  His  vital signs have been stable.  He reiterates desire to go home as opposed to rehab. Plan for DC tommorow  Objective: Vitals:   03/28/24 2213 03/29/24 0644  BP: (!) 109/54 114/64  Pulse: 74 73  Resp: 18 16  Temp: 98.2 F (36.8 C) 98.1 F (36.7 C)  SpO2: 97% 94%    Intake/Output Summary (Last 24 hours) at 03/29/2024 0842 Last data filed at 03/29/2024 0751 Gross per 24 hour  Intake 2097.15 ml  Output 775 ml  Net 1322.15 ml   Filed Weights   03/21/24 1210  Weight: 119.7 kg   Body mass index is 43.93 kg/m.   Physical Exam: General: Obese built, not in obvious distress, on room air HENT:   No scleral pallor or icterus noted. Oral mucosa is moist.  Chest:    Diminished breath sounds bilaterally.  CVS: S1 &S2 heard. No murmur.  Regular rate and  rhythm. Abdomen: Soft, nontender, bowel sounds are heard.   Extremities: Right lower extremity wrapped with surgical bandage/cast//Ace Psych: Alert, awake and oriented, normal mood CNS:  No cranial nerve deficits.  Power equal in all extremities.   Skin: Warm and dry.  No rashes noted.  Data Review: I have personally reviewed the following laboratory data and studies,  CBC: Recent Labs  Lab 03/24/24 0045 03/25/24 0311 03/26/24 1924  WBC 10.7* 9.0 8.8  NEUTROABS  --   --  6.2  HGB 11.7* 11.0* 11.1*  HCT 37.1* 34.6* 34.6*  MCV 101.6* 101.2* 100.3*  PLT 161 109* 118*   Basic Metabolic Panel: Recent Labs  Lab 03/24/24 0045 03/25/24 0311 03/26/24 1420 03/27/24 0207 03/28/24 1305  NA 133* 133* 132* 133* 130*  K 3.5 3.7 3.8 3.9 4.0  CL 98 98 98 98 98  CO2 26 27 27 27 28   GLUCOSE 133* 85 110* 73 94  BUN 25* 23* 36* 38* 29*  CREATININE 1.00 1.44* 1.01 1.24 0.89  CALCIUM 8.7* 8.4* 8.5* 8.4* 8.4*  MG 2.1 2.3  --  2.7*  --    Liver Function Tests: Recent Labs  Lab 03/23/24 0326 03/26/24 1420 03/27/24 0207 03/28/24 1305  AST 116* 150* 126* 111*  ALT 114* 100* 72* 47*  ALKPHOS 255* 327* 294* 288*  BILITOT 3.0* 3.2*  2.6* 3.8*  PROT 6.0* 5.9* 6.1* 6.2*  ALBUMIN  2.4* 2.3* 2.4* 2.4*   No results for input(s): LIPASE, AMYLASE in the last 168 hours. Recent Labs  Lab 03/24/24 0045  AMMONIA 47*   Cardiac Enzymes: No results for input(s): CKTOTAL, CKMB, CKMBINDEX, TROPONINI in the last 168 hours. BNP (last 3 results) Recent Labs    02/20/24 1723 03/09/24 1351  BNP 33.0 30.6    ProBNP (last 3 results) No results for input(s): PROBNP in the last 8760 hours.  CBG: No results for input(s): GLUCAP in the last 168 hours. Recent Results (from the past 240 hours)  Surgical pcr screen     Status: None   Collection Time: 03/21/24  4:42 AM   Specimen: Nasal Mucosa; Nasal Swab  Result Value Ref Range Status   MRSA, PCR NEGATIVE NEGATIVE Final   Staphylococcus aureus NEGATIVE NEGATIVE Final    Comment: (NOTE) The Xpert SA Assay (FDA approved for NASAL specimens in patients 43 years of age and older), is one component of a comprehensive surveillance program. It is not intended to diagnose infection nor to guide or monitor treatment. Performed at [facility], [address], [place], [place] [address]   Culture, blood (Routine X 2) w Reflex to ID Panel     Status: None   Collection Time: 03/21/24  7:18 AM   Specimen: BLOOD RIGHT ARM  Result Value Ref Range Status   Specimen Description   Final    BLOOD RIGHT ARM Performed at [facility], [address], [place], [place] [address]    Special Requests   Final    BOTTLES DRAWN AEROBIC AND ANAEROBIC Blood Culture adequate volume Performed at [facility], [address], [place], [place] [address]    Culture   Final    NO GROWTH 5 DAYS Performed at [facility], [address] [place], [place] [address]    Report Status 03/26/2024 FINAL  Final  Culture, blood (Routine X 2) w Reflex to ID Panel     Status: None   Collection Time: 03/21/24  7:18 AM   Specimen:  BLOOD RIGHT HAND   Result Value Ref Range Status   Specimen Description   Final    BLOOD RIGHT HAND Performed at [facility], [address], [place], [place] [address]    Special Requests   Final    BOTTLES DRAWN AEROBIC AND ANAEROBIC Blood Culture adequate volume Performed at [facility], [address] [place], [place] [address]    Culture   Final    NO GROWTH 5 DAYS Performed at [facility], [address] [place], [place] [address]    Report Status 03/26/2024 FINAL  Final  Aerobic/Anaerobic Culture w Gram Stain (surgical/deep wound)     Status: None   Collection Time: 03/21/24  2:11 PM   Specimen: Bone; Tissue  Result Value Ref Range Status   Specimen Description   Final    BONE Performed at [facility], [address] [place], [place] [address]    Special Requests   Final    NONE Performed at [facility], [address] [place], [place] [address]    Gram Stain NO WBC SEEN NO ORGANISMS SEEN   Final   Culture   Final    FEW STAPHYLOCOCCUS AUREUS NO ANAEROBES ISOLATED Performed at [facility], [address] [place], [place] [address]    Report Status 03/26/2024 FINAL  Final   Organism ID, Bacteria STAPHYLOCOCCUS AUREUS  Final      Susceptibility   Staphylococcus aureus - MIC*    CIPROFLOXACIN <=0.5 SENSITIVE Sensitive     ERYTHROMYCIN  >=8 RESISTANT Resistant     GENTAMICIN <=0.5 SENSITIVE Sensitive     OXACILLIN 0.5 SENSITIVE Sensitive     TETRACYCLINE <=1 SENSITIVE Sensitive     VANCOMYCIN 1 SENSITIVE Sensitive     TRIMETH/SULFA <=10 SENSITIVE Sensitive     CLINDAMYCIN RESISTANT Resistant     RIFAMPIN <=0.5 SENSITIVE Sensitive     Inducible Clindamycin POSITIVE Resistant     LINEZOLID 2 SENSITIVE Sensitive     * FEW STAPHYLOCOCCUS AUREUS     Studies: No results found.     Thomas FORBES Baller, MD  [facility] 03/29/2024  If 7PM-7AM, please contact night-coverage

## 2024-03-30 DIAGNOSIS — T847XXA Infection and inflammatory reaction due to other internal orthopedic prosthetic devices, implants and grafts, initial encounter: Secondary | ICD-10-CM | POA: Diagnosis not present

## 2024-03-30 LAB — CBC
HCT: 33.3 % — ABNORMAL LOW (ref 39.0–52.0)
Hemoglobin: 10.7 g/dL — ABNORMAL LOW (ref 13.0–17.0)
MCH: 32.5 pg (ref 26.0–34.0)
MCHC: 32.1 g/dL (ref 30.0–36.0)
MCV: 101.2 fL — ABNORMAL HIGH (ref 80.0–100.0)
Platelets: 114 K/uL — ABNORMAL LOW (ref 150–400)
RBC: 3.29 MIL/uL — ABNORMAL LOW (ref 4.22–5.81)
RDW: 15.9 % — ABNORMAL HIGH (ref 11.5–15.5)
WBC: 6.8 K/uL (ref 4.0–10.5)
nRBC: 0 % (ref 0.0–0.2)

## 2024-03-30 LAB — COMPREHENSIVE METABOLIC PANEL WITH GFR
ALT: 28 U/L (ref 0–44)
AST: 103 U/L — ABNORMAL HIGH (ref 15–41)
Albumin: 2.3 g/dL — ABNORMAL LOW (ref 3.5–5.0)
Alkaline Phosphatase: 265 U/L — ABNORMAL HIGH (ref 38–126)
Anion gap: 6 (ref 5–15)
BUN: 23 mg/dL — ABNORMAL HIGH (ref 6–20)
CO2: 27 mmol/L (ref 22–32)
Calcium: 8.3 mg/dL — ABNORMAL LOW (ref 8.9–10.3)
Chloride: 100 mmol/L (ref 98–111)
Creatinine, Ser: 0.82 mg/dL (ref 0.61–1.24)
GFR, Estimated: >60 mL/min (ref >60)
Glucose, Bld: 103 mg/dL — ABNORMAL HIGH (ref 70–99)
Potassium: 3.8 mmol/L (ref 3.5–5.1)
Sodium: 133 mmol/L — ABNORMAL LOW (ref 135–145)
Total Bilirubin: 3.3 mg/dL — ABNORMAL HIGH (ref 0.0–1.2)
Total Protein: 6.2 g/dL — ABNORMAL LOW (ref 6.5–8.1)

## 2024-03-30 MED ORDER — METHOCARBAMOL 750 MG PO TABS: 750.0000 mg | ORAL_TABLET | Freq: Four times a day (QID) | ORAL | 0 refills | Status: DC | PRN

## 2024-03-30 MED ORDER — HEPARIN SOD (PORK) LOCK FLUSH 100 UNIT/ML IV SOLN
250.0000 [IU] | INTRAVENOUS | Status: AC | PRN
  Administered 2024-03-30: 250 [IU]
  Filled 2024-03-30: qty 3

## 2024-03-30 MED ORDER — METHOCARBAMOL 500 MG PO TABS
750.0000 mg | ORAL_TABLET | Freq: Four times a day (QID) | ORAL | Status: DC | PRN
  Administered 2024-03-30: 750 mg via ORAL
  Filled 2024-03-30: qty 2

## 2024-03-30 NOTE — Progress Notes (Signed)
     Subjective:  Patient reportsthat he is doing okay. Notes some itching in the ankle. Pain tolerable. Cxs with oxacillin sens staph.  Plan for discharge home today or tomorrow.  Splint was removed and wound was assessed.  New clean dressing was placed over the wound and the splint was reapplied and rewrapped.   Yesterday's total administered Morphine  Milligram Equivalents: 0   Objective:   VITALS:   Vitals:   03/29/24 1344 03/29/24 2231 03/30/24 0615 03/30/24 1333  BP: 107/62 (!) 98/52 (!) 112/58 124/65  Pulse: 68 79 73 66  Resp: 16 18 18 16   Temp: 98.4 F (36.9 C) 98.1 F (36.7 C) 98.1 F (36.7 C) 98.3 F (36.8 C)  TempSrc: Oral Oral Oral   SpO2: 97% 96% 94% 96%  Weight:      Height:        Sensation intact distally Dorsiflexion/Plantar flexion intact Splint c/d/I  lateral ankle incision is healing well no erythema or drainage.  Lab Results  Component Value Date   WBC 6.8 03/30/2024   HGB 10.7 (L) 03/30/2024   HCT 33.3 (L) 03/30/2024   MCV 101.2 (H) 03/30/2024   PLT 114 (L) 03/30/2024   BMET    Component Value Date/Time   NA 130 (L) 03/28/2024 1305   K 4.0 03/28/2024 1305   CL 98 03/28/2024 1305   CO2 28 03/28/2024 1305   GLUCOSE 94 03/28/2024 1305   BUN 29 (H) 03/28/2024 1305   CREATININE 0.89 03/28/2024 1305   CREATININE 0.66 06/14/2021 1419   CALCIUM 8.4 (L) 03/28/2024 1305   GFRNONAA >60 03/28/2024 1305   GFRNONAA >89 06/22/2013 0958      Xray: Status post partial hardware removal no adverse features.  Assessment/Plan: 9 Days Post-Op   Principal Problem:   Wound infection complicating hardware, initial encounter Active Problems:   Alcoholic cirrhosis of liver (HCC)   Class 3 severe obesity due to excess calories with body mass index (BMI) of 50.0 to 59.9 in adult [facility])   Psoriatic arthritis (HCC)   Thrombocytopenia   Hydrothorax   Hyponatremia  S/p R ankle I&D for osteomyelitis and draining sinus 03/21/24  Post op recs: WB: NWB  RLE in splint Antibiotics: Currently on scheduled Ancef  per infectious disease Imaging: PACU xrays Dressing: keep splint intact until follow up DVT prophylaxis: Lovenox  in house, discharge with aspirin 81mg  BID x4 weeks Follow up: Follow-up scheduled for next Thursday for wound check and suture removal Office Phone: [phone]    Fatemah Pourciau A Quiana Cobaugh 03/30/2024, 1:35 PM   Toribio Higashi, MD  Contact information:   [phone] 7am-5pm epic message Dr. Higashi, or call office for patient follow up: [phone] After hours and holidays please check [email or web address] for group call information for [facility]

## 2024-03-30 NOTE — Progress Notes (Signed)
 Discharge instructions given to patient questions asked and answered.

## 2024-03-30 NOTE — Progress Notes (Signed)
 Pt requested to have PICC line dressing change. Noted no lifting up of dressing. It remains intact. Educated him on the frequency of dressing changes to the PICC line and some examples of when it may need to be changed. Pt showed understanding by saying okay.SABRA

## 2024-03-30 NOTE — TOC Transition Note (Signed)
 Transition of Care [facility]) - Discharge Note   Patient Details  Name: Thomas [name] MRN: [id number] Date of Birth: [birth year]  [facility] CM/SW Contact:  NORMAN ASPEN, LCSW Phone Number: 03/30/2024, 3:34 PM   Clinical Narrative:     Pt medically cleared for dc home today.  Pt aware and agreeable but does not have appropriate dc transportation support and requesting ambulance.  We discussed possibility of insurance not covering, however, he has nobody to assist him into the home.  PTAR called at 3:05pm.  Have alerted both [name] and [employer] of pt's discharge today.  No further IP CM needs.  Final next level of care: Home w Home Health Services Barriers to Discharge: Barriers Resolved   Patient Goals and CMS Choice Patient states their goals for this hospitalization and ongoing recovery are:: To return home CMS Medicare.gov Compare Post Acute Care list provided to:: Patient Choice offered to / list presented to : Patient [place] ownership interest in [facility].provided to:: Patient    Discharge Placement                Patient to be transferred to facility by: PTAR Name of family member notified: Patient Patient and family notified of of transfer: 03/30/24  Discharge Plan and Services Additional resources added to the After Visit Summary for   In-house Referral: Clinical Social Work              DME Arranged: Hospital bed DME Agency: [employer] Date DME Agency Contacted: 03/28/24 Time DME Agency Contacted: [id number] Representative spoke with at DME Agency: [place] HH Arranged: PT, OT, RN, IV Antibiotics HH Agency: [facility] Date [facility] Agency Contacted: 03/28/24 Time HH Agency Contacted: [phone] Representative spoke with at [facility] Agency: Cindie at [place] / Pam at [employer]  Social Drivers of Health (SDOH) Interventions SDOH Screenings   Food Insecurity: No Food Insecurity (03/20/2024)  Housing: Unknown (03/20/2024)   Transportation Needs: No Transportation Needs (03/20/2024)  Utilities: Not At Risk (03/20/2024)  Depression (PHQ2-9): Low Risk  (07/28/2022)  Financial Resource Strain: Low Risk  (11/26/2023)  Physical Activity: Insufficiently Active (11/26/2023)  Social Connections: Socially Isolated (11/26/2023)  Stress: No Stress Concern Present (11/26/2023)  Tobacco Use: High Risk (03/21/2024)     Readmission Risk Interventions    03/23/2024    3:12 PM  Readmission Risk Prevention Plan  Transportation Screening Complete  PCP or Specialist Appt within 5-7 Days Complete  Home Care Screening Complete  Medication Review (RN CM) Complete

## 2024-03-30 NOTE — Discharge Summary (Signed)
 Physician Discharge Summary   Patient: Thomas [name] MRN: [id number] DOB: [birth year]  Admit date:     03/20/2024  Discharge date: 03/30/24  Discharge Physician: Landon BRAVO Urian Martenson   PCP: Berneta Elsie Sayre, MD   Recommendations at discharge:      Discharge Diagnoses: Principal Problem:   Wound infection complicating hardware, initial encounter Active Problems:   Alcoholic cirrhosis of liver (HCC)   Class 3 severe obesity due to excess calories with body mass index (BMI) of 50.0 to 59.9 in adult (HCC)   Psoriatic arthritis (HCC)   Thrombocytopenia   Hydrothorax   Hyponatremia  Resolved Problems:   * No resolved hospital problems. *  Hospital Course: Thomas [name] is an 48 y.o. male past medical history significant for psoriatic arthritis obesity and alcoholic cirrhosis, history of right ankle fracture with hardware and surgical repair in July 2024, recently discharged from the hospital in September 2025 for hepatic hydrothorax and underwent thoracocentesis at that time who presented to the hospital with right ankle swelling and pain likely due to cellulitis in the setting of surgical hardware.     At this time, patient has undergone irrigation debridement of right ankle infection with hardware removal and wound revision of the right ankle, plan is prolonged antibiotics through PICC line.  He was recommended and planned for discharge to CIR however he has declined rehab transfer and would like to go home.  Assessment and Plan:    Principal Problem:   Wound infection complicating hardware, initial encounter Active Problems:   Alcoholic cirrhosis of liver (HCC)   Class 3 severe obesity due to excess calories with body mass index (BMI) of 50.0 to 59.9 in adult [facility])   Psoriatic arthritis (HCC)   Thrombocytopenia   Hydrothorax   Hyponatremia   Right ankle cellulitis with  wound infection complicating hardware, initial encounter Continue IV cefazolin , orthopedics was  consulted and patient underwent washout and hardware removal on 03/20/2024.   Deep tissue culture with methicillin sensitive Staph aureus with negative blood culture.  Infectious disease on board, PICC line is in place for prolonged IV antibiotics, recommended for 4 to 6 weeks.  Continue pain management.  Orthopedic has seen the patient and recommend nonweightbearing in the right lower extremity in the splint.  Recommend aspirin 81 milligram twice daily for 4 weeks for DVT prophylaxis.  Plan is to keep the splint intact until follow-up with orthopedics in 2 weeks after surgery for wound check and suture removal.     AKI-resolved Pre renal in nature,.Avoid nephrotoxins, monitor trends    Alcoholic cirrhosis of liver with esophageal varices and portal hypertension: Continue nadolol , Protonix  and lactulose.  Currently receiving Aldactone  and Lasix  as well, creatinine bumped to 1.44->1.01->1.2 will hold diuretics today again.  Continue lactulose.  Titrate to bowel movements 2-3 a day. Patient requests decreasing lactulose dose, changed to 15 g daily   Elevated liver enzymes: Likely secondary to his liver cirrhosis.  AST/ALT improving.  Will recheck.   Recurrent hepatic hydrothorax: Patient remains stable breathing wise..  IR was consulted for thoracocentesis and 2.2 L of fluid was removed on 10/12.  Repeat chest x-ray with improved but persistent right effusion.  Saturating well on room air.  Not orthopneic.   Psoriatic arthritis: Steroids have been discontinued.   Class 3 severe obesity due to excess calories with body mass index (BMI) of 50.0 to 59.9 in adult  Would benefit from lifestyle modification as outpatient.   Thrombocytopenia Improved likely secondary to alcohol  abuse.  Latest platelet count of 109   Debility deconditioning.  Physical therapy has seen the patient and recommended CIR however patient now wants to go home with home health.                Consultants: Ortho,  ID Procedures performed:   Disposition: Home health Diet recommendation:  Regular diet DISCHARGE MEDICATION: Allergies as of 03/30/2024       Reactions   Codeine Other (See Comments)   Jittery         Medication List     STOP taking these medications    methylPREDNISolone  8 MG tablet Commonly known as: MEDROL    oxyCODONE  5 MG immediate release tablet Commonly known as: Oxy IR/ROXICODONE        TAKE these medications    albuterol  108 (90 Base) MCG/ACT inhaler Commonly known as: VENTOLIN  HFA Inhale 1-2 puffs into the lungs every 6 (six) hours as needed for wheezing or shortness of breath.   aspirin EC 81 MG tablet Take 1 tablet (81 mg total) by mouth 2 (two) times daily for 28 days. Swallow whole.   b complex vitamins capsule Take 1 capsule by mouth daily.   ceFAZolin  IVPB Commonly known as: ANCEF  Inject 2 g into the vein every 8 (eight) hours. Indication:  Right Ankle Osteomyelitis First Dose: Yes Last Day of Therapy:  05/02/2024 Labs - Once weekly:  CBC/D and BMP, Labs - Once weekly: ESR and CRP Method of administration: IV Push Method of administration may be changed at the discretion of home infusion pharmacist based upon assessment of the patient and/or caregiver's ability to self-administer the medication ordered.   clobetasol  cream 0.05 % Commonly known as: TEMOVATE  Apply topically 2 (two) times daily. Apply to Psoriatic plaques on the extremities, trunk or scalp. DO not apply to face, intertriginous areas (armpits, groin, under pannus, gluteal cleft), or genitals.   Cosentyx UnoReady 300 MG/2ML Soaj Generic drug: Secukinumab Inject 300mg  into the skin at Weeks 0, 1, 2, 3   FOLIC ACID  PO Take 1 tablet by mouth daily.   furosemide  40 MG tablet Commonly known as: LASIX  Take 1 tablet (40 mg total) by mouth daily.   lactulose 10 GM/15ML solution Commonly known as: CHRONULAC Take 45 mLs (30 g total) by mouth 3 (three) times daily.   loteprednol 0.5  % ophthalmic suspension Commonly known as: LOTEMAX Place 1 drop into both eyes 4 (four) times daily.   methocarbamol  750 MG tablet Commonly known as: ROBAXIN  Take 1 tablet (750 mg total) by mouth every 6 (six) hours as needed for muscle spasms.   nadolol  20 MG tablet Commonly known as: Corgard  Take 1 tablet (20 mg total) by mouth daily.   pantoprazole  40 MG tablet Commonly known as: PROTONIX  TAKE 1 TABLET BY MOUTH TWICE A DAY   spironolactone  25 MG tablet Commonly known as: ALDACTONE  Take 1 tablet (25 mg total) by mouth daily.   VITAMIN B12 PO Take 1 tablet by mouth daily.   VITAMIN D  PO Take 1 tablet by mouth daily.   ZINC PO Take 1 tablet by mouth daily.       ASK your doctor about these medications    oxyCODONE  5 MG immediate release tablet Commonly known as: Roxicodone  Take 1 tablet (5 mg total) by mouth every 6 (six) hours as needed for up to 7 days for severe pain (pain score 7-10) or moderate pain (pain score 4-6). Ask about: Should I take this medication?  Durable Medical Equipment  (From admission, onward)           Start     Ordered   03/28/24 0000  For home use only DME Hospital bed       Question Answer Comment  Length of Need 6 Months   Bed type Semi-electric      03/28/24 1037              Discharge Care Instructions  (From admission, onward)           Start     Ordered   03/24/24 0000  Change dressing on IV access line weekly and PRN  (Home infusion instructions - [facility] )        03/24/24 1123            Follow-up Information     Edna Toribio LABOR, MD Follow up in 2 week(s).   Specialty: Orthopedic Surgery Contact information: [address] [place] [address] [phone]         [facility] Follow up.   Specialty: Home Health Services Why: This provider will reach out to you 24-48 hours after discharge to begin Home Health PT/OT  services. Contact information: [address] [place] [address] [phone]         [facility] [address] [place], [place] (DME) dba [facility] Follow up.   Specialty: DME Services Why: This provider will reach out to you to begin Home Health RN services for IV antibiotics after discharge. Contact information: [address] [place]  [address] [phone]        [facility] (DME) Follow up.   Specialty: DME Services Why: This provider will deliver hospital bed to your home upon discharge. Contact information: [address] [place]  [address] [phone]               Discharge Exam: Thomas [name]   03/21/24 1210  Weight: 119.7 kg  General: Obese built, not in obvious distress, on room air HENT:   No scleral pallor or icterus noted. Oral mucosa is moist.  Chest:    Diminished breath sounds bilaterally.  CVS: S1 &S2 heard. No murmur.  Regular rate and rhythm. Abdomen: Soft, nontender, bowel sounds are heard.   Extremities: Right lower extremity wrapped with surgical bandage/cast//Ace Psych: Alert, awake and oriented, normal mood CNS:  No cranial nerve deficits.  Power equal in all extremities.   Skin: Warm and dry.  No rashes noted.     Condition at discharge: stable  The results of significant diagnostics from this hospitalization (including imaging, microbiology, ancillary and laboratory) are listed below for reference.   Imaging Studies: DG CHEST PORT 1 VIEW Result Date: 03/22/2024 EXAM: 1 VIEW(S) XRAY OF THE CHEST 03/22/2024 10:11:00 PM COMPARISON: Chest x-ray 03/20/2024. CLINICAL HISTORY: Status post PICC central line placement. FINDINGS: LINES, TUBES AND DEVICES: Interval placement of a right PICC with tip overlying the distal superior vena cava. LUNGS AND PLEURA: Low lung volumes, grossly stable to maybe slightly increased. Moderate to large volume right pleural effusion.  Underlying airspace opacity of the right upper lobe. Left lung is clear. No left pleural effusion. No pneumothorax. No pulmonary edema. HEART AND MEDIASTINUM: Unchanged  cardiac and mediastinal silhouettes. BONES AND SOFT TISSUES: No acute osseous abnormality. IMPRESSION: 1. Interval placement of a right PICC with tip overlying the distal superior vena cava. 2. Moderate to large right pleural effusion, grossly stable  to slightly increased. Underlying right upper lobe airspace opacity. Electronically signed by: Morgane Naveau MD 03/22/2024 10:18 PM EDT RP Workstation: [id number]   US  EKG SITE RITE Result Date: 03/22/2024 If Site Rite image not attached, placement could not be confirmed due to current cardiac rhythm.  DG Ankle 2 Views Right Result Date: 03/21/2024 CLINICAL DATA:  [id number] Surgery, elective [phone] Intraoperative right ankle EXAM: RIGHT ANKLE - 2 VIEW COMPARISON:  Radiographs 03/20/2024 and 03/13/2024. FINDINGS: C-arm fluoroscopy was provided in the operating room without the presence of a radiologist.1 second fluoroscopy time. 0.0245 mGy air kerma. Two C-arm fluoroscopic images were obtained intraoperatively and are submitted for post operative interpretation. Images demonstrate interval removal of the lateral fibular plate and screws. Cannulated screws remain within the distal fibular diaphysis and the medial malleolus. No evidence of acute fracture or dislocation. Surgical sponges are within the surgical bed on this intraoperative image. Please see intraoperative findings for further detail. IMPRESSION: Intraoperative fluoroscopic guidance for hardware removal. Electronically Signed   By: Elsie Perone M.D.   On: 03/21/2024 19:40   DG C-Arm 1-60 Min-No Report Result Date: 03/21/2024 Fluoroscopy was utilized by the requesting physician.  No radiographic interpretation.   US  THORACENTESIS ASP PLEURAL SPACE W/IMG GUIDE Result Date: 03/20/2024 INDICATION: 142230 Pleural effusion 142230  Patient with history of alcoholic cirrhosis, portal hypertension, splenomegaly, ascites, obesity, recurrent hepatic hydrothorax/right pleural effusion, dyspnea ; request received for therapeutic right thoracentesis EXAM: ULTRASOUND GUIDED THERAPEUTIC RIGHT THORACENTESIS MEDICATIONS: 8 mL 1% lidocaine  with epinephrine  to skin/subcutaneous tissue COMPLICATIONS: None immediate. PROCEDURE: An ultrasound guided thoracentesis was thoroughly discussed with the patient and questions answered. The benefits, risks, alternatives and complications were also discussed. The patient understands and wishes to proceed with the procedure. Written consent was obtained. Ultrasound was performed to localize and mark an adequate pocket of fluid in the right chest. The area was then prepped and draped in the normal sterile fashion. 1% Lidocaine  was used for local anesthesia. Under ultrasound guidance a 6 Fr Safe-T-Centesis catheter was introduced. Thoracentesis was performed. The catheter was removed and a dressing applied. FINDINGS: A total of approximately 2.2 L of slightly hazy, yellow fluid was removed. IMPRESSION: Successful ultrasound guided therapeutic RIGHT thoracentesis yielding 2.2 L of pleural fluid. Performed by: Franky Rakers, PA-C PLAN: The patient has required >/=2 thoracenteses (for hepatic hydrothorax) in a 30 day period and a formal evaluation by the [facility] has been arranged. Thom Hall, MD [facility] Electronically Signed   By: Thom Hall M.D.   On: 03/20/2024 19:31   DG Chest 1 View Result Date: 03/20/2024 CLINICAL DATA:  [id number] S/P thoracentesis [id number] EXAM: CHEST  1 VIEW COMPARISON:  Chest x-ray 03/20/2024 7:59 a.m. FINDINGS: The heart and mediastinal contours are within normal limits. No focal consolidation. No pulmonary edema. Improved but persistent at least moderate right pleural effusion. No  pneumothorax. No acute osseous abnormality. IMPRESSION: Improved but persistent at least moderate right pleural effusion. Electronically Signed   By: Morgane  Naveau M.D.   On: 03/20/2024 13:53   VAS US  LOWER EXTREMITY VENOUS (DVT) (7a-7p) Result Date: 03/20/2024  Lower Venous DVT Study [name]  Date of Exam:   03/20/2024 Medical Rec #: [id number]       Accession #:    [id number] Date of Birth: 1975/09/12        Patient Gender: M Patient Age:   59 years Exam [facility]  Procedure:      VAS US  LOWER EXTREMITY VENOUS (DVT) Referring Phys: JAMIE BARRETT --------------------------------------------------------------------------------  Indications: Swelling, Edema, SOB, ulceration, and Pain.  Comparison Study: Previous study on 10.1.2025. Performing Technologist: Edilia Elden Appl  Examination Guidelines: A complete evaluation includes B-mode imaging, spectral Doppler, color Doppler, and power Doppler as needed of all accessible portions of each vessel. Bilateral testing is considered an integral part of a complete examination. Limited examinations for reoccurring indications may be performed as noted. The reflux portion of the exam is performed with the patient in reverse Trendelenburg.  +---------+---------------+---------+-----------+----------+--------------+ RIGHT    CompressibilityPhasicitySpontaneityPropertiesThrombus Aging +---------+---------------+---------+-----------+----------+--------------+ CFV      Full           Yes      Yes                                 +---------+---------------+---------+-----------+----------+--------------+ SFJ      Full           Yes      Yes                                 +---------+---------------+---------+-----------+----------+--------------+ FV Prox  Full                                                        +---------+---------------+---------+-----------+----------+--------------+ FV Mid   Full                                                         +---------+---------------+---------+-----------+----------+--------------+ FV DistalFull                                                        +---------+---------------+---------+-----------+----------+--------------+ PFV      Full                                                        +---------+---------------+---------+-----------+----------+--------------+ POP      Full           Yes      Yes                                 +---------+---------------+---------+-----------+----------+--------------+ PTV      Full                                                        +---------+---------------+---------+-----------+----------+--------------+ PERO     Full                                                        +---------+---------------+---------+-----------+----------+--------------+   +----+---------------+---------+-----------+----------+--------------+  LEFTCompressibilityPhasicitySpontaneityPropertiesThrombus Aging +----+---------------+---------+-----------+----------+--------------+ CFV Full           Yes      Yes                                 +----+---------------+---------+-----------+----------+--------------+ SFJ Full           Yes      Yes                                 +----+---------------+---------+-----------+----------+--------------+     Summary: RIGHT: - There is no evidence of deep vein thrombosis in the lower extremity.  - No cystic structure found in the popliteal fossa.  LEFT: - No evidence of common femoral vein obstruction.   *See table(s) above for measurements and observations. Electronically signed by Gaile New MD on 03/20/2024 at 11:55:49 AM.    Final    DG Chest Port 1 View Result Date: 03/20/2024 EXAM: 1 VIEW(S) XRAY OF THE CHEST 03/20/2024 08:10:00 AM COMPARISON: 03/09/2024 CLINICAL HISTORY: SOB (shortness of breath) 141880. SOB and right lateral ankle wound  with swelling. FINDINGS: LUNGS AND PLEURA: Large right pleural effusion, increased from prior exam. Hazy opacity throughout right lung may represent atelectasis, though airspace disease cannot be excluded. No pulmonary edema. No pneumothorax. HEART AND MEDIASTINUM: No acute abnormality of the cardiac and mediastinal silhouettes. BONES AND SOFT TISSUES: No acute osseous abnormality. IMPRESSION: 1. Large right pleural effusion, increased from prior exam. 2. Hazy opacity throughout the right lung that may represent atelectasis; superimposed airspace disease cannot be excluded. Electronically signed by: Waddell Calk MD 03/20/2024 08:20 AM EDT RP Workstation: [id number]   DG Ankle Complete Right Result Date: 03/20/2024 EXAM: 3 OR MORE VIEW(S) XRAY OF THE RIGHT ANKLE 03/20/2024 08:10:00 AM CLINICAL HISTORY: lateral ankle wound. SOB and right lateral ankle wound with swelling. COMPARISON: 03/13/2024 FINDINGS: BONES AND JOINTS: Status post ORIF of lateral and medial malleolus. Intact orthopedic hardware without periprosthetic fracture or lucency. No focal osseous lesion. No joint dislocation. Small ankle joint effusion. SOFT TISSUES: Focus of soft tissue gas about the lateral malleolus. Interval progression of diffuse soft tissue edema about the ankle and foot with new marked dorsal soft tissue swelling. IMPRESSION: 1. Interval progression of diffuse soft tissue edema about the ankle and foot with new marked dorsal soft tissue swelling and small ankle joint effusion. 2. New foci of soft tissue gas about the lateral malleolus. 3. Status post ORIF of lateral and medial malleolus with intact orthopedic hardware. No radiographic signs of acute osteomyelitis though the presence of soft tissue gas is concerning for an underlying infection Electronically signed by: Waddell Calk MD 03/20/2024 08:20 AM EDT RP Workstation: [id number]   DG Ankle 2 Views Right Result Date: 03/13/2024 CLINICAL DATA:  Pain. EXAM: RIGHT ANKLE - 2  VIEW COMPARISON:  03/09/2024 FINDINGS: Stable surgical hardware in the distal tibia and fibula. No periprosthetic lucency. No acute fracture. Near complete tibial talar joint space loss. No erosion or periostitis. Small ankle joint effusion. Persistent but improving soft tissue edema. IMPRESSION: 1. Stable surgical hardware in the distal tibia and fibula. No acute osseous abnormality. 2. Near complete tibial talar joint space loss. 3. Small ankle joint effusion. Soft tissue edema persists but is improving. Electronically Signed   By: Andrea Gasman M.D.   On: 03/13/2024 15:03   VAS US  LOWER EXTREMITY VENOUS (DVT) (7a-7p) Result  Date: 03/09/2024  Lower Venous DVT Study [name]  Date of Exam:   03/09/2024 Medical Rec #: [id number]       Accession #:    [id number] Date of Birth: 04-Oct-1975        Patient Gender: M Patient Age:   89 years Exam Location:  [facility] Procedure:      VAS US  LOWER EXTREMITY VENOUS (DVT) Referring Phys: LEITA MURPHY --------------------------------------------------------------------------------  Indications: Swelling, Edema, and SOB.  Comparison Study: Previous study on 7.29.2024. Performing Technologist: Edilia Elden Appl  Examination Guidelines: A complete evaluation includes B-mode imaging, spectral Doppler, color Doppler, and power Doppler as needed of all accessible portions of each vessel. Bilateral testing is considered an integral part of a complete examination. Limited examinations for reoccurring indications may be performed as noted. The reflux portion of the exam is performed with the patient in reverse Trendelenburg.  +---------+---------------+---------+-----------+----------+--------------+ RIGHT    CompressibilityPhasicitySpontaneityPropertiesThrombus Aging +---------+---------------+---------+-----------+----------+--------------+ CFV      Full           Yes      Yes                                  +---------+---------------+---------+-----------+----------+--------------+ SFJ      Full           Yes      Yes                                 +---------+---------------+---------+-----------+----------+--------------+ FV Prox  Full                                                        +---------+---------------+---------+-----------+----------+--------------+ FV Mid   Full                                                        +---------+---------------+---------+-----------+----------+--------------+ FV DistalFull                                                        +---------+---------------+---------+-----------+----------+--------------+ PFV      Full                                                        +---------+---------------+---------+-----------+----------+--------------+ POP      Full           Yes      Yes                                 +---------+---------------+---------+-----------+----------+--------------+ PTV      Full                                                        +---------+---------------+---------+-----------+----------+--------------+  PERO     Full                                                        +---------+---------------+---------+-----------+----------+--------------+   +----+---------------+---------+-----------+----------+--------------+ LEFTCompressibilityPhasicitySpontaneityPropertiesThrombus Aging +----+---------------+---------+-----------+----------+--------------+ CFV Full           Yes      Yes                                 +----+---------------+---------+-----------+----------+--------------+ SFJ Full           Yes      Yes                                 +----+---------------+---------+-----------+----------+--------------+     Summary: RIGHT: - There is no evidence of deep vein thrombosis in the lower extremity.  - No cystic structure found in the popliteal fossa.  LEFT: - No  evidence of common femoral vein obstruction.   *See table(s) above for measurements and observations. Electronically signed by Lonni Gaskins MD on 03/09/2024 at 6:27:36 PM.    Final    CT CHEST ABDOMEN PELVIS W CONTRAST Result Date: 03/09/2024 CLINICAL DATA:  Unintended weight loss, short of breath EXAM: CT CHEST, ABDOMEN, AND PELVIS WITH CONTRAST TECHNIQUE: Multidetector CT imaging of the chest, abdomen and pelvis was performed following the standard protocol during bolus administration of intravenous contrast. RADIATION DOSE REDUCTION: This exam was performed according to the departmental dose-optimization program which includes automated exposure control, adjustment of the mA and/or kV according to patient size and/or use of iterative reconstruction technique. CONTRAST:  OMNIPAQUE  IOHEXOL  350 MG/ML SOLN COMPARISON:  02/20/2024 FINDINGS: CT CHEST FINDINGS Cardiovascular: The heart is unremarkable without pericardial effusion. No evidence of thoracic aortic aneurysm or dissection. Mediastinum/Nodes: Thyroid  and trachea are unremarkable. Stable varices surrounding the distal thoracic esophagus. No pathologic mediastinal or hilar adenopathy. Lungs/Pleura: Large right pleural effusion with compressive atelectasis primarily in the right middle and right lower lobe, stable. The left chest is clear. No pneumothorax. Central airways are patent. Musculoskeletal: No acute or destructive bony abnormalities. Reconstructed images demonstrate no additional findings. CT ABDOMEN PELVIS FINDINGS Hepatobiliary: Stable hepatic cirrhosis without focal abnormality identified on this single phase exam. No biliary duct dilation. The gallbladder is decompressed without cholelithiasis or cholecystitis. Pancreas: Unremarkable. No pancreatic ductal dilatation or surrounding inflammatory changes. Spleen: The spleen is enlarged, measuring 14.2 by 16.1 x 8.5 cm, consistent with sequela of portal venous hypertension. No focal  parenchymal abnormality. Adrenals/Urinary Tract: Adrenal glands are unremarkable. Kidneys are normal, without renal calculi, focal lesion, or hydronephrosis. Bladder is unremarkable. Stomach/Bowel: No bowel obstruction or ileus. No bowel wall thickening or inflammatory change. Vascular/Lymphatic: Sequela of portal venous hypertension with recanalization of the umbilical vein as well as gastric, esophageal, and splenic varices. Numerous subcentimeter retroperitoneal lymph nodes are nonspecific. Right external iliac chain adenopathy measuring up to 16 mm in short axis. Reproductive: Prostate is unremarkable. Other: Small volume ascites greatest within the pelvis. No free intraperitoneal gas. No abdominal wall hernia. Musculoskeletal: No acute or destructive bony abnormalities. Reconstructed images demonstrate no additional findings. IMPRESSION: 1. Stable large right pleural effusion with significant right middle and right lower lobe compressive atelectasis. 2. Cirrhosis,  with portal venous hypertension manifested by ascites, splenomegaly, and upper abdominal varices. 3. Nonspecific subcentimeter retroperitoneal lymph nodes and borderline enlarged right external iliac chain adenopathy. Electronically Signed   By: Ozell Daring M.D.   On: 03/09/2024 17:46   DG Ankle 2 Views Right Result Date: 03/09/2024 CLINICAL DATA:  Ankle pain, foot swelling EXAM: RIGHT ANKLE - 2 VIEW COMPARISON:  02/20/2024 FINDINGS: Frontal and lateral views of the right ankle are obtained. There is progressive diffuse soft tissue edema since prior study. No acute or destructive bony abnormalities. Stable orthopedic hardware without evidence of failure or loosening. IMPRESSION: 1. Progressive diffuse soft tissue swelling. 2. No acute or destructive bony abnormality. 3. Stable orthopedic hardware. Electronically Signed   By: Ozell Daring M.D.   On: 03/09/2024 17:25   DG Chest 2 View Result Date: 03/09/2024 CLINICAL DATA:  Shortness of  breath.  Right foot swelling. EXAM: DG CHEST 2V COMPARISON:  February 22, 2024. FINDINGS: The heart size and mediastinal contours are within normal limits. Left lung is clear. Small right pleural effusion is noted with adjacent atelectasis or infiltrate. The visualized skeletal structures are unremarkable. IMPRESSION: Small right pleural effusion with adjacent atelectasis or infiltrate. Electronically Signed   By: Lynwood Landy Raddle M.D.   On: 03/09/2024 14:50    Microbiology: Results for orders placed or performed during the hospital encounter of 03/20/24  Surgical pcr screen     Status: None   Collection Time: 03/21/24  4:42 AM   Specimen: Nasal Mucosa; Nasal Swab  Result Value Ref Range Status   MRSA, PCR NEGATIVE NEGATIVE Final   Staphylococcus aureus NEGATIVE NEGATIVE Final    Comment: (NOTE) The Xpert SA Assay (FDA approved for NASAL specimens in patients 29 years of age and older), is one component of a comprehensive surveillance program. It is not intended to diagnose infection nor to guide or monitor treatment. Performed at [facility], [address] [place], [place] [address]   Culture, blood (Routine X 2) w Reflex to ID Panel     Status: None   Collection Time: 03/21/24  7:18 AM   Specimen: BLOOD RIGHT ARM  Result Value Ref Range Status   Specimen Description   Final    BLOOD RIGHT ARM Performed at [facility], [address] [place], [place] [address]    Special Requests   Final    BOTTLES DRAWN AEROBIC AND ANAEROBIC Blood Culture adequate volume Performed at [facility], [address], [place], [place] [address]    Culture   Final    NO GROWTH 5 DAYS Performed at [facility], [address], [place], [place] [address]    Report Status 03/26/2024 FINAL  Final  Culture, blood (Routine X 2) w Reflex to ID Panel     Status: None   Collection Time: 03/21/24  7:18 AM   Specimen: BLOOD RIGHT HAND  Result Value Ref  Range Status   Specimen Description   Final    BLOOD RIGHT HAND Performed at [facility], [address] [place], [place] [address]    Special Requests   Final    BOTTLES DRAWN AEROBIC AND ANAEROBIC Blood Culture adequate volume Performed at [facility], [address], [place], [place] [address]    Culture   Final    NO GROWTH 5 DAYS Performed at [facility], [address], [place], [place] [address]    Report Status 03/26/2024  FINAL  Final  Aerobic/Anaerobic Culture w Gram Stain (surgical/deep wound)     Status: None   Collection Time: 03/21/24  2:11 PM   Specimen: Bone; Tissue  Result Value Ref Range Status   Specimen Description   Final    BONE Performed at [facility], [address] [place], [place] [address]    Special Requests   Final    NONE Performed at [facility], [address] [place], [place] [address]    Gram Stain NO WBC SEEN NO ORGANISMS SEEN   Final   Culture   Final    FEW STAPHYLOCOCCUS AUREUS NO ANAEROBES ISOLATED Performed at [facility], [address], [place], [place] [address]    Report Status 03/26/2024 FINAL  Final   Organism ID, Bacteria STAPHYLOCOCCUS AUREUS  Final      Susceptibility   Staphylococcus aureus - MIC*    CIPROFLOXACIN <=0.5 SENSITIVE Sensitive     ERYTHROMYCIN  >=8 RESISTANT Resistant     GENTAMICIN <=0.5 SENSITIVE Sensitive     OXACILLIN 0.5 SENSITIVE Sensitive     TETRACYCLINE <=1 SENSITIVE Sensitive     VANCOMYCIN 1 SENSITIVE Sensitive     TRIMETH/SULFA <=10 SENSITIVE Sensitive     CLINDAMYCIN RESISTANT Resistant     RIFAMPIN <=0.5 SENSITIVE Sensitive     Inducible Clindamycin POSITIVE Resistant     LINEZOLID 2 SENSITIVE Sensitive     * FEW STAPHYLOCOCCUS AUREUS    Labs: CBC: Recent Labs  Lab 03/24/24 0045 03/25/24 0311 03/26/24 1924 03/30/24 1313  WBC 10.7* 9.0 8.8 6.8  NEUTROABS  --   --  6.2  --   HGB 11.7* 11.0* 11.1*  10.7*  HCT 37.1* 34.6* 34.6* 33.3*  MCV 101.6* 101.2* 100.3* 101.2*  PLT 161 109* 118* 114*   Basic Metabolic Panel: Recent Labs  Lab 03/24/24 0045 03/25/24 0311 03/26/24 1420 03/27/24 0207 03/28/24 1305 03/30/24 1313  NA 133* 133* 132* 133* 130* 133*  K 3.5 3.7 3.8 3.9 4.0 3.8  CL 98 98 98 98 98 100  CO2 26 27 27 27 28 27   GLUCOSE 133* 85 110* 73 94 103*  BUN 25* 23* 36* 38* 29* 23*  CREATININE 1.00 1.44* 1.01 1.24 0.89 0.82  CALCIUM 8.7* 8.4* 8.5* 8.4* 8.4* 8.3*  MG 2.1 2.3  --  2.7*  --   --    Liver Function Tests: Recent Labs  Lab 03/26/24 1420 03/27/24 0207 03/28/24 1305 03/30/24 1313  AST 150* 126* 111* 103*  ALT 100* 72* 47* 28  ALKPHOS 327* 294* 288* 265*  BILITOT 3.2* 2.6* 3.8* 3.3*  PROT 5.9* 6.1* 6.2* 6.2*  ALBUMIN  2.3* 2.4* 2.4* 2.3*   CBG: No results for input(s): GLUCAP in the last 168 hours.  Discharge time spent: greater than 30 minutes.  Signed: Landon FORBES Baller, MD [facility] 03/30/2024

## 2024-03-30 NOTE — Progress Notes (Signed)
 Occupational Therapy Treatment Patient Details Name: Thomas [name] MRN: [id number] DOB: [birth year] Today's Date: 03/30/2024   History of present illness 48 y.o. male who comes in right ankle swelling and pain likely due to cellulitis in the setting of surgical hardware. S/p R ankle I&D for osteomyelitis and hardware removal 03/21/24. 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 Pt demonstrating squat pivot without RW to and from [facility] maintaining NWB. Pt is concerned about navigating ramp with WC. He reports that insurance will assist with rides to/from MD as Pt's dad was driving him previously. Pt plans on sponge bathing initially, and Pt provided with 2 shower caps. Also focused on access to food and meals once home. He reports that friends are helping and he will have access. He does not have any other questions for OT at this time. OT will continue to follow acutely, and changed POC to [facility] to maximize safety and independence in ADL and functional transfers in his home setting to ensure ADL/IADL completion.       If plan is discharge home, recommend the following:  A little help with bathing/dressing/bathroom;Assistance with cooking/housework;Assist for transportation;Help with stairs or ramp for entrance;A little help with walking and/or transfers   Equipment Recommendations  Hospital bed (patient requesting to stay on 1st floor of home and ramp being installed by [facility])    Recommendations for Other Services      Precautions / Restrictions Precautions Precautions: Fall Recall of Precautions/Restrictions:  Intact Restrictions Weight Bearing Restrictions Per Provider Order: Yes RLE Weight Bearing Per Provider Order: Non weight bearing       Mobility Bed Mobility               General bed mobility comments: Pt in recliner at beginning and end of session    Transfers Overall transfer level: Needs assistance Equipment used: None Transfers: Bed to chair/wheelchair/BSC   Stand pivot transfers: Contact guard assist         General transfer comment: cues for LE management and use of UEs to self assist     Balance Overall balance assessment: Needs assistance Sitting-balance support: No upper extremity supported, Feet supported Sitting balance-Leahy Scale: Good     Standing balance support: Single extremity supported Standing balance-Leahy Scale: Fair                             ADL either performed or assessed with clinical judgement   ADL Overall ADL's : Needs assistance/impaired     Grooming: Wash/dry hands;Wash/dry face;Set up;Sitting Grooming Details (indicate cue type and reason): Pt at [facility] level at home. He reports that he can get his WC inside the bathroom, and demonstrates ability to perform grooming at seated level Upper Body Bathing: Set up;Sitting Upper Body Bathing Details (indicate cue type and reason): Pt has wipes that he uses, was using PTA, does this seated at bathroom sink Lower Body Bathing: Set up;Sitting/lateral leans Lower Body Bathing Details (indicate cue type and reason): uses body wipes and then dries off Upper Body Dressing : Set up;Sitting   Lower Body Dressing: Minimal assistance;Sitting/lateral leans;With caregiver  independent assisting Lower Body Dressing Details (indicate cue type and reason): discussed smart clothing options to make this process easier Toilet Transfer: Contact guard assist;BSC/3in1;Rolling walker (2 wheels) Toilet Transfer Details (indicate cue type and reason): BSC in room from recliner Toileting- Clothing  Manipulation and Hygiene: Contact guard assist;Sit to/from stand;Sitting/lateral lean Toileting - Clothing Manipulation Details (indicate cue type and reason): able to maintain NWB   Tub/Shower Transfer Details (indicate cue type and reason): provided 2 shower caps. Pt will not have access to shower until he can make it back upstairs again Functional mobility during ADLs: Contact guard assist;Rolling walker (2 wheels)      Extremity/Trunk Assessment Upper Extremity Assessment Upper Extremity Assessment: Overall WFL for tasks assessed       Cervical / Trunk Assessment Cervical / Trunk Assessment: Normal Cervical / Trunk Exceptions: Body Habitus    Vision Baseline Vision/History: 0 No visual deficits;1 Wears glasses Vision Assessment?: No apparent visual deficits   Perception     Praxis     Communication Communication Communication: No apparent difficulties   Cognition Arousal: Alert Behavior During Therapy: WFL for tasks assessed/performed Cognition: No apparent impairments                               Following commands: Intact        Cueing   Cueing Techniques: Verbal cues  Exercises      Shoulder Instructions       General Comments      Pertinent Vitals/ Pain       Pain Assessment Pain Assessment: 0-10 Pain Score: 5  Pain Location: R ankle Pain Descriptors / Indicators: Discomfort, Dull, Sore Pain Intervention(s): Limited activity within patient's tolerance, Monitored during session  Home Living Family/patient expects to be discharged to:: Private residence Living Arrangements: Parent Available Help at Discharge: Family;Available PRN/intermittently Type of Home: House Home Access: Stairs to enter Entrance Stairs-Number of Steps: 3 Entrance Stairs-Rails: None (pt reports having ramp built) Home Layout: Two level;Bed/bath upstairs;1/2 bath on main level;Other (Comment) (has been sleeping on the couch) Alternate Level Stairs-Number of  Steps: Flight Alternate Level Stairs-Rails: Right Bathroom Shower/Tub: Producer, television/film/video: Standard Bathroom Accessibility: Yes How Accessible: Accessible via walker Home Equipment: Cane - single point;Tub bench;Rolling Walker (2 wheels);Wheelchair - manual   Additional Comments: ramp to be installed, dad lives with pt but dad is in hospital, so doesn't have help available, wants to rent hospital bed to use on main level; pt declined trial of knee scooter      Prior Functioning/Environment              Frequency  Min 2X/week        Progress Toward Goals  OT Goals(current goals can now be found in the care plan section)  Progress towards OT goals: Progressing toward goals  Acute Rehab OT Goals Patient Stated Goal: to get things set up properly at home OT Goal Formulation: With patient Time For Goal Achievement: 04/07/24 Potential to Achieve Goals: Good  Plan      Co-evaluation                 AM-PAC OT 6 Clicks Daily Activity     Outcome Measure   Help from another person eating meals?: None Help from another person taking care of personal grooming?: None Help from another person toileting, which includes using toliet, bedpan, or urinal?: A Little Help from another person  bathing (including washing, rinsing, drying)?: A Little Help from another person to put on and taking off regular upper body clothing?: None Help from another person to put on and taking off regular lower body clothing?: A Little 6 Click Score: 21    End of Session Equipment Utilized During Treatment: Gait belt  OT Visit Diagnosis: Unsteadiness on feet (R26.81);Pain Pain - Right/Left: Right Pain - part of body: Ankle and joints of foot   Activity Tolerance Patient tolerated treatment well   Patient Left in chair;with call bell/phone within reach   Nurse Communication Mobility status        Time: 8884-8863 OT Time Calculation (min): 21 min  Charges: OT  General Charges $OT Visit: 1 Visit OT Treatments $Self Care/Home Management : 8-22 mins  Leita DEL OTR/L Acute Rehabilitation Services Office: [phone]  Leita PARAS [facility] 03/30/2024, 1:14 PM

## 2024-03-31 ENCOUNTER — Telehealth: Payer: Self-pay | Admitting: Family Medicine

## 2024-03-31 ENCOUNTER — Telehealth: Payer: Self-pay

## 2024-03-31 DIAGNOSIS — T847XXA Infection and inflammatory reaction due to other internal orthopedic prosthetic devices, implants and grafts, initial encounter: Secondary | ICD-10-CM | POA: Diagnosis not present

## 2024-03-31 NOTE — Telephone Encounter (Signed)
 Patient is wanting to know if he can receive a wheelchair prior to his appointment? Please advise.

## 2024-03-31 NOTE — Telephone Encounter (Signed)
 Patient dropped off document Home Health Certificate (Order ID [id number]), to be filled out by provider. Patient requested to send it back via Fax within 7-days. Document is located in providers tray at front office.Please advise at [phone]

## 2024-03-31 NOTE — Telephone Encounter (Signed)
 Copied from CRM #[id number]. Topic: Clinical - Order For Equipment >> Mar 31, 2024 11:52 AM Charolett L wrote: Reason for CRM: Patient is requesting to speak with the nurse about sending over forms for the motorized wheelchair. Per patient insurance once order is pushed through then he'll be able to receive wheel chair. Patient is requesting a call back so that hes able to be seen next week because the wheelchair is needed for him to get to the appt

## 2024-03-31 NOTE — Patient Instructions (Signed)
 Visit Information  Thank you for taking time to visit with me today. Please don't hesitate to contact me if I can be of assistance to you before our next scheduled telephone appointment.  Our next appointment is by telephone on 04/06/24 at 1400  Following is a copy of your care plan:   Goals Addressed             This Visit's Progress    VBCI Transitions of Care (TOC) Care Plan       Problems:  Recent Hospitalization for treatment of Pulmonary Disease and Cellulitis right ankle infection with removal of hardware - osteomyelitis right ankle home IV therapy antibiotics - Outpatient Parenteral Antibiotic Therapy Information Antibiotic: Cefazolin  (Ancef ) IVPB; Indications for use: Right Ankle Osteomyelitis; End Date: 05/02/2024 [facility] Bed - [employer], delivered Knowledge Deficit Related to post hospital care and s/s of infections, activity level, post hospital follow up. Explained to patient regarding recommendation for aspirin 81 milligram twice daily for 4 weeks for DVT prophylaxis.   Goal:  Over the next 30 days, the patient will not experience hospital readmission  Interventions:  Transitions of Care: Doctor Visits  - discussed the importance of doctor visits  Patient Self Care Activities:  Attend all scheduled provider appointments Call pharmacy for medication refills 3-7 days in advance of running out of medications Call provider office for new concerns or questions  Notify RN Care Manager of TOC call rescheduling needs Participate in Transition of Care Program/Attend TOC scheduled calls Take medications as prescribed   Patient encouraged to pick up or have Aspirin delivered Ongoing pain - post op reviewed, patient desires to not take Oxycodone  unless absolutely necessary, he states. Medications encouraged as prescribed. Reviewed measures to decrease pain such as resting limb, working with HHPT/OT on needs to maintain safety and best activity for his ankle surgery.     Plan:  Telephone follow up appointment with care management team member scheduled for:  ongoing follow up in 30 day TOC  program The care management team will reach out to the patient again over the next 7-10 business days. The patient has been provided with contact information for the care management team and has been advised to call with any health related questions or concerns.  The patient will call infectious disease provider for  as advised to symptoms of infection with PICC line or ankle, with swelling, increased pain, site with redness/pain.  Patient to get appointment with Orthopedic surgeon, patient plans to call his insurance company regarding his benefit for rides to appointments.         Patient verbalizes understanding of instructions and care plan provided today and agrees to view in MyChart. Active MyChart status and patient understanding of how to access instructions and care plan via MyChart confirmed with patient.     The patient has been provided with contact information for the care management team and has been advised to call with any health related questions or concerns.  The care management team will reach out to the patient again over the next 5-10 business days.   Please call the care guide team at [phone] if you need to cancel or reschedule your appointment.   Please call the Suicide and Crisis Lifeline: 988 call the [place]  National Suicide Prevention Lifeline: 612-767-9793 or TTY: 3470107550 TTY 571-784-0903) to talk to a trained counselor call 1-800-273-TALK (toll free, 24 hour hotline) go to [facility] Urgent [facility] [address] [place] [phone]) call 911 if  you are experiencing a Mental Health or Behavioral Health Crisis or need someone to talk to.  Richerd Fish, RN, BSN, CCM [facility], [facility] Management Coordinator Direct Dial: [phone]

## 2024-03-31 NOTE — Transitions of Care (Post Inpatient/ED Visit) (Signed)
 03/31/2024  Name: Thomas [name] MRN: [id number] DOB: [birth year]  Today's TOC FU Call Status: Today's TOC FU Call Status:: Successful TOC FU Call Completed TOC FU Call Complete Date: 03/31/24 Patient's Name and Date of Birth confirmed.  Transition Care Management Follow-up Telephone Call Date of Discharge: 03/30/24 Discharge Facility: [name]) Type of Discharge: Inpatient Admission Primary Inpatient Discharge Diagnosis:: Cellulitis; ORIF How have you been since you were released from the hospital?: Better Any questions or concerns?: No  Items Reviewed: Did you receive and understand the discharge instructions provided?: No Medications obtained,verified, and reconciled?: Yes (Medications Reviewed) Any new allergies since your discharge?: No Dietary orders reviewed?: NA Do you have support at home?: No (My mom and dad are elderly and they live with me; my sister is taking care of my dog. My dad is actually still in the hospital)  Medications Reviewed Today: Medications Reviewed Today     Reviewed by Thomas Richerd GRADE, RN (Registered Nurse) on 03/31/24 at 1447  Med List Status: <None>   Medication Order Taking? Sig Documenting Provider Last Dose Status Informant  albuterol  (VENTOLIN  HFA) 108 (90 Base) MCG/ACT inhaler [id number] Yes Inhale 1-2 puffs into the lungs every 6 (six) hours as needed for wheezing or shortness of breath. Thomas Lavonia SAILOR, [name]  Active Self, Pharmacy Records  aspirin EC 81 MG tablet [id number]  Take 1 tablet (81 mg total) by mouth 2 (two) times daily for 28 days. Swallow whole. Thomas Bernarda HERO, PA-C  Active            Med Note Thomas [name] Mar 31, 2024  9:57 AM) Need to have it picked up or delivered today 03/31/24  b complex vitamins capsule [id number] Yes Take 1 capsule by mouth daily. Provider, Historical, [name]  Active Self, Pharmacy Records  ceFAZolin  (ANCEF ) IVPB [id number] Yes Inject 2 g into the vein every 8 (eight) hours. Indication:   Right Ankle Osteomyelitis First Dose: Yes Last Day of Therapy:  05/02/2024 Labs - Once weekly:  CBC/D and BMP, Labs - Once weekly: ESR and CRP Method of administration: IV Push Method of administration may be changed at the discretion of home infusion pharmacist based upon assessment of the patient and/or caregiver's ability to self-administer the medication ordered. Thomas [name]  Active   clobetasol  cream (TEMOVATE ) 0.05 % [id number] Yes Apply topically 2 (two) times daily. Apply to Psoriatic plaques on the extremities, trunk or scalp. DO not apply to face, intertriginous areas (armpits, groin, under pannus, gluteal cleft), or genitals. Thomas Yetta HERO, [name]  Active Self, Pharmacy Records           Med Note Thomas [name] Mar 31, 2024  9:58 AM) Using with outbreaks  Cyanocobalamin (VITAMIN B12 PO) [id number] Yes Take 1 tablet by mouth daily. Provider, Historical, [name]  Active Self, Pharmacy Records  FOLIC ACID  PO [id number] Yes Take 1 tablet by mouth daily. Provider, Historical, [name]  Active Self, Pharmacy Records  furosemide  (LASIX ) 40 MG tablet [id number] Yes Take 1 tablet (40 mg total) by mouth daily. Thomas Yetta HERO, [name]  Active Self, Pharmacy Records  lactulose [facility]) 10 GM/15ML solution [id number] Yes Take 45 mLs (30 g total) by mouth 3 (three) times daily.  Patient taking differently: Take 30 g by mouth daily. Taking it only daily   Thomas [name]  Active Self, Pharmacy Records  loteprednol (LOTEMAX) 0.5 % ophthalmic suspension [id number]  Place 1 drop into both eyes 4 (  four) times daily.  Patient not taking: Reported on 03/31/2024   Provider, Historical, [name]  Active Self, Pharmacy Records  methocarbamol  (ROBAXIN ) 750 MG tablet [id number]  Take 1 tablet (750 mg total) by mouth every 6 (six) hours as needed for muscle spasms. Thomas [name]  Active   Multiple Vitamins-Minerals (ZINC PO) [id number] Yes Take 1 tablet by mouth daily. Provider, Historical, [name]  Active Self,  Pharmacy Records  nadolol  (CORGARD ) 20 MG tablet [id number]  Take 1 tablet (20 mg total) by mouth daily. Thomas [name]  Expired 03/20/24 2359 Self, Pharmacy Records  pantoprazole  (PROTONIX ) 40 MG tablet [id number] Yes TAKE 1 TABLET BY MOUTH TWICE A DAY Thomas [name]  Active   Secukinumab (COSENTYX UNOREADY) 300 MG/2ML Thomas [name] [id number]  Inject 300mg  into the skin at [facility] 0, 1, 2, 3 Rice, Thomas ORN, [name]  Active Self, Pharmacy Records           Med Note Thomas [name] Mar 31, 2024 10:00 AM) Patient states he was told by orthopedic that this may interfere with his new antibiotics  spironolactone  (ALDACTONE ) 25 MG tablet [id number]  Take 1 tablet (25 mg total) by mouth daily. Thomas Yetta HERO, [name]  Active Self, Pharmacy Records  VITAMIN D  PO [id number]  Take 1 tablet by mouth daily. Provider, Historical, [name]  Active Self, Pharmacy Records            Home Care and Equipment/Supplies: Were Home Health Services Ordered?: Yes Name of Home Health Agency:: [facility] Has Agency set up a time to come to your home?: No EMR reviewed for Home Health Orders: Orders present/patient has not received call (refer to CM for follow-up) (CM states 24 - 48 hours on AVS, and patient states he will call them information of information given on DC instructions) Any new equipment or medical supplies ordered?: Yes Name of Medical supply agency?: [employer] Were you able to get the equipment/medical supplies?: Yes Do you have any questions related to the use of the equipment/supplies?: No  Functional Questionnaire: Do you need assistance with bathing/showering or dressing?: Yes Do you need assistance with meal preparation?: Yes (Will order [employer] or [employer] delivery) Do you need assistance with eating?: No Do you have difficulty maintaining continence: No Do you need assistance with getting out of bed/getting out of a chair/moving?: No (I probably should get help but there's  too much I want done like cleaning up the bathroom and areas from when I went to the hospital, and I think I've over done it) Do you have difficulty managing or taking your medications?: No  Follow up appointments reviewed: PCP Follow-up appointment confirmed?: Yes Date of PCP follow-up appointment?: 04/05/24 Follow-up Provider: Berneta Elsie Sayre, [name] Specialist Hospital Follow-up appointment confirmed?: Yes Date of Specialist follow-up appointment?: 04/28/24 Follow-Up Specialty Provider:: Montie Bologna, [name] Infectious Disease Do you need transportation to your follow-up appointment?: No (I have a benefit with my insurance I will call in for) Do you understand care options if your condition(s) worsen?: Yes-patient verbalized understanding  SDOH Interventions Today    Flowsheet Row Most Recent Value  SDOH Interventions   Food Insecurity Interventions Intervention Not Indicated  Housing Interventions Intervention Not Indicated  Transportation Interventions Intervention Not Indicated  Utilities Interventions Intervention Not Indicated    Goals Addressed             This Visit's Progress    VBCI Transitions of Care (TOC) Care Plan  Problems:  Recent Hospitalization for treatment of Pulmonary Disease and Cellulitis right ankle infection with removal of hardware - osteomyelitis right ankle home IV therapy antibiotics - Outpatient Parenteral Antibiotic Therapy Information Antibiotic: Cefazolin  (Ancef ) IVPB; Indications for use: Right Ankle Osteomyelitis; End Date: 05/02/2024 [facility] [employer], delivered Knowledge Deficit Related to post hospital care and s/s of infections, activity level, post hospital follow up. Explained to patient regarding recommendation for aspirin 81 milligram twice daily for 4 weeks for DVT prophylaxis.   Goal:  Over the next 30 days, the patient will not experience hospital readmission  Interventions:  Transitions of Care: Doctor  Visits  - discussed the importance of doctor visits  Patient Self Care Activities:  Attend all scheduled provider appointments Call pharmacy for medication refills 3-7 days in advance of running out of medications Call provider office for new concerns or questions  Notify RN Care Manager of TOC call rescheduling needs Participate in Transition of Care Program/Attend TOC scheduled calls Take medications as prescribed   Patient encouraged to pick up or have Aspirin delivered Ongoing pain - post op reviewed, patient desires to not take Oxycodone  unless absolutely necessary, he states. Medications encouraged as prescribed. Reviewed measures to decrease pain such as resting limb, working with HHPT/OT on needs to maintain safety and best activity for his ankle surgery.    Plan:  Telephone follow up appointment with care management team member scheduled for:  ongoing follow up in 30 day TOC  program The care management team will reach out to the patient again over the next 7-10 business days. The patient has been provided with contact information for the care management team and has been advised to call with any health related questions or concerns.  The patient will call infectious disease provider for  as advised to symptoms of infection with PICC line or ankle, with swelling, increased pain, site with redness/pain.  Patient to get appointment with Orthopedic surgeon, patient plans to call his insurance company regarding his benefit for rides to appointments.         Richerd Fish, RN, BSN, CCM [facility], [facility] Management Coordinator Direct Dial: [phone]

## 2024-03-31 NOTE — Transitions of Care (Post Inpatient/ED Visit) (Signed)
 03/31/2024  Patient ID: Thomas [name], male   DOB: [birth year], 48 y.o.   MRN: [id number] Patient called regarding getting a motorized wheelchair and to see if PCP will order. Encouraged patient that paper work will need to be filled out and that mostly the physical therapy assessment/evaluation is likely needed for the type of equipment needed. Patient verbalized understanding and states he will reach out to his provider.  Plan follow up with patient next week and encouraged to call for any additional questions.  Richerd Fish, RN, BSN, CCM [facility], [facility] Management Coordinator Direct Dial: [phone]

## 2024-04-01 ENCOUNTER — Telehealth: Payer: Self-pay

## 2024-04-01 DIAGNOSIS — D509 Iron deficiency anemia, unspecified: Secondary | ICD-10-CM

## 2024-04-01 DIAGNOSIS — T847XXA Infection and inflammatory reaction due to other internal orthopedic prosthetic devices, implants and grafts, initial encounter: Secondary | ICD-10-CM | POA: Diagnosis not present

## 2024-04-01 NOTE — Transitions of Care (Post Inpatient/ED Visit) (Signed)
 04/01/2024  Patient ID: Thomas [name], male   DOB: March 17, 1976, 48 y.o.   MRN: [id number]  Patient called regarding home health informed him that they will not be able to do Home Health due to not in the network with [facility]. Patient states he needs the evaluation for getting a motorized wheelchair.  Patient made aware that he may need to be referred to Outpatient Physical Therapy by PCP or Orthopedic Surgeon. Patient states he is going to call [facility] about getting the home evaluation.  Patient states PCP has paper work just need the evaluation done.  Richerd Fish, RN, BSN, CCM [facility], [facility] Management Coordinator Direct Dial: [phone]

## 2024-04-02 DIAGNOSIS — T847XXA Infection and inflammatory reaction due to other internal orthopedic prosthetic devices, implants and grafts, initial encounter: Secondary | ICD-10-CM | POA: Diagnosis not present

## 2024-04-03 DIAGNOSIS — T847XXA Infection and inflammatory reaction due to other internal orthopedic prosthetic devices, implants and grafts, initial encounter: Secondary | ICD-10-CM | POA: Diagnosis not present

## 2024-04-04 ENCOUNTER — Telehealth: Payer: Self-pay | Admitting: Family Medicine

## 2024-04-04 DIAGNOSIS — T847XXA Infection and inflammatory reaction due to other internal orthopedic prosthetic devices, implants and grafts, initial encounter: Secondary | ICD-10-CM | POA: Diagnosis not present

## 2024-04-04 NOTE — Telephone Encounter (Signed)
 Patient dropped off document Home Health Certificate (Order ID [id number]), to be filled out by provider. Patient requested to send it back via Fax within 7-days. Document is located in providers tray at front office.Please advise at [phone] Home health cert. Came through fax . I put in the dr box

## 2024-04-05 ENCOUNTER — Other Ambulatory Visit: Payer: Self-pay

## 2024-04-05 ENCOUNTER — Encounter: Payer: Self-pay | Admitting: Family Medicine

## 2024-04-05 ENCOUNTER — Emergency Department (HOSPITAL_COMMUNITY)

## 2024-04-05 ENCOUNTER — Ambulatory Visit (INDEPENDENT_AMBULATORY_CARE_PROVIDER_SITE_OTHER): Admitting: Family Medicine

## 2024-04-05 ENCOUNTER — Encounter (HOSPITAL_COMMUNITY): Payer: Self-pay | Admitting: Internal Medicine

## 2024-04-05 ENCOUNTER — Inpatient Hospital Stay (HOSPITAL_COMMUNITY)
Admission: EM | Admit: 2024-04-05 | Discharge: 2024-04-12 | DRG: 433 | Disposition: A | Discharge status: Home Health | Attending: Internal Medicine | Admitting: Internal Medicine

## 2024-04-05 ENCOUNTER — Inpatient Hospital Stay (HOSPITAL_COMMUNITY)

## 2024-04-05 VITALS — BP 100/62 | HR 91 | Temp 99.3°F | Ht 65.0 in

## 2024-04-05 DIAGNOSIS — J948 Other specified pleural conditions: Secondary | ICD-10-CM | POA: Diagnosis present

## 2024-04-05 DIAGNOSIS — Z452 Encounter for adjustment and management of vascular access device: Secondary | ICD-10-CM | POA: Diagnosis not present

## 2024-04-05 DIAGNOSIS — R5381 Other malaise: Secondary | ICD-10-CM | POA: Diagnosis present

## 2024-04-05 DIAGNOSIS — Z7982 Long term (current) use of aspirin: Secondary | ICD-10-CM | POA: Diagnosis not present

## 2024-04-05 DIAGNOSIS — Z818 Family history of other mental and behavioral disorders: Secondary | ICD-10-CM

## 2024-04-05 DIAGNOSIS — T847XXD Infection and inflammatory reaction due to other internal orthopedic prosthetic devices, implants and grafts, subsequent encounter: Secondary | ICD-10-CM

## 2024-04-05 DIAGNOSIS — Z79899 Other long term (current) drug therapy: Secondary | ICD-10-CM

## 2024-04-05 DIAGNOSIS — T847XXA Infection and inflammatory reaction due to other internal orthopedic prosthetic devices, implants and grafts, initial encounter: Secondary | ICD-10-CM | POA: Diagnosis not present

## 2024-04-05 DIAGNOSIS — F17290 Nicotine dependence, other tobacco product, uncomplicated: Secondary | ICD-10-CM | POA: Diagnosis present

## 2024-04-05 DIAGNOSIS — G4733 Obstructive sleep apnea (adult) (pediatric): Secondary | ICD-10-CM | POA: Diagnosis present

## 2024-04-05 DIAGNOSIS — L4050 Arthropathic psoriasis, unspecified: Secondary | ICD-10-CM | POA: Diagnosis present

## 2024-04-05 DIAGNOSIS — J9811 Atelectasis: Secondary | ICD-10-CM | POA: Diagnosis present

## 2024-04-05 DIAGNOSIS — Y831 Surgical operation with implant of artificial internal device as the cause of abnormal reaction of the patient, or of later complication, without mention of misadventure at the time of the procedure: Secondary | ICD-10-CM | POA: Diagnosis present

## 2024-04-05 DIAGNOSIS — K7030 Alcoholic cirrhosis of liver without ascites: Principal | ICD-10-CM | POA: Diagnosis present

## 2024-04-05 DIAGNOSIS — E7220 Disorder of urea cycle metabolism, unspecified: Secondary | ICD-10-CM | POA: Diagnosis not present

## 2024-04-05 DIAGNOSIS — D649 Anemia, unspecified: Secondary | ICD-10-CM | POA: Diagnosis present

## 2024-04-05 DIAGNOSIS — D696 Thrombocytopenia, unspecified: Secondary | ICD-10-CM | POA: Diagnosis present

## 2024-04-05 DIAGNOSIS — E66813 Obesity, class 3: Secondary | ICD-10-CM | POA: Diagnosis present

## 2024-04-05 DIAGNOSIS — M7989 Other specified soft tissue disorders: Secondary | ICD-10-CM | POA: Diagnosis not present

## 2024-04-05 DIAGNOSIS — K7031 Alcoholic cirrhosis of liver with ascites: Secondary | ICD-10-CM | POA: Diagnosis not present

## 2024-04-05 DIAGNOSIS — E8770 Fluid overload, unspecified: Secondary | ICD-10-CM | POA: Diagnosis present

## 2024-04-05 DIAGNOSIS — Z6841 Body Mass Index (BMI) 40.0 and over, adult: Secondary | ICD-10-CM | POA: Diagnosis not present

## 2024-04-05 DIAGNOSIS — Z833 Family history of diabetes mellitus: Secondary | ICD-10-CM

## 2024-04-05 DIAGNOSIS — Z472 Encounter for removal of internal fixation device: Secondary | ICD-10-CM | POA: Diagnosis not present

## 2024-04-05 DIAGNOSIS — Z4682 Encounter for fitting and adjustment of non-vascular catheter: Secondary | ICD-10-CM | POA: Diagnosis not present

## 2024-04-05 DIAGNOSIS — Z8249 Family history of ischemic heart disease and other diseases of the circulatory system: Secondary | ICD-10-CM | POA: Diagnosis not present

## 2024-04-05 DIAGNOSIS — Z888 Allergy status to other drugs, medicaments and biological substances status: Secondary | ICD-10-CM

## 2024-04-05 DIAGNOSIS — J811 Chronic pulmonary edema: Secondary | ICD-10-CM | POA: Diagnosis not present

## 2024-04-05 DIAGNOSIS — Z23 Encounter for immunization: Secondary | ICD-10-CM

## 2024-04-05 DIAGNOSIS — I861 Scrotal varices: Secondary | ICD-10-CM | POA: Diagnosis present

## 2024-04-05 DIAGNOSIS — K7460 Unspecified cirrhosis of liver: Secondary | ICD-10-CM | POA: Diagnosis not present

## 2024-04-05 DIAGNOSIS — Z1152 Encounter for screening for COVID-19: Secondary | ICD-10-CM

## 2024-04-05 DIAGNOSIS — M19071 Primary osteoarthritis, right ankle and foot: Secondary | ICD-10-CM | POA: Diagnosis not present

## 2024-04-05 DIAGNOSIS — R0902 Hypoxemia: Secondary | ICD-10-CM | POA: Diagnosis not present

## 2024-04-05 DIAGNOSIS — I8511 Secondary esophageal varices with bleeding: Secondary | ICD-10-CM | POA: Diagnosis present

## 2024-04-05 DIAGNOSIS — Z885 Allergy status to narcotic agent status: Secondary | ICD-10-CM

## 2024-04-05 DIAGNOSIS — K7290 Hepatic failure, unspecified without coma: Secondary | ICD-10-CM

## 2024-04-05 DIAGNOSIS — R17 Unspecified jaundice: Secondary | ICD-10-CM | POA: Diagnosis not present

## 2024-04-05 DIAGNOSIS — R091 Pleurisy: Secondary | ICD-10-CM | POA: Diagnosis not present

## 2024-04-05 DIAGNOSIS — J918 Pleural effusion in other conditions classified elsewhere: Secondary | ICD-10-CM | POA: Diagnosis present

## 2024-04-05 DIAGNOSIS — R059 Cough, unspecified: Secondary | ICD-10-CM | POA: Diagnosis not present

## 2024-04-05 DIAGNOSIS — R0600 Dyspnea, unspecified: Principal | ICD-10-CM

## 2024-04-05 DIAGNOSIS — J90 Pleural effusion, not elsewhere classified: Secondary | ICD-10-CM | POA: Diagnosis not present

## 2024-04-05 DIAGNOSIS — R0602 Shortness of breath: Secondary | ICD-10-CM | POA: Diagnosis not present

## 2024-04-05 DIAGNOSIS — F1010 Alcohol abuse, uncomplicated: Secondary | ICD-10-CM | POA: Diagnosis present

## 2024-04-05 DIAGNOSIS — E876 Hypokalemia: Secondary | ICD-10-CM | POA: Diagnosis present

## 2024-04-05 DIAGNOSIS — R5383 Other fatigue: Secondary | ICD-10-CM | POA: Diagnosis not present

## 2024-04-05 DIAGNOSIS — T847XXS Infection and inflammatory reaction due to other internal orthopedic prosthetic devices, implants and grafts, sequela: Secondary | ICD-10-CM

## 2024-04-05 DIAGNOSIS — I8500 Esophageal varices without bleeding: Secondary | ICD-10-CM | POA: Diagnosis not present

## 2024-04-05 DIAGNOSIS — Z7401 Bed confinement status: Secondary | ICD-10-CM | POA: Diagnosis not present

## 2024-04-05 DIAGNOSIS — F1090 Alcohol use, unspecified, uncomplicated: Secondary | ICD-10-CM | POA: Diagnosis present

## 2024-04-05 LAB — CBC WITH DIFFERENTIAL/PLATELET
Abs Immature Granulocytes: 0.01 K/uL (ref 0.00–0.07)
Abs Immature Granulocytes: 0.01 K/uL (ref 0.00–0.07)
Basophils Absolute: 0.1 K/uL (ref 0.0–0.1)
Basophils Absolute: 0 K/uL (ref 0.0–0.1)
Basophils Relative: 1 %
Basophils Relative: 1 %
Eosinophils Absolute: 0.2 K/uL (ref 0.0–0.5)
Eosinophils Absolute: 0.2 K/uL (ref 0.0–0.5)
Eosinophils Relative: 5 %
Eosinophils Relative: 5 %
HCT: 33.3 % — ABNORMAL LOW (ref 39.0–52.0)
HCT: 31 % — ABNORMAL LOW (ref 39.0–52.0)
Hemoglobin: 10.4 g/dL — ABNORMAL LOW (ref 13.0–17.0)
Hemoglobin: 9.7 g/dL — ABNORMAL LOW (ref 13.0–17.0)
Immature Granulocytes: 0 %
Immature Granulocytes: 0 %
Lymphocytes Relative: 17 %
Lymphocytes Relative: 19 %
Lymphs Abs: 0.9 K/uL (ref 0.7–4.0)
Lymphs Abs: 0.9 K/uL (ref 0.7–4.0)
MCH: 32.3 pg (ref 26.0–34.0)
MCH: 32.2 pg (ref 26.0–34.0)
MCHC: 31.2 g/dL (ref 30.0–36.0)
MCHC: 31.3 g/dL (ref 30.0–36.0)
MCV: 103.4 fL — ABNORMAL HIGH (ref 80.0–100.0)
MCV: 103 fL — ABNORMAL HIGH (ref 80.0–100.0)
Monocytes Absolute: 0.5 K/uL (ref 0.1–1.0)
Monocytes Absolute: 0.5 K/uL (ref 0.1–1.0)
Monocytes Relative: 10 %
Monocytes Relative: 11 %
Neutro Abs: 3.5 K/uL (ref 1.7–7.7)
Neutro Abs: 3.2 K/uL (ref 1.7–7.7)
Neutrophils Relative %: 67 %
Neutrophils Relative %: 64 %
Platelets: 110 K/uL — ABNORMAL LOW (ref 150–400)
Platelets: 103 K/uL — ABNORMAL LOW (ref 150–400)
RBC: 3.22 MIL/uL — ABNORMAL LOW (ref 4.22–5.81)
RBC: 3.01 MIL/uL — ABNORMAL LOW (ref 4.22–5.81)
RDW: 15.7 % — ABNORMAL HIGH (ref 11.5–15.5)
RDW: 15.6 % — ABNORMAL HIGH (ref 11.5–15.5)
WBC: 5.2 K/uL (ref 4.0–10.5)
WBC: 4.9 K/uL (ref 4.0–10.5)
nRBC: 0 % (ref 0.0–0.2)
nRBC: 0 % (ref 0.0–0.2)

## 2024-04-05 LAB — TROPONIN T, HIGH SENSITIVITY
Troponin T High Sensitivity: <15 ng/L (ref 0–19)
Troponin T High Sensitivity: <15 ng/L (ref 0–19)

## 2024-04-05 LAB — BASIC METABOLIC PANEL WITH GFR
Anion gap: 4 — ABNORMAL LOW (ref 5–15)
BUN: 28 mg/dL — ABNORMAL HIGH (ref 6–20)
CO2: 31 mmol/L (ref 22–32)
Calcium: 8.6 mg/dL — ABNORMAL LOW (ref 8.9–10.3)
Chloride: 103 mmol/L (ref 98–111)
Creatinine, Ser: 0.77 mg/dL (ref 0.61–1.24)
GFR, Estimated: >60 mL/min (ref >60)
Glucose, Bld: 80 mg/dL (ref 70–99)
Potassium: 3.9 mmol/L (ref 3.5–5.1)
Sodium: 138 mmol/L (ref 135–145)

## 2024-04-05 LAB — COMPREHENSIVE METABOLIC PANEL WITH GFR
ALT: 13 U/L (ref 0–44)
AST: 86 U/L — ABNORMAL HIGH (ref 15–41)
Albumin: 2.4 g/dL — ABNORMAL LOW (ref 3.5–5.0)
Alkaline Phosphatase: 218 U/L — ABNORMAL HIGH (ref 38–126)
Anion gap: 6 (ref 5–15)
BUN: 26 mg/dL — ABNORMAL HIGH (ref 6–20)
CO2: 30 mmol/L (ref 22–32)
Calcium: 8.5 mg/dL — ABNORMAL LOW (ref 8.9–10.3)
Chloride: 103 mmol/L (ref 98–111)
Creatinine, Ser: 0.86 mg/dL (ref 0.61–1.24)
GFR, Estimated: >60 mL/min (ref >60)
Glucose, Bld: 86 mg/dL (ref 70–99)
Potassium: 3.9 mmol/L (ref 3.5–5.1)
Sodium: 140 mmol/L (ref 135–145)
Total Bilirubin: 3.2 mg/dL — ABNORMAL HIGH (ref 0.0–1.2)
Total Protein: 6 g/dL — ABNORMAL LOW (ref 6.5–8.1)

## 2024-04-05 LAB — ALBUMIN, PLEURAL OR PERITONEAL FLUID: Albumin, Fluid: <1.5 g/dL

## 2024-04-05 LAB — HEPATIC FUNCTION PANEL
ALT: 22 U/L (ref 0–44)
AST: 92 U/L — ABNORMAL HIGH (ref 15–41)
Albumin: 2.4 g/dL — ABNORMAL LOW (ref 3.5–5.0)
Alkaline Phosphatase: 235 U/L — ABNORMAL HIGH (ref 38–126)
Bilirubin, Direct: 1.8 mg/dL — ABNORMAL HIGH (ref 0.0–0.2)
Indirect Bilirubin: 2 mg/dL — ABNORMAL HIGH (ref 0.3–0.9)
Total Bilirubin: 3.7 mg/dL — ABNORMAL HIGH (ref 0.0–1.2)
Total Protein: 6.4 g/dL — ABNORMAL LOW (ref 6.5–8.1)

## 2024-04-05 LAB — RESP PANEL BY RT-PCR (RSV, FLU A&B, COVID)  RVPGX2
Influenza A by PCR: NEGATIVE
Influenza B by PCR: NEGATIVE
Resp Syncytial Virus by PCR: NEGATIVE
SARS Coronavirus 2 by RT PCR: NEGATIVE

## 2024-04-05 LAB — AMMONIA: Ammonia: 42 umol/L — ABNORMAL HIGH (ref 9–35)

## 2024-04-05 LAB — LACTATE DEHYDROGENASE, PLEURAL OR PERITONEAL FLUID: LD, Fluid: 33 U/L — ABNORMAL HIGH (ref 3–23)

## 2024-04-05 LAB — PROTIME-INR
INR: 1.6 — ABNORMAL HIGH (ref 0.8–1.2)
Prothrombin Time: 19.5 s — ABNORMAL HIGH (ref 11.4–15.2)

## 2024-04-05 LAB — PRO BRAIN NATRIURETIC PEPTIDE: Pro Brain Natriuretic Peptide: 102 pg/mL (ref <300.0)

## 2024-04-05 MED ORDER — BISACODYL 10 MG RE SUPP: 10.0000 mg | Freq: Every day | RECTAL | Status: DC | PRN

## 2024-04-05 MED ORDER — CEFAZOLIN SODIUM-DEXTROSE 2-4 GM/100ML-% IV SOLN
2.0000 g | Freq: Three times a day (TID) | INTRAVENOUS | Status: DC
  Administered 2024-04-05 – 2024-04-12 (×22): 2 g via INTRAVENOUS
  Filled 2024-04-05 (×24): qty 100

## 2024-04-05 MED ORDER — CEFAZOLIN SODIUM-DEXTROSE 2-4 GM/100ML-% IV SOLN: 2.0000 g | Freq: Three times a day (TID) | INTRAVENOUS | Status: DC

## 2024-04-05 MED ORDER — LACTULOSE 10 GM/15ML PO SOLN
10.0000 g | Freq: Once | ORAL | Status: AC
Start: 2024-04-05 — End: 2024-04-05
  Administered 2024-04-05: 10 g via ORAL
  Filled 2024-04-05: qty 30

## 2024-04-05 MED ORDER — ENOXAPARIN SODIUM 40 MG/0.4ML IJ SOSY
40.0000 mg | PREFILLED_SYRINGE | INTRAMUSCULAR | Status: DC
  Administered 2024-04-06 – 2024-04-07 (×2): 40 mg via SUBCUTANEOUS
  Filled 2024-04-05 (×2): qty 0.4

## 2024-04-05 MED ORDER — SODIUM CHLORIDE 0.9% FLUSH: 10.0000 mL | INTRAVENOUS | Status: DC | PRN

## 2024-04-05 MED ORDER — CHLORHEXIDINE GLUCONATE CLOTH 2 % EX PADS
6.0000 | MEDICATED_PAD | Freq: Every day | CUTANEOUS | Status: DC
  Administered 2024-04-05 – 2024-04-12 (×8): 6 via TOPICAL

## 2024-04-05 MED ORDER — POLYVINYL ALCOHOL 1.4 % OP SOLN: 1.0000 [drp] | OPHTHALMIC | Status: DC | PRN

## 2024-04-05 MED ORDER — GUAIFENESIN-DM 100-10 MG/5ML PO SYRP
5.0000 mL | ORAL_SOLUTION | Freq: Four times a day (QID) | ORAL | Status: DC | PRN
  Administered 2024-04-09: 5 mL via ORAL
  Filled 2024-04-05: qty 5

## 2024-04-05 MED ORDER — FOLIC ACID 1 MG PO TABS
1.0000 mg | ORAL_TABLET | Freq: Every day | ORAL | Status: DC
  Administered 2024-04-06 – 2024-04-12 (×7): 1 mg via ORAL
  Filled 2024-04-05 (×7): qty 1

## 2024-04-05 MED ORDER — TRAZODONE HCL 50 MG PO TABS
25.0000 mg | ORAL_TABLET | Freq: Every evening | ORAL | Status: DC | PRN
  Administered 2024-04-07: 50 mg via ORAL
  Filled 2024-04-05 (×2): qty 1

## 2024-04-05 MED ORDER — PROCHLORPERAZINE 25 MG RE SUPP: 12.5000 mg | Freq: Four times a day (QID) | RECTAL | Status: DC | PRN

## 2024-04-05 MED ORDER — ACETAMINOPHEN 325 MG PO TABS
325.0000 mg | ORAL_TABLET | ORAL | Status: DC | PRN
  Administered 2024-04-06: 650 mg via ORAL
  Administered 2024-04-07: 325 mg via ORAL
  Administered 2024-04-07 – 2024-04-12 (×4): 650 mg via ORAL
  Filled 2024-04-05 (×7): qty 2

## 2024-04-05 MED ORDER — FUROSEMIDE 10 MG/ML IJ SOLN
20.0000 mg | Freq: Two times a day (BID) | INTRAMUSCULAR | Status: DC
  Administered 2024-04-06: 20 mg via INTRAVENOUS
  Filled 2024-04-05: qty 2

## 2024-04-05 MED ORDER — FLEET ENEMA RE ENEM: 1.0000 | ENEMA | Freq: Once | RECTAL | Status: DC | PRN

## 2024-04-05 MED ORDER — ONDANSETRON HCL 4 MG PO TABS: 4.0000 mg | ORAL_TABLET | Freq: Four times a day (QID) | ORAL | Status: DC | PRN

## 2024-04-05 MED ORDER — INFLUENZA VIRUS VACC SPLIT PF (FLUZONE) 0.5 ML IM SUSY
0.5000 mL | PREFILLED_SYRINGE | INTRAMUSCULAR | Status: AC
  Administered 2024-04-06: 0.5 mL via INTRAMUSCULAR
  Filled 2024-04-05: qty 0.5

## 2024-04-05 MED ORDER — CLOBETASOL PROPIONATE 0.05 % EX CREA
TOPICAL_CREAM | Freq: Two times a day (BID) | CUTANEOUS | Status: DC
  Administered 2024-04-09: 1 via TOPICAL
  Filled 2024-04-05: qty 15

## 2024-04-05 MED ORDER — PANTOPRAZOLE SODIUM 40 MG PO TBEC
40.0000 mg | DELAYED_RELEASE_TABLET | Freq: Two times a day (BID) | ORAL | Status: DC
  Administered 2024-04-05 – 2024-04-12 (×14): 40 mg via ORAL
  Filled 2024-04-05 (×14): qty 1

## 2024-04-05 MED ORDER — LIDOCAINE-EPINEPHRINE 1 %-1:100000 IJ SOLN
INTRAMUSCULAR | Status: AC
  Filled 2024-04-05: qty 1

## 2024-04-05 MED ORDER — NADOLOL 20 MG PO TABS
20.0000 mg | ORAL_TABLET | Freq: Every day | ORAL | Status: DC
  Administered 2024-04-05 – 2024-04-12 (×8): 20 mg via ORAL
  Filled 2024-04-05 (×8): qty 1

## 2024-04-05 MED ORDER — ALBUTEROL SULFATE (2.5 MG/3ML) 0.083% IN NEBU: 2.5000 mg | INHALATION_SOLUTION | RESPIRATORY_TRACT | Status: DC | PRN

## 2024-04-05 MED ORDER — PROCHLORPERAZINE MALEATE 10 MG PO TABS: 5.0000 mg | ORAL_TABLET | Freq: Four times a day (QID) | ORAL | Status: DC | PRN

## 2024-04-05 MED ORDER — PROCHLORPERAZINE EDISYLATE 10 MG/2ML IJ SOLN: 5.0000 mg | Freq: Four times a day (QID) | INTRAMUSCULAR | Status: DC | PRN

## 2024-04-05 MED ORDER — OXYCODONE HCL 5 MG PO TABS: 5.0000 mg | ORAL_TABLET | ORAL | Status: DC | PRN

## 2024-04-05 MED ORDER — SODIUM CHLORIDE 0.9% FLUSH
10.0000 mL | Freq: Two times a day (BID) | INTRAVENOUS | Status: DC
  Administered 2024-04-05 – 2024-04-10 (×10): 10 mL
  Administered 2024-04-11: 30 mL
  Administered 2024-04-11 – 2024-04-12 (×2): 10 mL

## 2024-04-05 MED ORDER — ONDANSETRON HCL 4 MG/2ML IJ SOLN: 4.0000 mg | Freq: Four times a day (QID) | INTRAMUSCULAR | Status: DC | PRN

## 2024-04-05 MED ORDER — LOTEPREDNOL ETABONATE 0.5 % OP SUSP
1.0000 [drp] | Freq: Four times a day (QID) | OPHTHALMIC | Status: DC
  Administered 2024-04-09: 1 [drp] via OPHTHALMIC
  Filled 2024-04-05: qty 5

## 2024-04-05 MED ORDER — DIPHENHYDRAMINE HCL 25 MG PO CAPS: 25.0000 mg | ORAL_CAPSULE | Freq: Four times a day (QID) | ORAL | Status: DC | PRN

## 2024-04-05 MED ORDER — FUROSEMIDE 10 MG/ML IJ SOLN
20.0000 mg | Freq: Once | INTRAMUSCULAR | Status: AC
Start: 2024-04-05 — End: 2024-04-05
  Administered 2024-04-05: 20 mg via INTRAVENOUS
  Filled 2024-04-05: qty 4

## 2024-04-05 MED ORDER — ALUM & MAG HYDROXIDE-SIMETH 200-200-20 MG/5ML PO SUSP: 30.0000 mL | ORAL | Status: DC | PRN

## 2024-04-05 NOTE — Progress Notes (Signed)
 Established Patient Office Visit   Subjective:  Patient ID: Thomas [name], male    DOB: Jan 31, 1976  Age: 48 y.o. MRN: [id number]  Chief Complaint  Patient presents with   Hospitalization Follow-up    Hospital follow up. Discharge 10/22. Wound infection. Pt states he is better. Pt states he is confused and abnormal speech.     HPI Encounter Diagnoses  Name Primary?   Hypoxia Yes   Hydrothorax    Scleral icterus    For hospital discharge follow-up where he had been admitted for surgical debridement of the wound infection involving hardware placed in 2024 for right ankle fracture and a hydrothorax.  Also carries a history of obesity alcoholic cirrhosis of the liver and psoriatic arthritis.  Patient is concerned about his mentation.  Transfer to a rehab center was recommended but he wanted to go home.  He has been receiving IV antibiotics through a PICC line.  He has been experiencing shortness of breath and malaise.  He had been placed on prednisone  and oxycodone .  It has been difficult for him to take these medications  and he discontinued them.  Follow-up with orthopedics is planned for Thursday.  His father is currently in hospice.   He has been sober now for several months.   Review of Systems  Constitutional:  Positive for malaise/fatigue.  Respiratory:  Positive for shortness of breath.   Cardiovascular:  Negative for chest pain.  Musculoskeletal:  Positive for joint pain.     Current Outpatient Medications:    albuterol  (VENTOLIN  HFA) 108 (90 Base) MCG/ACT inhaler, Inhale 1-2 puffs into the lungs every 6 (six) hours as needed for wheezing or shortness of breath., Disp: 8 g, Rfl: 1   aspirin EC 81 MG tablet, Take 1 tablet (81 mg total) by mouth 2 (two) times daily for 28 days. Swallow whole., Disp: , Rfl:    b complex vitamins capsule, Take 1 capsule by mouth daily., Disp: , Rfl:    ceFAZolin  (ANCEF ) IVPB, Inject 2 g into the vein every 8 (eight) hours. Indication:  Right Ankle  Osteomyelitis First Dose: Yes Last Day of Therapy:  05/02/2024 Labs - Once weekly:  CBC/D and BMP, Labs - Once weekly: ESR and CRP Method of administration: IV Push Method of administration may be changed at the discretion of home infusion pharmacist based upon assessment of the patient and/or caregiver's ability to self-administer the medication ordered., Disp: 39 Units, Rfl: 0   clobetasol  cream (TEMOVATE ) 0.05 %, Apply topically 2 (two) times daily. Apply to Psoriatic plaques on the extremities, trunk or scalp. DO not apply to face, intertriginous areas (armpits, groin, under pannus, gluteal cleft), or genitals., Disp: 60 g, Rfl: 0   Cyanocobalamin (VITAMIN B12 PO), Take 1 tablet by mouth daily., Disp: , Rfl:    FOLIC ACID  PO, Take 1 tablet by mouth daily., Disp: , Rfl:    furosemide  (LASIX ) 40 MG tablet, Take 1 tablet (40 mg total) by mouth daily., Disp: 30 tablet, Rfl: 5   methocarbamol  (ROBAXIN ) 750 MG tablet, Take 1 tablet (750 mg total) by mouth every 6 (six) hours as needed for muscle spasms., Disp: 30 tablet, Rfl: 0   Multiple Vitamins-Minerals (ZINC PO), Take 1 tablet by mouth daily., Disp: , Rfl:    pantoprazole  (PROTONIX ) 40 MG tablet, TAKE 1 TABLET BY MOUTH TWICE A DAY, Disp: 180 tablet, Rfl: 0   spironolactone  (ALDACTONE ) 25 MG tablet, Take 1 tablet (25 mg total) by mouth daily., Disp: 30 tablet,  Rfl: 0   VITAMIN D  PO, Take 1 tablet by mouth daily., Disp: , Rfl:    lactulose (CHRONULAC) 10 GM/15ML solution, Take 45 mLs (30 g total) by mouth 3 (three) times daily. (Patient taking differently: Take 30 g by mouth daily. Taking it only daily), Disp: 236 mL, Rfl: 0   loteprednol (LOTEMAX) 0.5 % ophthalmic suspension, Place 1 drop into both eyes 4 (four) times daily. (Patient not taking: Reported on 04/05/2024), Disp: , Rfl:    nadolol  (CORGARD ) 20 MG tablet, Take 1 tablet (20 mg total) by mouth daily. (Patient not taking: Reported on 04/05/2024), Disp: 30 tablet, Rfl: 11   Secukinumab  (COSENTYX UNOREADY) 300 MG/2ML SOAJ, Inject 300mg  into the skin at Weeks 0, 1, 2, 3 (Patient not taking: Reported on 04/05/2024), Disp: 8 mL, Rfl: 0   Objective:     BP 100/62 (BP Location: Left Arm, Patient Position: Sitting, Cuff Size: Large)   Pulse 91   Temp 99.3 F (37.4 C) (Temporal)   Ht 5' 5 (1.651 m)   SpO2 92%   BMI 43.93 kg/m    Physical Exam Constitutional:      General: He is not in acute distress.    Appearance: Normal appearance. He is obese. He is not ill-appearing, toxic-appearing or diaphoretic.  HENT:     Head: Normocephalic and atraumatic.     Right Ear: External ear normal.     Left Ear: External ear normal.     Mouth/Throat:     Mouth: Mucous membranes are moist.     Pharynx: Oropharynx is clear. No oropharyngeal exudate or posterior oropharyngeal erythema.  Eyes:     General: Scleral icterus present.        Right eye: No discharge.        Left eye: No discharge.     Extraocular Movements: Extraocular movements intact.     Conjunctiva/sclera: Conjunctivae normal.     Pupils: Pupils are equal, round, and reactive to light.  Cardiovascular:     Rate and Rhythm: Normal rate and regular rhythm.  Pulmonary:     Effort: Pulmonary effort is normal. No respiratory distress.     Breath sounds: Examination of the right-upper field reveals decreased breath sounds. Examination of the right-middle field reveals decreased breath sounds. Examination of the right-lower field reveals decreased breath sounds. Decreased breath sounds present. No rhonchi or rales.  Abdominal:     General: Bowel sounds are normal.     Tenderness: There is no abdominal tenderness. There is no guarding.  Musculoskeletal:     Cervical back: No rigidity or tenderness.  Skin:    General: Skin is warm and dry.     Coloration: Skin is not jaundiced.  Neurological:     Mental Status: He is alert and oriented to person, place, and time.  Psychiatric:        Mood and Affect: Mood normal.         Behavior: Behavior normal.      No results found for any visits on 04/05/24.    The 10-year ASCVD risk score (Arnett DK, et al., 2019) is: 2.4%    Assessment & Plan:   Hypoxia  Hydrothorax  Scleral icterus    Return in about 2 weeks (around 04/19/2024), or EMS was summoned for transportation to the hospital..    Elsie Sim Lent, MD

## 2024-04-05 NOTE — H&P (Addendum)
 History and Physical    Patient: Thomas [name] FMW:[id number] DOB: [birth year] DOA: 04/05/2024 DOS: the patient was seen and examined on 04/05/2024 PCP: Berneta Elsie Sayre, MD  Patient coming from: Home  Chief Complaint:  Chief Complaint  Patient presents with   Shortness of Breath   HPI: Thomas [name] is a 48 y.o. male with medical history significant for psoriatic arthritis, psoriasis, alcoholic liver cirrhosis, and obesity was just discharged 6 days ago.  The patient was hospitalized from October 12 through October 22.  He had infection of the hardware in his right ankle and is status post removal of the hardware and is on outpatient IV antibiotics through PICC line.  The patient has been compliant with his 3 times a day antibiotics.  He has been compliant with his other medications including lactulose, spironolactone , and Lasix , but he noticed 2 days ago that he started to develop lots of swelling particularly in his left leg.  He then noticed that he started to become short of breath.  He went to see his primary care doctor this morning who advised that he come to the ED because of the decreased breath sounds on the right.  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   Review of Systems: As mentioned in the history of present illness. All other systems reviewed and are negative. Past Medical History:  Diagnosis Date   Acute conjunctivitis of both eyes 10/22/2021   Acute sinusitis 06/16/2013   Alcoholic cirrhosis (HCC)    Alcoholic fatty liver 01/16/2010   Needs final HBV and HAV vaccines on or after 10/25/2012    Alcoholism (HCC) 12/25/2011   Allergic rhinitis    Childhood asthma    Elevated transaminase level 06/10/2007   AST: 80 ALT: 136 in 8/11: Hepatitis A., B and C negative.    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    Sleep apnea    Past Surgical History:  Procedure Laterality Date   ESOPHAGEAL BANDING  12/26/2022   Procedure: ESOPHAGEAL BANDING;  Surgeon: Shila Gustav GAILS, MD;  Location: [facility];  Service: Gastroenterology;;   ESOPHAGOGASTRODUODENOSCOPY (EGD) WITH PROPOFOL  N/A 07/05/2022   Procedure: ESOPHAGOGASTRODUODENOSCOPY (EGD) WITH PROPOFOL ;  Surgeon: Shila Gustav GAILS, MD;  Location: [facility];  Service: Gastroenterology;  Laterality: N/A;   ESOPHAGOGASTRODUODENOSCOPY (EGD) WITH PROPOFOL  N/A 12/26/2022   Procedure: ESOPHAGOGASTRODUODENOSCOPY (EGD) WITH PROPOFOL ;  Surgeon: Shila Gustav GAILS, MD;  Location: [facility];  Service: Gastroenterology;  Laterality: N/A;   ESOPHAGOGASTRODUODENOSCOPY (EGD) WITH PROPOFOL  N/A 02/12/2023   Procedure: ESOPHAGOGASTRODUODENOSCOPY (EGD) WITH PROPOFOL ;  Surgeon: Shila Gustav GAILS, MD;  Location: [facility];  Service: Gastroenterology;  Laterality: N/A;   INCISION AND DRAINAGE OF DEEP ABSCESS, ANKLE Right 03/21/2024   Procedure: INCISION AND DRAINAGE OF DEEP ABSCESS, ANKLE;  Surgeon: Edna Toribio LABOR, MD;  Location: [facility] ORS;  Service: Orthopedics;  Laterality: Right;   ORIF ANKLE FRACTURE Right 12/28/2022   Procedure: OPEN REDUCTION INTERNAL FIXATION (ORIF) ANKLE FRACTURE;  Surgeon: Josefina Chew, MD;  Location: [facility] OR;  Service: Orthopedics;  Laterality: Right;   Social History:  reports that he has been smoking cigars. He has been exposed to tobacco smoke. He has never used smokeless tobacco. He reports that he does not currently use alcohol. He reports that  he does not use drugs.  Allergies  Allergen Reactions   Codeine Other (See Comments)    Jittery     Family History  Problem Relation Age of Onset   Liver disease Mother    Depression Mother    Liver disease Father    Diabetes Father    Hypertension Father    Liver disease Sister    Diabetes  Other    Colon cancer Neg Hx    Esophageal cancer Neg Hx    Stomach cancer Neg Hx    Colon polyps Neg Hx     Prior to Admission medications   Medication Sig Start Date End Date Taking? Authorizing Provider  albuterol  (VENTOLIN  HFA) 108 (90 Base) MCG/ACT inhaler Inhale 1-2 puffs into the lungs every 6 (six) hours as needed for wheezing or shortness of breath. 03/11/24   Simon Lavonia SAILOR, MD  aspirin EC 81 MG tablet Take 1 tablet (81 mg total) by mouth 2 (two) times daily for 28 days. Swallow whole. 03/22/24 04/19/24  Renae Bernarda HERO, PA-C  b complex vitamins capsule Take 1 capsule by mouth daily.    [provider]  ceFAZolin  (ANCEF ) IVPB Inject 2 g into the vein every 8 (eight) hours. Indication:  Right Ankle Osteomyelitis First Dose: Yes Last Day of Therapy:  05/02/2024 Labs - Once weekly:  CBC/D and BMP, Labs - Once weekly: ESR and CRP Method of administration: IV Push Method of administration may be changed at the discretion of home infusion pharmacist based upon assessment of the patient and/or caregiver's ability to self-administer the medication ordered. 03/24/24 05/02/24  [name], MD  clobetasol  cream (TEMOVATE ) 0.05 % Apply topically 2 (two) times daily. Apply to Psoriatic plaques on the extremities, trunk or scalp. DO not apply to face, intertriginous areas (armpits, groin, under pannus, gluteal cleft), or genitals. 02/24/24   Patel, Pranav M, MD  Cyanocobalamin (VITAMIN B12 PO) Take 1 tablet by mouth daily.    [provider]  FOLIC ACID  PO Take 1 tablet by mouth daily.    [provider]  furosemide  (LASIX ) 40 MG tablet Take 1 tablet (40 mg total) by mouth daily. 02/24/24   Tobie Yetta HERO, MD  lactulose (CHRONULAC) 10 GM/15ML solution Take 45 mLs (30 g total) by mouth 3 (three) times daily. Patient taking differently: Take 30 g by mouth daily. Taking it only daily 03/13/24   Dasie Faden, MD  loteprednol (LOTEMAX) 0.5 % ophthalmic suspension  Place 1 drop into both eyes 4 (four) times daily. Patient not taking: Reported on 04/05/2024 02/01/24   [provider]  methocarbamol  (ROBAXIN ) 750 MG tablet Take 1 tablet (750 mg total) by mouth every 6 (six) hours as needed for muscle spasms. 03/30/24   Dibia, Pauline E, MD  Multiple Vitamins-Minerals (ZINC PO) Take 1 tablet by mouth daily.    [provider]  nadolol  (CORGARD ) 20 MG tablet Take 1 tablet (20 mg total) by mouth daily. Patient not taking: Reported on 04/05/2024 03/11/23 03/20/24  Beather Delon Gibson, PA  pantoprazole  (PROTONIX ) 40 MG tablet TAKE 1 TABLET BY MOUTH TWICE A DAY 03/24/24   Berneta Elsie Sayre, MD  Secukinumab (COSENTYX UNOREADY) 300 MG/2ML SOAJ Inject 300mg  into the [facility] at [facility] 0, 1, 2, 3 Patient not taking: Reported on 04/05/2024 03/10/24   Jeannetta Lonni ORN, MD  spironolactone  (ALDACTONE ) 25 MG tablet Take 1 tablet (25 mg total) by mouth daily. 02/25/24   Tobie Yetta HERO, MD  VITAMIN D  PO Take 1  tablet by mouth daily.    [provider]    Physical Exam: Vitals:   04/05/24 1315 04/05/24 1415 04/05/24 1500 04/05/24 1515  BP: 135/74 (!) 117/58 (!) 118/56 114/68  Pulse: 86 89 81 79  Resp: 16 19 18 17   Temp:      TempSrc:      SpO2: 100% 99% 99% 98%   Physical Exam:  General: jaundiced, No acute distress, obese HEENT: Normocephalic, atraumatic, PERRL Cardiovascular: Normal rate and rhythm. Distal pulses intact. Pulmonary: Normal pulmonary effort, normal breath sounds Gastrointestinal: distended abdomen, non-tender, hypoactive bowel sounds Musculoskeletal:4+ left lower ext edema, right lower ext in immobilizing wrap Skin: Skin is warm and dry. Neuro: No focal deficits noted, AAOx3. PSYCH: Attentive and cooperative  Data Reviewed:  Results for orders placed or performed during the hospital encounter of 04/05/24 (from the past 24 hours)  Resp panel by RT-PCR (RSV, Flu A&B, Covid) Anterior Nasal Swab     Status: None    Collection Time: 04/05/24  1:13 PM   Specimen: Anterior Nasal Swab  Result Value Ref Range   SARS Coronavirus 2 by RT PCR NEGATIVE NEGATIVE   Influenza A by PCR NEGATIVE NEGATIVE   Influenza B by PCR NEGATIVE NEGATIVE   Resp Syncytial Virus by PCR NEGATIVE NEGATIVE  Ammonia     Status: Abnormal   Collection Time: 04/05/24  1:30 PM  Result Value Ref Range   Ammonia 42 (H) 9 - 35 umol/L  Basic metabolic panel     Status: Abnormal   Collection Time: 04/05/24  1:31 PM  Result Value Ref Range   Sodium 138 135 - 145 mmol/L   Potassium 3.9 3.5 - 5.1 mmol/L   Chloride 103 98 - 111 mmol/L   CO2 31 22 - 32 mmol/L   Glucose, Bld 80 70 - 99 mg/dL   BUN 28 (H) 6 - 20 mg/dL   Creatinine, Ser 9.22 0.61 - 1.24 mg/dL   Calcium 8.6 (L) 8.9 - 10.3 mg/dL   GFR, Estimated >39 >39 mL/min   Anion gap 4 (L) 5 - 15  Pro Brain natriuretic peptide     Status: None   Collection Time: 04/05/24  1:31 PM  Result Value Ref Range   Pro Brain Natriuretic Peptide 102.0 <300.0 pg/mL  CBC with Differential     Status: Abnormal   Collection Time: 04/05/24  1:31 PM  Result Value Ref Range   WBC 5.2 4.0 - 10.5 K/uL   RBC 3.22 (L) 4.22 - 5.81 MIL/uL   Hemoglobin 10.4 (L) 13.0 - 17.0 g/dL   HCT 66.6 (L) 60.9 - 47.9 %   MCV 103.4 (H) 80.0 - 100.0 fL   MCH 32.3 26.0 - 34.0 pg   MCHC 31.2 30.0 - 36.0 g/dL   RDW 84.2 (H) 88.4 - 84.4 %   Platelets 110 (L) 150 - 400 K/uL   nRBC 0.0 0.0 - 0.2 %   Neutrophils Relative % 67 %   Neutro Abs 3.5 1.7 - 7.7 K/uL   Lymphocytes Relative 17 %   Lymphs Abs 0.9 0.7 - 4.0 K/uL   Monocytes Relative 10 %   Monocytes Absolute 0.5 0.1 - 1.0 K/uL   Eosinophils Relative 5 %   Eosinophils Absolute 0.2 0.0 - 0.5 K/uL   Basophils Relative 1 %   Basophils Absolute 0.1 0.0 - 0.1 K/uL   Immature Granulocytes 0 %   Abs Immature Granulocytes 0.01 0.00 - 0.07 K/uL  Troponin T, High Sensitivity  Status: None   Collection Time: 04/05/24  1:31 PM  Result Value Ref Range   Troponin T  High Sensitivity <15 0 - 19 ng/L  Protime-INR     Status: Abnormal   Collection Time: 04/05/24  1:31 PM  Result Value Ref Range   Prothrombin Time 19.5 (H) 11.4 - 15.2 seconds   INR 1.6 (H) 0.8 - 1.2     Assessment and Plan: Large right pleural effusion -IR has been consulted and the patient will go for thoracentesis today.   2.  Volume overload/decompensated cirrhosis  - the patient will be placed on IV Lasix  for diuresis.  3.  Jaundice -patient reports that he is more jaundiced than usual.  He is not sure why. -Will check T. bili  4.  Minimally elevated ammonia level -the patient has been compliant with his lactulose but his dose was decreased to once daily from 3-8 times a day.  He has been given a dose in the emergency department.     - Resume Lactulose at previous dose 30 mg.  Will continue once daily  5.  Status post removal of infected hardware in right ankle 2 weeks ago - consult Beverley Economy to continue care of that ankle.  Stitches are scheduled to be removed in 2 days. - Continue IV Ancef  q 8  6.  Psoriatic arthritis -was previously on DMARD but it was held due to his infection.    Advance Care Planning:   Code Status: Prior  The patient names his sister as a runner, broadcasting/film/video and wants to be full code.  Consults: Interventional radiology  Family Communication: None  Severity of Illness: The appropriate patient status for this patient is INPATIENT. Inpatient status is judged to be reasonable and necessary in order to provide the required intensity of service to ensure the patient's safety. The patient's presenting symptoms, physical exam findings, and initial radiographic and laboratory data in the context of their chronic comorbidities is felt to place them at high risk for further clinical deterioration. Furthermore, it is not anticipated that the patient will be medically stable for discharge from the hospital within 2 midnights of admission.   * I certify  that at the point of admission it is my clinical judgment that the patient will require inpatient hospital care spanning beyond 2 midnights from the point of admission due to high intensity of service, high risk for further deterioration and high frequency of surveillance required.*  Author: ARTHEA CHILD, MD 04/05/2024 4:05 PM  For on call review [email or web address].

## 2024-04-05 NOTE — ED Triage Notes (Signed)
 BIBA from UC for follow up after hydrothorax, and infection in hardware in his right foot. Pt has c/o [facility] with exertion, intermittent confusion, hallucinations. PICC line in right arm 98% nasal cannula @ 2lpm 80 hr 136/83 bp

## 2024-04-05 NOTE — Procedures (Addendum)
 PROCEDURE SUMMARY:  Successful image-guided diagnostic and therapeutic thoracentesis from the right chest.  Yielded 1.75 liters of hazy, yellow fluid.  No immediate complications.  EBL: zero Patient tolerated well.   Specimen sent for labs per conversation with EDP. Additional fluid sent to lab in case additional tests wish to be added by IP team.  Post-procedure CXR ordered.  Please see imaging section of Epic for full dictation.  Donnella Morford NP 04/05/2024 5:00 PM

## 2024-04-05 NOTE — ED Provider Notes (Signed)
 [facility] Provider Note  CSN: [id number] Arrival date & time: 04/05/24 1245  Chief Complaint(s) Shortness of Breath  HPI Thomas [name] is a 48 y.o. male with past medical history as below, significant for alcoholic cirrhosis, sleep apnea, obesity, IDA, hydrothorax who presents to the ED with complaint of dib  Patient reports ongoing difficulty breathing over the past few days, worsened with exertion.  Does improve with rest.  Typically does not wear home oxygen.  He was seen at PCP office earlier today and there is concern for recurrent pleural effusion.  He was sent to the ER for further evaluation.  Patient denies any symptoms of chest pain, has nonproductive cough, no fevers or chills.  No abdominal pain, does not feel as though his abdomen is more distended than normal.  He does feel as though his jaundice has worsened.  Swelling to his lower extremities also seems to worsen.  Rpts his Lasix  was discontinued  Past Medical History Past Medical History:  Diagnosis Date   Acute conjunctivitis of both eyes 10/22/2021   Acute sinusitis 06/16/2013   Alcoholic cirrhosis (HCC)    Alcoholic fatty liver 01/16/2010   Needs final HBV and HAV vaccines on or after 10/25/2012    Alcoholism (HCC) 12/25/2011   Allergic rhinitis    Childhood asthma    Elevated transaminase level 06/10/2007   AST: 80 ALT: 136 in 8/11: Hepatitis A., B and C negative.    Gastroenteritis 08/26/2021   Hand pain, left 07/28/2022   Hordeolum externum of right upper eyelid 08/14/2022   IDA (iron deficiency anemia)    Morbid obesity (HCC)    Scrotal varices 01/07/2010   Followed at [facility] urology.    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    Wound infection complicating hardware, initial encounter 03/20/2024   High risk medication use 02/29/2024   Pleural effusion associated with hepatic disorder 02/20/2024   Other ascites 06/18/2023   Cellulitis of abdominal wall 03/10/2023   Esophageal varices without bleeding (HCC) 02/13/2023   History of ETOH abuse 01/09/2023   Ankle fracture 01/07/2023   Localized edema 01/07/2023   Closed right ankle fracture 01/06/2023   Thrombocytopenia 01/06/2023   Cirrhosis of liver (HCC) 12/25/2022   Secondary esophageal varices with bleeding (HCC) 12/25/2022   Reactive airway disease 07/28/2022   Acute upper GI bleed 07/05/2022   Hematemesis with nausea 07/05/2022   Melena 07/05/2022   Pharyngitis 10/22/2021   Psoriatic arthritis (HCC) 10/03/2021   Viral upper respiratory tract infection 10/03/2021   Acute blood loss anemia 08/26/2021   Screening for tuberculosis 08/15/2021   Elevated BP without diagnosis of hypertension 06/14/2021   Need for immunization against influenza 06/14/2021   Alcoholic cirrhosis of liver (HCC) 09/26/2019   Class 3 severe obesity due to excess calories with body mass index (BMI) of 50.0 to 59.9 in adult [facility]) 09/26/2019   Healthcare maintenance 02/07/2019   Psoriasis 02/07/2019   Vitamin D  deficiency 05/07/2016   OSA (obstructive sleep apnea) 06/23/2013   Insomnia 06/16/2013   Alcohol use disorder 12/25/2011   Scrotal varices, 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   Alcoholic fatty liver 01/16/2010   Allergic rhinitis 10/12/2007   Home Medication(s) Prior to Admission medications   Medication Sig Start Date  End Date Taking? Authorizing Provider  acetaminophen  (TYLENOL ) 325 MG tablet Take 325-650 mg by mouth every 6 (six) hours as needed for mild pain (pain score 1-3) (or headaches).   Yes [provider]  albuterol  (VENTOLIN  HFA) 108 (90 Base) MCG/ACT inhaler Inhale 1-2 puffs into the lungs every 6 (six) hours as needed for wheezing or shortness of breath. 03/11/24  Yes  Simon Lavonia SAILOR, MD  ARTIFICIAL TEARS 1 % ophthalmic solution Place 1 drop into both eyes 3 (three) times daily as needed (for dryness).   Yes [provider]  aspirin EC 81 MG tablet Take 1 tablet (81 mg total) by mouth 2 (two) times daily for 28 days. Swallow whole. Patient taking differently: Take 81 mg by mouth in the morning. Swallow whole. 03/22/24 04/19/24 Yes Renae Bernarda HERO, PA-C  b complex vitamins capsule Take 1 capsule by mouth daily with lunch.   Yes [provider]  ceFAZolin  (ANCEF ) IVPB Inject 2 g into the vein every 8 (eight) hours. Indication:  Right Ankle Osteomyelitis First Dose: Yes Last Day of Therapy:  05/02/2024 Labs - Once weekly:  CBC/D and BMP, Labs - Once weekly: ESR and CRP Method of administration: IV Push Method of administration may be changed at the discretion of home infusion pharmacist based upon assessment of the patient and/or caregiver's ability to self-administer the medication ordered. 03/24/24 05/02/24 Yes [name], MD  Cholecalciferol (VITAMIN D -3 PO) Take 1 capsule by mouth daily with lunch.   Yes [provider]  clobetasol  cream (TEMOVATE ) 0.05 % Apply topically 2 (two) times daily. Apply to Psoriatic plaques on the extremities, trunk or scalp. DO not apply to face, intertriginous areas (armpits, groin, under pannus, gluteal cleft), or genitals. Patient taking differently: Apply 1 Application topically 2 (two) times daily as needed (to Psoriatic plaques on the extremities, trunk or scalp. DO not apply to face, intertriginous areas (armpits, groin, under pannus, gluteal cleft), or genitals.). 02/24/24  Yes Patel, Pranav M, MD  Cyanocobalamin (VITAMIN B12 PO) Take 1 tablet by mouth daily with lunch.   Yes [provider]  folic acid  (FOLVITE ) 1 MG tablet Take 1 mg by mouth daily with lunch.   Yes [provider]  furosemide  (LASIX ) 40 MG tablet Take 1 tablet (40 mg total) by mouth daily. Patient taking  differently: Take 40 mg by mouth in the morning. 02/24/24  Yes Tobie Yetta HERO, MD  lactulose (CHRONULAC) 10 GM/15ML solution Take 45 mLs (30 g total) by mouth 3 (three) times daily. Patient taking differently: Take 10-20 g by mouth in the morning. 03/13/24  Yes Dasie Faden, MD  nadolol  (CORGARD ) 20 MG tablet Take 1 tablet (20 mg total) by mouth daily. 03/11/23 04/05/24 Yes Lemmon, Delon Gibson, PA  pantoprazole  (PROTONIX ) 40 MG tablet TAKE 1 TABLET BY MOUTH TWICE A DAY Patient taking differently: Take 40 mg by mouth 2 (two) times daily before a meal. 03/24/24  Yes Berneta Elsie Sayre, MD  spironolactone  (ALDACTONE ) 25 MG tablet Take 1 tablet (25 mg total) by mouth daily. Patient taking differently: Take 25 mg by mouth in the morning. 02/25/24  Yes Tobie Yetta HERO, MD  Zinc 50 MG TABS Take 50 mg by mouth daily with lunch.   Yes [provider]  methocarbamol  (ROBAXIN ) 750 MG tablet Take 1 tablet (750 mg total) by mouth every 6 (six) hours as needed for muscle spasms. Patient not taking: Reported on 04/05/2024 03/30/24   Dibia, Landon BRAVO, MD  Secukinumab LILLIE DAN)  300 MG/2ML SOAJ Inject 300mg  [facility] the skin at [facility] 0, 1, 2, 3 Patient not taking: Reported on 04/05/2024 03/10/24   Jeannetta Lonni ORN, MD                                                                                                                                    Past Surgical History Past Surgical History:  Procedure Laterality Date   ESOPHAGEAL BANDING  12/26/2022   Procedure: ESOPHAGEAL BANDING;  Surgeon: Shila Gustav GAILS, MD;  Location: [facility];  Service: Gastroenterology;;   ESOPHAGOGASTRODUODENOSCOPY (EGD) WITH PROPOFOL  N/A 07/05/2022   Procedure: ESOPHAGOGASTRODUODENOSCOPY (EGD) WITH PROPOFOL ;  Surgeon: Shila Gustav GAILS, MD;  Location: [facility];  Service: Gastroenterology;  Laterality: N/A;   ESOPHAGOGASTRODUODENOSCOPY (EGD) WITH PROPOFOL  N/A 12/26/2022   Procedure:  ESOPHAGOGASTRODUODENOSCOPY (EGD) WITH PROPOFOL ;  Surgeon: Shila Gustav GAILS, MD;  Location: [facility];  Service: Gastroenterology;  Laterality: N/A;   ESOPHAGOGASTRODUODENOSCOPY (EGD) WITH PROPOFOL  N/A 02/12/2023   Procedure: ESOPHAGOGASTRODUODENOSCOPY (EGD) WITH PROPOFOL ;  Surgeon: Shila Gustav GAILS, MD;  Location: [facility];  Service: Gastroenterology;  Laterality: N/A;   INCISION AND DRAINAGE OF DEEP ABSCESS, ANKLE Right 03/21/2024   Procedure: INCISION AND DRAINAGE OF DEEP ABSCESS, ANKLE;  Surgeon: Edna Toribio LABOR, MD;  Location: [facility] ORS;  Service: Orthopedics;  Laterality: Right;   IR THORACENTESIS ASP PLEURAL SPACE W/IMG GUIDE  04/05/2024   ORIF ANKLE FRACTURE Right 12/28/2022   Procedure: OPEN REDUCTION INTERNAL FIXATION (ORIF) ANKLE FRACTURE;  Surgeon: Josefina Chew, MD;  Location: [facility] OR;  Service: Orthopedics;  Laterality: Right;   Family History Family History  Problem Relation Age of Onset   Liver disease Mother    Depression Mother    Liver disease Father    Diabetes Father    Hypertension Father    Liver disease Sister    Diabetes Other    Colon cancer Neg Hx    Esophageal cancer Neg Hx    Stomach cancer Neg Hx    Colon polyps Neg Hx     Social History Social History   Tobacco Use   Smoking status: Some Days    Types: Cigars    Passive exposure: Past   Smokeless tobacco: Never   Tobacco comments:    Cigars occasional  Vaping Use   Vaping status: Never Used  Substance Use Topics   Alcohol use: Not Currently    Comment: Sober since November 2024   Drug use: Never    Comment: history of cocaine abuse   Allergies Oxycodone , Codeine, and Other  Review of Systems A thorough review of systems was obtained and all systems are negative except as noted in the HPI and PMH.   Physical Exam Vital Signs  I have reviewed the triage vital signs BP (!) 102/50 (BP Location: Left Arm)   Pulse 63   Temp 98.3 F (36.8 C)   Resp 19   Ht 5' 5 (1.651 m)  Wt 117.1 kg   SpO2 92%   BMI 42.97 kg/m  Physical Exam Vitals and nursing note reviewed.  Constitutional:      General: He is not in acute distress.    Appearance: Normal appearance. He is well-developed. He is not ill-appearing.  HENT:     Head: Normocephalic and atraumatic.     Right Ear: External ear normal.     Left Ear: External ear normal.     Nose: Nose normal.     Mouth/Throat:     Mouth: Mucous membranes are moist.  Eyes:     General: Scleral icterus present.        Right eye: No discharge.        Left eye: No discharge.  Cardiovascular:     Rate and Rhythm: Normal rate.  Pulmonary:     Effort: Pulmonary effort is normal. Tachypnea present. No respiratory distress.     Breath sounds: No stridor. Decreased breath sounds present.     Comments: Breath sounds diminished on the right Abdominal:     General: Abdomen is flat. There is no distension.     Tenderness: There is no guarding.  Musculoskeletal:        General: No deformity.     Cervical back: No rigidity.     Right lower leg: Edema present.     Left lower leg: Edema present.     Comments: Cast RLE Cap refill brisk b/l toes Sensation intact b/l toes  Skin:    General: Skin is warm and dry.     Coloration: Skin is jaundiced. Skin is not cyanotic or pale.  Neurological:     Mental Status: He is alert.  Psychiatric:        Speech: Speech normal.        Behavior: Behavior normal. Behavior is cooperative.     ED Results and Treatments Labs (all labs ordered are listed, but only abnormal results are displayed) Labs Reviewed  BASIC METABOLIC PANEL WITH GFR - Abnormal; Notable for the following components:      Result Value   BUN 28 (*)    Calcium 8.6 (*)    Anion gap 4 (*)    All other components within normal limits  CBC WITH DIFFERENTIAL/PLATELET - Abnormal; Notable for the following components:   RBC 3.22 (*)    Hemoglobin 10.4 (*)    HCT 33.3 (*)    MCV 103.4 (*)    RDW 15.7 (*)    Platelets  110 (*)    All other components within normal limits  PROTIME-INR - Abnormal; Notable for the following components:   Prothrombin Time 19.5 (*)    INR 1.6 (*)    All other components within normal limits  AMMONIA - Abnormal; Notable for the following components:   Ammonia 42 (*)    All other components within normal limits  HEPATIC FUNCTION PANEL - Abnormal; Notable for the following components:   Total Protein 6.4 (*)    Albumin  2.4 (*)    AST 92 (*)    Alkaline Phosphatase 235 (*)    Total Bilirubin 3.7 (*)    Bilirubin, Direct 1.8 (*)    Indirect Bilirubin 2.0 (*)    All other components within normal limits  LACTATE DEHYDROGENASE, PLEURAL OR PERITONEAL FLUID - Abnormal; Notable for the following components:   LD, Fluid 33 (*)    All other components within normal limits  COMPREHENSIVE METABOLIC PANEL WITH GFR - Abnormal; Notable for the following  components:   BUN 26 (*)    Calcium 8.5 (*)    Total Protein 6.0 (*)    Albumin  2.4 (*)    AST 86 (*)    Alkaline Phosphatase 218 (*)    Total Bilirubin 3.2 (*)    All other components within normal limits  COMPREHENSIVE METABOLIC PANEL WITH GFR - Abnormal; Notable for the following components:   BUN 25 (*)    Calcium 8.2 (*)    Total Protein 5.6 (*)    Albumin  2.1 (*)    AST 86 (*)    Alkaline Phosphatase 207 (*)    Total Bilirubin 3.0 (*)    Anion gap 3 (*)    All other components within normal limits  CBC - Abnormal; Notable for the following components:   RBC 2.87 (*)    Hemoglobin 9.1 (*)    HCT 29.6 (*)    MCV 103.1 (*)    RDW 15.7 (*)    Platelets 108 (*)    All other components within normal limits  CBC WITH DIFFERENTIAL/PLATELET - Abnormal; Notable for the following components:   RBC 3.01 (*)    Hemoglobin 9.7 (*)    HCT 31.0 (*)    MCV 103.0 (*)    RDW 15.6 (*)    Platelets 103 (*)    All other components within normal limits  RESP PANEL BY RT-PCR (RSV, FLU A&B, COVID)  RVPGX2  BODY FLUID CULTURE W GRAM  STAIN  PRO BRAIN NATRIURETIC PEPTIDE  ALBUMIN , PLEURAL OR PERITONEAL FLUID   MAGNESIUM  CYTOLOGY - NON PAP  TROPONIN T, HIGH SENSITIVITY  TROPONIN T, HIGH SENSITIVITY                                                                                                                          Radiology IR THORACENTESIS ASP PLEURAL SPACE W/IMG GUIDE Result Date: 04/05/2024 INDICATION: Patient with recurrent right pleural effusion. Underwent thoracentesis 03/20/24 (2.2L) and 02/21/24 (650 mL). He is currently inpatient, presenting with [facility]. History of cirrhosis. EXAM: ULTRASOUND GUIDED DIAGNOSTIC AND THERAPEUTIC RIGHT THORACENTESIS MEDICATIONS: 10 mL 1% lidocaine  with epi COMPLICATIONS: None immediate. PROCEDURE: An ultrasound guided thoracentesis was thoroughly discussed with the patient and questions answered. The benefits, risks, alternatives and complications were also discussed. The patient understands and wishes to proceed with the procedure. Written consent was obtained. Ultrasound was performed to localize and mark an adequate pocket of fluid in the right chest. The area was then prepped and draped in the normal sterile fashion. 1% Lidocaine  was used for local anesthesia. Under ultrasound guidance a 6 Fr Safe-T-Centesis catheter was introduced. Thoracentesis was performed. The catheter was removed and a dressing applied. CXR ordered. FINDINGS: A total of approximately 1.75 liters of hazy, yellow fluid was removed. Samples were sent to the laboratory as requested by the clinical team. IMPRESSION: Successful ultrasound guided right thoracentesis yielding 1.75 liters of pleural fluid. The patient has required >/=2 thoracenteses (for hepatic hydrothorax) in  a 30 day period and a formal evaluation by the [facility] has been arranged per 03/20/24 procedure note by Dr. Hughes. Performed and dictated by Laymon Coast, NP under the supervision of Dr. Philip.  Electronically Signed   By: Juliene Philip M.D.   On: 04/05/2024 18:22   DG Chest 1 View Result Date: 04/05/2024 EXAM: 1 VIEW(S) XRAY OF THE CHEST 04/05/2024 05:21:03 PM COMPARISON: 10:28 04/05/2024, 03/22/2024 CLINICAL HISTORY: [id number] Status post thoracentesis [id number]. Status post thoracentesis ; Right side 1.7 liters Status post thoracentesis ; Right side 1.7 liters FINDINGS: LINES, TUBES AND DEVICES: Right upper extremity central venous catheter tip overlies the SVC. LUNGS AND PLEURA: Persistent airspace disease at the right base. Decreased right-sided pleural effusion with moderate residual, which may be loculated. No pulmonary edema. No definitive right pneumothorax. HEART AND MEDIASTINUM: Stable cardiomegaly and mild central congestion. BONES AND SOFT TISSUES: No acute osseous abnormality. IMPRESSION: 1. Decreased right-sided pleural effusion with moderate residual, which may be loculated. No visible right pneumothorax 2. Persistent right basilar airspace disease. 3. Stable cardiomegaly with mild central congestion. Electronically signed by: Luke Bun MD 04/05/2024 05:30 PM EDT RP Workstation: [id number]   DG Chest 2 View Result Date: 04/05/2024 EXAM: 2 VIEW(S) XRAY OF THE CHEST 04/05/2024 01:09:00 PM COMPARISON: Comparison 03/22/2024. CLINICAL HISTORY: dib, pleural eff. dib, pleural eff; Pt states he has pleural effusion in right lung, symptoms include SOB. FINDINGS: LINES, TUBES AND DEVICES: Right-sided PICC line is again noted. LUNGS AND PLEURA: Large right pleural effusion is noted which is increased in size compared to prior exam. Small amount of aerated right upper lobe is noted. No pulmonary edema. No pneumothorax. HEART AND MEDIASTINUM: No acute abnormality of the cardiac and mediastinal silhouettes. BONES AND SOFT TISSUES: No acute osseous abnormality. IMPRESSION: 1. Large right pleural effusion, increased in size compared to the prior exam. Electronically signed by: Lynwood Seip MD 04/05/2024  01:32 PM EDT RP Workstation: [id number]    Pertinent labs & imaging results that were available during my care of the patient were reviewed by me and considered in my medical decision making (see MDM for details).  Medications Ordered in ED Medications  ondansetron  (ZOFRAN ) tablet 4 mg (has no administration in time range)    Or  ondansetron  (ZOFRAN ) injection 4 mg (has no administration in time range)  ceFAZolin  (ANCEF ) IVPB 2g/100 mL premix (2 g Intravenous New Bag/Given 04/06/24 0527)  furosemide  (LASIX ) injection 20 mg (has no administration in time range)  albuterol  (PROVENTIL ) (2.5 MG/3ML) 0.083% nebulizer solution 2.5 mg (has no administration in time range)  artificial tears ophthalmic solution 1 drop (has no administration in time range)  Chlorhexidine  Gluconate Cloth 2 % PADS 6 each (6 each Topical Given 04/05/24 1931)  loteprednol (LOTEMAX) 0.5 % ophthalmic suspension 1 drop (1 drop Both Eyes Patient Refused/Not Given 04/05/24 2112)  pantoprazole  (PROTONIX ) EC tablet 40 mg (40 mg Oral Given 04/05/24 2112)  oxyCODONE  (Oxy IR/ROXICODONE ) immediate release tablet 5 mg (has no administration in time range)  nadolol  (CORGARD ) tablet 20 mg (20 mg Oral Given 04/05/24 2111)  sodium chloride  flush (NS) 0.9 % injection 10-40 mL (10 mLs Intracatheter Given 04/05/24 2114)  sodium chloride  flush (NS) 0.9 % injection 10-40 mL (has no administration in time range)  clobetasol  cream (TEMOVATE ) 0.05 % ( Topical Given 04/05/24 2112)  folic acid  (FOLVITE ) tablet 1 mg (has no administration in time range)  acetaminophen  (TYLENOL ) tablet 325-650 mg (has no administration in time range)  traZODone  (  DESYREL ) tablet 25-50 mg (has no administration in time range)  diphenhydrAMINE  (BENADRYL ) capsule 25 mg (has no administration in time range)  bisacodyl  (DULCOLAX) suppository 10 mg (has no administration in time range)  sodium phosphate  (FLEET) enema 1 enema (has no administration in time range)   prochlorperazine (COMPAZINE) tablet 5-10 mg (has no administration in time range)    Or  prochlorperazine (COMPAZINE) suppository 12.5 mg (has no administration in time range)    Or  prochlorperazine (COMPAZINE) injection 5-10 mg (has no administration in time range)  alum & mag hydroxide-simeth (MAALOX/MYLANTA) 200-200-20 MG/5ML suspension 30 mL (has no administration in time range)  guaiFENesin -dextromethorphan (ROBITUSSIN DM) 100-10 MG/5ML syrup 5-10 mL (has no administration in time range)  enoxaparin  (LOVENOX ) injection 40 mg (has no administration in time range)  influenza vac split trivalent PF (FLUZONE) injection 0.5 mL (has no administration in time range)  furosemide  (LASIX ) injection 20 mg (20 mg Intravenous Given 04/05/24 1514)  lactulose (CHRONULAC) 10 GM/15ML solution 10 g (10 g Oral Given 04/05/24 1554)  methocarbamol  (ROBAXIN ) tablet 500 mg (500 mg Oral Given 04/06/24 0325)                                                                                                                                     Procedures Procedures  (including critical care time)  Medical Decision Making / ED Course    Medical Decision Making:    KLYE BESECKER is a 48 y.o. male with past medical history as below, significant for alcoholic cirrhosis, sleep apnea, obesity, IDA, hydrothorax who presents to the ED with complaint of dib. The complaint involves an extensive differential diagnosis and also carries with it a high risk of complications and morbidity.  Serious etiology was considered. Ddx includes but is not limited to: In my evaluation of this patient's dyspnea my DDx includes, but is not limited to, pneumonia, pulmonary embolism, pneumothorax, pulmonary edema, metabolic acidosis, asthma, COPD, cardiac cause, anemia, anxiety, etc.    Complete initial physical exam performed, notably the patient was in no acute distress.    Reviewed and confirmed nursing documentation for past medical  history, family history, social history.  Vital signs reviewed.    Pleural effusion (recurrent, presumed hydrothorax 2/2 cirrhosis)  Exertional dyspnea Cirrhosis > - Patient known history of cirrhosis, recurrent right-sided pleural effusion.  Here with exertional dyspnea, worsening jaundice - Ammonia is mildly elevated, he is no longer on lactulose.  Ordered lactulose here. He is not acutely encephalopathic  - Pleural  effusion noted on imaging.  Will consult interventional radiology for evaluation of thoracentesis.  Start Lasix  - IR will see pt this evening / tomorrow am for eval of thora - plan admission   Clinical Course as of 04/06/24 0719  Tue Apr 05, 2024  1549 Spoke with Dr Philip with IR, will see in consult  [SG]    Clinical Course User Index [SG] Elnor,  Jayson LABOR, DO     Admit TRH                Additional history obtained: -Additional history obtained from na -External records from outside source obtained and reviewed including: Chart review including previous notes, labs, imaging, consultation notes including  Home meds Recent admit   Lab Tests: -I ordered, reviewed, and interpreted labs.   The pertinent results include:   Labs Reviewed  BASIC METABOLIC PANEL WITH GFR - Abnormal; Notable for the following components:      Result Value   BUN 28 (*)    Calcium 8.6 (*)    Anion gap 4 (*)    All other components within normal limits  CBC WITH DIFFERENTIAL/PLATELET - Abnormal; Notable for the following components:   RBC 3.22 (*)    Hemoglobin 10.4 (*)    HCT 33.3 (*)    MCV 103.4 (*)    RDW 15.7 (*)    Platelets 110 (*)    All other components within normal limits  PROTIME-INR - Abnormal; Notable for the following components:   Prothrombin Time 19.5 (*)    INR 1.6 (*)    All other components within normal limits  AMMONIA - Abnormal; Notable for the following components:   Ammonia 42 (*)    All other components within normal limits  HEPATIC  FUNCTION PANEL - Abnormal; Notable for the following components:   Total Protein 6.4 (*)    Albumin  2.4 (*)    AST 92 (*)    Alkaline Phosphatase 235 (*)    Total Bilirubin 3.7 (*)    Bilirubin, Direct 1.8 (*)    Indirect Bilirubin 2.0 (*)    All other components within normal limits  LACTATE DEHYDROGENASE, PLEURAL OR PERITONEAL FLUID - Abnormal; Notable for the following components:   LD, Fluid 33 (*)    All other components within normal limits  COMPREHENSIVE METABOLIC PANEL WITH GFR - Abnormal; Notable for the following components:   BUN 26 (*)    Calcium 8.5 (*)    Total Protein 6.0 (*)    Albumin  2.4 (*)    AST 86 (*)    Alkaline Phosphatase 218 (*)    Total Bilirubin 3.2 (*)    All other components within normal limits  COMPREHENSIVE METABOLIC PANEL WITH GFR - Abnormal; Notable for the following components:   BUN 25 (*)    Calcium 8.2 (*)    Total Protein 5.6 (*)    Albumin  2.1 (*)    AST 86 (*)    Alkaline Phosphatase 207 (*)    Total Bilirubin 3.0 (*)    Anion gap 3 (*)    All other components within normal limits  CBC - Abnormal; Notable for the following components:   RBC 2.87 (*)    Hemoglobin 9.1 (*)    HCT 29.6 (*)    MCV 103.1 (*)    RDW 15.7 (*)    Platelets 108 (*)    All other components within normal limits  CBC WITH DIFFERENTIAL/PLATELET - Abnormal; Notable for the following components:   RBC 3.01 (*)    Hemoglobin 9.7 (*)    HCT 31.0 (*)    MCV 103.0 (*)    RDW 15.6 (*)    Platelets 103 (*)    All other components within normal limits  RESP PANEL BY RT-PCR (RSV, FLU A&B, COVID)  [id number]  BODY FLUID CULTURE W GRAM STAIN  PRO BRAIN NATRIURETIC PEPTIDE  ALBUMIN , PLEURAL  OR PERITONEAL FLUID   MAGNESIUM  CYTOLOGY - NON PAP  TROPONIN T, HIGH SENSITIVITY  TROPONIN T, HIGH SENSITIVITY    Notable for ammonia +  EKG   EKG Interpretation Date/Time:    Ventricular Rate:    PR Interval:    QRS Duration:    QT Interval:    QTC Calculation:   R  Axis:      Text Interpretation:           Imaging Studies ordered: I ordered imaging studies including CXR I independently visualized the following imaging with scope of interpretation limited to determining acute life threatening conditions related to emergency care; findings noted above I agree with the radiologist interpretation If any imaging was obtained with contrast I closely monitored patient for any possible adverse reaction a/w contrast administration in the emergency department   Medicines ordered and prescription drug management: Meds ordered this encounter  Medications   furosemide  (LASIX ) injection 20 mg   lactulose (CHRONULAC) 10 GM/15ML solution 10 g   OR Linked Order Group    ondansetron  (ZOFRAN ) tablet 4 mg    ondansetron  (ZOFRAN ) injection 4 mg   ceFAZolin  (ANCEF ) IVPB 2g/100 mL premix    Indication:  Right Ankle Osteomyelitis First Dose: Yes Last Day of Therapy:  05/02/2024 Labs - Once weekly:  CBC/D and BMP, Labs - Once weekly: ESR and CRP Method of administration: IV Push   furosemide  (LASIX ) injection 20 mg   albuterol  (PROVENTIL ) (2.5 MG/3ML) 0.083% nebulizer solution 2.5 mg   artificial tears ophthalmic solution 1 drop   DISCONTD: ceFAZolin  (ANCEF ) IVPB 2g/100 mL premix    Antibiotic Indication::   Cellulitis   Chlorhexidine  Gluconate Cloth 2 % PADS 6 each   loteprednol (LOTEMAX) 0.5 % ophthalmic suspension 1 drop   pantoprazole  (PROTONIX ) EC tablet 40 mg   oxyCODONE  (Oxy IR/ROXICODONE ) immediate release tablet 5 mg   nadolol  (CORGARD ) tablet 20 mg   sodium chloride  flush (NS) 0.9 % injection 10-40 mL   sodium chloride  flush (NS) 0.9 % injection 10-40 mL   clobetasol  cream (TEMOVATE ) 0.05 %   folic acid  (FOLVITE ) tablet 1 mg   acetaminophen  (TYLENOL ) tablet 325-650 mg   traZODone  (DESYREL ) tablet 25-50 mg   diphenhydrAMINE  (BENADRYL ) capsule 25 mg   bisacodyl  (DULCOLAX) suppository 10 mg   sodium phosphate  (FLEET) enema 1 enema   OR Linked  Order Group    prochlorperazine (COMPAZINE) tablet 5-10 mg    prochlorperazine (COMPAZINE) suppository 12.5 mg    prochlorperazine (COMPAZINE) injection 5-10 mg   alum & mag hydroxide-simeth (MAALOX/MYLANTA) 200-200-20 MG/5ML suspension 30 mL   guaiFENesin -dextromethorphan (ROBITUSSIN DM) 100-10 MG/5ML syrup 5-10 mL   enoxaparin  (LOVENOX ) injection 40 mg   influenza vac split trivalent PF (FLUZONE) injection 0.5 mL   methocarbamol  (ROBAXIN ) tablet 500 mg    -I have reviewed the patients home medicines and have made adjustments as needed   Consultations Obtained: I requested consultation with the IR, TRH,  and discussed lab and imaging findings as well as pertinent plan   Cardiac Monitoring: The patient was maintained on a cardiac monitor.  I personally viewed and interpreted the cardiac monitored which showed an underlying rhythm of: nsr Continuous pulse oximetry interpreted by myself, 95% on 2LNC.    Social Determinants of Health:  Diagnosis or treatment significantly limited by social determinants of health: obesity, former etoh   Reevaluation: After the interventions noted above, I reevaluated the patient and found that they have improved  Co morbidities that complicate  the patient evaluation  Past Medical History:  Diagnosis Date   Acute conjunctivitis of both eyes 10/22/2021   Acute sinusitis 06/16/2013   Alcoholic cirrhosis (HCC)    Alcoholic fatty liver 01/16/2010   Needs final HBV and HAV vaccines on or after 10/25/2012    Alcoholism (HCC) 12/25/2011   Allergic rhinitis    Childhood asthma    Elevated transaminase level 06/10/2007   AST: 80 ALT: 136 in 8/11: Hepatitis A., B and C negative.    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    Sleep apnea        Dispostion: Disposition decision including need for hospitalization was considered, and patient admitted to the hospital.    Final Clinical Impression(s) / ED Diagnoses Final diagnoses:  Dyspnea, unspecified type  Pleural effusion  Hyperammonemia        Elnor Jayson LABOR, DO 04/06/24 0719

## 2024-04-06 ENCOUNTER — Telehealth: Payer: Self-pay

## 2024-04-06 DIAGNOSIS — J90 Pleural effusion, not elsewhere classified: Secondary | ICD-10-CM | POA: Diagnosis not present

## 2024-04-06 LAB — COMPREHENSIVE METABOLIC PANEL WITH GFR
ALT: 16 U/L (ref 0–44)
AST: 86 U/L — ABNORMAL HIGH (ref 15–41)
Albumin: 2.1 g/dL — ABNORMAL LOW (ref 3.5–5.0)
Alkaline Phosphatase: 207 U/L — ABNORMAL HIGH (ref 38–126)
Anion gap: 3 — ABNORMAL LOW (ref 5–15)
BUN: 25 mg/dL — ABNORMAL HIGH (ref 6–20)
CO2: 32 mmol/L (ref 22–32)
Calcium: 8.2 mg/dL — ABNORMAL LOW (ref 8.9–10.3)
Chloride: 103 mmol/L (ref 98–111)
Creatinine, Ser: 0.86 mg/dL (ref 0.61–1.24)
GFR, Estimated: >60 mL/min (ref >60)
Glucose, Bld: 91 mg/dL (ref 70–99)
Potassium: 3.7 mmol/L (ref 3.5–5.1)
Sodium: 138 mmol/L (ref 135–145)
Total Bilirubin: 3 mg/dL — ABNORMAL HIGH (ref 0.0–1.2)
Total Protein: 5.6 g/dL — ABNORMAL LOW (ref 6.5–8.1)

## 2024-04-06 LAB — MAGNESIUM: Magnesium: 1.7 mg/dL (ref 1.7–2.4)

## 2024-04-06 LAB — CBC
HCT: 29.6 % — ABNORMAL LOW (ref 39.0–52.0)
Hemoglobin: 9.1 g/dL — ABNORMAL LOW (ref 13.0–17.0)
MCH: 31.7 pg (ref 26.0–34.0)
MCHC: 30.7 g/dL (ref 30.0–36.0)
MCV: 103.1 fL — ABNORMAL HIGH (ref 80.0–100.0)
Platelets: 108 K/uL — ABNORMAL LOW (ref 150–400)
RBC: 2.87 MIL/uL — ABNORMAL LOW (ref 4.22–5.81)
RDW: 15.7 % — ABNORMAL HIGH (ref 11.5–15.5)
WBC: 5.9 K/uL (ref 4.0–10.5)
nRBC: 0 % (ref 0.0–0.2)

## 2024-04-06 MED ORDER — METHOCARBAMOL 500 MG PO TABS
500.0000 mg | ORAL_TABLET | Freq: Four times a day (QID) | ORAL | Status: DC | PRN
Start: 2024-04-06 — End: 2024-04-13
  Administered 2024-04-06 – 2024-04-12 (×8): 500 mg via ORAL
  Filled 2024-04-06 (×8): qty 1

## 2024-04-06 MED ORDER — LACTULOSE 10 GM/15ML PO SOLN
30.0000 g | Freq: Three times a day (TID) | ORAL | Status: DC
  Administered 2024-04-06 – 2024-04-07 (×2): 30 g via ORAL
  Filled 2024-04-06 (×3): qty 45

## 2024-04-06 MED ORDER — FUROSEMIDE 10 MG/ML IJ SOLN
40.0000 mg | Freq: Two times a day (BID) | INTRAMUSCULAR | Status: DC
  Administered 2024-04-06 – 2024-04-08 (×5): 40 mg via INTRAVENOUS
  Filled 2024-04-06 (×5): qty 4

## 2024-04-06 MED ORDER — SPIRONOLACTONE 25 MG PO TABS
25.0000 mg | ORAL_TABLET | Freq: Every day | ORAL | Status: DC
  Administered 2024-04-06 – 2024-04-10 (×5): 25 mg via ORAL
  Filled 2024-04-06 (×5): qty 1

## 2024-04-06 MED ORDER — METHOCARBAMOL 500 MG PO TABS
500.0000 mg | ORAL_TABLET | Freq: Once | ORAL | Status: AC
  Administered 2024-04-06: 500 mg via ORAL
  Filled 2024-04-06: qty 1

## 2024-04-06 NOTE — Evaluation (Signed)
 Occupational Therapy Evaluation Patient Details Name: Thomas [name] MRN: [id number] DOB: August 04, 1975 Today's Date: 04/06/2024   History of Present Illness   Patient is a 48 year old male admitted with SOB, LLE edema, Dx of pleural effusion and s/p thoracentesis (1.75 L).  PMHx includes psoriatic arthritis, psoriasis, ETOH cirrhosis, and obesity was just discharged 6 days ago.  The patient was hospitalized from October 12 through October 22.  He had infection of the hardware in his right ankle and is status post removal of the hardware and is on outpatient IV antibiotics through PICC line. Also admitted 9/13-9/17 with a pleural effusion.     Clinical Impressions PTA, patient was recovering s/p removal of infected hardware in right ankle with NWB precautions which remain in effect.  Acute onset of pleural effusion has impacted patient's activity tolerance and UB strength, affecting safety and independence with transfers and other functional mobility tasks.  Bilateral shoulder strength is grossly 3+/5 which impairs toilet transfers and ambulation using RW.  Patient will benefit from continued inpatient follow up therapy, < 3 hours/day.  Acute OT will continue to follow patient while in the hospital to address UB strengthening and other performance deficits described herein.  Thank you for allowing us  to participate in the care of this patient.     If plan is discharge home, recommend the following:   A little help with bathing/dressing/bathroom;A little help with walking and/or transfers;Help with stairs or ramp for entrance     Functional Status Assessment   Patient has had a recent decline in their functional status and demonstrates the ability to make significant improvements in function in a reasonable and predictable amount of time.     Equipment Recommendations   None recommended by OT      Precautions/Restrictions   Precautions Precautions: Fall Recall of  Precautions/Restrictions: Intact Required Braces or Orthoses: Splint/Cast Splint/Cast: Right ankle cast Restrictions Weight Bearing Restrictions Per Provider Order: Yes RLE Weight Bearing Per Provider Order: Non weight bearing     Mobility Bed Mobility Overal bed mobility: Modified Independent    Transfers Overall transfer level: Needs assistance Equipment used: None Transfers: Bed to chair/wheelchair/BSC Stand pivot transfers: Contact guard assist   General transfer comment: Patient verbalized concern about impact of bilateral shoulder weakness on ambulation and transfers using RW.      Balance Overall balance assessment: Needs assistance Sitting-balance support: No upper extremity supported, Feet unsupported Sitting balance-Leahy Scale: Good   Standing balance support: Bilateral upper extremity supported Standing balance-Leahy Scale: Fair     ADL either performed or assessed with clinical judgement   ADL Overall ADL's : Needs assistance/impaired Eating/Feeding: Modified independent;Sitting   Grooming: Wash/dry hands;Wash/dry face;Oral care;Sitting;Modified independent   Upper Body Bathing: Contact guard assist;Sitting   Lower Body Bathing: Minimal assistance;Sitting/lateral leans   Upper Body Dressing : Set up;Sitting   Lower Body Dressing: Sitting/lateral leans;Moderate assistance   Toilet Transfer: Contact guard assist;BSC/3in1;Stand-pivot   Toileting- Clothing Manipulation and Hygiene: Minimal assistance;Sitting/lateral lean   Functional mobility during ADLs: Contact guard assist;Rolling walker (2 wheels) General ADL Comments: Provided education in placement of 3 in 1 over std toilet to provide push bars for stand / pivot transfers; patient verbalized understanding.     Vision Baseline Vision/History: 0 No visual deficits Ability to See in Adequate Light: 0 Adequate Patient Visual Report: No change from baseline Vision Assessment?: No apparent visual  deficits            Pertinent Vitals/Pain Pain Assessment Pain  Assessment: 0-10 Pain Score: 7  Pain Location: Right ankle Pain Descriptors / Indicators: Throbbing Pain Intervention(s): Monitored during session, Repositioned     Extremity/Trunk Assessment Upper Extremity Assessment Upper Extremity Assessment: Generalized weakness (Shoulder flexion 3+/5)   Lower Extremity Assessment Lower Extremity Assessment: Defer to PT evaluation RLE Deficits / Details: ankle in splint, knee ext at least 3/5, sensation intact at toes, can wiggle toes, can march in seated position RLE: Unable to fully assess due to immobilization RLE Sensation: WNL RLE Coordination: WNL   Cervical / Trunk Assessment Cervical / Trunk Assessment: Normal Cervical / Trunk Exceptions: Body Habitus   Communication Communication Communication: No apparent difficulties   Cognition Arousal: Alert Behavior During Therapy: WFL for tasks assessed/performed Cognition: No apparent impairments   Following commands: Intact       General Comments   Patient's ADL performance grossly at level of previous discharge (s/p R ankle hardware removal), with strength & activity tolerance decreased from baseline.           Home Living Family/patient expects to be discharged to:: Private residence Living Arrangements: Parent (Patient's father is in hospice care and not currently in the home) Available Help at Discharge: Family;Available PRN/intermittently Type of Home: House Home Access: Ramped entrance Entrance Stairs-Number of Steps: 3 Entrance Stairs-Rails: None (pt reports having ramp built) Home Layout: Two level;Bed/bath upstairs;1/2 bath on main level;Other (Comment) (Half bath on ground floor is WC accessible; patient has recently acquired hospital bed) Alternate Level Stairs-Number of Steps: Flight Alternate Level Stairs-Rails: Right Bathroom Shower/Tub: Producer, Television/film/video: Standard Bathroom  Accessibility: Yes How Accessible: Accessible via wheelchair Home Equipment: Cane - single point;Tub bench;Rolling Walker (2 wheels);Wheelchair - manual;Hospital bed;BSC/3in1   Additional Comments: Patient verbalized concern about safety & ability to control WC while descending ramp      Prior Functioning/Environment Prior Level of Function : Independent/Modified Independent;Driving (Patient between jobs but anticipates returning to work)   Mobility Comments: Independent without AD prior to hardware removal ADLs Comments: Independent with ADL and IADL activities    OT Problem List: Decreased strength;Decreased activity tolerance;Impaired balance (sitting and/or standing);Obesity   OT Treatment/Interventions: Self-care/ADL training;Therapeutic exercise;Energy conservation;DME and/or AE instruction;Therapeutic activities;Patient/family education      OT Goals(Current goals can be found in the care plan section)   Acute Rehab OT Goals Patient Stated Goal: To return home and complete IV Abx OT Goal Formulation: With patient Time For Goal Achievement: 04/21/24 Potential to Achieve Goals: Good ADL Goals Pt Will Perform Lower Body Dressing: with adaptive equipment;with set-up (while demonstrating understanding of compensatory strategies.) Pt Will Transfer to Toilet: with modified independence;stand pivot transfer;bedside commode;grab bars Pt Will Perform Toileting - Clothing Manipulation and hygiene: with modified independence;sitting/lateral leans Pt/caregiver will Perform Home Exercise Program: Increased strength;Both right and left upper extremity;With theraband;Independently;With written HEP provided   OT Frequency:  Min 2X/week       AM-PAC OT 6 Clicks Daily Activity     Outcome Measure Help from another person eating meals?: None Help from another person taking care of personal grooming?: None Help from another person toileting, which includes using toliet, bedpan, or  urinal?: A Little Help from another person bathing (including washing, rinsing, drying)?: A Little Help from another person to put on and taking off regular upper body clothing?: A Little Help from another person to put on and taking off regular lower body clothing?: A Lot 6 Click Score: 19   End of Session Equipment Utilized During Treatment: Gait belt  Nurse Communication: Mobility status  Activity Tolerance: Patient tolerated treatment well Patient left: in chair;with call bell/phone within reach  OT Visit Diagnosis: Muscle weakness (generalized) (M62.81);Unsteadiness on feet (R26.81) Pain - Right/Left: Right Pain - part of body: Ankle and joints of foot                Time: 1006-1046 OT Time Calculation (min): 40 min Charges:  OT General Charges $OT Visit: 1 Visit OT Evaluation $OT Eval Moderate Complexity: 1 Mod OT Treatments $Self Care/Home Management : 8-22 mins $Therapeutic Activity: 8-22 mins  Topanga Alvelo B. Wayman Hoard, MS, OTR/L 04/06/2024, 12:15 PM

## 2024-04-06 NOTE — TOC Progression Note (Signed)
 Transition of Care [facility]) - Progression Note    Patient Details  Name: Thomas [name] MRN: [id number] Date of Birth: 1976/03/27  [facility]) CM/SW Contact  Heather DELENA Saltness, LCSW Phone Number: 04/06/2024, 3:34 PM  Clinical Narrative:    CSW met with pt at bedside to discuss PT's recommendation for short-term SNF rehab. Pt reports he's feeling bad because his father is actively dying at [facility] in [facility]. Pt reports he was receiving HH RN through [facility] for IV antibiotics. Pt agreeable to SNF rehab. Pt reports referrals be limited to [place], [place], and [place] to be closer to family. FL2 completed and referrals sent to facilities in [place] and surrounding areas. Currently awaiting bed offers. TOC will continue to follow.   Expected Discharge Plan: Home w Home Health Services Barriers to Discharge: Continued Medical Work up  Expected Discharge Plan and Services In-house Referral: Clinical Social Work Discharge Planning Services: NA Post Acute Care Choice: Home Health Living arrangements for the past 2 months: Single Family Home                 DME Arranged: N/A DME Agency: NA       HH Arranged: PT, OT, RN HH Agency: [facility] Date HH Agency Contacted: 04/06/24 Time HH Agency Contacted: 1145 Representative spoke with at [facility] Agency: Cindie at [facility] / Pam at [facility]   Social Drivers of Health (SDOH) Interventions SDOH Screenings   Food Insecurity: No Food Insecurity (04/05/2024)  Housing: Low Risk  (04/05/2024)  Transportation Needs: No Transportation Needs (04/05/2024)  Utilities: Not At Risk (04/05/2024)  Depression (PHQ2-9): Low Risk  (03/31/2024)  Financial Resource Strain: Low Risk  (11/26/2023)  Physical Activity: Insufficiently Active (11/26/2023)  Social Connections: Socially Isolated (11/26/2023)  Stress: No Stress Concern Present (11/26/2023)  Tobacco Use: High Risk (04/05/2024)    Readmission Risk  Interventions    04/06/2024   11:43 AM 03/23/2024    3:12 PM  Readmission Risk Prevention Plan  Transportation Screening Complete Complete  PCP or Specialist Appt within 5-7 Days  Complete  Home Care Screening  Complete  Medication Review (RN CM)  Complete  Medication Review (RN Care Manager) Complete   PCP or Specialist appointment within 3-5 days of discharge Complete   HRI or Home Care Consult Complete   SW Recovery Care/Counseling Consult Complete   Palliative Care Screening Not Applicable   Skilled Nursing Facility Not Applicable     Signed: Heather Saltness, MSW, LCSW Clinical Social Worker Inpatient Care Management 04/06/2024 3:38 PM

## 2024-04-06 NOTE — Progress Notes (Signed)
 PROGRESS NOTE  Thomas [name]  FMW:[id number] DOB: 13-May-1976 DOA: 04/05/2024 PCP: Berneta Elsie Sayre, MD   Brief Narrative: Patient is a 48 year old male with history of psoriatic arthritis, psoriasis, alcoholic liver cirrhosis, obesity, recent infection of the hardware on the right ankle status post removal of the hardware and currently on IV antibiotics through PICC line following with Dr. Montine who presented with complaint of shortness of breath.  Patient was at PCPs office who noticed that he had severely diminished sounds on the right side and was sent to the emergency department.  Patient has a history of hepatic hydrothorax and has undergone thoracentesis several times.  On presentation, he was hemodynamically stable.  Chest imaging showed large right pleural effusion.  IR consulted.  Underwent thoracentesis with removal of 1.7 L of pleural fluid.  Patient lives alone, ambulates with the help of wheelchair, cannot bear weight on the right ankle and has poor support at home and has been having difficulty on administration of antibiotic.  PT consulted and recommended SNF.  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  Has underwent thoracentesis in the past.  Presented with right pleural effusion.  IR did thoracentesis with removal of 1.7 L of pleural fluid.  Currently on room air  Volume overload/decompensated alcoholic cirrhosis: Has  bilateral, Lasix  at home.  Currently on IV Lasix .  Still has significant lower  extremity edema.  Currently not taking alcohol.  Takes oral Lasix  and losartan at home.  Elevated bilirubin/mild elevated liver enzymes: Likely secondary to liver cirrhosis.  Follows with [facility].We recommend to follow-up with [facility] as an outpatient.  Elevated ammonia level: Report of being confused at home.  Currently alert and oriented.  Continue lactulose  Infected right ankle hardware: History of recent infection of the hardware on the right ankle status post removal of the hardware and currently on IV antibiotics through PICC line following with Dr. Montine.  Follow-up with ID and orthopedics as an outpatient  Psoriatic arthritis: Was previously on DMARD but currently on hold due to infection of the right ankle  Normocytic anemia/thrombocytopenia: No evidence of acute blood loss.  Likely associated  with liver cirrhosis.  Currently stable  Obesity: BMI of 42.9  Debility/deconditioning: Patient lives alone, ambulates with the help of wheelchair, cannot bear weight on the right ankle and has poor support at home and has been having difficulty on administration of antibiotic.  PT consulted and recommended SNF.  TOC consulted today        DVT prophylaxis:enoxaparin  (LOVENOX ) injection 40 mg Start: 04/06/24 1000     Code Status: Full Code  Family Communication: None at the bedside  Patient status: Inpatient  Patient is from :home  Anticipated discharge to:SNF  Estimated DC date: Whenever possible   Consultants: None  Procedures:  thoracentesis  Antimicrobials:  Anti-infectives (From admission, onward)    Start     Dose/Rate Route Frequency Ordered Stop   04/05/24 2200  ceFAZolin  (ANCEF ) IVPB 2g/100 mL premix  Status:  Discontinued        2 g 200 mL/hr over 30 Minutes Intravenous Every 8 hours 04/05/24 1800 04/05/24 1811   04/05/24 1715  ceFAZolin  (ANCEF ) IVPB 2g/100 mL premix       Note to Pharmacy: Indication:  Right Ankle Osteomyelitis First Dose:  Yes Last Day of Therapy:  05/02/2024 Labs - Once weekly:  CBC/D and BMP, Labs - Once weekly: ESR and CRP Method of administration: IV Push   2 g 200 mL/hr over 30 Minutes Intravenous Every 8 hours 04/05/24 1627         Subjective: Patient seen and examined at bedside today.  Appears comfortable.  Alert oriented.  Sitting in the chair.  Has significant lower extremity edema.  On room air.  Denies any shortness of breath or cough.  He says he is agreeable for SNF.  He says he has limited support at home.  Objective: Vitals:   04/05/24 2104 04/06/24 0109 04/06/24 0658 04/06/24 1135  BP: (!) 106/57 (!) 113/56 (!) 102/50   Pulse: 88 73 63   Resp: 18 18 19    Temp: 98.6 F (37 C) 98.5 F (36.9 C) 98.3 F (36.8 C)   TempSrc:      SpO2: 100% 90% 92% 98%  Weight:      Height:        Intake/Output Summary (Last 24 hours) at 04/06/2024 1152 Last data filed at 04/06/2024 0604 Gross per 24 hour  Intake 300 ml  Output 600 ml  Net -300 ml   Filed Weights   04/05/24 1814  Weight: 117.1 kg    Examination:  General exam: Overall comfortable, not in distress, morbidly obese HEENT: PERRL Respiratory system:  no wheezes or crackles, diminished air sounds on the right side Cardiovascular system: S1 & S2 heard, RRR.  Gastrointestinal system: Abdomen is nondistended, soft and nontender. Central nervous system: Alert and oriented Extremities: Bilateral lower extremity pitting edema, no clubbing ,no cyanosis, right ankle covered with dressing Skin: No rashes, no ulcers,no icterus     Data Reviewed: I have personally reviewed following labs and imaging studies  CBC: Recent Labs  Lab 03/30/24 1313 04/05/24 1331 04/05/24 1836 04/06/24 0100  WBC 6.8 5.2 4.9 5.9  NEUTROABS  --  3.5 3.2  --   HGB 10.7* 10.4* 9.7* 9.1*  HCT 33.3* 33.3* 31.0* 29.6*  MCV 101.2* 103.4* 103.0* 103.1*  PLT 114* 110* 103* 108*   Basic Metabolic Panel: Recent Labs  Lab 03/30/24 1313 04/05/24 1331  04/05/24 1836 04/06/24 0100  NA 133* 138 140 138  K 3.8 3.9 3.9 3.7  CL 100 103 103 103  CO2 27 31 30  32  GLUCOSE 103* 80 86 91  BUN 23* 28* 26* 25*  CREATININE 0.82 0.77 0.86 0.86  CALCIUM 8.3* 8.6* 8.5* 8.2*  MG  --   --   --  1.7     Recent Results (from the past 240 hours)  Resp panel by RT-PCR (RSV, Flu A&B, Covid) Anterior Nasal Swab     Status: None   Collection Time: 04/05/24  1:13 PM   Specimen: Anterior Nasal Swab  Result Value Ref Range Status   SARS Coronavirus 2 by RT PCR NEGATIVE NEGATIVE Final    Comment: (NOTE) SARS-CoV-2 target nucleic acids are NOT DETECTED.  The SARS-CoV-2 RNA is generally detectable in upper respiratory specimens during the acute phase of infection. The lowest concentration of SARS-CoV-2 viral copies this assay can detect is 138 copies/mL. A negative result does  not preclude SARS-Cov-2 infection and should not be used as the sole basis for treatment or other patient management decisions. A negative result may occur with  improper specimen collection/handling, submission of specimen other than nasopharyngeal swab, presence of viral mutation(s) within the areas targeted by this assay, and inadequate number of viral copies(<138 copies/mL). A negative result must be combined with clinical observations, patient history, and epidemiological information. The expected result is Negative.  Fact Sheet for Patients:  bloggercourse.com  Fact Sheet for Healthcare Providers:  seriousbroker.it  This test is no t yet approved or cleared by the [place]  FDA and  has been authorized for detection and/or diagnosis of SARS-CoV-2 by FDA under an Emergency Use Authorization (EUA). This EUA will remain  in effect (meaning this test can be used) for the duration of the COVID-19 declaration under Section 564(b)(1) of the Act, 21 U.S.C.section 360bbb-3(b)(1), unless the authorization is terminated  or  revoked sooner.       Influenza A by PCR NEGATIVE NEGATIVE Final   Influenza B by PCR NEGATIVE NEGATIVE Final    Comment: (NOTE) The Xpert Xpress SARS-CoV-2/FLU/RSV plus assay is intended as an aid in the diagnosis of influenza from Nasopharyngeal swab specimens and should not be used as a sole basis for treatment. Nasal washings and aspirates are unacceptable for Xpert Xpress SARS-CoV-2/FLU/RSV testing.  Fact Sheet for Patients: bloggercourse.com  Fact Sheet for Healthcare Providers: seriousbroker.it  This test is not yet approved or cleared by the [place]  FDA and has been authorized for detection and/or diagnosis of SARS-CoV-2 by FDA under an Emergency Use Authorization (EUA). This EUA will remain in effect (meaning this test can be used) for the duration of the COVID-19 declaration under Section 564(b)(1) of the Act, 21 U.S.C. section 360bbb-3(b)(1), unless the authorization is terminated or revoked.     Resp Syncytial Virus by PCR NEGATIVE NEGATIVE Final    Comment: (NOTE) Fact Sheet for Patients: bloggercourse.com  Fact Sheet for Healthcare Providers: seriousbroker.it  This test is not yet approved or cleared by the [place]  FDA and has been authorized for detection and/or diagnosis of SARS-CoV-2 by FDA under an Emergency Use Authorization (EUA). This EUA will remain in effect (meaning this test can be used) for the duration of the COVID-19 declaration under Section 564(b)(1) of the Act, 21 U.S.C. section 360bbb-3(b)(1), unless the authorization is terminated or revoked.  Performed at [facility], [address], [place], [place] [address]   Body fluid culture w Gram Stain     Status: None (Preliminary result)   Collection Time: 04/05/24  4:46 PM   Specimen: Pleural, Right; Body Fluid  Result Value Ref Range Status   Specimen  Description   Final    Pleural R Performed at [facility], [address], [place], [place] [address]    Special Requests   Final    NONE Performed at [facility], [address] [place], [place] [address]    Gram Stain NO WBC SEEN NO ORGANISMS SEEN   Final   Culture   Final    NO GROWTH < 12 HOURS Performed at [facility], [address], [place], [place] [address]    Report Status PENDING  Incomplete     Radiology Studies: IR THORACENTESIS ASP PLEURAL SPACE W/IMG GUIDE Result Date: 04/05/2024 INDICATION: Patient with recurrent right pleural effusion. Underwent thoracentesis 03/20/24 (2.2L) and 02/21/24 (650 mL). He is currently inpatient, presenting with [facility]. History of  cirrhosis. EXAM: ULTRASOUND GUIDED DIAGNOSTIC AND THERAPEUTIC RIGHT THORACENTESIS MEDICATIONS: 10 mL 1% lidocaine  with epi COMPLICATIONS: None immediate. PROCEDURE: An ultrasound guided thoracentesis was thoroughly discussed with the patient and questions answered. The benefits, risks, alternatives and complications were also discussed. The patient understands and wishes to proceed with the procedure. Written consent was obtained. Ultrasound was performed to localize and mark an adequate pocket of fluid in the right chest. The area was then prepped and draped in the normal sterile fashion. 1% Lidocaine  was used for local anesthesia. Under ultrasound guidance a 6 Fr Safe-T-Centesis catheter was introduced. Thoracentesis was performed. The catheter was removed and a dressing applied. CXR ordered. FINDINGS: A total of approximately 1.75 liters of hazy, yellow fluid was removed. Samples were sent to the laboratory as requested by the clinical team. IMPRESSION: Successful ultrasound guided right thoracentesis yielding 1.75 liters of pleural fluid. The patient has required >/=2 thoracenteses (for hepatic hydrothorax) in a 30 day period and a formal evaluation by the [facility]  Interventional Radiology [facility] has been arranged per 03/20/24 procedure note by Dr. Hughes. Performed and dictated by Laymon Coast, NP under the supervision of Dr. Philip. Electronically Signed   By: Juliene Philip M.D.   On: 04/05/2024 18:22   DG Chest 1 View Result Date: 04/05/2024 EXAM: 1 VIEW(S) XRAY OF THE CHEST 04/05/2024 05:21:03 PM COMPARISON: 10:28 04/05/2024, 03/22/2024 CLINICAL HISTORY: [id number] Status post thoracentesis [id number]. Status post thoracentesis ; Right side 1.7 liters Status post thoracentesis ; Right side 1.7 liters FINDINGS: LINES, TUBES AND DEVICES: Right upper extremity central venous catheter tip overlies the SVC. LUNGS AND PLEURA: Persistent airspace disease at the right base. Decreased right-sided pleural effusion with moderate residual, which may be loculated. No pulmonary edema. No definitive right pneumothorax. HEART AND MEDIASTINUM: Stable cardiomegaly and mild central congestion. BONES AND SOFT TISSUES: No acute osseous abnormality. IMPRESSION: 1. Decreased right-sided pleural effusion with moderate residual, which may be loculated. No visible right pneumothorax 2. Persistent right basilar airspace disease. 3. Stable cardiomegaly with mild central congestion. Electronically signed by: Luke Bun MD 04/05/2024 05:30 PM EDT RP Workstation: [id number]   DG Chest 2 View Result Date: 04/05/2024 EXAM: 2 VIEW(S) XRAY OF THE CHEST 04/05/2024 01:09:00 PM COMPARISON: Comparison 03/22/2024. CLINICAL HISTORY: dib, pleural eff. dib, pleural eff; Pt states he has pleural effusion in right lung, symptoms include SOB. FINDINGS: LINES, TUBES AND DEVICES: Right-sided PICC line is again noted. LUNGS AND PLEURA: Large right pleural effusion is noted which is increased in size compared to prior exam. Small amount of aerated right upper lobe is noted. No pulmonary edema. No pneumothorax. HEART AND MEDIASTINUM: No acute abnormality of the cardiac and mediastinal silhouettes.  BONES AND SOFT TISSUES: No acute osseous abnormality. IMPRESSION: 1. Large right pleural effusion, increased in size compared to the prior exam. Electronically signed by: Lynwood Seip MD 04/05/2024 01:32 PM EDT RP Workstation: [id number]    Scheduled Meds:  Chlorhexidine  Gluconate Cloth  6 each Topical Daily   clobetasol  cream   Topical BID   enoxaparin  (LOVENOX ) injection  40 mg Subcutaneous Q24H   folic acid   1 mg Oral Q1200   furosemide   20 mg Intravenous BID   loteprednol  1 drop Both Eyes QID   nadolol   20 mg Oral Daily   pantoprazole   40 mg Oral BID   sodium chloride  flush  10-40 mL Intracatheter Q12H   Continuous Infusions:  ceFAZolin  2 g (04/06/24 0527)     LOS: 1 day  Ivonne Mustache, MD [facility] P10/29/2025, 11:52 AM

## 2024-04-06 NOTE — Evaluation (Addendum)
 Physical Therapy Evaluation Patient Details Name: Thomas [name] MRN: [id number] DOB: Jan 09, 1976 Today's Date: 04/06/2024  History of Present Illness  48 y.o. male admitted with SOB, LLE edema, Dx of pleural effusion. Pt with medical history significant for psoriatic arthritis, psoriasis, alcoholic liver cirrhosis, and obesity was just discharged 6 days ago.  The patient was hospitalized from October 12 through October 22.  He had infection of the hardware in his right ankle and is status post removal of the hardware and is on outpatient IV antibiotics through PICC line. Also admitted 9/13-9/17 with a pleural effusion.  Clinical Impression  Pt admitted with above diagnosis. Pt was seen by OT just prior to PT session, he pivoted bed to recliner with OT. Pt stated that at home since recent DC 10/22 he wasn't walking 2* SOB, he was using his manual WC. Today pt stated he was too SOB to attempt ambulation but agreed to standing with RW, he stood for 90 seconds and performed R hip abduction AROM. SpO2 94% room air with standing, 98% room air at rest. Reviewed RLE strengthening exercises and encouraged pt to perform these independently to minimize atrophy while he is NWB. Pt reports he's communicating with his primary care Dr about getting a motorized WC as he's not able to safely navigate his WC ramp, he has no help available at home. Pt currently with functional limitations due to the deficits listed below (see PT Problem List). Pt will benefit from acute skilled PT to increase their independence and safety with mobility to allow discharge.           If plan is discharge home, recommend the following: A little help with bathing/dressing/bathroom;Assistance with cooking/housework;Help with stairs or ramp for entrance;Assist for transportation   Can travel by private vehicle   Yes    Equipment Recommendations Other (comment) (motorized WC (pt stated primary care Dr is working on this))  Recommendations  for [employer]       Functional Status Assessment Patient has had a recent decline in their functional status and demonstrates the ability to make significant improvements in function in a reasonable and predictable amount of time.     Precautions / Restrictions Precautions Precautions: Fall Recall of Precautions/Restrictions: Intact Restrictions Weight Bearing Restrictions Per Provider Order: Yes RLE Weight Bearing Per Provider Order: Non weight bearing      Mobility  Bed Mobility               General bed mobility comments: up in recliner    Transfers Overall transfer level: Needs assistance Equipment used: Rolling walker (2 wheels)   Sit to Stand: Supervision           General transfer comment: VCs hand placement, pt stood with RW for 90 seconds, pt reported L ankle pain in standing which he attributes to edema, pt performed standing R hip ABDuction AROM x 10. Standing tolerance limited by pain and fatigue. SpO2 94% room air with standing.    Ambulation/Gait               General Gait Details: deferred 2* fatigue  Stairs            Wheelchair Mobility     Tilt Bed    Modified Rankin (Stroke Patients Only)       Balance Overall balance assessment: Needs assistance Sitting-balance support: No upper extremity supported, Feet supported Sitting balance-Leahy Scale: Good     Standing balance support: Bilateral upper extremity supported Standing balance-Leahy Scale:  Fair Standing balance comment: BUE support for dynamic standing 2* NWB RLE                             Pertinent Vitals/Pain Pain Assessment Pain Score: 7  Pain Location: R ankle at rest, L ankle in standing (2* edema per pt) Pain Descriptors / Indicators: Discomfort, Dull, Sore Pain Intervention(s): Limited activity within patient's tolerance, Monitored during session, Repositioned, Patient requesting pain meds-RN notified    Home Living Family/patient  expects to be discharged to:: Private residence Living Arrangements: Parent Available Help at Discharge: Family;Available PRN/intermittently Type of Home: House Home Access: Ramped entrance Entrance Stairs-Rails: None (pt reports having ramp built) Entrance Stairs-Number of Steps: 3 Alternate Level Stairs-Number of Steps: Flight Home Layout: Two level;Bed/bath upstairs;1/2 bath on main level;Other (Comment) (has been sleeping on the couch) Home Equipment: Cane - single point;Tub bench;Rolling Walker (2 wheels);Wheelchair - manual;Hospital bed Additional Comments: dad lives with pt but dad is in residential hospice, so doesn't have help available, wants a motorized scooter due to difficulty managing WC on ramp    Prior Function Prior Level of Function : Independent/Modified Independent;Driving             Mobility Comments: Indep no AD. Denies falls in the past 6 mo. ADLs Comments: Indep with ADL/IADLs. Pt utilizes tub bench. Enjoys dancing. Not working since May, laid off. Drives     Extremity/Trunk Assessment   Upper Extremity Assessment Upper Extremity Assessment: Defer to OT evaluation    Lower Extremity Assessment Lower Extremity Assessment: RLE deficits/detail RLE Deficits / Details: ankle in splint, knee ext at least 3/5, sensation intact at toes, can wiggle toes, can march in seated position RLE: Unable to fully assess due to immobilization RLE Sensation: WNL RLE Coordination: WNL    Cervical / Trunk Assessment Cervical / Trunk Assessment: Normal Cervical / Trunk Exceptions: Body Habitus  Communication   Communication Communication: No apparent difficulties    Cognition Arousal: Alert Behavior During Therapy: WFL for tasks assessed/performed                             Following commands: Intact       Cueing Cueing Techniques: Verbal cues     General Comments      Exercises General Exercises - Lower Extremity Quad Sets: AROM, Right, 5  reps, Supine Gluteal Sets: AROM, Both, 5 reps, Seated Long Arc Quad: AROM, Right, 10 reps, Seated Hip ABduction/ADduction: AROM, Right, 10 reps, Standing Hip Flexion/Marching: AROM, Right, Seated, 10 reps   Assessment/Plan    PT Assessment Patient needs continued PT services  PT Problem List Decreased strength;Decreased range of motion;Decreased activity tolerance;Decreased balance;Decreased mobility;Pain       PT Treatment Interventions DME instruction;Gait training;Functional mobility training;Therapeutic activities;Therapeutic exercise;Balance training    PT Goals (Current goals can be found in the Care Plan section)  Acute Rehab PT Goals Patient Stated Goal: be able to walk PT Goal Formulation: With patient Time For Goal Achievement: 04/05/24 Potential to Achieve Goals: Good    Frequency Min 3X/week     Co-evaluation               AM-PAC PT 6 Clicks Mobility  Outcome Measure Help needed turning from your back to your side while in a flat bed without using bedrails?: None Help needed moving from lying on your back to sitting on the side of a  flat bed without using bedrails?: None Help needed moving to and from a bed to a chair (including a wheelchair)?: A Little Help needed standing up from a chair using your arms (e.g., wheelchair or bedside chair)?: A Little Help needed to walk in hospital room?: A Little Help needed climbing 3-5 steps with a railing? : Total 6 Click Score: 18    End of Session   Activity Tolerance: Patient limited by pain;Patient limited by fatigue Patient left: in chair (no chair alarm box in room) Nurse Communication: Mobility status PT Visit Diagnosis: Difficulty in walking, not elsewhere classified (R26.2);Pain Pain - Right/Left: Right Pain - part of body: Ankle and joints of foot    Time: 8950-8887 PT Time Calculation (min) (ACUTE ONLY): 23 min   Charges:   PT Evaluation $PT Eval Moderate Complexity: 1 Mod  Therapeutic  activity 8-22 minutes   PT General Charges $$ ACUTE PT VISIT: 1 Visit        Sylvan Delon Copp PT 04/06/2024  Acute Rehabilitation Services  Office [phone]

## 2024-04-06 NOTE — Telephone Encounter (Signed)
 Patient has already received his wheelchair nothing further needed

## 2024-04-06 NOTE — [place] FL2 (Signed)
   MEDICAID FL2 LEVEL OF CARE FORM     IDENTIFICATION  Patient Name: VAIBHAV FOGLEMAN Birthdate: 1975-12-08 Sex: male Admission Date (Current Location): 04/05/2024  [facility] and [place] Number:  Producer, Television/film/video and Address:  [facility],  [address], [place] [address]      Provider Number: [id number]  Attending Physician Name and Address:  Jillian Buttery, MD  Relative Name and Phone Number:  Albertha Browning (Sister)  [phone]    Current Level of Care: Hospital Recommended Level of Care: Skilled Nursing Facility Prior Approval Number:    Date Approved/Denied:   PASRR Number: [id number]  Discharge Plan: SNF    Current Diagnoses: 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   Wound infection complicating hardware, initial encounter 03/20/2024   High risk medication use 02/29/2024   Pleural effusion associated with hepatic disorder 02/20/2024   Other ascites 06/18/2023   Cellulitis of abdominal wall 03/10/2023   Esophageal varices without bleeding (HCC) 02/13/2023   History of ETOH abuse 01/09/2023   Ankle fracture 01/07/2023   Localized edema 01/07/2023   Closed right ankle fracture 01/06/2023   Thrombocytopenia 01/06/2023   Cirrhosis of liver (HCC) 12/25/2022   Secondary esophageal varices with bleeding (HCC) 12/25/2022   Reactive airway disease 07/28/2022   Acute upper GI bleed 07/05/2022   Hematemesis with nausea 07/05/2022   Melena 07/05/2022   Pharyngitis 10/22/2021   Psoriatic arthritis (HCC) 10/03/2021   Viral upper respiratory tract infection 10/03/2021   Acute blood loss anemia 08/26/2021   Screening for tuberculosis 08/15/2021   Elevated BP without diagnosis of hypertension 06/14/2021   Need for immunization  against influenza 06/14/2021   Alcoholic cirrhosis of liver (HCC) 09/26/2019   Class 3 severe obesity due to excess calories with body mass index (BMI) of 50.0 to 59.9 in adult [facility]) 09/26/2019   Healthcare maintenance 02/07/2019   Psoriasis 02/07/2019   Vitamin D  deficiency 05/07/2016   OSA (obstructive sleep apnea) 06/23/2013   Insomnia 06/16/2013   Alcohol use disorder 12/25/2011   Scrotal varices, 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   Alcoholic fatty liver 01/16/2010   Allergic rhinitis 10/12/2007    Orientation RESPIRATION BLADDER Height & Weight     Self, Time, Situation, Place  Normal Continent Weight: 258 lb 3.2 oz (117.1 kg) Height:  5' 5 (165.1 cm)  BEHAVIORAL SYMPTOMS/MOOD NEUROLOGICAL BOWEL NUTRITION STATUS      Continent Diet (Regular)  AMBULATORY STATUS COMMUNICATION OF NEEDS Skin   Extensive Assist Verbally Other (Comment) (Wound 03/20/24 1358 Infection Foot Anterior;Right;Lateral and Wound 03/21/24 1453 Surgical Closed Surgical Incision Ankle)                       Personal Care Assistance Level of Assistance  Bathing, Feeding, Dressing Bathing Assistance: Limited assistance Feeding assistance: Independent Dressing Assistance: Limited assistance     Functional Limitations Info  Sight, Hearing, Speech Sight Info: Adequate Hearing Info: Adequate Speech Info: Adequate    SPECIAL CARE FACTORS FREQUENCY  PT (By licensed PT), OT (By licensed OT)     PT Frequency: 5x per week OT Frequency: 5x per week            Contractures Contractures Info: Not present    Additional Factors Info  Code Status, Allergies Code Status Info: FULL  Allergies Info: Oxycodone , Codeine, Other           Current Medications (04/06/2024):  This is the current hospital active medication list Current Facility-Administered Medications  Medication Dose Route Frequency Provider Last Rate Last Admin   acetaminophen  (TYLENOL ) tablet 325-650 mg  325-650 mg Oral Q4H PRN Love, Pamela S, PA-C    650 mg at 04/06/24 1112   albuterol  (PROVENTIL ) (2.5 MG/3ML) 0.083% nebulizer solution 2.5 mg  2.5 mg Nebulization Q2H PRN Love, Pamela S, PA-C       alum & mag hydroxide-simeth (MAALOX/MYLANTA) 200-200-20 MG/5ML suspension 30 mL  30 mL Oral Q4H PRN Love, Pamela S, PA-C       artificial tears ophthalmic solution 1 drop  1 drop Both Eyes PRN Love, Pamela S, PA-C       bisacodyl  (DULCOLAX) suppository 10 mg  10 mg Rectal Daily PRN Love, Pamela S, PA-C       ceFAZolin  (ANCEF ) IVPB 2g/100 mL premix  2 g Intravenous Q8H Claiborne, Claudia, MD 200 mL/hr at 04/06/24 1504 2 g at 04/06/24 1504   Chlorhexidine  Gluconate Cloth 2 % PADS 6 each  6 each Topical Daily Love, Pamela S, PA-C   6 each at 04/06/24 1012   clobetasol  cream (TEMOVATE ) 0.05 %   Topical BID Love, Pamela S, PA-C   Given at 04/06/24 1008   diphenhydrAMINE  (BENADRYL ) capsule 25 mg  25 mg Oral Q6H PRN Love, Pamela S, PA-C       enoxaparin  (LOVENOX ) injection 40 mg  40 mg Subcutaneous Q24H Love, Pamela S, PA-C   40 mg at 04/06/24 1007   folic acid  (FOLVITE ) tablet 1 mg  1 mg Oral Q1200 Love, Pamela S, PA-C   1 mg at 04/06/24 1112   furosemide  (LASIX ) injection 40 mg  40 mg Intravenous BID Jillian Buttery, MD       guaiFENesin -dextromethorphan (ROBITUSSIN DM) 100-10 MG/5ML syrup 5-10 mL  5-10 mL Oral Q6H PRN Love, Pamela S, PA-C       lactulose (CHRONULAC) 10 GM/15ML solution 30 g  30 g Oral TID Adhikari, Amrit, MD       loteprednol (LOTEMAX) 0.5 % ophthalmic suspension 1 drop  1 drop Both Eyes QID Love, Pamela S, PA-C       methocarbamol  (ROBAXIN ) tablet 500 mg  500 mg Oral Q6H PRN Adhikari, Amrit, MD   500 mg at 04/06/24 1216   nadolol  (CORGARD ) tablet 20 mg  20 mg Oral Daily Love, Pamela S, PA-C   20 mg at 04/06/24 1007   ondansetron  (ZOFRAN ) tablet 4 mg  4 mg Oral Q6H PRN Arthea Child, MD       Or   ondansetron  (ZOFRAN ) injection 4 mg  4 mg Intravenous Q6H PRN Arthea Child, MD       oxyCODONE  (Oxy IR/ROXICODONE ) immediate  release tablet 5 mg  5 mg Oral Q4H PRN Love, Pamela S, PA-C       pantoprazole  (PROTONIX ) EC tablet 40 mg  40 mg Oral BID Love, Pamela S, PA-C   40 mg at 04/06/24 1012   prochlorperazine (COMPAZINE) tablet 5-10 mg  5-10 mg Oral Q6H PRN Love, Pamela S, PA-C       Or   prochlorperazine (COMPAZINE) suppository 12.5 mg  12.5 mg Rectal Q6H PRN Love, Pamela S, PA-C       Or   prochlorperazine (COMPAZINE) injection 5-10 mg  5-10 mg Intravenous Q6H PRN Love, Pamela S, PA-C       sodium chloride  flush (  NS) 0.9 % injection 10-40 mL  10-40 mL Intracatheter Q12H Maurice Sharlet RAMAN, PA-C   10 mL at 04/06/24 1012   sodium chloride  flush (NS) 0.9 % injection 10-40 mL  10-40 mL Intracatheter PRN Love, Pamela S, PA-C       sodium phosphate  (FLEET) enema 1 enema  1 enema Rectal Once PRN Love, Pamela S, PA-C       spironolactone  (ALDACTONE ) tablet 25 mg  25 mg Oral Daily Adhikari, Amrit, MD   25 mg at 04/06/24 1216   traZODone  (DESYREL ) tablet 25-50 mg  25-50 mg Oral QHS PRN Love, Pamela S, PA-C         Discharge Medications: Please see discharge summary for a list of discharge medications.  Relevant Imaging Results:  Relevant Lab Results:   Additional Information SSN: [id number]  Heather DELENA Saltness, LCSW

## 2024-04-06 NOTE — TOC Initial Note (Addendum)
 Transition of Care [facility]) - Initial/Assessment Note    Patient Details  Name: Thomas [name] MRN: [id number] Date of Birth: Apr 12, 1976  [facility]) CM/SW Contact:    Heather DELENA Saltness, LCSW Phone Number: 04/06/2024, 11:48 AM  Clinical Narrative:                 Pt readmitted for shortness of breath. Pt discharged from [facility] 6 days ago. Pt was hospitalized from 10/12 until 10/22. Pt had infection of the hardware in his right ankle and is status post removal of the hardware. Pt discharged home with [facility] PT/OT through [facility]. Pt currently on IV antibiotics via PICC line through [facility] Infusion services. Pt has RW, wheelchair, ramp, and BSC at home. Hospital bed also delivered to home through [facility] during last admission. Pt may beed PTAR home. PT evaluation pending. TOC will continue to follow.    Expected Discharge Plan: Home w Home Health Services Barriers to Discharge: Continued Medical Work up   Patient Goals and CMS Choice Patient states their goals for this hospitalization and ongoing recovery are:: To return home        Expected Discharge Plan and Services In-house Referral: Clinical Social Work Discharge Planning Services: NA Post Acute Care Choice: Home Health Living arrangements for the past 2 months: Single Family Home                 DME Arranged: N/A DME Agency: NA       HH Arranged: PT, OT, RN HH Agency: [facility], [facility] Date HH Agency Contacted: 04/06/24 Time HH Agency Contacted: 1145 Representative spoke with at [facility] Agency: Cindie at [employer] / Holley at [employer]  Prior Living Arrangements/Services Living arrangements for the past 2 months: Single Family Home Lives with:: Self, Relatives Patient language and need for interpreter reviewed:: Yes Do you feel safe going back to the place where you live?: Yes      Need for Family Participation in Patient Care: Yes (Comment) Care giver support system in place?: No (comment) Current home  services: DME, Home OT, Home PT, Home RN Criminal Activity/Legal Involvement Pertinent to Current Situation/Hospitalization: No - Comment as needed  Activities of Daily Living   ADL Screening (condition at time of admission) Independently performs ADLs?: Yes (appropriate for developmental age) Is the patient deaf or have difficulty hearing?: No Does the patient have difficulty seeing, even when wearing glasses/contacts?: No Does the patient have difficulty concentrating, remembering, or making decisions?: No  Permission Sought/Granted Permission sought to share information with : Case Manager, Family Supports Permission granted to share information with : Yes, Verbal Permission Granted  Share Information with NAME: Rosaline Breaker and Richie Legato  Permission granted to share info w AGENCY: [facility] [employer]  Permission granted to share info w Relationship: Sister and father  Permission granted to share info w Contact Information: [phone] and [phone]  Emotional Assessment Appearance:: Appears stated age Attitude/Demeanor/Rapport: Unable to Assess Affect (typically observed): Unable to Assess Orientation: : Oriented to Self, Oriented to Place, Oriented to  Time, Oriented to Situation Alcohol / Substance Use: Alcohol Use Psych Involvement: No (comment)  Admission diagnosis:  Hyperammonemia [E72.20] Pleural effusion [J90] Pleural effusion on right [J90] Dyspnea, unspecified type [R06.00] Wound infection complicating hardware, sequela [T84.7XXS] 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   Wound infection complicating hardware, initial encounter 03/20/2024   High risk medication use 02/29/2024   Pleural effusion associated with hepatic disorder 02/20/2024    Other ascites 06/18/2023   Cellulitis of abdominal wall 03/10/2023   Esophageal varices without bleeding (HCC) 02/13/2023   History of ETOH abuse 01/09/2023   Ankle fracture 01/07/2023   Localized edema 01/07/2023   Closed right ankle fracture 01/06/2023   Thrombocytopenia 01/06/2023   Cirrhosis of liver (HCC) 12/25/2022   Secondary esophageal varices with bleeding (HCC) 12/25/2022   Reactive airway disease 07/28/2022   Acute upper GI bleed 07/05/2022   Hematemesis with nausea 07/05/2022   Melena 07/05/2022   Pharyngitis 10/22/2021   Psoriatic arthritis (HCC) 10/03/2021   Viral upper respiratory tract infection 10/03/2021   Acute blood loss anemia 08/26/2021   Screening for tuberculosis 08/15/2021   Elevated BP without diagnosis of hypertension 06/14/2021   Need for immunization against influenza 06/14/2021   Alcoholic cirrhosis of liver (HCC) 09/26/2019   Class 3 severe obesity due to excess calories with body mass index (BMI) of 50.0 to 59.9 in adult [facility]) 09/26/2019   Healthcare maintenance 02/07/2019   Psoriasis 02/07/2019   Vitamin D  deficiency 05/07/2016   OSA (obstructive sleep apnea) 06/23/2013   Insomnia 06/16/2013   Alcohol use disorder 12/25/2011   Scrotal varices, 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   Alcoholic fatty liver 01/16/2010   Allergic rhinitis 10/12/2007   PCP:  Berneta Elsie Sayre, MD Pharmacy:   [facility] [address] [place] - [address] [place] [address] Phone: [phone] Fax: [phone]     Social Drivers of Health (SDOH) Social History: SDOH Screenings   Food Insecurity: No Food Insecurity (04/05/2024)  Housing: Low Risk  (04/05/2024)  Transportation Needs: No Transportation Needs (04/05/2024)  Utilities: Not At Risk (04/05/2024)  Depression (PHQ2-9): Low Risk  (03/31/2024)  Financial Resource Strain: Low Risk  (11/26/2023)  Physical Activity: Insufficiently Active (11/26/2023)  Social Connections:  Socially Isolated (11/26/2023)  Stress: No Stress Concern Present (11/26/2023)  Tobacco Use: High Risk (04/05/2024)   SDOH Interventions: None indicated     Readmission Risk Interventions    04/06/2024   11:43 AM 03/23/2024    3:12 PM  Readmission Risk Prevention Plan  Transportation Screening Complete Complete  PCP or Specialist Appt within 5-7 Days  Complete  Home Care Screening  Complete  Medication Review (RN CM)  Complete  Medication Review (RN Care Manager) Complete   PCP or Specialist appointment within 3-5 days of discharge Complete   HRI or Home Care Consult Complete   SW Recovery Care/Counseling Consult Complete   Palliative Care Screening Not Applicable   Skilled Nursing Facility Not Applicable     Signed: Heather Saltness, MSW, LCSW Clinical Social Worker Inpatient Care Management 04/06/2024 11:53 AM

## 2024-04-06 NOTE — Plan of Care (Signed)

## 2024-04-07 DIAGNOSIS — J90 Pleural effusion, not elsewhere classified: Secondary | ICD-10-CM | POA: Diagnosis not present

## 2024-04-07 LAB — CBC
HCT: 31.1 % — ABNORMAL LOW (ref 39.0–52.0)
Hemoglobin: 9.9 g/dL — ABNORMAL LOW (ref 13.0–17.0)
MCH: 32.9 pg (ref 26.0–34.0)
MCHC: 31.8 g/dL (ref 30.0–36.0)
MCV: 103.3 fL — ABNORMAL HIGH (ref 80.0–100.0)
Platelets: 108 K/uL — ABNORMAL LOW (ref 150–400)
RBC: 3.01 MIL/uL — ABNORMAL LOW (ref 4.22–5.81)
RDW: 15.6 % — ABNORMAL HIGH (ref 11.5–15.5)
WBC: 6.1 K/uL (ref 4.0–10.5)
nRBC: 0 % (ref 0.0–0.2)

## 2024-04-07 LAB — BASIC METABOLIC PANEL WITH GFR
Anion gap: 7 (ref 5–15)
BUN: 21 mg/dL — ABNORMAL HIGH (ref 6–20)
CO2: 31 mmol/L (ref 22–32)
Calcium: 8 mg/dL — ABNORMAL LOW (ref 8.9–10.3)
Chloride: 103 mmol/L (ref 98–111)
Creatinine, Ser: 1.01 mg/dL (ref 0.61–1.24)
GFR, Estimated: >60 mL/min (ref >60)
Glucose, Bld: 112 mg/dL — ABNORMAL HIGH (ref 70–99)
Potassium: 3.6 mmol/L (ref 3.5–5.1)
Sodium: 140 mmol/L (ref 135–145)

## 2024-04-07 MED ORDER — TRAMADOL HCL 50 MG PO TABS: 50.0000 mg | ORAL_TABLET | Freq: Once | ORAL | Status: DC

## 2024-04-07 MED ORDER — LACTULOSE 10 GM/15ML PO SOLN
20.0000 g | Freq: Three times a day (TID) | ORAL | Status: DC
  Administered 2024-04-08 – 2024-04-12 (×5): 20 g via ORAL
  Filled 2024-04-07 (×9): qty 30

## 2024-04-07 MED ORDER — ENSURE PLUS HIGH PROTEIN PO LIQD
237.0000 mL | Freq: Two times a day (BID) | ORAL | Status: DC
  Administered 2024-04-08 – 2024-04-12 (×7): 237 mL via ORAL

## 2024-04-07 NOTE — Progress Notes (Signed)
 Orthopedic Tech Progress Note Patient Details:  Thomas [name] 01-10-1976 [id number]  Patient ID: Thomas [name] Legato, male   DOB: [birth year], 48 y.o.   MRN: [id number]  Thomas [name] 04/07/2024, 10:59 AM Cam boot fitted. Boot removed bc it was pressing on painful incision site the patient to wear when OOB

## 2024-04-07 NOTE — TOC Progression Note (Addendum)
 Transition of Care [facility]) - Progression Note    Patient Details  Name: Thomas [name] MRN: [id number] Date of Birth: [birth year]  Transition of Care [facility]) CM/SW Contact  Heather DELENA Saltness, LCSW Phone Number: 04/07/2024, 2:36 PM  Clinical Narrative:    CSW met with pt at bedside to discuss SNF bed offers and obtain pt's choice. CSW advised pt of only one bed offer, [facility], in [place]. CSW explained that most SNF locations don't accept Medicaid. Pt inquired if [facility] would allow pt to leave facility for a few hours to visit/attend his father's funeral. Pt's father currently on hospice, actively dying. CSW spoke with Brittany at [facility] to inquire if pt could leave facility to attend his father's funeral. Per Brittany, pt can leave the facility to visit/attend funeral, but cannot spend the night outside of facility. CSW will follow up with pt tomorrow regarding decision for SNF rehab at [facility].  Medicare Star-Rating  [facility] [address] [place], [place] [address] [phone] Overall rating ? Much below average   Expected Discharge Plan: Home w Home Health Services Barriers to Discharge: Continued Medical Work up  Expected Discharge Plan and Services In-house Referral: Clinical Social Work Discharge Planning Services: NA Post Acute Care Choice: Home Health Living arrangements for the past 2 months: Single Family Home                 DME Arranged: N/A DME Agency: NA       HH Arranged: PT, OT, RN HH Agency: [facility], [facility] Date HH Agency Contacted: 04/06/24 Time HH Agency Contacted: 1145 Representative spoke with at [facility] Agency: Cindie at [employer] / Pam at [employer]   Social Drivers of Health (SDOH) Interventions SDOH Screenings   Food Insecurity: No Food Insecurity (04/05/2024)  Housing: Low Risk  (04/05/2024)  Transportation Needs: No Transportation Needs (04/05/2024)  Utilities: Not At Risk  (04/05/2024)  Depression (PHQ2-9): Low Risk  (03/31/2024)  Financial Resource Strain: Low Risk  (11/26/2023)  Physical Activity: Insufficiently Active (11/26/2023)  Social Connections: Socially Isolated (11/26/2023)  Stress: No Stress Concern Present (11/26/2023)  Tobacco Use: High Risk (04/05/2024)    Readmission Risk Interventions    04/06/2024   11:43 AM 03/23/2024    3:12 PM  Readmission Risk Prevention Plan  Transportation Screening Complete Complete  PCP or Specialist Appt within 5-7 Days  Complete  Home Care Screening  Complete  Medication Review (RN CM)  Complete  Medication Review (RN Care Manager) Complete   PCP or Specialist appointment within 3-5 days of discharge Complete   HRI or Home Care Consult Complete   SW Recovery Care/Counseling Consult Complete   Palliative Care Screening Not Applicable   Skilled Nursing Facility Not Applicable     Signed: Heather Saltness, MSW, LCSW Clinical Social Worker Inpatient Care Management 04/07/2024 2:46 PM

## 2024-04-07 NOTE — Progress Notes (Signed)
 Mobility Specialist Progress Note:   04/07/24 1554  Mobility  Activity  (Bed Exercises)  Level of Assistance Independent  Range of Motion/Exercises Active  Activity Response Tolerated well  Mobility Referral Yes  Mobility visit 1 Mobility  Mobility Specialist Start Time (ACUTE ONLY) 1540  Mobility Specialist Stop Time (ACUTE ONLY) 1550  Mobility Specialist Time Calculation (min) (ACUTE ONLY) 10 min   Pt was received in bed and agreed to mobility: bed exercises Exercises: Leg Lifts: 1 x 5 each leg Leg Extensions: 1 x 5 each leg Theraband lateral pulls: 1 x 5 Front raises: 1 x 5 each arm No complaints at the end of session and all needs were met. Call bell in reach.  [employer] - Mobility Specialist

## 2024-04-07 NOTE — Progress Notes (Signed)
 PROGRESS NOTE  Thomas [name]  FMW:[id number] DOB: 08-Nov-1975 DOA: 04/05/2024 PCP: Berneta Elsie Sayre, MD   Brief Narrative: Patient is a 48 year old male with history of psoriatic arthritis, psoriasis, alcoholic liver cirrhosis, obesity, recent infection of the hardware on the right ankle status post removal of the hardware and currently on IV antibiotics through PICC line following with Dr. Montine who presented with complaint of shortness of breath.  Patient was at PCPs office who noticed that he had severely diminished sounds on the right side and was sent to the emergency department.  Patient has a history of hepatic hydrothorax and has undergone thoracentesis several times.  On presentation, he was hemodynamically stable.  Chest imaging showed large right pleural effusion.  IR consulted.  Underwent thoracentesis with removal of 1.7 L of pleural fluid.  Patient lives alone, ambulates with the help of wheelchair, cannot bear weight on the right ankle and has poor support at home and has been having difficulty on administration of antibiotic.  PT consulted and recommended SNF.  TOC consulted .  Medically stable for discharge whenever possible.  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  Has underwent thoracentesis in the past.  Presented with right pleural effusion.  IR did thoracentesis with removal of 1.7 L of pleural fluid.  Currently on room air  Volume overload/decompensated alcoholic cirrhosis: Has  bilateral, Lasix  at home.   Currently on IV Lasix .  Still has significant lower extremity edema.  Currently not taking alcohol.  Takes oral Lasix  and losartan at home.  Elevated bilirubin/mild elevated liver enzymes: Likely secondary to liver cirrhosis.  Follows with [facility].We recommend to follow-up with [facility] as an outpatient.  Elevated ammonia level: Report of being confused at home.  Currently alert and oriented.  Continue lactulose  Infected right ankle hardware: History of recent infection of the hardware on the right ankle status post removal of the hardware and currently on IV antibiotics through PICC line following with Dr. Montine. Dr.Marchwai saw him here , removed some sutures, recommended 50% weightbearing.  Continue DVT prophylaxis with aspirin for 4 weeks on discharge.  Follow-up with ID and orthopedics as an outpatient.   Psoriatic arthritis: Was previously on DMARD but currently on hold due to infection of the right ankle  Normocytic anemia/thrombocytopenia: No evidence of acute blood loss.  Likely associated  with liver cirrhosis.  Currently stable  Obesity: BMI of 42.9  Debility/deconditioning: Patient lives alone, ambulates with the help of wheelchair, cannot bear weight on the right ankle and has poor support at home and has been having difficulty on administration of antibiotic.  PT consulted and recommended SNF.  TOC consulted         DVT prophylaxis:enoxaparin  (LOVENOX ) injection 40 mg Start: 04/06/24 1000     Code Status: Full Code  Family Communication: None at the bedside  Patient status: Inpatient  Patient is from :home  Anticipated discharge to:SNF  Estimated DC date: Whenever possible   Consultants: None  Procedures:  thoracentesis  Antimicrobials:  Anti-infectives (  From admission, onward)    Start     Dose/Rate Route Frequency Ordered Stop   04/05/24 2200  ceFAZolin  (ANCEF ) IVPB 2g/100 mL premix  Status:  Discontinued        2 g 200 mL/hr over 30 Minutes  Intravenous Every 8 hours 04/05/24 1800 04/05/24 1811   04/05/24 1715  ceFAZolin  (ANCEF ) IVPB 2g/100 mL premix       Note to Pharmacy: Indication:  Right Ankle Osteomyelitis First Dose: Yes Last Day of Therapy:  05/02/2024 Labs - Once weekly:  CBC/D and BMP, Labs - Once weekly: ESR and CRP Method of administration: IV Push   2 g 200 mL/hr over 30 Minutes Intravenous Every 8 hours 04/05/24 1627         Subjective: Patient seen and examined at bedside today.  Hemodynamically stable.  Comfortable.  Sitting on the bed.  On room air.  Lower extremity edema has improved.  Some of the sutures have been removed from right ankle.  He feels much better today.  Objective: Vitals:   04/06/24 1433 04/06/24 2130 04/07/24 0500 04/07/24 0516  BP: 110/61 (!) 109/59  118/63  Pulse: 60 66  69  Resp: 19 18  18   Temp: 98.6 F (37 C) 98.6 F (37 C)  98 F (36.7 C)  TempSrc: Oral Oral  Axillary  SpO2: 93% 96%  94%  Weight:   122.3 kg   Height:        Intake/Output Summary (Last 24 hours) at 04/07/2024 1120 Last data filed at 04/07/2024 0517 Gross per 24 hour  Intake 100 ml  Output 650 ml  Net -550 ml   Filed Weights   04/05/24 1814 04/07/24 0500  Weight: 117.1 kg 122.3 kg    Examination:   General exam: Overall comfortable, not in distress,morbidly obese HEENT: PERRL Respiratory system:  no wheezes or crackles , diminished sounds on the right side Cardiovascular system: S1 & S2 heard, RRR.  Gastrointestinal system: Abdomen is nondistended, soft and nontender. Central nervous system: Alert and oriented Extremities: Trace bilateral lower extremity pitting edema, no clubbing ,no cyanosis, right ankle covered with dressing Skin: No rashes, no ulcers,no icterus       Data Reviewed: I have personally reviewed following labs and imaging studies  CBC: Recent Labs  Lab 04/05/24 1331 04/05/24 1836 04/06/24 0100 04/07/24 0045  WBC 5.2 4.9 5.9 6.1  NEUTROABS 3.5 3.2  --   --   HGB  10.4* 9.7* 9.1* 9.9*  HCT 33.3* 31.0* 29.6* 31.1*  MCV 103.4* 103.0* 103.1* 103.3*  PLT 110* 103* 108* 108*   Basic Metabolic Panel: Recent Labs  Lab 04/05/24 1331 04/05/24 1836 04/06/24 0100 04/07/24 0045  NA 138 140 138 140  K 3.9 3.9 3.7 3.6  CL 103 103 103 103  CO2 31 30 32 31  GLUCOSE 80 86 91 112*  BUN 28* 26* 25* 21*  CREATININE 0.77 0.86 0.86 1.01  CALCIUM 8.6* 8.5* 8.2* 8.0*  MG  --   --  1.7  --      Recent Results (from the past 240 hours)  Resp panel by RT-PCR (RSV, Flu A&B, Covid) Anterior Nasal Swab     Status: None   Collection Time: 04/05/24  1:13 PM   Specimen: Anterior Nasal Swab  Result Value Ref Range Status   SARS Coronavirus 2 by RT PCR NEGATIVE NEGATIVE Final    Comment: (NOTE) SARS-CoV-2 target nucleic acids are NOT DETECTED.  The SARS-CoV-2 RNA is generally detectable in  upper respiratory specimens during the acute phase of infection. The lowest concentration of SARS-CoV-2 viral copies this assay can detect is 138 copies/mL. A negative result does not preclude SARS-Cov-2 infection and should not be used as the sole basis for treatment or other patient management decisions. A negative result may occur with  improper specimen collection/handling, submission of specimen other than nasopharyngeal swab, presence of viral mutation(s) within the areas targeted by this assay, and inadequate number of viral copies(<138 copies/mL). A negative result must be combined with clinical observations, patient history, and epidemiological information. The expected result is Negative.  Fact Sheet for Patients:  bloggercourse.com  Fact Sheet for Healthcare Providers:  seriousbroker.it  This test is no t yet approved or cleared by the [place]  FDA and  has been authorized for detection and/or diagnosis of SARS-CoV-2 by FDA under an Emergency Use Authorization (EUA). This EUA will remain  in effect (meaning  this test can be used) for the duration of the COVID-19 declaration under Section 564(b)(1) of the Act, 21 U.S.C.section 360bbb-3(b)(1), unless the authorization is terminated  or revoked sooner.       Influenza A by PCR NEGATIVE NEGATIVE Final   Influenza B by PCR NEGATIVE NEGATIVE Final    Comment: (NOTE) The Xpert Xpress SARS-CoV-2/FLU/RSV plus assay is intended as an aid in the diagnosis of influenza from Nasopharyngeal swab specimens and should not be used as a sole basis for treatment. Nasal washings and aspirates are unacceptable for Xpert Xpress SARS-CoV-2/FLU/RSV testing.  Fact Sheet for Patients: bloggercourse.com  Fact Sheet for Healthcare Providers: seriousbroker.it  This test is not yet approved or cleared by the [place]  FDA and has been authorized for detection and/or diagnosis of SARS-CoV-2 by FDA under an Emergency Use Authorization (EUA). This EUA will remain in effect (meaning this test can be used) for the duration of the COVID-19 declaration under Section 564(b)(1) of the Act, 21 U.S.C. section 360bbb-3(b)(1), unless the authorization is terminated or revoked.     Resp Syncytial Virus by PCR NEGATIVE NEGATIVE Final    Comment: (NOTE) Fact Sheet for Patients: bloggercourse.com  Fact Sheet for Healthcare Providers: seriousbroker.it  This test is not yet approved or cleared by the [place]  FDA and has been authorized for detection and/or diagnosis of SARS-CoV-2 by FDA under an Emergency Use Authorization (EUA). This EUA will remain in effect (meaning this test can be used) for the duration of the COVID-19 declaration under Section 564(b)(1) of the Act, 21 U.S.C. section 360bbb-3(b)(1), unless the authorization is terminated or revoked.  Performed at [facility], [address], [place], [place] [address]   Body fluid  culture w Gram Stain     Status: None (Preliminary result)   Collection Time: 04/05/24  4:46 PM   Specimen: Pleural, Right; Body Fluid  Result Value Ref Range Status   Specimen Description   Final    Pleural R Performed at [facility], [address] [place], [place] [address]    Special Requests   Final    NONE Performed at [facility], [address], [place], [place] [address]    Gram Stain NO WBC SEEN NO ORGANISMS SEEN   Final   Culture   Final    NO GROWTH 2 DAYS Performed at [facility], [address], [place], [place] [address]    Report Status PENDING  Incomplete     Radiology Studies: IR THORACENTESIS ASP PLEURAL SPACE W/IMG GUIDE Result  Date: 04/05/2024 INDICATION: Patient with recurrent right pleural effusion. Underwent thoracentesis 03/20/24 (2.2L) and 02/21/24 (650 mL). He is currently inpatient, presenting with [facility]. History of cirrhosis. EXAM: ULTRASOUND GUIDED DIAGNOSTIC AND THERAPEUTIC RIGHT THORACENTESIS MEDICATIONS: 10 mL 1% lidocaine  with epi COMPLICATIONS: None immediate. PROCEDURE: An ultrasound guided thoracentesis was thoroughly discussed with the patient and questions answered. The benefits, risks, alternatives and complications were also discussed. The patient understands and wishes to proceed with the procedure. Written consent was obtained. Ultrasound was performed to localize and mark an adequate pocket of fluid in the right chest. The area was then prepped and draped in the normal sterile fashion. 1% Lidocaine  was used for local anesthesia. Under ultrasound guidance a 6 Fr Safe-T-Centesis catheter was introduced. Thoracentesis was performed. The catheter was removed and a dressing applied. CXR ordered. FINDINGS: A total of approximately 1.75 liters of hazy, yellow fluid was removed. Samples were sent to the laboratory as requested by the clinical team. IMPRESSION: Successful ultrasound guided right thoracentesis  yielding 1.75 liters of pleural fluid. The patient has required >/=2 thoracenteses (for hepatic hydrothorax) in a 30 day period and a formal evaluation by the [facility] has been arranged per 03/20/24 procedure note by Dr. Hughes. Performed and dictated by Laymon Coast, NP under the supervision of Dr. Philip. Electronically Signed   By: Juliene Philip M.D.   On: 04/05/2024 18:22   DG Chest 1 View Result Date: 04/05/2024 EXAM: 1 VIEW(S) XRAY OF THE CHEST 04/05/2024 05:21:03 PM COMPARISON: 10:28 04/05/2024, 03/22/2024 CLINICAL HISTORY: [id number] Status post thoracentesis [id number]. Status post thoracentesis ; Right side 1.7 liters Status post thoracentesis ; Right side 1.7 liters FINDINGS: LINES, TUBES AND DEVICES: Right upper extremity central venous catheter tip overlies the SVC. LUNGS AND PLEURA: Persistent airspace disease at the right base. Decreased right-sided pleural effusion with moderate residual, which may be loculated. No pulmonary edema. No definitive right pneumothorax. HEART AND MEDIASTINUM: Stable cardiomegaly and mild central congestion. BONES AND SOFT TISSUES: No acute osseous abnormality. IMPRESSION: 1. Decreased right-sided pleural effusion with moderate residual, which may be loculated. No visible right pneumothorax 2. Persistent right basilar airspace disease. 3. Stable cardiomegaly with mild central congestion. Electronically signed by: Luke Bun MD 04/05/2024 05:30 PM EDT RP Workstation: [id number]   DG Chest 2 View Result Date: 04/05/2024 EXAM: 2 VIEW(S) XRAY OF THE CHEST 04/05/2024 01:09:00 PM COMPARISON: Comparison 03/22/2024. CLINICAL HISTORY: dib, pleural eff. dib, pleural eff; Pt states he has pleural effusion in right lung, symptoms include SOB. FINDINGS: LINES, TUBES AND DEVICES: Right-sided PICC line is again noted. LUNGS AND PLEURA: Large right pleural effusion is noted which is increased in size compared to prior exam. Small  amount of aerated right upper lobe is noted. No pulmonary edema. No pneumothorax. HEART AND MEDIASTINUM: No acute abnormality of the cardiac and mediastinal silhouettes. BONES AND SOFT TISSUES: No acute osseous abnormality. IMPRESSION: 1. Large right pleural effusion, increased in size compared to the prior exam. Electronically signed by: Lynwood Seip MD 04/05/2024 01:32 PM EDT RP Workstation: [id number]    Scheduled Meds:  Chlorhexidine  Gluconate Cloth  6 each Topical Daily   clobetasol  cream   Topical BID   enoxaparin  (LOVENOX ) injection  40 mg Subcutaneous Q24H   folic acid   1 mg Oral Q1200   furosemide   40 mg Intravenous BID   lactulose  30 g Oral TID   loteprednol  1 drop Both Eyes QID   nadolol   20 mg Oral Daily   pantoprazole   40 mg Oral BID   sodium chloride  flush  10-40 mL Intracatheter Q12H   spironolactone   25 mg Oral Daily   Continuous Infusions:  ceFAZolin  2 g (04/07/24 0524)     LOS: 2 days   Ivonne Mustache, MD [facility] P10/30/2025, 11:20 AM

## 2024-04-07 NOTE — Progress Notes (Signed)
 Occupational Therapy Treatment Patient Details Name: Thomas [name] MRN: [id number] DOB: 11/11/1975 Today's Date: 04/07/2024   History of present illness Patient is a 48 year old male admitted with SOB, LLE edema, Dx of pleural effusion and s/p thoracentesis (1.75 L).   Patient was hospitalized from October 12 through October 22.  He had infection of the hardware in his right ankle and is status post removal of the hardware and is on outpatient IV antibiotics through PICC line.  Also admitted 9/13-9/17 with a pleural effusion.  PMHx includes psoriatic arthritis, psoriasis, ETOH cirrhosis, and obesity   OT comments  Patient is progressing toward goals and has been upgraded by ortho to PWB 50% for RLE.  Some sutures were removed and patient was fitted with a CAM boot which is to be worn whenever OOB.  Pt verbalized understanding and provided teach-back of new precautions.  Provided patient with printed HEP and red & green Theraband for UB strengthening in support of transfers.  Patient demonstrated understanding of exercises, including shortening of band between hands to increase resistance.  Patient will benefit from continued inpatient follow up therapy, < 3 hours/day, and patient verbalizes being on board with plan.  Acute OT will continue to follow patient while in the hospital to address performance deficits described herein.  Thank you for allowing us  to participate in the care of this patient.      If plan is discharge home, recommend the following:  A little help with bathing/dressing/bathroom;A little help with walking and/or transfers;Help with stairs or ramp for entrance   Equipment Recommendations  None recommended by OT       Precautions / Restrictions Precautions Precautions: Fall Recall of Precautions/Restrictions: Intact Required Braces or Orthoses: Other Brace Splint/Cast: Right ankle CAM boot Restrictions Weight Bearing Restrictions Per Provider Order: Yes RLE Weight  Bearing Per Provider Order: Partial weight bearing RLE Partial Weight Bearing Percentage or Pounds: 50% Other Position/Activity Restrictions: CAM boot to be worn whenever OOB              ADL either performed or assessed with clinical judgement   ADL Overall ADL's : Needs assistance/impaired Eating/Feeding: Independent;Sitting;Bed level        Cognition Arousal: Alert Behavior During Therapy: WFL for tasks assessed/performed Cognition: No apparent impairments Following commands: Intact           Exercises BUE strengthening with use of red Theraband in multiple planes with handout provided.       General Comments Patient confirmed plans for ST rehab and is awaiting placement.    Pertinent Vitals/ Pain       Pain Assessment Pain Assessment: No/denies pain         Frequency  Min 2X/week        Progress Toward Goals  OT Goals(current goals can now be found in the care plan section)  Progress towards OT goals: Progressing toward goals  Acute Rehab OT Goals Patient Stated Goal: Get better and return home OT Goal Formulation: With patient Time For Goal Achievement: 04/21/24 Potential to Achieve Goals: Good  Plan         AM-PAC OT 6 Clicks Daily Activity     Outcome Measure   Help from another person eating meals?: None Help from another person taking care of personal grooming?: None Help from another person toileting, which includes using toliet, bedpan, or urinal?: A Little Help from another person bathing (including washing, rinsing, drying)?: A Lot Help from another person to put on  and taking off regular upper body clothing?: A Little Help from another person to put on and taking off regular lower body clothing?: A Lot 6 Click Score: 18    End of Session OT Visit Diagnosis: Muscle weakness (generalized) (M62.81);Unsteadiness on feet (R26.81)   Activity Tolerance Patient tolerated treatment well   Patient Left in bed;with call bell/phone  within reach   Nurse Communication Other (comment) (Provision of HEP)        Time: 8850-8798 OT Time Calculation (min): 12 min  Charges: OT General Charges $OT Visit: 1 Visit OT Treatments $Therapeutic Exercise: 8-22 mins  Belvie B. Malayasia Mirkin, MS, OTR/L 04/07/2024, 12:14 PM

## 2024-04-07 NOTE — Progress Notes (Signed)
     Subjective:  Thomas [name] seen this morning.  He was unfortunately readmitted yesterday.  He notes decreased swelling and pain in the left leg.  He has been nonweightbearing in the splint since we last saw him.  Yesterday's total administered Morphine  Milligram Equivalents: 0   Objective:   VITALS:   Vitals:   04/06/24 1433 04/06/24 2130 04/07/24 0500 04/07/24 0516  BP: 110/61 (!) 109/59  118/63  Pulse: 60 66  69  Resp: 19 18  18   Temp: 98.6 F (37 C) 98.6 F (37 C)  98 F (36.7 C)  TempSrc: Oral Oral  Axillary  SpO2: 93% 96%  94%  Weight:   122.3 kg   Height:        Sensation intact distally Dorsiflexion/Plantar flexion intact Splint c/d/I  lateral ankle incision is healing well no erythema or drainage.  Lab Results  Component Value Date   WBC 6.1 04/07/2024   HGB 9.9 (L) 04/07/2024   HCT 31.1 (L) 04/07/2024   MCV 103.3 (H) 04/07/2024   PLT 108 (L) 04/07/2024   BMET    Component Value Date/Time   NA 140 04/07/2024 0045   K 3.6 04/07/2024 0045   CL 103 04/07/2024 0045   CO2 31 04/07/2024 0045   GLUCOSE 112 (H) 04/07/2024 0045   BUN 21 (H) 04/07/2024 0045   CREATININE 1.01 04/07/2024 0045   CREATININE 0.66 06/14/2021 1419   CALCIUM 8.0 (L) 04/07/2024 0045   GFRNONAA >60 04/07/2024 0045   GFRNONAA >89 06/22/2013 0958      Xray: Status post partial hardware removal no adverse features.  Assessment/Plan:     Principal Problem:   Pleural effusion on right Active Problems:   Scrotal varices, left   Alcohol use disorder   OSA (obstructive sleep apnea)   Class 3 severe obesity due to excess calories with body mass index (BMI) of 50.0 to 59.9 in adult [facility])   Psoriatic arthritis (HCC)   Cirrhosis of liver (HCC)   Secondary esophageal varices with bleeding (HCC)   Wound infection complicating hardware, sequela  S/p R ankle I&D for osteomyelitis and draining sinus 03/21/24   The splint and proximal sutures were removed this morning.  3 distal  sutures were retained as this area has not yet completely healed.  A soft dressing was applied.  He can now be 50% weightbearing in cam boot.  Post op recs: WB: 50% PWB RLE in cam walker boot Antibiotics: Currently on scheduled Ancef  per infectious disease Dressing: Dry dressing with ABD and Ace wrap.  Change as needed. DVT prophylaxis: Lovenox  in house, discharge with aspirin 81mg  BID x4 weeks Follow up: Follow-up scheduled for next Thursday for wound check and suture removal Office Phone: [phone]    Thomas [name] 04/07/2024, 7:27 AM   Toribio Higashi, MD  Contact information:   [phone] 7am-5pm epic message Dr. Higashi, or call office for patient follow up: [phone] After hours and holidays please check [email or web address] for group call information for [facility]

## 2024-04-07 NOTE — Plan of Care (Signed)

## 2024-04-07 NOTE — Progress Notes (Signed)
 Orthopedic Tech Progress Note Patient Details:  Thomas [name] Mar 09, 1976 [id number]  Ortho Devices Type of Ortho Device: CAM walker Ortho Device/Splint Location: right Ortho Device/Splint Interventions: Ordered, Application, Adjustment   Post Interventions Patient Tolerated: Fair Instructions Provided: Adjustment of device, Care of device  [name] 04/07/2024, 10:59 AM

## 2024-04-08 ENCOUNTER — Inpatient Hospital Stay (HOSPITAL_COMMUNITY)

## 2024-04-08 DIAGNOSIS — J90 Pleural effusion, not elsewhere classified: Secondary | ICD-10-CM | POA: Diagnosis not present

## 2024-04-08 LAB — COMPREHENSIVE METABOLIC PANEL WITH GFR
ALT: 16 U/L (ref 0–44)
AST: 96 U/L — ABNORMAL HIGH (ref 15–41)
Albumin: 2.3 g/dL — ABNORMAL LOW (ref 3.5–5.0)
Alkaline Phosphatase: 229 U/L — ABNORMAL HIGH (ref 38–126)
Anion gap: 8 (ref 5–15)
BUN: 19 mg/dL (ref 6–20)
CO2: 32 mmol/L (ref 22–32)
Calcium: 8.1 mg/dL — ABNORMAL LOW (ref 8.9–10.3)
Chloride: 102 mmol/L (ref 98–111)
Creatinine, Ser: 0.93 mg/dL (ref 0.61–1.24)
GFR, Estimated: >60 mL/min (ref >60)
Glucose, Bld: 92 mg/dL (ref 70–99)
Potassium: 3.4 mmol/L — ABNORMAL LOW (ref 3.5–5.1)
Sodium: 141 mmol/L (ref 135–145)
Total Bilirubin: 2.4 mg/dL — ABNORMAL HIGH (ref 0.0–1.2)
Total Protein: 6.2 g/dL — ABNORMAL LOW (ref 6.5–8.1)

## 2024-04-08 MED ORDER — POTASSIUM CHLORIDE CRYS ER 20 MEQ PO TBCR
40.0000 meq | EXTENDED_RELEASE_TABLET | Freq: Once | ORAL | Status: AC
  Administered 2024-04-08: 40 meq via ORAL
  Filled 2024-04-08: qty 2

## 2024-04-08 NOTE — TOC Progression Note (Signed)
 Transition of Care [facility]) - Progression Note    Patient Details  Name: Thomas [name] MRN: [id number] Date of Birth: 1975/08/27  Transition of Care [facility]) CM/SW Contact  Sonda Manuella Quill, RN Phone Number: 04/08/2024, 3:44 PM  Clinical Narrative:    PT recc HHPT; spoke w/ pt in room; he agreed to recc service; pt said he does not have agency preference; d/c address verified: [address] [place], [place] [address]; referrals sent via hub; awaiting responses from agencies.   Expected Discharge Plan: Home w Home Health Services Barriers to Discharge: Continued Medical Work up               Expected Discharge Plan and Services In-house Referral: Clinical Social Work Discharge Planning Services: NA Post Acute Care Choice: Home Health Living arrangements for the past 2 months: Single Family Home                 DME Arranged: N/A DME Agency: NA       HH Arranged: PT, OT, RN HH Agency: [facility], [facility] Date HH Agency Contacted: 04/06/24 Time HH Agency Contacted: 1145 Representative spoke with at [facility] Agency: Cindie at [place] / Pam at [employer]   Social Drivers of Health (SDOH) Interventions SDOH Screenings   Food Insecurity: No Food Insecurity (04/05/2024)  Housing: Low Risk  (04/05/2024)  Transportation Needs: No Transportation Needs (04/05/2024)  Utilities: Not At Risk (04/05/2024)  Depression (PHQ2-9): Low Risk  (03/31/2024)  Financial Resource Strain: Low Risk  (11/26/2023)  Physical Activity: Insufficiently Active (11/26/2023)  Social Connections: Socially Isolated (11/26/2023)  Stress: No Stress Concern Present (11/26/2023)  Tobacco Use: High Risk (04/05/2024)    Readmission Risk Interventions    04/06/2024   11:43 AM 03/23/2024    3:12 PM  Readmission Risk Prevention Plan  Transportation Screening Complete Complete  PCP or Specialist Appt within 5-7 Days  Complete  Home Care Screening  Complete  Medication Review (RN CM)  Complete   Medication Review (RN Care Manager) Complete   PCP or Specialist appointment within 3-5 days of discharge Complete   HRI or Home Care Consult Complete   SW Recovery Care/Counseling Consult Complete   Palliative Care Screening Not Applicable   Skilled Nursing Facility Not Applicable

## 2024-04-08 NOTE — Plan of Care (Signed)
 Pt refused oxycodone  for pain management and preferred tylenol  for right ankle pain. Pt with effective results from PRN for trazodone . Pt with increased levels of sadness and anxiety r/t passing of his father this shift.   Problem: Education: Goal: Knowledge of General Education information will improve Description: Including pain rating scale, medication(s)/side effects and non-pharmacologic comfort measures Outcome: Progressing   Problem: Health Behavior/Discharge Planning: Goal: Ability to manage health-related needs will improve Outcome: Progressing   Problem: Clinical Measurements: Goal: Ability to maintain clinical measurements within normal limits will improve Outcome: Progressing Goal: Will remain free from infection Outcome: Progressing Goal: Respiratory complications will improve Outcome: Progressing   Problem: Activity: Goal: Risk for activity intolerance will decrease Outcome: Progressing   Problem: Nutrition: Goal: Adequate nutrition will be maintained Outcome: Progressing

## 2024-04-08 NOTE — Progress Notes (Addendum)
 Physical Therapy Treatment Patient Details Name: Thomas [name] MRN: [id number] DOB: [birth year] Today's Date: 04/08/2024   History of Present Illness Patient is a 48 year old male admitted with SOB, LLE edema, Dx of pleural effusion and s/p thoracentesis (1.75 L).   Patient was hospitalized from October 12 through October 22.  He had infection of the hardware in his right ankle and is status post removal of the hardware and is on outpatient IV antibiotics through PICC line.  WB status 50% RLE 04/07/24. Also admitted 9/13-9/17 with a pleural effusion.  PMHx includes psoriatic arthritis, psoriasis, ETOH cirrhosis, and obesity    PT Comments  Pt is progressing well with mobility. He is now 50% PWB RLE per Dr. Edna. Pt ambulated 13' with RW and CAM boot, no loss of balance, pt reported shifting in the ankle as well as audible clicking, Dr. Edna notified via secure chat. SpO2 89% on room air walking, 93% on room air after 1 minute seated rest. Reviewed RLE HEP.     If plan is discharge home, recommend the following: A little help with bathing/dressing/bathroom;Assistance with cooking/housework;Help with stairs or ramp for entrance;Assist for transportation   Can travel by private vehicle     Yes  Equipment Recommendations  None recommended by PT    Recommendations for Other Services       Precautions / Restrictions Precautions Precautions: Fall Recall of Precautions/Restrictions: Intact Required Braces or Orthoses: Other Brace Splint/Cast: Right ankle CAM boot Restrictions Weight Bearing Restrictions Per Provider Order: Yes RLE Weight Bearing Per Provider Order: Partial weight bearing RLE Partial Weight Bearing Percentage or Pounds: 50% Other Position/Activity Restrictions: CAM boot to be worn whenever OOB     Mobility  Bed Mobility Overal bed mobility: Modified Independent             General bed mobility comments: HOB up, used rail    Transfers Overall  transfer level: Modified independent Equipment used: Rolling walker (2 wheels)   Sit to Stand: Modified independent (Device/Increase time)                Ambulation/Gait Ambulation/Gait assistance: Supervision Gait Distance (Feet): 50 Feet Assistive device: Rolling walker (2 wheels) Gait Pattern/deviations: Step-to pattern, Decreased step length - right, Decreased step length - left Gait velocity: decreased     General Gait Details: steady, no loss of balance, VCs for proximity to RW, SpO2 89% room air walking, 2/4 dyspnea; pt reported shifting of things in ankle and noticed an audible clicking with walking, MD notified, MD to order xray   Stairs             Wheelchair Mobility     Tilt Bed    Modified Rankin (Stroke Patients Only)       Balance Overall balance assessment: Needs assistance Sitting-balance support: No upper extremity supported, Feet unsupported Sitting balance-Leahy Scale: Good     Standing balance support: Bilateral upper extremity supported Standing balance-Leahy Scale: Fair                              Hotel Manager: No apparent difficulties  Cognition Arousal: Alert Behavior During Therapy: WFL for tasks assessed/performed                             Following commands: Intact      Cueing Cueing Techniques: Verbal cues  Exercises General Exercises -  Lower Extremity Quad Sets: AROM, Right, 5 reps, Supine Gluteal Sets: AROM, Both, 5 reps, Seated Straight Leg Raises: AROM, Right, Other reps (comment), Limitations Straight Leg Raises Limitations: 1 rep    General Comments        Pertinent Vitals/Pain Pain Assessment Pain Score: 0-No pain    Home Living                          Prior Function            PT Goals (current goals can now be found in the care plan section) Acute Rehab PT Goals Patient Stated Goal: be able to walk PT Goal Formulation: With  patient Time For Goal Achievement: 05/18/24 Potential to Achieve Goals: Good Progress towards PT goals: Progressing toward goals    Frequency    Min 3X/week      PT Plan      Co-evaluation              AM-PAC PT 6 Clicks Mobility   Outcome Measure  Help needed turning from your back to your side while in a flat bed without using bedrails?: None Help needed moving from lying on your back to sitting on the side of a flat bed without using bedrails?: None Help needed moving to and from a bed to a chair (including a wheelchair)?: None Help needed standing up from a chair using your arms (e.g., wheelchair or bedside chair)?: None Help needed to walk in hospital room?: None Help needed climbing 3-5 steps with a railing? : A Little 6 Click Score: 23    End of Session Equipment Utilized During Treatment: Gait belt Activity Tolerance: Patient tolerated treatment well Patient left: in chair;with call bell/phone within reach Nurse Communication: Mobility status;Other (comment);Weight bearing status (L abdominal weeping, pt had BM) PT Visit Diagnosis: Difficulty in walking, not elsewhere classified (R26.2)     Time: 8650-8580 PT Time Calculation (min) (ACUTE ONLY): 30 min  Charges:    $Gait Training: 8-22 mins $Therapeutic Activity: 8-22 mins PT General Charges $$ ACUTE PT VISIT: 1 Visit                    Sylvan Delon Copp PT 04/08/2024  Acute Rehabilitation Services  Office [phone]

## 2024-04-08 NOTE — Plan of Care (Signed)
  Problem: Education: Goal: Knowledge of General Education information will improve Description: Including pain rating scale, medication(s)/side effects and non-pharmacologic comfort measures Outcome: Progressing   Problem: Health Behavior/Discharge Planning: Goal: Ability to manage health-related needs will improve Outcome: Progressing   Problem: Clinical Measurements: Goal: Diagnostic test results will improve Outcome: Progressing Goal: Respiratory complications will improve Outcome: Progressing   Problem: Nutrition: Goal: Adequate nutrition will be maintained Outcome: Progressing   Problem: Coping: Goal: Level of anxiety will decrease Outcome: Progressing   Problem: Pain Managment: Goal: General experience of comfort will improve and/or be controlled Outcome: Progressing   Problem: Skin Integrity: Goal: Risk for impaired skin integrity will decrease Outcome: Progressing

## 2024-04-08 NOTE — Progress Notes (Signed)
 PROGRESS NOTE  Thomas [name]  FMW:[id number] DOB: 22-Oct-1975 DOA: 04/05/2024 PCP: Berneta Elsie Sayre, MD   Brief Narrative: Patient is a 48 year old male with history of psoriatic arthritis, psoriasis, alcoholic liver cirrhosis, obesity, recent infection of the hardware on the right ankle status post removal of the hardware and currently on IV antibiotics through PICC line following with Dr. Montine who presented with complaint of shortness of breath.  Patient was at PCPs office who noticed that he had severely diminished sounds on the right side and was sent to the emergency department.  Patient has a history of hepatic hydrothorax and has undergone thoracentesis several times.  On presentation, he was hemodynamically stable.  Chest imaging showed large right pleural effusion.  IR consulted.  Underwent thoracentesis with removal of 1.7 L of pleural fluid.  Patient lives alone, ambulates with the help of wheelchair, cannot bear weight on the right ankle and has poor support at home and has been having difficulty on administration of antibiotic.  PT consulted and recommended SNF.  TOC consulted .  Medically stable for discharge whenever possible.  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  Has underwent thoracentesis in the past.  Presented with right pleural effusion.  IR did thoracentesis with removal of 1.7 L of pleural fluid.  Was placed on oxygen on 3 L/min this morning,we will monitor him on room air  Volume overload/decompensated  alcoholic cirrhosis: Take spironolactone , Lasix  at home.  Currently on IV Lasix . .  Currently not taking alcohol.  Will continue IV Lasix  until his kidney function is preserved.  Elevated bilirubin/mild elevated liver enzymes: Likely secondary to liver cirrhosis.  Follows with [facility].We recommend to follow-up with [facility] as an outpatient.  Elevated ammonia level: Report of being confused at home.  Currently alert and oriented.  Continue lactulose  Infected right ankle hardware: History of recent infection of the hardware on the right ankle status post removal of the hardware and currently on IV antibiotics through PICC line following with Dr. Montine. Dr.Marchwai saw him here , removed some sutures, recommended 50% weightbearing.  Continue DVT prophylaxis with aspirin for 4 weeks on discharge.  Follow-up with ID and orthopedics as an outpatient.   Psoriatic arthritis: Was previously on DMARD but currently on hold due to infection of the right ankle  Normocytic anemia/thrombocytopenia: No evidence of acute blood loss.  Likely associated  with liver cirrhosis.  Currently stable  Hypokalemia: Supplemented with potassium  Obesity: BMI of 42.9  Debility/deconditioning: Patient lives alone, ambulates with the help of wheelchair, cannot bear weight on the right ankle and has poor support at home and has been having difficulty on administration of antibiotic.  PT consulted and recommended SNF.  TOC consulted         DVT prophylaxis:     Code Status: Full Code  Family Communication: None at the bedside  Patient status: Inpatient  Patient is from :home  Anticipated discharge to:SNF  Estimated DC date: Whenever possible   Consultants: None  Procedures:  thoracentesis  Antimicrobials:  Anti-infectives (From admission, onward)    Start     Dose/Rate Route Frequency Ordered Stop   04/05/24 2200  ceFAZolin  (ANCEF ) IVPB 2g/100 mL premix  Status:  Discontinued        2 g 200  mL/hr over 30 Minutes Intravenous Every 8 hours 04/05/24 1800 04/05/24 1811   04/05/24 1715  ceFAZolin  (ANCEF ) IVPB 2g/100 mL premix       Note to Pharmacy: Indication:  Right Ankle Osteomyelitis First Dose: Yes Last Day of Therapy:  05/02/2024 Labs - Once weekly:  CBC/D and BMP, Labs - Once weekly: ESR and CRP Method of administration: IV Push   2 g 200 mL/hr over 30 Minutes Intravenous Every 8 hours 04/05/24 1627         Subjective: Patient seen and examined at bedside today.  Hemodynamically stable.  Comfortable.  He was put on 3 L of oxygen last night.  Does not complain of any shortness of breath or cough.  Lower extremity edema improving.  We discussed about to continue IV Lasix  for today  Objective: Vitals:   04/07/24 1447 04/07/24 1924 04/08/24 0500 04/08/24 0519  BP: (!) 110/59 135/62  127/70  Pulse: 69 73  75  Resp: 16 18  19   Temp: 98.3 F (36.8 C) 98.2 F (36.8 C)  97.9 F (36.6 C)  TempSrc:      SpO2: 95% 97%  90%  Weight:   119.6 kg   Height:        Intake/Output Summary (Last 24 hours) at 04/08/2024 1049 Last data filed at 04/08/2024 0550 Gross per 24 hour  Intake 110 ml  Output 1000 ml  Net -890 ml   Filed Weights   04/05/24 1814 04/07/24 0500 04/08/24 0500  Weight: 117.1 kg 122.3 kg 119.6 kg    Examination:       General exam: Overall comfortable, not in distress, morbidly obese HEENT: PERRL Respiratory system:  no wheezes or crackles, diminished air sound  on the right side Cardiovascular system: S1 & S2 heard, RRR.  Gastrointestinal system: Abdomen is nondistended, soft and nontender. Central nervous system: Alert and oriented Extremities: trace bilateral lower extremity pitting edema, no clubbing ,no cyanosis, right ankle covered in dressing Skin: No rashes, no ulcers,no icterus     Data Reviewed: I have personally reviewed following labs and imaging studies  CBC: Recent Labs  Lab 04/05/24 1331 04/05/24 1836 04/06/24 0100  04/07/24 0045  WBC 5.2 4.9 5.9 6.1  NEUTROABS 3.5 3.2  --   --   HGB 10.4* 9.7* 9.1* 9.9*  HCT 33.3* 31.0* 29.6* 31.1*  MCV 103.4* 103.0* 103.1* 103.3*  PLT 110* 103* 108* 108*   Basic Metabolic Panel: Recent Labs  Lab 04/05/24 1331 04/05/24 1836 04/06/24 0100 04/07/24 0045 04/08/24 0123  NA 138 140 138 140 141  K 3.9 3.9 3.7 3.6 3.4*  CL 103 103 103 103 102  CO2 31 30 32 31 32  GLUCOSE 80 86 91 112* 92  BUN 28* 26* 25* 21* 19  CREATININE 0.77 0.86 0.86 1.01 0.93  CALCIUM 8.6* 8.5* 8.2* 8.0* 8.1*  MG  --   --  1.7  --   --      Recent Results (from the past 240 hours)  Resp panel by RT-PCR (RSV, Flu A&B, Covid) Anterior Nasal Swab     Status: None   Collection Time: 04/05/24  1:13 PM   Specimen: Anterior Nasal Swab  Result Value Ref Range Status  SARS Coronavirus 2 by RT PCR NEGATIVE NEGATIVE Final    Comment: (NOTE) SARS-CoV-2 target nucleic acids are NOT DETECTED.  The SARS-CoV-2 RNA is generally detectable in upper respiratory specimens during the acute phase of infection. The lowest concentration of SARS-CoV-2 viral copies this assay can detect is 138 copies/mL. A negative result does not preclude SARS-Cov-2 infection and should not be used as the sole basis for treatment or other patient management decisions. A negative result may occur with  improper specimen collection/handling, submission of specimen other than nasopharyngeal swab, presence of viral mutation(s) within the areas targeted by this assay, and inadequate number of viral copies(<138 copies/mL). A negative result must be combined with clinical observations, patient history, and epidemiological information. The expected result is Negative.  Fact Sheet for Patients:  bloggercourse.com  Fact Sheet for Healthcare Providers:  seriousbroker.it  This test is no t yet approved or cleared by the [place]  FDA and  has been authorized for detection  and/or diagnosis of SARS-CoV-2 by FDA under an Emergency Use Authorization (EUA). This EUA will remain  in effect (meaning this test can be used) for the duration of the COVID-19 declaration under Section 564(b)(1) of the Act, 21 U.S.C.section 360bbb-3(b)(1), unless the authorization is terminated  or revoked sooner.       Influenza A by PCR NEGATIVE NEGATIVE Final   Influenza B by PCR NEGATIVE NEGATIVE Final    Comment: (NOTE) The Xpert Xpress SARS-CoV-2/FLU/RSV plus assay is intended as an aid in the diagnosis of influenza from Nasopharyngeal swab specimens and should not be used as a sole basis for treatment. Nasal washings and aspirates are unacceptable for Xpert Xpress SARS-CoV-2/FLU/RSV testing.  Fact Sheet for Patients: bloggercourse.com  Fact Sheet for Healthcare Providers: seriousbroker.it  This test is not yet approved or cleared by the [place]  FDA and has been authorized for detection and/or diagnosis of SARS-CoV-2 by FDA under an Emergency Use Authorization (EUA). This EUA will remain in effect (meaning this test can be used) for the duration of the COVID-19 declaration under Section 564(b)(1) of the Act, 21 U.S.C. section 360bbb-3(b)(1), unless the authorization is terminated or revoked.     Resp Syncytial Virus by PCR NEGATIVE NEGATIVE Final    Comment: (NOTE) Fact Sheet for Patients: bloggercourse.com  Fact Sheet for Healthcare Providers: seriousbroker.it  This test is not yet approved or cleared by the [place]  FDA and has been authorized for detection and/or diagnosis of SARS-CoV-2 by FDA under an Emergency Use Authorization (EUA). This EUA will remain in effect (meaning this test can be used) for the duration of the COVID-19 declaration under Section 564(b)(1) of the Act, 21 U.S.C. section 360bbb-3(b)(1), unless the authorization is terminated  or revoked.  Performed at [facility], [address] [place], [place] [address]   Body fluid culture w Gram Stain     Status: None (Preliminary result)   Collection Time: 04/05/24  4:46 PM   Specimen: Pleural, Right; Body Fluid  Result Value Ref Range Status   Specimen Description   Final    Pleural R Performed at [facility], [address], [place], [place] [address]    Special Requests   Final    NONE Performed at [facility], [address] [place], [place] [address]    Gram Stain NO WBC SEEN NO ORGANISMS SEEN   Final   Culture   Final    NO GROWTH 3 DAYS Performed at [facility],  [address], [place], [place] [address]    Report Status PENDING  Incomplete     Radiology Studies: No results found.   Scheduled Meds:  Chlorhexidine  Gluconate Cloth  6 each Topical Daily   clobetasol  cream   Topical BID   feeding supplement  237 mL Oral BID BM   folic acid   1 mg Oral Q1200   furosemide   40 mg Intravenous BID   lactulose  20 g Oral TID   loteprednol  1 drop Both Eyes QID   nadolol   20 mg Oral Daily   pantoprazole   40 mg Oral BID   sodium chloride  flush  10-40 mL Intracatheter Q12H   spironolactone   25 mg Oral Daily   traMADol   50 mg Oral Once   Continuous Infusions:  ceFAZolin  2 g (04/08/24 0533)     LOS: 3 days   Ivonne Mustache, MD [facility] P10/31/2025, 10:49 AM

## 2024-04-09 DIAGNOSIS — J90 Pleural effusion, not elsewhere classified: Secondary | ICD-10-CM | POA: Diagnosis not present

## 2024-04-09 LAB — BODY FLUID CULTURE W GRAM STAIN
Culture: NO GROWTH
Gram Stain: NONE SEEN

## 2024-04-09 LAB — BASIC METABOLIC PANEL WITH GFR
Anion gap: 4 — ABNORMAL LOW (ref 5–15)
BUN: 19 mg/dL (ref 6–20)
CO2: 33 mmol/L — ABNORMAL HIGH (ref 22–32)
Calcium: 8.1 mg/dL — ABNORMAL LOW (ref 8.9–10.3)
Chloride: 101 mmol/L (ref 98–111)
Creatinine, Ser: 0.82 mg/dL (ref 0.61–1.24)
GFR, Estimated: >60 mL/min (ref >60)
Glucose, Bld: 96 mg/dL (ref 70–99)
Potassium: 4 mmol/L (ref 3.5–5.1)
Sodium: 138 mmol/L (ref 135–145)

## 2024-04-09 MED ORDER — FUROSEMIDE 10 MG/ML IJ SOLN
60.0000 mg | Freq: Two times a day (BID) | INTRAMUSCULAR | Status: DC
  Administered 2024-04-09 – 2024-04-11 (×6): 60 mg via INTRAVENOUS
  Filled 2024-04-09 (×6): qty 6

## 2024-04-09 NOTE — Plan of Care (Signed)
  Problem: Education: Goal: Knowledge of General Education information will improve Description: Including pain rating scale, medication(s)/side effects and non-pharmacologic comfort measures Outcome: Progressing   Problem: Health Behavior/Discharge Planning: Goal: Ability to manage health-related needs will improve Outcome: Progressing   Problem: Clinical Measurements: Goal: Diagnostic test results will improve Outcome: Progressing Goal: Respiratory complications will improve Outcome: Progressing Goal: Cardiovascular complication will be avoided Outcome: Progressing   Problem: Activity: Goal: Risk for activity intolerance will decrease Outcome: Progressing   Problem: Nutrition: Goal: Adequate nutrition will be maintained Outcome: Progressing   Problem: Coping: Goal: Level of anxiety will decrease Outcome: Progressing   Problem: Elimination: Goal: Will not experience complications related to bowel motility Outcome: Progressing Goal: Will not experience complications related to urinary retention Outcome: Progressing   Problem: Pain Managment: Goal: General experience of comfort will improve and/or be controlled Outcome: Progressing   Problem: Safety: Goal: Ability to remain free from injury will improve Outcome: Progressing   Problem: Skin Integrity: Goal: Risk for impaired skin integrity will decrease Outcome: Progressing   Problem: Clinical Measurements: Goal: Will remain free from infection Outcome: Not Progressing

## 2024-04-09 NOTE — TOC Progression Note (Addendum)
 Transition of Care [facility]) - Progression Note    Patient Details  Name: Thomas [name] MRN: [id number] Date of Birth: 12/31/1975  Transition of Care [facility]) CM/SW Contact  Sonda Manuella Quill, RN Phone Number: 04/09/2024, 9:05 AM  Clinical Narrative:    Referrals for HHPT declined by [facility], and [facility]; spoke w/ Greig Cedar at [facility]; she said agency cannot provide services; LVM for Jon at [facility]; awaiting return call.  -[phone]- return call from Graham at [facility]; she said agency cannot provide service b/c out of network; unable to secure [facility] agency; Dr Jillian notified via secure chat.  Expected Discharge Plan: Home w Home Health Services Barriers to Discharge: Continued Medical Work up               Expected Discharge Plan and Services In-house Referral: Clinical Social Work Discharge Planning Services: NA Post Acute Care Choice: Home Health Living arrangements for the past 2 months: Single Family Home                 DME Arranged: N/A DME Agency: NA       HH Arranged: PT, OT, RN HH Agency: [facility], [facility] Date HH Agency Contacted: 04/06/24 Time HH Agency Contacted: [phone] Representative spoke with at [facility] Agency: Cindie at [facility] / Pam at [facility]   Social Drivers of Health (SDOH) Interventions SDOH Screenings   Food Insecurity: No Food Insecurity (04/05/2024)  Housing: Low Risk  (04/05/2024)  Transportation Needs: No Transportation Needs (04/05/2024)  Utilities: Not At Risk (04/05/2024)  Depression (PHQ2-9): Low Risk  (03/31/2024)  Financial Resource Strain: Low Risk  (11/26/2023)  Physical Activity: Insufficiently Active (11/26/2023)  Social Connections: Socially Isolated (11/26/2023)  Stress: No Stress Concern Present (11/26/2023)  Tobacco Use: High Risk (04/05/2024)    Readmission Risk Interventions    04/06/2024   11:43 AM 03/23/2024    3:12 PM  Readmission Risk Prevention Plan  Transportation Screening Complete  Complete  PCP or Specialist Appt within 5-7 Days  Complete  Home Care Screening  Complete  Medication Review (RN CM)  Complete  Medication Review (RN Care Manager) Complete   PCP or Specialist appointment within 3-5 days of discharge Complete   HRI or Home Care Consult Complete   SW Recovery Care/Counseling Consult Complete   Palliative Care Screening Not Applicable   Skilled Nursing Facility Not Applicable

## 2024-04-09 NOTE — Progress Notes (Signed)
 PROGRESS NOTE  BRITTANY AMIRAULT  FMW:[id number] DOB: 02/11/1976 DOA: 04/05/2024 PCP: Berneta Elsie Sayre, MD   Brief Narrative: Patient is a 48 year old male with history of psoriatic arthritis, psoriasis, alcoholic liver cirrhosis, obesity, recent infection of the hardware on the right ankle status post removal of the hardware and currently on IV antibiotics through PICC line following with Dr. Montine who presented with complaint of shortness of breath.  Patient was at PCPs office who noticed that he had severely diminished sounds on the right side and was sent to the emergency department.  Patient has a history of hepatic hydrothorax and has undergone thoracentesis several times.  On presentation, he was hemodynamically stable.  Chest imaging showed large right pleural effusion.  IR consulted.  Underwent thoracentesis with removal of 1.7 L of pleural fluid.  Patient lives alone, ambulates with the help of wheelchair, cannot bear weight on the right ankle and has poor support at home and has been having difficulty on administration of antibiotic.  PT consulted and recommended HH.  Continues to be hypervolemic.  Increased the dose of Lasix  to 60 mg twice daily.  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  Has underwent thoracentesis in the past.  Presented with right pleural effusion.  IR did thoracentesis with removal of 1.7 L of pleural fluid.  We will monitor him on room air  Volume overload/decompensated alcoholic cirrhosis: Take  spironolactone , Lasix  at home.  Currently on IV Lasix . .  Currently not taking alcohol.  Will continue IV Lasix  until his kidney function is preserved.Increased dose to 60 mg BID  Elevated bilirubin/mild elevated liver enzymes: Likely secondary to liver cirrhosis.  Follows with [facility].We recommend to follow-up with [facility] as an outpatient.  Liver enzymes/bilirubin improved  Elevated ammonia level: Report of being confused at home.  Currently alert and oriented.  Continue lactulose  Infected right ankle hardware: History of recent infection of the hardware on the right ankle status post removal of the hardware and currently on IV antibiotics through PICC line following with Dr. Montine. Dr.Marchwai saw him here , removed some sutures, recommended 50% weightbearing.  Continue DVT prophylaxis with aspirin for 4 weeks on discharge.  Follow-up with ID and orthopedics as an outpatient.   Psoriatic arthritis: Was previously on DMARD but currently on hold due to infection of the right ankle  Normocytic anemia/thrombocytopenia: No evidence of acute blood loss.  Likely associated  with liver cirrhosis.  Currently stable  Hypokalemia: Supplemented with potassium and corrected  Obesity: BMI of 42.9  Debility/deconditioning: Patient lives alone, ambulates with the help of wheelchair, cannot bear weight on the right ankle and has poor support at home and has been having difficulty on administration of antibiotic.  PT recommended HH        DVT prophylaxis:     Code Status: Full Code  Family Communication: None at the bedside  Patient status: Inpatient  Patient is from :home  Anticipated discharge to:HH  Estimated DC date: When he remains euvolemic,may take 2-3 days  Consultants: None  Procedures:  thoracentesis  Antimicrobials:  Anti-infectives (From admission, onward)    Start     Dose/Rate Route Frequency Ordered Stop   04/05/24 2200  ceFAZolin  (ANCEF ) IVPB 2g/100 mL  premix  Status:  Discontinued        2 g 200 mL/hr over 30 Minutes Intravenous Every 8 hours 04/05/24 1800 04/05/24 1811   04/05/24 1715  ceFAZolin  (ANCEF ) IVPB 2g/100 mL premix       Note to Pharmacy: Indication:  Right Ankle Osteomyelitis First Dose: Yes Last Day of Therapy:  05/02/2024 Labs - Once weekly:  CBC/D and BMP, Labs - Once weekly: ESR and CRP Method of administration: IV Push   2 g 200 mL/hr over 30 Minutes Intravenous Every 8 hours 04/05/24 1627         Subjective: Patient seen and examined at bedside today.  Hemodynamically stable.  Comfortably lying in bed.  No new complaints but still appears hypervolemic.  Has severe bilateral pitting lower extremity edema, no wall edema.  We discussed about increasing the dose of Lasix   Objective: Vitals:   04/08/24 1425 04/08/24 1431 04/08/24 1940 04/09/24 0456  BP: (!) 101/57  127/66 125/70  Pulse: 77  73 78  Resp: 15  16 18   Temp: 98.6 F (37 C)  98.5 F (36.9 C) 97.9 F (36.6 C)  TempSrc:   Oral   SpO2: 94% (!) 89% 95% 95%  Weight:    120.6 kg  Height:        Intake/Output Summary (Last 24 hours) at 04/09/2024 1056 Last data filed at 04/08/2024 2227 Gross per 24 hour  Intake 10 ml  Output 1600 ml  Net -1590 ml   Filed Weights   04/07/24 0500 04/08/24 0500 04/09/24 0456  Weight: 122.3 kg 119.6 kg 120.6 kg    Examination:      General exam: Overall comfortable, not in distress,morbidly obese HEENT: PERRL Respiratory system:  no wheezes or crackles , diminished sound on the right side Cardiovascular system: S1 & S2 heard, RRR.  Gastrointestinal system: Abdomen is nondistended, soft and nontender.  Abdominal wall edema Central nervous system: Alert and oriented Extremities:bilateral lower extremity pitting edema, no clubbing ,no cyanosis, right ankle covered in dressing Skin: No rashes, no ulcers,no icterus      Data Reviewed: I have personally reviewed following labs and imaging studies  CBC: Recent  Labs  Lab 04/05/24 1331 04/05/24 1836 04/06/24 0100 04/07/24 0045  WBC 5.2 4.9 5.9 6.1  NEUTROABS 3.5 3.2  --   --   HGB 10.4* 9.7* 9.1* 9.9*  HCT 33.3* 31.0* 29.6* 31.1*  MCV 103.4* 103.0* 103.1* 103.3*  PLT 110* 103* 108* 108*   Basic Metabolic Panel: Recent Labs  Lab 04/05/24 1836 04/06/24 0100 04/07/24 0045 04/08/24 0123 04/09/24 0330  NA 140 138 140 141 138  K 3.9 3.7 3.6 3.4* 4.0  CL 103 103 103 102 101  CO2 30 32 31 32 33*  GLUCOSE 86 91 112* 92 96  BUN 26* 25* 21* 19 19  CREATININE 0.86 0.86 1.01 0.93 0.82  CALCIUM 8.5* 8.2* 8.0* 8.1* 8.1*  MG  --  1.7  --   --   --      Recent Results (from the past 240 hours)  Resp panel by RT-PCR (RSV, Flu A&B, Covid) Anterior Nasal Swab     Status: None   Collection Time: 04/05/24  1:13 PM   Specimen: Anterior Nasal Swab  Result Value Ref Range Status  SARS Coronavirus 2 by RT PCR NEGATIVE NEGATIVE Final    Comment: (NOTE) SARS-CoV-2 target nucleic acids are NOT DETECTED.  The SARS-CoV-2 RNA is generally detectable in upper respiratory specimens during the acute phase of infection. The lowest concentration of SARS-CoV-2 viral copies this assay can detect is 138 copies/mL. A negative result does not preclude SARS-Cov-2 infection and should not be used as the sole basis for treatment or other patient management decisions. A negative result may occur with  improper specimen collection/handling, submission of specimen other than nasopharyngeal swab, presence of viral mutation(s) within the areas targeted by this assay, and inadequate number of viral copies(<138 copies/mL). A negative result must be combined with clinical observations, patient history, and epidemiological information. The expected result is Negative.  Fact Sheet for Patients:  bloggercourse.com  Fact Sheet for Healthcare Providers:  seriousbroker.it  This test is no t yet approved or cleared by the  [place]  FDA and  has been authorized for detection and/or diagnosis of SARS-CoV-2 by FDA under an Emergency Use Authorization (EUA). This EUA will remain  in effect (meaning this test can be used) for the duration of the COVID-19 declaration under Section 564(b)(1) of the Act, 21 U.S.C.section 360bbb-3(b)(1), unless the authorization is terminated  or revoked sooner.       Influenza A by PCR NEGATIVE NEGATIVE Final   Influenza B by PCR NEGATIVE NEGATIVE Final    Comment: (NOTE) The Xpert Xpress SARS-CoV-2/FLU/RSV plus assay is intended as an aid in the diagnosis of influenza from Nasopharyngeal swab specimens and should not be used as a sole basis for treatment. Nasal washings and aspirates are unacceptable for Xpert Xpress SARS-CoV-2/FLU/RSV testing.  Fact Sheet for Patients: bloggercourse.com  Fact Sheet for Healthcare Providers: seriousbroker.it  This test is not yet approved or cleared by the [place]  FDA and has been authorized for detection and/or diagnosis of SARS-CoV-2 by FDA under an Emergency Use Authorization (EUA). This EUA will remain in effect (meaning this test can be used) for the duration of the COVID-19 declaration under Section 564(b)(1) of the Act, 21 U.S.C. section 360bbb-3(b)(1), unless the authorization is terminated or revoked.     Resp Syncytial Virus by PCR NEGATIVE NEGATIVE Final    Comment: (NOTE) Fact Sheet for Patients: bloggercourse.com  Fact Sheet for Healthcare Providers: seriousbroker.it  This test is not yet approved or cleared by the [place]  FDA and has been authorized for detection and/or diagnosis of SARS-CoV-2 by FDA under an Emergency Use Authorization (EUA). This EUA will remain in effect (meaning this test can be used) for the duration of the COVID-19 declaration under Section 564(b)(1) of the Act, 21 U.S.C. section  360bbb-3(b)(1), unless the authorization is terminated or revoked.  Performed at [facility], [address], [place], [place] [address]   Body fluid culture w Gram Stain     Status: None (Preliminary result)   Collection Time: 04/05/24  4:46 PM   Specimen: Pleural, Right; Body Fluid  Result Value Ref Range Status   Specimen Description   Final    Pleural R Performed at [facility], [address] [place], [place] [address]    Special Requests   Final    NONE Performed at [facility], [address], [place], [place] [address]    Gram Stain NO WBC SEEN NO ORGANISMS SEEN   Final   Culture   Final    NO GROWTH 3 DAYS Performed at [facility],  [address], [place], [place] [address]    Report Status PENDING  Incomplete     Radiology Studies: DG Ankle 2 Views Right Result Date: 04/08/2024 CLINICAL DATA:  Irrigation and debridement of right ankle infection, hardware removal EXAM: DG ANKLE 2V *R* COMPARISON:  03/20/2024 FINDINGS: Frontal and lateral views of the right ankle are obtained. The plate and screw fixation across the distal fibula has been removed in the interim. Remaining orthopedic hardware is unremarkable. There are no acute displaced fractures. Stable osteoarthritis of the ankle and hindfoot. Diffuse soft tissue swelling throughout the visualized ankle and foot. IMPRESSION: 1. No complication after fibular hardware removal. 2. Diffuse soft tissue swelling. 3. Stable multifocal osteoarthritis. Electronically Signed   By: Ozell Daring M.D.   On: 04/08/2024 21:04     Scheduled Meds:  Chlorhexidine  Gluconate Cloth  6 each Topical Daily   clobetasol  cream   Topical BID   feeding supplement  237 mL Oral BID BM   folic acid   1 mg Oral Q1200   furosemide   60 mg Intravenous BID   lactulose  20 g Oral TID   loteprednol  1 drop Both Eyes QID   nadolol   20 mg Oral Daily   pantoprazole   40 mg Oral BID    sodium chloride  flush  10-40 mL Intracatheter Q12H   spironolactone   25 mg Oral Daily   traMADol   50 mg Oral Once   Continuous Infusions:  ceFAZolin  2 g (04/09/24 0545)     LOS: 4 days   Ivonne Mustache, MD [facility] P11/06/2023, 10:56 AM

## 2024-04-09 NOTE — Plan of Care (Signed)

## 2024-04-10 ENCOUNTER — Inpatient Hospital Stay (HOSPITAL_COMMUNITY)

## 2024-04-10 DIAGNOSIS — J90 Pleural effusion, not elsewhere classified: Secondary | ICD-10-CM | POA: Diagnosis not present

## 2024-04-10 DIAGNOSIS — K7030 Alcoholic cirrhosis of liver without ascites: Secondary | ICD-10-CM | POA: Diagnosis not present

## 2024-04-10 DIAGNOSIS — E7220 Disorder of urea cycle metabolism, unspecified: Secondary | ICD-10-CM

## 2024-04-10 LAB — COMPREHENSIVE METABOLIC PANEL WITH GFR
ALT: 14 U/L (ref 0–44)
AST: 112 U/L — ABNORMAL HIGH (ref 15–41)
Albumin: 2.3 g/dL — ABNORMAL LOW (ref 3.5–5.0)
Alkaline Phosphatase: 245 U/L — ABNORMAL HIGH (ref 38–126)
Anion gap: 4 — ABNORMAL LOW (ref 5–15)
BUN: 17 mg/dL (ref 6–20)
CO2: 34 mmol/L — ABNORMAL HIGH (ref 22–32)
Calcium: 8.3 mg/dL — ABNORMAL LOW (ref 8.9–10.3)
Chloride: 98 mmol/L (ref 98–111)
Creatinine, Ser: 0.82 mg/dL (ref 0.61–1.24)
GFR, Estimated: >60 mL/min (ref >60)
Glucose, Bld: 99 mg/dL (ref 70–99)
Potassium: 3.7 mmol/L (ref 3.5–5.1)
Sodium: 136 mmol/L (ref 135–145)
Total Bilirubin: 3 mg/dL — ABNORMAL HIGH (ref 0.0–1.2)
Total Protein: 6.2 g/dL — ABNORMAL LOW (ref 6.5–8.1)

## 2024-04-10 MED ORDER — SPIRONOLACTONE 100 MG PO TABS
100.0000 mg | ORAL_TABLET | Freq: Every day | ORAL | Status: DC
  Administered 2024-04-10 – 2024-04-12 (×3): 100 mg via ORAL
  Filled 2024-04-10 (×3): qty 1

## 2024-04-10 NOTE — Progress Notes (Signed)
 PROGRESS NOTE  Thomas [name]  FMW:[id number] DOB: 15-Feb-1976 DOA: 04/05/2024 PCP: Berneta Elsie Sayre, MD   Brief Narrative: Patient is a 48 year old male with history of psoriatic arthritis, psoriasis, alcoholic liver cirrhosis, obesity, recent infection of the hardware on the right ankle status post removal of the hardware and currently on IV antibiotics through PICC line following with Dr. Montine who presented with complaint of shortness of breath.  Patient was at PCPs office who noticed that he had severely diminished sounds on the right side and was sent to the emergency department.  Patient has a history of hepatic hydrothorax and has undergone thoracentesis several times.  On presentation, he was hemodynamically stable.  Chest imaging showed large right pleural effusion.  IR consulted.  Underwent thoracentesis with removal of 1.7 L of pleural fluid.  Patient lives alone, ambulates with the help of wheelchair, cannot bear weight on the right ankle and has poor support at home and has been having difficulty on administration of antibiotic.  PT consulted and recommended HH.  Continues to be hypervolemic.  Increased the dose of Lasix  to 60 mg twice daily.  Chest x-ray follow-up today shows increased right pleural effusion with almost complete atelectasis of the right lung.  PCCM consulted.  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  Has underwent thoracentesis in the past.  Presented with right pleural effusion.  IR did thoracentesis with  removal of 1.7 L of pleural fluid.  Tolerating room air but occasionally becomes winded.   Chest x-ray follow-up today shows increased right pleural effusion with almost complete atelectasis of the right lung.  PCCM consulted.  Volume overload/decompensated alcoholic cirrhosis: Take spironolactone , Lasix  at home.  Currently on IV Lasix . .  Currently not taking alcohol.  Will continue IV Lasix  until his kidney function is preserved.continue 60 mg BID  Elevated bilirubin/mild elevated liver enzymes: Likely secondary to liver cirrhosis.  Follows with [facility].We recommend to follow-up with [facility] as an outpatient.  Liver enzymes/bilirubin improved  Elevated ammonia level: Report of being confused at home.  Currently alert and oriented.  Continue lactulose  Infected right ankle hardware: History of recent infection of the hardware on the right ankle status post removal of the hardware and currently on IV antibiotics through PICC line following with Dr. Montine. Dr.Marchwai saw him here , removed some sutures, recommended 50% weightbearing.  Continue DVT prophylaxis with aspirin for 4 weeks on discharge.  Follow-up with ID and orthopedics as an outpatient.   Psoriatic arthritis: Was previously on DMARD but currently on hold due to infection of the right ankle  Normocytic anemia/thrombocytopenia: No evidence of acute blood loss.  Likely associated  with liver cirrhosis.  Currently stable  Hypokalemia: Supplemented with potassium and corrected  Obesity: BMI of 42.9  Debility/deconditioning: Patient lives alone, ambulates with the help of wheelchair, cannot bear weight on the right ankle and has poor support at home and has been having difficulty on administration of antibiotic.  PT recommended [facility]  DVT prophylaxis:     Code Status: Full Code  Family Communication: None at the bedside  Patient status: Inpatient  Patient is from :home  Anticipated discharge to:HH  Estimated  DC date: After pulmonology clearance, after he becomes euvolemic.   Consultants: None  Procedures:  thoracentesis  Antimicrobials:  Anti-infectives (From admission, onward)    Start     Dose/Rate Route Frequency Ordered Stop   04/05/24 2200  ceFAZolin  (ANCEF ) IVPB 2g/100 mL premix  Status:  Discontinued        2 g 200 mL/hr over 30 Minutes Intravenous Every 8 hours 04/05/24 1800 04/05/24 1811   04/05/24 1715  ceFAZolin  (ANCEF ) IVPB 2g/100 mL premix       Note to Pharmacy: Indication:  Right Ankle Osteomyelitis First Dose: Yes Last Day of Therapy:  05/02/2024 Labs - Once weekly:  CBC/D and BMP, Labs - Once weekly: ESR and CRP Method of administration: IV Push   2 g 200 mL/hr over 30 Minutes Intravenous Every 8 hours 04/05/24 1627         Subjective: Patient seen and examined at bedside today.  Hemodynamically stable.  On room air.  Lower extremity edema improving but he still appears volume overloaded.  He has been on room air since last several days but occasionally becomes short of breath and has some cough.  Chest x-ray done today showed increased right pleural effusion, almost complete atelectasis.  Objective: Vitals:   04/09/24 1328 04/09/24 2007 04/10/24 0500 04/10/24 0520  BP: 132/67 118/61  106/66  Pulse: 82 78  76  Resp: 20 20  (!) 22  Temp: 98.3 F (36.8 C) 98.6 F (37 C)  98.7 F (37.1 C)  TempSrc:      SpO2: 97% 92%  91%  Weight:   115.3 kg   Height:        Intake/Output Summary (Last 24 hours) at 04/10/2024 1100 Last data filed at 04/10/2024 0956 Gross per 24 hour  Intake 916 ml  Output 2150 ml  Net -1234 ml   Filed Weights   04/08/24 0500 04/09/24 0456 04/10/24 0500  Weight: 119.6 kg 120.6 kg 115.3 kg    Examination:    General exam: Overall comfortable, not in distress,morbidly obese HEENT: PERRL Respiratory system: Diminished air sound on the right side Cardiovascular system: S1 & S2 heard, RRR.  Gastrointestinal system: Abdomen is  nondistended, soft and nontender.  Abdominal wall edema Central nervous system: Alert and oriented Extremities: Bilateral lower extremity pitting edema, no clubbing ,no cyanosis, right ankle covered with dressing Skin: No rashes, no ulcers,no icterus     Data Reviewed: I have personally reviewed following labs and imaging studies  CBC: Recent Labs  Lab 04/05/24 1331 04/05/24 1836 04/06/24 0100 04/07/24 0045  WBC 5.2 4.9 5.9 6.1  NEUTROABS 3.5 3.2  --   --   HGB 10.4* 9.7* 9.1* 9.9*  HCT 33.3* 31.0* 29.6* 31.1*  MCV 103.4* 103.0* 103.1* 103.3*  PLT 110* 103* 108* 108*   Basic Metabolic Panel: Recent Labs  Lab 04/06/24 0100 04/07/24 0045 04/08/24 0123 04/09/24 0330 04/10/24 0344  NA 138 140 141 138 136  K 3.7 3.6 3.4* 4.0 3.7  CL 103 103 102 101 98  CO2 32 31 32 33* 34*  GLUCOSE 91 112* 92 96 99  BUN 25* 21* 19 19 17   CREATININE 0.86 1.01 0.93 0.82 0.82  CALCIUM 8.2* 8.0* 8.1* 8.1* 8.3*  MG 1.7  --   --   --   --  Recent Results (from the past 240 hours)  Resp panel by RT-PCR (RSV, Flu A&B, Covid) Anterior Nasal Swab     Status: None   Collection Time: 04/05/24  1:13 PM   Specimen: Anterior Nasal Swab  Result Value Ref Range Status   SARS Coronavirus 2 by RT PCR NEGATIVE NEGATIVE Final    Comment: (NOTE) SARS-CoV-2 target nucleic acids are NOT DETECTED.  The SARS-CoV-2 RNA is generally detectable in upper respiratory specimens during the acute phase of infection. The lowest concentration of SARS-CoV-2 viral copies this assay can detect is 138 copies/mL. A negative result does not preclude SARS-Cov-2 infection and should not be used as the sole basis for treatment or other patient management decisions. A negative result may occur with  improper specimen collection/handling, submission of specimen other than nasopharyngeal swab, presence of viral mutation(s) within the areas targeted by this assay, and inadequate number of viral copies(<138 copies/mL). A  negative result must be combined with clinical observations, patient history, and epidemiological information. The expected result is Negative.  Fact Sheet for Patients:  bloggercourse.com  Fact Sheet for Healthcare Providers:  seriousbroker.it  This test is no t yet approved or cleared by the [place]  FDA and  has been authorized for detection and/or diagnosis of SARS-CoV-2 by FDA under an Emergency Use Authorization (EUA). This EUA will remain  in effect (meaning this test can be used) for the duration of the COVID-19 declaration under Section 564(b)(1) of the Act, 21 U.S.C.section 360bbb-3(b)(1), unless the authorization is terminated  or revoked sooner.       Influenza A by PCR NEGATIVE NEGATIVE Final   Influenza B by PCR NEGATIVE NEGATIVE Final    Comment: (NOTE) The Xpert Xpress SARS-CoV-2/FLU/RSV plus assay is intended as an aid in the diagnosis of influenza from Nasopharyngeal swab specimens and should not be used as a sole basis for treatment. Nasal washings and aspirates are unacceptable for Xpert Xpress SARS-CoV-2/FLU/RSV testing.  Fact Sheet for Patients: bloggercourse.com  Fact Sheet for Healthcare Providers: seriousbroker.it  This test is not yet approved or cleared by the [place]  FDA and has been authorized for detection and/or diagnosis of SARS-CoV-2 by FDA under an Emergency Use Authorization (EUA). This EUA will remain in effect (meaning this test can be used) for the duration of the COVID-19 declaration under Section 564(b)(1) of the Act, 21 U.S.C. section 360bbb-3(b)(1), unless the authorization is terminated or revoked.     Resp Syncytial Virus by PCR NEGATIVE NEGATIVE Final    Comment: (NOTE) Fact Sheet for Patients: bloggercourse.com  Fact Sheet for Healthcare  Providers: seriousbroker.it  This test is not yet approved or cleared by the [place]  FDA and has been authorized for detection and/or diagnosis of SARS-CoV-2 by FDA under an Emergency Use Authorization (EUA). This EUA will remain in effect (meaning this test can be used) for the duration of the COVID-19 declaration under Section 564(b)(1) of the Act, 21 U.S.C. section 360bbb-3(b)(1), unless the authorization is terminated or revoked.  Performed at [facility], [address] [place], [place] [address]   Body fluid culture w Gram Stain     Status: None   Collection Time: 04/05/24  4:46 PM   Specimen: Pleural, Right; Body Fluid  Result Value Ref Range Status   Specimen Description   Final    Pleural R Performed at [facility], [address] [place], [place] [address]    Special Requests   Final    NONE Performed  at [facility], [address], [place], [place] [address]    Gram Stain NO WBC SEEN NO ORGANISMS SEEN   Final   Culture   Final    NO GROWTH 3 DAYS Performed at [facility], [address], [place], [place] [address]    Report Status 04/09/2024 FINAL  Final     Radiology Studies: DG CHEST PORT 1 VIEW Result Date: 04/10/2024 CLINICAL DATA:  Cough and shortness of breath EXAM: PORTABLE CHEST 1 VIEW COMPARISON:  Chest radiograph dated 03/28/2024 FINDINGS: Lines/tubes: Right upper extremity PICC tip projects over the chest tube. Lungs: Near-complete atelectasis of the right lung. Left lung is well inflated. Insert no focal Pleura: Increased large right pleural effusion.  No pneumothorax. Heart/mediastinum: Right heart border is obscured. Bones: No acute osseous abnormality. IMPRESSION: Increased large right pleural effusion with near-complete atelectasis of the right lung. Electronically Signed   By: Limin  Xu M.D.   On: 04/10/2024 10:33   DG Ankle 2 Views Right Result  Date: 04/08/2024 CLINICAL DATA:  Irrigation and debridement of right ankle infection, hardware removal EXAM: DG ANKLE 2V *R* COMPARISON:  03/20/2024 FINDINGS: Frontal and lateral views of the right ankle are obtained. The plate and screw fixation across the distal fibula has been removed in the interim. Remaining orthopedic hardware is unremarkable. There are no acute displaced fractures. Stable osteoarthritis of the ankle and hindfoot. Diffuse soft tissue swelling throughout the visualized ankle and foot. IMPRESSION: 1. No complication after fibular hardware removal. 2. Diffuse soft tissue swelling. 3. Stable multifocal osteoarthritis. Electronically Signed   By: Ozell Daring M.D.   On: 04/08/2024 21:04     Scheduled Meds:  Chlorhexidine  Gluconate Cloth  6 each Topical Daily   clobetasol  cream   Topical BID   feeding supplement  237 mL Oral BID BM   folic acid   1 mg Oral Q1200   furosemide   60 mg Intravenous BID   lactulose  20 g Oral TID   loteprednol  1 drop Both Eyes QID   nadolol   20 mg Oral Daily   pantoprazole   40 mg Oral BID   sodium chloride  flush  10-40 mL Intracatheter Q12H   spironolactone   25 mg Oral Daily   traMADol   50 mg Oral Once   Continuous Infusions:  ceFAZolin  2 g (04/10/24 0557)     LOS: 5 days   Ivonne Mustache, MD [facility] P11/07/2023, 11:00 AM

## 2024-04-10 NOTE — Plan of Care (Signed)
  Problem: Clinical Measurements: Goal: Ability to maintain clinical measurements within normal limits will improve Outcome: Progressing Goal: Will remain free from infection Outcome: Progressing Goal: Diagnostic test results will improve Outcome: Progressing Goal: Respiratory complications will improve Outcome: Progressing Goal: Cardiovascular complication will be avoided Outcome: Progressing   Problem: Activity: Goal: Risk for activity intolerance will decrease Outcome: Progressing   Problem: Nutrition: Goal: Adequate nutrition will be maintained Outcome: Progressing   Problem: Coping: Goal: Level of anxiety will decrease Outcome: Progressing   Problem: Elimination: Goal: Will not experience complications related to urinary retention Outcome: Progressing   Problem: Pain Managment: Goal: General experience of comfort will improve and/or be controlled Outcome: Progressing   Problem: Safety: Goal: Ability to remain free from injury will improve Outcome: Progressing

## 2024-04-10 NOTE — Plan of Care (Signed)

## 2024-04-10 NOTE — Consult Note (Signed)
 NAME:  Thomas [name], MRN:  [id number], DOB:  June 06, 1976, LOS: 5 ADMISSION DATE:  04/05/2024, CONSULTATION DATE: 04/10/2024 REFERRING MD: Dr. 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 Recently had thoracentesis with reaccumulation of fluid  History of alcoholic liver cirrhosis, psoriatic arthritis, psoriasis, obesity, recent infection of right ankle hardware Was admitted with shortness of breath found to have a large pleural effusion for which he had a thoracentesis Has had thoracentesis multiple times previously  Pertinent  Medical History   Past Medical History:  Diagnosis Date   Acute conjunctivitis of both eyes 10/22/2021   Acute sinusitis 06/16/2013   Alcoholic cirrhosis (HCC)    Alcoholic fatty liver 01/16/2010   Needs final HBV and HAV vaccines on or after 10/25/2012    Alcoholism (HCC) 12/25/2011   Allergic rhinitis    Childhood asthma    Elevated transaminase level 06/10/2007   AST: 80 ALT: 136 in 8/11: Hepatitis A., B and C negative.    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    Sleep apnea     Significant Hospital Events: Including procedures, antibiotic start and stop dates in addition to other pertinent events   Chest x-ray 04/10/2024-large right pleural effusion  Interim History / Subjective:  Awake alert interactive Breathing feels steady, gets short of breath with activity  Objective    Blood pressure 106/66, pulse 76, temperature 98.7 F (37.1 C), resp. rate (!) 22, height 5' 5 (1.651 m), weight 115.3 kg, SpO2 91%.        Intake/Output Summary (Last 24 hours) at 04/10/2024 1150 Last data filed at 04/10/2024 9043 Gross per 24 hour  Intake 916 ml  Output 2150 ml  Net  -1234 ml   Filed Weights   04/08/24 0500 04/09/24 0456 04/10/24 0500  Weight: 119.6 kg 120.6 kg 115.3 kg    Examination: General: Middle-age, does not appear to be in distress, obese HENT: Moist oral mucosa Lungs: Decreased air movement on the right Cardiovascular: S1-S2 appreciated Abdomen: Soft, bowel sounds appreciated Extremities: No clubbing, no edema Neuro: Awake alert oriented x 3 GU: Fair output  I reviewed last 24 h vitals and pain scores, last 48 h intake and output, last 24 h labs and trends, and last 24 h imaging results. 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 and if significant effusion persists with shortness of breath or need for oxygen, will plan for repeat thoracentesis  If unable to manage  fluid optimally with escalating doses on intolerance to the diuresis, discussions regarding TIPS, discussions regarding transplant need to be had  Decompensated alcoholic cirrhosis - Being diuresed  For cirrhosis f - Follows with [facility]  Hyperammonemia - Continue lactulose  Psoriatic arthritis  Will follow  Labs   CBC: Recent Labs  Lab 04/05/24 1331 04/05/24 1836 04/06/24 0100 04/07/24 0045  WBC 5.2  4.9 5.9 6.1  NEUTROABS 3.5 3.2  --   --   HGB 10.4* 9.7* 9.1* 9.9*  HCT 33.3* 31.0* 29.6* 31.1*  MCV 103.4* 103.0* 103.1* 103.3*  PLT 110* 103* 108* 108*    Basic Metabolic Panel: Recent Labs  Lab 04/06/24 0100 04/07/24 0045 04/08/24 0123 04/09/24 0330 04/10/24 0344  NA 138 140 141 138 136  K 3.7 3.6 3.4* 4.0 3.7  CL 103 103 102 101 98  CO2 32 31 32 33* 34*  GLUCOSE 91 112* 92 96 99  BUN 25* 21* 19 19 17   CREATININE 0.86 1.01 0.93 0.82 0.82  CALCIUM 8.2* 8.0* 8.1* 8.1* 8.3*  MG 1.7  --   --   --   --    GFR: Estimated Creatinine Clearance: 132.1 mL/min (by C-G formula based on SCr of 0.82 mg/dL). Recent Labs  Lab 04/05/24 1331 04/05/24 1836 04/06/24 0100 04/07/24 0045  WBC 5.2 4.9 5.9 6.1    Liver Function Tests: Recent Labs  Lab 04/05/24 1503 04/05/24 1836 04/06/24 0100 04/08/24 0123 04/10/24 0344  AST 92* 86* 86* 96* 112*  ALT 22 13 16 16 14   ALKPHOS 235* 218* 207* 229* 245*  BILITOT 3.7* 3.2* 3.0* 2.4* 3.0*  PROT 6.4* 6.0* 5.6* 6.2* 6.2*  ALBUMIN  2.4* 2.4* 2.1* 2.3* 2.3*   No results for input(s): LIPASE, AMYLASE in the last 168 hours. Recent Labs  Lab 04/05/24 1330  AMMONIA 42*    ABG    Component Value Date/Time   HCO3 27.4 12/26/2022 1657   TCO2 29 12/26/2022 1657   O2SAT 49 12/26/2022 1657     Coagulation Profile: Recent Labs  Lab 04/05/24 1331  INR 1.6*    Cardiac Enzymes: No results for input(s): CKTOTAL, CKMB, CKMBINDEX, TROPONINI in the last 168 hours.  HbA1C: Hgb A1c MFr Bld  Date/Time Value Ref Range Status  10/12/2007 03:47 PM 4.8 %     CBG: No results for input(s): GLUCAP in the last 168 hours.  Review of Systems:   Some shortness of breath, no chest pain or chest discomfort  Past Medical History:  He,  has a past medical history of Acute conjunctivitis of both eyes (10/22/2021), Acute sinusitis (06/16/2013), Alcoholic cirrhosis (HCC), Alcoholic fatty liver (01/16/2010), Alcoholism (HCC)  (12/25/2011), Allergic rhinitis, Childhood asthma, Elevated transaminase level (06/10/2007), Gastroenteritis (08/26/2021), Hand pain, left (07/28/2022), Hordeolum externum of right upper eyelid (08/14/2022), IDA (iron deficiency anemia), Morbid obesity (HCC), Scrotal varices (01/07/2010), and Sleep apnea.   Surgical History:   Past Surgical History:  Procedure Laterality Date   ESOPHAGEAL BANDING  12/26/2022   Procedure: ESOPHAGEAL BANDING;  Surgeon: Shila Gustav GAILS, MD;  Location: [facility];  Service: Gastroenterology;;   ESOPHAGOGASTRODUODENOSCOPY (EGD) WITH PROPOFOL  N/A 07/05/2022   Procedure: ESOPHAGOGASTRODUODENOSCOPY (EGD) WITH PROPOFOL ;  Surgeon: Shila Gustav GAILS, MD;  Location: [facility];  Service: Gastroenterology;  Laterality: N/A;   ESOPHAGOGASTRODUODENOSCOPY (EGD) WITH PROPOFOL  N/A 12/26/2022   Procedure: ESOPHAGOGASTRODUODENOSCOPY (EGD) WITH PROPOFOL ;  Surgeon: Shila Gustav GAILS, MD;  Location: [facility];  Service: Gastroenterology;  Laterality: N/A;   ESOPHAGOGASTRODUODENOSCOPY (EGD) WITH PROPOFOL  N/A 02/12/2023  Procedure: ESOPHAGOGASTRODUODENOSCOPY (EGD) WITH PROPOFOL ;  Surgeon: Shila Gustav GAILS, MD;  Location: [facility];  Service: Gastroenterology;  Laterality: N/A;   INCISION AND DRAINAGE OF DEEP ABSCESS, ANKLE Right 03/21/2024   Procedure: INCISION AND DRAINAGE OF DEEP ABSCESS, ANKLE;  Surgeon: Edna Toribio LABOR, MD;  Location: [facility];  Service: Orthopedics;  Laterality: Right;   IR THORACENTESIS ASP PLEURAL SPACE W/IMG GUIDE  04/05/2024   ORIF ANKLE FRACTURE Right 12/28/2022   Procedure: OPEN REDUCTION INTERNAL FIXATION (ORIF) ANKLE FRACTURE;  Surgeon: Josefina Chew, MD;  Location: [facility] OR;  Service: Orthopedics;  Laterality: Right;     Social History:   reports that he has been smoking cigars. He has been exposed to tobacco smoke. He has never used smokeless tobacco. He reports that he does not currently use alcohol. He reports that he does not  use drugs.   Family History:  His family history includes Depression in his mother; Diabetes in his father and another family member; Hypertension in his father; Liver disease in his father, mother, and sister. There is no history of Colon cancer, Esophageal cancer, Stomach cancer, or Colon polyps.   Allergies Allergies  Allergen Reactions   Oxycodone  Other (See Comments)    Constipation and lethargy - Patient does NOT want this ever prescribed again   Codeine Anxiety and Other (See Comments)    Made the patient feel jittery   Other Anxiety and Other (See Comments)    Prednisone , Medrol , and Decadron  - Prolonged jittery feelings     Jennet Epley, MD [facility] Pager: See Tracey

## 2024-04-11 ENCOUNTER — Telehealth: Payer: Self-pay | Admitting: Family Medicine

## 2024-04-11 ENCOUNTER — Inpatient Hospital Stay (HOSPITAL_COMMUNITY)

## 2024-04-11 DIAGNOSIS — E7220 Disorder of urea cycle metabolism, unspecified: Secondary | ICD-10-CM | POA: Diagnosis not present

## 2024-04-11 DIAGNOSIS — J90 Pleural effusion, not elsewhere classified: Secondary | ICD-10-CM | POA: Diagnosis not present

## 2024-04-11 DIAGNOSIS — K7030 Alcoholic cirrhosis of liver without ascites: Secondary | ICD-10-CM | POA: Diagnosis not present

## 2024-04-11 LAB — COMPREHENSIVE METABOLIC PANEL WITH GFR
ALT: 14 U/L (ref 0–44)
AST: 99 U/L — ABNORMAL HIGH (ref 15–41)
Albumin: 2.4 g/dL — ABNORMAL LOW (ref 3.5–5.0)
Alkaline Phosphatase: 228 U/L — ABNORMAL HIGH (ref 38–126)
Anion gap: 6 (ref 5–15)
BUN: 14 mg/dL (ref 6–20)
CO2: 33 mmol/L — ABNORMAL HIGH (ref 22–32)
Calcium: 8.2 mg/dL — ABNORMAL LOW (ref 8.9–10.3)
Chloride: 97 mmol/L — ABNORMAL LOW (ref 98–111)
Creatinine, Ser: 0.76 mg/dL (ref 0.61–1.24)
GFR, Estimated: >60 mL/min (ref >60)
Glucose, Bld: 92 mg/dL (ref 70–99)
Potassium: 3.5 mmol/L (ref 3.5–5.1)
Sodium: 136 mmol/L (ref 135–145)
Total Bilirubin: 2.5 mg/dL — ABNORMAL HIGH (ref 0.0–1.2)
Total Protein: 6.2 g/dL — ABNORMAL LOW (ref 6.5–8.1)

## 2024-04-11 LAB — CYTOLOGY - NON PAP

## 2024-04-11 NOTE — Procedures (Addendum)
 Thoracentesis  Procedure Note  Thomas [name]  [id number]  [birth year]  Date:04/11/24  Time:11:01 AM   Provider Performing:Nicollette Wilhelmi A Fidencia Mccloud   Procedure: Thoracentesis with imaging guidance (67444)  Indication(s) Pleural Effusion  Consent Risks of the procedure as well as the alternatives and risks of each were explained to the patient and/or caregiver.  Consent for the procedure was obtained and is signed in the bedside chart  Anesthesia Topical only with 1% lidocaine     Time Out Verified patient identification, verified procedure, site/side was marked, verified correct patient position, special equipment/implants available, medications/allergies/relevant history reviewed, required imaging and test results available.   Sterile Technique Maximal sterile technique including full sterile barrier drape, hand hygiene, sterile gown, sterile gloves, mask, hair covering, sterile ultrasound probe cover (if used).  Procedure Description Ultrasound was used to identify appropriate pleural anatomy for placement and overlying skin marked.  Area of drainage cleaned and draped in sterile fashion. Lidocaine  was used to anesthetize the skin and subcutaneous tissue.  1800 cc's of amber appearing fluid was drained from the right pleural space. Catheter then removed and bandaid applied to site.   Complications/Tolerance None; patient tolerated the procedure well. Chest X-ray is ordered to confirm no post-procedural complication.   EBL none   Specimen(s) Pleural fluid  1800cc discarded

## 2024-04-11 NOTE — Telephone Encounter (Signed)
 Patient dropped off document Home Health Certificate (Order ID [id number]), to be filled out by provider. Patient requested to send it back via Fax within 7-days. Document is located in providers tray at front office.Please advise at [phone] Home health order came through fax. I put in the dr box

## 2024-04-11 NOTE — Progress Notes (Signed)
 PROGRESS NOTE  Thomas [name]  FMW:[id number] DOB: 1975/10/04 DOA: 04/05/2024 PCP: Berneta Elsie Sayre, MD   Brief Narrative: Patient is a 48 year old male with history of psoriatic arthritis, psoriasis, alcoholic liver cirrhosis, obesity, recent infection of the hardware on the right ankle status post removal of the hardware and currently on IV antibiotics through PICC line following with Dr. Montine who presented with complaint of shortness of breath.  Patient was at PCPs office who noticed that he had severely diminished sounds on the right side and was sent to the emergency department.  Patient has a history of hepatic hydrothorax and has undergone thoracentesis several times.  On presentation, he was hemodynamically stable.  Chest imaging showed large right pleural effusion.  IR consulted.  Underwent thoracentesis with removal of 1.7 L of pleural fluid.  Patient lives alone, ambulates with the help of wheelchair, cannot bear weight on the right ankle and has poor support at home and has been having difficulty on administration of antibiotic.  PT consulted and recommended HH.  Continues to be hypervolemic.  Increased the dose of Lasix  to 60 mg twice daily.  Chest x-ray follow-up today shows increased right pleural effusion with almost complete atelectasis of the right lung.  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  Has underwent thoracentesis in the past.  Presented with right  pleural effusion.  IR did thoracentesis with removal of 1.7 L of pleural fluid.  Tolerating room air but occasionally becomes winded.   Chest x-ray follow-up today shows increased right pleural effusion with almost complete atelectasis of the right lung.  PCCM consulted.  Plan for repeat thoracentesis.  Plan to increase the dose of spironolactone  on dc  Volume overload/decompensated alcoholic cirrhosis: Take spironolactone , Lasix  at home.  Currently on IV Lasix . .  Currently not taking alcohol.  Will continue IV Lasix  until his kidney function is preserved.continue 60 mg BID  Elevated bilirubin/mild elevated liver enzymes: Likely secondary to liver cirrhosis.  Follows with [facility].We recommend to follow-up with [facility] as an outpatient.  Liver enzymes/bilirubin improved  Elevated ammonia level: Report of being confused at home.  Currently alert and oriented.  Continue lactulose  Infected right ankle hardware: History of recent infection of the hardware on the right ankle status post removal of the hardware and currently on IV antibiotics through PICC line following with Dr. Montine. Dr.Marchwai saw him here , removed some sutures, recommended 50% weightbearing.  Continue DVT prophylaxis with aspirin for 4 weeks on discharge.  Follow-up with ID and orthopedics as an outpatient.   Psoriatic arthritis: Was previously on DMARD but currently on hold due to infection of the right ankle  Normocytic anemia/thrombocytopenia: No evidence of acute blood loss.  Likely associated  with liver cirrhosis.  Currently stable  Hypokalemia: Supplemented with potassium and corrected  Obesity: BMI of 42.9  Debility/deconditioning: Patient lives alone, ambulates with the help of wheelchair, cannot bear weight on the right ankle and has poor support at home  and has been having difficulty on administration of antibiotic.  PT recommended HH        DVT prophylaxis:     Code Status: Full Code  Family  Communication: None at the bedside  Patient status: Inpatient  Patient is from :home  Anticipated discharge to:HH  Estimated DC date: likely tomorrow   Consultants: None  Procedures:  thoracentesis  Antimicrobials:  Anti-infectives (From admission, onward)    Start     Dose/Rate Route Frequency Ordered Stop   04/05/24 2200  ceFAZolin  (ANCEF ) IVPB 2g/100 mL premix  Status:  Discontinued        2 g 200 mL/hr over 30 Minutes Intravenous Every 8 hours 04/05/24 1800 04/05/24 1811   04/05/24 1715  ceFAZolin  (ANCEF ) IVPB 2g/100 mL premix       Note to Pharmacy: Indication:  Right Ankle Osteomyelitis First Dose: Yes Last Day of Therapy:  05/02/2024 Labs - Once weekly:  CBC/D and BMP, Labs - Once weekly: ESR and CRP Method of administration: IV Push   2 g 200 mL/hr over 30 Minutes Intravenous Every 8 hours 04/05/24 1627         Subjective: Patient seen and examined at bedside today.  Hemodynamically stable.  Lying in bed.  Comfortable.  On room air.  No worsening shortness of breath or cough.  We discussed about plan for repeat thoracentesis on the right side today.  Lower extremity edema look  significantly improved.  Objective: Vitals:   04/10/24 1305 04/10/24 1948 04/11/24 0415 04/11/24 0500  BP: (!) 104/52 (!) 112/55 126/74   Pulse: 72 73 80   Resp: 18 19 19    Temp: 98.2 F (36.8 C) 98.5 F (36.9 C) 97.7 F (36.5 C)   TempSrc:      SpO2: 93% 91% 97%   Weight:    115.3 kg  Height:        Intake/Output Summary (Last 24 hours) at 04/11/2024 1043 Last data filed at 04/11/2024 0500 Gross per 24 hour  Intake 350 ml  Output 1150 ml  Net -800 ml   Filed Weights   04/09/24 0456 04/10/24 0500 04/11/24 0500  Weight: 120.6 kg 115.3 kg 115.3 kg    Examination:     General exam: Overall comfortable, not in distress, morbidly obese HEENT: PERRL Respiratory system: Diminished air sounds on the right side Cardiovascular system: S1 & S2 heard, RRR.  Gastrointestinal  system: Abdomen is nondistended, soft and nontender. Central nervous system: Alert and oriented Extremities: very trace bilateral lower extremity edema, no clubbing ,no cyanosis,right ankle covered with dressing Skin: No rashes, no ulcers,no icterus     Data Reviewed: I have personally reviewed following labs and imaging studies  CBC: Recent Labs  Lab 04/05/24 1331 04/05/24 1836 04/06/24 0100 04/07/24 0045  WBC 5.2 4.9 5.9 6.1  NEUTROABS 3.5 3.2  --   --   HGB 10.4* 9.7* 9.1* 9.9*  HCT 33.3* 31.0* 29.6* 31.1*  MCV 103.4* 103.0* 103.1* 103.3*  PLT 110* 103* 108* 108*   Basic Metabolic Panel: Recent Labs  Lab 04/06/24 0100 04/07/24 0045 04/08/24 0123 04/09/24 0330 04/10/24 0344 04/11/24 0245  NA 138 140 141 138 136 136  K 3.7 3.6 3.4* 4.0 3.7 3.5  CL 103 103 102 101 98 97*  CO2 32 31 32 33* 34* 33*  GLUCOSE 91 112* 92 96 99 92  BUN 25* 21* 19 19 17 14   CREATININE 0.86 1.01 0.93 0.82 0.82 0.76  CALCIUM 8.2* 8.0* 8.1* 8.1* 8.3*  8.2*  MG 1.7  --   --   --   --   --      Recent Results (from the past 240 hours)  Resp panel by RT-PCR (RSV, Flu A&B, Covid) Anterior Nasal Swab     Status: None   Collection Time: 04/05/24  1:13 PM   Specimen: Anterior Nasal Swab  Result Value Ref Range Status   SARS Coronavirus 2 by RT PCR NEGATIVE NEGATIVE Final    Comment: (NOTE) SARS-CoV-2 target nucleic acids are NOT DETECTED.  The SARS-CoV-2 RNA is generally detectable in upper respiratory specimens during the acute phase of infection. The lowest concentration of SARS-CoV-2 viral copies this assay can detect is 138 copies/mL. A negative result does not preclude SARS-Cov-2 infection and should not be used as the sole basis for treatment or other patient management decisions. A negative result may occur with  improper specimen collection/handling, submission of specimen other than nasopharyngeal swab, presence of viral mutation(s) within the areas targeted by this assay, and  inadequate number of viral copies(<138 copies/mL). A negative result must be combined with clinical observations, patient history, and epidemiological information. The expected result is Negative.  Fact Sheet for Patients:  bloggercourse.com  Fact Sheet for Healthcare Providers:  seriousbroker.it  This test is no t yet approved or cleared by the [place]  FDA and  has been authorized for detection and/or diagnosis of SARS-CoV-2 by FDA under an Emergency Use Authorization (EUA). This EUA will remain  in effect (meaning this test can be used) for the duration of the COVID-19 declaration under Section 564(b)(1) of the Act, 21 U.S.C.section 360bbb-3(b)(1), unless the authorization is terminated  or revoked sooner.       Influenza A by PCR NEGATIVE NEGATIVE Final   Influenza B by PCR NEGATIVE NEGATIVE Final    Comment: (NOTE) The Xpert Xpress SARS-CoV-2/FLU/RSV plus assay is intended as an aid in the diagnosis of influenza from Nasopharyngeal swab specimens and should not be used as a sole basis for treatment. Nasal washings and aspirates are unacceptable for Xpert Xpress SARS-CoV-2/FLU/RSV testing.  Fact Sheet for Patients: bloggercourse.com  Fact Sheet for Healthcare Providers: seriousbroker.it  This test is not yet approved or cleared by the [place]  FDA and has been authorized for detection and/or diagnosis of SARS-CoV-2 by FDA under an Emergency Use Authorization (EUA). This EUA will remain in effect (meaning this test can be used) for the duration of the COVID-19 declaration under Section 564(b)(1) of the Act, 21 U.S.C. section 360bbb-3(b)(1), unless the authorization is terminated or revoked.     Resp Syncytial Virus by PCR NEGATIVE NEGATIVE Final    Comment: (NOTE) Fact Sheet for Patients: bloggercourse.com  Fact Sheet for Healthcare  Providers: seriousbroker.it  This test is not yet approved or cleared by the [place]  FDA and has been authorized for detection and/or diagnosis of SARS-CoV-2 by FDA under an Emergency Use Authorization (EUA). This EUA will remain in effect (meaning this test can be used) for the duration of the COVID-19 declaration under Section 564(b)(1) of the Act, 21 U.S.C. section 360bbb-3(b)(1), unless the authorization is terminated or revoked.  Performed at [facility], [address], [place], [place] [address]   Body fluid culture w Gram Stain     Status: None   Collection Time: 04/05/24  4:46 PM   Specimen: Pleural, Right; Body Fluid  Result Value Ref Range Status   Specimen Description   Final    Pleural R Performed at [facility], [address] [place], [place] [address]    Special Requests   Final    NONE Performed at [facility], [address] [place], [place] [address]    Gram Stain NO WBC SEEN NO ORGANISMS SEEN   Final   Culture   Final    NO GROWTH 3 DAYS Performed at [facility], [address], [place], [place] [address]    Report Status 04/09/2024 FINAL  Final     Radiology Studies: DG Chest Port 1 View Result Date: 04/11/2024 EXAM: 1 VIEW(S) XRAY OF THE CHEST 04/11/2024 06:52:00 AM COMPARISON: Portable chest yesterday at 09/33/2025 09:33:00 AM. CLINICAL HISTORY: 142230 Pleural effusion 142230 Pleural effusion. FINDINGS: LUNGS AND PLEURA: Today there is increased patchy hazy atelectasis versus infiltrate in the left lower lung field while the left mid and upper lung remain clear. There is worsening near-complete opacification of the right thorax likely a combination of a large pleural effusion and compressive lung collapse. Only a small perihilar portion of the lung remains partially aerated. No pulmonary edema. No pneumothorax. HEART AND MEDIASTINUM: There is no mediastinal  shift. The mediastinum is stable. Right heart and mediastinal borders are obscured. Vessel markings are of normal caliber on the left. BONES AND SOFT TISSUES: No acute osseous abnormality. IMPRESSION: 1. Worsening near-complete opacification of the right hemithorax, likely from a large pleural effusion with compressive lung collapse. Only a small perihilar portion of the right lung remains partially aerated. 2. Increased patchy hazy atelectasis versus infiltrate in the left lower lung field. Electronically signed by: Francis Quam MD 04/11/2024 07:09 AM EST RP Workstation: [id number]   DG CHEST PORT 1 VIEW Result Date: 04/10/2024 CLINICAL DATA:  Cough and shortness of breath EXAM: PORTABLE CHEST 1 VIEW COMPARISON:  Chest radiograph dated 03/28/2024 FINDINGS: Lines/tubes: Right upper extremity PICC tip projects over the chest tube. Lungs: Near-complete atelectasis of the right lung. Left lung is well inflated. Insert no focal Pleura: Increased large right pleural effusion.  No pneumothorax. Heart/mediastinum: Right heart border is obscured. Bones: No acute osseous abnormality. IMPRESSION: Increased large right pleural effusion with near-complete atelectasis of the right lung. Electronically Signed   By: Limin  Xu M.D.   On: 04/10/2024 10:33     Scheduled Meds:  Chlorhexidine  Gluconate Cloth  6 each Topical Daily   clobetasol  cream   Topical BID   feeding supplement  237 mL Oral BID BM   folic acid   1 mg Oral Q1200   furosemide   60 mg Intravenous BID   lactulose  20 g Oral TID   loteprednol  1 drop Both Eyes QID   nadolol   20 mg Oral Daily   pantoprazole   40 mg Oral BID   sodium chloride  flush  10-40 mL Intracatheter Q12H   spironolactone   100 mg Oral Daily   traMADol   50 mg Oral Once   Continuous Infusions:  ceFAZolin  2 g (04/11/24 0718)     LOS: 6 days   Ivonne Mustache, MD [facility] P11/08/2023, 10:43 AM

## 2024-04-11 NOTE — TOC Progression Note (Addendum)
 Transition of Care [facility]) - Progression Note    Patient Details  Name: BRODEE MAURITZ MRN: [id number] Date of Birth: [birth year]  Transition of Care [facility]) CM/SW Contact  Heather DELENA Saltness, LCSW Phone Number: 04/11/2024, 9:26 AM  Clinical Narrative:     ADDENDUM  11:10 AM - CSW met with pt at bedside to discuss PT's recommendation for [facility] PT services upon discharge. Pt reports he is agreeable to [facility] PT services. HH PT set up with [facility], confirmed with Cindie. Pt reports he will need PTAR transportation home.  Pt discharging home on IV antibiotics. CSW spoke with Holley Herring at [facility] services, who confirmed ability to continue [facility] RN and IV antibiotics upon discharge. PT recommeding HH PT/OT services. Pt denied by [facility] agencies due to [employer]. TOC will continue to follow.   Expected Discharge Plan: Home w Home Health Services Barriers to Discharge: Continued Medical Work up   Expected Discharge Plan and Services In-house Referral: Clinical Social Work Discharge Planning Services: NA Post Acute Care Choice: Home Health Living arrangements for the past 2 months: Single Family Home                 DME Arranged: N/A DME Agency: NA       HH Arranged: PT, OT, RN HH Agency: [facility], [facility] Date HH Agency Contacted: 04/06/24 Time HH Agency Contacted: 1145 Representative spoke with at [facility] Agency: Cindie at [place] / Pam at [employer]   Social Drivers of Health (SDOH) Interventions SDOH Screenings   Food Insecurity: No Food Insecurity (04/05/2024)  Housing: Low Risk  (04/05/2024)  Transportation Needs: No Transportation Needs (04/05/2024)  Utilities: Not At Risk (04/05/2024)  Depression (PHQ2-9): Low Risk  (03/31/2024)  Financial Resource Strain: Low Risk  (11/26/2023)  Physical Activity: Insufficiently Active (11/26/2023)  Social Connections: Socially Isolated (11/26/2023)  Stress: No Stress Concern Present (11/26/2023)  Tobacco Use: High Risk  (04/05/2024)    Readmission Risk Interventions    04/06/2024   11:43 AM 03/23/2024    3:12 PM  Readmission Risk Prevention Plan  Transportation Screening Complete Complete  PCP or Specialist Appt within 5-7 Days  Complete  Home Care Screening  Complete  Medication Review (RN CM)  Complete  Medication Review (RN Care Manager) Complete   PCP or Specialist appointment within 3-5 days of discharge Complete   HRI or Home Care Consult Complete   SW Recovery Care/Counseling Consult Complete   Palliative Care Screening Not Applicable   Skilled Nursing Facility Not Applicable     Signed: Heather Saltness, MSW, LCSW Clinical Social Worker Inpatient Care Management 04/11/2024 9:29 AM

## 2024-04-11 NOTE — Plan of Care (Signed)
  Problem: Education: Goal: Knowledge of General Education information will improve Description: Including pain rating scale, medication(s)/side effects and non-pharmacologic comfort measures Outcome: Progressing   Problem: Elimination: Goal: Will not experience complications related to bowel motility Outcome: Progressing Goal: Will not experience complications related to urinary retention Outcome: Progressing   Problem: Safety: Goal: Ability to remain free from injury will improve Outcome: Progressing   Problem: Skin Integrity: Goal: Risk for impaired skin integrity will decrease Outcome: Progressing   Problem: Health Behavior/Discharge Planning: Goal: Ability to manage health-related needs will improve Outcome: Not Progressing   Problem: Clinical Measurements: Goal: Ability to maintain clinical measurements within normal limits will improve Outcome: Not Progressing Goal: Diagnostic test results will improve Outcome: Not Progressing Goal: Cardiovascular complication will be avoided Outcome: Not Progressing   Problem: Activity: Goal: Risk for activity intolerance will decrease Outcome: Not Progressing   Problem: Nutrition: Goal: Adequate nutrition will be maintained Outcome: Not Progressing   Problem: Pain Managment: Goal: General experience of comfort will improve and/or be controlled Outcome: Not Progressing

## 2024-04-11 NOTE — Significant Event (Signed)
 Rapid Response Event Note   Reason for Call :  Bedside Thoracentesis, This RN monitored this patient while they underwent a thoracentesis by Doctor Neda. Consent was obtained, and timeout was performed. All vital signs have been documented and are in the flowsheets. Patient remained stable throughout procedure.  MD Notified: Jennet Neda, MD  Arrival Time: 1005 End Time: 1045  Thomas LILLETTE Settles, RN

## 2024-04-11 NOTE — Plan of Care (Signed)
  Problem: Clinical Measurements: Goal: Ability to maintain clinical measurements within normal limits will improve Outcome: Progressing Goal: Will remain free from infection Outcome: Progressing Goal: Diagnostic test results will improve Outcome: Progressing Goal: Respiratory complications will improve Outcome: Progressing   Problem: Activity: Goal: Risk for activity intolerance will decrease Outcome: Progressing   Problem: Nutrition: Goal: Adequate nutrition will be maintained Outcome: Progressing   Problem: Coping: Goal: Level of anxiety will decrease Outcome: Progressing   Problem: Elimination: Goal: Will not experience complications related to bowel motility Outcome: Progressing Goal: Will not experience complications related to urinary retention Outcome: Progressing   Problem: Pain Managment: Goal: General experience of comfort will improve and/or be controlled Outcome: Progressing   Problem: Safety: Goal: Ability to remain free from injury will improve Outcome: Progressing   Problem: Skin Integrity: Goal: Risk for impaired skin integrity will decrease Outcome: Progressing

## 2024-04-11 NOTE — Progress Notes (Signed)
 NAME:  Thomas [name], MRN:  [id number], DOB:  08-14-1975, LOS: 6 ADMISSION DATE:  04/05/2024, CONSULTATION DATE: 04/10/24 REFERRING MD: Dr. 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 Recently had thoracentesis with reaccumulation of fluid   History of alcoholic liver cirrhosis, psoriatic arthritis, psoriasis, obesity, recent infection of right ankle hardware Was admitted with shortness of breath found to have a large pleural effusion for which he had a thoracentesis Has had thoracentesis multiple times previously  Pertinent  Medical History   Past Medical History:  Diagnosis Date   Acute conjunctivitis of both eyes 10/22/2021   Acute sinusitis 06/16/2013   Alcoholic cirrhosis (HCC)    Alcoholic fatty liver 01/16/2010   Needs final HBV and HAV vaccines on or after 10/25/2012    Alcoholism (HCC) 12/25/2011   Allergic rhinitis    Childhood asthma    Elevated transaminase level 06/10/2007   AST: 80 ALT: 136 in 8/11: Hepatitis A., B and C negative.    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    Sleep apnea      Significant Hospital Events: Including procedures, antibiotic start and stop dates in addition to other pertinent events   10/28-thoracentesis for 1700 cc of fluid 11/2 consult to pulmonary  Interim History / Subjective:  He is awake and interactive Breathing feels about the same Back on oxygen supplementation Uneventful night  Objective    Blood pressure 126/74, pulse 80, temperature 97.7 F (36.5 C), resp. rate 19, height 5' 5 (1.651 m), weight 115.3 kg, SpO2 97%.        Intake/Output Summary (Last 24 hours) at 04/11/2024 0745 Last data filed at 04/11/2024 0500 Gross per 24 hour   Intake 560 ml  Output 1700 ml  Net -1140 ml   Filed Weights   04/09/24 0456 04/10/24 0500 04/11/24 0500  Weight: 120.6 kg 115.3 kg 115.3 kg    Examination: General: Middle-age, does not appear to be in distress HENT: Moist oral mucosa Lungs: Decreased air movement on the right Cardiovascular: S1-S2 appreciated Abdomen: Soft, bowel sounds appreciated Extremities: No clubbing, no edema Neuro: Awake alert oriented x 3, nonfocal exam GU: Fair output  I reviewed last 24 h vitals and pain scores, last 48 h intake and output, last 24 h labs and trends, and last 24 h imaging results.  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 - Recent thoracentesis for 1.7 L of fluid - Significant rapid reaccumulation of fluid  -Management of hepatic hydrothorax will be aggressive diuresis with spironolactone  and Lasix  - Started on spironolactone  100 mg - Dose can be increased in about 3 days to 200 mg, can go up to 300 or 400 mg as tolerated - Continue to adjust Lasix  as needed as well as long as hemodynamics is stable  Will plan for repeat thoracentesis today  Continue oxygen supplementation to keep saturations over 90  Decompensated alcoholic cirrhosis - Being diuresed  For history of cirrhosis to follow-up [facility]  Hyperammonemia - Continue lactulose  Psoriatic arthritis  Labs   CBC: Recent Labs  Lab 04/05/24 1331 04/05/24 1836 04/06/24 0100 04/07/24 0045  WBC 5.2 4.9 5.9 6.1  NEUTROABS 3.5 3.2  --   --   HGB 10.4* 9.7* 9.1* 9.9*  HCT 33.3* 31.0* 29.6* 31.1*  MCV 103.4* 103.0* 103.1* 103.3*  PLT 110* 103* 108* 108*    Basic Metabolic Panel: Recent Labs  Lab 04/06/24 0100 04/07/24 0045 04/08/24 0123 04/09/24 0330 04/10/24 0344 04/11/24 0245  NA 138 140 141  138 136 136  K 3.7 3.6 3.4* 4.0 3.7 3.5  CL 103 103 102 101 98 97*  CO2 32 31 32 33* 34* 33*  GLUCOSE 91 112* 92 96 99 92  BUN 25* 21* 19 19 17 14   CREATININE 0.86 1.01 0.93 0.82 0.82 0.76  CALCIUM 8.2* 8.0* 8.1* 8.1* 8.3* 8.2*  MG 1.7  --   --   --   --   --    GFR: Estimated Creatinine Clearance: 135.5 mL/min (by C-G formula based on SCr of 0.76 mg/dL). Recent Labs  Lab 04/05/24 1331 04/05/24 1836 04/06/24 0100 04/07/24 0045  WBC 5.2 4.9 5.9 6.1    Liver Function Tests: Recent Labs  Lab 04/05/24 1836 04/06/24 0100 04/08/24 0123 04/10/24 0344 04/11/24 0245  AST 86* 86* 96* 112* 99*  ALT 13 16 16 14 14   ALKPHOS 218* 207* 229* 245* 228*  BILITOT 3.2* 3.0* 2.4* 3.0* 2.5*  PROT 6.0* 5.6* 6.2* 6.2* 6.2*  ALBUMIN  2.4* 2.1* 2.3* 2.3* 2.4*   No results for input(s): LIPASE, AMYLASE in the last 168 hours. Recent Labs  Lab 04/05/24 1330  AMMONIA 42*    ABG    Component Value Date/Time   HCO3 27.4 12/26/2022 1657   TCO2 29 12/26/2022 1657   O2SAT 49 12/26/2022 1657     Coagulation Profile: Recent Labs  Lab 04/05/24 1331  INR 1.6*    Cardiac Enzymes: No results for input(s): CKTOTAL, CKMB, CKMBINDEX, TROPONINI in the last 168 hours.  HbA1C: Hgb A1c MFr Bld  Date/Time Value Ref Range Status  10/12/2007 03:47 PM 4.8 %     CBG: No results for input(s): GLUCAP in the last 168 hours.  Review of Systems:   Awake Interactive Denies any significant complaints Admits to shortness of breath  Past Medical History:  He,  has a past medical history of Acute conjunctivitis of both eyes (10/22/2021), Acute sinusitis (06/16/2013), Alcoholic cirrhosis (HCC), Alcoholic fatty liver (01/16/2010), Alcoholism (HCC) (12/25/2011), Allergic rhinitis, Childhood asthma, Elevated transaminase level (06/10/2007), Gastroenteritis (08/26/2021), Hand pain, left (07/28/2022), Hordeolum externum of right upper eyelid (08/14/2022), IDA (iron deficiency anemia), Morbid  obesity (HCC), Scrotal varices (01/07/2010), and Sleep apnea.   Surgical History:   Past Surgical History:  Procedure Laterality Date   ESOPHAGEAL BANDING  12/26/2022   Procedure: ESOPHAGEAL BANDING;  Surgeon: Shila Gustav GAILS, MD;  Location: [facility];  Service: Gastroenterology;;   ESOPHAGOGASTRODUODENOSCOPY (EGD) WITH PROPOFOL  N/A 07/05/2022   Procedure: ESOPHAGOGASTRODUODENOSCOPY (EGD) WITH PROPOFOL ;  Surgeon: Shila Gustav GAILS, MD;  Location: [facility];  Service: Gastroenterology;  Laterality: N/A;   ESOPHAGOGASTRODUODENOSCOPY (EGD) WITH PROPOFOL  N/A 12/26/2022   Procedure: ESOPHAGOGASTRODUODENOSCOPY (EGD) WITH PROPOFOL ;  Surgeon: Shila Gustav GAILS, MD;  Location: [facility];  Service: Gastroenterology;  Laterality: N/A;   ESOPHAGOGASTRODUODENOSCOPY (EGD) WITH PROPOFOL  N/A 02/12/2023   Procedure: ESOPHAGOGASTRODUODENOSCOPY (EGD) WITH PROPOFOL ;  Surgeon: Shila Gustav GAILS, MD;  Location: [facility];  Service: Gastroenterology;  Laterality: N/A;   INCISION AND DRAINAGE OF DEEP ABSCESS, ANKLE Right 03/21/2024   Procedure: INCISION AND DRAINAGE OF DEEP  ABSCESS, ANKLE;  Surgeon: Edna Toribio LABOR, MD;  Location: [facility];  Service: Orthopedics;  Laterality: Right;   IR THORACENTESIS ASP PLEURAL SPACE W/IMG GUIDE  04/05/2024   ORIF ANKLE FRACTURE Right 12/28/2022   Procedure: OPEN REDUCTION INTERNAL FIXATION (ORIF) ANKLE FRACTURE;  Surgeon: Josefina Chew, MD;  Location: [facility];  Service: Orthopedics;  Laterality: Right;     Social History:   reports that he has been smoking cigars. He has been exposed to tobacco smoke. He has never used smokeless tobacco. He reports that he does not currently use alcohol. He reports that he does not use drugs.   Family History:  His family history includes Depression in his mother; Diabetes in his father and another family member; Hypertension in his father; Liver disease in his father, mother, and sister. There is no history of Colon  cancer, Esophageal cancer, Stomach cancer, or Colon polyps.   Allergies Allergies  Allergen Reactions   Oxycodone  Other (See Comments)    Constipation and lethargy - Patient does NOT want this ever prescribed again   Codeine Anxiety and Other (See Comments)    Made the patient feel jittery   Other Anxiety and Other (See Comments)    Prednisone , Medrol , and Decadron  - Prolonged jittery feelings     Jennet Epley, MD [facility] Pager: See Tracey

## 2024-04-12 ENCOUNTER — Other Ambulatory Visit (HOSPITAL_COMMUNITY): Payer: Self-pay

## 2024-04-12 ENCOUNTER — Telehealth: Payer: Self-pay | Admitting: Nurse Practitioner

## 2024-04-12 DIAGNOSIS — M2550 Pain in unspecified joint: Secondary | ICD-10-CM

## 2024-04-12 DIAGNOSIS — J90 Pleural effusion, not elsewhere classified: Secondary | ICD-10-CM

## 2024-04-12 DIAGNOSIS — G4733 Obstructive sleep apnea (adult) (pediatric): Secondary | ICD-10-CM | POA: Diagnosis not present

## 2024-04-12 DIAGNOSIS — K7031 Alcoholic cirrhosis of liver with ascites: Secondary | ICD-10-CM | POA: Diagnosis not present

## 2024-04-12 DIAGNOSIS — I8500 Esophageal varices without bleeding: Secondary | ICD-10-CM | POA: Diagnosis not present

## 2024-04-12 DIAGNOSIS — K7030 Alcoholic cirrhosis of liver without ascites: Secondary | ICD-10-CM | POA: Diagnosis not present

## 2024-04-12 DIAGNOSIS — T847XXA Infection and inflammatory reaction due to other internal orthopedic prosthetic devices, implants and grafts, initial encounter: Secondary | ICD-10-CM | POA: Diagnosis not present

## 2024-04-12 DIAGNOSIS — E7220 Disorder of urea cycle metabolism, unspecified: Secondary | ICD-10-CM | POA: Diagnosis not present

## 2024-04-12 DIAGNOSIS — S91001D Unspecified open wound, right ankle, subsequent encounter: Secondary | ICD-10-CM

## 2024-04-12 DIAGNOSIS — Z7952 Long term (current) use of systemic steroids: Secondary | ICD-10-CM

## 2024-04-12 DIAGNOSIS — I10 Essential (primary) hypertension: Secondary | ICD-10-CM

## 2024-04-12 DIAGNOSIS — J9811 Atelectasis: Secondary | ICD-10-CM | POA: Diagnosis not present

## 2024-04-12 DIAGNOSIS — Z9181 History of falling: Secondary | ICD-10-CM

## 2024-04-12 DIAGNOSIS — K766 Portal hypertension: Secondary | ICD-10-CM

## 2024-04-12 DIAGNOSIS — D649 Anemia, unspecified: Secondary | ICD-10-CM

## 2024-04-12 LAB — COMPREHENSIVE METABOLIC PANEL WITH GFR
ALT: 15 U/L (ref 0–44)
AST: 85 U/L — ABNORMAL HIGH (ref 15–41)
Albumin: 2.1 g/dL — ABNORMAL LOW (ref 3.5–5.0)
Alkaline Phosphatase: 219 U/L — ABNORMAL HIGH (ref 38–126)
Anion gap: 8 (ref 5–15)
BUN: 15 mg/dL (ref 6–20)
CO2: 31 mmol/L (ref 22–32)
Calcium: 8.2 mg/dL — ABNORMAL LOW (ref 8.9–10.3)
Chloride: 96 mmol/L — ABNORMAL LOW (ref 98–111)
Creatinine, Ser: 0.89 mg/dL (ref 0.61–1.24)
GFR, Estimated: >60 mL/min (ref >60)
Glucose, Bld: 87 mg/dL (ref 70–99)
Potassium: 3.8 mmol/L (ref 3.5–5.1)
Sodium: 136 mmol/L (ref 135–145)
Total Bilirubin: 2.2 mg/dL — ABNORMAL HIGH (ref 0.0–1.2)
Total Protein: 6 g/dL — ABNORMAL LOW (ref 6.5–8.1)

## 2024-04-12 MED ORDER — HEPARIN SOD (PORK) LOCK FLUSH 100 UNIT/ML IV SOLN
250.0000 [IU] | INTRAVENOUS | Status: DC | PRN
  Filled 2024-04-12: qty 3

## 2024-04-12 MED ORDER — FUROSEMIDE 40 MG PO TABS
40.0000 mg | ORAL_TABLET | Freq: Every morning | ORAL | 0 refills | Status: DC
  Filled 2024-04-12: qty 60, 60d supply, fill #0

## 2024-04-12 MED ORDER — LACTULOSE 10 GM/15ML PO SOLN
20.0000 g | Freq: Three times a day (TID) | ORAL | 5 refills | Status: AC
  Filled 2024-04-12: qty 473, 5d supply, fill #0

## 2024-04-12 MED ORDER — NADOLOL 20 MG PO TABS
20.0000 mg | ORAL_TABLET | Freq: Every day | ORAL | 0 refills | Status: AC
  Filled 2024-04-12: qty 60, 60d supply, fill #0

## 2024-04-12 MED ORDER — SPIRONOLACTONE 100 MG PO TABS
100.0000 mg | ORAL_TABLET | Freq: Every day | ORAL | 0 refills | Status: AC
  Filled 2024-04-12: qty 60, 60d supply, fill #0

## 2024-04-12 MED ORDER — CEFAZOLIN IV (FOR PTA / DISCHARGE USE ONLY): 2.0000 g | Freq: Three times a day (TID) | INTRAVENOUS | 0 refills | Status: AC

## 2024-04-12 MED ORDER — HEPARIN SOD (PORK) LOCK FLUSH 100 UNIT/ML IV SOLN
250.0000 [IU] | INTRAVENOUS | Status: AC | PRN
  Administered 2024-04-12: 250 [IU]
  Filled 2024-04-12: qty 3

## 2024-04-12 NOTE — Progress Notes (Signed)
 Occupational Therapy Treatment Patient Details Name: Thomas [name] MRN: [id number] DOB: [birth year] Today's Date: 04/12/2024   History of present illness Patient is a 48 year old male admitted with SOB, LLE edema, Dx of pleural effusion and s/p thoracentesis (1.75 L).   Patient was hospitalized from October 12 through October 22.  He had infection of the hardware in his right ankle and is status post removal of the hardware and is on outpatient IV antibiotics through PICC line.  WB status 50% RLE 04/07/24. Also admitted 9/13-9/17 with a pleural effusion.  Patient also had bedside thoracentesis 04/11/24 with 1.8 L fluid removed.  PMHx includes psoriatic arthritis, psoriasis, ETOH cirrhosis, and obesity   OT comments  Patient verbalized concerns about mobility in home without assistance, particularly regarding newly installed ramp which may be steeper than code specs, as well as accessing the walk-in shower located on 2nd floor of home.  Patient is concerned because he previously received assistance from his father who has recently passed away.  Provided education regarding contacting installers of ramp to ensure slope is correct.  Suggestions for compensatory strategies for navigating flight of stairs were also provided, as well as collaborative problem solving with home health PT.  Aside from those concerns, patient endorsed increased independence with functional mobility and LB dressing, including donning & doffing CAM boot without assistance.  Patient's discharge order has been issued and he will be returning home with home health PT services.  No further OT services are anticipated.  OT signing off.      If plan is discharge home, recommend the following:  A little help with bathing/dressing/bathroom;Help with stairs or ramp for entrance      Recommendations for Other Services Based upon patient's concerns about descending newly installed ramp at home, encouraged patient to contact the installer  and ensure the slope of the ramp meets code.  Patient also encouraged to discuss with home health therapist for compensatory solutions.    Precautions / Restrictions Precautions Precautions: Fall Recall of Precautions/Restrictions: Intact Required Braces or Orthoses: Other Brace Other Brace: Rignt ankle CAM boot Restrictions Weight Bearing Restrictions Per Provider Order: Yes RLE Weight Bearing Per Provider Order: Partial weight bearing RLE Partial Weight Bearing Percentage or Pounds: 50 Other Position/Activity Restrictions: CAM boot to be worn whenever OOB          Balance Overall balance assessment: Needs assistance Sitting-balance support: No upper extremity supported, Feet unsupported Sitting balance-Leahy Scale: Good     ADL either performed or assessed with clinical judgement   ADL Overall ADL's : Needs assistance/impaired Eating/Feeding: Independent;Sitting Lower Body Dressing: Sitting/lateral leans;Set up Lower Body Dressing Details (indicate cue type and reason): Patient reported donning & doffing CAM boot without assistance       Communication Communication Communication: No apparent difficulties   Cognition Arousal: Alert Behavior During Therapy: WFL for tasks assessed/performed Cognition: No apparent impairments                 General Comments Patient was provided reacher and instruction in use for LB dressing; patient verbalized understanding.    Pertinent Vitals/ Pain       Pain Assessment Pain Assessment: 0-10 Pain Score: 5  Pain Location: Right ankle Pain Descriptors / Indicators: Throbbing Pain Intervention(s): Monitored during session         Frequency  Min 2X/week        Progress Toward Goals  OT Goals(current goals can now be found in the care plan section)  Progress  towards OT goals: Goals met/education completed, patient discharged from OT  Acute Rehab OT Goals Patient Stated Goal: Return safely home OT Goal Formulation:  With patient Time For Goal Achievement: 04/21/24 Potential to Achieve Goals: Good  Plan         AM-PAC OT 6 Clicks Daily Activity     Outcome Measure   Help from another person eating meals?: None Help from another person taking care of personal grooming?: None Help from another person toileting, which includes using toliet, bedpan, or urinal?: None Help from another person bathing (including washing, rinsing, drying)?: A Little Help from another person to put on and taking off regular upper body clothing?: A Little Help from another person to put on and taking off regular lower body clothing?: A Little 6 Click Score: 21    End of Session OT Visit Diagnosis: Muscle weakness (generalized) (M62.81);Unsteadiness on feet (R26.81) Pain - Right/Left: Right Pain - part of body: Ankle and joints of foot   Activity Tolerance Patient tolerated treatment well   Patient Left in chair;with call bell/phone within reach   Nurse Communication Mobility status        Time: 8585-8558 OT Time Calculation (min): 27 min  Charges: OT General Charges $OT Visit: 1 Visit OT Treatments $Therapeutic Activity: 23-37 mins  [name], MS, OTR/L 04/12/2024, 4:25 PM

## 2024-04-12 NOTE — Progress Notes (Signed)
 Pt has DC order. AVS was given and explained to pt, all questions were answered. CM was informed that pt is out of IV Ancef  at home, CM mentioned that it will be delivered tonight in time for the next dose at home (pt was informed). IV team was consulted to disconnect pt from the PICC line and heplock. Home med was delivered at bedside. CM to set-up PTAR. Pending PTAR.

## 2024-04-12 NOTE — Discharge Summary (Signed)
 Physician Discharge Summary  Thomas [name] FMW:[id number] DOB: [birth year] DOA: 04/05/2024  PCP: Berneta Elsie Sayre, MD  Admit date: 04/05/2024 Discharge date: 04/12/2024  Admitted From: Home Disposition:  Home  Discharge Condition:Stable CODE STATUS:FULL Diet recommendation:Low sodium  Brief/Interim Summary: Patient is a 47 year old male with history of psoriatic arthritis, psoriasis, alcoholic liver cirrhosis, obesity, recent infection of the hardware on the right ankle status post removal of the hardware and currently on IV antibiotics through PICC line following with Dr. Montine who presented with complaint of shortness of breath.  Patient was at PCPs office who noticed that he had severely diminished sounds on the right side and was sent to the emergency department.  Patient has a history of hepatic hydrothorax and has undergone thoracentesis several times.  On presentation, he was hemodynamically stable.  Chest imaging showed large right pleural effusion.  IR consulted.  Underwent thoracentesis with removal of 1.7 L of pleural fluid. Given IV Lasix  for volume overload.  Chest x-ray follow-up on 10/2 showed increased right pleural effusion with almost complete atelectasis of the right lung.  PCCM consulted.Underwent  repeat right sided thoracentesis.  Currently remains on room air.  Volume status looks improved.  Medically stable for discharge home today with oral Lasix  and spironolactone .  He needs to follow-up with GI/hepatology as an outpatient.    Following problems were addressed during the hospitalization:  Recurrent right pleural effusion: Secondary to hepatic hydrothorax.  Has underwent thoracentesis in the past.  Presented with right pleural effusion.  IR did thoracentesis with removal of 1.7 L of pleural fluid.  Repeat CXR showed increased right pleural effusion with almost complete atelectasis of the right lung.  PCCM consulted.  S/P  repeat thoracentesis.  Remains on room  air.  Continue Lasix  and spironolactone  on discharge  Volume overload/decompensated alcoholic cirrhosis: Take spironolactone , Lasix  at home.  Given  IV Lasix . .  Currently not taking alcohol.  Continue Lasix  40 mg daily, spironolactone  100 mg on discharge.  Might need to go up on spironolactone  as outpatient   Elevated bilirubin/mild elevated liver enzymes: Likely secondary to liver cirrhosis.  Follows with [facility].We recommend to follow-up with [facility] as an outpatient.  Liver enzymes/bilirubin improved.  Message sent to [facility] for arranging outpatient follow-up   Elevated ammonia level: Report of being confused at home.  Currently alert and oriented.  Continue lactulose   Infected right ankle hardware: History of recent infection of the hardware on the right ankle status post removal of the hardware and currently on IV antibiotics through PICC line following with Dr. Montine. Dr.Marchwai saw him here , removed some sutures, recommended 50% weightbearing.  Continue DVT prophylaxis with aspirin for total 4 weeks on discharge.  Follow-up with ID and orthopedics as an outpatient.    Psoriatic arthritis: Was previously on DMARD but currently on hold due to infection of the right ankle   Normocytic anemia/thrombocytopenia: No evidence of acute blood loss.  Likely associated  with liver cirrhosis.  Currently stable   Hypokalemia: Supplemented with potassium and corrected   Obesity: BMI of 42.9   Debility/deconditioning: Patient lives alone, ambulates with the help of wheelchair, cannot bear weight on the right ankle and has poor support at home and has been having difficulty on administration of antibiotic.  PT recommended [facility]     Discharge Diagnoses:  Principal Problem:   Pleural effusion on right Active Problems:   Secondary esophageal varices with bleeding (HCC)   Scrotal varices, left  Alcohol use disorder   OSA (obstructive sleep apnea)   Class 3 severe obesity due to  excess calories with body mass index (BMI) of 50.0 to 59.9 in adult (HCC)   Psoriatic arthritis (HCC)   Cirrhosis of liver (HCC)   Wound infection complicating hardware, sequela    Discharge Instructions  Discharge Instructions     Diet - low sodium heart healthy   Complete by: As directed    Discharge instructions   Complete by: As directed    1)Please take your medications as instructed 2)Follow up with your PCP next week.Do a CMP test during the follow up 3)Continue IV antibiotics till November 24.  Follow-up with ID as an outpatient.  Follow-up with orthopedics 4)You will be called by gastroenterology for follow-up appointment 5)Quit alcohol   Increase activity slowly   Complete by: As directed    No wound care   Complete by: As directed       Allergies as of 04/12/2024       Reactions   Oxycodone  Other (See Comments)   Constipation and lethargy - Patient does NOT want this ever prescribed again   Codeine Anxiety, Other (See Comments)   Made the patient feel jittery   Other Anxiety, Other (See Comments)   Prednisone , Medrol , and Decadron  - Prolonged jittery feelings        Medication List     TAKE these medications    albuterol  108 (90 Base) MCG/ACT inhaler Commonly known as: VENTOLIN  HFA Inhale 1-2 puffs into the lungs every 6 (six) hours as needed for wheezing or shortness of breath.   Artificial Tears 1 % ophthalmic solution Generic drug: carboxymethylcellulose Place 1 drop into both eyes 3 (three) times daily as needed (for dryness).   aspirin EC 81 MG tablet Take 1 tablet (81 mg total) by mouth 2 (two) times daily for 28 days. Swallow whole. What changed: when to take this   b complex vitamins capsule Take 1 capsule by mouth daily with lunch.   ceFAZolin  IVPB Commonly known as: ANCEF  Inject 2 g into the vein every 8 (eight) hours. Indication:  Right Ankle Osteomyelitis First Dose: Yes Last Day of Therapy:  05/02/2024 Labs - Once weekly:   CBC/D and BMP, Labs - Once weekly: ESR and CRP Method of administration: IV Push Method of administration may be changed at the discretion of home infusion pharmacist based upon assessment of the patient and/or caregiver's ability to self-administer the medication ordered.   clobetasol  cream 0.05 % Commonly known as: TEMOVATE  Apply topically 2 (two) times daily. Apply to Psoriatic plaques on the extremities, trunk or scalp. DO not apply to face, intertriginous areas (armpits, groin, under pannus, gluteal cleft), or genitals. What changed:  how much to take when to take this reasons to take this additional instructions   Cosentyx UnoReady 300 MG/2ML Soaj Generic drug: Secukinumab Inject 300mg  into the skin at Weeks 0, 1, 2, 3   folic acid  1 MG tablet Commonly known as: FOLVITE  Take 1 mg by mouth daily with lunch.   furosemide  40 MG tablet Commonly known as: LASIX  Take 1 tablet (40 mg total) by mouth in the morning.   lactulose 10 GM/15ML solution Commonly known as: CHRONULAC Take 30 mLs (20 g total) by mouth 3 (three) times daily. What changed: how much to take   methocarbamol  750 MG tablet Commonly known as: ROBAXIN  Take 1 tablet (750 mg total) by mouth every 6 (six) hours as needed for muscle spasms.  nadolol  20 MG tablet Commonly known as: Corgard  Take 1 tablet (20 mg total) by mouth daily. Start taking on: April 13, 2024   pantoprazole  40 MG tablet Commonly known as: PROTONIX  TAKE 1 TABLET BY MOUTH TWICE A DAY What changed: when to take this   spironolactone  100 MG tablet Commonly known as: ALDACTONE  Take 1 tablet (100 mg total) by mouth daily. Start taking on: April 13, 2024 What changed:  medication strength how much to take   Tylenol  325 MG tablet Generic drug: acetaminophen  Take 325-650 mg by mouth every 6 (six) hours as needed for mild pain (pain score 1-3) (or headaches).   VITAMIN B12 PO Take 1 tablet by mouth daily with lunch.   VITAMIN D -3  PO Take 1 capsule by mouth daily with lunch.   Zinc 50 MG Tabs Take 50 mg by mouth daily with lunch.        Follow-up Information     [facility] Follow up.   Specialty: DME Services Why: This provider will continue Home Health RN and IV antibiotics after discharge. Contact information: [address] [place]  [address] [phone]        [facility] Follow up.   Specialty: Home Health Services Why: This provider will reach out to you 24-48 hours after discharge to begin Home Health PT services. Contact information: [address] [place] [address] [phone]         Shila Gustav GAILS, MD Follow up.   Specialty: Gastroenterology Why: tucker be called for appointment Contact information: [address] [place] [address] [phone]                Allergies  Allergen Reactions   Oxycodone  Other (See Comments)    Constipation and lethargy - Patient does NOT want this ever prescribed again   Codeine Anxiety and Other (See Comments)    Made the patient feel jittery   Other Anxiety and Other (See Comments)    Prednisone , Medrol , and Decadron  - Prolonged jittery feelings    Consultations: Orthopedics,pccm   Procedures/Studies: DG CHEST PORT 1 VIEW Result Date: 04/12/2024 EXAM: 1 VIEW(S) XRAY OF THE CHEST 04/11/2024 11:17:00 AM COMPARISON: 04/11/2024 CLINICAL HISTORY: Pleural effusion FINDINGS: LINES, TUBES AND DEVICES: Stable right PICC line. LUNGS AND PLEURA: Decreased right pleural effusion. No pneumothorax status post thoracentesis. Improved right lung aeration with residual right mid to lower lung opacity. No pulmonary edema. HEART AND MEDIASTINUM: No acute abnormality of the cardiac and mediastinal silhouettes. BONES AND SOFT TISSUES: No acute osseous abnormality. IMPRESSION: 1. Decreased right pleural effusion following thoracentesis,  without pneumothorax. 2. Improved right lung aeration with residual right mid to lower lung opacity. Electronically signed by: Waddell Calk MD 04/12/2024 08:28 AM EST RP Workstation: [id number]   DG Chest Port 1 View Result Date: 04/11/2024 EXAM: 1 VIEW(S) XRAY OF THE CHEST 04/11/2024 06:52:00 AM COMPARISON: Portable chest yesterday at 09/33/2025 09:33:00 AM. CLINICAL HISTORY: 142230 Pleural effusion 142230 Pleural effusion. FINDINGS: LUNGS AND PLEURA: Today there is increased patchy hazy atelectasis versus infiltrate in the left lower lung field while the left mid and upper lung remain clear. There is worsening near-complete opacification of the right thorax likely a combination of a large pleural effusion and compressive lung collapse. Only a small perihilar portion of the lung remains partially aerated. No pulmonary edema. No pneumothorax. HEART AND MEDIASTINUM: There is no mediastinal shift. The mediastinum is stable. Right heart  and mediastinal borders are obscured. Vessel markings are of normal caliber on the left. BONES AND SOFT TISSUES: No acute osseous abnormality. IMPRESSION: 1. Worsening near-complete opacification of the right hemithorax, likely from a large pleural effusion with compressive lung collapse. Only a small perihilar portion of the right lung remains partially aerated. 2. Increased patchy hazy atelectasis versus infiltrate in the left lower lung field. Electronically signed by: Francis Quam MD 04/11/2024 07:09 AM EST RP Workstation: [id number]   DG CHEST PORT 1 VIEW Result Date: 04/10/2024 CLINICAL DATA:  Cough and shortness of breath EXAM: PORTABLE CHEST 1 VIEW COMPARISON:  Chest radiograph dated 03/28/2024 FINDINGS: Lines/tubes: Right upper extremity PICC tip projects over the chest tube. Lungs: Near-complete atelectasis of the right lung. Left lung is well inflated. Insert no focal Pleura: Increased large right pleural effusion.  No pneumothorax. Heart/mediastinum: Right heart  border is obscured. Bones: No acute osseous abnormality. IMPRESSION: Increased large right pleural effusion with near-complete atelectasis of the right lung. Electronically Signed   By: Limin  Xu M.D.   On: 04/10/2024 10:33   DG Ankle 2 Views Right Result Date: 04/08/2024 CLINICAL DATA:  Irrigation and debridement of right ankle infection, hardware removal EXAM: DG ANKLE 2V *R* COMPARISON:  03/20/2024 FINDINGS: Frontal and lateral views of the right ankle are obtained. The plate and screw fixation across the distal fibula has been removed in the interim. Remaining orthopedic hardware is unremarkable. There are no acute displaced fractures. Stable osteoarthritis of the ankle and hindfoot. Diffuse soft tissue swelling throughout the visualized ankle and foot. IMPRESSION: 1. No complication after fibular hardware removal. 2. Diffuse soft tissue swelling. 3. Stable multifocal osteoarthritis. Electronically Signed   By: Ozell Daring M.D.   On: 04/08/2024 21:04   IR THORACENTESIS ASP PLEURAL SPACE W/IMG GUIDE Result Date: 04/05/2024 INDICATION: Patient with recurrent right pleural effusion. Underwent thoracentesis 03/20/24 (2.2L) and 02/21/24 (650 mL). He is currently inpatient, presenting with [facility]. History of cirrhosis. EXAM: ULTRASOUND GUIDED DIAGNOSTIC AND THERAPEUTIC RIGHT THORACENTESIS MEDICATIONS: 10 mL 1% lidocaine  with epi COMPLICATIONS: None immediate. PROCEDURE: An ultrasound guided thoracentesis was thoroughly discussed with the patient and questions answered. The benefits, risks, alternatives and complications were also discussed. The patient understands and wishes to proceed with the procedure. Written consent was obtained. Ultrasound was performed to localize and mark an adequate pocket of fluid in the right chest. The area was then prepped and draped in the normal sterile fashion. 1% Lidocaine  was used for local anesthesia. Under ultrasound guidance a 6 Fr Safe-T-Centesis catheter was introduced.  Thoracentesis was performed. The catheter was removed and a dressing applied. CXR ordered. FINDINGS: A total of approximately 1.75 liters of hazy, yellow fluid was removed. Samples were sent to the laboratory as requested by the clinical team. IMPRESSION: Successful ultrasound guided right thoracentesis yielding 1.75 liters of pleural fluid. The patient has required >/=2 thoracenteses (for hepatic hydrothorax) in a 30 day period and a formal evaluation by the [facility] Interventional Radiology Portal Hypertension Clinic has been arranged per 03/20/24 procedure note by Dr. Hughes. Performed and dictated by Laymon Coast, NP under the supervision of Dr. Philip. Electronically Signed   By: Juliene Philip M.D.   On: 04/05/2024 18:22   DG Chest 1 View Result Date: 04/05/2024 EXAM: 1 VIEW(S) XRAY OF THE CHEST 04/05/2024 05:21:03 PM COMPARISON: 10:28 04/05/2024, 03/22/2024 CLINICAL HISTORY: [id number] Status post thoracentesis [id number]. Status post thoracentesis ; Right side 1.7 liters Status post thoracentesis ; Right side 1.7 liters FINDINGS: LINES, TUBES AND DEVICES:  Right upper extremity central venous catheter tip overlies the SVC. LUNGS AND PLEURA: Persistent airspace disease at the right base. Decreased right-sided pleural effusion with moderate residual, which may be loculated. No pulmonary edema. No definitive right pneumothorax. HEART AND MEDIASTINUM: Stable cardiomegaly and mild central congestion. BONES AND SOFT TISSUES: No acute osseous abnormality. IMPRESSION: 1. Decreased right-sided pleural effusion with moderate residual, which may be loculated. No visible right pneumothorax 2. Persistent right basilar airspace disease. 3. Stable cardiomegaly with mild central congestion. Electronically signed by: Luke Bun MD 04/05/2024 05:30 PM EDT RP Workstation: [id number]   DG Chest 2 View Result Date: 04/05/2024 EXAM: 2 VIEW(S) XRAY OF THE CHEST 04/05/2024 01:09:00 PM COMPARISON: Comparison 03/22/2024. CLINICAL  HISTORY: dib, pleural eff. dib, pleural eff; Pt states he has pleural effusion in right lung, symptoms include SOB. FINDINGS: LINES, TUBES AND DEVICES: Right-sided PICC line is again noted. LUNGS AND PLEURA: Large right pleural effusion is noted which is increased in size compared to prior exam. Small amount of aerated right upper lobe is noted. No pulmonary edema. No pneumothorax. HEART AND MEDIASTINUM: No acute abnormality of the cardiac and mediastinal silhouettes. BONES AND SOFT TISSUES: No acute osseous abnormality. IMPRESSION: 1. Large right pleural effusion, increased in size compared to the prior exam. Electronically signed by: Lynwood Seip MD 04/05/2024 01:32 PM EDT RP Workstation: [id number]   DG CHEST PORT 1 VIEW Result Date: 03/22/2024 EXAM: 1 VIEW(S) XRAY OF THE CHEST 03/22/2024 10:11:00 PM COMPARISON: Chest x-ray 03/20/2024. CLINICAL HISTORY: Status post PICC central line placement. FINDINGS: LINES, TUBES AND DEVICES: Interval placement of a right PICC with tip overlying the distal superior vena cava. LUNGS AND PLEURA: Low lung volumes, grossly stable to maybe slightly increased. Moderate to large volume right pleural effusion. Underlying airspace opacity of the right upper lobe. Left lung is clear. No left pleural effusion. No pneumothorax. No pulmonary edema. HEART AND MEDIASTINUM: Unchanged  cardiac and mediastinal silhouettes. BONES AND SOFT TISSUES: No acute osseous abnormality. IMPRESSION: 1. Interval placement of a right PICC with tip overlying the distal superior vena cava. 2. Moderate to large right pleural effusion, grossly stable to slightly increased. Underlying right upper lobe airspace opacity. Electronically signed by: Morgane Naveau MD 03/22/2024 10:18 PM EDT RP Workstation: [id number]   US  EKG SITE RITE Result Date: 03/22/2024 If Site Rite image not attached, placement could not be confirmed due to current cardiac rhythm.  DG Ankle 2 Views Right Result Date:  03/21/2024 CLINICAL DATA:  [id number] Surgery, elective [phone] Intraoperative right ankle EXAM: RIGHT ANKLE - 2 VIEW COMPARISON:  Radiographs 03/20/2024 and 03/13/2024. FINDINGS: C-arm fluoroscopy was provided in the operating room without the presence of a radiologist.1 second fluoroscopy time. 0.0245 mGy air kerma. Two C-arm fluoroscopic images were obtained intraoperatively and are submitted for post operative interpretation. Images demonstrate interval removal of the lateral fibular plate and screws. Cannulated screws remain within the distal fibular diaphysis and the medial malleolus. No evidence of acute fracture or dislocation. Surgical sponges are within the surgical bed on this intraoperative image. Please see intraoperative findings for further detail. IMPRESSION: Intraoperative fluoroscopic guidance for hardware removal. Electronically Signed   By: Elsie Perone M.D.   On: 03/21/2024 19:40   DG C-Arm 1-60 Min-No Report Result Date: 03/21/2024 Fluoroscopy was utilized by the requesting physician.  No radiographic interpretation.   US  THORACENTESIS ASP PLEURAL SPACE W/IMG GUIDE Result Date: 03/20/2024 INDICATION: 142230 Pleural effusion 142230 Patient with history of alcoholic cirrhosis, portal hypertension, splenomegaly, ascites, obesity, recurrent hepatic hydrothorax/right  pleural effusion, dyspnea ; request received for therapeutic right thoracentesis EXAM: ULTRASOUND GUIDED THERAPEUTIC RIGHT THORACENTESIS MEDICATIONS: 8 mL 1% lidocaine  with epinephrine  to skin/subcutaneous tissue COMPLICATIONS: None immediate. PROCEDURE: An ultrasound guided thoracentesis was thoroughly discussed with the patient and questions answered. The benefits, risks, alternatives and complications were also discussed. The patient understands and wishes to proceed with the procedure. Written consent was obtained. Ultrasound was performed to localize and mark an adequate pocket of fluid in the right chest. The area was then  prepped and draped in the normal sterile fashion. 1% Lidocaine  was used for local anesthesia. Under ultrasound guidance a 6 Fr Safe-T-Centesis catheter was introduced. Thoracentesis was performed. The catheter was removed and a dressing applied. FINDINGS: A total of approximately 2.2 L of slightly hazy, yellow fluid was removed. IMPRESSION: Successful ultrasound guided therapeutic RIGHT thoracentesis yielding 2.2 L of pleural fluid. Performed by: Franky Rakers, PA-C PLAN: The patient has required >/=2 thoracenteses (for hepatic hydrothorax) in a 30 day period and a formal evaluation by the [facility] Interventional Radiology Portal Hypertension Clinic has been arranged. Thom Hall, MD [facility] Electronically Signed   By: Thom Hall M.D.   On: 03/20/2024 19:31   DG Chest 1 View Result Date: 03/20/2024 CLINICAL DATA:  [id number] S/P thoracentesis [id number] EXAM: CHEST  1 VIEW COMPARISON:  Chest x-ray 03/20/2024 7:59 a.m. FINDINGS: The heart and mediastinal contours are within normal limits. No focal consolidation. No pulmonary edema. Improved but persistent at least moderate right pleural effusion. No pneumothorax. No acute osseous abnormality. IMPRESSION: Improved but persistent at least moderate right pleural effusion. Electronically Signed   By: Morgane  Naveau M.D.   On: 03/20/2024 13:53   VAS US  LOWER EXTREMITY VENOUS (DVT) (7a-7p) Result Date: 03/20/2024  Lower Venous DVT Study [name]  Date of Exam:   03/20/2024 Medical Rec #: [id number]       Accession #:    [id number] Date of Birth: 04-04-1976        Patient Gender: M Patient Age:   31 years Exam Location:  [facility] Procedure:      VAS US  LOWER EXTREMITY VENOUS (DVT) Referring Phys: WARREN BARRETT --------------------------------------------------------------------------------  Indications: Swelling, Edema, SOB, ulceration, and Pain.  Comparison Study: Previous  study on 10.1.2025. Performing Technologist: Edilia Elden Appl  Examination Guidelines: A complete evaluation includes B-mode imaging, spectral Doppler, color Doppler, and power Doppler as needed of all accessible portions of each vessel. Bilateral testing is considered an integral part of a complete examination. Limited examinations for reoccurring indications may be performed as noted. The reflux portion of the exam is performed with the patient in reverse Trendelenburg.  +---------+---------------+---------+-----------+----------+--------------+ RIGHT    CompressibilityPhasicitySpontaneityPropertiesThrombus Aging +---------+---------------+---------+-----------+----------+--------------+ CFV      Full           Yes      Yes                                 +---------+---------------+---------+-----------+----------+--------------+ SFJ      Full           Yes      Yes                                 +---------+---------------+---------+-----------+----------+--------------+ FV Prox  Full                                                        +---------+---------------+---------+-----------+----------+--------------+  FV Mid   Full                                                        +---------+---------------+---------+-----------+----------+--------------+ FV DistalFull                                                        +---------+---------------+---------+-----------+----------+--------------+ PFV      Full                                                        +---------+---------------+---------+-----------+----------+--------------+ POP      Full           Yes      Yes                                 +---------+---------------+---------+-----------+----------+--------------+ PTV      Full                                                        +---------+---------------+---------+-----------+----------+--------------+ PERO     Full                                                         +---------+---------------+---------+-----------+----------+--------------+   +----+---------------+---------+-----------+----------+--------------+ LEFTCompressibilityPhasicitySpontaneityPropertiesThrombus Aging +----+---------------+---------+-----------+----------+--------------+ CFV Full           Yes      Yes                                 +----+---------------+---------+-----------+----------+--------------+ SFJ Full           Yes      Yes                                 +----+---------------+---------+-----------+----------+--------------+     Summary: RIGHT: - There is no evidence of deep vein thrombosis in the lower extremity.  - No cystic structure found in the popliteal fossa.  LEFT: - No evidence of common femoral vein obstruction.   *See table(s) above for measurements and observations. Electronically signed by Gaile New MD on 03/20/2024 at 11:55:49 AM.    Final    DG Chest Port 1 View Result Date: 03/20/2024 EXAM: 1 VIEW(S) XRAY OF THE CHEST 03/20/2024 08:10:00 AM COMPARISON: 03/09/2024 CLINICAL HISTORY: SOB (shortness of breath) [id number]. SOB and right lateral ankle wound with swelling. FINDINGS: LUNGS AND PLEURA: Large right pleural effusion, increased from prior exam. Hazy opacity throughout right lung may represent atelectasis, though airspace disease cannot  be excluded. No pulmonary edema. No pneumothorax. HEART AND MEDIASTINUM: No acute abnormality of the cardiac and mediastinal silhouettes. BONES AND SOFT TISSUES: No acute osseous abnormality. IMPRESSION: 1. Large right pleural effusion, increased from prior exam. 2. Hazy opacity throughout the right lung that may represent atelectasis; superimposed airspace disease cannot be excluded. Electronically signed by: Waddell Calk MD 03/20/2024 08:20 AM EDT RP Workstation: [id number]   DG Ankle Complete Right Result Date: 03/20/2024 EXAM: 3 OR MORE VIEW(S) XRAY OF THE  RIGHT ANKLE 03/20/2024 08:10:00 AM CLINICAL HISTORY: lateral ankle wound. SOB and right lateral ankle wound with swelling. COMPARISON: 03/13/2024 FINDINGS: BONES AND JOINTS: Status post ORIF of lateral and medial malleolus. Intact orthopedic hardware without periprosthetic fracture or lucency. No focal osseous lesion. No joint dislocation. Small ankle joint effusion. SOFT TISSUES: Focus of soft tissue gas about the lateral malleolus. Interval progression of diffuse soft tissue edema about the ankle and foot with new marked dorsal soft tissue swelling. IMPRESSION: 1. Interval progression of diffuse soft tissue edema about the ankle and foot with new marked dorsal soft tissue swelling and small ankle joint effusion. 2. New foci of soft tissue gas about the lateral malleolus. 3. Status post ORIF of lateral and medial malleolus with intact orthopedic hardware. No radiographic signs of acute osteomyelitis though the presence of soft tissue gas is concerning for an underlying infection Electronically signed by: Waddell Calk MD 03/20/2024 08:20 AM EDT RP Workstation: [id number]   DG Ankle 2 Views Right Result Date: 03/13/2024 CLINICAL DATA:  Pain. EXAM: RIGHT ANKLE - 2 VIEW COMPARISON:  03/09/2024 FINDINGS: Stable surgical hardware in the distal tibia and fibula. No periprosthetic lucency. No acute fracture. Near complete tibial talar joint space loss. No erosion or periostitis. Small ankle joint effusion. Persistent but improving soft tissue edema. IMPRESSION: 1. Stable surgical hardware in the distal tibia and fibula. No acute osseous abnormality. 2. Near complete tibial talar joint space loss. 3. Small ankle joint effusion. Soft tissue edema persists but is improving. Electronically Signed   By: Andrea Gasman M.D.   On: 03/13/2024 15:03      Subjective: Patient seen and examined at bedside today.  Hemodynamically stable.  Remains on room air.  Appears comfortable.  Sitting at the edge of the bed.  Denied  any shortness of breath or cough.  Lower EXTR edema has improved.  We discussed about discharge planning.  Discharge Exam: Vitals:   04/11/24 1947 04/12/24 0437  BP: (!) 111/59 119/68  Pulse: 77 80  Resp: 18 18  Temp: 98.5 F (36.9 C) 98.5 F (36.9 C)  SpO2: 94% 90%   Vitals:   04/11/24 1252 04/11/24 1947 04/12/24 0437 04/12/24 0500  BP: 110/67 (!) 111/59 119/68   Pulse: 66 77 80   Resp:  18 18   Temp: 98.1 F (36.7 C) 98.5 F (36.9 C) 98.5 F (36.9 C)   TempSrc:  Oral Oral   SpO2: 93% 94% 90%   Weight:    108.2 kg  Height:        General: Pt is alert, awake, not in acute distress,obese Cardiovascular: RRR, S1/S2 +, no rubs, no gallops Respiratory: CTA bilaterally, no wheezing, no rhonchi, diminished air sound on the right side Abdominal: Soft, NT, ND, bowel sounds + Extremities: trace lower extremity edema, no cyanosis    The results of significant diagnostics from this hospitalization (including imaging, microbiology, ancillary and laboratory) are listed below for reference.     Microbiology: Recent Results (from the past 240  hours)  Resp panel by RT-PCR (RSV, Flu A&B, Covid) Anterior Nasal Swab     Status: None   Collection Time: 04/05/24  1:13 PM   Specimen: Anterior Nasal Swab  Result Value Ref Range Status   SARS Coronavirus 2 by RT PCR NEGATIVE NEGATIVE Final    Comment: (NOTE) SARS-CoV-2 target nucleic acids are NOT DETECTED.  The SARS-CoV-2 RNA is generally detectable in upper respiratory specimens during the acute phase of infection. The lowest concentration of SARS-CoV-2 viral copies this assay can detect is 138 copies/mL. A negative result does not preclude SARS-Cov-2 infection and should not be used as the sole basis for treatment or other patient management decisions. A negative result may occur with  improper specimen collection/handling, submission of specimen other than nasopharyngeal swab, presence of viral mutation(s) within the areas  targeted by this assay, and inadequate number of viral copies(<138 copies/mL). A negative result must be combined with clinical observations, patient history, and epidemiological information. The expected result is Negative.  Fact Sheet for Patients:  bloggercourse.com  Fact Sheet for Healthcare Providers:  seriousbroker.it  This test is no t yet approved or cleared by the [place]  FDA and  has been authorized for detection and/or diagnosis of SARS-CoV-2 by FDA under an Emergency Use Authorization (EUA). This EUA will remain  in effect (meaning this test can be used) for the duration of the COVID-19 declaration under Section 564(b)(1) of the Act, 21 U.S.C.section 360bbb-3(b)(1), unless the authorization is terminated  or revoked sooner.       Influenza A by PCR NEGATIVE NEGATIVE Final   Influenza B by PCR NEGATIVE NEGATIVE Final    Comment: (NOTE) The Xpert Xpress SARS-CoV-2/FLU/RSV plus assay is intended as an aid in the diagnosis of influenza from Nasopharyngeal swab specimens and should not be used as a sole basis for treatment. Nasal washings and aspirates are unacceptable for Xpert Xpress SARS-CoV-2/FLU/RSV testing.  Fact Sheet for Patients: bloggercourse.com  Fact Sheet for Healthcare Providers: seriousbroker.it  This test is not yet approved or cleared by the [place]  FDA and has been authorized for detection and/or diagnosis of SARS-CoV-2 by FDA under an Emergency Use Authorization (EUA). This EUA will remain in effect (meaning this test can be used) for the duration of the COVID-19 declaration under Section 564(b)(1) of the Act, 21 U.S.C. section 360bbb-3(b)(1), unless the authorization is terminated or revoked.     Resp Syncytial Virus by PCR NEGATIVE NEGATIVE Final    Comment: (NOTE) Fact Sheet for  Patients: bloggercourse.com  Fact Sheet for Healthcare Providers: seriousbroker.it  This test is not yet approved or cleared by the [place]  FDA and has been authorized for detection and/or diagnosis of SARS-CoV-2 by FDA under an Emergency Use Authorization (EUA). This EUA will remain in effect (meaning this test can be used) for the duration of the COVID-19 declaration under Section 564(b)(1) of the Act, 21 U.S.C. section 360bbb-3(b)(1), unless the authorization is terminated or revoked.  Performed at [facility], [address] [place], [place] [address]   Body fluid culture w Gram Stain     Status: None   Collection Time: 04/05/24  4:46 PM   Specimen: Pleural, Right; Body Fluid  Result Value Ref Range Status   Specimen Description   Final    Pleural R Performed at [facility], [address] [place], [place] [address]    Special Requests   Final    NONE Performed at [facility], [address] [place], [place] [address]    Gram Stain NO WBC SEEN NO ORGANISMS SEEN   Final   Culture   Final    NO GROWTH 3 DAYS Performed at [facility], [address], [place], [place] [address]    Report Status 04/09/2024 FINAL  Final     Labs: BNP (last 3 results) Recent Labs    02/20/24 1723 03/09/24 1351  BNP 33.0 30.6   Basic Metabolic Panel: Recent Labs  Lab 04/06/24 0100 04/07/24 0045 04/08/24 0123 04/09/24 0330 04/10/24 0344 04/11/24 0245 04/12/24 0844  NA 138   < > 141 138 136 136 136  K 3.7   < > 3.4* 4.0 3.7 3.5 3.8  CL 103   < > 102 101 98 97* 96*  CO2 32   < > 32 33* 34* 33* 31  GLUCOSE 91   < > 92 96 99 92 87  BUN 25*   < > 19 19 17 14 15   CREATININE 0.86   < > 0.93 0.82 0.82 0.76 0.89  CALCIUM 8.2*   < > 8.1* 8.1* 8.3* 8.2* 8.2*  MG 1.7  --   --   --   --   --   --    < > = values in this interval not displayed.   Liver Function  Tests: Recent Labs  Lab 04/06/24 0100 04/08/24 0123 04/10/24 0344 04/11/24 0245 04/12/24 0844  AST 86* 96* 112* 99* 85*  ALT 16 16 14 14 15   ALKPHOS 207* 229* 245* 228* 219*  BILITOT 3.0* 2.4* 3.0* 2.5* 2.2*  PROT 5.6* 6.2* 6.2* 6.2* 6.0*  ALBUMIN  2.1* 2.3* 2.3* 2.4* 2.1*   No results for input(s): LIPASE, AMYLASE in the last 168 hours. No results for input(s): AMMONIA in the last 168 hours. CBC: Recent Labs  Lab 04/05/24 1836 04/06/24 0100 04/07/24 0045  WBC 4.9 5.9 6.1  NEUTROABS 3.2  --   --   HGB 9.7* 9.1* 9.9*  HCT 31.0* 29.6* 31.1*  MCV 103.0* 103.1* 103.3*  PLT 103* 108* 108*   Cardiac Enzymes: No results for input(s): CKTOTAL, CKMB, CKMBINDEX, TROPONINI in the last 168 hours. BNP: Invalid input(s): POCBNP CBG: No results for input(s): GLUCAP in the last 168 hours. D-Dimer No results for input(s): DDIMER in the last 72 hours. Hgb A1c No results for input(s): HGBA1C in the last 72 hours. Lipid Profile No results for input(s): CHOL, HDL, LDLCALC, TRIG, CHOLHDL, LDLDIRECT in the last 72 hours. Thyroid  function studies No results for input(s): TSH, T4TOTAL, T3FREE, THYROIDAB in the last 72 hours.  Invalid input(s): FREET3 Anemia work up No results for input(s): VITAMINB12, FOLATE, FERRITIN, TIBC, IRON, RETICCTPCT in the last 72 hours. Urinalysis    Component Value Date/Time   COLORURINE AMBER (A) 03/20/2024 0832   APPEARANCEUR HAZY (A) 03/20/2024 0832   LABSPEC 1.031 (H) 03/20/2024 0832   PHURINE 5.0 03/20/2024 0832   GLUCOSEU NEGATIVE 03/20/2024 0832   GLUCOSEU NEG mg/dL 94/94/7990 7977   HGBUR NEGATIVE 03/20/2024 0832   BILIRUBINUR SMALL (A) 03/20/2024 0832   KETONESUR NEGATIVE 03/20/2024 0832   PROTEINUR 30 (A) 03/20/2024 0832   UROBILINOGEN 1 10/12/2007 2022   NITRITE NEGATIVE 03/20/2024 0832   LEUKOCYTESUR NEGATIVE 03/20/2024 0832   Sepsis Labs Recent Labs  Lab 04/05/24 1836  04/06/24 0100 04/07/24 0045  WBC 4.9 5.9 6.1   Microbiology Recent Results (from the past 240 hours)  Resp panel by RT-PCR (RSV, Flu A&B, Covid) Anterior Nasal  Swab     Status: None   Collection Time: 04/05/24  1:13 PM   Specimen: Anterior Nasal Swab  Result Value Ref Range Status   SARS Coronavirus 2 by RT PCR NEGATIVE NEGATIVE Final    Comment: (NOTE) SARS-CoV-2 target nucleic acids are NOT DETECTED.  The SARS-CoV-2 RNA is generally detectable in upper respiratory specimens during the acute phase of infection. The lowest concentration of SARS-CoV-2 viral copies this assay can detect is 138 copies/mL. A negative result does not preclude SARS-Cov-2 infection and should not be used as the sole basis for treatment or other patient management decisions. A negative result may occur with  improper specimen collection/handling, submission of specimen other than nasopharyngeal swab, presence of viral mutation(s) within the areas targeted by this assay, and inadequate number of viral copies(<138 copies/mL). A negative result must be combined with clinical observations, patient history, and epidemiological information. The expected result is Negative.  Fact Sheet for Patients:  bloggercourse.com  Fact Sheet for Healthcare Providers:  seriousbroker.it  This test is no t yet approved or cleared by the [place]  FDA and  has been authorized for detection and/or diagnosis of SARS-CoV-2 by FDA under an Emergency Use Authorization (EUA). This EUA will remain  in effect (meaning this test can be used) for the duration of the COVID-19 declaration under Section 564(b)(1) of the Act, 21 U.S.C.section 360bbb-3(b)(1), unless the authorization is terminated  or revoked sooner.       Influenza A by PCR NEGATIVE NEGATIVE Final   Influenza B by PCR NEGATIVE NEGATIVE Final    Comment: (NOTE) The Xpert Xpress SARS-CoV-2/FLU/RSV plus assay is  intended as an aid in the diagnosis of influenza from Nasopharyngeal swab specimens and should not be used as a sole basis for treatment. Nasal washings and aspirates are unacceptable for Xpert Xpress SARS-CoV-2/FLU/RSV testing.  Fact Sheet for Patients: bloggercourse.com  Fact Sheet for Healthcare Providers: seriousbroker.it  This test is not yet approved or cleared by the [place]  FDA and has been authorized for detection and/or diagnosis of SARS-CoV-2 by FDA under an Emergency Use Authorization (EUA). This EUA will remain in effect (meaning this test can be used) for the duration of the COVID-19 declaration under Section 564(b)(1) of the Act, 21 U.S.C. section 360bbb-3(b)(1), unless the authorization is terminated or revoked.     Resp Syncytial Virus by PCR NEGATIVE NEGATIVE Final    Comment: (NOTE) Fact Sheet for Patients: bloggercourse.com  Fact Sheet for Healthcare Providers: seriousbroker.it  This test is not yet approved or cleared by the [place]  FDA and has been authorized for detection and/or diagnosis of SARS-CoV-2 by FDA under an Emergency Use Authorization (EUA). This EUA will remain in effect (meaning this test can be used) for the duration of the COVID-19 declaration under Section 564(b)(1) of the Act, 21 U.S.C. section 360bbb-3(b)(1), unless the authorization is terminated or revoked.  Performed at [facility], [address], [place], [place] [address]   Body fluid culture w Gram Stain     Status: None   Collection Time: 04/05/24  4:46 PM   Specimen: Pleural, Right; Body Fluid  Result Value Ref Range Status   Specimen Description   Final    Pleural R Performed at [facility], [address] [place], [place] [address]    Special Requests   Final    NONE Performed at [facility], [address] [place], [place] [address]    Gram Stain NO  WBC SEEN NO ORGANISMS SEEN   Final   Culture   Final    NO GROWTH 3 DAYS Performed at [facility], [address] [place], [place] [address]    Report Status 04/09/2024 FINAL  Final    Please note: You were cared for by a hospitalist during your hospital stay. Once you are discharged, your primary care physician will handle any further medical issues. Please note that NO REFILLS for any discharge medications will be authorized once you are discharged, as it is imperative that you return to your primary care physician (or establish a relationship with a primary care physician if you do not have one) for your post hospital discharge needs so that they can reassess your need for medications and monitor your lab values.    Time coordinating discharge: 40 minutes  SIGNED:   Ivonne Mustache, MD  [facility] 04/12/2024, 1:11 PM Pager [phone]  If 7PM-7AM, please contact night-coverage [email or web address] Password [email or web address]

## 2024-04-12 NOTE — Progress Notes (Addendum)
     Subjective:  Thomas [name] seen this morning.  Dressing changed again.  Wound appears to be healing appropriately,  albeit slowly.  No erythema or drainage.  Will plan to retain stitches another couple of days.  Possible suture removal at the end of the week.  This can be done in the office if he discharges home before then.  l administered Morphine  Milligram Equivalents: 0   Objective:   VITALS:   Vitals:   04/11/24 1252 04/11/24 1947 04/12/24 0437 04/12/24 0500  BP: 110/67 (!) 111/59 119/68   Pulse: 66 77 80   Resp:  18 18   Temp: 98.1 F (36.7 C) 98.5 F (36.9 C) 98.5 F (36.9 C)   TempSrc:  Oral Oral   SpO2: 93% 94% 90%   Weight:    108.2 kg  Height:        Sensation intact distally Dorsiflexion/Plantar flexion intact  lateral ankle incision is healing well no erythema or drainage.  Lab Results  Component Value Date   WBC 6.1 04/07/2024   HGB 9.9 (L) 04/07/2024   HCT 31.1 (L) 04/07/2024   MCV 103.3 (H) 04/07/2024   PLT 108 (L) 04/07/2024   BMET    Component Value Date/Time   NA 136 04/11/2024 0245   K 3.5 04/11/2024 0245   CL 97 (L) 04/11/2024 0245   CO2 33 (H) 04/11/2024 0245   GLUCOSE 92 04/11/2024 0245   BUN 14 04/11/2024 0245   CREATININE 0.76 04/11/2024 0245   CREATININE 0.66 06/14/2021 1419   CALCIUM 8.2 (L) 04/11/2024 0245   GFRNONAA >60 04/11/2024 0245   GFRNONAA >89 06/22/2013 0958      Xray: Status post partial hardware removal no adverse features.  Assessment/Plan:     Principal Problem:   Pleural effusion on right Active Problems:   Scrotal varices, left   Alcohol use disorder   OSA (obstructive sleep apnea)   Class 3 severe obesity due to excess calories with body mass index (BMI) of 50.0 to 59.9 in adult [facility])   Psoriatic arthritis (HCC)   Cirrhosis of liver (HCC)   Secondary esophageal varices with bleeding (HCC)   Wound infection complicating hardware, sequela  X-rays reviewed demonstrate advanced ankle arthritis no  acute fracture or other osseous normalities  S/p R ankle I&D for osteomyelitis and draining sinus 03/21/24    3 distal sutures were retained as this area has not yet completely healed.  A soft dressing was applied.  He can now be 50% weightbearing in cam boot.  Post op recs: WB: 50% PWB RLE in cam walker boot Antibiotics: Currently on scheduled Ancef  per infectious disease Dressing: Dry dressing with ABD and Ace wrap.  Change as needed. DVT prophylaxis: Lovenox  in house, discharge with aspirin 81mg  BID x4 weeks Follow up: Follow-up scheduled for next Thursday for wound check and suture removal Office Phone: [phone]    Ruble Buttler A Esmeralda Blanford 04/12/2024, 6:53 AM   Toribio Higashi, MD  Contact information:   [phone] 7am-5pm epic message Dr. Higashi, or call office for patient follow up: [phone] After hours and holidays please check [email or web address] for group call information for [facility]

## 2024-04-12 NOTE — Plan of Care (Signed)
  Problem: Education: Goal: Knowledge of General Education information will improve Description: Including pain rating scale, medication(s)/side effects and non-pharmacologic comfort measures Outcome: Progressing   Problem: Health Behavior/Discharge Planning: Goal: Ability to manage health-related needs will improve Outcome: Progressing   Problem: Clinical Measurements: Goal: Ability to maintain clinical measurements within normal limits will improve Outcome: Progressing Goal: Will remain free from infection Outcome: Progressing Goal: Diagnostic test results will improve Outcome: Progressing Goal: Respiratory complications will improve Outcome: Progressing   Problem: Activity: Goal: Risk for activity intolerance will decrease Outcome: Progressing   Problem: Nutrition: Goal: Adequate nutrition will be maintained Outcome: Progressing   Problem: Elimination: Goal: Will not experience complications related to bowel motility Outcome: Progressing Goal: Will not experience complications related to urinary retention Outcome: Progressing   Problem: Pain Managment: Goal: General experience of comfort will improve and/or be controlled Outcome: Progressing   Problem: Safety: Goal: Ability to remain free from injury will improve Outcome: Progressing   Problem: Skin Integrity: Goal: Risk for impaired skin integrity will decrease Outcome: Progressing

## 2024-04-12 NOTE — Progress Notes (Signed)
 PHARMACY CONSULT NOTE FOR:  OUTPATIENT  PARENTERAL ANTIBIOTIC THERAPY (OPAT)  Indication: Right Ankle Osteomyelitis Regimen: Cefazolin  2g IV every 8 hours End date: 05/02/2024   IV antibiotic discharge orders are pended. To discharging provider:  please sign these orders via discharge navigator,  Select New Orders & click on the button choice - Manage This Unsigned Work.     Thank you for allowing pharmacy to be a part of this patient's care.  Almarie Lunger, PharmD, BCPS, BCIDP Infectious Diseases Clinical Pharmacist 04/12/2024 2:12 PM   **Pharmacist phone directory can now be found on [email or web address] (PW [email or web address]).  Listed under [facility].

## 2024-04-12 NOTE — Progress Notes (Addendum)
 Physical Therapy Treatment Patient Details Name: Thomas [name] MRN: [id number] DOB: Jun 14, 1975 Today's Date: 04/12/2024   History of Present Illness Patient is a 48 year old male admitted with SOB, LLE edema, Dx of pleural effusion and s/p thoracentesis (1.75 L).   Patient was hospitalized from October 12 through October 22.  He had infection of the hardware in his right ankle and is status post removal of the hardware and is on outpatient IV antibiotics through PICC line.  WB status 50% RLE 04/07/24. Also admitted 9/13-9/17 with a pleural effusion.  PMHx includes psoriatic arthritis, psoriasis, ETOH cirrhosis, and obesity    PT Comments  Pt is progressing well with mobility. He tolerated increased ambulation distance of 46' with RW with good adherence to 50% partial weight bearing RLE. Reviewed HEP. Pt reports he has a ramp for getting into his home. PT goals met, will sign off, mobility team to follow. Pt is ready to DC from  PT standpoint.    If plan is discharge home, recommend the following: A little help with bathing/dressing/bathroom;Assist for transportation;Help with stairs or ramp for entrance   Can travel by private vehicle     Yes  Equipment Recommendations  None recommended by PT    Recommendations for Other Services       Precautions / Restrictions Precautions Precautions: Fall Recall of Precautions/Restrictions: Intact Required Braces or Orthoses: Other Brace Splint/Cast: Right ankle CAM boot Restrictions RLE Partial Weight Bearing Percentage or Pounds: 50 Other Position/Activity Restrictions: CAM boot to be worn whenever OOB     Mobility  Bed Mobility               General bed mobility comments: up in recliner    Transfers Overall transfer level: Needs assistance Equipment used: Rolling walker (2 wheels) Transfers: Sit to/from Stand Sit to Stand: Modified independent (Device/Increase time)                Ambulation/Gait Ambulation/Gait  assistance: Modified independent (Device/Increase time) Gait Distance (Feet): 90 Feet Assistive device: Rolling walker (2 wheels) Gait Pattern/deviations: Step-to pattern, Decreased step length - right, Decreased step length - left Gait velocity: decreased     General Gait Details: steady, no loss of balance, good adherence to 50% PWB status   Stairs             Wheelchair Mobility     Tilt Bed    Modified Rankin (Stroke Patients Only)       Balance Overall balance assessment: Needs assistance Sitting-balance support: No upper extremity supported, Feet unsupported Sitting balance-Leahy Scale: Good     Standing balance support: Bilateral upper extremity supported Standing balance-Leahy Scale: Fair                              Hotel Manager: No apparent difficulties  Cognition Arousal: Alert Behavior During Therapy: WFL for tasks assessed/performed                           PT - Cognition Comments: AxO x 3 pleasant and motivated.   Pt was IND/working/driving. Following commands: Intact      Cueing Cueing Techniques: Verbal cues  Exercises General Exercises - Lower Extremity Long Arc Quad: AROM, Right, 10 reps, Seated Hip Flexion/Marching: AROM, Right, Seated, 10 reps    General Comments        Pertinent Vitals/Pain Pain Assessment Pain Score: 0-No pain  Home Living                          Prior Function            PT Goals (current goals can now be found in the care plan section) Acute Rehab PT Goals Patient Stated Goal: to go dancing PT Goal Formulation: All assessment and education complete, DC therapy Progress towards PT goals: Goals met/education completed, patient discharged from PT    Frequency    Min 3X/week      PT Plan      Co-evaluation              AM-PAC PT 6 Clicks Mobility   Outcome Measure  Help needed turning from your back to your side while  in a flat bed without using bedrails?: None Help needed moving from lying on your back to sitting on the side of a flat bed without using bedrails?: None Help needed moving to and from a bed to a chair (including a wheelchair)?: None Help needed standing up from a chair using your arms (e.g., wheelchair or bedside chair)?: None Help needed to walk in hospital room?: None Help needed climbing 3-5 steps with a railing? : A Little 6 Click Score: 23    End of Session Equipment Utilized During Treatment: Gait belt Activity Tolerance: Patient tolerated treatment well Patient left: in chair;with call bell/phone within reach Nurse Communication: Mobility status;Other (comment);Weight bearing status PT Visit Diagnosis: Difficulty in walking, not elsewhere classified (R26.2)     Time: 8692-8665 PT Time Calculation (min) (ACUTE ONLY): 27 min  Charges:    $Gait Training: 8-22 mins $Therapeutic Activity: 8-22 mins PT General Charges $$ ACUTE PT VISIT: 1 Visit                    Sylvan Delon Copp PT 04/12/2024  Acute Rehabilitation Services  Office [phone]

## 2024-04-12 NOTE — Progress Notes (Signed)
 NAME:  Thomas [name], MRN:  [id number], DOB:  11-16-1975, LOS: 7 ADMISSION DATE:  04/05/2024, CONSULTATION DATE: 04/10/24 REFERRING MD: Dr. 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 Recently had thoracentesis with reaccumulation of fluid   History of alcoholic liver cirrhosis, psoriatic arthritis, psoriasis, obesity, recent infection of right ankle hardware Was admitted with shortness of breath found to have a large pleural effusion for which he had a thoracentesis Has had thoracentesis multiple times previously  Pertinent  Medical History   Past Medical History:  Diagnosis Date   Acute conjunctivitis of both eyes 10/22/2021   Acute sinusitis 06/16/2013   Alcoholic cirrhosis (HCC)    Alcoholic fatty liver 01/16/2010   Needs final HBV and HAV vaccines on or after 10/25/2012    Alcoholism (HCC) 12/25/2011   Allergic rhinitis    Childhood asthma    Elevated transaminase level 06/10/2007   AST: 80 ALT: 136 in 8/11: Hepatitis A., B and C negative.    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    Sleep apnea      Significant Hospital Events: Including procedures, antibiotic start and stop dates in addition to other pertinent events   10/28-thoracentesis for 1700 cc of fluid 11/2 consult to pulmonary 11/3-thoracentesis for 1800 cc of clear amber fluid  Interim History / Subjective:  Awake alert interactive Breathing feels good Did have some pain in his right thigh  Objective    Blood pressure 119/68, pulse 80, temperature 98.5 F (36.9 C), temperature source Oral, resp. rate 18, height 5' 5 (1.651 m), weight 108.2 kg, SpO2 90%.        Intake/Output Summary (Last 24 hours) at 04/12/2024  0820 Last data filed at 04/11/2024 2345 Gross per 24 hour  Intake 370 ml  Output 1100 ml  Net -730 ml   Filed Weights   04/10/24 0500 04/11/24 0500 04/12/24 0500  Weight: 115.3 kg 115.3 kg 108.2 kg    Examination: General: Middle-age, does not appear to be in distress HENT: Moist oral mucosa Lungs: Fair air entry bilaterally, improved air movement on the right Cardiovascular: S1-S2 appreciated soft, bowel sounds appreciated Abdomen: Soft, bowel sounds appreciated Extremities: No edema, dressings in place right foot Neuro: Awake alert oriented x 3, nonfocal exam  I reviewed last 24 h vitals and pain scores, last 48 h intake and output, last 24 h labs and trends, and last 24 h imaging results. 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 - Thoracentesis 1.7 L on 10/28, 1.8 L on 11/3 - Risk of reaccumulation of fluid is significant - Need to continue optimizing diuresis with spironolactone  and Lasix  - Currently on spironolactone  100 and Lasix  - Stable hemodynamics  Denies cramping in his legs but admits to pain in his left thigh - Will keep an eye on his electrolytes - Electrolyte shifts may cause the cramping, low magnesium may also cause this so may need repletion  If continues to be stable, dose of spironolactone  can be increased to 200 on 11/5  Has been off oxygen supplementation  Decompensated alcoholic cirrhosis - Follows up with [facility]  Hyperammonemia - On chronic lactulose  Psoriatic arthritis  Follow labs, replete electrolytes  Labs   CBC: Recent Labs  Lab 04/05/24 1331 04/05/24 1836 04/06/24 0100 04/07/24 0045  WBC 5.2 4.9 5.9 6.1  NEUTROABS 3.5 3.2  --   --   HGB 10.4* 9.7* 9.1* 9.9*  HCT 33.3* 31.0*  29.6* 31.1*  MCV 103.4* 103.0* 103.1* 103.3*  PLT 110* 103* 108* 108*    Basic Metabolic Panel: Recent Labs  Lab 04/06/24 0100 04/07/24 0045 04/08/24 0123 04/09/24 0330 04/10/24 0344 04/11/24 0245  NA 138 140 141 138 136 136  K 3.7 3.6 3.4* 4.0 3.7 3.5  CL 103 103 102 101 98 97*  CO2 32 31 32 33* 34* 33*  GLUCOSE 91 112* 92 96 99 92  BUN 25* 21* 19 19 17 14   CREATININE 0.86 1.01 0.93 0.82 0.82 0.76  CALCIUM 8.2* 8.0* 8.1* 8.1* 8.3* 8.2*  MG 1.7  --   --   --   --   --    GFR: Estimated Creatinine Clearance: 130.9 mL/min (by C-G formula based on SCr of 0.76 mg/dL). Recent Labs  Lab 04/05/24 1331 04/05/24 1836 04/06/24 0100 04/07/24 0045  WBC 5.2 4.9 5.9 6.1    Liver Function Tests: Recent Labs  Lab 04/05/24 1836 04/06/24 0100 04/08/24 0123 04/10/24 0344 04/11/24 0245  AST 86* 86* 96* 112* 99*  ALT 13 16 16 14 14   ALKPHOS 218* 207* 229* 245* 228*  BILITOT 3.2* 3.0* 2.4* 3.0* 2.5*  PROT 6.0* 5.6* 6.2* 6.2* 6.2*  ALBUMIN  2.4* 2.1* 2.3* 2.3* 2.4*   No results for input(s): LIPASE, AMYLASE in the last 168 hours. Recent Labs  Lab 04/05/24 1330  AMMONIA 42*    ABG    Component Value Date/Time   HCO3 27.4 12/26/2022 1657   TCO2 29 12/26/2022 1657   O2SAT 49 12/26/2022 1657     Coagulation Profile: Recent Labs  Lab 04/05/24 1331  INR 1.6*    Cardiac Enzymes: No results for input(s): CKTOTAL, CKMB, CKMBINDEX, TROPONINI in the last 168 hours.  HbA1C: Hgb A1c MFr Bld  Date/Time Value Ref Range Status  10/12/2007 03:47 PM 4.8 %     CBG: No results for input(s): GLUCAP in the last 168 hours.  Review of Systems:   Awake Interactive Denies any significant complaints Admits to shortness of breath  Past Medical History:  He,  has a past medical history of Acute conjunctivitis of both eyes (10/22/2021), Acute sinusitis (06/16/2013), Alcoholic cirrhosis (HCC), Alcoholic fatty liver (01/16/2010), Alcoholism (HCC) (12/25/2011),  Allergic rhinitis, Childhood asthma, Elevated transaminase level (06/10/2007), Gastroenteritis (08/26/2021), Hand pain, left (07/28/2022), Hordeolum externum of right upper eyelid (08/14/2022), IDA (iron deficiency anemia), Morbid obesity (HCC), Scrotal varices (01/07/2010), and Sleep apnea.   Surgical History:   Past Surgical History:  Procedure Laterality Date   ESOPHAGEAL BANDING  12/26/2022   Procedure: ESOPHAGEAL BANDING;  Surgeon: Shila Gustav GAILS, MD;  Location: [facility];  Service: Gastroenterology;;   ESOPHAGOGASTRODUODENOSCOPY (EGD) WITH PROPOFOL  N/A 07/05/2022   Procedure: ESOPHAGOGASTRODUODENOSCOPY (EGD) WITH PROPOFOL ;  Surgeon: Shila Gustav GAILS, MD;  Location: [facility];  Service: Gastroenterology;  Laterality: N/A;   ESOPHAGOGASTRODUODENOSCOPY (EGD) WITH PROPOFOL  N/A 12/26/2022   Procedure: ESOPHAGOGASTRODUODENOSCOPY (EGD) WITH PROPOFOL ;  Surgeon: Shila Gustav GAILS, MD;  Location: [facility];  Service: Gastroenterology;  Laterality: N/A;   ESOPHAGOGASTRODUODENOSCOPY (EGD) WITH PROPOFOL  N/A 02/12/2023   Procedure: ESOPHAGOGASTRODUODENOSCOPY (EGD)  WITH PROPOFOL ;  Surgeon: Shila Gustav GAILS, MD;  Location: [facility];  Service: Gastroenterology;  Laterality: N/A;   INCISION AND DRAINAGE OF DEEP ABSCESS, ANKLE Right 03/21/2024   Procedure: INCISION AND DRAINAGE OF DEEP ABSCESS, ANKLE;  Surgeon: Edna Toribio LABOR, MD;  Location: [facility];  Service: Orthopedics;  Laterality: Right;   IR THORACENTESIS ASP PLEURAL SPACE W/IMG GUIDE  04/05/2024   ORIF ANKLE FRACTURE Right 12/28/2022   Procedure: OPEN REDUCTION INTERNAL FIXATION (ORIF) ANKLE FRACTURE;  Surgeon: Josefina Chew, MD;  Location: [facility];  Service: Orthopedics;  Laterality: Right;     Social History:   reports that he has been smoking cigars. He has been exposed to tobacco smoke. He has never used smokeless tobacco. He reports that he does not currently use alcohol. He reports that he does not use drugs.    Family History:  His family history includes Depression in his mother; Diabetes in his father and another family member; Hypertension in his father; Liver disease in his father, mother, and sister. There is no history of Colon cancer, Esophageal cancer, Stomach cancer, or Colon polyps.   Allergies Allergies  Allergen Reactions   Oxycodone  Other (See Comments)    Constipation and lethargy - Patient does NOT want this ever prescribed again   Codeine Anxiety and Other (See Comments)    Made the patient feel jittery   Other Anxiety and Other (See Comments)    Prednisone , Medrol , and Decadron  - Prolonged jittery feelings    Jennet Epley, MD [facility] Pager: See Tracey

## 2024-04-12 NOTE — TOC Transition Note (Signed)
 Transition of Care [facility]) - Discharge Note   Patient Details  Name: JAYLUN FLEENER MRN: [id number] Date of Birth: 1975-11-15  Transition of [facility]) CM/SW Contact:  Heather DELENA Saltness, LCSW Phone Number: 04/12/2024, 9:33 AM   Clinical Narrative:    Pt discharging home today. HH PT services set up with [facility]. HH RN for IV antibiotics set up with [facility]. CSW spoke with Cindie at [name] and Pam at [facility] to confirm ability to accept pt for services. HH orders placed. D/C packet placed in pt's chart at RN station. PTAR called at 3:01 PM. Pt aware and in agreement with discharge plan. No further TOC needs at this time.   Final next level of care: Home w Home Health Services Barriers to Discharge: Barriers Resolved   Patient Goals and CMS Choice Patient states their goals for this hospitalization and ongoing recovery are:: To return home   Choice offered to / list presented to : Patient      Discharge Placement  Home with [facility] services              Patient to be transferred to facility by: PTAR Name of family member notified: Patient Patient and family notified of of transfer: 04/12/24  Discharge Plan and Services Additional resources added to the After Visit Summary for  Follow Up In-house Referral: Clinical Social Work Discharge Planning Services: NA Post Acute Care Choice: Home Health          DME Arranged: N/A DME Agency: NA       HH Arranged: PT, RN HH Agency: [facility], [facility] Date HH Agency Contacted: 04/12/24 Time HH Agency Contacted: [phone] Representative spoke with at [facility] Agency: Cindie at [place] / Pam at [facility]  Social Drivers of Health (SDOH) Interventions SDOH Screenings   Food Insecurity: No Food Insecurity (04/05/2024)  Housing: Low Risk  (04/05/2024)  Transportation Needs: No Transportation Needs (04/05/2024)  Utilities: Not At Risk (04/05/2024)  Depression (PHQ2-9): Low Risk  (03/31/2024)  Financial Resource Strain: Low Risk   (11/26/2023)  Physical Activity: Insufficiently Active (11/26/2023)  Social Connections: Socially Isolated (11/26/2023)  Stress: No Stress Concern Present (11/26/2023)  Tobacco Use: High Risk (04/05/2024)     Readmission Risk Interventions    04/06/2024   11:43 AM 03/23/2024    3:12 PM  Readmission Risk Prevention Plan  Transportation Screening Complete Complete  PCP or Specialist Appt within 5-7 Days  Complete  Home Care Screening  Complete  Medication Review (RN CM)  Complete  Medication Review (RN Care Manager) Complete   PCP or Specialist appointment within 3-5 days of discharge Complete   HRI or Home Care Consult Complete   SW Recovery Care/Counseling Consult Complete   Palliative Care Screening Not Applicable   Skilled Nursing Facility Not Applicable      Signed: Heather Saltness, MSW, LCSW Clinical Social Worker Inpatient Care Management 04/12/2024 3:04 PM

## 2024-04-12 NOTE — Telephone Encounter (Signed)
 Thomas [name], patient with history of alcohol induced cirrhosis was admitted to the hospital with fluid overload. Patient to be discharged home today. I received a message from hospitalist Dr. Jillian who requested GI follow up. Patient is known by Dr. Shila. Pls contact patient and schedule him for a hospital follow up with Dr. Nandigam or POD C APP in the next 2 to 3 weeks. THX.

## 2024-04-12 NOTE — Progress Notes (Signed)
 Discharge medications delivered to the patient at the bedside in a secure bag

## 2024-04-13 ENCOUNTER — Telehealth: Payer: Self-pay

## 2024-04-13 DIAGNOSIS — T847XXA Infection and inflammatory reaction due to other internal orthopedic prosthetic devices, implants and grafts, initial encounter: Secondary | ICD-10-CM | POA: Diagnosis not present

## 2024-04-13 NOTE — Transitions of Care (Post Inpatient/ED Visit) (Signed)
 04/13/2024  Name: Thomas [name] MRN: [id number] DOB: 1975-09-10  Today's TOC FU Call Status: Today's TOC FU Call Status:: Successful TOC FU Call Completed TOC FU Call Complete Date: 04/13/24 Patient's Name and Date of Birth confirmed.  Transition Care Management Follow-up Telephone Call Date of Discharge: 04/12/24 Discharge Facility: [facility]) Type of Discharge: Inpatient Admission How have you been since you were released from the hospital?: Better Any questions or concerns?: No  Items Reviewed: Did you receive and understand the discharge instructions provided?: Yes Medications obtained,verified, and reconciled?: Yes (Medications Reviewed) Any new allergies since your discharge?: No Dietary orders reviewed?: Yes Type of Diet Ordered:: low sodium heart healthy Do you have support at home?: No (Father just passed away, sister is keeping my dog, but she's trying to go back to work due to being with our dad while he was in hospice.)  Medications Reviewed Today: Medications Reviewed Today     Reviewed by Eilleen Richerd GRADE, RN (Registered Nurse) on 04/13/24 at 1142  Med List Status: <None>   Medication Order Taking? Sig Documenting Provider Last Dose Status Informant  acetaminophen  (TYLENOL ) 325 MG tablet [id number] Yes Take 325-650 mg by mouth every 6 (six) hours as needed for mild pain (pain score 1-3) (or headaches). [provider]  Active Self  albuterol  (VENTOLIN  HFA) 108 (90 Base) MCG/ACT inhaler [id number] Yes Inhale 1-2 puffs into the lungs every 6 (six) hours as needed for wheezing or shortness of breath. Simon Lavonia SAILOR, MD  Active Self  ARTIFICIAL TEARS 1 % ophthalmic solution [id number] Yes Place 1 drop into both eyes 3 (three) times daily as needed (for dryness). [provider]  Active Self  aspirin EC 81 MG tablet [id number] Yes Take 1 tablet (81 mg total) by mouth 2 (two) times daily for 28 days. Swallow whole. Renae Bernarda HERO, PA-C  Active  Self           Med Note [name], [name]   Tue Apr 05, 2024  4:14 PM)    b complex vitamins capsule [id number] Yes Take 1 capsule by mouth daily with lunch. [provider]  Active Self  ceFAZolin  (ANCEF ) IVPB [id number] Yes Inject 2 g into the vein every 8 (eight) hours for 20 days. Indication:  Right Ankle Osteomyelitis First Dose: Yes Last Day of Therapy:  05/02/2024 Labs - Once weekly:  CBC/D and BMP, Labs - Once weekly: ESR and CRP Method of administration: IV Push Method of administration may be changed at the discretion of home infusion pharmacist based upon assessment of the patient and/or caregiver's ability to self-administer the medication ordered. Manandhar, Sabina, MD  Active   Cholecalciferol (VITAMIN D -3 PO) [id number] Yes Take 1 capsule by mouth daily with lunch. [provider]  Active Self           Med Note [name], [name]   Tue Apr 05, 2024  6:29 PM) Strength not recalled  clobetasol  cream (TEMOVATE ) 0.05 % [id number]  Apply topically 2 (two) times daily. Apply to Psoriatic plaques on the extremities, trunk or scalp. DO not apply to face, intertriginous areas (armpits, groin, under pannus, gluteal cleft), or genitals.  Patient taking differently: Apply 1 Application topically 2 (two) times daily as needed (to Psoriatic plaques on the extremities, trunk or scalp. DO not apply to face, intertriginous areas (armpits, groin, under pannus, gluteal cleft), or genitals.).   Tobie Yetta HERO, MD  Active Self  Med Note [name], [name]   Tue Apr 05, 2024  4:14 PM)    Cyanocobalamin (VITAMIN B12 PO) [id number] Yes Take 1 tablet by mouth daily with lunch. [provider]  Active Self           Med Note [name], [name]   Tue Apr 05, 2024  6:29 PM) Strength not recalled  folic acid  (FOLVITE ) 1 MG tablet [id number] Yes Take 1 mg by mouth daily with lunch. [provider]  Active Self  furosemide  (LASIX ) 40 MG tablet [id number] Yes Take 1 tablet (40 mg  total) by mouth in the morning. Jillian Buttery, MD  Active   lactulose [facility]) 10 GM/15ML solution [id number] Yes Take 30 mLs (20 g total) by mouth 3 (three) times daily. Jillian Buttery, MD  Active   methocarbamol  (ROBAXIN ) 750 MG tablet [id number]  Take 1 tablet (750 mg total) by mouth every 6 (six) hours as needed for muscle spasms.  Patient not taking: Reported on 04/13/2024   Dibia, Landon BRAVO, MD  Active Self  nadolol  (CORGARD ) 20 MG tablet [id number]  Take 1 tablet (20 mg total) by mouth daily. Jillian Buttery, MD  Active   pantoprazole  (PROTONIX ) 40 MG tablet [id number]  TAKE 1 TABLET BY MOUTH TWICE A DAY  Patient taking differently: Take 40 mg by mouth 2 (two) times daily before a meal.   Berneta Elsie Sayre, MD  Active Self  Secukinumab (COSENTYX UNOREADY) 300 MG/2ML EMMANUEL [id number]  Inject 300mg  into the skin at [facility] 0, 1, 2, 3  Patient not taking: Reported on 04/05/2024   Jeannetta Lonni ORN, MD  Active Self           Med Note [name], [name]   Tue Apr 05, 2024  4:15 PM)    spironolactone  (ALDACTONE ) 100 MG tablet [id number] Yes Take 1 tablet (100 mg total) by mouth daily. Jillian Buttery, MD  Active   Zinc 50 MG TABS [id number] Yes Take 50 mg by mouth daily with lunch. [provider]  Active Self            Home Care and Equipment/Supplies: Were Home Health Services Ordered?: Yes Name of Home Health Agency:: [facility] Has Agency set up a time to come to your home?: No EMR reviewed for Home Health Orders: Orders present/patient has not received call (refer to CM for follow-up) (Notes reviewed and accepted by [facility] per ICM even with Medicaid - Patient to contact this writer by Friday, if no call) Any new equipment or medical supplies ordered?: No  Functional Questionnaire: Do you need assistance with bathing/showering or dressing?: Yes (I am managing but could use home health to help me out) Do you need assistance with meal preparation?: No (Using [employer] [facility] without problems) Do you need assistance with eating?: No Do you have difficulty maintaining continence: No Do you need assistance with getting out of bed/getting out of a chair/moving?: No (Has hospital bed) Do you have difficulty managing or taking your medications?: Yes ([facility] since [place] delivers, I have [facility] with no real results noted)  Follow up appointments reviewed: Date of PCP follow-up appointment?: 04/15/24 Follow-up Provider: Tinnie, NP Specialist Hospital Follow-up appointment confirmed?: Yes Date of Specialist follow-up appointment?: 04/28/24 Follow-Up Specialty Provider:: Montie Bologna, MD Infectious Disease Do you need transportation to your follow-up appointment?: No (I have a benefit with my insurance I will call in for) Do you understand  care options if your condition(s) worsen?: Yes-patient verbalized understanding    Goals Addressed             This Visit's Progress    VBCI Transitions of Care (TOC) Care Plan       Problems: (reactivated 04/13/24 due to hospitalized  Recent Hospitalization for treatment of Pulmonary Disease and Cellulitis right ankle infection with removal of hardware - osteomyelitis right ankle home IV therapy antibiotics - Outpatient Parenteral Antibiotic Therapy Information Antibiotic: Cefazolin  (Ancef ) IVPB; Indications for use: Right Ankle Osteomyelitis; End Date: 05/02/2024 - [facility] (04/13/24 - ongoing IV antibiotic) Hospital Bed - [facility], delivered Knowledge Deficit Related to post hospital care and s/s of infections, activity level, post hospital follow up. Explained to patient regarding recommendation for aspirin 81 milligram twice daily for 4 weeks for DVT prophylaxis.  04/13/24 Reviewed and ongoing  Goal:  Over the next 30 days, the patient will not experience hospital readmission  Interventions:  Transitions of Care: Doctor Visits  - discussed the  importance of doctor visits  Patient Self Care Activities:  Attend all scheduled provider appointments Call pharmacy for medication refills 3-7 days in advance of running out of medications Call provider office for new concerns or questions  Notify RN Care Manager of TOC call rescheduling needs Participate in Transition of Care Program/Attend TOC scheduled calls Take medications as prescribed   Patient encouraged to pick up or have Aspirin delivered Ongoing pain - post op reviewed, patient desires to not take Oxycodone  unless absolutely necessary, he states. Medications encouraged as prescribed. Reviewed measures to decrease pain such as resting limb, working with HHPT/OT on needs to maintain safety and best activity for his ankle surgery.    Plan:  Telephone follow up appointment with care management team member scheduled for:  ongoing follow up in 30 day TOC  program The care management team will reach out to the patient again over the next 7-10 business days. The patient has been provided with contact information for the care management team and has been advised to call with any health related questions or concerns.  The patient will call infectious disease provider for  as advised to symptoms of infection with PICC line or ankle, with swelling, increased pain, site with redness/pain.  Patient to get appointment with Orthopedic surgeon, patient plans to call his insurance company regarding his benefit for rides to appointments.  Patient desires medications to be delivery from [facility] however he has attempted [facility] have [facility] [place] [facility] assist without success. Will reach out to [facility] and sent via secure chat to Channing Mealing, Pharm for assistance/help. Discussed and offered 30 day TOC program.  Patient agrees to 30 day weekly calls.  The patient has been provided with contact information for the care management team and has been advised to call with any health -related  questions or concerns.  The patient verbalized understanding with current plan of care.  The patient is directed to their insurance card regarding availability of benefits coverage.           Richerd Fish, RN, BSN, CCM [facility], [facility] Management Coordinator Direct Dial: [phone]

## 2024-04-13 NOTE — Patient Instructions (Signed)
 Visit Information  Thank you for taking time to visit with me today. Please don't hesitate to contact me if I can be of assistance to you before our next scheduled telephone appointment.  Our next appointment is by telephone on April 20, 2024 at 11:00 AM  Following is a copy of your care plan:   Goals Addressed             This Visit's Progress    VBCI Transitions of Care (TOC) Care Plan       Problems:  Recent Hospitalization for treatment of Pulmonary Disease and Cellulitis right ankle infection with removal of hardware - osteomyelitis right ankle home IV therapy antibiotics - Outpatient Parenteral Antibiotic Therapy Information Antibiotic: Cefazolin  (Ancef ) IVPB; Indications for use: Right Ankle Osteomyelitis; [facility], delivered Knowledge Deficit Related to post hospital care and s/s of infections, activity level, post hospital follow up. Explained to patient regarding recommendation for aspirin 81 milligram twice daily for 4 weeks for DVT prophylaxis.   Goal:  Over the next 30 days, the patient will not experience hospital readmission  Interventions:  Transitions of Care: Doctor Visits  - discussed the importance of doctor visits  Patient Self Care Activities:  Attend all scheduled provider appointments Call pharmacy for medication refills 3-7 days in advance of running out of medications Call provider office for new concerns or questions  Notify RN Care Manager of TOC call rescheduling needs Participate in Transition of Care Program/Attend TOC scheduled calls Take medications as prescribed   Patient encouraged to pick up or have Aspirin delivered Ongoing pain - post op reviewed, patient desires to not take Oxycodone  unless absolutely necessary, he states. Medications encouraged as prescribed. Reviewed measures to decrease pain such as resting limb, working with HHPT/OT on needs to maintain safety and best activity for his ankle  surgery.    Plan:  Telephone follow up appointment with care management team member scheduled for:  ongoing follow up in 30 day TOC  program The care management team will reach out to the patient again over the next 7-10 business days. The patient has been provided with contact information for the care management team and has been advised to call with any health related questions or concerns.  The patient will call infectious disease provider for  as advised to symptoms of infection with PICC line or ankle, with swelling, increased pain, site with redness/pain.  Patient to get appointment with Orthopedic surgeon, patient plans to call his insurance company regarding his benefit for rides to appointments.         Patient verbalizes understanding of instructions and care plan provided today and agrees to view in MyChart. Active MyChart status and patient understanding of how to access instructions and care plan via MyChart confirmed with patient.     Telephone follow up appointment with care management team member scheduled for: The patient has been provided with contact information for the care management team and has been advised to call with any health related questions or concerns.  [facility] team will follow up with the patient and will provide direct communication to the PCP for this patient.  Offered VBCI social worker support follow up due to death of father. Patient agrees it maybe helpful but did not feel the need for right now.  Will continue to monitor and patient encouraged to call this clinical research associate.  Please call the care guide team at [phone] if you need to cancel  or reschedule your appointment.   Please call the Suicide and Crisis Lifeline: 988 call the [place]  National Suicide Prevention Lifeline: (912) 746-6828 or TTY: 214-342-0779 TTY (414)361-2960) to talk to a trained counselor call 1-800-273-TALK (toll free, 24 hour hotline) go to [facility]  Urgent Care [address], [place] [phone]) call 911 if you are experiencing a Mental Health or Behavioral Health Crisis or need someone to talk to.  Richerd Fish, RN, BSN, CCM [facility], [facility] Management Coordinator Direct Dial: [phone]

## 2024-04-15 ENCOUNTER — Telehealth: Payer: Self-pay

## 2024-04-15 ENCOUNTER — Ambulatory Visit: Admitting: Nurse Practitioner

## 2024-04-15 ENCOUNTER — Ambulatory Visit

## 2024-04-15 ENCOUNTER — Encounter: Payer: Self-pay | Admitting: Nurse Practitioner

## 2024-04-15 VITALS — BP 118/80 | HR 80 | Temp 98.0°F | Ht 65.0 in | Wt 263.8 lb

## 2024-04-15 DIAGNOSIS — D649 Anemia, unspecified: Secondary | ICD-10-CM | POA: Diagnosis not present

## 2024-04-15 DIAGNOSIS — K7030 Alcoholic cirrhosis of liver without ascites: Secondary | ICD-10-CM

## 2024-04-15 DIAGNOSIS — R0602 Shortness of breath: Secondary | ICD-10-CM | POA: Diagnosis not present

## 2024-04-15 DIAGNOSIS — T847XXS Infection and inflammatory reaction due to other internal orthopedic prosthetic devices, implants and grafts, sequela: Secondary | ICD-10-CM

## 2024-04-15 DIAGNOSIS — T847XXA Infection and inflammatory reaction due to other internal orthopedic prosthetic devices, implants and grafts, initial encounter: Secondary | ICD-10-CM | POA: Diagnosis not present

## 2024-04-15 DIAGNOSIS — J90 Pleural effusion, not elsewhere classified: Secondary | ICD-10-CM

## 2024-04-15 DIAGNOSIS — Z452 Encounter for adjustment and management of vascular access device: Secondary | ICD-10-CM | POA: Diagnosis not present

## 2024-04-15 LAB — CBC WITH DIFFERENTIAL/PLATELET
Basophils Absolute: 0.1 K/uL (ref 0.0–0.1)
Basophils Relative: 1.2 % (ref 0.0–3.0)
Eosinophils Absolute: 0.4 K/uL (ref 0.0–0.7)
Eosinophils Relative: 7 % — ABNORMAL HIGH (ref 0.0–5.0)
HCT: 33.3 % — ABNORMAL LOW (ref 39.0–52.0)
Hemoglobin: 11.2 g/dL — ABNORMAL LOW (ref 13.0–17.0)
Lymphocytes Relative: 17.2 % (ref 12.0–46.0)
Lymphs Abs: 1.1 K/uL (ref 0.7–4.0)
MCHC: 33.6 g/dL (ref 30.0–36.0)
MCV: 98.5 fl (ref 78.0–100.0)
Monocytes Absolute: 0.8 K/uL (ref 0.1–1.0)
Monocytes Relative: 12.9 % — ABNORMAL HIGH (ref 3.0–12.0)
Neutro Abs: 3.8 K/uL (ref 1.4–7.7)
Neutrophils Relative %: 61.7 % (ref 43.0–77.0)
Platelets: 150 K/uL (ref 150.0–400.0)
RBC: 3.38 Mil/uL — ABNORMAL LOW (ref 4.22–5.81)
RDW: 15.3 % (ref 11.5–15.5)
WBC: 6.1 K/uL (ref 4.0–10.5)

## 2024-04-15 LAB — COMPREHENSIVE METABOLIC PANEL WITH GFR
ALT: 9 U/L (ref 0–53)
AST: 73 U/L — ABNORMAL HIGH (ref 0–37)
Albumin: 2.3 g/dL — ABNORMAL LOW (ref 3.5–5.2)
Alkaline Phosphatase: 208 U/L — ABNORMAL HIGH (ref 39–117)
BUN: 21 mg/dL (ref 6–23)
CO2: 32 meq/L (ref 19–32)
Calcium: 8.2 mg/dL — ABNORMAL LOW (ref 8.4–10.5)
Chloride: 98 meq/L (ref 96–112)
Creatinine, Ser: 0.77 mg/dL (ref 0.40–1.50)
GFR: 107.24 mL/min (ref >60.00)
Glucose, Bld: 79 mg/dL (ref 70–99)
Potassium: 3.9 meq/L (ref 3.5–5.1)
Sodium: 137 meq/L (ref 135–145)
Total Bilirubin: 3.5 mg/dL — ABNORMAL HIGH (ref 0.2–1.2)
Total Protein: 6.4 g/dL (ref 6.0–8.3)

## 2024-04-15 LAB — VITAMIN B12: Vitamin B-12: >1500 pg/mL — ABNORMAL HIGH (ref 211–911)

## 2024-04-15 MED ORDER — FUROSEMIDE 80 MG PO TABS: 80.0000 mg | ORAL_TABLET | Freq: Every day | ORAL | 1 refills | Status: AC

## 2024-04-15 NOTE — Assessment & Plan Note (Signed)
 Fluid was drained during hospitalization x2, and breathing is better, but lung sounds diminished on the right. X-rays reviewed from hospitalization. Check chest x-ray today.

## 2024-04-15 NOTE — Patient Instructions (Signed)
 It was great to see you!  We are checking your labs today and will let you know the results via mychart/phone.   We are updating your chest xray   Increase lasix  to 80mg  daily   Let's follow-up in 2 weeks, sooner if you have concerns.  If a referral was placed today, you will be contacted for an appointment. Please note that routine referrals can sometimes take up to 3-4 weeks to process. Please call our office if you haven't heard anything after this time frame.  Take care,  Tinnie Harada, NP

## 2024-04-15 NOTE — Assessment & Plan Note (Signed)
 Liver enzymes are elevated, but stable. He has abstained from alcohol for three months. Low protein levels are likely due to liver dysfunction, and fatigue may be related to the liver condition and recent hospitalization. Continue protein supplementation with Ensure shakes and follow up with a GI specialist. Check CMP, CBC today. Medication reconciliation completed.

## 2024-04-15 NOTE — Progress Notes (Signed)
   04/15/2024  Patient ID: Thomas [name], male   DOB: January 21, 1976, 48 y.o.   MRN: [id number]  In basket message from [facility] nurse stating patient is having [facility].  Patient prefers to use [facility], because he can get medications delivered at no cost (transportation barrier).  [facility] attempted to get [facility] but was not successful.  Patient was seen by Tinnie Harada this this week, and furosemide  80mg  was sent to [facility].  He got a 60 day supply of nadalol and spironolactone  at hospital discharge on 10/22, so he has enough of these medications at this time.  Coordinating with Dr. Jeannetta to send a refill of folic acid  to [facility].  Coordinating with PCP to send pantoprazole  and methocarbamol .  Methocarbamol  was prescribed at hospital discharged, but [facility].  I will [facility] to cancel methocarbamol , folica acid, and pantoprazole  refills if orders are sent to [facility].  Patient has ASA 81mg  on hand and gets this OTC.  He also has Cosentyx on hand, and this [facility].  He also received lactulose at hospital discharge, and cefazolin  is provided by [facility]; so no other medications need to be sent to [facility] at this time.  [name], PharmD, DPLA

## 2024-04-15 NOTE — Progress Notes (Signed)
 Established Patient Office Visit  Subjective   Patient ID: Thomas [name], male    DOB: 1975-07-10  Age: 48 y.o. MRN: [id number]  Chief Complaint  Patient presents with   Hospitalization Follow-up    [facility] to ED on 04/05/24    HPI  Discussed the use of AI scribe software for clinical note transcription with the patient, who gave verbal consent to proceed.  History of Present Illness   Thomas [name] is a 48 year old male who presents for follow-up after recent hospitalization for ankle infection and liver enzyme stabilization.  Two months ago, he developed a swollen ankle, initially treated with cortisone, which temporarily reduced the swelling. Recurrence of swelling led to further cortisone treatments. An infection in the foot hardware and pleural effusion were discovered, necessitating ankle surgery and lung fluid drainage. He is currently on a PICC line for daily IV antibiotics until the end of November. While he was hospitalized, he had 2 thoracenteses completed on his right lung. He is currently taking 40mg  of lasix  daily and spironolactone  100mg  daily.   He experiences fatigue and a persistent cough, though breathing has improved. There are no fevers, abdominal pain, constipation, or diarrhea. Bowel movements are regular with lactulose use. He reports leg swelling, particularly in the morning, which resolves. He is on spironolactone  and Lasix  for fluid management as noted above but notes decreased urination frequency compared to his hospital stay.  Dietary intake has decreased since his father's passing, with efforts to increase protein consumption, including protein shakes. He has abstained from alcohol for three months. Mobility is aided by a walker and powered scooter, with transportation services for mobility and medication delivery.     Transition of Care Hospital Follow up.   Hospital/Facility: [facility]  D/C Physician:  Ivonne Mustache, MD D/C Date:  04/12/24  Records Requested: 04/15/24 Records Received: 04/15/24 Records Reviewed: 04/15/24  Diagnoses on Discharge: Recurrent right pleural effusion, volume overloaded/decompensated alcoholic cirrhosis, elevated bilirubin/mild elevated liver enzymes, elevated ammonia level, infected right ankle hardware, psoriatic arthritis, hypokalemia, normocytic anemia/thrombocytopenia, obesity, deconditioning  Date of interactive Contact within 48 hours of discharge: N/A Contact was through: N/A  Date of 7 day or 14 day face-to-face visit:    within 14 days  Outpatient Encounter Medications as of 04/15/2024  Medication Sig Note   acetaminophen  (TYLENOL ) 325 MG tablet Take 325-650 mg by mouth every 6 (six) hours as needed for mild pain (pain score 1-3) (or headaches).    albuterol  (VENTOLIN  HFA) 108 (90 Base) MCG/ACT inhaler Inhale 1-2 puffs into the lungs every 6 (six) hours as needed for wheezing or shortness of breath.    ARTIFICIAL TEARS 1 % ophthalmic solution Place 1 drop into both eyes 3 (three) times daily as needed (for dryness).    aspirin EC 81 MG tablet Take 1 tablet (81 mg total) by mouth 2 (two) times daily for 28 days. Swallow whole.    b complex vitamins capsule Take 1 capsule by mouth daily with lunch.    ceFAZolin  (ANCEF ) IVPB Inject 2 g into the vein every 8 (eight) hours for 20 days. Indication:  Right Ankle Osteomyelitis First Dose: Yes Last Day of Therapy:  05/02/2024 Labs - Once weekly:  CBC/D and BMP, Labs - Once weekly: ESR and CRP Method of administration: IV Push Method of administration may be changed at the discretion of home infusion pharmacist based upon assessment of the patient and/or caregiver's ability to self-administer the medication ordered.  Cholecalciferol (VITAMIN D -3 PO) Take 1 capsule by mouth daily with lunch. 04/05/2024: Strength not recalled   clobetasol  cream (TEMOVATE ) 0.05 % Apply topically 2 (two) times daily. Apply to Psoriatic plaques on the  extremities, trunk or scalp. DO not apply to face, intertriginous areas (armpits, groin, under pannus, gluteal cleft), or genitals. (Patient not taking: Reported on 04/15/2024)    Cyanocobalamin (VITAMIN B12 PO) Take 1 tablet by mouth daily with lunch. 04/05/2024: Strength not recalled   folic acid  (FOLVITE ) 1 MG tablet Take 1 mg by mouth daily with lunch.    furosemide  (LASIX ) 80 MG tablet Take 1 tablet (80 mg total) by mouth daily.    lactulose (CHRONULAC) 10 GM/15ML solution Take 30 mLs (20 g total) by mouth 3 (three) times daily.    nadolol  (CORGARD ) 20 MG tablet Take 1 tablet (20 mg total) by mouth daily.    pantoprazole  (PROTONIX ) 40 MG tablet TAKE 1 TABLET BY MOUTH TWICE A DAY    spironolactone  (ALDACTONE ) 100 MG tablet Take 1 tablet (100 mg total) by mouth daily.    Zinc 50 MG TABS Take 50 mg by mouth daily with lunch.    methocarbamol  (ROBAXIN ) 750 MG tablet Take 1 tablet (750 mg total) by mouth every 6 (six) hours as needed for muscle spasms. (Patient not taking: Reported on 04/15/2024)    Secukinumab (COSENTYX UNOREADY) 300 MG/2ML SOAJ Inject 300mg  into the skin at Weeks 0, 1, 2, 3 (Patient not taking: Reported on 04/15/2024)    [DISCONTINUED] furosemide  (LASIX ) 40 MG tablet Take 1 tablet (40 mg total) by mouth in the morning.    No facility-administered encounter medications on file as of 04/15/2024.    Diagnostic Tests Reviewed/Disposition: Reviewed on chart  Consults: Pulmonary, Ortho  Discharge Instructions -Low salt heart healthy diet -Follow-up with PCP next week -Continue IV antibiotics until 11/24, follow up with ID -Follow-up with GI -Increase activity slowly   Disease/illness Education: Discussed with patient  Home Health/Community Services Discussions/Referrals: N/A  Establishment or re-establishment of referral orders for community resources: N/A  Discussion with other health care providers: Reviewed notes  Assessment and Support of treatment regimen adherence:  Discussed with patient  Appointments Coordinated with:  Has follow-up scheduled with GI, ID  Education for self-management, independent living, and ADLs: Discussed with patient     ROS See pertinent positives and negatives per HPI.    Objective:     BP 118/80 (BP Location: Left Arm, Patient Position: Sitting, Cuff Size: Large)   Pulse 80   Temp 98 F (36.7 C)   Ht 5' 5 (1.651 m)   Wt 263 lb 12.8 oz (119.7 kg) Comment: with walker boot  SpO2 96%   BMI 43.90 kg/m    Physical Exam Vitals and nursing note reviewed.  Constitutional:      Appearance: Normal appearance.  HENT:     Head: Normocephalic.  Eyes:     Conjunctiva/sclera: Conjunctivae normal.  Cardiovascular:     Rate and Rhythm: Normal rate and regular rhythm.     Pulses: Normal pulses.     Heart sounds: Normal heart sounds.  Pulmonary:     Effort: Pulmonary effort is normal.     Comments: Decreased breath sounds in right lower lobe Abdominal:     Palpations: Abdomen is soft.     Tenderness: There is no abdominal tenderness.  Musculoskeletal:     Cervical back: Normal range of motion.     Left lower leg: Edema (3+ pitting edema) present.  Comments: Surgical boot on right foot, unable to assess fluid status  Skin:    General: Skin is warm.  Neurological:     General: No focal deficit present.     Mental Status: He is alert and oriented to person, place, and time.  Psychiatric:        Mood and Affect: Mood normal.        Behavior: Behavior normal.        Thought Content: Thought content normal.        Judgment: Judgment normal.    The 10-year ASCVD risk score (Arnett DK, et al., 2019) is: 1.2%    Assessment & Plan:   Problem List Items Addressed This Visit       Respiratory   Pleural effusion on right   Fluid was drained during hospitalization x2, and breathing is better, but lung sounds diminished on the right. X-rays reviewed from hospitalization. Check chest x-ray today.        Relevant Orders   DG Chest 2 View     Digestive   Alcoholic cirrhosis of liver (HCC) - Primary   Liver enzymes are elevated, but stable. He has abstained from alcohol for three months. Low protein levels are likely due to liver dysfunction, and fatigue may be related to the liver condition and recent hospitalization. Continue protein supplementation with Ensure shakes and follow up with a GI specialist. Check CMP, CBC today. Medication reconciliation completed.       Relevant Orders   CBC with Differential/Platelet (Completed)   Comprehensive metabolic panel with GFR (Completed)     Other   Wound infection complicating hardware, sequela   Following recent right ankle surgery, there is a hardware infection. He is receiving IV antibiotics via a PICC line, with no fever present, and stitches have been removed. Continue IV antibiotics via the PICC line until the end of November and follow up with an infectious disease specialist as scheduled.      Relevant Orders   CBC with Differential/Platelet (Completed)   Comprehensive metabolic panel with GFR (Completed)   Other Visit Diagnoses       Anemia, unspecified type       Low blood counts are possibly related to the liver condition. Check CBC, iron, ferritin, and B12 today.   Relevant Orders   Iron, TIBC and Ferritin Panel   Vitamin B12 (Completed)      Return in about 2 weeks (around 04/29/2024) for 2-3 weeks with Dr. berneta swelling.    Tinnie DELENA Harada, NP

## 2024-04-15 NOTE — Assessment & Plan Note (Signed)
 Following recent right ankle surgery, there is a hardware infection. He is receiving IV antibiotics via a PICC line, with no fever present, and stitches have been removed. Continue IV antibiotics via the PICC line until the end of November and follow up with an infectious disease specialist as scheduled.

## 2024-04-16 DIAGNOSIS — T847XXA Infection and inflammatory reaction due to other internal orthopedic prosthetic devices, implants and grafts, initial encounter: Secondary | ICD-10-CM | POA: Diagnosis not present

## 2024-04-16 LAB — IRON,TIBC AND FERRITIN PANEL
%SAT: 39 % (ref 20–48)
Ferritin: 78 ng/mL (ref 38–380)
Iron: 87 ug/dL (ref 50–180)
TIBC: 224 ug/dL — ABNORMAL LOW (ref 250–425)

## 2024-04-18 ENCOUNTER — Ambulatory Visit: Payer: Self-pay | Admitting: Nurse Practitioner

## 2024-04-18 ENCOUNTER — Telehealth: Payer: Self-pay

## 2024-04-18 MED ORDER — FOLIC ACID 1 MG PO TABS: 1.0000 mg | ORAL_TABLET | Freq: Every day | ORAL | 0 refills | Status: AC

## 2024-04-18 MED ORDER — METHOCARBAMOL 750 MG PO TABS: 750.0000 mg | ORAL_TABLET | Freq: Four times a day (QID) | ORAL | 0 refills | Status: AC | PRN

## 2024-04-18 MED ORDER — PANTOPRAZOLE SODIUM 40 MG PO TBEC: 40.0000 mg | DELAYED_RELEASE_TABLET | Freq: Two times a day (BID) | ORAL | 0 refills | Status: AC

## 2024-04-18 NOTE — Progress Notes (Signed)
   04/18/2024  Patient ID: Thomas [name], male   DOB: October 02, 1975, 48 y.o.   MRN: [id number]  New prescriptions for pantoprazole  40mg  BID and methocarbamol  750mg  q6h PRN were sent to [facility] by Dr. Berneta, and folic acid  1mg  daily was sent by Dr. Jeannetta.  I contacted [facility] to have them cancel active refills on file for these medications to prevent duplicate fills.  Furosemide  80mg   daily was sent to [facility] last week.  Per Lauren McElwee's notes from Thomas [name]'s visit last week, he should follow-up with Dr. Berneta around 11/21.  If nadolol  20mg  daily, spironolactone  100mg  daily, or lactulose are to be continued, new prescriptions will need to be sent to [facility] for these medications.    [name], PharmD, DPLA

## 2024-04-18 NOTE — Telephone Encounter (Signed)
 Pt made aware. I informed pt that I will be giving him a call back for follow up. Next available is in Dec need to see if this is okay or if Dr. Berneta will have to double book.    Copied from CRM [id number]. Topic: Clinical - Lab/Test Results >> Apr 18, 2024 11:32 AM Thomas [name] wrote: Reason for CRM: Patient called due to a missed call about his imaging results. Please call the patient back to relay his results to him.

## 2024-04-18 NOTE — Progress Notes (Signed)
   04/18/2024  Patient ID: Thomas [name] [place], male   DOB: [birth year], 48 y.o.   MRN: [id number]  Orders pending for pantoprazole  and methocarbamol  for Dr. Berneta to sign if in agreement.  [name], PharmD, DPLA

## 2024-04-18 NOTE — Telephone Encounter (Signed)
 Thanks

## 2024-04-20 ENCOUNTER — Other Ambulatory Visit: Payer: Self-pay

## 2024-04-20 ENCOUNTER — Telehealth: Payer: Self-pay

## 2024-04-20 NOTE — Patient Instructions (Addendum)
 Visit Information  Thank you for taking time to visit with me today. Please don't hesitate to contact me if I can be of assistance to you before our next scheduled telephone appointment.  Our next appointment is by telephone on 04/27/24 at 1300  Following is a copy of your care plan:   Goals Addressed             This Visit's Progress    [facility] Transitions of Care (TOC) Care Plan       Problems: (reactivated 04/13/24 due to hospitalized) Reviewed `04/20/24 Recent Hospitalization for treatment of Pulmonary Disease and Cellulitis right ankle infection with removal of hardware - osteomyelitis right ankle home IV therapy antibiotics - Outpatient Parenteral Antibiotic Therapy Information Antibiotic: Cefazolin  (Ancef ) IVPB; Indications for use: Right Ankle Osteomyelitis; End Date: 05/02/2024 - [facility] (04/13/24 - ongoing IV antibiotic) Hospital Bed - [employer], delivered Knowledge Deficit Related to post hospital care and s/s of infections, activity level, post hospital follow up. Explained to patient regarding recommendation for aspirin 81 milligram twice daily for 4 weeks for DVT prophylaxis.  04/13/24 Reviewed and ongoing 04/20/24 Reviewed with patient medications and patient is taking Lactulose but only once a day. Reviewed reason for Lactulose and patient is well versed  in medications. Ongoing encouragement to medication regimen. Patient awaiting follow up with PCP office regarding results and next steps with CXR. Patient states he started diuresing a lot yesterday, no more swelling and breathing is easier. 04/20/24 Spoke with patient about possibly getting a urinal since he states he is really going to the bathroom a lot and has to get to his wheelchair quickly to make it to the bathroom.  Verbalized understanding and states that would not be a problem to get even maybe delivered from [place].  Goal:  Over the next 30 days, the patient will not experience hospital readmission  Interventions:   Transitions of Care: Doctor Visits  - discussed the importance of doctor visits  Patient Self Care Activities:  Attend all scheduled provider appointments Call pharmacy for medication refills 3-7 days in advance of running out of medications Call provider office for new concerns or questions  Notify RN Care Manager of TOC call rescheduling needs Participate in Transition of Care Program/Attend TOC scheduled calls Take medications as prescribed   Patient encouraged to pick up or have Aspirin delivered Ongoing pain - post op reviewed, patient desires to not take Oxycodone  unless absolutely necessary, he states. Medications encouraged as prescribed. Reviewed measures to decrease pain such as resting limb, working with HHPT/OT on needs to maintain safety and best activity for his ankle surgery.   04/20/24 Patient desires HHOT he believes would be more beneficial at this time. Awaiting a call from HHPT for visit. Encouraged patient to speak with HHPT with referral and explained that PCP would have to give orders as well to get coverage. Patient verbalized understanding and will follow up.  Plan:  Telephone follow up appointment with care management team member scheduled for:  ongoing follow up in 30 day TOC  program The care management team will reach out to the patient again over the next 7-10 business days. The patient has been provided with contact information for the care management team and has been advised to call with any health related questions or concerns.  The patient will call infectious disease provider for  as advised to symptoms of infection with PICC line or ankle, with swelling, increased pain, site with redness/pain.  Patient to get appointment with Orthopedic  surgeon, patient plans to call his insurance company regarding his benefit for rides to appointments.  Patient desires medications to be delivery from [facility] however he has attempted to have [facility] to assist  without success. Will reach out to [facility] and sent via secure chat to Channing Mealing, Pharm for assistance/help. 04/20/24 Patient has been outreached and working with Theatre Manager for transition to [facility] for med delivery and was adult nurse, he states he needs to [employer] and plans to do so as well. 04/20/24 Follow up scheduled for next week, patient uses MyChart portal for information and appointments, confirmed. Discussed and offered 30 day TOC program.  Patient agrees to 30 day weekly calls.  The patient has been provided with contact information for the care management team and has been advised to call with any health -related questions or concerns.  The patient verbalized understanding with current plan of care.  The patient is directed to their insurance card regarding availability of benefits coverage.          Patient verbalizes understanding of instructions and care plan provided today and agrees to view in MyChart. Active MyChart status and patient understanding of how to access instructions and care plan via MyChart confirmed with patient.     The patient has been provided with contact information for the care management team and has been advised to call with any health related questions or concerns.   Please call the care guide team at [phone] if you need to cancel or reschedule your appointment.   Please call the Suicide and Crisis Lifeline: 988 call the [place]  National Suicide Prevention Lifeline: (651) 610-0271 or TTY: 419-605-4777 TTY 301-324-5191) to talk to a trained counselor call 1-800-273-TALK (toll free, 24 hour hotline) go to [facility] [address] [place] [phone]) call 911 if you are experiencing a Mental Health or Behavioral Health Crisis or need someone to talk to.  Richerd Fish, RN, BSN, CCM [facility], [facility] Management  Coordinator Direct Dial: [phone]

## 2024-04-20 NOTE — Transitions of Care (Post Inpatient/ED Visit) (Signed)
 Transition of Care week 2  Visit Note  04/20/2024  Name: Thomas [name] MRN: [id number]          DOB: 1975-07-20  Situation: Patient enrolled in [facility] 30-day program. Visit completed with patient by telephone.   Background:   Initial Transition Care Management Follow-up Telephone Call Discharge Date and Diagnosis: 04/12/24, Cellulitis; ORIF   Past Medical History:  Diagnosis Date   Acute conjunctivitis of both eyes 10/22/2021   Acute sinusitis 06/16/2013   Alcoholic cirrhosis (HCC)    Alcoholic fatty liver 01/16/2010   Needs final HBV and HAV vaccines on or after 10/25/2012    Alcoholism (HCC) 12/25/2011   Allergic rhinitis    Childhood asthma    Elevated transaminase level 06/10/2007   AST: 80 ALT: 136 in 8/11: Hepatitis A., B and C negative.    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    Sleep apnea     Assessment: Patient Reported Symptoms: Cognitive Cognitive Status: No symptoms reported, Able to follow simple commands, Alert and oriented to person, place, and time, Normal speech and language skills      Neurological Neurological Review of Symptoms: No symptoms reported Neurological Management Strategies: Medical device, Medication therapy, Routine screening  HEENT HEENT Symptoms Reported: No symptoms reported      Cardiovascular Cardiovascular Symptoms Reported: No symptoms reported Does patient have uncontrolled Hypertension?: No Cardiovascular Management Strategies: Activity, Adequate rest, Medication therapy, Routine screening  Respiratory Respiratory Symptoms Reported: No symptoms reported Respiratory Management Strategies: Medication therapy, Routine screening  Endocrine Endocrine Symptoms Reported: No symptoms reported Is patient diabetic?: No Endocrine Self-Management  Outcome: 4 (good)  Gastrointestinal Gastrointestinal Symptoms Reported: No symptoms reported Additional Gastrointestinal Details: Taking lactulose once daily Gastrointestinal Management Strategies: Medication therapy, Adequate rest    Genitourinary Genitourinary Symptoms Reported: No symptoms reported Genitourinary Management Strategies: Medication therapy  Integumentary Integumentary Symptoms Reported: Wound Additional Integumentary Details: No dressing possible ugly scar Skin Management Strategies: Medication therapy  Musculoskeletal Musculoskelatal Symptoms Reviewed: Joint pain, Unsteady gait, Weakness (it's been pretty mild) Other Musculoskeletal Symptoms: post surgical        Psychosocial Psychosocial Symptoms Reported: No symptoms reported, Depression - if selected complete PHQ 2-9 (I was feeling a bit down because I am not able to do for myself like I am accustom to do but my friends came in over the weekend and helped me so much and helped me clean up and put away stuff and relaxing a bit more)         There were no vitals filed for this visit. Pain Scale: 0-10 Pain Score: 3  Faces Pain Scale: Hurts a little bit Pain Type: Acute pain Pain Location: Ankle Pain Orientation: Right Pain Descriptors / Indicators: Aching Pain Onset: On-going Patients Stated Pain Goal: 2  Medications Reviewed Today     Reviewed by Eilleen Richerd GRADE, RN (Registered Nurse) on 04/20/24 at 1114  Med List Status: <None>   Medication Order Taking? Sig Documenting Provider Last Dose Status Informant  acetaminophen  (TYLENOL ) 325 MG tablet [id number]  Take 325-650 mg by mouth every 6 (six) hours as needed for mild pain (pain score 1-3) (or headaches). [provider]  Active Self  albuterol  (VENTOLIN  HFA) 108 (90 Base) MCG/ACT inhaler [id number]  Inhale 1-2 puffs into the lungs every 6 (  six) hours as needed for wheezing or shortness of breath. Simon Lavonia SAILOR, MD  Active Self  ARTIFICIAL TEARS  1 % ophthalmic solution [id number]  Place 1 drop into both eyes 3 (three) times daily as needed (for dryness). [provider]  Active Self  b complex vitamins capsule [id number]  Take 1 capsule by mouth daily with lunch. [provider]  Active Self  ceFAZolin  (ANCEF ) IVPB [id number]  Inject 2 g into the vein every 8 (eight) hours for 20 days. Indication:  Right Ankle Osteomyelitis First Dose: Yes Last Day of Therapy:  05/02/2024 Labs - Once weekly:  CBC/D and BMP, Labs - Once weekly: ESR and CRP Method of administration: IV Push Method of administration may be changed at the discretion of home infusion pharmacist based upon assessment of the patient and/or caregiver's ability to self-administer the medication ordered. Manandhar, Sabina, MD  Active   Cholecalciferol (VITAMIN D -3 PO) [id number]  Take 1 capsule by mouth daily with lunch. [provider]  Active Self           Med Note [name], [name]   Tue Apr 05, 2024  6:29 PM) Strength not recalled  clobetasol  cream (TEMOVATE ) 0.05 % [id number]  Apply topically 2 (two) times daily. Apply to Psoriatic plaques on the extremities, trunk or scalp. DO not apply to face, intertriginous areas (armpits, groin, under pannus, gluteal cleft), or genitals.  Patient not taking: Reported on 04/15/2024   Tobie Yetta HERO, MD  Active Self           Med Note [name], [name]   Tue Apr 05, 2024  4:14 PM)    Cyanocobalamin (VITAMIN B12 PO) [id number]  Take 1 tablet by mouth daily with lunch. [provider]  Active Self           Med Note [name], [name]   Tue Apr 05, 2024  6:29 PM) Strength not recalled  folic acid  (FOLVITE ) 1 MG tablet [id number]  Take 1 tablet (1 mg total) by mouth daily with lunch. Jeannetta Lonni ORN, MD  Active   furosemide  (LASIX ) 80 MG tablet [id number]  Take 1 tablet (80 mg total) by mouth daily. McElwee, Lauren A, NP  Active   lactulose (CHRONULAC) 10 GM/15ML solution [id number]  Take 30 mLs (20 g total) by mouth 3  (three) times daily. Jillian Buttery, MD  Active   methocarbamol  (ROBAXIN ) 750 MG tablet [id number]  Take 1 tablet (750 mg total) by mouth every 6 (six) hours as needed for muscle spasms. Berneta Elsie Sayre, MD  Active   nadolol  (CORGARD ) 20 MG tablet [id number]  Take 1 tablet (20 mg total) by mouth daily. Jillian Buttery, MD  Active   pantoprazole  (PROTONIX ) 40 MG tablet [id number]  Take 1 tablet (40 mg total) by mouth 2 (two) times daily. Berneta Elsie Sayre, MD  Active   Secukinumab [facility]) 300 MG/2ML EMMANUEL [id number]  Inject 300mg  into the skin at [facility] 0, 1, 2, 3  Patient not taking: Reported on 04/15/2024   Jeannetta Lonni ORN, MD  Active Self           Med Note [name], [name]   Tue Apr 05, 2024  4:15 PM)    spironolactone  (ALDACTONE ) 100 MG tablet [id number]  Take 1 tablet (100 mg total) by mouth daily. Jillian Buttery, MD  Active   Zinc 50 MG TABS [id number]  Take 50 mg by mouth daily with lunch. [provider]  Active Self  Recommendation:   Continue Current Plan of Care  Follow Up Plan:   Telephone follow-up in 1 week  Richerd Fish, RN, BSN, CCM [facility], [facility] Management Coordinator Direct Dial: [phone]

## 2024-04-21 ENCOUNTER — Telehealth: Payer: Self-pay | Admitting: Family Medicine

## 2024-04-21 NOTE — Telephone Encounter (Signed)
 Patient dropped off document Home Health Certificate (Order ID [id number]), to be filled out by provider. Patient requested to send it back via Fax within 7-days. Document is located in providers tray at front office.Please advise at [phone]

## 2024-04-23 DIAGNOSIS — T847XXA Infection and inflammatory reaction due to other internal orthopedic prosthetic devices, implants and grafts, initial encounter: Secondary | ICD-10-CM | POA: Diagnosis not present

## 2024-04-27 ENCOUNTER — Other Ambulatory Visit: Payer: Self-pay

## 2024-04-27 ENCOUNTER — Telehealth: Payer: Self-pay

## 2024-04-27 NOTE — Transitions of Care (Post Inpatient/ED Visit) (Signed)
 Transition of Care week 3  Visit Note  04/27/2024  Name: Thomas [name] MRN: [id number]          DOB: April 17, 1976  Situation: Patient enrolled in [facility] 30-day program. Visit completed with patient by telephone.   Background:   Initial Transition Care Management Follow-up Telephone Call Discharge Date and Diagnosis: 04/12/24, Cellulitis; ORIF   Past Medical History:  Diagnosis Date   Acute conjunctivitis of both eyes 10/22/2021   Acute sinusitis 06/16/2013   Alcoholic cirrhosis (HCC)    Alcoholic fatty liver 01/16/2010   Needs final HBV and HAV vaccines on or after 10/25/2012    Alcoholism (HCC) 12/25/2011   Allergic rhinitis    Childhood asthma    Elevated transaminase level 06/10/2007   AST: 80 ALT: 136 in 8/11: Hepatitis A., B and C negative.    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    Sleep apnea     Assessment: Patient Reported Symptoms: Cognitive Cognitive Status: Able to follow simple commands, Alert and oriented to person, place, and time, Normal speech and language skills      Neurological Neurological Review of Symptoms: No symptoms reported Neurological Management Strategies: Medical device, Medication therapy, Routine screening  HEENT HEENT Symptoms Reported: No symptoms reported HEENT Management Strategies: Routine screening HEENT Self-Management Outcome: 3 (uncertain)    Cardiovascular Cardiovascular Symptoms Reported: No symptoms reported Does patient have uncontrolled Hypertension?: No Cardiovascular Management Strategies: Activity, Adequate rest, Medication therapy, Routine screening  Respiratory Respiratory Symptoms Reported: No symptoms reported    Endocrine Endocrine Symptoms Reported: No symptoms reported Is patient diabetic?: No Endocrine Self-Management  Outcome: 4 (good)  Gastrointestinal Gastrointestinal Symptoms Reported: No symptoms reported Additional Gastrointestinal Details: Taking lactulose  Gastrointestinal Management Strategies: Medication therapy, Adequate rest Gastrointestinal Self-Management Outcome: 4 (good)    Genitourinary Genitourinary Symptoms Reported: No symptoms reported    Integumentary Integumentary Symptoms Reported: Wound Additional Integumentary Details: open to air, scar Skin Management Strategies: Medication therapy Skin Self-Management Outcome: 4 (good)  Musculoskeletal Musculoskelatal Symptoms Reviewed: Joint pain, Unsteady gait, Weakness Other Musculoskeletal Symptoms: post surgical Musculoskeletal Management Strategies: Medical device, Medication therapy Musculoskeletal Self-Management Outcome: 3 (uncertain)      Psychosocial Psychosocial Symptoms Reported: Report of significant loss, deaths, abandonment, traumatic incidents Behavioral Health Self-Management Outcome: 3 (uncertain) Major Change/Loss/Stressor/Fears (CP): Death of a loved one (Urn was delivered yesterday)     There were no vitals filed for this visit. Pain Scale: 0-10 Pain Score: 5  Pain Type: Chronic pain Pain Location: Ankle Pain Orientation: Right Pain Descriptors / Indicators: Aching, Jabbing Pain Onset: On-going Patients Stated Pain Goal: 2 Pain Intervention(s): Other (Comment) (Compressure sock up to knee)  Medications Reviewed Today     Reviewed by Eilleen Richerd GRADE, RN (Registered Nurse) on 04/27/24 at 1326  Med List Status: <None>   Medication Order Taking? Sig Documenting Provider Last Dose Status Informant  acetaminophen  (TYLENOL ) 325 MG tablet [id number] Yes Take 325-650 mg by mouth every 6 (six) hours as needed for mild pain (pain score 1-3) (or headaches). [provider]  Active Self  albuterol  (VENTOLIN  HFA) 108 (90 Base) MCG/ACT inhaler [id number] Yes Inhale 1-2 puffs into the lungs every 6 (six) hours as  needed for wheezing or shortness of breath. Simon Lavonia SAILOR, MD  Active Self  ARTIFICIAL TEARS 1 %  ophthalmic solution [id number] Yes Place 1 drop into both eyes 3 (three) times daily as needed (for dryness). [provider]  Active Self  b complex vitamins capsule [id number] Yes Take 1 capsule by mouth daily with lunch. [provider]  Active Self  ceFAZolin  (ANCEF ) IVPB [id number] Yes Inject 2 g into the vein every 8 (eight) hours for 20 days. Indication:  Right Ankle Osteomyelitis First Dose: Yes Last Day of Therapy:  05/02/2024 Labs - Once weekly:  CBC/D and BMP, Labs - Once weekly: ESR and CRP Method of administration: IV Push Method of administration may be changed at the discretion of home infusion pharmacist based upon assessment of the patient and/or caregiver's ability to self-administer the medication ordered. Manandhar, Sabina, MD  Active   Cholecalciferol (VITAMIN D -3 PO) [id number] Yes Take 1 capsule by mouth daily with lunch. [provider]  Active Self           Med Note [name], [name]   Tue Apr 05, 2024  6:29 PM) Strength not recalled  clobetasol  cream (TEMOVATE ) 0.05 % [id number]  Apply topically 2 (two) times daily. Apply to Psoriatic plaques on the extremities, trunk or scalp. DO not apply to face, intertriginous areas (armpits, groin, under pannus, gluteal cleft), or genitals.  Patient not taking: Reported on 04/15/2024   Tobie Yetta HERO, MD  Active Self           Med Note [name], [name]   Tue Apr 05, 2024  4:14 PM)    Cyanocobalamin  (VITAMIN B12 PO) [id number] Yes Take 1 tablet by mouth daily with lunch. [provider]  Active Self           Med Note [name], [name]   Tue Apr 05, 2024  6:29 PM) Strength not recalled  folic acid  (FOLVITE ) 1 MG tablet [id number] Yes Take 1 tablet (1 mg total) by mouth daily with lunch. Jeannetta Lonni ORN, MD  Active   furosemide  (LASIX ) 80 MG tablet [id number] Yes Take 1 tablet (80 mg total) by mouth daily.  McElwee, Lauren A, NP  Active   lactulose  (CHRONULAC ) 10 GM/15ML solution [id number]  Take 30 mLs (20 g total) by mouth 3 (three) times daily.  Patient not taking: Reported on 04/27/2024   Jillian Buttery, MD  Active   methocarbamol  (ROBAXIN ) 750 MG tablet [id number] Yes Take 1 tablet (750 mg total) by mouth every 6 (six) hours as needed for muscle spasms. Berneta Elsie Sayre, MD  Active   nadolol  (CORGARD ) 20 MG tablet [id number] Yes Take 1 tablet (20 mg total) by mouth daily. Jillian Buttery, MD  Active   pantoprazole  (PROTONIX ) 40 MG tablet [id number] Yes Take 1 tablet (40 mg total) by mouth 2 (two) times daily. Berneta Elsie Sayre, MD  Active   Secukinumab  (COSENTYX  UNOREADY) 300 MG/2ML EMMANUEL [id number]  Inject 300mg  into the skin at [facility] 0, 1, 2, 3  Patient not taking: Reported on 04/15/2024   Jeannetta Lonni ORN, MD  Active Self           Med Note [name], [name]   Tue Apr 05, 2024  4:15 PM)    spironolactone  (ALDACTONE ) 100 MG tablet [id number] Yes Take 1 tablet (100 mg total) by mouth daily. Jillian Buttery, MD  Active   Zinc 50 MG TABS [id number] Yes Take 50 mg by mouth daily with lunch. [provider]  Active Self            Recommendation:   Continue Current Plan  of Care Patient encouraged to discuss with providers regarding Lactulose.  Follow Up Plan:   Telephone follow-up 05/09/24 at 1300  Richerd Fish, RN, SCIENTIST, RESEARCH (PHYSICAL SCIENCES), CCM [employer], [facility] Management Coordinator Direct Dial: [phone]

## 2024-04-27 NOTE — Patient Instructions (Signed)
 Visit Information  Thank you for taking time to visit with me today. Please don't hesitate to contact me if I can be of assistance to you before our next scheduled telephone appointment.  Our next appointment is by telephone on 05/09/24 at 1300  Following is a copy of your care plan:   Goals Addressed             This Visit's Progress    VBCI Transitions of Care (TOC) Care Plan   On track    Problems: (reactivated 04/13/24 due to hospitalized) Reviewed 04/27/24 Recent Hospitalization for treatment of Pulmonary Disease and Cellulitis right ankle infection with removal of hardware - osteomyelitis right ankle home IV therapy antibiotics - Outpatient Parenteral Antibiotic Therapy Information Antibiotic: Cefazolin  (Ancef ) IVPB; Indications for use: Right Ankle Osteomyelitis; End Date: 05/02/2024 - [facility] (04/13/24 - ongoing IV antibiotic) Hospital Bed - [employer], delivered Knowledge Deficit Related to post hospital care and s/s of infections, activity level, post hospital follow up. Explained to patient regarding recommendation for aspirin 81 milligram twice daily for 4 weeks for DVT prophylaxis.  04/13/24 Reviewed and ongoing 04/20/24 Reviewed with patient medications and patient is taking Lactulose but only once a day. Reviewed reason for Lactulose and patient is well versed  in medications. Ongoing encouragement to medication regimen. Patient awaiting follow up with PCP office regarding results and next steps with CXR. Patient states he started diuresing a lot yesterday, no more swelling and breathing is easier. 04/27/24 Patient reviewed about the Lactulose and states not taking because he feels he has clarity and it's not needed, feels his amonma level 04/20/24 - 04/27/24 reviewed - Spoke with patient about possibly getting a urinal since he states he is really going to the bathroom a lot and has to get to his wheelchair quickly to make it to the bathroom.  Verbalized understanding and states that  would not be a problem to get even maybe delivered from [place].  Goal:  Over the next 30 days, the patient will not experience hospital readmission  Interventions:  Transitions of Care: Doctor Visits  - discussed the importance of doctor visits  Patient Self Care Activities:  Attend all scheduled provider appointments Call pharmacy for medication refills 3-7 days in advance of running out of medications Call provider office for new concerns or questions  Notify RN Care Manager of TOC call rescheduling needs Participate in Transition of Care Program/Attend TOC scheduled calls Take medications as prescribed   Patient encouraged to pick up or have Aspirin delivered Ongoing pain - post op reviewed, patient desires to not take Oxycodone  unless absolutely necessary, he states. Medications encouraged as prescribed. Reviewed measures to decrease pain such as resting limb, working with HHPT/OT on needs to maintain safety and best activity for his ankle surgery.   04/20/24 Patient desires HHOT he believes would be more beneficial at this time. Awaiting a call from HHPT for visit. Encouraged patient to speak with HHPT with referral and explained that PCP would have to give orders as well to get coverage. Patient verbalized understanding and will follow up. 04/27/24 Has appointment tomorrow with Infectious Disease follow up states IVPB antibiotic due to finished on 05/02/24.  Plan:  Telephone follow up appointment with care management team member scheduled for:  ongoing follow up in 30 day TOC  program The care management team will reach out to the patient again over the next 7-10 business days. The patient has been provided with contact information for the care management team and has  been advised to call with any health related questions or concerns.  The patient will call infectious disease provider for  as advised to symptoms of infection with PICC line or ankle, with swelling, increased pain, site  with redness/pain.  Patient to get appointment with Orthopedic surgeon, patient plans to call his insurance company regarding his benefit for rides to appointments.  Patient desires medications to be delivery from [facility] however he has attempted to have [facility] to assist without success. Will reach out to [facility] and sent via secure chat to Channing Mealing, Pharm for assistance/help. 04/20/24 Patient has been outreached and working with Theatre Manager for transition to [facility] for med delivery and was adult nurse, he states he needs to [employer] and plans to do so as well. 04/20/24 Follow up scheduled for next week, patient uses MyChart portal for information and appointments, confirmed. Discussed and offered 30 day TOC program.  Patient agrees to 30 day weekly calls.  The patient has been provided with contact information for the care management team and has been advised to call with any health -related questions or concerns.  The patient verbalized understanding with current plan of care.  The patient is directed to their insurance card regarding availability of benefits coverage.   04/27/24 Continue care plan - encouraged to seek out [facility] in which his father was in for support. State he has the papers but hasn't dove into it yet. Reminded of their availability to support and counsel. Patient expressed understanding. 04/27/24 Patient to call for transportation to appointment for tomorrow 04/28/24 describes no issues with medical transportation.        Patient verbalizes understanding of instructions and care plan provided today and agrees to view in MyChart. Active MyChart status and patient understanding of how to access instructions and care plan via MyChart confirmed with patient.     The patient has been provided with contact information for the care management team and has been advised to call with any health related questions or concerns.   Continue care plan and follow up with providers and medication needs. Patient was able to get all medications switched to [facility] for delivery he states.  Please call the care guide team at [phone] if you need to cancel or reschedule your appointment.   Please call the Suicide and Crisis Lifeline: 988 call the [place]  National Suicide Prevention Lifeline: (605) 075-4154 or TTY: (631) 719-5764 TTY 573 690 4848) to talk to a trained counselor call 1-800-273-TALK (toll free, 24 hour hotline) go to [facility] [address], [place] [phone]) call 911 if you are experiencing a Mental Health or Behavioral Health Crisis or need someone to talk to.  Richerd Fish, RN, BSN, CCM [facility], [facility] Management Coordinator Direct Dial: [phone]

## 2024-04-28 ENCOUNTER — Telehealth: Payer: Self-pay

## 2024-04-28 ENCOUNTER — Other Ambulatory Visit: Payer: Self-pay

## 2024-04-28 ENCOUNTER — Ambulatory Visit (INDEPENDENT_AMBULATORY_CARE_PROVIDER_SITE_OTHER): Admitting: Internal Medicine

## 2024-04-28 VITALS — BP 113/74 | HR 72 | Temp 98.2°F | Resp 16

## 2024-04-28 DIAGNOSIS — A4901 Methicillin susceptible Staphylococcus aureus infection, unspecified site: Secondary | ICD-10-CM

## 2024-04-28 DIAGNOSIS — E876 Hypokalemia: Secondary | ICD-10-CM | POA: Diagnosis not present

## 2024-04-28 DIAGNOSIS — M86079 Acute hematogenous osteomyelitis, unspecified ankle and foot: Secondary | ICD-10-CM | POA: Diagnosis not present

## 2024-04-28 DIAGNOSIS — Z23 Encounter for immunization: Secondary | ICD-10-CM

## 2024-04-28 DIAGNOSIS — K7030 Alcoholic cirrhosis of liver without ascites: Secondary | ICD-10-CM

## 2024-04-28 MED ORDER — CEFADROXIL 500 MG PO CAPS: 1000.0000 mg | ORAL_CAPSULE | Freq: Two times a day (BID) | ORAL | 0 refills | Status: DC

## 2024-04-28 NOTE — Progress Notes (Signed)
 RFV: MSSA osteomyelitis  Patient ID: Thomas [name], male   DOB: [birth year], 48 y.o.   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 Finishes abtx on 11/4, to convert to oral abtx   Etoh cirrhosis , non-immune to hep B = will give hep b hepislav today; will give 2nd dose at follow up   Will be starting cosentyx  for psoriasis  Outpatient Encounter Medications as of 04/28/2024  Medication Sig   acetaminophen  (TYLENOL ) 325 MG tablet Take 325-650 mg by mouth every 6 (six) hours as needed for mild pain (pain score 1-3) (or headaches).   albuterol  (VENTOLIN  HFA) 108 (90 Base) MCG/ACT inhaler Inhale 1-2 puffs into the lungs every 6 (six) hours as needed for wheezing or shortness of breath.   ARTIFICIAL TEARS 1 % ophthalmic solution Place 1 drop into both eyes 3 (three) times daily as needed (for dryness).   b complex vitamins capsule Take 1 capsule by mouth daily with lunch.   ceFAZolin  (ANCEF ) IVPB Inject 2 g into the vein every 8 (eight) hours for 20 days. Indication:  Right Ankle Osteomyelitis First Dose: Yes Last Day of Therapy:  05/02/2024 Labs - Once weekly:  CBC/D and BMP, Labs - Once weekly: ESR and CRP Method of administration: IV Push Method of administration may be changed at the discretion of home infusion pharmacist based upon assessment of the patient and/or caregiver's ability to self-administer the medication ordered.   Cholecalciferol (VITAMIN D -3 PO) Take 1 capsule by mouth daily with lunch.   Cyanocobalamin  (VITAMIN B12 PO) Take 1 tablet by mouth daily with lunch.   folic acid  (FOLVITE ) 1 MG tablet Take 1 tablet (1 mg total) by mouth daily with lunch.   furosemide  (LASIX ) 80 MG tablet Take 1 tablet (80 mg total) by mouth daily.   methocarbamol  (ROBAXIN ) 750 MG tablet Take 1 tablet (750 mg total) by mouth every 6 (six) hours as needed for muscle spasms.   nadolol  (CORGARD ) 20 MG tablet Take 1 tablet (20 mg  total) by mouth daily.   pantoprazole  (PROTONIX ) 40 MG tablet Take 1 tablet (40 mg total) by mouth 2 (two) times daily.   spironolactone  (ALDACTONE ) 100 MG tablet Take 1 tablet (100 mg total) by mouth daily.   Zinc 50 MG TABS Take 50 mg by mouth daily with lunch.   clobetasol  cream (TEMOVATE ) 0.05 % Apply topically 2 (two) times daily. Apply to Psoriatic plaques on the extremities, trunk or scalp. DO not apply to face, intertriginous areas (armpits, groin, under pannus, gluteal cleft), or genitals. (Patient not taking: Reported on 04/28/2024)   lactulose  (CHRONULAC ) 10 GM/15ML solution Take 30 mLs (20 g total) by mouth 3 (three) times daily. (Patient not taking: Reported on 04/28/2024)   Secukinumab  (COSENTYX  UNOREADY) 300 MG/2ML SOAJ Inject 300mg  into the skin at Weeks 0, 1, 2, 3 (Patient not taking: Reported on 04/28/2024)   No facility-administered encounter medications on file as of 04/28/2024.     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   Wound infection complicating hardware, initial encounter 03/20/2024   High risk medication use 02/29/2024   Pleural effusion associated with hepatic disorder 02/20/2024   Other ascites 06/18/2023   Cellulitis of abdominal wall 03/10/2023  Esophageal varices without bleeding (HCC) 02/13/2023   History of ETOH abuse 01/09/2023   Ankle fracture 01/07/2023   Localized edema 01/07/2023   Closed right ankle fracture 01/06/2023   Thrombocytopenia 01/06/2023   Cirrhosis of liver (HCC) 12/25/2022   Secondary esophageal varices with bleeding (HCC) 12/25/2022   Reactive airway disease 07/28/2022   Acute upper GI bleed 07/05/2022   Hematemesis with nausea 07/05/2022   Melena 07/05/2022   Pharyngitis 10/22/2021   Psoriatic arthritis (HCC)  10/03/2021   Viral upper respiratory tract infection 10/03/2021   Acute blood loss anemia 08/26/2021   Screening for tuberculosis 08/15/2021   Elevated BP without diagnosis of hypertension 06/14/2021   Need for immunization against influenza 06/14/2021   Alcoholic cirrhosis of liver (HCC) 09/26/2019   Class 3 severe obesity due to excess calories with body mass index (BMI) of 50.0 to 59.9 in adult [facility]) 09/26/2019   Healthcare maintenance 02/07/2019   Psoriasis 02/07/2019   Vitamin D  deficiency 05/07/2016   OSA (obstructive sleep apnea) 06/23/2013   Insomnia 06/16/2013   Alcohol  use disorder 12/25/2011   Scrotal varices, 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   Alcoholic fatty liver 01/16/2010   Allergic rhinitis 10/12/2007     There are no preventive care reminders to display for this patient.   Review of Systems 12 point ros is otherwise negative Physical Exam   BP 113/74   Pulse 72   Temp 98.2 F (36.8 C) (Oral)   Resp 16   SpO2 93%   Physical Exam  Constitutional: He is oriented to person, place, and time. He appears well-developed and well-nourished. No distress.  HENT:  Mouth/Throat: Oropharynx is clear and moist. No oropharyngeal exudate.  Cardiovascular: Normal rate, regular rhythm and normal heart sounds. Exam reveals no gallop and no friction rub.  No murmur heard.  Pulmonary/Chest: Effort normal and breath sounds normal. No respiratory distress. He has no wheezes.  Abdominal: Soft. Bowel sounds are normal. He exhibits no distension. There is no tenderness.  Lymphadenopathy:  He has no cervical adenopathy.  Neurological: He is alert and oriented to person, place, and time.  Skin: Skin is warm and dry. No rash noted. No erythema.  Psychiatric: He has a normal mood and affect. His behavior is normal.   Lab Results  Component Value Date   HEPBSAB NON REACTIVE 03/23/2024   No results found for: RPR, LABRPR  CBC Lab Results  Component Value Date   WBC 6.1 04/15/2024   RBC  3.38 (L) 04/15/2024   HGB 11.2 (L) 04/15/2024   HCT 33.3 (L) 04/15/2024   PLT 150.0 04/15/2024   MCV 98.5 04/15/2024   MCH 32.9 04/07/2024   MCHC 33.6 04/15/2024   RDW 15.3 04/15/2024   LYMPHSABS 1.1 04/15/2024   MONOABS 0.8 04/15/2024   EOSABS 0.4 04/15/2024    BMET Lab Results  Component Value Date   NA 137 04/15/2024   K 3.9 04/15/2024   CL 98 04/15/2024   CO2 32 04/15/2024   GLUCOSE 79 04/15/2024   BUN 21 04/15/2024   CREATININE 0.77 04/15/2024   CALCIUM 8.2 (L) 04/15/2024   GFRNONAA >60 04/12/2024   GFRAA >89 06/22/2013   Reviewed labs with him  Sed rate of 31 (down from 37) the week prior Crp 16 (down from 19) the week prior Cr 0.87 K+ 3.3   Assessment and Plan  MSSA ankle HW osteo = will finishes iv abtx 11/24, pull picc line. Start oral abtx on 11/15 x 4 wks  Cirrhosis = will give  hep B Vaccine #2 today to complete course  Hypokalemia = recommend diet supp with banana a day Rtc in 4 wk

## 2024-04-28 NOTE — Telephone Encounter (Signed)
 Per Dr.Snider stop IV abx and pull PICC line on 05/02/2024. Message sent to [facility].   Dayra Rapley SHAUNNA Letters, CMA

## 2024-04-29 DIAGNOSIS — T847XXA Infection and inflammatory reaction due to other internal orthopedic prosthetic devices, implants and grafts, initial encounter: Secondary | ICD-10-CM | POA: Diagnosis not present

## 2024-04-30 DIAGNOSIS — T847XXA Infection and inflammatory reaction due to other internal orthopedic prosthetic devices, implants and grafts, initial encounter: Secondary | ICD-10-CM | POA: Diagnosis not present

## 2024-05-03 ENCOUNTER — Ambulatory Visit: Admitting: Internal Medicine

## 2024-05-04 DIAGNOSIS — R6889 Other general symptoms and signs: Secondary | ICD-10-CM | POA: Diagnosis not present

## 2024-05-09 ENCOUNTER — Telehealth: Payer: Self-pay

## 2024-05-09 ENCOUNTER — Other Ambulatory Visit: Payer: Self-pay

## 2024-05-09 DIAGNOSIS — S82891D Other fracture of right lower leg, subsequent encounter for closed fracture with routine healing: Secondary | ICD-10-CM

## 2024-05-09 DIAGNOSIS — K7031 Alcoholic cirrhosis of liver with ascites: Secondary | ICD-10-CM

## 2024-05-09 DIAGNOSIS — R6889 Other general symptoms and signs: Secondary | ICD-10-CM | POA: Diagnosis not present

## 2024-05-09 DIAGNOSIS — F1011 Alcohol abuse, in remission: Secondary | ICD-10-CM

## 2024-05-09 NOTE — Transitions of Care (Post Inpatient/ED Visit) (Unsigned)
 Transition of Care week 4/5  Visit Note  05/09/2024  Name: Thomas [name] MRN: [id number]          DOB: [birth year]  Situation: Patient enrolled in [facility] 30-day program. Visit completed with patient by telephone.   Background:   Initial Transition Care Management Follow-up Telephone Call Discharge Date and Diagnosis: 04/12/24   Past Medical History:  Diagnosis Date   Acute conjunctivitis of both eyes 10/22/2021   Acute sinusitis 06/16/2013   Alcoholic cirrhosis (HCC)    Alcoholic fatty liver 01/16/2010   Needs final HBV and HAV vaccines on or after 10/25/2012    Alcoholism (HCC) 12/25/2011   Allergic rhinitis    Childhood asthma    Elevated transaminase level 06/10/2007   AST: 80 ALT: 136 in 8/11: Hepatitis A., B and C negative.    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    Sleep apnea     Assessment: Patient Reported Symptoms: Cognitive Cognitive Status: Able to follow simple commands, Alert and oriented to person, place, and time, Normal speech and language skills      Neurological Neurological Review of Symptoms: Numbness (Right thigh ongoing) Neurological Management Strategies: Activity, Adequate rest, Exercise, Medication therapy, Routine screening Neurological Self-Management Outcome: 3 (uncertain)  HEENT HEENT Symptoms Reported: No symptoms reported      Cardiovascular Cardiovascular Symptoms Reported: No symptoms reported Does patient have uncontrolled Hypertension?: No Cardiovascular Management Strategies: Activity, Adequate rest, Medication therapy, Routine screening Weight: 263 lb 1.9 oz (119.4 kg) Cardiovascular Self-Management Outcome: 4 (good)  Respiratory Respiratory Symptoms Reported: No symptoms reported    Endocrine Endocrine Symptoms Reported: No symptoms  reported    Gastrointestinal Gastrointestinal Symptoms Reported: No symptoms reported Additional Gastrointestinal Details: Patient reports not taking any Lactulose  due to have good frequent BMs at least twice a day and I have no mental clarity issues encouraged to discuss this with PCP Gastrointestinal Management Strategies: Diet modification    Genitourinary Genitourinary Symptoms Reported: No symptoms reported    Integumentary Integumentary Symptoms Reported: Wound Additional Integumentary Details: scar healed Skin Management Strategies: Medication therapy Skin Self-Management Outcome: 4 (good)  Musculoskeletal Musculoskelatal Symptoms Reviewed: Joint pain, Limited mobility, Unsteady gait Other Musculoskeletal Symptoms: post surgical Musculoskeletal Management Strategies: Medical device, Medication therapy, Routine screening      Psychosocial Psychosocial Symptoms Reported: Other (Ongoing dealing with father's death and sister not mentioning anything about their father during Thanksgiving dinner.) Behavioral Management Strategies: Counseling, Coping strategies (Encouraged to reach out to Hospice team who offers support to family members of those in bereavement period, patient states he has the papers that was sent to him from the hospice team.) Behavioral Health Self-Management Outcome: 3 (uncertain)       Today's Vitals   05/09/24 1303  Weight: 263 lb 1.9 oz (119.4 kg)   Pain Scale: 0-10 Pain Score: 7  Pain Type: Surgical pain, Chronic pain Pain Location: Ankle Pain Orientation: Right (Patient states some ongoing numbness in right thigh ongoing post op and has mentioned to ortho surgeon group, encouraged to speak with the ankle specialist as well regarding expectations and outcomes of healing/care.) Pain Descriptors / Indicators: Aching, Jabbing Pain Onset: On-going  Medications Reviewed Today     Reviewed by Eilleen Richerd GRADE, RN (Registered Nurse) on 05/09/24 at 1312  Med  List Status: <  None>   Medication Order Taking? Sig Documenting Provider Last Dose Status Informant  acetaminophen  (TYLENOL ) 325 MG tablet [id number] Yes Take 325-650 mg by mouth every 6 (six) hours as needed for mild pain (pain score 1-3) (or headaches). [provider]  Active Self  albuterol  (VENTOLIN  HFA) 108 (90 Base) MCG/ACT inhaler [id number] Yes Inhale 1-2 puffs into the lungs every 6 (six) hours as needed for wheezing or shortness of breath. Simon Lavonia SAILOR, MD  Active Self  ARTIFICIAL TEARS 1 % ophthalmic solution [id number] Yes Place 1 drop into both eyes 3 (three) times daily as needed (for dryness). [provider]  Active Self  b complex vitamins capsule [id number] Yes Take 1 capsule by mouth daily with lunch. [provider]  Active Self  cefadroxil  (DURICEF) 500 MG capsule [id number] Yes Take 2 capsules (1,000 mg total) by mouth 2 (two) times daily. [name], MD  Active   Cholecalciferol (VITAMIN D -3 PO) [id number] Yes Take 1 capsule by mouth daily with lunch. [provider]  Active Self           Med Note [name], [name]   Tue Apr 05, 2024  6:29 PM) Strength not recalled  clobetasol  cream (TEMOVATE ) 0.05 % [id number]  Apply topically 2 (two) times daily. Apply to Psoriatic plaques on the extremities, trunk or scalp. DO not apply to face, intertriginous areas (armpits, groin, under pannus, gluteal cleft), or genitals.  Patient not taking: Reported on 04/28/2024   Tobie Yetta HERO, MD  Active Self           Med Note [name], [name]   Tue Apr 05, 2024  4:14 PM)    Cyanocobalamin  (VITAMIN B12 PO) [id number] Yes Take 1 tablet by mouth daily with lunch. [provider]  Active Self           Med Note [name], [name]   Tue Apr 05, 2024  6:29 PM) Strength not recalled  folic acid  (FOLVITE ) 1 MG tablet [id number] Yes Take 1 tablet (1 mg total) by mouth daily with lunch. Jeannetta Lonni ORN, MD  Active   furosemide  (LASIX ) 80 MG tablet [id number] Yes  Take 1 tablet (80 mg total) by mouth daily. McElwee, Lauren A, NP  Active   lactulose  (CHRONULAC ) 10 GM/15ML solution [id number]  Take 30 mLs (20 g total) by mouth 3 (three) times daily.  Patient not taking: Reported on 04/28/2024   Jillian Buttery, MD  Active   methocarbamol  (ROBAXIN ) 750 MG tablet [id number] Yes Take 1 tablet (750 mg total) by mouth every 6 (six) hours as needed for muscle spasms. Berneta Elsie Sayre, MD  Active   nadolol  (CORGARD ) 20 MG tablet [id number] Yes Take 1 tablet (20 mg total) by mouth daily. Jillian Buttery, MD  Active   pantoprazole  (PROTONIX ) 40 MG tablet [id number] Yes Take 1 tablet (40 mg total) by mouth 2 (two) times daily. Berneta Elsie Sayre, MD  Active   Secukinumab  (COSENTYX  UNOREADY) 300 MG/2ML EMMANUEL [id number]  Inject 300mg  into the skin at [facility] 0, 1, 2, 3  Patient not taking: Reported on 05/09/2024   Jeannetta Lonni ORN, MD  Active Self           Med Note [name], [name]   Tue Apr 05, 2024  4:15 PM)    spironolactone  (ALDACTONE ) 100 MG tablet [id number] Yes Take 1 tablet (100 mg total) by mouth daily. Jillian Buttery, MD  Active   Zinc 50 MG TABS [id number] Yes Take 50 mg by  mouth daily with lunch. [provider]  Active Self            Recommendation:   Referral to: RN CCM program  Follow Up Plan:   Referral to RN Case Manager Closing From:  Transitions of Care Program Patient has met all care management goals. Care Management case will be closed. Patient has been provided contact information should new needs arise.   Richerd Fish, RN, BSN, CCM [facility], [facility] Management Coordinator Direct Dial: [phone]

## 2024-05-09 NOTE — Patient Instructions (Signed)
 Visit Information  Thank you for taking time to visit with me today. Please don't hesitate to contact me if I can be of assistance to you before our next scheduled telephone appointment.  Our next appointment is being referred to [facility] RN.  Following is a copy of your care plan:   Goals Addressed             This Visit's Progress    VBCI Transitions of Care (TOC) Care Plan       Problems: (reactivated 04/13/24 due to hospitalized) Reviewed 04/27/24 Recent Hospitalization for treatment of Pulmonary Disease and Cellulitis right ankle infection with removal of hardware - osteomyelitis right ankle home IV therapy antibiotics - Outpatient Parenteral Antibiotic Therapy Information Antibiotic: Cefazolin  (Ancef ) IVPB; Indications for use: Right Ankle Osteomyelitis; End Date: 05/02/2024 - [facility] (04/13/24 - ongoing IV antibiotic) Hospital Bed - [employer], delivered Knowledge Deficit Related to post hospital care and s/s of infections, activity level, post hospital follow up. Explained to patient regarding recommendation for aspirin  81 milligram twice daily for 4 weeks for DVT prophylaxis.  04/13/24 Reviewed and ongoing 04/20/24 Reviewed with patient medications and patient is taking Lactulose  but only once a day. Reviewed reason for Lactulose  and patient is well versed  in medications. Ongoing encouragement to medication regimen. Patient awaiting follow up with PCP office regarding results and next steps with CXR. Patient states he started diuresing a lot yesterday, no more swelling and breathing is easier. 04/27/24 Patient reviewed about the Lactulose  and states not taking because he feels he has clarity and it's not needed, feels his amonma level are good 04/20/24 - 04/27/24 reviewed - Spoke with patient about possibly getting a urinal since he states he is really going to the bathroom a lot and has to get to his wheelchair quickly to make it to the bathroom.  Verbalized understanding and states that would  not be a problem to get even maybe delivered from [place]. 05/09/24 reviewed progress and goals, discussed pain medication regimen as patient has been prescribed Hydrocodone-Acetaminophen . Discussed Acetaminophen  and patient states he is taking any pain medications sparingly. Discussed with increased activity and patient states due to diagnosis now of bone-on-bone with ankle. Awaiting a call from referral of Jane GLENWOOD Chancy with an ankle specialist. Patient feels he needs a motorized wheelchair. Reviewed that the ankle specialist along with HHPT recommendations could assist in the process if they assess this need. Patient verbalized understanding. Patient now on oral antibiotics.  Goal:  Over the next 30 days, the patient will not experience hospital readmission  Interventions:  Transitions of Care: Doctor Visits  - discussed the importance of doctor visits  Patient Self Care Activities:  Attend all scheduled provider appointments Call pharmacy for medication refills 3-7 days in advance of running out of medications Call provider office for new concerns or questions  Notify RN Care Manager of TOC call rescheduling needs Participate in Transition of Care Program/Attend TOC scheduled calls Take medications as prescribed   Patient encouraged to pick up or have Aspirin  delivered Ongoing pain - post op reviewed, patient desires to not take Oxycodone  unless absolutely necessary, he states. Medications encouraged as prescribed. Reviewed measures to decrease pain such as resting limb, working with HHPT/OT on needs to maintain safety and best activity for his ankle surgery.   04/20/24 Patient desires HHOT he believes would be more beneficial at this time. Awaiting a call from HHPT for visit. Encouraged patient to speak with HHPT with referral and explained that PCP would  have to give orders as well to get coverage. Patient verbalized understanding and will follow up. 04/27/24 Has appointment tomorrow with  Infectious Disease follow up states IVPB antibiotic due to finished on 05/02/24. 05/09/24 Follow up with ankle specialist referred from [facility].  Plan:  Telephone follow up appointment with care management team member scheduled for:  ongoing follow up in 30 day TOC  program The care management team will reach out to the patient again over the next 7-10 business days. The patient has been provided with contact information for the care management team and has been advised to call with any health related questions or concerns.  The patient will call infectious disease provider for  as advised to symptoms of infection with PICC line or ankle, with swelling, increased pain, site with redness/pain.  Patient to get appointment with Orthopedic surgeon, patient plans to call his insurance company regarding his benefit for rides to appointments.  Patient desires medications to be delivery from [facility] however he has attempted to have [facility] to assist without success. Will reach out to [facility] and sent via secure chat to Channing Mealing, Pharm for assistance/help. 04/20/24 Patient has been outreached and working with Theatre Manager for transition to [facility] for med delivery and was adult nurse, he states he needs to [employer] and plans to do so as well. 04/20/24 Follow up scheduled for next week, patient uses MyChart portal for information and appointments, confirmed. Discussed and offered 30 day TOC program.  Patient agrees to 30 day weekly calls.  The patient has been provided with contact information for the care management team and has been advised to call with any health -related questions or concerns.  The patient verbalized understanding with current plan of care.  The patient is directed to their insurance card regarding availability of benefits coverage.   04/27/24 Continue care plan - encouraged to seek out [facility] in which his father was in for  support. State he has the papers but hasn't dove into it yet. Reminded of their availability to support and counsel. Patient expressed understanding. 04/27/24 Patient to call for transportation to appointment for tomorrow 04/28/24 describes no issues with medical transportation. 05/09/24 Current 30 day goals met. Reviewed follow up appointments and for ongoing follow up with RN CCM for complex disease management needs. Patient agrees to ongoing support for complex disease management.        Patient verbalizes understanding of instructions and care plan provided today and agrees to view in MyChart. Active MyChart status and patient understanding of how to access instructions and care plan via MyChart confirmed with patient.     The patient has been provided with contact information for the care management team and has been advised to call with any health related questions or concerns.  The care management team will reach out to the patient again over the next 10-28 business days.   Please call the care guide team at [phone] if you need to cancel or reschedule your appointment.   Please call the Suicide and Crisis Lifeline: 988 call the [place]  National Suicide Prevention Lifeline: 564-476-3856 or TTY: (814)245-9718 TTY 317-848-3189) to talk to a trained counselor call 1-800-273-TALK (toll free, 24 hour hotline) go to [facility] [address], [place] [phone]) call 911 if you are experiencing a Mental Health or Behavioral Health Crisis or need someone to talk to.  Richerd Fish, RN, BSN, CCM [place]  [facility], [employer]  Health Care Management Coordinator Direct Dial: [phone]

## 2024-05-10 DIAGNOSIS — R6889 Other general symptoms and signs: Secondary | ICD-10-CM | POA: Diagnosis not present

## 2024-05-11 DIAGNOSIS — R6889 Other general symptoms and signs: Secondary | ICD-10-CM | POA: Diagnosis not present

## 2024-05-13 DIAGNOSIS — R6889 Other general symptoms and signs: Secondary | ICD-10-CM | POA: Diagnosis not present

## 2024-05-16 DIAGNOSIS — R6889 Other general symptoms and signs: Secondary | ICD-10-CM | POA: Diagnosis not present

## 2024-05-18 ENCOUNTER — Telehealth: Payer: Self-pay | Admitting: Family Medicine

## 2024-05-18 DIAGNOSIS — R6889 Other general symptoms and signs: Secondary | ICD-10-CM | POA: Diagnosis not present

## 2024-05-18 NOTE — Telephone Encounter (Signed)
 Copied from CRM #[id number]. Topic: Clinical - Home Health Verbal Orders >> May 18, 2024  4:32 PM Roselie BROCKS wrote: Caller/Agency: [facility]  Callback Number: [phone] Service Requested: Physical Therapy Frequency: patient can not walk more then 50 feet ,and requests a power chair, patient was discharged 05-17-24  Any new concerns about the patient? No

## 2024-05-19 NOTE — Telephone Encounter (Signed)
Please review and advise. Thanks. Dm/cma  

## 2024-05-20 DIAGNOSIS — R6889 Other general symptoms and signs: Secondary | ICD-10-CM | POA: Diagnosis not present

## 2024-05-20 NOTE — Telephone Encounter (Signed)
 Called and left a detailed VM giving okay for PT. Dm/cma

## 2024-05-23 DIAGNOSIS — R6889 Other general symptoms and signs: Secondary | ICD-10-CM | POA: Diagnosis not present

## 2024-05-24 ENCOUNTER — Other Ambulatory Visit: Payer: Self-pay

## 2024-05-24 ENCOUNTER — Encounter: Payer: Self-pay | Admitting: Family Medicine

## 2024-05-24 DIAGNOSIS — M25571 Pain in right ankle and joints of right foot: Secondary | ICD-10-CM

## 2024-05-24 DIAGNOSIS — R6889 Other general symptoms and signs: Secondary | ICD-10-CM | POA: Diagnosis not present

## 2024-05-24 NOTE — Patient Instructions (Signed)
 Visit Information  [name] was given information about Medicaid Managed Care team care coordination services as a part of their [employer] Medicaid benefit. Thomas [name]   If you would like to schedule transportation through your [facility] plan, please call the following number at least 2 days in advance of your appointment: [phone]  For information about your ride after you set it up, call Ride Assist at [phone]. Use this number to activate a Will Call pickup, or if your transportation is late for a scheduled pickup. Use this number, too, if you need to make a change or cancel a previously scheduled reservation.  If you need transportation services right away, call [phone]. The after-hours call center is staffed 24 hours to handle ride assistance and urgent reservation requests (including discharges) 365 days a year. Urgent trips include sick visits, hospital discharge requests and life-sustaining treatment.  Call the [facility] Line at [phone], at any time, 24 hours a day, 7 days a week. If you are in danger or need immediate medical attention call 911.   Care plan and visit instructions communicated with the patient verbally today. Patient agrees to receive a copy in MyChart. Active MyChart status and patient understanding of how to access instructions and care plan via MyChart confirmed with patient.     Telephone follow up appointment with Managed Medicaid care management team member scheduled for: 06/07/24 at 2:30 PM  Rosaline Finlay, RN MSN [place]  VBCI Population Health RN Care Manager Direct Dial: [phone]  Fax: [phone]   Following is a copy of your plan of care:   Goals Addressed             This Visit's Progress    VBCI RN Care Plan   On track    Problems:  Chronic Disease Management support and education needs related to recent hospitalizations for recurrent right pleural effusion and right ankle  osteomyelitis  Goal: Over the next 30 days the Patient will demonstrate a decrease pleural effusion in exacerbations as evidenced by patient report of no symptoms of pleural effusion (shortness of breath, chest pain, cough) and continued compliance with prescribed medications  not experience hospital admission as evidenced by review of electronic medical record. Hospital Admissions in last 6 months = 3 work with joint specialist to determine plan of care s/p right ankle hardware removal as evidenced by review of electronic medical record and patient or care team member report   Report decreased pain as evidenced by patient report of pain rated <5/10  Interventions:   Evaluation of current treatment plan related to recurrent pleural effusion and osteomyelitis s/p hardware removal self-management and patient's adherence to plan as established by provider. Discussed plans with patient for ongoing care management follow up and provided patient with direct contact information for care management team Evaluation of current treatment plan related to osteomyelitis s/p hardware removal and patient's adherence to plan as established by provider Reviewed medications with patient and discussed importance of medication compliance, especially regarding diuretics to prevent pleural effusion Reviewed scheduled/upcoming provider appointments including GI 05/31/24 Screening for signs and symptoms of depression related to chronic disease state  Assessed social determinant of health barriers Ensured patient has follow-up scheduled with orthopedic provider to following right ankle hardware removal  Patient Self-Care Activities:  Attend all scheduled provider appointments Call provider office for new concerns or questions  Perform all self care activities independently  Take medications as prescribed    Plan:  Telephone  follow up appointment with care management team member scheduled for:  06/07/24 at 2:30  PM

## 2024-05-24 NOTE — Patient Outreach (Signed)
 Complex Care Management   Visit Note  05/24/2024  Name:  Thomas [name] MRN: [id number] DOB: 1976/03/09  Situation: Referral received for Complex Care Management related to recent hospitalizations related to recurrent pleural effusion and osteomyelitis s/p removal of right ankle hardware I obtained verbal consent from Patient.  Visit completed with Patient  on the phone  Background:   Past Medical History:  Diagnosis Date   Acute conjunctivitis of both eyes 10/22/2021   Acute sinusitis 06/16/2013   Alcoholic cirrhosis (HCC)    Alcoholic fatty liver 01/16/2010   Needs final HBV and HAV vaccines on or after 10/25/2012    Alcoholism (HCC) 12/25/2011   Allergic rhinitis    Childhood asthma    Elevated transaminase level 06/10/2007   AST: 80 ALT: 136 in 8/11: Hepatitis A., B and C negative.    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    Sleep apnea     Assessment: Patient Reported Symptoms:  Cognitive Cognitive Status: Able to follow simple commands, Alert and oriented to person, place, and time, Normal speech and language skills Cognitive/Intellectual Conditions Management [RPT]: None reported or documented in medical history or problem list   Health Maintenance Behaviors: Annual physical exam, Stress management Health Facilitated by: Healthy diet, Pain control, Rest, Stress management  Neurological Neurological Review of Symptoms: Numbness Neurological Management Strategies: Adequate rest, Coping strategies, Medical device, Routine screening Neurological Comment: Numbness R thigh is off and on but more off than on per patient  HEENT HEENT Symptoms Reported: No symptoms reported      Cardiovascular Cardiovascular Symptoms Reported: No symptoms reported Does patient have uncontrolled  Hypertension?: No Cardiovascular Management Strategies: Medication therapy, Routine screening  Respiratory Respiratory Symptoms Reported: No symptoms reported Additional Respiratory Details: Patient reports he is no longer having shortness of breath Respiratory Management Strategies: Routine screening  Endocrine Endocrine Symptoms Reported: No symptoms reported Is patient diabetic?: No    Gastrointestinal Gastrointestinal Symptoms Reported: No symptoms reported Additional Gastrointestinal Details: Patient reports he continues not to take lactulose . He reports his last BM was yesterday. Patient reports a good appetite Gastrointestinal Management Strategies: Coping strategies, Diet modification Gastrointestinal Comment: Reminded patient of upcoming appointment with GI 05/31/24    Genitourinary Genitourinary Symptoms Reported: Urgency, Frequency Additional Genitourinary Details: Frequency and urgency due to diuretics Genitourinary Management Strategies: Coping strategies  Integumentary Integumentary Symptoms Reported: Other Other Integumentary Symptoms: Psoriasis. patient reports he sees rheumatology 07/13/24 Additional Integumentary Details: Patient reports scab on R ankle from surgical incision Skin Management Strategies: Routine screening  Musculoskeletal Musculoskelatal Symptoms Reviewed: Joint pain, Unsteady gait Additional Musculoskeletal Details: Patient reports increased strength in his legs, but his right ankle continues to give him issues related to pain. He states it pops and clicks because basically I'm grinding the two bones together. Patient reports pain is worse with movement. Patient reports he is scheduled with a joint specialist this coming Friday 05/27/24 Musculoskeletal Management Strategies: Medical device, Medication therapy, Routine screening, Adequate rest Frieda) Musculoskeletal Comment: Patient reports HH PT signed off about a week ago. Falls in the past year?:  No Number of falls in past year: 1 or less Was there an injury with Fall?: No Fall Risk Category Calculator: 0 Patient Fall Risk Level: Low Fall Risk Patient at Risk for Falls Due to: Orthopedic patient,  Impaired balance/gait Fall risk Follow up: Falls evaluation completed, Education provided, Falls prevention discussed  Psychosocial Psychosocial Symptoms Reported: Sadness - if selected complete PHQ 2-9 Additional Psychological Details: Patient reports strained relationship with his sister since his father's death in 04-Nov-2025Behavioral Management Strategies: Coping strategies, Abstinence from substances Major Change/Loss/Stressor/Fears (CP): Death of a loved one, Relationship concerns, Medical condition, self (Recent death of his father) Techniques to [employer] with Loss/Stress/Change: Diversional activities Quality of Family Relationships: non-existent Do you feel physically threatened by others?: No    05/24/2024    PHQ2-9 Depression Screening   Little interest or pleasure in doing things Not at all  Feeling down, depressed, or hopeless Several days  PHQ-2 - Total Score 1  Trouble falling or staying asleep, or sleeping too much    Feeling tired or having little energy    Poor appetite or overeating     Feeling bad about yourself - or that you are a failure or have let yourself or your family down    Trouble concentrating on things, such as reading the newspaper or watching television    Moving or speaking so slowly that other people could have noticed.  Or the opposite - being so fidgety or restless that you have been moving around a lot more than usual    Thoughts that you would be better off dead, or hurting yourself in some way    PHQ2-9 Total Score    If you checked off any problems, how difficult have these problems made it for you to do your work, take care of things at home, or get along with other people    Depression Interventions/Treatment      There were no vitals filed  for this visit. Pain Scale: 0-10 Pain Score: 6  Pain Type: Chronic pain, Surgical pain Pain Location: Ankle Pain Orientation: Right  Medications Reviewed Today     Reviewed by Arno Rosaline SQUIBB, RN (Registered Nurse) on 05/24/24 at 1414  Med List Status: <None>   Medication Order Taking? Sig Documenting Provider Last Dose Status Informant  acetaminophen  (TYLENOL ) 325 MG tablet [id number] Yes Take 325-650 mg by mouth every 6 (six) hours as needed for mild pain (pain score 1-3) (or headaches). [provider]  Active Self  albuterol  (VENTOLIN  HFA) 108 (90 Base) MCG/ACT inhaler [id number] Yes Inhale 1-2 puffs into the lungs every 6 (six) hours as needed for wheezing or shortness of breath. Simon Lavonia SAILOR, MD  Active Self  ARTIFICIAL TEARS 1 % ophthalmic solution [id number] Yes Place 1 drop into both eyes 3 (three) times daily as needed (for dryness). [provider]  Active Self  b complex vitamins capsule [id number] Yes Take 1 capsule by mouth daily with lunch. [provider]  Active Self  cefadroxil  (DURICEF) 500 MG capsule [id number] Yes Take 2 capsules (1,000 mg total) by mouth 2 (two) times daily. [name], MD  Active   Cholecalciferol (VITAMIN D -3 PO) [id number] Yes Take 1 capsule by mouth daily with lunch. [provider]  Active Self           Med Note [name], [name]   Tue Apr 05, 2024  6:29 PM) Strength not recalled  clobetasol  cream (TEMOVATE ) 0.05 % [id number] Yes Apply topically 2 (two) times daily. Apply to Psoriatic plaques on the extremities, trunk or scalp. DO not apply to face, intertriginous areas (armpits, groin, under pannus, gluteal cleft), or genitals. Tobie Yetta HERO, MD  Active Self  Med Note [name], [name]   Tue Apr 05, 2024  4:14 PM)    Cyanocobalamin  (VITAMIN B12 PO) [id number] Yes Take 1 tablet by mouth daily with lunch. [provider]  Active Self           Med Note [name], [name]   Tue Apr 05, 2024  6:29 PM)  Strength not recalled  folic acid  (FOLVITE ) 1 MG tablet [id number] Yes Take 1 tablet (1 mg total) by mouth daily with lunch. Jeannetta Lonni ORN, MD  Active   furosemide  (LASIX ) 80 MG tablet [id number] Yes Take 1 tablet (80 mg total) by mouth daily. McElwee, Lauren A, NP  Active   HYDROcodone-acetaminophen  (NORCO/VICODIN) 5-325 MG tablet [id number]  Take 1 tablet by mouth every 6 (six) hours as needed for moderate pain (pain score 4-6).  Patient not taking: Reported on 05/24/2024   [provider]  Active            Med Note LESLY RICHERD CINDERELLA Pablo May 09, 2024  1:19 PM) Given by ortho for pain on 05/04/24  lactulose  (CHRONULAC ) 10 GM/15ML solution [id number]  Take 30 mLs (20 g total) by mouth 3 (three) times daily.  Patient not taking: Reported on 05/24/2024   Jillian Buttery, MD  Active   methocarbamol  (ROBAXIN ) 750 MG tablet [id number] Yes Take 1 tablet (750 mg total) by mouth every 6 (six) hours as needed for muscle spasms. Berneta Elsie Sayre, MD  Active   nadolol  (CORGARD ) 20 MG tablet [id number] Yes Take 1 tablet (20 mg total) by mouth daily. Jillian Buttery, MD  Active   pantoprazole  (PROTONIX ) 40 MG tablet [id number] Yes Take 1 tablet (40 mg total) by mouth 2 (two) times daily. Berneta Elsie Sayre, MD  Active   Secukinumab  (COSENTYX  UNOREADY) 300 MG/2ML EMMANUEL [id number]  Inject 300mg  into the skin at [facility] 0, 1, 2, 3  Patient not taking: Reported on 05/24/2024   Jeannetta Lonni ORN, MD  Active Self           Med Note [name], [name]   Tue Apr 05, 2024  4:15 PM)    spironolactone  (ALDACTONE ) 100 MG tablet [id number] Yes Take 1 tablet (100 mg total) by mouth daily. Jillian Buttery, MD  Active   Zinc 50 MG TABS [id number] Yes Take 50 mg by mouth daily with lunch. [provider]  Active Self            Recommendation:   Continue Current Plan of Care  Follow Up Plan:   Telephone follow up appointment date/time:  06/07/24 at 2:30 PM  Rosaline Finlay, RN MSN [facility]  [facility] RN Care Manager Direct Dial: [phone]  Fax: [phone]

## 2024-05-25 ENCOUNTER — Other Ambulatory Visit: Payer: Self-pay | Admitting: Nurse Practitioner

## 2024-05-25 DIAGNOSIS — K7030 Alcoholic cirrhosis of liver without ascites: Secondary | ICD-10-CM

## 2024-05-25 DIAGNOSIS — R7989 Other specified abnormal findings of blood chemistry: Secondary | ICD-10-CM

## 2024-05-25 DIAGNOSIS — R6889 Other general symptoms and signs: Secondary | ICD-10-CM | POA: Diagnosis not present

## 2024-05-25 DIAGNOSIS — F1090 Alcohol use, unspecified, uncomplicated: Secondary | ICD-10-CM

## 2024-05-25 DIAGNOSIS — I8510 Secondary esophageal varices without bleeding: Secondary | ICD-10-CM

## 2024-05-25 NOTE — Telephone Encounter (Signed)
 May I get a referral for Dr Atlee Sharps at [facility] at [facility] for my ankle cartilage issues    Please review and advise.  Thanks. Dm/cma

## 2024-05-26 ENCOUNTER — Telehealth: Payer: Self-pay

## 2024-05-26 ENCOUNTER — Other Ambulatory Visit: Payer: Self-pay

## 2024-05-26 ENCOUNTER — Ambulatory Visit: Admitting: Internal Medicine

## 2024-05-26 VITALS — BP 109/72 | HR 66 | Temp 98.3°F | Resp 16

## 2024-05-26 DIAGNOSIS — A4901 Methicillin susceptible Staphylococcus aureus infection, unspecified site: Secondary | ICD-10-CM

## 2024-05-26 DIAGNOSIS — M86079 Acute hematogenous osteomyelitis, unspecified ankle and foot: Secondary | ICD-10-CM

## 2024-05-26 DIAGNOSIS — B9561 Methicillin susceptible Staphylococcus aureus infection as the cause of diseases classified elsewhere: Secondary | ICD-10-CM | POA: Diagnosis not present

## 2024-05-26 DIAGNOSIS — M868X7 Other osteomyelitis, ankle and foot: Secondary | ICD-10-CM | POA: Diagnosis not present

## 2024-05-26 NOTE — Progress Notes (Signed)
 "      Patient ID: Thomas [name], male   DOB: 1975-09-29, 48 y.o.   MRN: [id number]  HPI MSSA ankle HW osteo = will finishes iv abtx 11/24, pull picc line. Start oral abtx on 11/15 x 4 wks -- has some upset stomach with abtx.. but now he staggered the meds which has helped.  Since we last saw him. No spironolactone , decreased lasix  to 40mg  , off of nadolol , switched to carvedilol  His ankle has healed. He saw dr maynard 2 wks ago. Healing. Small scab that hasn't fallen off. No cartilidge. Now has bone on bone grinding. ---> getting to see another ortho specialist. Dr Barton.  Outpatient Encounter Medications as of 05/26/2024  Medication Sig   acetaminophen  (TYLENOL ) 325 MG tablet Take 325-650 mg by mouth every 6 (six) hours as needed for mild pain (pain score 1-3) (or headaches).   albuterol  (VENTOLIN  HFA) 108 (90 Base) MCG/ACT inhaler Inhale 1-2 puffs into the lungs every 6 (six) hours as needed for wheezing or shortness of breath.   ARTIFICIAL TEARS 1 % ophthalmic solution Place 1 drop into both eyes 3 (three) times daily as needed (for dryness).   b complex vitamins capsule Take 1 capsule by mouth daily with lunch.   cefadroxil  (DURICEF) 500 MG capsule Take 2 capsules (1,000 mg total) by mouth 2 (two) times daily.   Cholecalciferol (VITAMIN D -3 PO) Take 1 capsule by mouth daily with lunch.   clobetasol  cream (TEMOVATE ) 0.05 % Apply topically 2 (two) times daily. Apply to Psoriatic plaques on the extremities, trunk or scalp. DO not apply to face, intertriginous areas (armpits, groin, under pannus, gluteal cleft), or genitals.   Cyanocobalamin  (VITAMIN B12 PO) Take 1 tablet by mouth daily with lunch.   folic acid  (FOLVITE ) 1 MG tablet Take 1 tablet (1 mg total) by mouth daily with lunch.   furosemide  (LASIX ) 80 MG tablet Take 1 tablet (80 mg total) by mouth daily.   methocarbamol  (ROBAXIN ) 750 MG tablet Take 1 tablet (750 mg total) by mouth every 6 (six) hours as needed for muscle spasms.    nadolol  (CORGARD ) 20 MG tablet Take 1 tablet (20 mg total) by mouth daily.   pantoprazole  (PROTONIX ) 40 MG tablet Take 1 tablet (40 mg total) by mouth 2 (two) times daily.   Zinc 50 MG TABS Take 50 mg by mouth daily with lunch.   carvedilol (COREG) 3.125 MG tablet Take 3.125 mg by mouth. (Patient not taking: Reported on 05/26/2024)   eplerenone (INSPRA) 50 MG tablet Take 50 mg by mouth daily. (Patient not taking: Reported on 05/26/2024)   HYDROcodone-acetaminophen  (NORCO/VICODIN) 5-325 MG tablet Take 1 tablet by mouth every 6 (six) hours as needed for moderate pain (pain score 4-6). (Patient not taking: Reported on 05/26/2024)   lactulose  (CHRONULAC ) 10 GM/15ML solution Take 30 mLs (20 g total) by mouth 3 (three) times daily. (Patient not taking: Reported on 05/26/2024)   Secukinumab  (COSENTYX  UNOREADY) 300 MG/2ML SOAJ Inject 300mg  into the skin at Weeks 0, 1, 2, 3 (Patient not taking: Reported on 05/26/2024)   spironolactone  (ALDACTONE ) 100 MG tablet Take 1 tablet (100 mg total) by mouth daily. (Patient not taking: Reported on 05/26/2024)   No facility-administered encounter medications on file as of 05/26/2024.     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   Wound infection complicating hardware, initial encounter 03/20/2024   High risk medication use 02/29/2024   Pleural effusion associated with hepatic disorder 02/20/2024   Other ascites 06/18/2023   Cellulitis of abdominal wall 03/10/2023   Esophageal varices without bleeding (HCC) 02/13/2023   History of ETOH abuse 01/09/2023   Ankle fracture 01/07/2023   Localized edema 01/07/2023   Closed right ankle fracture 01/06/2023   Thrombocytopenia 01/06/2023   Cirrhosis of liver (HCC) 12/25/2022   Secondary esophageal varices  with bleeding (HCC) 12/25/2022   Reactive airway disease 07/28/2022   Acute upper GI bleed 07/05/2022   Hematemesis with nausea 07/05/2022   Melena 07/05/2022   Pharyngitis 10/22/2021   Psoriatic arthritis (HCC) 10/03/2021   Viral upper respiratory tract infection 10/03/2021   Acute blood loss anemia 08/26/2021   Screening for tuberculosis 08/15/2021   Elevated BP without diagnosis of hypertension 06/14/2021   Need for immunization against influenza 06/14/2021   Alcoholic cirrhosis of liver (HCC) 09/26/2019   Class 3 severe obesity due to excess calories with body mass index (BMI) of 50.0 to 59.9 in adult [facility]) 09/26/2019   Healthcare maintenance 02/07/2019   Psoriasis 02/07/2019   Vitamin D  deficiency 05/07/2016   OSA (obstructive sleep apnea) 06/23/2013   Insomnia 06/16/2013   Alcohol  use disorder 12/25/2011   Scrotal varices, 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   Alcoholic fatty liver 01/16/2010   Allergic rhinitis 10/12/2007     There are no preventive care reminders to display for this patient.   Review of Systems 12 point ros is otherwise negative Physical Exam   BP 109/72   Pulse 66   Temp 98.3 F (36.8 C) (Oral)   Resp 16   SpO2 96%    Physical Exam  Constitutional: He is oriented to person, place, and time. He appears well-developed and well-nourished. No distress.  HENT:  Mouth/Throat: Oropharynx is clear and moist. No oropharyngeal exudate.  Cardiovascular: Normal rate, regular rhythm and normal heart sounds. Exam reveals no gallop and no friction rub.  No murmur heard.  Pulmonary/Chest: Effort normal and breath sounds normal. No respiratory distress. He has no wheezes.  Abdominal: Soft. Bowel sounds are normal. He exhibits no distension. There is no tenderness.  Lymphadenopathy:  He has no cervical adenopathy.  Neurological: He is alert and oriented to person, place, and time.  Skin: Skin is warm and dry. No rash noted. No erythema.  Psychiatric: He has a normal mood and  affect. His behavior is normal.    CBC Lab Results  Component Value Date   WBC 6.1 04/15/2024   RBC 3.38 (L) 04/15/2024   HGB 11.2 (L) 04/15/2024   HCT 33.3 (L) 04/15/2024   PLT 150.0 04/15/2024   MCV 98.5 04/15/2024   MCH 32.9 04/07/2024   MCHC 33.6 04/15/2024   RDW 15.3 04/15/2024   LYMPHSABS 1.1 04/15/2024   MONOABS 0.8 04/15/2024   EOSABS 0.4 04/15/2024    BMET Lab Results  Component Value Date   NA 137 04/15/2024   K 3.9 04/15/2024   CL 98 04/15/2024   CO2 32 04/15/2024   GLUCOSE 79 04/15/2024   BUN 21 04/15/2024   CREATININE 0.77 04/15/2024   CALCIUM 8.2 (L) 04/15/2024   GFRNONAA >60 04/12/2024   GFRAA >89 06/22/2013    Lab Results  Component Value Date   ESRSEDRATE 39 (H) 03/20/2024   Lab Results  Component Value Date   CRP 4.6 (H) 03/20/2024   Lab Results  Component Value  Date   ESRSEDRATE 77 (H) 05/26/2024   Lab Results  Component Value Date   CRP 23.5 (H) 05/26/2024    Assessment and Plan  MSSA ankle osteomyelitis s/p HW removal = Will check sed rate and crp Continue on abtx for now if inflammatory markers are normal then we will stop and observe off of abtx  Based on labs from visit, we will plan to refill abtx for another 4 wk. "

## 2024-05-26 NOTE — Patient Outreach (Signed)
 Received call from patient stating [employer] has notified him that his hospital bed is not in compliance the doctor or somebody didn't fill out the form. [employer] has notified him that they need to pick bed back up.  Call placed to [facility] for more information. Representative does see a note and advises to contact Patient Account Department at [phone] ext [phone] for more information. Spoke with Mallie at Patient Account Department. She reports pending pickup has been removed. The noncompliance on account is due to non-pay, but they have not been billing insurance since April 2025. She will email reach out to location to request billing restarted through his insurance. Provided Mallie with RN direct contact number and asked to call with an update when available.  Called patient to update on conversation with [employer]. Patient verbalizes understanding.  Rosaline Finlay, RN MSN [place]  VBCI Population Health RN Care Manager Direct Dial: [phone]  Fax: [phone]

## 2024-05-27 ENCOUNTER — Encounter: Payer: Self-pay | Admitting: Internal Medicine

## 2024-05-27 DIAGNOSIS — R6889 Other general symptoms and signs: Secondary | ICD-10-CM | POA: Diagnosis not present

## 2024-05-27 DIAGNOSIS — M19171 Post-traumatic osteoarthritis, right ankle and foot: Secondary | ICD-10-CM | POA: Diagnosis not present

## 2024-05-27 LAB — C-REACTIVE PROTEIN: CRP: 23.5 mg/L — ABNORMAL HIGH

## 2024-05-27 LAB — SEDIMENTATION RATE: Sed Rate: 53 mm/h — ABNORMAL HIGH (ref 0–15)

## 2024-05-27 MED ORDER — CEFADROXIL 500 MG PO CAPS: 1000.0000 mg | ORAL_CAPSULE | Freq: Two times a day (BID) | ORAL | 0 refills | Status: AC

## 2024-05-29 DIAGNOSIS — R6889 Other general symptoms and signs: Secondary | ICD-10-CM | POA: Diagnosis not present

## 2024-05-31 ENCOUNTER — Ambulatory Visit: Admitting: Gastroenterology

## 2024-05-31 NOTE — Telephone Encounter (Signed)
 Copied from CRM #[id number]. Topic: Clinical - Medical Advice >> May 31, 2024  2:44 PM Rea ORN wrote: Reason for CRM: Pt sent a message in Mychart requesting a referral for Dr Atlee Sharps on 05/24/24. He has not gotten a response yet.   Please call back [phone]

## 2024-05-31 NOTE — Addendum Note (Signed)
 Addended by: KYM KARNA CROME on: 05/31/2024 04:48 PM   Modules accepted: Orders

## 2024-06-07 ENCOUNTER — Other Ambulatory Visit: Payer: Self-pay

## 2024-06-07 NOTE — Patient Outreach (Signed)
 Complex Care Management   Visit Note  06/07/2024  Name:  Thomas [name] MRN: [id number] DOB: 1976/06/07  Situation: Referral received for Complex Care Management related to recent hospitalizations related to recurrent pleural effusion and osteomyelitis s/p removal of right ankle hardware I obtained verbal consent from Patient.  Visit completed with Patient  on the phone  Background:   Past Medical History:  Diagnosis Date   Acute conjunctivitis of both eyes 10/22/2021   Acute sinusitis 06/16/2013   Alcoholic cirrhosis (HCC)    Alcoholic fatty liver 01/16/2010   Needs final HBV and HAV vaccines on or after 10/25/2012    Alcoholism (HCC) 12/25/2011   Allergic rhinitis    Childhood asthma    Elevated transaminase level 06/10/2007   AST: 80 ALT: 136 in 8/11: Hepatitis A., B and C negative.    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    Sleep apnea     Assessment: Patient Reported Symptoms:  Cognitive Cognitive Status: Able to follow simple commands, Alert and oriented to person, place, and time, Normal speech and language skills Cognitive/Intellectual Conditions Management [RPT]: None reported or documented in medical history or problem list   Health Maintenance Behaviors: Annual physical exam, Stress management Health Facilitated by: Healthy diet, Pain control, Rest, Stress management  Neurological Neurological Review of Symptoms: Not assessed    HEENT HEENT Symptoms Reported: Not assessed      Cardiovascular Cardiovascular Symptoms Reported: No symptoms reported Does patient have uncontrolled Hypertension?: No Cardiovascular Management Strategies: Medication therapy, Routine screening  Respiratory Respiratory Symptoms Reported: No symptoms reported Respiratory Management Strategies:  Routine screening  Endocrine Endocrine Symptoms Reported: Not assessed    Gastrointestinal Gastrointestinal Symptoms Reported: No symptoms reported Additional Gastrointestinal Details: Patient reports he continues to have regular BM without the use of lactulose  Gastrointestinal Management Strategies: Coping strategies, Diet modification Gastrointestinal Comment: Patient confirms he has made medication changes as prescribed by liver specialist. Note per chart  review GI appointment was rescheduled to 07/05/24    Genitourinary Genitourinary Symptoms Reported: Not assessed    Integumentary Integumentary Symptoms Reported: Not assessed    Musculoskeletal Musculoskelatal Symptoms Reviewed: Joint pain, Unsteady gait Additional Musculoskeletal Details: Patient reports he was seen by joint specialist, and they didn't really get a good prognosis. Patient reports his options are to do nothing, get a bone fusion, or a 3D imprinted joint replacement. Patient states he does not want to do the bone fusion. Patient reports regardless of his decision, he is 6-12 months away from being able to get surgery due to previous complications and medications. Patient reports he has been referred to [facility], and he has not heard from them yet to schedule a visit. Patient reports he also has a referral to Dr Atlee Sharps with [facility] and is waiting to hear from office to schedule appointment Musculoskeletal Management Strategies: Adequate rest, Medical device, Medication therapy, Routine screening ([place]) Falls in the past year?: No Number of falls in past year: 1 or less Was there an injury with Fall?: No Fall Risk Category Calculator: 0 Patient Fall Risk Level: Low Fall Risk Patient at Risk for Falls Due to: Impaired balance/gait, Orthopedic patient Fall risk Follow up: Falls evaluation completed, Education provided, Falls prevention discussed  Psychosocial Psychosocial Symptoms  Reported: Sadness -  if selected complete PHQ 2-9 Additional Psychological Details: Patient reports he has tried to speak with a counselor in the past, but did not find it helpful Behavioral Management Strategies: Coping strategies, Abstinence from substances, Support system (Friends) Major Change/Loss/Stressor/Fears (CP): Death of a loved one, Medical condition, self, Relationship concerns Techniques to [employer] with Loss/Stress/Change: Diversional activities Quality of Family Relationships: non-existent Do you feel physically threatened by others?: No    06/07/2024    PHQ2-9 Depression Screening   Little interest or pleasure in doing things Not at all  Feeling down, depressed, or hopeless More than half the days  PHQ-2 - Total Score 2  Trouble falling or staying asleep, or sleeping too much Not at all  Feeling tired or having little energy Not at all  Poor appetite or overeating  Not at all  Feeling bad about yourself - or that you are a failure or have let yourself or your family down Not at all  Trouble concentrating on things, such as reading the newspaper or watching television Not at all  Moving or speaking so slowly that other people could have noticed.  Or the opposite - being so fidgety or restless that you have been moving around a lot more than usual Not at all  Thoughts that you would be better off dead, or hurting yourself in some way Not at all  PHQ2-9 Total Score 2  If you checked off any problems, how difficult have these problems made it for you to do your work, take care of things at home, or get along with other people    Depression Interventions/Treatment      There were no vitals filed for this visit. Pain Scale: 0-10 Pain Score: 2  Pain Type: Chronic pain, Surgical pain Pain Location: Ankle Pain Orientation: Right  Medications Reviewed Today     Reviewed by Arno Rosaline SQUIBB, RN (Registered Nurse) on 06/07/24 at 1450  Med List Status: <None>   Medication  Order Taking? Sig Documenting Provider Last Dose Status Informant  acetaminophen  (TYLENOL ) 325 MG tablet [id number]  Take 325-650 mg by mouth every 6 (six) hours as needed for mild pain (pain score 1-3) (or headaches). [provider]  Active Self  albuterol  (VENTOLIN  HFA) 108 (90 Base) MCG/ACT inhaler [id number]  Inhale 1-2 puffs into the lungs every 6 (six) hours as needed for wheezing or shortness of breath. Simon Lavonia SAILOR, MD  Active Self  ARTIFICIAL TEARS 1 % ophthalmic solution [id number]  Place 1 drop into both eyes 3 (three) times daily as needed (for dryness). [provider]  Active Self  b complex vitamins capsule [id number]  Take 1 capsule by mouth daily with lunch. [provider]  Active Self  carvedilol (COREG) 3.125 MG tablet [id number] Yes Take 3.125 mg by mouth. [provider]  Active   cefadroxil  (DURICEF) 500 MG capsule [id number]  Take 2 capsules (1,000 mg total) by mouth 2 (two) times daily. [name], MD  Active   Cholecalciferol (VITAMIN D -3 PO) [id number]  Take 1 capsule by mouth daily with lunch. [provider]  Active Self           Med Note [name], [name]   Tue Apr 05, 2024  6:29 PM) Strength not recalled  clobetasol  cream (TEMOVATE ) 0.05 % [id number]  Apply topically 2 (two) times daily. Apply to Psoriatic plaques on the extremities, trunk or scalp. DO not apply to face, intertriginous areas (armpits, groin, under pannus, gluteal cleft), or genitals. Patel, Pranav  M, MD  Active Self           Med Note [name], [name]   Tue Apr 05, 2024  4:14 PM)    Cyanocobalamin  (VITAMIN B12 PO) [id number]  Take 1 tablet by mouth daily with lunch. [provider]  Active Self           Med Note [name], [name]   Tue Apr 05, 2024  6:29 PM) Strength not recalled  eplerenone (INSPRA) 50 MG tablet [id number] Yes Take 50 mg by mouth daily. [provider]  Active   folic acid  (FOLVITE ) 1 MG tablet [id number]  Take 1 tablet (1 mg  total) by mouth daily with lunch. Jeannetta Lonni ORN, MD  Active   furosemide  (LASIX ) 80 MG tablet [id number]  Take 1 tablet (80 mg total) by mouth daily. McElwee, Lauren A, NP  Active   HYDROcodone-acetaminophen  (NORCO/VICODIN) 5-325 MG tablet [id number]  Take 1 tablet by mouth every 6 (six) hours as needed for moderate pain (pain score 4-6).  Patient not taking: Reported on 05/26/2024   [provider]  Active            Med Note LESLY RICHERD CINDERELLA Pablo May 09, 2024  1:19 PM) Given by ortho for pain on 05/04/24  lactulose  (CHRONULAC ) 10 GM/15ML solution [id number]  Take 30 mLs (20 g total) by mouth 3 (three) times daily.  Patient not taking: Reported on 05/26/2024   Jillian Buttery, MD  Active   methocarbamol  (ROBAXIN ) 750 MG tablet [id number]  Take 1 tablet (750 mg total) by mouth every 6 (six) hours as needed for muscle spasms. Berneta Elsie Sayre, MD  Active   nadolol  (CORGARD ) 20 MG tablet [id number]  Take 1 tablet (20 mg total) by mouth daily.  Patient not taking: Reported on 06/07/2024   Jillian Buttery, MD  Consider Medication Status and Discontinue (Discontinued by provider)   pantoprazole  (PROTONIX ) 40 MG tablet [id number]  Take 1 tablet (40 mg total) by mouth 2 (two) times daily. Berneta Elsie Sayre, MD  Active   Secukinumab  (COSENTYX  UNOREADY) 300 MG/2ML EMMANUEL [id number]  Inject 300mg  into the skin at [facility] 0, 1, 2, 3  Patient not taking: Reported on 05/26/2024   Jeannetta Lonni ORN, MD  Active Self           Med Note [name], [name]   Tue Apr 05, 2024  4:15 PM)    spironolactone  (ALDACTONE ) 100 MG tablet [id number]  Take 1 tablet (100 mg total) by mouth daily.  Patient not taking: Reported on 06/07/2024   Jillian Buttery, MD  Consider Medication Status and Discontinue (Discontinued by provider)   Zinc 50 MG TABS [id number]  Take 50 mg by mouth daily with lunch. [provider]  Active Self            Recommendation:   Continue Current Plan of  Care  Follow Up Plan:   Telephone follow up appointment date/time:  07/06/24 at 2:30 PM  Rosaline Finlay, RN MSN   [facility] Health RN Care Manager Direct Dial: [phone]  Fax: [phone]

## 2024-06-07 NOTE — Patient Instructions (Signed)
 Visit Information  [name] was given information about Medicaid Managed Care team care coordination services as a part of their [employer] Medicaid benefit. Thomas [name]   If you would like to schedule transportation through your [facility] plan, please call the following number at least 2 days in advance of your appointment: [phone]  For information about your ride after you set it up, call Ride Assist at [phone]. Use this number to activate a Will Call pickup, or if your transportation is late for a scheduled pickup. Use this number, too, if you need to make a change or cancel a previously scheduled reservation.  If you need transportation services right away, call [phone]. The after-hours call center is staffed 24 hours to handle ride assistance and urgent reservation requests (including discharges) 365 days a year. Urgent trips include sick visits, hospital discharge requests and life-sustaining treatment.  Call the [facility] Line at [phone], at any time, 24 hours a day, 7 days a week. If you are in danger or need immediate medical attention call 911.   Patient verbalizes understanding of instructions and care plan provided today and agrees to view in MyChart. Active MyChart status and patient understanding of how to access instructions and care plan via MyChart confirmed with patient.     Telephone follow up appointment with Managed Medicaid care management team member scheduled for: 07/06/24 at 2:30 PM  Rosaline Finlay, RN MSN [place]  [facility] Population Health RN Care Manager Direct Dial: [phone]  Fax: [phone]   Following is a copy of your plan of care:   Goals Addressed             This Visit's Progress    [facility]BCI RN Care Plan   On track    Problems:  Chronic Disease Management support and education needs related to recent hospitalizations for recurrent right pleural effusion and right ankle  osteomyelitis  Goal: Over the next 30 days the Patient will demonstrate a decrease pleural effusion in exacerbations as evidenced by patient report of no symptoms of pleural effusion (shortness of breath, chest pain, cough) and continued compliance with prescribed medications  not experience hospital admission as evidenced by review of electronic medical record. Hospital Admissions in last 6 months = 3 work with joint specialist and pain management to determine plan of care s/p right ankle hardware removal as evidenced by review of electronic medical record and patient or care team member report   Report decreased pain as evidenced by patient report of pain rated <5/10  Interventions:   Evaluation of current treatment plan related to recurrent pleural effusion and osteomyelitis s/p hardware removal self-management and patient's adherence to plan as established by provider. Discussed plans with patient for ongoing care management follow up and provided patient with direct contact information for care management team Evaluation of current treatment plan related to osteomyelitis s/p hardware removal and patient's adherence to plan as established by provider Reviewed medications with patient and discussed importance of medication compliance, confirmed patient has made medication changes as prescribed by liver specialist Reviewed scheduled/upcoming provider appointments including GI 07/05/24 Screening for signs and symptoms of depression related to chronic disease state  Reviewed outstanding referrals for pain management and sports medicine. Patient reports he is going to start calling offices after the new year if he has not heard from them yet Ensured patient has not had any more difficulties with DME company regarding hospital bed  Patient Self-Care Activities:  Attend all scheduled  provider appointments Call provider office for new concerns or questions  Perform all self care activities  independently  Take medications as prescribed    Plan:  Telephone follow up appointment with care management team member scheduled for:  07/06/24 at 2:30 PM

## 2024-06-08 ENCOUNTER — Ambulatory Visit
Admission: RE | Admit: 2024-06-08 | Discharge: 2024-06-08 | Disposition: A | Discharge status: Home / Self Care | Source: Ambulatory Visit | Attending: Nurse Practitioner | Admitting: Nurse Practitioner

## 2024-06-08 DIAGNOSIS — K7030 Alcoholic cirrhosis of liver without ascites: Secondary | ICD-10-CM

## 2024-06-08 DIAGNOSIS — I8510 Secondary esophageal varices without bleeding: Secondary | ICD-10-CM

## 2024-06-23 ENCOUNTER — Ambulatory Visit: Payer: Self-pay | Admitting: Internal Medicine

## 2024-07-05 ENCOUNTER — Ambulatory Visit: Admitting: Gastroenterology

## 2024-07-06 ENCOUNTER — Other Ambulatory Visit: Payer: Self-pay

## 2024-07-06 NOTE — Patient Outreach (Signed)
 Complex Care Management   Visit Note  07/06/2024  Name:  Thomas [name] MRN: [id number] DOB: [birth year]  Situation: Referral received for Complex Care Management related to recent hospitalizations for right ankle infection s/p hardware removal and recurrent pleural effusions I obtained verbal consent from Patient.  Visit completed with Patient  on the phone  Background:   Past Medical History:  Diagnosis Date   Acute conjunctivitis of both eyes 10/22/2021   Acute sinusitis 06/16/2013   Alcoholic cirrhosis (HCC)    Alcoholic fatty liver 01/16/2010   Needs final HBV and HAV vaccines on or after 10/25/2012    Alcoholism (HCC) 12/25/2011   Allergic rhinitis    Childhood asthma    Elevated transaminase level 06/10/2007   AST: 80 ALT: 136 in 8/11: Hepatitis A., B and C negative.    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    Sleep apnea     Assessment: Patient Reported Symptoms:  Cognitive Cognitive Status: Able to follow simple commands, Alert and oriented to person, place, and time, Normal speech and language skills Cognitive/Intellectual Conditions Management [RPT]: None reported or documented in medical history or problem list   Health Maintenance Behaviors: Annual physical exam, Stress management Health Facilitated by: Healthy diet, Pain control, Rest, Stress management  Neurological Neurological Review of Symptoms: Not assessed    HEENT HEENT Symptoms Reported: Not assessed      Cardiovascular Cardiovascular Symptoms Reported: Not assessed    Respiratory Respiratory Symptoms Reported: No symptoms reported Respiratory Management Strategies: Routine screening  Endocrine Endocrine Symptoms Reported: No symptoms reported Is patient diabetic?: No    Gastrointestinal Gastrointestinal  Symptoms Reported: No symptoms reported Additional Gastrointestinal Details: Patient reports he continues to have regular BMs without the use of lactulose  Gastrointestinal Management Strategies: Coping strategies, Diet modification Gastrointestinal Comment: Note per chart review GI appointment was rescheduled due to weather.    Genitourinary Genitourinary Symptoms Reported: Frequency, Urgency Additional Genitourinary Details: Frequency and urgency due to diuretics Genitourinary Management Strategies: Coping strategies  Integumentary Integumentary Symptoms Reported: Itching Additional Integumentary Details: Itching due to psoriasis, primary on the legs and elbow. Patient reports he has follow-up with rheumatology next week Skin Management Strategies: Routine screening  Musculoskeletal Musculoskelatal Symptoms Reviewed: Joint pain, Unsteady gait, Other Other Musculoskeletal Symptoms: Patient reports increased difficulty going up the stairs, which he has to do in order to take a shower Additional Musculoskeletal Details: Patient reports he had visit with pain management, but they weren't able to do much for him. Patient reports he does not want to continue Oxycodone , which is what they recommended. Patient reports he will continue to follow with pain management and is waiting for follow-up to be scheduled. Patient reports he did not hear from [facility], but did have an appointment with a provider that is making a custom boot for him. Patient reports he is waiting for boot to be ready for pickup. Musculoskeletal Management Strategies: Adequate rest, Medical device, Medication therapy, Routine screening Frieda) Musculoskeletal Comment: Patient denies falls since previous CMRN visit. Falls in the past year?: No Number of falls in past year: 1 or less Was there an injury with Fall?: No Fall Risk Category Calculator: 0 Patient Fall Risk Level: Low Fall Risk Patient at Risk for Falls Due  to: Impaired balance/gait, Orthopedic patient  Fall risk Follow up: Falls evaluation completed, Education provided, Falls prevention discussed  Psychosocial Psychosocial Symptoms Reported: Sadness - if selected complete PHQ 2-9 Additional Psychological Details: Patient reports mental health better. Patient reports an improved relationship with his sister Behavioral Management Strategies: Abstinence from substances, Coping strategies, Support system (Friends) Major Change/Loss/Stressor/Fears (CP): Death of a loved one, Medical condition, self Techniques to [employer] with Loss/Stress/Change: Diversional activities Quality of Family Relationships: involved Do you feel physically threatened by others?: No    07/06/2024    PHQ2-9 Depression Screening   Little interest or pleasure in doing things Not at all  Feeling down, depressed, or hopeless Several days  PHQ-2 - Total Score 1  Trouble falling or staying asleep, or sleeping too much    Feeling tired or having little energy    Poor appetite or overeating     Feeling bad about yourself - or that you are a failure or have let yourself or your family down    Trouble concentrating on things, such as reading the newspaper or watching television    Moving or speaking so slowly that other people could have noticed.  Or the opposite - being so fidgety or restless that you have been moving around a lot more than usual    Thoughts that you would be better off dead, or hurting yourself in some way    PHQ2-9 Total Score    If you checked off any problems, how difficult have these problems made it for you to do your work, take care of things at home, or get along with other people    Depression Interventions/Treatment      There were no vitals filed for this visit. Pain Scale: 0-10 Pain Score: 5  Pain Type: Chronic pain, Surgical pain Pain Location: Ankle Pain Orientation: Right  Medications Reviewed Today     Reviewed by Arno Rosaline SQUIBB, RN  (Registered Nurse) on 07/06/24 at 1443  Med List Status: <None>   Medication Order Taking? Sig Documenting Provider Last Dose Status Informant  acetaminophen  (TYLENOL ) 325 MG tablet [id number]  Take 325-650 mg by mouth every 6 (six) hours as needed for mild pain (pain score 1-3) (or headaches). [provider]  Active Self  albuterol  (VENTOLIN  HFA) 108 (90 Base) MCG/ACT inhaler [id number]  Inhale 1-2 puffs into the lungs every 6 (six) hours as needed for wheezing or shortness of breath. Simon Lavonia SAILOR, MD  Active Self  ARTIFICIAL TEARS 1 % ophthalmic solution [id number]  Place 1 drop into both eyes 3 (three) times daily as needed (for dryness). [provider]  Active Self  b complex vitamins capsule [id number]  Take 1 capsule by mouth daily with lunch. [provider]  Active Self  carvedilol (COREG) 3.125 MG tablet [id number]  Take 3.125 mg by mouth. [provider]  Active   cefadroxil  (DURICEF) 500 MG capsule [id number]  Take 2 capsules (1,000 mg total) by mouth 2 (two) times daily. [name], MD  Active   Cholecalciferol (VITAMIN D -3 PO) [id number]  Take 1 capsule by mouth daily with lunch. [provider]  Active Self           Med Note [name], [name]   Tue Apr 05, 2024  6:29 PM) Strength not recalled  clobetasol  cream (TEMOVATE ) 0.05 % [id number]  Apply topically 2 (two) times daily. Apply to Psoriatic plaques on the extremities, trunk or scalp. DO not apply to face, intertriginous areas (armpits, groin, under pannus, gluteal cleft), or genitals.  Tobie Yetta HERO, MD  Active Self           Med Note [name], [name]   Tue Apr 05, 2024  4:14 PM)    Cyanocobalamin  (VITAMIN B12 PO) [id number]  Take 1 tablet by mouth daily with lunch. [provider]  Active Self           Med Note [name], [name]   Tue Apr 05, 2024  6:29 PM) Strength not recalled  eplerenone (INSPRA) 50 MG tablet [id number]  Take 50 mg by mouth daily. [provider]  Active    folic acid  (FOLVITE ) 1 MG tablet [id number]  Take 1 tablet (1 mg total) by mouth daily with lunch. Jeannetta Lonni ORN, MD  Active   furosemide  (LASIX ) 80 MG tablet [id number]  Take 1 tablet (80 mg total) by mouth daily. McElwee, Lauren A, NP  Active   HYDROcodone-acetaminophen  (NORCO/VICODIN) 5-325 MG tablet [id number]  Take 1 tablet by mouth every 6 (six) hours as needed for moderate pain (pain score 4-6).  Patient not taking: Reported on 05/26/2024   [provider]  Active            Med Note LESLY RICHERD CINDERELLA Pablo May 09, 2024  1:19 PM) Given by ortho for pain on 05/04/24  lactulose  (CHRONULAC ) 10 GM/15ML solution [id number]  Take 30 mLs (20 g total) by mouth 3 (three) times daily.  Patient not taking: Reported on 05/26/2024   Jillian Buttery, MD  Active   methocarbamol  (ROBAXIN ) 750 MG tablet [id number]  Take 1 tablet (750 mg total) by mouth every 6 (six) hours as needed for muscle spasms. Berneta Elsie Sayre, MD  Active   nadolol  (CORGARD ) 20 MG tablet [id number]  Take 1 tablet (20 mg total) by mouth daily.  Patient not taking: Reported on 06/07/2024   Jillian Buttery, MD  Active   pantoprazole  (PROTONIX ) 40 MG tablet [id number]  Take 1 tablet (40 mg total) by mouth 2 (two) times daily. Berneta Elsie Sayre, MD  Active   Secukinumab  (COSENTYX  UNOREADY) 300 MG/2ML EMMANUEL [id number]  Inject 300mg  into the skin at [facility] 0, 1, 2, 3  Patient not taking: Reported on 05/26/2024   Jeannetta Lonni ORN, MD  Active Self           Med Note [name], [name]   Tue Apr 05, 2024  4:15 PM)    spironolactone  (ALDACTONE ) 100 MG tablet [id number]  Take 1 tablet (100 mg total) by mouth daily.  Patient not taking: Reported on 06/07/2024   Jillian Buttery, MD  Active   Zinc 50 MG TABS [id number]  Take 50 mg by mouth daily with lunch. [provider]  Active Self            Recommendation:   DME requests:  other stair lift Continue Current Plan of Care  Follow Up Plan:   Telephone  follow up appointment date/time:  08/03/24 at 2:30 PM  Rosaline Finlay, RN MSN [place]  [facility] RN Care Manager Direct Dial: [phone]  Fax: [phone]

## 2024-07-06 NOTE — Patient Instructions (Signed)
 Visit Information  Thomas [name] was given information about Medicaid Managed Care team care coordination services as a part of their [employer] benefit. MCDANIEL OHMS   If you would like to schedule transportation through your [facility] plan, please call the following number at least 2 days in advance of your appointment: [phone]  For information about your ride after you set it up, call Ride Assist at [phone]. Use this number to activate a Will Call pickup, or if your transportation is late for a scheduled pickup. Use this number, too, if you need to make a change or cancel a previously scheduled reservation.  If you need transportation services right away, call [phone]. The after-hours call center is staffed 24 hours to handle ride assistance and urgent reservation requests (including discharges) 365 days a year. Urgent trips include sick visits, hospital discharge requests and life-sustaining treatment.  Call the [facility] Line at [phone], at any time, 24 hours a day, 7 days a week. If you are in danger or need immediate medical attention call 911.   Patient verbalizes understanding of instructions and care plan provided today and agrees to view in MyChart. Active MyChart status and patient understanding of how to access instructions and care plan via MyChart confirmed with patient.     Telephone follow up appointment with Managed Medicaid care management team member scheduled for: 08/03/24 at 2:30 PM  Rosaline Finlay, RN MSN [place]  [facility]BC[employer] Population Health RN Care Manager Direct Dial: [phone]  Fax: [phone]   Following is a copy of your plan of care:   Goals Addressed             This Visit's Progress    VBCI RN Care Plan   On track    Problems:  Chronic Disease Management support and education needs related to recent hospitalizations for recurrent right pleural effusion and right ankle  osteomyelitis  Goal: Over the next 30 days the Patient will demonstrate a decrease pleural effusion in exacerbations as evidenced by patient report of no symptoms of pleural effusion (shortness of breath, chest pain, cough) and continued compliance with prescribed medications  not experience hospital admission as evidenced by review of electronic medical record. Hospital Admissions in last 6 months = 3 work with joint specialist and pain management to determine plan of care s/p right ankle hardware removal as evidenced by review of electronic medical record and patient or care team member report   Report decreased pain as evidenced by patient report of pain rated <5/10  Interventions:   Evaluation of current treatment plan related to recurrent pleural effusion and osteomyelitis s/p hardware removal self-management and patient's adherence to plan as established by provider. Discussed plans with patient for ongoing care management follow up and provided patient with direct contact information for care management team Evaluation of current treatment plan related to osteomyelitis s/p hardware removal and patient's adherence to plan as established by provider Reviewed scheduled/upcoming provider appointments including GI 07/29/24 Screening for signs and symptoms of depression related to chronic disease state  Message sent to PCP to request order for a stair lift  Patient Self-Care Activities:  Attend all scheduled provider appointments Call provider office for new concerns or questions  Perform all self care activities independently  Take medications as prescribed    Plan:  Telephone follow up appointment with care management team member scheduled for:  08/03/24 at 2:30 PM

## 2024-07-07 ENCOUNTER — Telehealth: Payer: Self-pay

## 2024-07-07 NOTE — Telephone Encounter (Signed)
" ° °  CLINICAL USE BELOW THIS LINE (use X to signify action taken)  ___ Form received and placed in providers office for signature. __X_ Form completed and faxed [facility] at [phone].  ___ Form completed & LVM to notify patient ready for pick up.  ___ Charge sheet and copy of form in front office folder for office supervisor.  "

## 2024-07-07 NOTE — Patient Outreach (Signed)
 Call placed to [facility] Dr. Barton to request order for stair lift. Per representative, she will forward request to provider. Patient has been updated and verbalizes understanding.  Rosaline Finlay, RN MSN [place]  VBCI Population Health RN Care Manager Direct Dial: [phone]  Fax: [phone]

## 2024-07-07 NOTE — Telephone Encounter (Signed)
-----   Message from Elsie Lent, MD sent at 07/07/2024 11:58 AM EST ----- Regarding: RE: Patient Update He may had more success with that request coming from Ortho. ----- Message ----- From: Arno Rosaline SQUIBB, RN Sent: 07/06/2024   3:09 PM EST To: Elsie Sim Lent, MD Subject: Patient Update                                 Good afternoon Dr. Lent,  I had RN Care Management follow-up with patient this afternoon. He is reporting increased difficulty making it up the stairs due to right ankle injury and hardware removal. Patient reports he will have to wait at least 8-12 months to have surgery on his ankle. He is worried about this waiting period, and the recovery period in regards to going up the stairs. Is there any way to get him a stair lift?  Thank you, Rosaline Arno, RN MSN [place]  [facility] RN Care Manager Direct Dial: [phone]  Fax: [phone]

## 2024-07-13 ENCOUNTER — Ambulatory Visit: Admitting: Internal Medicine

## 2024-07-13 ENCOUNTER — Encounter: Payer: Self-pay | Admitting: Internal Medicine

## 2024-07-13 VITALS — BP 115/70 | HR 69 | Temp 98.4°F | Resp 16 | Ht 65.0 in | Wt 244.8 lb

## 2024-07-13 DIAGNOSIS — T847XXS Infection and inflammatory reaction due to other internal orthopedic prosthetic devices, implants and grafts, sequela: Secondary | ICD-10-CM

## 2024-07-13 DIAGNOSIS — L4050 Arthropathic psoriasis, unspecified: Secondary | ICD-10-CM

## 2024-07-13 DIAGNOSIS — L409 Psoriasis, unspecified: Secondary | ICD-10-CM

## 2024-07-13 DIAGNOSIS — Z111 Encounter for screening for respiratory tuberculosis: Secondary | ICD-10-CM | POA: Diagnosis not present

## 2024-07-13 DIAGNOSIS — Z79899 Other long term (current) drug therapy: Secondary | ICD-10-CM

## 2024-07-13 NOTE — Progress Notes (Unsigned)
 "  Office Visit Note  Patient: NICOLAI LABONTE             Date of Birth: 1975-09-07           MRN: [id number]             PCP: Berneta Elsie Sayre, MD Referring: Berneta Elsie Sayre, MD Visit Date: 07/13/2024   Subjective:  Medication Management (Cosentyx  ) and Pain (Ankle looking for some type of therapy for the pain or maybe injections , there is no cartilage an its bone on bone)   History of Present Illness: MAKAVELI HOARD is a 49 y.o. male here for follow up ***   Discussed the use of AI scribe software for clinical note transcription with the patient, who gave verbal consent to proceed.  History of Present Illness   Couper Juncaj is a 49 year old male with psoriatic arthritis who presents with persistent right ankle pain and limited mobility.  He experiences ongoing pain in his right ankle, described as very painful, especially when walking. There is very limited cartilage between the bones, causing significant discomfort. He can put weight on the ankle, but it is painful to walk, and he avoids doing so. The ankle remains slightly puffy after the removal of a metal plate that was causing inflammation.  He is completing a course of antibiotics, expected to finish tomorrow, following an infection related to the metal plate in his ankle. He was previously on a PICC line and is now on oral antibiotics. The infection seems resolved, and he is awaiting clearance to resume Cosentyx  injections for his psoriatic arthritis.  Pain is primarily in the right ankle, with no significant pain or swelling elsewhere recently. He mentions previous pain in his elbows and spreading psoriasis, particularly on his elbow and leg.  He is currently using tramadol  for pain management, with limited pills remaining, and a muscle relaxer taken at night. He describes significant pain when performing activities such as climbing stairs to shower, which requires him to take a muscle relaxer to manage the  pain.  He is awaiting a custom-made cast for his leg, expected to arrive this month.  He has a history of elevated inflammation markers due to the ankle infection, which have since normalized after discontinuing medications like prednisone  and oxycodone . His liver and kidney functions were affected during his hospital stay but have since returned to normal.       Previous HPI 02/29/24 Briscoe Daniello Kenner Lewan is a 50 year old male with psoriasis who presents with joint inflammation and swelling.   Years ago, he experienced joint pain in his fingers and elbows, initially suspecting gout. Tests ruled out gout and revealed psoriasis affecting his joints. He was treated with Skyrizi for over a year, which alleviated pain but did not improve itching or skin patches. He discontinued Skyrizi after his dermatologist closed his practice and never got established elsewhere.   Approximately a year ago, he broke his ankle, which was rehabilitated successfully, allowing him to resume activities like dancing and golfing. However, a month ago, during a stressful period when his father was hospitalized, he experienced a psoriasis flare-up with associated leg swelling. He resumed Lasix , which reduced swelling except in the ankle. The swelling worsened, leading to an inability to walk.   He sought urgent care, receiving a cortisone shot and a prednisone  pack, which provided temporary relief. 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 He received Solu-Medrol  and prednisone , improving his mobility partially. He completed the prednisone  pack but is unsure about further medication use.   His psoriasis primarily affects his legs, with rashes localized there. No rash on his scalp and no issues with bruising easily. He has never experienced fluid retention before and  questions if recent inactivity contributed to this issue.       Labs reviewed 2025 HAV Ab pos HBV Ab neg   08/2021 Quantiferon neg HCV Ab neg   Review of Systems  Constitutional:  Negative for fatigue.  HENT:  Negative for mouth sores and mouth dryness.   Eyes:  Negative for dryness.  Respiratory:  Negative for shortness of breath.   Cardiovascular:  Negative for chest pain and palpitations.  Gastrointestinal:  Negative for blood in stool, constipation and diarrhea.  Endocrine: Negative for increased urination.  Genitourinary:  Negative for involuntary urination.  Musculoskeletal:  Positive for joint pain and joint pain. Negative for gait problem, joint swelling, myalgias, muscle weakness, morning stiffness, muscle tenderness and myalgias.  Skin:  Negative for color change, rash, hair loss and sensitivity to sunlight.  Allergic/Immunologic: Negative for susceptible to infections.  Neurological:  Negative for dizziness and headaches.  Hematological:  Negative for swollen glands.  Psychiatric/Behavioral:  Positive for depressed mood. Negative for sleep disturbance. The patient is not nervous/anxious.     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   Wound infection complicating hardware, initial encounter 03/20/2024   High risk medication use 02/29/2024   Pleural effusion associated with hepatic disorder 02/20/2024   Other ascites 06/18/2023   Cellulitis of abdominal wall 03/10/2023   Esophageal varices without bleeding (HCC) 02/13/2023   History of ETOH abuse 01/09/2023   Ankle fracture 01/07/2023   Localized edema 01/07/2023   Closed right ankle fracture 01/06/2023   Thrombocytopenia 01/06/2023   Cirrhosis of liver (HCC) 12/25/2022   Secondary  esophageal varices with bleeding (HCC) 12/25/2022   Reactive airway disease 07/28/2022   Acute upper GI bleed 07/05/2022   Hematemesis with nausea 07/05/2022   Melena 07/05/2022   Pharyngitis 10/22/2021   Psoriatic arthritis (HCC) 10/03/2021   Viral upper respiratory tract infection 10/03/2021   Acute blood loss anemia 08/26/2021   Screening for tuberculosis 08/15/2021   Elevated BP without diagnosis of hypertension 06/14/2021   Need for immunization against influenza 06/14/2021   Alcoholic cirrhosis of liver (HCC) 09/26/2019   Class 3 severe obesity due to excess calories with body mass index (BMI) of 50.0 to 59.9 in adult [facility]) 09/26/2019   Healthcare maintenance 02/07/2019   Psoriasis 02/07/2019   Vitamin D  deficiency 05/07/2016   OSA (obstructive sleep apnea) 06/23/2013   Insomnia 06/16/2013   Alcohol  use disorder 12/25/2011   Scrotal varices, 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    Gastroenteritis 08/26/2021   Hand pain, left 07/28/2022   Hordeolum externum of right upper eyelid 08/14/2022   IDA (iron deficiency anemia)    Morbid obesity (HCC)    Scrotal varices 01/07/2010   Followed at [facility] urology.    Sleep apnea     Family History  Problem Relation Age of Onset   Liver disease Mother    Depression Mother    Liver disease Father    Diabetes Father    Hypertension Father    Liver disease Sister    Diabetes Other    Colon cancer Neg Hx    Esophageal cancer Neg Hx    Stomach cancer Neg Hx    Colon polyps  Neg Hx    Past Surgical History:  Procedure Laterality Date   ESOPHAGEAL BANDING  12/26/2022   Procedure: ESOPHAGEAL BANDING;  Surgeon: Shila Gustav GAILS, MD;  Location: [facility];  Service: Gastroenterology;;   ESOPHAGOGASTRODUODENOSCOPY (EGD) WITH PROPOFOL  N/A 07/05/2022   Procedure: ESOPHAGOGASTRODUODENOSCOPY (EGD) WITH PROPOFOL ;  Surgeon: Shila Gustav GAILS, MD;  Location: [facility];  Service: Gastroenterology;  Laterality: N/A;   ESOPHAGOGASTRODUODENOSCOPY (EGD) WITH PROPOFOL  N/A 12/26/2022   Procedure: ESOPHAGOGASTRODUODENOSCOPY (EGD) WITH PROPOFOL ;  Surgeon: Shila Gustav GAILS, MD;  Location: [facility];  Service: Gastroenterology;  Laterality: N/A;   ESOPHAGOGASTRODUODENOSCOPY (EGD) WITH PROPOFOL  N/A 02/12/2023   Procedure: ESOPHAGOGASTRODUODENOSCOPY (EGD) WITH PROPOFOL ;  Surgeon: Shila Gustav GAILS, MD;  Location: [facility];  Service: Gastroenterology;  Laterality: N/A;   INCISION AND DRAINAGE OF DEEP ABSCESS, ANKLE Right 03/21/2024   Procedure: INCISION AND DRAINAGE OF DEEP ABSCESS, ANKLE;  Surgeon: Edna Toribio LABOR, MD;  Location: [facility] ORS;  Service: Orthopedics;  Laterality: Right;   IR THORACENTESIS RIGHT ASP PLEURAL SPACE W/IMG GUIDE  04/05/2024   ORIF ANKLE FRACTURE Right 12/28/2022   Procedure: OPEN REDUCTION INTERNAL FIXATION (ORIF) ANKLE FRACTURE;  Surgeon: Josefina Chew, MD;  Location: [facility] OR;  Service: Orthopedics;  Laterality: Right;   Social History   Social History Narrative   Manufacturing Systems Engineer.  Single. Gets regular exercise.             Immunization History  Administered Date(s) Administered   Fluzone  Influenza virus vaccine,trivalent (IIV3), split virus 05/06/2016   Hepatitis A 04/27/2012   Hepatitis A, Adult 03/15/2019, 10/11/2019   Hepatitis B 04/27/2012, 06/07/2012   Hepatitis B, ADULT 04/27/2012, 06/07/2012, 03/15/2019, 08/17/2019, 10/11/2019   Hepb-cpg 03/24/2024, 04/28/2024   Influenza, Seasonal, Injecte, Preservative Fre  03/10/2023, 04/06/2024   Influenza,inj,Quad PF,6+ Mos 05/06/2016, 08/05/2018, 02/07/2019, 06/14/2021, 05/13/2022   PFIZER(Purple Top)SARS-COV-2 Vaccination 08/17/2019, 09/01/2019, 09/19/2019   Pneumococcal Polysaccharide-23 05/06/2016   Tdap 04/27/2012, 05/13/2022     Objective: Vital Signs: BP 115/70   Pulse 69   Temp 98.4 F (36.9 C)   Resp 16   Ht 5' 5 (1.651 m)   Wt 244 lb 12 oz (111 kg)   BMI 40.73 kg/m    Physical Exam   Musculoskeletal Exam: ***  Right ankle crepitus, painful, surgical scar laterally  Investigation: No additional findings.  Imaging: No results found.  Recent Labs: Lab Results  Component Value Date   WBC 6.1 04/15/2024   HGB 11.2 (L) 04/15/2024   PLT 150.0 04/15/2024   NA 137 04/15/2024   K 3.9 04/15/2024   CL 98 04/15/2024   CO2 32 04/15/2024   GLUCOSE 79 04/15/2024   BUN 21 04/15/2024  CREATININE 0.77 04/15/2024   BILITOT 3.5 (H) 04/15/2024   ALKPHOS 208 (H) 04/15/2024   AST 73 (H) 04/15/2024   ALT 9 04/15/2024   PROT 6.4 04/15/2024   ALBUMIN  2.3 (L) 04/15/2024   CALCIUM 8.2 (L) 04/15/2024   GFRAA >89 06/22/2013   QFTBGOLDPLUS INDETERMINATE (A) 02/29/2024    Speciality Comments: No specialty comments available.  Procedures:  No procedures performed Allergies: Oxycodone , Codeine, and Other   Assessment / Plan:     Visit Diagnoses:  Assessment & Plan   ***  Assessment and Plan    Psoriatic arthritis Chronic psoriatic arthritis with significant right ankle pain due to limited cartilage. Recent infection resolved with antibiotics. Surgery for ankle fusion planned in 6-12 months, currently not feasible due to infection risk. Pain management challenging due to limited mobility and potential muscle weakening. Hesitant about surgery due to potential loss of mobility and persistent pain. - Hold Cosentyx  injections until completion of antibiotics. - Use tramadol  and muscle relaxers for pain management as needed. - Await rigid  boot for ankle stabilization. - Follow up with orthopedic specialist for nonoperative management and surgical planning.  Psoriasis Recent spread to elbows and legs. Previous indeterminate tuberculosis screening likely due to negative control sample. Annual tuberculosis screening recommended due to Cosentyx  treatment. Updated on immunizations, including hepatitis B. - Recheck tuberculosis screening test. - Restart Cosentyx  injections after completion of antibiotics and confirmation of infection resolution.        Follow-Up Instructions: No follow-ups on file.   Lonni LELON Ester, MD  Note - This record has been created using [employer].  Chart creation errors have been sought, but may not always  have been located. Such creation errors do not reflect on  the standard of medical care. "

## 2024-07-14 ENCOUNTER — Encounter: Payer: Self-pay | Admitting: Internal Medicine

## 2024-07-14 ENCOUNTER — Other Ambulatory Visit (HOSPITAL_COMMUNITY): Payer: Self-pay

## 2024-07-14 ENCOUNTER — Ambulatory Visit: Admitting: Internal Medicine

## 2024-07-14 MED ORDER — COSENTYX UNOREADY 300 MG/2ML ~~LOC~~ SOAJ: 300.0000 mg | SUBCUTANEOUS | 0 refills | Status: AC

## 2024-07-14 MED ORDER — CLOBETASOL PROPIONATE 0.05 % EX CREA: TOPICAL_CREAM | Freq: Two times a day (BID) | CUTANEOUS | 0 refills | Status: AC

## 2024-07-14 NOTE — Assessment & Plan Note (Addendum)
 Chronic psoriatic arthritis with significant right ankle pain due to limited cartilage. Recent infection resolved with antibiotics. Surgery for ankle fusion planned in 6-12 months, currently not feasible due to infection risk. Pain management challenging due to limited mobility and potential muscle weakening. Hesitant about surgery due to potential loss of mobility and persistent pain. - Rechecking sed rate and CRP for inflammatory disease monitoring - Hold Cosentyx  injections until completion of antibiotics- then resume 300 mg [place] monthly. - Use tramadol  and muscle relaxers for pain management as needed. - Await rigid boot for ankle stabilization. - Follow up with orthopedic specialist for nonoperative management and surgical planning. Orders:   Sedimentation rate   C-reactive protein   Secukinumab  (COSENTYX  UNOREADY) 300 MG/2ML SOAJ; Inject 300 mg into the skin every 30 (thirty) days.

## 2024-07-14 NOTE — Assessment & Plan Note (Addendum)
 Finishing oral antibiotics course for this. Now has crepitus and severe pain on weightbearing and with surgical plate removed maybe instability.

## 2024-07-14 NOTE — Assessment & Plan Note (Addendum)
 revious indeterminate tuberculosis screening likely due to negative control sample. Annual tuberculosis screening recommended due to Cosentyx  treatment. I would not expect high risk of complication with IL-17 blockade with wound closed and already prolonged antibiotics. - Checking CBC and CMP for medication monitoring on Cosentyx  - Recheck tuberculosis screening test.  Orders:   CBC with Differential/Platelet   Comprehensive metabolic panel with GFR   QuantiFERON-TB Gold Plus   Secukinumab  (COSENTYX  UNOREADY) 300 MG/2ML SOAJ; Inject 300 mg into the skin every 30 (thirty) days.

## 2024-07-14 NOTE — Assessment & Plan Note (Addendum)
 Recent spread to elbows and legs. P- Restart Cosentyx  injections after completion of antibiotics and confirmation of infection resolution.  - Reorder topical clobetasol  cream for active plaques Orders:   Secukinumab  (COSENTYX  UNOREADY) 300 MG/2ML SOAJ; Inject 300 mg into the skin every 30 (thirty) days.   clobetasol  cream (TEMOVATE ) 0.05 %; Apply topically 2 (two) times daily. Apply to Psoriatic plaques on the extremities, trunk or scalp. DO not apply to face, intertriginous areas (armpits, groin, under pannus, gluteal cleft), or genitals.

## 2024-07-15 LAB — COMPREHENSIVE METABOLIC PANEL WITH GFR
AG Ratio: 0.7 (calc) — ABNORMAL LOW (ref 1.0–2.5)
ALT: 24 U/L (ref 9–46)
AST: 55 U/L — ABNORMAL HIGH (ref 10–40)
Albumin: 2.7 g/dL — ABNORMAL LOW (ref 3.6–5.1)
Alkaline phosphatase (APISO): 147 U/L — ABNORMAL HIGH (ref 36–130)
BUN: 10 mg/dL (ref 7–25)
CO2: 31 mmol/L (ref 20–32)
Calcium: 8.6 mg/dL (ref 8.6–10.3)
Chloride: 106 mmol/L (ref 98–110)
Creat: 0.6 mg/dL (ref 0.60–1.29)
Globulin: 3.7 g/dL (ref 1.9–3.7)
Glucose, Bld: 65 mg/dL (ref 65–99)
Potassium: 4.1 mmol/L (ref 3.5–5.3)
Sodium: 141 mmol/L (ref 135–146)
Total Bilirubin: 2.6 mg/dL — ABNORMAL HIGH (ref 0.2–1.2)
Total Protein: 6.4 g/dL (ref 6.1–8.1)
eGFR: 121 mL/min/{1.73_m2}

## 2024-07-15 LAB — CBC WITH DIFFERENTIAL/PLATELET
Absolute Lymphocytes: 900 {cells}/uL (ref 850–3900)
Absolute Monocytes: 480 {cells}/uL (ref 200–950)
Basophils Absolute: 60 {cells}/uL (ref 0–200)
Basophils Relative: 1.5 %
Eosinophils Absolute: 860 {cells}/uL — ABNORMAL HIGH (ref 15–500)
Eosinophils Relative: 21.5 %
HCT: 32.8 % — ABNORMAL LOW (ref 39.4–51.1)
Hemoglobin: 10.6 g/dL — ABNORMAL LOW (ref 13.2–17.1)
MCH: 28 pg (ref 27.0–33.0)
MCHC: 32.3 g/dL (ref 31.6–35.4)
MCV: 86.8 fL (ref 81.4–101.7)
MPV: 10 fL (ref 7.5–12.5)
Monocytes Relative: 12 %
Neutro Abs: 1700 {cells}/uL (ref 1500–7800)
Neutrophils Relative %: 42.5 %
Platelets: 109 10*3/uL — ABNORMAL LOW (ref 140–400)
RBC: 3.78 Million/uL — ABNORMAL LOW (ref 4.20–5.80)
RDW: 15.4 % — ABNORMAL HIGH (ref 11.0–15.0)
Total Lymphocyte: 22.5 %
WBC: 4 10*3/uL (ref 3.8–10.8)

## 2024-07-15 LAB — QUANTIFERON-TB GOLD PLUS
Mitogen-NIL: 1.54 [IU]/mL
NIL: 0.02 [IU]/mL
QuantiFERON-TB Gold Plus: NEGATIVE
TB1-NIL: 0 [IU]/mL
TB2-NIL: 0 [IU]/mL

## 2024-07-15 LAB — SEDIMENTATION RATE: Sed Rate: 22 mm/h — ABNORMAL HIGH (ref 0–15)

## 2024-07-15 LAB — C-REACTIVE PROTEIN: CRP: 8 mg/L — ABNORMAL HIGH

## 2024-07-28 ENCOUNTER — Ambulatory Visit: Admitting: Family Medicine

## 2024-07-29 ENCOUNTER — Ambulatory Visit: Admitting: Gastroenterology

## 2024-08-03 ENCOUNTER — Telehealth

## 2024-08-11 ENCOUNTER — Ambulatory Visit: Admitting: Internal Medicine

## 2024-10-12 ENCOUNTER — Ambulatory Visit: Admitting: Internal Medicine
